# Patient Record
Sex: Female | Born: 1965 | Race: White | Hispanic: No | Marital: Married | Smoking: Current every day smoker
Health system: Southern US, Community
[De-identification: ages and names within clinical notes are randomized; demographics above are authoritative.]

## PROBLEM LIST (undated history)

## (undated) DIAGNOSIS — D649 Anemia, unspecified: Secondary | ICD-10-CM

## (undated) DIAGNOSIS — E079 Disorder of thyroid, unspecified: Secondary | ICD-10-CM

## (undated) DIAGNOSIS — F32A Depression, unspecified: Secondary | ICD-10-CM

## (undated) DIAGNOSIS — I456 Pre-excitation syndrome: Secondary | ICD-10-CM

## (undated) DIAGNOSIS — K589 Irritable bowel syndrome without diarrhea: Secondary | ICD-10-CM

## (undated) DIAGNOSIS — K529 Noninfective gastroenteritis and colitis, unspecified: Secondary | ICD-10-CM

## (undated) DIAGNOSIS — F329 Major depressive disorder, single episode, unspecified: Secondary | ICD-10-CM

## (undated) DIAGNOSIS — M549 Dorsalgia, unspecified: Secondary | ICD-10-CM

## (undated) DIAGNOSIS — E271 Primary adrenocortical insufficiency: Secondary | ICD-10-CM

## (undated) DIAGNOSIS — N200 Calculus of kidney: Secondary | ICD-10-CM

## (undated) DIAGNOSIS — G8929 Other chronic pain: Secondary | ICD-10-CM

## (undated) DIAGNOSIS — Z765 Malingerer [conscious simulation]: Secondary | ICD-10-CM

## (undated) DIAGNOSIS — K859 Acute pancreatitis without necrosis or infection, unspecified: Secondary | ICD-10-CM

## (undated) DIAGNOSIS — A0472 Enterocolitis due to Clostridium difficile, not specified as recurrent: Secondary | ICD-10-CM

## (undated) DIAGNOSIS — F419 Anxiety disorder, unspecified: Secondary | ICD-10-CM

## (undated) DIAGNOSIS — K52831 Collagenous colitis: Secondary | ICD-10-CM

## (undated) DIAGNOSIS — E876 Hypokalemia: Secondary | ICD-10-CM

## (undated) HISTORY — PX: TUBAL LIGATION: SHX77

## (undated) HISTORY — PX: SPHINCTEROTOMY: SHX5279

## (undated) HISTORY — PX: LITHOTRIPSY: SUR834

## (undated) HISTORY — PX: CHOLECYSTECTOMY: SHX55

## (undated) HISTORY — PX: CARDIAC ELECTROPHYSIOLOGY MAPPING AND ABLATION: SHX1292

---

## 2008-04-18 ENCOUNTER — Emergency Department (HOSPITAL_BASED_OUTPATIENT_CLINIC_OR_DEPARTMENT_OTHER): Admission: EM | Admit: 2008-04-18 | Discharge: 2008-04-18 | Payer: Self-pay | Admitting: Emergency Medicine

## 2008-04-18 ENCOUNTER — Ambulatory Visit: Payer: Self-pay | Admitting: Diagnostic Radiology

## 2008-04-28 ENCOUNTER — Ambulatory Visit (HOSPITAL_COMMUNITY): Admission: RE | Admit: 2008-04-28 | Discharge: 2008-04-28 | Payer: Self-pay | Admitting: Urology

## 2008-05-25 ENCOUNTER — Ambulatory Visit: Payer: Self-pay | Admitting: Diagnostic Radiology

## 2008-05-25 ENCOUNTER — Emergency Department (HOSPITAL_BASED_OUTPATIENT_CLINIC_OR_DEPARTMENT_OTHER): Admission: EM | Admit: 2008-05-25 | Discharge: 2008-05-25 | Payer: Self-pay | Admitting: Emergency Medicine

## 2008-07-17 ENCOUNTER — Ambulatory Visit (HOSPITAL_BASED_OUTPATIENT_CLINIC_OR_DEPARTMENT_OTHER): Admission: RE | Admit: 2008-07-17 | Discharge: 2008-07-17 | Payer: Self-pay | Admitting: Urology

## 2008-08-03 ENCOUNTER — Emergency Department (HOSPITAL_BASED_OUTPATIENT_CLINIC_OR_DEPARTMENT_OTHER): Admission: EM | Admit: 2008-08-03 | Discharge: 2008-08-03 | Payer: Self-pay | Admitting: Emergency Medicine

## 2008-08-03 ENCOUNTER — Ambulatory Visit: Payer: Self-pay | Admitting: Radiology

## 2008-08-20 ENCOUNTER — Encounter (INDEPENDENT_AMBULATORY_CARE_PROVIDER_SITE_OTHER): Payer: Self-pay | Admitting: General Surgery

## 2008-08-20 ENCOUNTER — Ambulatory Visit (HOSPITAL_COMMUNITY): Admission: RE | Admit: 2008-08-20 | Discharge: 2008-08-22 | Payer: Self-pay | Admitting: General Surgery

## 2008-08-24 ENCOUNTER — Ambulatory Visit: Payer: Self-pay | Admitting: Radiology

## 2008-08-24 ENCOUNTER — Observation Stay (HOSPITAL_COMMUNITY): Admission: EM | Admit: 2008-08-24 | Discharge: 2008-08-26 | Payer: Self-pay | Admitting: Emergency Medicine

## 2008-08-24 ENCOUNTER — Encounter: Payer: Self-pay | Admitting: Emergency Medicine

## 2008-10-24 ENCOUNTER — Ambulatory Visit: Payer: Self-pay | Admitting: Diagnostic Radiology

## 2008-10-24 ENCOUNTER — Emergency Department (HOSPITAL_BASED_OUTPATIENT_CLINIC_OR_DEPARTMENT_OTHER): Admission: EM | Admit: 2008-10-24 | Discharge: 2008-10-24 | Payer: Self-pay | Admitting: Emergency Medicine

## 2008-10-27 ENCOUNTER — Ambulatory Visit: Payer: Self-pay | Admitting: Diagnostic Radiology

## 2008-10-27 ENCOUNTER — Emergency Department (HOSPITAL_BASED_OUTPATIENT_CLINIC_OR_DEPARTMENT_OTHER): Admission: EM | Admit: 2008-10-27 | Discharge: 2008-10-27 | Payer: Self-pay | Admitting: Emergency Medicine

## 2008-11-03 ENCOUNTER — Emergency Department (HOSPITAL_BASED_OUTPATIENT_CLINIC_OR_DEPARTMENT_OTHER): Admission: EM | Admit: 2008-11-03 | Discharge: 2008-11-03 | Payer: Self-pay | Admitting: Emergency Medicine

## 2008-11-03 ENCOUNTER — Ambulatory Visit: Payer: Self-pay | Admitting: Diagnostic Radiology

## 2008-11-30 ENCOUNTER — Emergency Department (HOSPITAL_BASED_OUTPATIENT_CLINIC_OR_DEPARTMENT_OTHER): Admission: EM | Admit: 2008-11-30 | Discharge: 2008-11-30 | Payer: Self-pay | Admitting: Emergency Medicine

## 2008-12-21 ENCOUNTER — Inpatient Hospital Stay (HOSPITAL_COMMUNITY): Admission: RE | Admit: 2008-12-21 | Discharge: 2008-12-24 | Payer: Self-pay | Admitting: Internal Medicine

## 2008-12-21 ENCOUNTER — Ambulatory Visit: Payer: Self-pay | Admitting: Diagnostic Radiology

## 2008-12-21 ENCOUNTER — Encounter: Payer: Self-pay | Admitting: Emergency Medicine

## 2009-01-08 ENCOUNTER — Ambulatory Visit: Payer: Self-pay | Admitting: Radiology

## 2009-01-08 ENCOUNTER — Emergency Department (HOSPITAL_BASED_OUTPATIENT_CLINIC_OR_DEPARTMENT_OTHER): Admission: EM | Admit: 2009-01-08 | Discharge: 2009-01-08 | Payer: Self-pay | Admitting: Emergency Medicine

## 2009-01-14 ENCOUNTER — Ambulatory Visit: Payer: Self-pay | Admitting: Family

## 2009-01-14 DIAGNOSIS — I456 Pre-excitation syndrome: Secondary | ICD-10-CM | POA: Insufficient documentation

## 2009-01-14 DIAGNOSIS — Z862 Personal history of diseases of the blood and blood-forming organs and certain disorders involving the immune mechanism: Secondary | ICD-10-CM

## 2009-01-14 DIAGNOSIS — R1013 Epigastric pain: Secondary | ICD-10-CM

## 2009-01-14 DIAGNOSIS — F411 Generalized anxiety disorder: Secondary | ICD-10-CM | POA: Insufficient documentation

## 2009-01-14 DIAGNOSIS — R634 Abnormal weight loss: Secondary | ICD-10-CM

## 2009-01-14 DIAGNOSIS — K2900 Acute gastritis without bleeding: Secondary | ICD-10-CM | POA: Insufficient documentation

## 2009-01-16 ENCOUNTER — Telehealth: Payer: Self-pay | Admitting: Family

## 2009-01-19 ENCOUNTER — Ambulatory Visit: Payer: Self-pay | Admitting: Internal Medicine

## 2009-01-20 ENCOUNTER — Emergency Department (HOSPITAL_COMMUNITY): Admission: EM | Admit: 2009-01-20 | Discharge: 2009-01-20 | Payer: Self-pay | Admitting: Emergency Medicine

## 2009-01-23 ENCOUNTER — Telehealth: Payer: Self-pay | Admitting: Family

## 2009-02-25 ENCOUNTER — Telehealth: Payer: Self-pay | Admitting: Family

## 2009-03-02 ENCOUNTER — Telehealth: Payer: Self-pay | Admitting: Family

## 2009-03-03 ENCOUNTER — Telehealth (INDEPENDENT_AMBULATORY_CARE_PROVIDER_SITE_OTHER): Payer: Self-pay | Admitting: *Deleted

## 2009-03-03 ENCOUNTER — Encounter: Payer: Self-pay | Admitting: Family

## 2009-03-05 ENCOUNTER — Encounter: Payer: Self-pay | Admitting: Family

## 2009-03-06 ENCOUNTER — Telehealth (INDEPENDENT_AMBULATORY_CARE_PROVIDER_SITE_OTHER): Payer: Self-pay | Admitting: *Deleted

## 2009-03-12 ENCOUNTER — Telehealth (INDEPENDENT_AMBULATORY_CARE_PROVIDER_SITE_OTHER): Payer: Self-pay | Admitting: *Deleted

## 2009-04-07 ENCOUNTER — Emergency Department (HOSPITAL_BASED_OUTPATIENT_CLINIC_OR_DEPARTMENT_OTHER): Admission: EM | Admit: 2009-04-07 | Discharge: 2009-04-07 | Payer: Self-pay | Admitting: Emergency Medicine

## 2009-06-04 ENCOUNTER — Telehealth (INDEPENDENT_AMBULATORY_CARE_PROVIDER_SITE_OTHER): Payer: Self-pay | Admitting: *Deleted

## 2009-10-09 ENCOUNTER — Emergency Department (HOSPITAL_BASED_OUTPATIENT_CLINIC_OR_DEPARTMENT_OTHER): Admission: EM | Admit: 2009-10-09 | Discharge: 2009-10-09 | Payer: Self-pay | Admitting: Emergency Medicine

## 2009-10-09 ENCOUNTER — Ambulatory Visit: Payer: Self-pay | Admitting: Interventional Radiology

## 2010-03-14 ENCOUNTER — Ambulatory Visit: Payer: Self-pay | Admitting: Family Medicine

## 2010-03-14 DIAGNOSIS — J209 Acute bronchitis, unspecified: Secondary | ICD-10-CM

## 2010-03-14 DIAGNOSIS — M940 Chondrocostal junction syndrome [Tietze]: Secondary | ICD-10-CM

## 2010-03-16 ENCOUNTER — Telehealth (INDEPENDENT_AMBULATORY_CARE_PROVIDER_SITE_OTHER): Payer: Self-pay | Admitting: *Deleted

## 2010-05-04 NOTE — Progress Notes (Signed)
Summary: refill-- sertraline  Phone Note Refill Request Message from:  Fax from Pharmacy on June 04, 2009 1:05 PM  Refills Requested: Medication #1:  sertraline hcl 100 mg tablet   Dosage confirmed as above?Dosage Confirmed   Brand Name Necessary? No   Supply Requested: 3 months   Last Refilled: 05/02/2009  Method Requested: Electronic Next Appointment Scheduled: none Initial call taken by: Roselle Locus,  June 04, 2009 1:06 PM  Follow-up for Phone Call        Left message on machine  to return call. Pt. can get #30 x no refills but will need f/u with [facility] and pap if not already done. Follow-up by: Mervin Kung CMA,  June 04, 2009 2:06 PM  Additional Follow-up for Phone Call Additional follow up Details #1::        Spoke with pt. at 9:15 a.m. and she stated that she is currently seeing another doctor. Rx was sent to [place] in error. Left message on voicemail at [employer] to contact pt. for correct pharmacy. Additional Follow-up by: Mervin Kung CMA,  June 05, 2009 9:31 AM

## 2010-05-04 NOTE — Letter (Signed)
Summary: Dismissal Activation Form, Certified Mail Receipt  Dismissal Activation Form, Certified Mail Receipt   Imported By: Maryln Gottron 04/23/2009 11:21:00  _____________________________________________________________________  External Attachment:    Type:   Image     Comment:   External Document

## 2010-05-06 NOTE — Letter (Signed)
Summary: Out of Work  [facility]  [address]   [place], [place] [address]   Phone: [phone]  Fax: [phone]    March 14, 2010   Employee:  Tad Moore [facility]    To Whom It May Concern:   For Medical reasons, please excuse the above named employee from work tomorrow.  If you need additional information, please feel free to contact our office.         Sincerely,    Donna Christen MD

## 2010-05-06 NOTE — Progress Notes (Signed)
  Phone Note Outgoing Call   Call placed by: Clemens Catholic LPN,  March 16, 2010 9:42 AM Call placed to: Patient Summary of Call: call back: called to F/U with pt. left message to call back if any questions or concerns. Initial call taken by: Clemens Catholic LPN,  March 16, 2010 9:43 AM

## 2010-05-06 NOTE — Assessment & Plan Note (Signed)
Summary: COLD/TM Room 5   Vital Signs:  Patient Profile:   45 Years Old Female CC:      Sutffy nose, runny nose, sore throat, cough x 6 days worse last 3 days Height:     63 inches Weight:      120 pounds O2 Sat:      100 % O2 treatment:    Room Air Temp:     98.6 degrees F oral Pulse rate:   87 / minute Pulse rhythm:   regular Resp:     16 per minute BP sitting:   110 / 77  (left arm) Cuff size:   regular  Vitals Entered By: Emilio Math (March 14, 2010 12:49 PM)                  Current Allergies (reviewed today): No known allergies History of Present Illness Chief Complaint: Sutffy nose, runny nose, sore throat, cough x 6 days worse last 3 days History of Present Illness:  Subjective: Patient complains of URI symptoms that started 6 days ago with sinus congestion. Three days ago she developed sore throat and cough.  She has had pneumonia in the past.  She smokes 1 pack per day  Cough is worse at night. No pleuritic pain, but has pain over the sternum with deep inspiration No wheezing + post-nasal drainage No sinus pain/pressure No itchy/red eyes No earache No hemoptysis ? SOB with activity + fever/chills to 102 initially No nausea No vomiting No abdominal pain No diarrhea No skin rashes + fatigue + myalgias No headache Used OTC meds without relief   Current Meds ZOLOFT 100 MG TABS (SERTRALINE HCL) 1 TAB by mouth once daily AZITHROMYCIN 250 MG TABS (AZITHROMYCIN) Two tabs by mouth on day 1, then 1 tab daily on days 2 through 5 BENZONATATE 200 MG CAPS (BENZONATATE) One by mouth hs as needed cough  REVIEW OF SYSTEMS Constitutional Symptoms      Denies fever, chills, night sweats, weight loss, weight gain, and fatigue.  Eyes       Denies change in vision, eye pain, eye discharge, glasses, contact lenses, and eye surgery. Ear/Nose/Throat/Mouth       Complains of frequent runny nose, sinus problems, sore throat, and hoarseness.      Denies hearing  loss/aids, change in hearing, ear pain, ear discharge, dizziness, frequent nose bleeds, and tooth pain or bleeding.  Respiratory       Complains of dry cough and shortness of breath.      Denies productive cough, wheezing, asthma, bronchitis, and emphysema/COPD.  Cardiovascular       Complains of chest pain and tires easily with exhertion.      Denies murmurs.    Gastrointestinal       Denies stomach pain, nausea/vomiting, diarrhea, constipation, blood in bowel movements, and indigestion. Genitourniary       Denies painful urination, kidney stones, and loss of urinary control. Neurological       Complains of headaches.      Denies paralysis, seizures, and fainting/blackouts. Musculoskeletal       Denies muscle pain, joint pain, joint stiffness, decreased range of motion, redness, swelling, muscle weakness, and gout.  Skin       Denies bruising, unusual mles/lumps or sores, and hair/skin or nail changes.  Psych       Denies mood changes, temper/anger issues, anxiety/stress, speech problems, depression, and sleep problems.  Past History:  Past Medical History: Unremarkable  Past Surgical History: Reviewed history  from 01/14/2009 and no changes required. 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  Extraocular movement is intact.  Conjunctivae are not inflamed.  Ears:  Canals normal.  Tympanic membranes normal.   Nose:  Normal septum.  Normal turbinates, mildly congested.   No sinus tenderness present.  Pharynx:  Normal  Neck:  Supple.  Slightly tender shotty anterior/posterior nodes are palpated bilaterally.  Lungs:  Clear to auscultation.  Breath  sounds are equal.  Chest:  Distinct tenderness over sternum Heart:  Regular rate and rhythm without murmurs, rubs, or gallops.  Abdomen:  Nontender without masses or hepatosplenomegaly.  Bowel sounds are present.  No CVA or flank tenderness.  Extremities:  No edema.   Rapid strep test negative  Assessment New Problems: ACUTE BRONCHITIS (ICD-466.0) COSTOCHONDRITIS, ACUTE (ICD-733.6)  SUSPECT VIRAL URI.  ? EARLY BACTERIAL BRONCHITIS  Plan New Medications/Changes: BENZONATATE 200 MG CAPS (BENZONATATE) One by mouth hs as needed cough  #12 x 0, 03/14/2010, Donna Christen MD AZITHROMYCIN 250 MG TABS (AZITHROMYCIN) Two tabs by mouth on day 1, then 1 tab daily on days 2 through 5  #6 tabs x 0, 03/14/2010, Donna Christen MD  New Orders: Pulse Oximetry (single measurment) [94760] Rapid Strep [16109] New Patient Level III [60454] Planning Comments:   Begin Z-pack, expectorant/decongestant, cough suppressant at bedtime.  Increase fluid intake.   May take Ibuprofen 200mg , 4 tabs every 8 hours with food for anterior chest discomfort.  Given a Water quality scientist patient information and instruction sheet on topic costochondritis Return for worsening symptoms  Followup with PCP if not improving 7 to 10 days  Recommend Tdap when well (flu shot is current)   The patient and/or caregiver has been counseled thoroughly with regard to medications prescribed including dosage, schedule, interactions, rationale for use, and possible side effects and they verbalize understanding.  Diagnoses and expected course of recovery discussed and will return if not improved as expected or if the condition worsens. Patient and/or caregiver verbalized understanding.  Prescriptions: BENZONATATE 200 MG CAPS (BENZONATATE) One by mouth hs as needed cough  #12 x 0   Entered and Authorized by:   Donna Christen MD   Signed by:   Donna Christen MD on 03/14/2010   Method used:   Print then Give to Patient   RxID:    [id number] AZITHROMYCIN 250 MG TABS (AZITHROMYCIN) Two tabs by mouth on day 1, then 1 tab daily on days 2 through 5  #6 tabs x 0   Entered and Authorized by:   Donna Christen MD   Signed by:   Donna Christen MD on 03/14/2010   Method used:   Print then Give to Patient   RxID:   [id number]   Patient Instructions: 1)  May use Mucinex D (guaifenesin with decongestant) twice daily for congestion, or plain Mucinex and Sudafed. 2)  Increase fluid intake, rest. 3)  May take Ibuprofen 200mg , 4 tabs every 8 hours with food for chest pain and sore throat. 4)  May use Afrin nasal spray (or generic oxymetazoline) twice daily for about 5 days.  Also recommend using saline nasal spray several times daily and/or saline nasal irrigation. 5)  Followup with family doctor if not improving  7 to 10 days.  Orders Added: 1)  Pulse Oximetry (single measurment) [94760] 2)  Rapid Strep [09811] 3)  New Patient Level III [91478]  Appended Document: COLD/TM Room 5 Rapid Strep: Neg

## 2010-06-20 ENCOUNTER — Encounter: Payer: Self-pay | Admitting: Family Medicine

## 2010-06-20 ENCOUNTER — Ambulatory Visit
Admission: RE | Admit: 2010-06-20 | Discharge: 2010-06-20 | Disposition: A | Discharge status: Home / Self Care | Payer: Self-pay | Source: Ambulatory Visit | Attending: Family Medicine | Admitting: Family Medicine

## 2010-06-20 ENCOUNTER — Other Ambulatory Visit: Payer: Self-pay | Admitting: Family Medicine

## 2010-06-20 ENCOUNTER — Inpatient Hospital Stay (INDEPENDENT_AMBULATORY_CARE_PROVIDER_SITE_OTHER)
Admission: RE | Admit: 2010-06-20 | Discharge: 2010-06-20 | Disposition: A | Discharge status: Home / Self Care | Payer: Self-pay | Source: Ambulatory Visit | Attending: Family Medicine | Admitting: Family Medicine

## 2010-06-20 DIAGNOSIS — F329 Major depressive disorder, single episode, unspecified: Secondary | ICD-10-CM | POA: Insufficient documentation

## 2010-06-20 DIAGNOSIS — S93409A Sprain of unspecified ligament of unspecified ankle, initial encounter: Secondary | ICD-10-CM

## 2010-06-20 DIAGNOSIS — M25579 Pain in unspecified ankle and joints of unspecified foot: Secondary | ICD-10-CM

## 2010-06-20 DIAGNOSIS — F3289 Other specified depressive episodes: Secondary | ICD-10-CM | POA: Insufficient documentation

## 2010-06-20 LAB — DIFFERENTIAL
Basophils Absolute: 0.5 10*3/uL — ABNORMAL HIGH (ref 0.0–0.1)
Basophils Relative: 2 % — ABNORMAL HIGH (ref 0–1)
Eosinophils Absolute: 0.3 10*3/uL (ref 0.0–0.7)
Eosinophils Relative: 1 % (ref 0–5)
Metamyelocytes Relative: 0 %
Monocytes Absolute: 0.3 10*3/uL (ref 0.1–1.0)
Monocytes Relative: 1 % — ABNORMAL LOW (ref 3–12)
Myelocytes: 0 %
Neutro Abs: 22.6 10*3/uL — ABNORMAL HIGH (ref 1.7–7.7)
Neutrophils Relative %: 83 % — ABNORMAL HIGH (ref 43–77)

## 2010-06-20 LAB — CBC
MCHC: 34.3 g/dL (ref 30.0–36.0)
Platelets: 427 10*3/uL — ABNORMAL HIGH (ref 150–400)
RDW: 12.3 % (ref 11.5–15.5)

## 2010-06-20 LAB — COMPREHENSIVE METABOLIC PANEL
ALT: 23 U/L (ref 0–35)
AST: 28 U/L (ref 0–37)
Calcium: 9.8 mg/dL (ref 8.4–10.5)
Creatinine, Ser: 0.7 mg/dL (ref 0.4–1.2)
GFR calc Af Amer: >60 mL/min (ref >60)
Sodium: 146 mEq/L — ABNORMAL HIGH (ref 135–145)
Total Protein: 8.3 g/dL (ref 6.0–8.3)

## 2010-06-20 LAB — URINALYSIS, ROUTINE W REFLEX MICROSCOPIC
Bilirubin Urine: NEGATIVE
Glucose, UA: NEGATIVE mg/dL
Leukocytes, UA: NEGATIVE
Nitrite: NEGATIVE
Protein, ur: NEGATIVE mg/dL
Specific Gravity, Urine: 1.017 (ref 1.005–1.030)

## 2010-06-20 LAB — BASIC METABOLIC PANEL
BUN: 10 mg/dL (ref 6–23)
CO2: 23 mEq/L (ref 19–32)
Chloride: 101 mEq/L (ref 96–112)
Creatinine, Ser: 0.7 mg/dL (ref 0.4–1.2)
Glucose, Bld: 91 mg/dL (ref 70–99)

## 2010-06-20 LAB — HEMOCCULT GUIAC POC 1CARD (OFFICE): Fecal Occult Bld: NEGATIVE

## 2010-06-20 LAB — LACTIC ACID, PLASMA: Lactic Acid, Venous: 2.4 mmol/L — ABNORMAL HIGH (ref 0.5–2.2)

## 2010-06-25 ENCOUNTER — Encounter: Payer: Self-pay | Admitting: Family Medicine

## 2010-06-25 ENCOUNTER — Telehealth (INDEPENDENT_AMBULATORY_CARE_PROVIDER_SITE_OTHER): Payer: Self-pay | Admitting: *Deleted

## 2010-07-01 NOTE — Letter (Signed)
Summary: Out of Work  [facility]  [address]   [place], [place] [address]   Phone: [phone]  Fax: [phone]    June 20, 2010   Employee:  Tad Moore [facility] [employer]    To Whom It May Concern:   For Medical reasons,  Tad Moore must stay off her feet and use crutches for one week.  She should elevate her left leg and foot whenever possible.    If you need additional information, please feel free to contact our office.         Sincerely,    Donna Christen MD

## 2010-07-01 NOTE — Letter (Signed)
Summary: AUTHORIZATION [facility]  [employer] [facility] [facility]   Imported By: Dannette Barbara 06/25/2010 14:40:09  _____________________________________________________________________  External Attachment:    Type:   Image     Comment:   External Document

## 2010-07-01 NOTE — Assessment & Plan Note (Signed)
Summary: foot injury/TM rm 5   Vital Signs:  Patient Profile:   45 Years Old Female CC:      LT ankle pain Height:     63 inches O2 Sat:      98 % O2 treatment:    Room Air Temp:     98.0 degrees F oral Pulse rate:   105 / minute Resp:     16 per minute BP sitting:   130 / 90  (left arm) Cuff size:   regular  Vitals Entered By: Clemens Catholic LPN (June 20, 2010 2:40 PM)                  Updated Prior Medication List: ZOLOFT 100 MG TABS (SERTRALINE HCL) 1 TAB by mouth once daily  Current Allergies (reviewed today): No known allergies History of Present Illness Chief Complaint: LT ankle pain History of Present Illness:  Subjective:  Patient complains of tripping over puppies on stairs last night and twisting her left ankle.  She has had persistent pain/swelling and pain with weight-bearing.  REVIEW OF SYSTEMS Constitutional Symptoms      Denies fever, chills, night sweats, weight loss, weight gain, and fatigue.  Eyes       Denies change in vision, eye pain, eye discharge, glasses, contact lenses, and eye surgery. Ear/Nose/Throat/Mouth       Denies hearing loss/aids, change in hearing, ear pain, ear discharge, dizziness, frequent runny nose, frequent nose bleeds, sinus problems, sore throat, hoarseness, and tooth pain or bleeding.  Respiratory       Denies dry cough, productive cough, wheezing, shortness of breath, asthma, bronchitis, and emphysema/COPD.  Cardiovascular       Denies murmurs, chest pain, and tires easily with exhertion.    Gastrointestinal       Denies stomach pain, nausea/vomiting, diarrhea, constipation, blood in bowel movements, and indigestion. Genitourniary       Denies painful urination, kidney stones, and loss of urinary control. Neurological       Denies paralysis, seizures, and fainting/blackouts. Musculoskeletal       Complains of joint pain and swelling.      Denies muscle pain, joint stiffness, decreased range of motion, redness, muscle  weakness, and gout.  Skin       Complains of bruising.      Denies unusual mles/lumps or sores and hair/skin or nail changes.  Psych       Denies mood changes, temper/anger issues, anxiety/stress, speech problems, depression, and sleep problems. Other Comments: pt states that she fell down the steps in her garage last night, she c/o LT ankle pain, swelling and bruising. she has applied ice and taken OTC Motrin.   Past History:  Past Medical History:  Depression  Past Surgical History: Reviewed history from 01/14/2009 and no changes required. broken jaw 1985 tubal ligation 1998 radiocatheter ablation  gallbladder 2010  Family History: Reviewed history from 03/14/2010 and no changes required. Mother, Healthy Father, Loreli Slot  Social History: Reviewed history from 03/14/2010 and no changes required. 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  Tenderness and swelling over the lateral malleolus.  Joint stable.  No tenderness over the base of the fifth  metatarsal.  Distal neurovascular intact.  X-ray left ankle:   IMPRESSION: No acute osseous abnormality.  Ankle soft tissue swelling. Assessment New Problems: ANKLE SPRAIN, LEFT (ICD-845.00) DEPRESSION (ICD-311)   Plan New Medications/Changes:  LORTAB 5 5-500 MG TABS (HYDROCODONE-ACETAMINOPHEN) One or two tabs by mouth hs as needed pain  #12 (twelve) x 0, 06/20/2010, Donna Christen MD  New Orders: T-DG Ankle Complete*L* [73610] Aircast Ankle Brace [L4350] Ace  Bandage < 3in. [W2956] Crutches [E0110] Crutches fitting and training [97760] Est. Patient Level III [21308] Planning Comments:    Apply ice pack for 30 minutes every 1 to 2 hours today and tomorrow.  Elevate.  Use crutches for 3 to 5 days.  Wear Ace wrap until swelling decreases.  Wear brace for about 2 to 3 weeks.  Begin exercises in about 5 days as per instruction  sheet ([facility] information and instruction patient handout given)  If not improved 10 to 14 days follow-up with sports med clinic.   The patient and/or caregiver has been counseled thoroughly with regard to medications prescribed including dosage, schedule, interactions, rationale for use, and possible side effects and they verbalize understanding.  Diagnoses and expected course of recovery discussed and will return if not improved as expected or if the condition worsens. Patient and/or caregiver verbalized understanding.  Prescriptions: LORTAB 5 5-500 MG TABS (HYDROCODONE-ACETAMINOPHEN) One or two tabs by mouth hs as needed pain  #12 (twelve) x 0   Entered and Authorized by:   Donna Christen MD   Signed by:   Donna Christen MD on 06/20/2010   Method used:   Print then Give to Patient   RxID:   [id number]   Orders Added: 1)  T-DG Ankle Complete*L* [24401] 2)  Aircast Ankle Brace [L4350] 3)  Ace  Bandage < 3in. [U2725] 4)  Crutches [E0110] 5)  Crutches fitting and training [97760] 6)  Est. Patient Level III [36644]

## 2010-07-01 NOTE — Progress Notes (Signed)
  Phone Note Outgoing Call Call back at [facility] Phone [phone]   Call placed by: Emilio Math,  June 25, 2010 2:35 PM Call placed to: Patient Summary of Call: Left msg hope her ankle's better

## 2010-07-08 LAB — COMPREHENSIVE METABOLIC PANEL
ALT: 14 U/L (ref 0–35)
AST: 22 U/L (ref 0–37)
Albumin: 3.5 g/dL (ref 3.5–5.2)
Alkaline Phosphatase: 126 U/L — ABNORMAL HIGH (ref 39–117)
CO2: 24 mEq/L (ref 19–32)
CO2: 26 mEq/L (ref 19–32)
Calcium: 8.4 mg/dL (ref 8.4–10.5)
Calcium: 9.5 mg/dL (ref 8.4–10.5)
Chloride: 104 mEq/L (ref 96–112)
Creatinine, Ser: 0.64 mg/dL (ref 0.4–1.2)
GFR calc Af Amer: >60 mL/min (ref >60)
GFR calc non Af Amer: >60 mL/min (ref >60)
GFR calc non Af Amer: >60 mL/min (ref >60)
Glucose, Bld: 102 mg/dL — ABNORMAL HIGH (ref 70–99)
Potassium: 3.3 mEq/L — ABNORMAL LOW (ref 3.5–5.1)
Sodium: 139 mEq/L (ref 135–145)
Total Bilirubin: 0.5 mg/dL (ref 0.3–1.2)

## 2010-07-08 LAB — DIFFERENTIAL
Basophils Absolute: 0.3 10*3/uL — ABNORMAL HIGH (ref 0.0–0.1)
Basophils Relative: 3 % — ABNORMAL HIGH (ref 0–1)
Eosinophils Absolute: 0.4 10*3/uL (ref 0.0–0.7)
Eosinophils Relative: 2 % (ref 0–5)
Eosinophils Relative: 3 % (ref 0–5)
Lymphocytes Relative: 23 % (ref 12–46)
Lymphs Abs: 1.8 10*3/uL (ref 0.7–4.0)
Neutrophils Relative %: 72 % (ref 43–77)

## 2010-07-08 LAB — LIPASE, BLOOD
Lipase: 20 U/L (ref 11–59)
Lipase: 57 U/L (ref 23–300)

## 2010-07-08 LAB — URINALYSIS, ROUTINE W REFLEX MICROSCOPIC
Bilirubin Urine: NEGATIVE
Hgb urine dipstick: NEGATIVE
Ketones, ur: NEGATIVE mg/dL
Nitrite: NEGATIVE
Urobilinogen, UA: 0.2 mg/dL (ref 0.0–1.0)

## 2010-07-08 LAB — CBC
Hemoglobin: 14.3 g/dL (ref 12.0–15.0)
MCHC: 34.3 g/dL (ref 30.0–36.0)
MCHC: 33.7 g/dL (ref 30.0–36.0)
MCV: 104.3 fL — ABNORMAL HIGH (ref 78.0–100.0)
Platelets: 231 10*3/uL (ref 150–400)
RBC: 4.1 MIL/uL (ref 3.87–5.11)
WBC: 12.8 10*3/uL — ABNORMAL HIGH (ref 4.0–10.5)

## 2010-07-08 LAB — PREGNANCY, URINE: Preg Test, Ur: NEGATIVE

## 2010-07-09 LAB — COMPREHENSIVE METABOLIC PANEL
ALT: 93 U/L — ABNORMAL HIGH (ref 0–35)
AST: 159 U/L — ABNORMAL HIGH (ref 0–37)
Albumin: 3.3 g/dL — ABNORMAL LOW (ref 3.5–5.2)
Alkaline Phosphatase: 166 U/L — ABNORMAL HIGH (ref 39–117)
BUN: 9 mg/dL (ref 6–23)
CO2: 27 mEq/L (ref 19–32)
Calcium: 8.6 mg/dL (ref 8.4–10.5)
Calcium: 9.6 mg/dL (ref 8.4–10.5)
Chloride: 112 mEq/L (ref 96–112)
Creatinine, Ser: 0.55 mg/dL (ref 0.4–1.2)
Creatinine, Ser: 0.7 mg/dL (ref 0.4–1.2)
GFR calc Af Amer: >60 mL/min (ref >60)
GFR calc Af Amer: >60 mL/min (ref >60)
GFR calc non Af Amer: >60 mL/min (ref >60)
GFR calc non Af Amer: >60 mL/min (ref >60)
Glucose, Bld: 102 mg/dL — ABNORMAL HIGH (ref 70–99)
Glucose, Bld: 87 mg/dL (ref 70–99)
Potassium: 4.1 mEq/L (ref 3.5–5.1)
Sodium: 141 mEq/L (ref 135–145)
Total Bilirubin: 0.8 mg/dL (ref 0.3–1.2)
Total Protein: 5.5 g/dL — ABNORMAL LOW (ref 6.0–8.3)
Total Protein: 5.8 g/dL — ABNORMAL LOW (ref 6.0–8.3)
Total Protein: 7.9 g/dL (ref 6.0–8.3)

## 2010-07-09 LAB — POCT CARDIAC MARKERS
CKMB, poc: 3.2 ng/mL (ref 1.0–8.0)
Myoglobin, poc: 40.8 ng/mL (ref 12–200)
Troponin i, poc: <0.05 ng/mL (ref 0.00–0.09)

## 2010-07-09 LAB — CARDIAC PANEL(CRET KIN+CKTOT+MB+TROPI)
CK, MB: 3.3 ng/mL (ref 0.3–4.0)
Relative Index: INVALID (ref 0.0–2.5)
Relative Index: INVALID (ref 0.0–2.5)
Total CK: 73 U/L (ref 7–177)
Total CK: 79 U/L (ref 7–177)
Troponin I: 0.01 ng/mL (ref 0.00–0.06)

## 2010-07-09 LAB — CBC
Hemoglobin: 12 g/dL (ref 12.0–15.0)
Hemoglobin: 12 g/dL (ref 12.0–15.0)
Hemoglobin: 14.3 g/dL (ref 12.0–15.0)
MCHC: 34.2 g/dL (ref 30.0–36.0)
MCHC: 34.7 g/dL (ref 30.0–36.0)
MCV: 104.3 fL — ABNORMAL HIGH (ref 78.0–100.0)
MCV: 104.9 fL — ABNORMAL HIGH (ref 78.0–100.0)
Platelets: 156 10*3/uL (ref 150–400)
RBC: 3.32 MIL/uL — ABNORMAL LOW (ref 3.87–5.11)
RBC: 3.34 MIL/uL — ABNORMAL LOW (ref 3.87–5.11)
RDW: 12.9 % (ref 11.5–15.5)
RDW: 13 % (ref 11.5–15.5)
RDW: 13.3 % (ref 11.5–15.5)
WBC: 7.6 10*3/uL (ref 4.0–10.5)
WBC: 8.5 10*3/uL (ref 4.0–10.5)

## 2010-07-09 LAB — URINALYSIS, ROUTINE W REFLEX MICROSCOPIC
Bilirubin Urine: NEGATIVE
Ketones, ur: NEGATIVE mg/dL
Nitrite: NEGATIVE
Protein, ur: NEGATIVE mg/dL
Urobilinogen, UA: 0.2 mg/dL (ref 0.0–1.0)
pH: 6 (ref 5.0–8.0)

## 2010-07-09 LAB — AMYLASE: Amylase: 71 U/L (ref 27–131)

## 2010-07-09 LAB — DIFFERENTIAL
Eosinophils Absolute: 0.6 10*3/uL (ref 0.0–0.7)
Lymphocytes Relative: 18 % (ref 12–46)
Lymphs Abs: 2.8 10*3/uL (ref 0.7–4.0)
Monocytes Relative: 6 % (ref 3–12)
Neutrophils Relative %: 71 % (ref 43–77)

## 2010-07-09 LAB — CLOSTRIDIUM DIFFICILE EIA
C difficile Toxins A+B, EIA: NEGATIVE
C difficile Toxins A+B, EIA: NEGATIVE

## 2010-07-09 LAB — LIPASE, BLOOD
Lipase: 21 U/L (ref 11–59)
Lipase: 419 U/L — ABNORMAL HIGH (ref 23–300)

## 2010-07-09 LAB — HEPATIC FUNCTION PANEL
ALT: 98 U/L — ABNORMAL HIGH (ref 0–35)
AST: 30 U/L (ref 0–37)
Albumin: 3.2 g/dL — ABNORMAL LOW (ref 3.5–5.2)
Alkaline Phosphatase: 144 U/L — ABNORMAL HIGH (ref 39–117)
Total Protein: 5.5 g/dL — ABNORMAL LOW (ref 6.0–8.3)

## 2010-07-09 LAB — TISSUE TRANSGLUTAMINASE, IGG: Tissue Transglut Ab: 0 U/mL (ref <7)

## 2010-07-09 LAB — BASIC METABOLIC PANEL
CO2: 21 mEq/L (ref 19–32)
Calcium: 8.1 mg/dL — ABNORMAL LOW (ref 8.4–10.5)
Creatinine, Ser: 0.55 mg/dL (ref 0.4–1.2)
GFR calc Af Amer: >60 mL/min (ref >60)
GFR calc non Af Amer: >60 mL/min (ref >60)
Sodium: 137 mEq/L (ref 135–145)

## 2010-07-09 LAB — TISSUE TRANSGLUTAMINASE, IGA: Tissue Transglutaminase Ab, IgA: 0.1 U/mL (ref <7)

## 2010-07-10 LAB — URINALYSIS, ROUTINE W REFLEX MICROSCOPIC
Glucose, UA: NEGATIVE mg/dL
Specific Gravity, Urine: 1.017 (ref 1.005–1.030)
Urobilinogen, UA: 0.2 mg/dL (ref 0.0–1.0)
pH: 6 (ref 5.0–8.0)

## 2010-07-10 LAB — DIFFERENTIAL
Lymphocytes Relative: 11 % — ABNORMAL LOW (ref 12–46)
Lymphs Abs: 1.5 10*3/uL (ref 0.7–4.0)
Monocytes Absolute: 0.3 10*3/uL (ref 0.1–1.0)
Monocytes Absolute: 0.5 10*3/uL (ref 0.1–1.0)
Monocytes Relative: 2 % — ABNORMAL LOW (ref 3–12)
Monocytes Relative: 6 % (ref 3–12)
Neutro Abs: 11.6 10*3/uL — ABNORMAL HIGH (ref 1.7–7.7)
Neutro Abs: 6.1 10*3/uL (ref 1.7–7.7)
Neutrophils Relative %: 71 % (ref 43–77)

## 2010-07-10 LAB — COMPREHENSIVE METABOLIC PANEL
Albumin: 4.1 g/dL (ref 3.5–5.2)
Alkaline Phosphatase: 264 U/L — ABNORMAL HIGH (ref 39–117)
BUN: 10 mg/dL (ref 6–23)
BUN: 12 mg/dL (ref 6–23)
CO2: 26 mEq/L (ref 19–32)
Chloride: 110 mEq/L (ref 96–112)
Creatinine, Ser: 0.7 mg/dL (ref 0.4–1.2)
Creatinine, Ser: 0.7 mg/dL (ref 0.4–1.2)
GFR calc Af Amer: >60 mL/min (ref >60)
GFR calc non Af Amer: >60 mL/min (ref >60)
Potassium: 3.2 mEq/L — ABNORMAL LOW (ref 3.5–5.1)
Total Bilirubin: 0.1 mg/dL — ABNORMAL LOW (ref 0.3–1.2)
Total Protein: 7.3 g/dL (ref 6.0–8.3)

## 2010-07-10 LAB — URINE MICROSCOPIC-ADD ON

## 2010-07-10 LAB — CBC
HCT: 40.7 % (ref 36.0–46.0)
HCT: 37.4 % (ref 36.0–46.0)
MCV: 106.9 fL — ABNORMAL HIGH (ref 78.0–100.0)
Platelets: 338 10*3/uL (ref 150–400)
RBC: 3.5 MIL/uL — ABNORMAL LOW (ref 3.87–5.11)
RDW: 11.9 % (ref 11.5–15.5)
WBC: 8.6 10*3/uL (ref 4.0–10.5)

## 2010-07-11 LAB — DIFFERENTIAL
Basophils Absolute: 0 10*3/uL (ref 0.0–0.1)
Basophils Absolute: 0.2 10*3/uL — ABNORMAL HIGH (ref 0.0–0.1)
Basophils Relative: 0 % (ref 0–1)
Eosinophils Absolute: 0.4 10*3/uL (ref 0.0–0.7)
Eosinophils Relative: 4 % (ref 0–5)
Eosinophils Relative: 7 % — ABNORMAL HIGH (ref 0–5)
Lymphocytes Relative: 27 % (ref 12–46)
Monocytes Absolute: 0.3 10*3/uL (ref 0.1–1.0)
Monocytes Relative: 4 % (ref 3–12)
Neutro Abs: 6.4 10*3/uL (ref 1.7–7.7)
Neutrophils Relative %: 59 % (ref 43–77)

## 2010-07-11 LAB — COMPREHENSIVE METABOLIC PANEL
ALT: 87 U/L — ABNORMAL HIGH (ref 0–35)
AST: 24 U/L (ref 0–37)
AST: 38 U/L — ABNORMAL HIGH (ref 0–37)
Albumin: 3.7 g/dL (ref 3.5–5.2)
Alkaline Phosphatase: 132 U/L — ABNORMAL HIGH (ref 39–117)
BUN: 10 mg/dL (ref 6–23)
BUN: 13 mg/dL (ref 6–23)
CO2: 27 mEq/L (ref 19–32)
Chloride: 104 mEq/L (ref 96–112)
Chloride: 108 mEq/L (ref 96–112)
Creatinine, Ser: 0.7 mg/dL (ref 0.4–1.2)
GFR calc Af Amer: >60 mL/min (ref >60)
GFR calc non Af Amer: >60 mL/min (ref >60)
Glucose, Bld: 80 mg/dL (ref 70–99)
Potassium: 2.6 mEq/L — CL (ref 3.5–5.1)
Sodium: 140 mEq/L (ref 135–145)
Total Bilirubin: 0.1 mg/dL — ABNORMAL LOW (ref 0.3–1.2)
Total Bilirubin: 0.2 mg/dL — ABNORMAL LOW (ref 0.3–1.2)
Total Protein: 6.5 g/dL (ref 6.0–8.3)

## 2010-07-11 LAB — CBC
HCT: 35.4 % — ABNORMAL LOW (ref 36.0–46.0)
HCT: 36.8 % (ref 36.0–46.0)
Hemoglobin: 13.1 g/dL (ref 12.0–15.0)
MCHC: 35.7 g/dL (ref 30.0–36.0)
MCV: 105.3 fL — ABNORMAL HIGH (ref 78.0–100.0)
Platelets: 213 10*3/uL (ref 150–400)
RBC: 3.49 MIL/uL — ABNORMAL LOW (ref 3.87–5.11)
RDW: 12.8 % (ref 11.5–15.5)
WBC: 9.2 10*3/uL (ref 4.0–10.5)
WBC: 9.3 10*3/uL (ref 4.0–10.5)

## 2010-07-11 LAB — URINALYSIS, ROUTINE W REFLEX MICROSCOPIC
Hgb urine dipstick: NEGATIVE
Ketones, ur: NEGATIVE mg/dL
Nitrite: NEGATIVE

## 2010-07-11 LAB — D-DIMER, QUANTITATIVE: D-Dimer, Quant: 0.22 ug/mL-FEU (ref 0.00–0.48)

## 2010-07-11 LAB — URINE MICROSCOPIC-ADD ON

## 2010-07-11 LAB — LIPASE, BLOOD: Lipase: 22 U/L — ABNORMAL LOW (ref 23–300)

## 2010-07-13 LAB — HEPATIC FUNCTION PANEL
ALT: 44 U/L — ABNORMAL HIGH (ref 0–35)
AST: 64 U/L — ABNORMAL HIGH (ref 0–37)
Albumin: 3.2 g/dL — ABNORMAL LOW (ref 3.5–5.2)
Alkaline Phosphatase: 67 U/L (ref 39–117)
Bilirubin, Direct: 0.1 mg/dL (ref 0.0–0.3)
Total Protein: 4.8 g/dL — ABNORMAL LOW (ref 6.0–8.3)
Total Protein: 5.4 g/dL — ABNORMAL LOW (ref 6.0–8.3)

## 2010-07-13 LAB — COMPREHENSIVE METABOLIC PANEL
ALT: 11 U/L (ref 0–35)
ALT: 35 U/L (ref 0–35)
AST: 74 U/L — ABNORMAL HIGH (ref 0–37)
AST: 15 U/L (ref 0–37)
Albumin: 2.7 g/dL — ABNORMAL LOW (ref 3.5–5.2)
Alkaline Phosphatase: 71 U/L (ref 39–117)
BUN: 14 mg/dL (ref 6–23)
BUN: 8 mg/dL (ref 6–23)
CO2: 26 mEq/L (ref 19–32)
CO2: 26 mEq/L (ref 19–32)
Calcium: 8 mg/dL — ABNORMAL LOW (ref 8.4–10.5)
Calcium: 8 mg/dL — ABNORMAL LOW (ref 8.4–10.5)
Chloride: 103 mEq/L (ref 96–112)
Chloride: 110 mEq/L (ref 96–112)
Chloride: 111 mEq/L (ref 96–112)
Creatinine, Ser: 0.5 mg/dL (ref 0.4–1.2)
Creatinine, Ser: 0.42 mg/dL (ref 0.4–1.2)
Creatinine, Ser: 0.51 mg/dL (ref 0.4–1.2)
GFR calc Af Amer: >60 mL/min (ref >60)
GFR calc Af Amer: >60 mL/min (ref >60)
GFR calc non Af Amer: >60 mL/min (ref >60)
GFR calc non Af Amer: >60 mL/min (ref >60)
GFR calc non Af Amer: >60 mL/min (ref >60)
Glucose, Bld: 102 mg/dL — ABNORMAL HIGH (ref 70–99)
Glucose, Bld: 92 mg/dL (ref 70–99)
Potassium: 4 mEq/L (ref 3.5–5.1)
Sodium: 142 mEq/L (ref 135–145)
Total Bilirubin: 0.1 mg/dL — ABNORMAL LOW (ref 0.3–1.2)
Total Bilirubin: 0.3 mg/dL (ref 0.3–1.2)
Total Bilirubin: 0.4 mg/dL (ref 0.3–1.2)
Total Protein: 4.5 g/dL — ABNORMAL LOW (ref 6.0–8.3)
Total Protein: 6.1 g/dL (ref 6.0–8.3)

## 2010-07-13 LAB — DIFFERENTIAL
Basophils Absolute: 0.1 10*3/uL (ref 0.0–0.1)
Basophils Absolute: 0.1 10*3/uL (ref 0.0–0.1)
Basophils Absolute: 0.1 10*3/uL (ref 0.0–0.1)
Basophils Relative: 0 % (ref 0–1)
Basophils Relative: 1 % (ref 0–1)
Eosinophils Absolute: 0.4 10*3/uL (ref 0.0–0.7)
Eosinophils Absolute: 0.5 10*3/uL (ref 0.0–0.7)
Eosinophils Relative: 3 % (ref 0–5)
Lymphocytes Relative: 21 % (ref 12–46)
Lymphocytes Relative: 26 % (ref 12–46)
Lymphs Abs: 1.2 10*3/uL (ref 0.7–4.0)
Lymphs Abs: 1.8 10*3/uL (ref 0.7–4.0)
Monocytes Absolute: 0.5 10*3/uL (ref 0.1–1.0)
Monocytes Relative: 3 % (ref 3–12)
Monocytes Relative: 7 % (ref 3–12)
Monocytes Relative: 5 % (ref 3–12)
Neutro Abs: 4.1 10*3/uL (ref 1.7–7.7)
Neutrophils Relative %: 56 % (ref 43–77)
Neutrophils Relative %: 82 % — ABNORMAL HIGH (ref 43–77)

## 2010-07-13 LAB — CBC
HCT: 33.8 % — ABNORMAL LOW (ref 36.0–46.0)
HCT: 30.5 % — ABNORMAL LOW (ref 36.0–46.0)
HCT: 37.6 % (ref 36.0–46.0)
HCT: 36.2 % (ref 36.0–46.0)
Hemoglobin: 10.6 g/dL — ABNORMAL LOW (ref 12.0–15.0)
Hemoglobin: 13.3 g/dL (ref 12.0–15.0)
Hemoglobin: 12.7 g/dL (ref 12.0–15.0)
MCHC: 34.5 g/dL (ref 30.0–36.0)
MCHC: 34.3 g/dL (ref 30.0–36.0)
MCHC: 35.1 g/dL (ref 30.0–36.0)
MCHC: 35 g/dL (ref 30.0–36.0)
MCV: 104.4 fL — ABNORMAL HIGH (ref 78.0–100.0)
MCV: 104.5 fL — ABNORMAL HIGH (ref 78.0–100.0)
MCV: 104.6 fL — ABNORMAL HIGH (ref 78.0–100.0)
Platelets: 257 10*3/uL (ref 150–400)
RBC: 2.9 MIL/uL — ABNORMAL LOW (ref 3.87–5.11)
RBC: 2.92 MIL/uL — ABNORMAL LOW (ref 3.87–5.11)
RBC: 3.52 MIL/uL — ABNORMAL LOW (ref 3.87–5.11)
RBC: 3.59 MIL/uL — ABNORMAL LOW (ref 3.87–5.11)
RDW: 12.2 % (ref 11.5–15.5)
WBC: 5.9 10*3/uL (ref 4.0–10.5)
WBC: 12.4 10*3/uL — ABNORMAL HIGH (ref 4.0–10.5)
WBC: 6 10*3/uL (ref 4.0–10.5)

## 2010-07-13 LAB — URINALYSIS, ROUTINE W REFLEX MICROSCOPIC
Bilirubin Urine: NEGATIVE
Glucose, UA: NEGATIVE mg/dL
Ketones, ur: NEGATIVE mg/dL
Nitrite: NEGATIVE
Nitrite: NEGATIVE
Protein, ur: NEGATIVE mg/dL
Specific Gravity, Urine: 1.02 (ref 1.005–1.030)
Specific Gravity, Urine: 1.031 — ABNORMAL HIGH (ref 1.005–1.030)
Urobilinogen, UA: 0.2 mg/dL (ref 0.0–1.0)
pH: 6 (ref 5.0–8.0)

## 2010-07-13 LAB — BASIC METABOLIC PANEL
CO2: 26 mEq/L (ref 19–32)
Chloride: 105 mEq/L (ref 96–112)
Creatinine, Ser: 0.55 mg/dL (ref 0.4–1.2)
GFR calc Af Amer: >60 mL/min (ref >60)
Glucose, Bld: 77 mg/dL (ref 70–99)
Potassium: 2.9 mEq/L — ABNORMAL LOW (ref 3.5–5.1)
Potassium: 3.1 mEq/L — ABNORMAL LOW (ref 3.5–5.1)
Sodium: 139 mEq/L (ref 135–145)
Sodium: 138 mEq/L (ref 135–145)

## 2010-07-13 LAB — LIPASE, BLOOD
Lipase: 158 U/L — ABNORMAL HIGH (ref 11–59)
Lipase: 20 U/L (ref 11–59)

## 2010-07-19 LAB — COMPREHENSIVE METABOLIC PANEL
ALT: 13 U/L (ref 0–35)
AST: 23 U/L (ref 0–37)
Albumin: 3.8 g/dL (ref 3.5–5.2)
Alkaline Phosphatase: 113 U/L (ref 39–117)
Calcium: 9.3 mg/dL (ref 8.4–10.5)
GFR calc Af Amer: >60 mL/min (ref >60)
Glucose, Bld: 112 mg/dL — ABNORMAL HIGH (ref 70–99)
Potassium: 2.5 mEq/L — CL (ref 3.5–5.1)
Sodium: 136 mEq/L (ref 135–145)
Total Protein: 6.8 g/dL (ref 6.0–8.3)

## 2010-07-19 LAB — DIFFERENTIAL
Basophils Relative: 2 % — ABNORMAL HIGH (ref 0–1)
Eosinophils Absolute: 0.3 10*3/uL (ref 0.0–0.7)
Eosinophils Relative: 3 % (ref 0–5)
Lymphs Abs: 2.5 10*3/uL (ref 0.7–4.0)
Monocytes Absolute: 0.7 10*3/uL (ref 0.1–1.0)
Monocytes Relative: 7 % (ref 3–12)

## 2010-07-19 LAB — GC/CHLAMYDIA PROBE AMP, GENITAL
Chlamydia, DNA Probe: NEGATIVE
GC Probe Amp, Genital: NEGATIVE

## 2010-07-19 LAB — CBC
Hemoglobin: 15.2 g/dL — ABNORMAL HIGH (ref 12.0–15.0)
MCHC: 34.6 g/dL (ref 30.0–36.0)
Platelets: 273 10*3/uL (ref 150–400)
RDW: 11.8 % (ref 11.5–15.5)

## 2010-07-19 LAB — URINALYSIS, ROUTINE W REFLEX MICROSCOPIC
Glucose, UA: NEGATIVE mg/dL
Ketones, ur: 15 mg/dL — AB
Specific Gravity, Urine: 1.019 (ref 1.005–1.030)
pH: 6.5 (ref 5.0–8.0)

## 2010-07-19 LAB — URINE MICROSCOPIC-ADD ON

## 2010-07-19 LAB — WET PREP, GENITAL: Yeast Wet Prep HPF POC: NONE SEEN

## 2010-07-20 LAB — URINALYSIS, ROUTINE W REFLEX MICROSCOPIC
Glucose, UA: NEGATIVE mg/dL
Hgb urine dipstick: NEGATIVE
Ketones, ur: 15 mg/dL — AB
Protein, ur: NEGATIVE mg/dL
Urobilinogen, UA: 0.2 mg/dL (ref 0.0–1.0)

## 2010-07-20 LAB — CBC
HCT: 39.8 % (ref 36.0–46.0)
MCV: 102 fL — ABNORMAL HIGH (ref 78.0–100.0)
Platelets: 348 10*3/uL (ref 150–400)
RDW: 12.3 % (ref 11.5–15.5)
WBC: 9.6 10*3/uL (ref 4.0–10.5)

## 2010-07-20 LAB — COMPREHENSIVE METABOLIC PANEL
BUN: 14 mg/dL (ref 6–23)
CO2: 26 mEq/L (ref 19–32)
Calcium: 9.3 mg/dL (ref 8.4–10.5)
Creatinine, Ser: 0.6 mg/dL (ref 0.4–1.2)
GFR calc non Af Amer: >60 mL/min (ref >60)
Glucose, Bld: 98 mg/dL (ref 70–99)
Sodium: 141 mEq/L (ref 135–145)
Total Protein: 7.5 g/dL (ref 6.0–8.3)

## 2010-07-20 LAB — DIFFERENTIAL
Lymphocytes Relative: 21 % (ref 12–46)
Lymphs Abs: 2 10*3/uL (ref 0.7–4.0)
Monocytes Relative: 5 % (ref 3–12)
Neutro Abs: 6.8 10*3/uL (ref 1.7–7.7)
Neutrophils Relative %: 70 % (ref 43–77)

## 2010-07-20 LAB — PREGNANCY, URINE: Preg Test, Ur: NEGATIVE

## 2010-08-17 NOTE — Op Note (Signed)
NAMETRACI, GAFFORD              ACCOUNT NO.:  [id number]   MEDICAL RECORD NO.:  [id number]          PATIENT TYPE:  AMB   LOCATION:  [facility]                         FACILITY:  [facility]   PHYSICIAN:  Maretta Bees. Vonita Moss, M.D.DATE OF BIRTH:  01/09/1966   DATE OF PROCEDURE:  07/17/2008  DATE OF DISCHARGE:                               OPERATIVE REPORT   PREOPERATIVE DIAGNOSIS:  Right ureteral calculus.   POSTOPERATIVE DIAGNOSIS:  Right ureteral calculus.   PROCEDURE:  Cystoscopy, right ureteroscopy, stone basketing, right  retrograde pyelogram and right double-J catheter placement.   SURGEON:  Maretta Bees. Vonita Moss, M.D.   ANESTHESIA:  General.   INDICATIONS:  This 45 year old lady has had several weeks of right lower  quadrant pain due to a small distal right ureteral stone.  Because of  persistent symptomatology and need to take hydrocodone and despite  taking Rapaflo she has not passed the stone.   PROCEDURE IN DETAIL:  The patient was brought to the operating room and  placed in lithotomy position.  External genitalia were prepped and  draped in the usual fashion.  She was cystoscoped and the bladder was  unremarkable.  Fluoroscopy was done and I thought I saw a small opacity  in the vicinity of the distal right ureter.  I then passed a guidewire  up the right ureter and looking back in it was obvious that the right  ureteral orifice was too small to accommodate the ureteroscope so I  dilated the intramural ureter with the inner core of an ureteral access  sheath.  I then inserted a 6 French rigid ureteroscope and did not see  any stone in the ureter but was fortunately able to go all the way up to  the kidney where I found the small yellow irregular calculus in the  renal pelvis.  I used a Nitinol stone basket to retrieve this stone  which was given to the patient postop.  I did a retrograde pyelogram  which showed some pyelocaliectasis and a small amount of intrarenal  extravasation  of contrast.  I felt it best that a double-J catheter be  placed and therefore over a guidewire inserted a 6 French 26 cm double-J  catheter with a full coil in the renal pelvis and a full coil in the  bladder.  I will leave that in a few days.  She was then taken to the  recovery room in good condition having tolerated the procedure well  after emptying the bladder.      Maretta Bees. Vonita Moss, M.D.  Electronically Signed     LJP/MEDQ  D:  07/17/2008  T:  07/17/2008  Job:  [id number]

## 2010-08-17 NOTE — Op Note (Signed)
NAMEEMBERLEY, Dawn Frost              ACCOUNT NO.:  [id number]   MEDICAL RECORD NO.:  [id number]          PATIENT TYPE:  OIB   LOCATION:  5127                         FACILITY:  [facility]   PHYSICIAN:  Almond Lint, MD       DATE OF BIRTH:  May 10, 1965   DATE OF PROCEDURE:  08/20/2008  DATE OF DISCHARGE:                               OPERATIVE REPORT   PREOPERATIVE DIAGNOSIS:  Symptomatic cholelithiasis.   POSTOPERATIVE DIAGNOSIS:  Symptomatic cholelithiasis.   PROCEDURE PERFORMED:  Laparoscopic cholecystectomy with intraoperative  cholangiogram.   SURGEON:  Almond Lint, MD   ASSISTANT:  None.   ANESTHESIA:  General and local.   FINDINGS:  Many omental adhesions to the gallbladder.  Normal  cholangiogram except for some leaking contrast through an incompletely  clipped cystic duct at the time of the cholangiogram.   SPECIMEN:  Gallbladder to Pathology.   ESTIMATED BLOOD LOSS:  Minimal.   COMPLICATIONS:  None known.   PROCEDURE:  Dawn Frost was identified in the holding area and taken to  the operating room where she was placed supine on the operating room  table.  General endotracheal anesthesia was induced.  Her abdomen was  prepped and draped in a sterile fashion.  Time-out was performed  according to the surgical safety check list.  When all was correct, we  continued.  The infraumbilical skin was anesthetized with a mixture of  0.25% Marcaine plain and 1% lidocaine with epinephrine.  A curvilinear  transverse incision was made with a #11 blade in this region.  The  subcutaneous skin was divided bluntly with a Kelly clamp.  The umbilical  stalk was elevated with the Kocher, and the midline fascia was incised  with a #11 blade.  A Kelly clamp was used to confirm entrance into the  abdominal cavity.  A pursestring suture was placed around the fascial  incision.  The Hasson trocar was then introduced into the fascial  incision and secured to the abdominal wall with the tails of  the suture.  Pneumoperitoneum was achieved to a pressure of .   The patient was placed into reverse Trendelenburg position and then  rotated to the left.  The epigastric region was identified, and an 11-mm  port was placed in the standard fashion after administration of local  anesthetic.  Two ports were placed in the right upper quadrant as well  in a similar fashion under direct visualization after administration of  local.  The gallbladder was noted to be extremely dilated.  The fundus  was grasped and elevated toward the head.  Omental adhesions were taken  down bluntly with locking grasper and a [place].  There were also  several adhesions to the liver that were taken down with cautery.  The  infundibulum was then grasped and retracted laterally.  The cystic duct  and artery were skeletonized.  They were two structures that appeared to  be arterial branches, and the cystic duct appeared to be slightly  dilated.  Therefore, a decision was made to perform a cholangiogram.  The clip was placed on the gallbladder side of  the cystic duct.  A  ductotomy was made with the scissors.  The catheter was advanced through  the abdominal wall and would not pass easily into the ductotomy because  of the angle.  This was moved further laterally and was able to be  secured into the duct without difficulty.  This was clipped in place,  and the saline flowed easily.  The patient was placed back into the  supine position.  The pneumoperitoneum was allowed to evacuate.  A  cholangiogram was shot demonstrating filling of the right and left  hepatic ducts as well as the common bile duct and the duodenum.  No  filling defects were seen.  There was some leakage present, and at  reexamination of the cystic duct the clip on the actual gallbladder side  had come out somewhat and there was bile leaking from the gallbladder  side.  The pneumoperitoneum was reachieved, and the clip was removed  holding the  cholangiogram catheter into place.  The patient was placed  back into reverse Trendelenburg position.   The cystic duct was clipped 3 times on the patient's side and once again  on the specimen side.  This was then transected.  The 2 structures  appearing to be small arteries, one of them had completely thinned out  and it was clear that this was not a tubular structure.  This was taken  with the Bovie.  The cystic artery was then clipped twice with the clip  applier on the patient's side and once of the specimen side.  This was  then ligated with Endoshears.  The gallbladder was then taken off the  gallbladder fossa with the electrocautery.  The camera was then moved to  the epigastric port, and the EndoCatch bag was used to retrieve the  gallbladder through the umbilical port.  This was done easily.  Pneumoperitoneum was reachieved, and the camera placed back into the  umbilical position.  The gallbladder fossa was examined, and there had  been some bile staining from where the bile had leaked out of the  gallbladder.  This was copiously irrigated until it returned clear.  The  clips in the gallbladder fossa were reexamined, and there was no note of  any biliary of leakage or bleeding in the gallbladder fossa.  Right  upper quadrant and epigastric ports were removed under direct  visualization with no bleeding seen.  Pneumoperitoneum was then allowed  to completely evacuate, and the fascial incision was closed with a  pursestring suture.  There was no defect seen or palpated.  The skin of  all these incisions was closed with the 4-0 Monocryl in a subcuticular  fashion.  Needle and sponge counts were correct x2.  The patient was  awakened out of anesthesia and taken to the PACU in stable condition.      Almond Lint, MD  Electronically Signed     FB/MEDQ  D:  08/20/2008  T:  08/21/2008  Job:  [id number]

## 2010-08-17 NOTE — Discharge Summary (Signed)
Dawn Frost, Dawn Frost              ACCOUNT NO.:  [id number]   MEDICAL RECORD NO.:  [id number]          PATIENT TYPE:  INP   LOCATION:  5121                         FACILITY:  [facility]   PHYSICIAN:  Maisie Fus A. Cornett, M.D.DATE OF BIRTH:  11-22-1965   DATE OF ADMISSION:  08/24/2008  DATE OF DISCHARGE:  08/26/2008                               DISCHARGE SUMMARY   PRIMARY SURGEON:  Almond Lint, MD   PRIMARY CARE PHYSICIAN:  Dr. Lissa Morales at [facility].   CHIEF COMPLAINT REASON FOR ADMISSION:  [name] is a 45 year old female  patient who presented to the ER 4 days postop after laparoscopic  cholecystectomy.  She reported having some right-sided abdominal pain  that has increased in severity also epigastric pain associated with  nausea and vomiting.  No fevers at home.  She was passing bowel  movements, pain mainly on the right side, feels bloated but feels that  the bloating is getting better.  On initial exam, the patient's vital  signs were stable.  Her abdomen was soft with some moderate right-sided  abdominal pain without guarding, rigidity or peritonitis.  The patient's  white count was normal at 5900.  LFTs were normal.  The patient was  admitted with the following diagnosis.  Right-sided abdominal pain after cholecystectomy rule out biloma, rule  out bile leak.   HOSPITAL COURSE:  The patient was admitted, placed in 5100 unit n.p.o.  status IV fluids with serial labs to follow.  The patient subsequently  underwent a HIDA scan which was negative for bile leak complaining of  chest pain with inspiration.  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  Dr. Luisa Hart opted to  check an EKG on her.  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  In talking with the patient about any cardiac  symptoms she has no family history of cardiac disease.  She has no  exertional chest pain or dyspnea on exertion.  No chest pain relieved  with rest and no tachy palpitations.   Also the patient was noted to have a hemoglobin around 10.7 with an MCV  of 104.5.  The patient reports that she knew she had an anemia while she  was pregnant apparently took iron at that time but was unaware she had a  macrocytic anemia is not on medications now.  She follows with Dr. Lissa Morales  in [place] at [facility].  The patient reports that  since she began with her biliary colic symptoms she has lost a  considerable amount of weight and has recently been on vitamin D therapy  with Dr. Lissa Morales so I suggested she follow up with Dr. Lissa Morales regarding the  anemia.  We will also send copies of her EKG and  lab work with her so  Dr. Lissa Morales will have these and would like to have a copy of this  discharge summary sent to Dr. Lissa Morales as well.   DISCHARGE DIAGNOSES:  1. Right-sided and epigastric chest pain postoperatively a HIDA scan      negative for leak.  2. Macrocytic anemia, history of anemia in the past.  3. Anxiety and depression on Zoloft.  4. History of Wolff-Parkinson-White syndrome status post ablation with      EKG showing persistent finding of septal infarct.   DISCHARGE MEDICATIONS:  The patient will restart the following  medications she was taking prior to admission.  1. Zoloft 100 mg daily.  2. Ibuprofen 600 mg t.i.d.  3. Percocet 5/325 1-2 tablets every 4 hours needed for pain.  4. Phenergan 25 mg every 6 hours as needed for nausea.   DISCHARGE INSTRUCTIONS:  Essentially her discharge instructions are same  as before.  No restrictions in her diet.  She is to return to work 2  weeks postoperatively and that she has one  more week left.  No more  driving for about 3 days.  She is allowed to shower and resume previous  level of activity in 1 week.   WOUND CARE:  As before.   FOLLOWUP:  She needs to keep the previous appointment made to follow  with Dr. Donell Beers, [phone].  She also needs to follow up with Dr. Lissa Morales  at the previous appointment.  She has made to see her I think this is in  the next week.      Allison L. Rennis Harding, N.P.      Thomas A. Cornett, M.D.  Electronically Signed    ALE/MEDQ  D:  08/26/2008  T:  08/26/2008  Job:  [id number]   cc:   Almond Lint, MD  Dr. Lissa Morales

## 2010-08-17 NOTE — H&P (Signed)
NAMESHAMAINE, MULKERN              ACCOUNT NO.:  [id number]   MEDICAL RECORD NO.:  [id number]          PATIENT TYPE:  INP   LOCATION:  5121                         FACILITY:  [facility]   PHYSICIAN:  Ollen Gross. Vernell Morgans, M.D. DATE OF BIRTH:  Aug 12, 1965   DATE OF ADMISSION:  08/24/2008  DATE OF DISCHARGE:                              HISTORY & PHYSICAL   HISTORY OF PRESENT ILLNESS:  Dawn Frost is a 45 year old white female who  is now about 4 days out from a laparoscopic cholecystectomy by Dr.  Donell Beers.  She states that when she left the hospital she was having some  right-sided abdominal pain and over the last day or two the pain has  gotten worse.  She has had some nausea and vomiting x1.  She denies any  fevers at home.  She did have a normal bowel movement earlier.  She  states her pain is mostly on the right side.  She has been bloated since  her surgery but she feels as though the bloating is getting a little bit  better.   PAST MEDICAL HISTORY:  None.   PAST SURGICAL HISTORY:  Significant for the laparoscopic cholecystectomy  and lithotripsy.   MEDICATION:  Zoloft   ALLERGIES:  No known drug allergies.   SOCIAL HISTORY:  She denies use of tobacco or tobacco products.   FAMILY HISTORY:  Noncontributory.   PHYSICAL EXAMINATION:  VITAL SIGNS:  Temperature is 98.1, pulse 72,  blood pressure 140/95.  GENERAL:  She is a well-developed, well-nourished white female in no  acute distress.  SKIN:  Warm and dry, no jaundice.  EYES:  Her extraocular movements are intact.  Pupils are equal, round  and reactive to light.  Sclerae nonicteric.  LUNGS:  Clear bilaterally with no use of accessory respiratory muscles.  HEART:  Regular rate and rhythm with impulse in left chest.  ABDOMEN:  Soft.  She has some moderate right-sided abdominal pain but no  apparent guarding, rigidity or peritonitis.  EXTREMITIES:  No cyanosis, clubbing or edema.  She has good strength in  arms and legs.  PSYCHOLOGIC:  She is alert and oriented x3 with no evidence of anxiety  and depression.   On review of her lab work, it was significant for a white count of 5900  with no shift.  Liver functions were normal.   ASSESSMENT AND PLAN:  This is a 45 year old white female who is having  pain after her laparoscopic cholecystectomy.  At this point, I would be  most worried about either a bile leak or an intestinal injury although  with  the intestinal injury she seems to have normal white count, no fever, no  signs of peritonitis and no shift on her white count to suggest  intestinal injury.  We will plan to admit her to the hospital for  intravenous hydration and pain control.  We will do serial exams and get  a HIDA scan to rule out a bile leak and follow her closely.      Ollen Gross. Vernell Morgans, M.D.  Electronically Signed     PST/MEDQ  D:  08/24/2008  T:  08/25/2008  Job:  [id number]

## 2010-09-27 ENCOUNTER — Emergency Department (INDEPENDENT_AMBULATORY_CARE_PROVIDER_SITE_OTHER): Payer: 59

## 2010-09-27 ENCOUNTER — Emergency Department (HOSPITAL_BASED_OUTPATIENT_CLINIC_OR_DEPARTMENT_OTHER)
Admission: EM | Admit: 2010-09-27 | Discharge: 2010-09-27 | Disposition: A | Discharge status: Home / Self Care | Payer: 59 | Attending: Emergency Medicine | Admitting: Emergency Medicine

## 2010-09-27 DIAGNOSIS — R109 Unspecified abdominal pain: Secondary | ICD-10-CM

## 2010-09-27 DIAGNOSIS — N200 Calculus of kidney: Secondary | ICD-10-CM | POA: Insufficient documentation

## 2010-09-27 DIAGNOSIS — Q619 Cystic kidney disease, unspecified: Secondary | ICD-10-CM

## 2010-09-27 DIAGNOSIS — R1110 Vomiting, unspecified: Secondary | ICD-10-CM

## 2010-09-27 DIAGNOSIS — K861 Other chronic pancreatitis: Secondary | ICD-10-CM | POA: Insufficient documentation

## 2010-09-27 LAB — URINALYSIS, ROUTINE W REFLEX MICROSCOPIC
Glucose, UA: NEGATIVE mg/dL
Hgb urine dipstick: NEGATIVE
Leukocytes, UA: NEGATIVE
Protein, ur: NEGATIVE mg/dL
Specific Gravity, Urine: 1.029 (ref 1.005–1.030)
Urobilinogen, UA: 0.2 mg/dL (ref 0.0–1.0)

## 2010-09-27 LAB — PREGNANCY, URINE: Preg Test, Ur: NEGATIVE

## 2010-11-12 ENCOUNTER — Emergency Department (HOSPITAL_BASED_OUTPATIENT_CLINIC_OR_DEPARTMENT_OTHER)
Admission: EM | Admit: 2010-11-12 | Discharge: 2010-11-12 | Disposition: A | Discharge status: Home / Self Care | Payer: 59 | Attending: Emergency Medicine | Admitting: Emergency Medicine

## 2010-11-12 DIAGNOSIS — R109 Unspecified abdominal pain: Secondary | ICD-10-CM | POA: Insufficient documentation

## 2010-11-12 DIAGNOSIS — R197 Diarrhea, unspecified: Secondary | ICD-10-CM | POA: Insufficient documentation

## 2010-11-12 HISTORY — DX: Pre-excitation syndrome: I45.6

## 2010-11-12 LAB — BASIC METABOLIC PANEL
BUN: 19 mg/dL (ref 6–23)
Chloride: 105 mEq/L (ref 96–112)
GFR calc Af Amer: >60 mL/min (ref >60)
GFR calc non Af Amer: >60 mL/min (ref >60)
Potassium: 3.4 mEq/L — ABNORMAL LOW (ref 3.5–5.1)
Sodium: 140 mEq/L (ref 135–145)

## 2010-11-12 LAB — CBC
HCT: 34.9 % — ABNORMAL LOW (ref 36.0–46.0)
Hemoglobin: 11.9 g/dL — ABNORMAL LOW (ref 12.0–15.0)
MCHC: 34.1 g/dL (ref 30.0–36.0)
RDW: 13 % (ref 11.5–15.5)
WBC: 7.9 10*3/uL (ref 4.0–10.5)

## 2010-11-12 MED ORDER — POTASSIUM CHLORIDE CRYS ER 20 MEQ PO TBCR
40.0000 meq | EXTENDED_RELEASE_TABLET | Freq: Once | ORAL | Status: AC
  Administered 2010-11-12: 40 meq via ORAL
  Filled 2010-11-12: qty 2

## 2010-11-12 MED ORDER — FENTANYL CITRATE 0.05 MG/ML IJ SOLN
50.0000 ug | Freq: Once | INTRAMUSCULAR | Status: AC
  Administered 2010-11-12: 50 ug via INTRAVENOUS
  Filled 2010-11-12: qty 2

## 2010-11-12 MED ORDER — ONDANSETRON HCL 8 MG PO TABS: 4.0000 mg | ORAL_TABLET | Freq: Once | ORAL | Status: DC

## 2010-11-12 MED ORDER — ONDANSETRON 4 MG PO TBDP
4.0000 mg | ORAL_TABLET | Freq: Once | ORAL | Status: AC
  Administered 2010-11-12: 4 mg via ORAL
  Filled 2010-11-12: qty 1

## 2010-11-12 MED ORDER — HYDROCODONE-ACETAMINOPHEN 7.5-325 MG PO TABS: 1.0000 | ORAL_TABLET | ORAL | Status: AC | PRN

## 2010-11-12 MED ORDER — SODIUM CHLORIDE 0.9 % IV BOLUS (SEPSIS)
1000.0000 mL | Freq: Once | INTRAVENOUS | Status: AC
  Administered 2010-11-12: 1000 mL via INTRAVENOUS

## 2010-11-12 MED ORDER — VANCOMYCIN HCL 250 MG PO CAPS: 250.0000 mg | ORAL_CAPSULE | Freq: Four times a day (QID) | ORAL | Status: AC

## 2010-11-12 MED ORDER — VANCOMYCIN HCL IN DEXTROSE 1-5 GM/200ML-% IV SOLN
1000.0000 mg | Freq: Once | INTRAVENOUS | Status: AC
  Administered 2010-11-12: 1000 mg via INTRAVENOUS
  Filled 2010-11-12: qty 200

## 2010-11-12 NOTE — ED Provider Notes (Signed)
History     CSN: [id number] Arrival date & time: 11/12/2010  5:45 PM  Chief Complaint  Patient presents with  . Diarrhea  . Abdominal Pain   HPI Comments: Pt states she was treated at the [facility] about 2 weeks ago for C-diff. She was discharged on oral vancomycin. She noted recurrence of diarrhea this week and request to be evaluated. She reports 10 loose stools today.  Patient is a 45 y.o. female presenting with diarrhea and abdominal pain. The history is provided by the patient.  Diarrhea The primary symptoms include abdominal pain and diarrhea. Primary symptoms do not include dysuria or arthralgias. The illness began more than 7 days ago. The onset was gradual.  The illness is also significant for anorexia. The illness does not include chills or back pain. Significant associated medical issues include gallstones, PUD and diverticulitis. Associated medical issues do not include liver disease or irritable bowel syndrome.  Abdominal Pain The primary symptoms of the illness include abdominal pain and diarrhea. The primary symptoms of the illness do not include shortness of breath or dysuria.  Additional symptoms associated with the illness include anorexia. Symptoms associated with the illness do not include chills, hematuria, frequency or back pain. Significant associated medical issues include PUD, gallstones and diverticulitis. Significant associated medical issues do not include liver disease.    Past Medical History  Diagnosis Date  . Colitis   . WPW (Wolff-Parkinson-White syndrome)   . Kidney calculi     Past Surgical History  Procedure Date  . Cholecystectomy   . Tubal ligation   . Lithotripsy     No family history on file.  History  Substance Use Topics  . Smoking status: Current Everyday Smoker -- 1.0 packs/day  . Smokeless tobacco: Not on file  . Alcohol Use: No    OB History    Grav Para Term Preterm Abortions TAB SAB Ect Mult Living                   Review of Systems  Constitutional: Negative for chills and activity change.       All ROS Neg except as noted in HPI  HENT: Negative for nosebleeds and neck pain.   Eyes: Negative for photophobia and discharge.  Respiratory: Negative for cough, shortness of breath and wheezing.   Cardiovascular: Negative for chest pain and palpitations.  Gastrointestinal: Positive for abdominal pain, diarrhea and anorexia. Negative for blood in stool.  Genitourinary: Negative for dysuria, frequency and hematuria.  Musculoskeletal: Negative for back pain and arthralgias.  Skin: Negative.   Neurological: Negative for dizziness, seizures and speech difficulty.  Psychiatric/Behavioral: Negative for hallucinations and confusion.    Physical Exam  BP 116/91  Pulse 115  Temp(Src) 98.1 F (36.7 C) (Oral)  Resp 16  SpO2 100%  Physical Exam  Nursing note and vitals reviewed. Constitutional: She is oriented to person, place, and time. She appears well-developed and well-nourished.  Non-toxic appearance.  HENT:  Head: Normocephalic.  Right Ear: Tympanic membrane and external ear normal.  Left Ear: Tympanic membrane and external ear normal.  Eyes: EOM and lids are normal. Pupils are equal, round, and reactive to light.  Neck: Normal range of motion. Neck supple. Carotid bruit is not present.  Cardiovascular: Normal rate, regular rhythm, normal heart sounds, intact distal pulses and normal pulses.   Pulmonary/Chest: Breath sounds normal. No respiratory distress.  Abdominal: Soft. Bowel sounds are normal. There is tenderness. There is no guarding.  Rt greater than left tenderness of upper and lower abd.  Musculoskeletal: Normal range of motion.  Lymphadenopathy:       Head (right side): No submandibular adenopathy present.       Head (left side): No submandibular adenopathy present.    She has no cervical adenopathy.  Neurological: She is alert and oriented to person, place, and time. She has  normal strength. No cranial nerve deficit or sensory deficit.  Skin: Skin is warm and dry.  Psychiatric: She has a normal mood and affect. Her speech is normal.    ED Course  Procedures  MDM I have reviewed nursing notes, vital signs, and all appropriate lab and imaging results for this patient.      Kathie Dike, PA 11/12/10 1832  Kathie Dike, [place] 11/12/10 2156

## 2010-11-12 NOTE — Progress Notes (Signed)
Test results discussed. No diarrhea since being in ED. Diffuse abd pain, right greater than left improved with pain meds, but not resolved. Discussed plan to restart vancomycin and pain meds. Pt to see her MD as soon as possible. She is to return to ED if any changes or problem.

## 2010-11-12 NOTE — ED Notes (Signed)
Pt reports she has CDiff and has been admitted for the same.  She continues to have abdominal pain and diarrhea.  Pt reports 10 loose stools today.

## 2010-11-19 ENCOUNTER — Encounter (HOSPITAL_BASED_OUTPATIENT_CLINIC_OR_DEPARTMENT_OTHER): Payer: Self-pay | Admitting: *Deleted

## 2010-11-19 ENCOUNTER — Emergency Department (HOSPITAL_BASED_OUTPATIENT_CLINIC_OR_DEPARTMENT_OTHER)
Admission: EM | Admit: 2010-11-19 | Discharge: 2010-11-19 | Disposition: A | Discharge status: Home / Self Care | Payer: 59 | Attending: Emergency Medicine | Admitting: Emergency Medicine

## 2010-11-19 DIAGNOSIS — E876 Hypokalemia: Secondary | ICD-10-CM | POA: Insufficient documentation

## 2010-11-19 DIAGNOSIS — F17200 Nicotine dependence, unspecified, uncomplicated: Secondary | ICD-10-CM | POA: Insufficient documentation

## 2010-11-19 DIAGNOSIS — R197 Diarrhea, unspecified: Secondary | ICD-10-CM

## 2010-11-19 HISTORY — DX: Calculus of kidney: N20.0

## 2010-11-19 LAB — DIFFERENTIAL
Basophils Absolute: 0 10*3/uL (ref 0.0–0.1)
Eosinophils Relative: 2 % (ref 0–5)
Lymphocytes Relative: 25 % (ref 12–46)
Lymphs Abs: 2.4 10*3/uL (ref 0.7–4.0)
Neutro Abs: 6.2 10*3/uL (ref 1.7–7.7)
Neutrophils Relative %: 65 % (ref 43–77)

## 2010-11-19 LAB — COMPREHENSIVE METABOLIC PANEL
ALT: 22 U/L (ref 0–35)
AST: 21 U/L (ref 0–37)
Alkaline Phosphatase: 108 U/L (ref 39–117)
CO2: 23 mEq/L (ref 19–32)
Calcium: 9 mg/dL (ref 8.4–10.5)
Chloride: 104 mEq/L (ref 96–112)
GFR calc non Af Amer: >60 mL/min (ref >60)
Potassium: 3.2 mEq/L — ABNORMAL LOW (ref 3.5–5.1)
Sodium: 138 mEq/L (ref 135–145)

## 2010-11-19 LAB — URINALYSIS, ROUTINE W REFLEX MICROSCOPIC
Leukocytes, UA: NEGATIVE
Nitrite: NEGATIVE
Specific Gravity, Urine: 1.014 (ref 1.005–1.030)
Urobilinogen, UA: 0.2 mg/dL (ref 0.0–1.0)
pH: 7 (ref 5.0–8.0)

## 2010-11-19 LAB — CBC
Platelets: 219 10*3/uL (ref 150–400)
RBC: 3.54 MIL/uL — ABNORMAL LOW (ref 3.87–5.11)
RDW: 12.8 % (ref 11.5–15.5)
WBC: 9.4 10*3/uL (ref 4.0–10.5)

## 2010-11-19 LAB — PREGNANCY, URINE: Preg Test, Ur: NEGATIVE

## 2010-11-19 MED ORDER — SODIUM CHLORIDE 0.9 % IV BOLUS (SEPSIS)
1000.0000 mL | Freq: Once | INTRAVENOUS | Status: AC
  Administered 2010-11-19: 1000 mL via INTRAVENOUS

## 2010-11-19 MED ORDER — MORPHINE SULFATE 4 MG/ML IJ SOLN
4.0000 mg | Freq: Once | INTRAMUSCULAR | Status: AC
  Administered 2010-11-19: 4 mg via INTRAVENOUS
  Filled 2010-11-19: qty 1

## 2010-11-19 MED ORDER — ONDANSETRON HCL 4 MG/2ML IJ SOLN
4.0000 mg | Freq: Once | INTRAMUSCULAR | Status: AC
  Administered 2010-11-19: 4 mg via INTRAVENOUS
  Filled 2010-11-19: qty 2

## 2010-11-19 NOTE — ED Provider Notes (Signed)
History     CSN: [id number] Arrival date & time: 11/19/2010  5:44 PM  Chief Complaint  Patient presents with  . Diarrhea   HPI Comments: Pt state that she has had problem for the last 3 months with her gi problems stemming all the way back to 2009:pt states that her gi doctor says they don't know what else to do with her and that she may have go to [facility]:pt was seen by her gi yesterday:pt states that she was admitted to [facility] for c-diff in the last couple of weeks:pt was seen here one week ago and had vancomycin iv and fluids:pt states that over the last 5 days she has day increase volume of diarrhea:pt states that she is going multiple times a day  Patient is a 45 y.o. female presenting with diarrhea. The history is provided by the patient. No language interpreter was used.  Diarrhea The primary symptoms include abdominal pain, nausea, vomiting and diarrhea. Primary symptoms do not include fever, fatigue, hematemesis, hematochezia, dysuria or arthralgias. The problem has been gradually worsening.  The illness is also significant for back pain. The illness does not include chills, anorexia, bloating or constipation. Associated medical issues do not include inflammatory bowel disease. Associated medical issues comments: chrons, muliple inflammatory process.    Past Medical History  Diagnosis Date  . Colitis   . WPW (Wolff-Parkinson-White syndrome)   . Kidney calculi   . Kidney stone     Past Surgical History  Procedure Date  . Cholecystectomy   . Tubal ligation   . Lithotripsy     History reviewed. No pertinent family history.  History  Substance Use Topics  . Smoking status: Current Everyday Smoker -- 1.0 packs/day for 30 years    Types: Cigarettes  . Smokeless tobacco: Not on file  . Alcohol Use: No    OB History    Grav Para Term Preterm Abortions TAB SAB Ect Mult Living   3 3              Review of Systems  Constitutional: Negative for fever, chills and fatigue.    Gastrointestinal: Positive for nausea, vomiting, abdominal pain and diarrhea. Negative for constipation, hematochezia, bloating, anorexia and hematemesis.  Genitourinary: Negative for dysuria.  Musculoskeletal: Positive for back pain. Negative for arthralgias.  All other systems reviewed and are negative.    Physical Exam  BP 155/91  Pulse 75  Temp(Src) 98 F (36.7 C) (Oral)  Resp 20  Ht 5\' 2"  (1.575 m)  Wt 125 lb (56.7 kg)  BMI 22.86 kg/m2  SpO2 100%  LMP 11/03/2010  Physical Exam  Nursing note and vitals reviewed. Constitutional: She is oriented to person, place, and time. She appears well-developed and well-nourished.  HENT:  Head: Normocephalic.  Eyes: Pupils are equal, round, and reactive to light.  Cardiovascular: Normal rate and regular rhythm.   Pulmonary/Chest: Effort normal and breath sounds normal.  Abdominal: Soft. Bowel sounds are normal. There is no rebound and no guarding.  Musculoskeletal: Normal range of motion.  Neurological: She is alert and oriented to person, place, and time.  Skin: Skin is warm and dry.  Psychiatric: She has a normal mood and affect.    ED Course  Procedures Results for orders placed during the hospital encounter of 11/19/10  URINALYSIS, ROUTINE W REFLEX MICROSCOPIC      Component Value Range   Color, Urine YELLOW  YELLOW    Appearance CLOUDY (*) CLEAR    Specific Gravity, Urine 1.014  1.005 - 1.030    pH 7.0  5.0 - 8.0    Glucose, UA NEGATIVE  NEGATIVE (mg/dL)   Hgb urine dipstick NEGATIVE  NEGATIVE    Bilirubin Urine NEGATIVE  NEGATIVE    Ketones, ur NEGATIVE  NEGATIVE (mg/dL)   Protein, ur NEGATIVE  NEGATIVE (mg/dL)   Urobilinogen, UA 0.2  0.0 - 1.0 (mg/dL)   Nitrite NEGATIVE  NEGATIVE    Leukocytes, UA NEGATIVE  NEGATIVE   PREGNANCY, URINE      Component Value Range   Preg Test, Ur NEGATIVE    CBC      Component Value Range   WBC 9.4  4.0 - 10.5 (K/uL)   RBC 3.54 (*) 3.87 - 5.11 (MIL/uL)   Hemoglobin 12.5  12.0 -  15.0 (g/dL)   HCT 16.1  09.6 - 04.5 (%)   MCV 102.5 (*) 78.0 - 100.0 (fL)   MCH 35.3 (*) 26.0 - 34.0 (pg)   MCHC 34.4  30.0 - 36.0 (g/dL)   RDW 40.9  81.1 - 91.4 (%)   Platelets 219  150 - 400 (K/uL)  DIFFERENTIAL      Component Value Range   Neutrophils Relative 65  43 - 77 (%)   Neutro Abs 6.2  1.7 - 7.7 (K/uL)   Lymphocytes Relative 25  12 - 46 (%)   Lymphs Abs 2.4  0.7 - 4.0 (K/uL)   Monocytes Relative 7  3 - 12 (%)   Monocytes Absolute 0.7  0.1 - 1.0 (K/uL)   Eosinophils Relative 2  0 - 5 (%)   Eosinophils Absolute 0.2  0.0 - 0.7 (K/uL)   Basophils Relative 0  0 - 1 (%)   Basophils Absolute 0.0  0.0 - 0.1 (K/uL)  COMPREHENSIVE METABOLIC PANEL      Component Value Range   Sodium 138  135 - 145 (mEq/L)   Potassium 3.2 (*) 3.5 - 5.1 (mEq/L)   Chloride 104  96 - 112 (mEq/L)   CO2 23  19 - 32 (mEq/L)   Glucose, Bld 97  70 - 99 (mg/dL)   BUN 9  6 - 23 (mg/dL)   Creatinine, Ser 7.82  0.50 - 1.10 (mg/dL)   Calcium 9.0  8.4 - 95.6 (mg/dL)   Total Protein 7.2  6.0 - 8.3 (g/dL)   Albumin 4.2  3.5 - 5.2 (g/dL)   AST 21  0 - 37 (U/L)   ALT 22  0 - 35 (U/L)   Alkaline Phosphatase 108  39 - 117 (U/L)   Total Bilirubin 0.2 (*) 0.3 - 1.2 (mg/dL)   GFR calc non Af Amer >60  >60 (mL/min)   GFR calc Af Amer >60  >60 (mL/min)  LIPASE, BLOOD      Component Value Range   Lipase 20  11 - 59 (U/L)   No results found.  MDM Pt is feeling more comfortable at this time:pt requesting an new gi referral:will give her that:no need for imaging at this time   Medical screening examination/treatment/procedure(s) were performed by non-physician practitioner and as supervising physician I was immediately available for consultation/collaboration.    Teressa Lower, NP 11/19/10 1935  Shelda Jakes, MD 11/19/10 2002

## 2010-11-19 NOTE — ED Notes (Signed)
Patient states she has had diarrhea for the last 3 months.  States she has seen a GI doctor at [place].  Multiple GI problems.  For the last 5 days she has had "volumlus " diarrhea and feels bad.   States she is here today because she attempted to see [facility] and was told that she can not be seen by them but she needs a referral.  Today she has been having vomiting and diarrhea today.

## 2010-11-21 NOTE — ED Provider Notes (Signed)
Medical screening examination/treatment/procedure(s) were performed by non-physician practitioner and as supervising physician I was immediately available for consultation/collaboration.   Cyndra Numbers, MD 11/21/10 [phone]

## 2010-11-21 NOTE — Progress Notes (Signed)
  Medical screening examination/treatment/procedure(s) were performed by non-physician practitioner and as supervising physician I was immediately available for consultation/collaboration.     

## 2010-12-16 ENCOUNTER — Emergency Department (HOSPITAL_BASED_OUTPATIENT_CLINIC_OR_DEPARTMENT_OTHER)
Admission: EM | Admit: 2010-12-16 | Discharge: 2010-12-16 | Disposition: A | Discharge status: Home / Self Care | Payer: 59 | Attending: Emergency Medicine | Admitting: Emergency Medicine

## 2010-12-16 ENCOUNTER — Encounter (HOSPITAL_BASED_OUTPATIENT_CLINIC_OR_DEPARTMENT_OTHER): Payer: Self-pay | Admitting: *Deleted

## 2010-12-16 DIAGNOSIS — R112 Nausea with vomiting, unspecified: Secondary | ICD-10-CM | POA: Insufficient documentation

## 2010-12-16 DIAGNOSIS — R1084 Generalized abdominal pain: Secondary | ICD-10-CM

## 2010-12-16 DIAGNOSIS — R109 Unspecified abdominal pain: Secondary | ICD-10-CM | POA: Insufficient documentation

## 2010-12-16 DIAGNOSIS — F17200 Nicotine dependence, unspecified, uncomplicated: Secondary | ICD-10-CM | POA: Insufficient documentation

## 2010-12-16 HISTORY — DX: Primary adrenocortical insufficiency: E27.1

## 2010-12-16 LAB — SEDIMENTATION RATE: Sed Rate: 11 mm/hr (ref 0–22)

## 2010-12-16 LAB — URINALYSIS, ROUTINE W REFLEX MICROSCOPIC
Bilirubin Urine: NEGATIVE
Hgb urine dipstick: NEGATIVE
Nitrite: NEGATIVE
Specific Gravity, Urine: 1.018 (ref 1.005–1.030)
Urobilinogen, UA: 0.2 mg/dL (ref 0.0–1.0)
pH: 6 (ref 5.0–8.0)

## 2010-12-16 LAB — COMPREHENSIVE METABOLIC PANEL
ALT: 16 U/L (ref 0–35)
Albumin: 3.7 g/dL (ref 3.5–5.2)
Alkaline Phosphatase: 110 U/L (ref 39–117)
Calcium: 8.8 mg/dL (ref 8.4–10.5)
GFR calc Af Amer: >60 mL/min (ref >60)
Potassium: 3.4 mEq/L — ABNORMAL LOW (ref 3.5–5.1)
Sodium: 140 mEq/L (ref 135–145)
Total Protein: 6.6 g/dL (ref 6.0–8.3)

## 2010-12-16 LAB — DIFFERENTIAL
Basophils Relative: 0 % (ref 0–1)
Eosinophils Absolute: 0.2 10*3/uL (ref 0.0–0.7)
Eosinophils Relative: 2 % (ref 0–5)
Neutrophils Relative %: 73 % (ref 43–77)

## 2010-12-16 LAB — LIPASE, BLOOD: Lipase: 59 U/L (ref 11–59)

## 2010-12-16 LAB — CBC
MCH: 34.9 pg — ABNORMAL HIGH (ref 26.0–34.0)
MCHC: 34.1 g/dL (ref 30.0–36.0)
MCV: 102.4 fL — ABNORMAL HIGH (ref 78.0–100.0)
Platelets: 271 10*3/uL (ref 150–400)
RBC: 3.35 MIL/uL — ABNORMAL LOW (ref 3.87–5.11)
RDW: 13.6 % (ref 11.5–15.5)

## 2010-12-16 LAB — LACTIC ACID, PLASMA: Lactic Acid, Venous: 0.6 mmol/L (ref 0.5–2.2)

## 2010-12-16 LAB — PREGNANCY, URINE: Preg Test, Ur: NEGATIVE

## 2010-12-16 MED ORDER — MORPHINE SULFATE 4 MG/ML IJ SOLN
4.0000 mg | Freq: Once | INTRAMUSCULAR | Status: AC
  Administered 2010-12-16: 4 mg via INTRAVENOUS
  Filled 2010-12-16: qty 1

## 2010-12-16 MED ORDER — ONDANSETRON HCL 4 MG/2ML IJ SOLN
INTRAMUSCULAR | Status: AC
  Filled 2010-12-16: qty 2

## 2010-12-16 MED ORDER — PROMETHAZINE HCL 25 MG/ML IJ SOLN
25.0000 mg | Freq: Once | INTRAMUSCULAR | Status: AC
  Administered 2010-12-16: 25 mg via INTRAVENOUS
  Filled 2010-12-16: qty 1

## 2010-12-16 MED ORDER — PROMETHAZINE HCL 25 MG PO TABS: 25.0000 mg | ORAL_TABLET | Freq: Four times a day (QID) | ORAL | Status: DC | PRN

## 2010-12-16 MED ORDER — OXYCODONE-ACETAMINOPHEN 5-325 MG PO TABS: 1.0000 | ORAL_TABLET | ORAL | Status: AC | PRN

## 2010-12-16 MED ORDER — SODIUM CHLORIDE 0.9 % IV BOLUS (SEPSIS)
1000.0000 mL | Freq: Once | INTRAVENOUS | Status: AC
  Administered 2010-12-16: 1000 mL via INTRAVENOUS

## 2010-12-16 MED ORDER — ONDANSETRON HCL 4 MG/2ML IJ SOLN
4.0000 mg | Freq: Once | INTRAMUSCULAR | Status: AC
  Administered 2010-12-16: 4 mg via INTRAVENOUS
  Filled 2010-12-16: qty 2

## 2010-12-16 MED ORDER — FAMOTIDINE IN NACL 20-0.9 MG/50ML-% IV SOLN
20.0000 mg | Freq: Once | INTRAVENOUS | Status: AC
  Administered 2010-12-16: 20 mg via INTRAVENOUS
  Filled 2010-12-16: qty 50

## 2010-12-16 MED ORDER — HYDROMORPHONE HCL 1 MG/ML IJ SOLN
1.0000 mg | Freq: Once | INTRAMUSCULAR | Status: AC
  Administered 2010-12-16: 1 mg via INTRAVENOUS
  Filled 2010-12-16: qty 1

## 2010-12-16 MED ORDER — DIPHENHYDRAMINE HCL 50 MG/ML IJ SOLN
25.0000 mg | Freq: Once | INTRAMUSCULAR | Status: AC
  Administered 2010-12-16: 25 mg via INTRAVENOUS
  Filled 2010-12-16: qty 1

## 2010-12-16 NOTE — ED Notes (Signed)
Pt. Reports she has had vomiting and diarrhea x 2 days.  Pt. Is not actively vomiting at this time.  No diarrhea noted and Pt. Was given bedpans and call bell with pads placed under her.

## 2010-12-16 NOTE — ED Notes (Signed)
Pt. Reports she is having mid sternal pain with back pain.. No distress noted in Pt.  VSS with husband at bedside.

## 2010-12-16 NOTE — ED Notes (Signed)
Pt. Has had 1 bout of diarrhea in room and no vomiting. Pt. Reports she needs lots of pain medicine due to she has a high pain tolerance.  Pt. Husband at bedside.  No distress noted in Pt.

## 2010-12-16 NOTE — ED Provider Notes (Signed)
History     CSN: [id number] Arrival date & time: 12/16/2010  3:12 PM   Chief Complaint  Patient presents with  . Nausea  . Emesis  . Abdominal Pain     (Include location/radiation/quality/duration/timing/severity/associated sxs/prior treatment) HPI Comments: Patient presents with diffuse abdominal pain, nausea, vomiting, diarrhea since midnight today. She states that this is different than her usual Crohn's colitis flares. That pain is normally just localized to the right side. She denies any fevers. She denies any dysuria or hematuria and she has had kidney stones in the past. She was told by her physician to come into the hospital for similar symptoms because she was recently diagnosed with Addison's disease. She has not been able to take any medicine to help her symptoms.  Patient is a 45 y.o. female presenting with vomiting and abdominal pain.  Emesis  Associated symptoms include abdominal pain, chills and diarrhea. Pertinent negatives include no cough, no fever and no headaches.  Abdominal Pain The primary symptoms of the illness include abdominal pain, nausea, vomiting and diarrhea. The primary symptoms of the illness do not include fever, shortness of breath, dysuria or vaginal discharge.  Additional symptoms associated with the illness include chills. Symptoms associated with the illness do not include back pain.     Past Medical History  Diagnosis Date  . Colitis   . WPW (Wolff-Parkinson-White syndrome)   . Kidney calculi   . Kidney stone   . Addison disease      Past Surgical History  Procedure Date  . Cholecystectomy   . Tubal ligation   . Lithotripsy     History reviewed. No pertinent family history.  History  Substance Use Topics  . Smoking status: Current Everyday Smoker -- 1.0 packs/day for 30 years    Types: Cigarettes  . Smokeless tobacco: Not on file  . Alcohol Use: No    OB History    Grav Para Term Preterm Abortions TAB SAB Ect Mult Living   3  3              Review of Systems  Constitutional: Positive for chills. Negative for fever.  HENT: Negative.   Eyes: Negative.  Negative for discharge and redness.  Respiratory: Negative.  Negative for cough and shortness of breath.   Cardiovascular: Negative.  Negative for chest pain.  Gastrointestinal: Positive for nausea, vomiting, abdominal pain and diarrhea.  Genitourinary: Negative.  Negative for dysuria and vaginal discharge.  Musculoskeletal: Negative.  Negative for back pain.  Skin: Negative.  Negative for color change and rash.  Neurological: Negative.  Negative for syncope and headaches.  Hematological: Negative.  Negative for adenopathy.  Psychiatric/Behavioral: Negative.  Negative for confusion.  All other systems reviewed and are negative.    Allergies  Review of patient's allergies indicates no known allergies.  Home Medications   Current Outpatient Rx  Name Route Sig Dispense Refill  . CLONAZEPAM 1 MG PO TABS Oral Take 1 mg by mouth 3 (three) times daily as needed. anxiety    . HYDROCORTISONE 20 MG PO TABS Oral Take 20 mg by mouth every morning.      Marland Kitchen HYDROCORTISONE 20 MG PO TABS Oral Take 10 mg by mouth at bedtime.      Marland Kitchen MESALAMINE 400 MG PO TBEC Oral Take 800 mg by mouth 2 (two) times daily.      Marland Kitchen ONE-DAILY MULTI VITAMINS PO TABS Oral Take 1 tablet by mouth daily.      . SERTRALINE HCL 100  MG PO TABS Oral Take 100 mg by mouth daily.      . ACETAMINOPHEN 650 MG PO TBCR Oral Take 1,300 mg by mouth every 8 (eight) hours as needed. Pain      . BUDESONIDE 3 MG PO CP24 Oral Take 9 mg by mouth every morning.      Marland Kitchen DICYCLOMINE HCL 10 MG PO CAPS Oral Take 10 mg by mouth as needed. Muscle spasms      Physical Exam    BP 149/96  Pulse 91  Temp(Src) 98.3 F (36.8 C) (Oral)  Resp 20  SpO2 100%  LMP 11/03/2010  Physical Exam  Constitutional: She is oriented to person, place, and time. She appears well-developed and well-nourished.  Non-toxic appearance. She  does not have a sickly appearance.       Patient is noted to be visibly shaking when I enter the room but does not appear diaphoretic.  HENT:  Head: Normocephalic and atraumatic.  Eyes: Conjunctivae, EOM and lids are normal. Pupils are equal, round, and reactive to light. No scleral icterus.  Neck: Trachea normal and normal range of motion. Neck supple.  Cardiovascular: Normal rate, regular rhythm and normal heart sounds.   Pulmonary/Chest: Effort normal and breath sounds normal. No respiratory distress. She has no wheezes. She has no rales. She exhibits no tenderness.  Abdominal: Soft. Normal appearance. There is tenderness. There is no rebound, no guarding and no CVA tenderness.       There is mild diffuse abdominal tenderness but no rebound or guarding  Musculoskeletal: Normal range of motion.  Neurological: She is alert and oriented to person, place, and time. She has normal strength.  Skin: Skin is warm, dry and intact. No rash noted. She is not diaphoretic.  Psychiatric: She has a normal mood and affect. Her behavior is normal. Judgment and thought content normal.    ED Course  Procedures  Results for orders placed during the hospital encounter of 12/16/10  CBC      Component Value Range   WBC 10.1  4.0 - 10.5 (K/uL)   RBC 3.35 (*) 3.87 - 5.11 (MIL/uL)   Hemoglobin 11.7 (*) 12.0 - 15.0 (g/dL)   HCT 24.4 (*) 01.0 - 46.0 (%)   MCV 102.4 (*) 78.0 - 100.0 (fL)   MCH 34.9 (*) 26.0 - 34.0 (pg)   MCHC 34.1  30.0 - 36.0 (g/dL)   RDW 27.2  53.6 - 64.4 (%)   Platelets 271  150 - 400 (K/uL)  DIFFERENTIAL      Component Value Range   Neutrophils Relative 73  43 - 77 (%)   Neutro Abs 7.3  1.7 - 7.7 (K/uL)   Lymphocytes Relative 17  12 - 46 (%)   Lymphs Abs 1.7  0.7 - 4.0 (K/uL)   Monocytes Relative 8  3 - 12 (%)   Monocytes Absolute 0.8  0.1 - 1.0 (K/uL)   Eosinophils Relative 2  0 - 5 (%)   Eosinophils Absolute 0.2  0.0 - 0.7 (K/uL)   Basophils Relative 0  0 - 1 (%)   Basophils  Absolute 0.0  0.0 - 0.1 (K/uL)  COMPREHENSIVE METABOLIC PANEL      Component Value Range   Sodium 140  135 - 145 (mEq/L)   Potassium 3.4 (*) 3.5 - 5.1 (mEq/L)   Chloride 109  96 - 112 (mEq/L)   CO2 19  19 - 32 (mEq/L)   Glucose, Bld 94  70 - 99 (mg/dL)   BUN  11  6 - 23 (mg/dL)   Creatinine, Ser 0.45  0.50 - 1.10 (mg/dL)   Calcium 8.8  8.4 - 40.9 (mg/dL)   Total Protein 6.6  6.0 - 8.3 (g/dL)   Albumin 3.7  3.5 - 5.2 (g/dL)   AST 17  0 - 37 (U/L)   ALT 16  0 - 35 (U/L)   Alkaline Phosphatase 110  39 - 117 (U/L)   Total Bilirubin 0.2 (*) 0.3 - 1.2 (mg/dL)   GFR calc non Af Amer >60  >60 (mL/min)   GFR calc Af Amer >60  >60 (mL/min)  LIPASE, BLOOD      Component Value Range   Lipase 59  11 - 59 (U/L)  URINALYSIS, ROUTINE W REFLEX MICROSCOPIC      Component Value Range   Color, Urine YELLOW  YELLOW    Appearance CLEAR  CLEAR    Specific Gravity, Urine 1.018  1.005 - 1.030    pH 6.0  5.0 - 8.0    Glucose, UA NEGATIVE  NEGATIVE (mg/dL)   Hgb urine dipstick NEGATIVE  NEGATIVE    Bilirubin Urine NEGATIVE  NEGATIVE    Ketones, ur NEGATIVE  NEGATIVE (mg/dL)   Protein, ur NEGATIVE  NEGATIVE (mg/dL)   Urobilinogen, UA 0.2  0.0 - 1.0 (mg/dL)   Nitrite NEGATIVE  NEGATIVE    Leukocytes, UA NEGATIVE  NEGATIVE   PREGNANCY, URINE      Component Value Range   Preg Test, Ur NEGATIVE    LACTIC ACID, PLASMA      Component Value Range   Lactic Acid, Venous 0.6  0.5 - 2.2 (mmol/L)  SEDIMENTATION RATE      Component Value Range   Sed Rate 11  0 - 22 (mm/hr)  C-REACTIVE PROTEIN      Component Value Range   CRP 0.41 (*) <0.60 (mg/dL)   No results found.    MDM Patient here with abdominal pain, nausea, vomiting, diarrhea with soft abdomen exam. She is not appear to have an acute surgical process at this time. She has no elevated WBC count nor is she had an elevated ESR CRP to suggest that she has an acute Crohn's colitis flare. She has a normal lactate value and so is not likely to have  ischemic bowel. She has no signs of blood in her urine to indicate that she has a kidney stone. She does not have a UTI and her UA. She has normal LFTs and lipase so she does not appear to have signs of hepatitis, pancreatitis, cholecystitis. She also had no focal right upper quadrant pain. Patient gives a history for possible Addison's but has a normal sodium and normal potassium at this point in time. Patient's symptoms have completely resolved with her doses of pain meds and antiemetics. Her abdomen on reexamination is soft and benign. Given her normal vital signs, normal laboratory studies and her reassessment I feel she is safe for discharge home with precautions.       Nat Christen, MD 12/16/10 [phone]

## 2010-12-16 NOTE — ED Notes (Signed)
Nausea vomiting and diarrhea onset yesterday states recently diagnosed with addisons disease has crohns colitis states this feels different than usual crohns. Pain generalized in abdomen and into back

## 2010-12-26 ENCOUNTER — Emergency Department (INDEPENDENT_AMBULATORY_CARE_PROVIDER_SITE_OTHER): Payer: 59

## 2010-12-26 ENCOUNTER — Encounter (HOSPITAL_BASED_OUTPATIENT_CLINIC_OR_DEPARTMENT_OTHER): Payer: Self-pay | Admitting: Emergency Medicine

## 2010-12-26 ENCOUNTER — Emergency Department (HOSPITAL_BASED_OUTPATIENT_CLINIC_OR_DEPARTMENT_OTHER)
Admission: EM | Admit: 2010-12-26 | Discharge: 2010-12-26 | Disposition: A | Discharge status: Home / Self Care | Payer: 59 | Attending: Emergency Medicine | Admitting: Emergency Medicine

## 2010-12-26 ENCOUNTER — Other Ambulatory Visit: Payer: Self-pay

## 2010-12-26 DIAGNOSIS — R109 Unspecified abdominal pain: Secondary | ICD-10-CM | POA: Insufficient documentation

## 2010-12-26 DIAGNOSIS — R197 Diarrhea, unspecified: Secondary | ICD-10-CM | POA: Insufficient documentation

## 2010-12-26 DIAGNOSIS — R1110 Vomiting, unspecified: Secondary | ICD-10-CM | POA: Insufficient documentation

## 2010-12-26 DIAGNOSIS — R079 Chest pain, unspecified: Secondary | ICD-10-CM

## 2010-12-26 DIAGNOSIS — Z79899 Other long term (current) drug therapy: Secondary | ICD-10-CM | POA: Insufficient documentation

## 2010-12-26 DIAGNOSIS — R791 Abnormal coagulation profile: Secondary | ICD-10-CM

## 2010-12-26 LAB — DIFFERENTIAL
Basophils Absolute: 0 10*3/uL (ref 0.0–0.1)
Basophils Relative: 0 % (ref 0–1)
Eosinophils Absolute: 0.2 10*3/uL (ref 0.0–0.7)
Eosinophils Relative: 2 % (ref 0–5)
Neutrophils Relative %: 90 % — ABNORMAL HIGH (ref 43–77)

## 2010-12-26 LAB — URINALYSIS, ROUTINE W REFLEX MICROSCOPIC
Glucose, UA: NEGATIVE mg/dL
Nitrite: NEGATIVE
Specific Gravity, Urine: 1.03 (ref 1.005–1.030)
pH: 6 (ref 5.0–8.0)

## 2010-12-26 LAB — COMPREHENSIVE METABOLIC PANEL
ALT: 19 U/L (ref 0–35)
AST: 23 U/L (ref 0–37)
Albumin: 4.2 g/dL (ref 3.5–5.2)
Alkaline Phosphatase: 101 U/L (ref 39–117)
Calcium: 9.6 mg/dL (ref 8.4–10.5)
GFR calc Af Amer: >60 mL/min (ref >60)
Potassium: 3.7 mEq/L (ref 3.5–5.1)
Sodium: 141 mEq/L (ref 135–145)
Total Protein: 7.5 g/dL (ref 6.0–8.3)

## 2010-12-26 LAB — CBC
MCH: 35.7 pg — ABNORMAL HIGH (ref 26.0–34.0)
MCV: 103.9 fL — ABNORMAL HIGH (ref 78.0–100.0)
Platelets: 230 10*3/uL (ref 150–400)
RBC: 3.87 MIL/uL (ref 3.87–5.11)
RDW: 14.7 % (ref 11.5–15.5)

## 2010-12-26 LAB — URINE MICROSCOPIC-ADD ON

## 2010-12-26 LAB — D-DIMER, QUANTITATIVE: D-Dimer, Quant: 0.58 ug/mL-FEU — ABNORMAL HIGH (ref 0.00–0.48)

## 2010-12-26 MED ORDER — SODIUM CHLORIDE 0.9 % IV BOLUS (SEPSIS)
1000.0000 mL | Freq: Once | INTRAVENOUS | Status: AC
  Administered 2010-12-26: 1000 mL via INTRAVENOUS

## 2010-12-26 MED ORDER — OXYCODONE-ACETAMINOPHEN 5-325 MG PO TABS: 2.0000 | ORAL_TABLET | ORAL | Status: AC | PRN

## 2010-12-26 MED ORDER — IOHEXOL 350 MG/ML SOLN: 100.0000 mL | Freq: Once | INTRAVENOUS | Status: DC | PRN

## 2010-12-26 MED ORDER — ONDANSETRON HCL 4 MG/2ML IJ SOLN
4.0000 mg | Freq: Once | INTRAMUSCULAR | Status: AC
  Administered 2010-12-26: 4 mg via INTRAVENOUS
  Filled 2010-12-26: qty 2

## 2010-12-26 MED ORDER — MORPHINE SULFATE 4 MG/ML IJ SOLN
4.0000 mg | Freq: Once | INTRAMUSCULAR | Status: AC
  Administered 2010-12-26: 4 mg via INTRAVENOUS
  Filled 2010-12-26: qty 1

## 2010-12-26 MED ORDER — PROMETHAZINE HCL 25 MG/ML IJ SOLN
12.5000 mg | Freq: Once | INTRAMUSCULAR | Status: AC
  Administered 2010-12-26: 12.5 mg via INTRAVENOUS
  Filled 2010-12-26: qty 1

## 2010-12-26 NOTE — ED Notes (Signed)
Pt c/o NVD, chronic issue- referred here by GI to receive IVFs/meds

## 2010-12-26 NOTE — ED Notes (Signed)
Pt sts the original abd pain has decreased, but now she feels like "a rubber band" is around ribs, just under breasts

## 2010-12-26 NOTE — ED Provider Notes (Signed)
History     CSN: [id number] Arrival date & time: 12/26/2010  2:27 PM  Chief Complaint  Patient presents with  . Emesis  . Diarrhea    HPI  (Consider location/radiation/quality/duration/timing/severity/associated sxs/prior treatment)  HPI Comments: Pt states that she has chronic history of similar symptoms:pt states that when she had muscle cramping her doctor told her to come and be checked for electrolyte abnormalities  Patient is a 45 y.o. female presenting with vomiting. The history is provided by the patient. No language interpreter was used.  Emesis  This is a recurrent problem. The current episode started 2 days ago. The problem occurs 5 to 10 times per day. The problem has not changed since onset.The emesis has an appearance of stomach contents and bilious material. There has been no fever. Associated symptoms include abdominal pain, diarrhea and myalgias. Pertinent negatives include no cough and no fever.    Past Medical History  Diagnosis Date  . Colitis   . WPW (Wolff-Parkinson-White syndrome)   . Kidney calculi   . Kidney stone   . Addison disease     Past Surgical History  Procedure Date  . Cholecystectomy   . Tubal ligation   . Lithotripsy     No family history on file.  History  Substance Use Topics  . Smoking status: Current Everyday Smoker -- 1.0 packs/day for 30 years    Types: Cigarettes  . Smokeless tobacco: Not on file  . Alcohol Use: No    OB History    Grav Para Term Preterm Abortions TAB SAB Ect Mult Living   3 3              Review of Systems  Review of Systems  Constitutional: Negative for fever.  Respiratory: Negative for cough.   Gastrointestinal: Positive for vomiting, abdominal pain and diarrhea.  Musculoskeletal: Positive for myalgias.  All other systems reviewed and are negative.    Allergies  Review of patient's allergies indicates no known allergies.  Home Medications   Current Outpatient Rx  Name Route Sig Dispense  Refill  . OS-CAL 500 + D PO Oral Take 1 tablet by mouth daily.      . ACETAMINOPHEN 650 MG PO TBCR Oral Take 1,300 mg by mouth every 8 (eight) hours as needed. Pain      . ALIGN PO CAPS Oral Take 1 capsule by mouth daily.      . BUDESONIDE 3 MG PO CP24 Oral Take 9 mg by mouth every morning.      Marland Kitchen CLONAZEPAM 1 MG PO TABS Oral Take 1 mg by mouth 3 (three) times daily as needed. anxiety    . DICYCLOMINE HCL 10 MG PO CAPS Oral Take 10 mg by mouth as needed. Muscle spasms    . HYDROCORTISONE 20 MG PO TABS Oral Take 20 mg by mouth every morning.      Marland Kitchen HYDROCORTISONE 20 MG PO TABS Oral Take 10 mg by mouth at bedtime.      Marland Kitchen MESALAMINE 800 MG PO TBEC Oral Take 1 tablet by mouth 2 (two) times daily.      Marland Kitchen MESALAMINE 400 MG PO TBEC Oral Take 800 mg by mouth 2 (two) times daily.      Marland Kitchen ONE-DAILY MULTI VITAMINS PO TABS Oral Take 1 tablet by mouth daily.      . OXYCODONE-ACETAMINOPHEN 5-325 MG PO TABS Oral Take 1 tablet by mouth every 4 (four) hours as needed for pain. 15 tablet 0  . SERTRALINE  HCL 100 MG PO TABS Oral Take 100 mg by mouth daily.        Physical Exam    BP 128/84  Pulse 95  Temp(Src) 97.7 F (36.5 C) (Oral)  Resp 18  SpO2 99%  Physical Exam  Nursing note and vitals reviewed. Constitutional: She is oriented to person, place, and time. She appears well-developed.  HENT:  Head: Normocephalic.  Eyes: Pupils are equal, round, and reactive to light.  Neck: Normal range of motion.  Cardiovascular: Normal rate and regular rhythm.   Pulmonary/Chest: Effort normal and breath sounds normal.  Abdominal: Soft. There is tenderness.  Musculoskeletal: Normal range of motion.  Neurological: She is alert and oriented to person, place, and time.  Skin: Skin is warm and dry.  Psychiatric: She has a normal mood and affect.    ED Course  Procedures (including critical care time)  Labs Reviewed  CBC - Abnormal; Notable for the following:    WBC 11.0 (*)    MCV 103.9 (*)    MCH 35.7  (*)    All other components within normal limits  DIFFERENTIAL - Abnormal; Notable for the following:    Neutrophils Relative 90 (*)    Neutro Abs 9.9 (*)    Lymphocytes Relative 4 (*)    Lymphs Abs 0.5 (*)    All other components within normal limits  COMPREHENSIVE METABOLIC PANEL - Abnormal; Notable for the following:    Glucose, Bld 150 (*)    All other components within normal limits  URINALYSIS, ROUTINE W REFLEX MICROSCOPIC - Abnormal; Notable for the following:    Appearance HAZY (*)    Bilirubin Urine SMALL (*)    Protein, ur TRACE (*)    All other components within normal limits  URINE MICROSCOPIC-ADD ON - Abnormal; Notable for the following:    Squamous Epithelial / LPF FEW (*) MICROSCOPIC EXAM PERFORMED ON UNCONCENTRATED URINE   Bacteria, UA MANY (*)    Casts HYALINE CASTS (*) GRANULAR CAST   Crystals CA OXALATE CRYSTALS (*)    All other components within normal limits  D-DIMER, QUANTITATIVE - Abnormal; Notable for the following:    D-Dimer, Quant 0.58 (*)    All other components within normal limits  PREGNANCY, URINE  URINE CULTURE   No results found.   Date: 12/26/2010  Rate: 75  Rhythm: normal sinus rhythm  QRS Axis: normal  Intervals: normal  ST/T Wave abnormalities: normal  Conduction Disutrbances:none  Narrative Interpretation:   Old EKG Reviewed: none available  No diagnosis found.   MDM On re exam pt c/o tight band feeling around her chest:will check d-dimer:ct scan negative:pt is feeling a little better at this time;will discharge with pain medication:pt has a nausea medication at home:pt is supposed to see her gi doctor tomorrow      Teressa Lower, NP 12/26/10 1843  Teressa Lower, NP 12/26/10 1846

## 2010-12-26 NOTE — ED Notes (Signed)
Water provided to pt for po trial

## 2010-12-26 NOTE — ED Notes (Signed)
Pt reports water "did not stay down"- amb to [facility] at this time; feels like she is going to have diarrhea

## 2010-12-26 NOTE — ED Provider Notes (Signed)
Medical screening examination/treatment/procedure(s) were performed by non-physician practitioner and as supervising physician I was immediately available for consultation/collaboration.   Johnnell Liou, MD 12/26/10 1928 

## 2010-12-26 NOTE — ED Notes (Signed)
Pt tolerated Coke w/o difficulty

## 2010-12-27 LAB — URINE CULTURE
Colony Count: 10000
Culture  Setup Time: 201209232102

## 2010-12-30 ENCOUNTER — Emergency Department (INDEPENDENT_AMBULATORY_CARE_PROVIDER_SITE_OTHER): Payer: 59

## 2010-12-30 ENCOUNTER — Encounter (HOSPITAL_BASED_OUTPATIENT_CLINIC_OR_DEPARTMENT_OTHER): Payer: Self-pay | Admitting: *Deleted

## 2010-12-30 ENCOUNTER — Emergency Department (HOSPITAL_BASED_OUTPATIENT_CLINIC_OR_DEPARTMENT_OTHER)
Admission: EM | Admit: 2010-12-30 | Discharge: 2010-12-30 | Disposition: A | Discharge status: Home / Self Care | Payer: 59 | Attending: Emergency Medicine | Admitting: Emergency Medicine

## 2010-12-30 DIAGNOSIS — R220 Localized swelling, mass and lump, head: Secondary | ICD-10-CM | POA: Insufficient documentation

## 2010-12-30 DIAGNOSIS — W19XXXA Unspecified fall, initial encounter: Secondary | ICD-10-CM

## 2010-12-30 DIAGNOSIS — R221 Localized swelling, mass and lump, neck: Secondary | ICD-10-CM | POA: Insufficient documentation

## 2010-12-30 DIAGNOSIS — IMO0002 Reserved for concepts with insufficient information to code with codable children: Secondary | ICD-10-CM | POA: Insufficient documentation

## 2010-12-30 DIAGNOSIS — S46919A Strain of unspecified muscle, fascia and tendon at shoulder and upper arm level, unspecified arm, initial encounter: Secondary | ICD-10-CM

## 2010-12-30 DIAGNOSIS — S0083XA Contusion of other part of head, initial encounter: Secondary | ICD-10-CM

## 2010-12-30 DIAGNOSIS — M533 Sacrococcygeal disorders, not elsewhere classified: Secondary | ICD-10-CM | POA: Insufficient documentation

## 2010-12-30 DIAGNOSIS — F17200 Nicotine dependence, unspecified, uncomplicated: Secondary | ICD-10-CM | POA: Insufficient documentation

## 2010-12-30 DIAGNOSIS — M2550 Pain in unspecified joint: Secondary | ICD-10-CM

## 2010-12-30 DIAGNOSIS — M542 Cervicalgia: Secondary | ICD-10-CM | POA: Insufficient documentation

## 2010-12-30 MED ORDER — HYDROCODONE-ACETAMINOPHEN 5-325 MG PO TABS
ORAL_TABLET | ORAL | Status: AC
  Administered 2010-12-30: 1
  Filled 2010-12-30: qty 1

## 2010-12-30 MED ORDER — HYDROCODONE-ACETAMINOPHEN 5-500 MG PO TABS: 1.0000 | ORAL_TABLET | Freq: Four times a day (QID) | ORAL | Status: AC | PRN

## 2010-12-30 MED ORDER — ONDANSETRON 4 MG PO TBDP
ORAL_TABLET | ORAL | Status: AC
  Administered 2010-12-30: 4 mg
  Filled 2010-12-30: qty 1

## 2010-12-30 NOTE — ED Provider Notes (Signed)
Medical screening examination/treatment/procedure(s) were performed by non-physician practitioner and as supervising physician I was immediately available for consultation/collaboration.  Jendaya Gossett R. Larua Collier, MD 12/30/10 2300 

## 2010-12-30 NOTE — ED Notes (Signed)
Larey Seat off her horse last night. Injury to her right upper arm. Right eye is swollen and bruised. Nausea and headache. No loc.

## 2010-12-30 NOTE — ED Provider Notes (Signed)
History     CSN: [id number] Arrival date & time: 12/30/2010  4:42 PM  Chief Complaint  Patient presents with  . Fall    (Consider location/radiation/quality/duration/timing/severity/associated sxs/prior treatment) HPI Comments: Pt states that it happened last night:pt states that she has black eye and she we was continuing to have a headache so she decided to come in  Patient is a 45 y.o. female presenting with fall. The history is provided by the patient. No language interpreter was used.  Fall The accident occurred yesterday. Incident: fell off a horse. She fell from a height of 3 to 5 ft. She landed on grass. There was no blood loss. The pain is present in the right shoulder, head and right elbow. The pain is mild. Associated symptoms include nausea and headaches. Pertinent negatives include no vomiting and no loss of consciousness.    Past Medical History  Diagnosis Date  . Colitis   . WPW (Wolff-Parkinson-White syndrome)   . Kidney calculi   . Kidney stone   . Addison disease     Past Surgical History  Procedure Date  . Cholecystectomy   . Tubal ligation   . Lithotripsy     No family history on file.  History  Substance Use Topics  . Smoking status: Current Everyday Smoker -- 1.0 packs/day for 30 years    Types: Cigarettes  . Smokeless tobacco: Not on file  . Alcohol Use: No    OB History    Grav Para Term Preterm Abortions TAB SAB Ect Mult Living   3 3              Review of Systems  Gastrointestinal: Positive for nausea. Negative for vomiting.  Neurological: Positive for headaches. Negative for loss of consciousness.  All other systems reviewed and are negative.    Allergies  Review of patient's allergies indicates no known allergies.  Home Medications   Current Outpatient Rx  Name Route Sig Dispense Refill  . ALIGN PO CAPS Oral Take 1 capsule by mouth daily.      . OS-CAL 500 + D PO Oral Take 1 tablet by mouth daily.      Marland Kitchen CLONAZEPAM 1 MG PO  TABS Oral Take 1 mg by mouth 3 (three) times daily as needed. anxiety    . HYDROCORTISONE 20 MG PO TABS Oral Take 20 mg by mouth every morning.      Marland Kitchen HYDROCORTISONE 20 MG PO TABS Oral Take 10 mg by mouth at bedtime.      Marland Kitchen MESALAMINE 800 MG PO TBEC Oral Take 1 tablet by mouth 2 (two) times daily.      Marland Kitchen ONE-DAILY MULTI VITAMINS PO TABS Oral Take 1 tablet by mouth daily.      . SERTRALINE HCL 100 MG PO TABS Oral Take 100 mg by mouth daily.      . ACETAMINOPHEN 650 MG PO TBCR Oral Take 1,300 mg by mouth every 8 (eight) hours as needed. Pain      . BUDESONIDE 3 MG PO CP24 Oral Take 9 mg by mouth every morning.      Marland Kitchen DICYCLOMINE HCL 10 MG PO CAPS Oral Take 10 mg by mouth as needed. Muscle spasms    . MESALAMINE 400 MG PO TBEC Oral Take 800 mg by mouth 2 (two) times daily.      . OXYCODONE-ACETAMINOPHEN 5-325 MG PO TABS Oral Take 2 tablets by mouth every 4 (four) hours as needed for pain. 10 tablet 0  BP 126/80  Pulse 99  Temp(Src) 98.4 F (36.9 C) (Oral)  Resp 20  SpO2 100%  Physical Exam  Nursing note and vitals reviewed. Constitutional: She is oriented to person, place, and time. She appears well-developed and well-nourished.  HENT:       Pt has bruising around the right eye  Eyes: Conjunctivae and EOM are normal. Pupils are equal, round, and reactive to light.  Neck: Normal range of motion. Neck supple.  Cardiovascular: Normal rate and regular rhythm.   Pulmonary/Chest: Effort normal and breath sounds normal. She exhibits no tenderness.  Musculoskeletal:       Cervical back: She exhibits bony tenderness.       Thoracic back: She exhibits no bony tenderness.       Lumbar back: She exhibits no bony tenderness.       Pt tender in the right shoulder and elbow without any obvious deformity or swelling  Neurological: She is alert and oriented to person, place, and time.  Skin:       Pt has bruising to the right eye    ED Course  Procedures (including critical care time)      Results for orders placed during the hospital encounter of 12/26/10  CBC      Component Value Range   WBC 11.0 (*) 4.0 - 10.5 (K/uL)   RBC 3.87  3.87 - 5.11 (MIL/uL)   Hemoglobin 13.8  12.0 - 15.0 (g/dL)   HCT 40.9  81.1 - 91.4 (%)   MCV 103.9 (*) 78.0 - 100.0 (fL)   MCH 35.7 (*) 26.0 - 34.0 (pg)   MCHC 34.3  30.0 - 36.0 (g/dL)   RDW 78.2  95.6 - 21.3 (%)   Platelets 230  150 - 400 (K/uL)  DIFFERENTIAL      Component Value Range   Neutrophils Relative 90 (*) 43 - 77 (%)   Neutro Abs 9.9 (*) 1.7 - 7.7 (K/uL)   Lymphocytes Relative 4 (*) 12 - 46 (%)   Lymphs Abs 0.5 (*) 0.7 - 4.0 (K/uL)   Monocytes Relative 3  3 - 12 (%)   Monocytes Absolute 0.4  0.1 - 1.0 (K/uL)   Eosinophils Relative 2  0 - 5 (%)   Eosinophils Absolute 0.2  0.0 - 0.7 (K/uL)   Basophils Relative 0  0 - 1 (%)   Basophils Absolute 0.0  0.0 - 0.1 (K/uL)  COMPREHENSIVE METABOLIC PANEL      Component Value Range   Sodium 141  135 - 145 (mEq/L)   Potassium 3.7  3.5 - 5.1 (mEq/L)   Chloride 106  96 - 112 (mEq/L)   CO2 21  19 - 32 (mEq/L)   Glucose, Bld 150 (*) 70 - 99 (mg/dL)   BUN 13  6 - 23 (mg/dL)   Creatinine, Ser 0.86  0.50 - 1.10 (mg/dL)   Calcium 9.6  8.4 - 57.8 (mg/dL)   Total Protein 7.5  6.0 - 8.3 (g/dL)   Albumin 4.2  3.5 - 5.2 (g/dL)   AST 23  0 - 37 (U/L)   ALT 19  0 - 35 (U/L)   Alkaline Phosphatase 101  39 - 117 (U/L)   Total Bilirubin 0.5  0.3 - 1.2 (mg/dL)   GFR calc non Af Amer >60  >60 (mL/min)   GFR calc Af Amer >60  >60 (mL/min)  URINALYSIS, ROUTINE W REFLEX MICROSCOPIC      Component Value Range   Color, Urine YELLOW  YELLOW  Appearance HAZY (*) CLEAR    Specific Gravity, Urine 1.030  1.005 - 1.030    pH 6.0  5.0 - 8.0    Glucose, UA NEGATIVE  NEGATIVE (mg/dL)   Hgb urine dipstick NEGATIVE  NEGATIVE    Bilirubin Urine SMALL (*) NEGATIVE    Ketones, ur NEGATIVE  NEGATIVE (mg/dL)   Protein, ur TRACE (*) NEGATIVE (mg/dL)   Urobilinogen, UA 0.2  0.0 - 1.0 (mg/dL)   Nitrite NEGATIVE   NEGATIVE    Leukocytes, UA NEGATIVE  NEGATIVE   PREGNANCY, URINE      Component Value Range   Preg Test, Ur NEGATIVE    URINE MICROSCOPIC-ADD ON      Component Value Range   Squamous Epithelial / LPF FEW (*) RARE    WBC, UA 0-2  <3 (WBC/hpf)   Bacteria, UA MANY (*) RARE    Casts HYALINE CASTS (*) NEGATIVE    Crystals CA OXALATE CRYSTALS (*) NEGATIVE    Urine-Other MUCOUS PRESENT    URINE CULTURE      Component Value Range   Specimen Description URINE, CATHETERIZED     Special Requests NONE     Setup Time [id number]     Colony Count 10,000 COLONIES/ML     Culture       Value: Multiple bacterial morphotypes present, none predominant. Suggest appropriate recollection if clinically indicated.   Report Status 12/27/2010 FINAL    D-DIMER, QUANTITATIVE      Component Value Range   D-Dimer, Quant 0.58 (*) 0.00 - 0.48 (ug/mL-FEU)   Dg Cervical Spine Complete  12/30/2010  *RADIOLOGY REPORT*  Clinical Data: Pain post fall  CERVICAL SPINE - COMPLETE 4+ VIEW  Comparison: None.  Findings: Multiple missing teeth and restorations. Negative for fracture, dislocation, or other acute bone abnormality.  No prevertebral soft tissue swelling.  No significant degenerative change.   IMPRESSION:  Negative for fracture or other acute abnormality.  Original Report Authenticated By: Osa Craver, M.D.   Dg Sacrum/coccyx  12/30/2010  *RADIOLOGY REPORT*  Clinical Data: Pain post fall  SACRUM AND COCCYX - 2+ VIEW  Comparison: CT 09/27/2010  Findings: Bilateral tubal ligation clips. Negative for fracture, dislocation, or other acute abnormality.  Normal alignment and mineralization. No significant degenerative change.  Regional soft tissues unremarkable.  IMPRESSION:  Negative  Original Report Authenticated By: Osa Craver, M.D.   Dg Shoulder Right  12/30/2010  *RADIOLOGY REPORT*  Clinical Data: Pain post fall  RIGHT SHOULDER - 2+ VIEW  Comparison: None.  Findings: Azygos fissure noted.  Negative for fracture, dislocation, or other acute abnormality.  Normal alignment and mineralization. No significant degenerative change.  Regional soft tissues unremarkable.  IMPRESSION:  Negative  Original Report Authenticated By: Osa Craver, M.D.   Dg Elbow Complete Right  12/30/2010  *RADIOLOGY REPORT*  Clinical Data: Pain post fall  RIGHT ELBOW - COMPLETE 3+ VIEW  Comparison: None.  Findings: No effusion. Negative for fracture, dislocation, or other acute abnormality.  Normal alignment and mineralization. No significant degenerative change.  Regional soft tissues unremarkable.  IMPRESSION:  Negative  Original Report Authenticated By: Osa Craver, M.D.   Ct Head Wo Contrast  12/30/2010  *RADIOLOGY REPORT*  Clinical Data: Fall.  Thrown from horse with bruising and swelling to the right.  CT HEAD WITHOUT CONTRAST  Technique:  Contiguous axial images were obtained from the base of the skull through the vertex without contrast.  Comparison: None.  Findings: There is preseptal  periorbital soft tissue swelling anterior to the right orbit and there is some scalp soft tissue swelling superior and lateral to the right orbit.  Both globes and lenses are intact.  The retrobulbar fat planes of the orbits are intact and symmetric.  The ventricles are normal in size.  Negative for hemorrhage, hydrocephalus, mass effect, mass lesion, or evidence of acute infarction.  The visualized paranasal sinuses, mastoid air cells, and middle ears are clear.  The bones are intact.  IMPRESSION: Preseptal right orbital and right facial soft tissue swelling.  No acute intracranial abnormality.  Original Report Authenticated By: Britta Mccreedy, M.D.   Ct Angio Chest W/cm &/or Wo Cm  12/26/2010  *RADIOLOGY REPORT*  Clinical Data:  Chest pain and elevated D-dimer.  CT ANGIOGRAPHY CHEST WITH CONTRAST  Technique:  Multidetector CT imaging of the chest was performed using the standard protocol during bolus administration  of intravenous contrast.  Multiplanar CT image reconstructions including MIPs were obtained to evaluate the vascular anatomy.  Contrast:  100 ml Omnipaque 350  Comparison:  Chest CTA 10/27/2008  Findings:  This is a technically satisfactory evaluation of the pulmonary arterial tree.  No focal filling defects are identified within the pulmonary arteries.  The main pulmonary arteries are normal in caliber.  Heart size is normal.  The thoracic aorta is normal in contour, caliber, and enhancement.  Negative for pleural or pericardial effusion.  Negative for lymphadenopathy.  Thyroid gland unremarkable.  The trachea mainstem bronchi are patent.  The lungs are well expanded.  There is no airspace disease, pulmonary mass, or interstitial abnormality.  Negative for pneumothorax.  Thoracic spine vertebral bodies are normal in height and alignment. No acute or destructive osseous lesion.  Visualized upper abdomen is unremarkable.  Review of the MIP images confirms the above findings.  IMPRESSION:  1.  Negative for pulmonary embolism. 2.  No acute or suspicious findings in the chest.  Original Report Authenticated By: Britta Mccreedy, M.D.       No diagnosis found.    MDM  No acute findings:pt okay to follow up with her doctor as needed        Teressa Lower, NP 12/30/10 1752

## 2010-12-31 ENCOUNTER — Emergency Department (HOSPITAL_BASED_OUTPATIENT_CLINIC_OR_DEPARTMENT_OTHER)
Admission: EM | Admit: 2010-12-31 | Discharge: 2010-12-31 | Disposition: A | Discharge status: Home / Self Care | Payer: 59 | Attending: Emergency Medicine | Admitting: Emergency Medicine

## 2010-12-31 ENCOUNTER — Emergency Department (INDEPENDENT_AMBULATORY_CARE_PROVIDER_SITE_OTHER): Payer: 59

## 2010-12-31 ENCOUNTER — Encounter (HOSPITAL_BASED_OUTPATIENT_CLINIC_OR_DEPARTMENT_OTHER): Payer: Self-pay | Admitting: Family Medicine

## 2010-12-31 DIAGNOSIS — F17200 Nicotine dependence, unspecified, uncomplicated: Secondary | ICD-10-CM | POA: Insufficient documentation

## 2010-12-31 DIAGNOSIS — R1110 Vomiting, unspecified: Secondary | ICD-10-CM | POA: Insufficient documentation

## 2010-12-31 DIAGNOSIS — Z79899 Other long term (current) drug therapy: Secondary | ICD-10-CM | POA: Insufficient documentation

## 2010-12-31 DIAGNOSIS — R0781 Pleurodynia: Secondary | ICD-10-CM

## 2010-12-31 DIAGNOSIS — R42 Dizziness and giddiness: Secondary | ICD-10-CM | POA: Insufficient documentation

## 2010-12-31 DIAGNOSIS — R0789 Other chest pain: Secondary | ICD-10-CM

## 2010-12-31 MED ORDER — ONDANSETRON 4 MG PO TBDP
ORAL_TABLET | ORAL | Status: AC
  Administered 2010-12-31: 4 mg via ORAL
  Filled 2010-12-31: qty 1

## 2010-12-31 MED ORDER — ONDANSETRON 4 MG PO TBDP
4.0000 mg | ORAL_TABLET | Freq: Once | ORAL | Status: AC
  Administered 2010-12-31: 4 mg via ORAL

## 2010-12-31 NOTE — ED Provider Notes (Signed)
History     CSN: [id number] Arrival date & time: 12/31/2010  1:57 PM  Chief Complaint  Patient presents with  . Dizziness    (Consider location/radiation/quality/duration/timing/severity/associated sxs/prior treatment) HPI Comments: Pt has a history of chronic vomiting and diarrhea:pt states that he symptoms started 2 hours ago:pt state that the diarrhea looks different to her, but she is unsure how:pt states that she is also having right rib pain:pt states that she is not sure is it is related to her fall off the horse 2 days ago:pt states that she was seen yesterday for the fall but wasn't hurting in her ribs  Patient is a 45 y.o. female presenting with vomiting. The history is provided by the patient. No language interpreter was used.  Emesis  This is a chronic problem. The current episode started 1 to 2 hours ago. The problem occurs 2 to 4 times per day. The problem has not changed since onset.The emesis has an appearance of stomach contents. There has been no fever. Associated symptoms include diarrhea. Pertinent negatives include no abdominal pain, no chills, no cough and no fever.    Past Medical History  Diagnosis Date  . Colitis   . WPW (Wolff-Parkinson-White syndrome)   . Kidney calculi   . Kidney stone   . Addison disease     Past Surgical History  Procedure Date  . Cholecystectomy   . Tubal ligation   . Lithotripsy     No family history on file.  History  Substance Use Topics  . Smoking status: Current Everyday Smoker -- 1.0 packs/day for 30 years    Types: Cigarettes  . Smokeless tobacco: Not on file  . Alcohol Use: No    OB History    Grav Para Term Preterm Abortions TAB SAB Ect Mult Living   3 3              Review of Systems  Constitutional: Negative for fever and chills.  Respiratory: Negative for cough.   Gastrointestinal: Positive for vomiting and diarrhea. Negative for abdominal pain.  All other systems reviewed and are  negative.    Allergies  Review of patient's allergies indicates no known allergies.  Home Medications   Current Outpatient Rx  Name Route Sig Dispense Refill  . ACETAMINOPHEN 650 MG PO TBCR Oral Take 1,300 mg by mouth every 8 (eight) hours as needed. Pain      . ALIGN PO CAPS Oral Take 1 capsule by mouth daily.      . BUDESONIDE 3 MG PO CP24 Oral Take 9 mg by mouth every morning.      Marland Kitchen OS-CAL 500 + D PO Oral Take 1 tablet by mouth daily.      Marland Kitchen CLONAZEPAM 1 MG PO TABS Oral Take 1 mg by mouth 3 (three) times daily as needed. anxiety    . DICYCLOMINE HCL 10 MG PO CAPS Oral Take 10 mg by mouth as needed. Muscle spasms    . HYDROCODONE-ACETAMINOPHEN 5-500 MG PO TABS Oral Take 1-2 tablets by mouth every 6 (six) hours as needed for pain. 6 tablet 0  . HYDROCORTISONE 20 MG PO TABS Oral Take 20 mg by mouth every morning.      Marland Kitchen HYDROCORTISONE 20 MG PO TABS Oral Take 10 mg by mouth at bedtime.      Marland Kitchen MESALAMINE 800 MG PO TBEC Oral Take 1 tablet by mouth 2 (two) times daily.      Marland Kitchen MESALAMINE 400 MG PO TBEC Oral Take  800 mg by mouth 2 (two) times daily.      Marland Kitchen ONE-DAILY MULTI VITAMINS PO TABS Oral Take 1 tablet by mouth daily.      . OXYCODONE-ACETAMINOPHEN 5-325 MG PO TABS Oral Take 2 tablets by mouth every 4 (four) hours as needed for pain. 10 tablet 0  . SERTRALINE HCL 100 MG PO TABS Oral Take 100 mg by mouth daily.        BP 129/83  Pulse 79  Temp(Src) 98 F (36.7 C) (Oral)  Resp 18  SpO2 100%  LMP 12/24/2010  Physical Exam  Nursing note and vitals reviewed. Constitutional: She is oriented to person, place, and time. She appears well-developed and well-nourished.  HENT:  Mouth/Throat: Oropharynx is clear and moist.       Pt has bruising around the right eye  Neck: Normal range of motion.  Cardiovascular: Normal rate and regular rhythm.   Pulmonary/Chest: Effort normal and breath sounds normal.  Abdominal: Soft. There is no tenderness. There is no guarding.  Musculoskeletal:  Normal range of motion.  Neurological: She is alert and oriented to person, place, and time.  Skin: Skin is warm and dry.  Psychiatric: She has a normal mood and affect.    ED Course  Procedures (including critical care time) Results for orders placed during the hospital encounter of 12/31/10  OCCULT BLOOD X 1 CARD TO LAB, STOOL      Component Value Range   Fecal Occult Bld NEGATIVE     Dg Ribs Unilateral W/chest Right  12/31/2010  *RADIOLOGY REPORT*  Clinical Data: Fall from horse.  Right lower anterior rib pain.  RIGHT RIBS AND CHEST - 3+ VIEW  Comparison: 12/26/2010 CT scan of the chest  Findings: Cardiac and mediastinal contours appear normal.  The lungs appear clear.  No pleural effusion is identified.  No pneumothorax is observed.  No rib fracture is identified.  IMPRESSION:  1.  No significant abnormality identified. Please note that nondisplaced rib fractures can be occult on conventional radiography.  Original Report Authenticated By: Dellia Cloud, M.D.     No diagnosis found.    MDM  No noted rib fracture:pt not orthostatic:pt not vomiting in the er:pt okay to go home:pt is requesting pain medication in her arm before she leaves, discussed with her that she was not going to be given any narcotics today        Teressa Lower, NP 12/31/10 1521

## 2010-12-31 NOTE — ED Notes (Signed)
Pt c/o dizziness, n/v today. Pt reports right side pain.

## 2010-12-31 NOTE — ED Notes (Signed)
Pt reports that she went to her primary dr dr Irish Lack at [facility] states she was examined and discharged then sent back to work she states she started getting dizzy and having diarrhea and some nausea states she called primary MD and advised her to return to ED

## 2010-12-31 NOTE — ED Notes (Signed)
Right eye bruised above and below purple healing color noted also has right arm in sling noted to remove the arm and use it to get undressed.

## 2011-01-01 NOTE — ED Provider Notes (Signed)
Medical screening examination/treatment/procedure(s) were performed by non-physician practitioner and as supervising physician I was immediately available for consultation/collaboration.   Joya Gaskins, MD 01/01/11 [phone]

## 2011-01-11 ENCOUNTER — Encounter (HOSPITAL_BASED_OUTPATIENT_CLINIC_OR_DEPARTMENT_OTHER): Payer: Self-pay | Admitting: Emergency Medicine

## 2011-01-11 ENCOUNTER — Emergency Department (HOSPITAL_BASED_OUTPATIENT_CLINIC_OR_DEPARTMENT_OTHER)
Admission: EM | Admit: 2011-01-11 | Discharge: 2011-01-11 | Disposition: A | Discharge status: Home / Self Care | Payer: 59 | Attending: Emergency Medicine | Admitting: Emergency Medicine

## 2011-01-11 DIAGNOSIS — E876 Hypokalemia: Secondary | ICD-10-CM | POA: Insufficient documentation

## 2011-01-11 DIAGNOSIS — R531 Weakness: Secondary | ICD-10-CM

## 2011-01-11 DIAGNOSIS — R109 Unspecified abdominal pain: Secondary | ICD-10-CM | POA: Insufficient documentation

## 2011-01-11 DIAGNOSIS — R5381 Other malaise: Secondary | ICD-10-CM | POA: Insufficient documentation

## 2011-01-11 DIAGNOSIS — Z79899 Other long term (current) drug therapy: Secondary | ICD-10-CM | POA: Insufficient documentation

## 2011-01-11 DIAGNOSIS — F17200 Nicotine dependence, unspecified, uncomplicated: Secondary | ICD-10-CM | POA: Insufficient documentation

## 2011-01-11 DIAGNOSIS — R197 Diarrhea, unspecified: Secondary | ICD-10-CM | POA: Insufficient documentation

## 2011-01-11 LAB — CBC
MCH: 36.2 pg — ABNORMAL HIGH (ref 26.0–34.0)
MCHC: 35.5 g/dL (ref 30.0–36.0)
MCV: 101.8 fL — ABNORMAL HIGH (ref 78.0–100.0)
Platelets: 251 10*3/uL (ref 150–400)
RDW: 13.7 % (ref 11.5–15.5)

## 2011-01-11 LAB — COMPREHENSIVE METABOLIC PANEL
AST: 15 U/L (ref 0–37)
Albumin: 3.8 g/dL (ref 3.5–5.2)
Calcium: 8.7 mg/dL (ref 8.4–10.5)
Creatinine, Ser: 0.5 mg/dL (ref 0.50–1.10)
GFR calc non Af Amer: >90 mL/min (ref >90)
Total Protein: 7.2 g/dL (ref 6.0–8.3)

## 2011-01-11 LAB — LIPASE, BLOOD: Lipase: 30 U/L (ref 11–59)

## 2011-01-11 MED ORDER — HYDROCORTISONE SOD SUCCINATE 100 MG IJ SOLR
100.0000 mg | Freq: Once | INTRAMUSCULAR | Status: AC
  Administered 2011-01-11: 100 mg via INTRAVENOUS
  Filled 2011-01-11: qty 2

## 2011-01-11 MED ORDER — HYDROMORPHONE HCL 1 MG/ML IJ SOLN
1.0000 mg | Freq: Once | INTRAMUSCULAR | Status: AC
  Administered 2011-01-11: 1 mg via INTRAVENOUS
  Filled 2011-01-11: qty 1

## 2011-01-11 MED ORDER — METOCLOPRAMIDE HCL 5 MG/ML IJ SOLN
10.0000 mg | Freq: Once | INTRAMUSCULAR | Status: AC
  Administered 2011-01-11: 10 mg via INTRAVENOUS
  Filled 2011-01-11: qty 2

## 2011-01-11 MED ORDER — PROMETHAZINE HCL 25 MG/ML IJ SOLN
50.0000 mg | Freq: Once | INTRAMUSCULAR | Status: AC
  Administered 2011-01-11: 50 mg via INTRAMUSCULAR
  Filled 2011-01-11: qty 2

## 2011-01-11 MED ORDER — SODIUM CHLORIDE 0.9 % IV BOLUS (SEPSIS)
1000.0000 mL | Freq: Once | INTRAVENOUS | Status: AC
  Administered 2011-01-11: 1000 mL via INTRAVENOUS

## 2011-01-11 MED ORDER — ONDANSETRON HCL 4 MG/2ML IJ SOLN
4.0000 mg | Freq: Once | INTRAMUSCULAR | Status: AC
  Administered 2011-01-11: 4 mg via INTRAVENOUS
  Filled 2011-01-11: qty 2

## 2011-01-11 MED ORDER — SODIUM CHLORIDE 0.9 % IV SOLN
1000.0000 mL | INTRAVENOUS | Status: AC
  Administered 2011-01-11: 1000 mL via INTRAVENOUS

## 2011-01-11 NOTE — ED Provider Notes (Signed)
History     CSN: [id number] Arrival date & time: 01/11/2011  7:20 PM  Chief Complaint  Patient presents with  . Weakness  . Altered Mental Status  . Emesis  . Diarrhea    (Consider location/radiation/quality/duration/timing/severity/associated sxs/prior treatment) HPI This 45 year old white female ran out of her hydrocortisone for 4 days but started retaking it today. Today however she feels lightheaded, dry mouth, more diarrhea than usual, and thought she might have been a little bit confused earlier but was not really confused and is not confused now. She has no fever no localized neurologic symptoms no shortness of breath no chest pain, there is no change in her baseline chronic abdominal pain, she is worse than usual diarrhea today which is nonbloody. She's had no vomiting. She talked her endocrinologist who recommended she come to the emergency department for IV fluids and IV hydrocortisone. The patient does not want to be admitted if possible. Past Medical History  Diagnosis Date  . Colitis   . WPW (Wolff-Parkinson-White syndrome)   . Kidney calculi   . Kidney stone   . Addison disease     Past Surgical History  Procedure Date  . Cholecystectomy   . Tubal ligation   . Lithotripsy     No family history on file.  History  Substance Use Topics  . Smoking status: Current Everyday Smoker -- 1.0 packs/day for 30 years    Types: Cigarettes  . Smokeless tobacco: Not on file  . Alcohol Use: No    OB History    Grav Para Term Preterm Abortions TAB SAB Ect Mult Living   3 3              Review of Systems  Constitutional: Positive for fatigue. Negative for fever.       10 Systems reviewed and are negative for acute change except as noted in the HPI.  HENT: Negative for congestion.   Eyes: Negative for discharge and redness.  Respiratory: Negative for cough and shortness of breath.   Cardiovascular: Negative for chest pain.  Gastrointestinal: Positive for abdominal  pain and diarrhea. Negative for vomiting.  Musculoskeletal: Negative for back pain.  Skin: Negative for rash.  Neurological: Positive for light-headedness. Negative for syncope, numbness and headaches.  Psychiatric/Behavioral:       No behavior change.    Allergies  Review of patient's allergies indicates no known allergies.  Home Medications   Current Outpatient Rx  Name Route Sig Dispense Refill  . HYDROCORTISONE 10 MG PO TABS Oral Take 10 mg by mouth 2 (two) times daily.      Marland Kitchen MESALAMINE 800 MG PO TBEC Oral Take 1 tablet by mouth 2 (two) times daily.      Marland Kitchen ONE-DAILY MULTI VITAMINS PO TABS Oral Take 1 tablet by mouth daily.      Marland Kitchen PANTOPRAZOLE SODIUM 40 MG PO TBEC Oral Take 40 mg by mouth daily.      . SERTRALINE HCL 100 MG PO TABS Oral Take 100 mg by mouth daily.      . ACETAMINOPHEN ER 650 MG PO TBCR Oral Take 1,300 mg by mouth every 8 (eight) hours as needed. Pain      . ALIGN PO CAPS Oral Take 1 capsule by mouth daily.      . BUDESONIDE 3 MG PO CP24 Oral Take 9 mg by mouth every morning.      Marland Kitchen OS-CAL 500 + D PO Oral Take 1 tablet by mouth daily.      Marland Kitchen  CLONAZEPAM 1 MG PO TABS Oral Take 1 mg by mouth 3 (three) times daily as needed. anxiety    . DICYCLOMINE HCL 10 MG PO CAPS Oral Take 10 mg by mouth as needed. Muscle spasms    . HYDROCORTISONE 20 MG PO TABS Oral Take 20 mg by mouth every morning.      Marland Kitchen HYDROCORTISONE 20 MG PO TABS Oral Take 10 mg by mouth at bedtime.      Marland Kitchen MESALAMINE 400 MG PO TBEC Oral Take 800 mg by mouth 2 (two) times daily.        BP 107/67  Pulse 74  Temp(Src) 97.5 F (36.4 C) (Oral)  Resp 18  SpO2 100%  LMP 12/24/2010  Physical Exam  Nursing note and vitals reviewed. Constitutional: She is oriented to person, place, and time.       Awake, alert, nontoxic appearance.  HENT:  Head: Atraumatic.  Eyes: Right eye exhibits no discharge. Left eye exhibits no discharge.  Neck: Normal range of motion. Neck supple.  Cardiovascular: Regular  rhythm.   No murmur heard.      Tachycardic  Pulmonary/Chest: Effort normal and breath sounds normal. No respiratory distress. She has no wheezes. She has no rales. She exhibits no tenderness.  Abdominal: Soft. Bowel sounds are normal. There is tenderness. There is no rebound and no guarding.       Mild diffuse tenderness without rebound  Musculoskeletal: She exhibits no tenderness.       Baseline ROM, no obvious new focal weakness.  Lymphadenopathy:    She has no cervical adenopathy.  Neurological: She is alert and oriented to person, place, and time.       Mental status and motor strength appears baseline for patient and situation.  Skin: No rash noted.  Psychiatric: She has a normal mood and affect.    ED Course  Procedures (including critical care time) The patient feels improved enough for discharge after IV hydration and IV steroids in the emergency department. She is offered potassium supplementation in the emergency department but prefers to take an extra 40 mEq of potassium when she gets home tonight. Labs Reviewed  CBC - Abnormal; Notable for the following:    RBC 3.40 (*)    HCT 34.6 (*)    MCV 101.8 (*)    MCH 36.2 (*)    All other components within normal limits  COMPREHENSIVE METABOLIC PANEL - Abnormal; Notable for the following:    Potassium 3.1 (*)    Alkaline Phosphatase 123 (*)    Total Bilirubin 0.1 (*)    All other components within normal limits  LIPASE, BLOOD   No results found.   1. Weakness generalized   2. Chronic abdominal pain   3. Hypokalemia       MDM  I doubt any other EMC precluding discharge at this time including, but not necessarily limited to the following:severe adrenal crisis.        Hurman Horn, MD 01/18/11 [phone]

## 2011-01-11 NOTE — ED Notes (Signed)
Pt requesting additional pain/nausea meds. Will notify MD

## 2011-01-11 NOTE — ED Notes (Signed)
Pt called her Endocrinologist today d/t symptoms, and was told to come to ER "to get IV hydrocortisone and fluids and check my labs. My potassium is usually low".

## 2011-01-11 NOTE — ED Notes (Signed)
Pt states she has been out of hydrocordisone x x 4days. Talked to endocrinologist today and had prescription refilled but told to come to ED.

## 2011-01-11 NOTE — ED Notes (Signed)
Warm blankets given.

## 2011-01-11 NOTE — ED Notes (Signed)
Unable to urinate at this time. Will notify staff when ready.

## 2011-01-11 NOTE — ED Notes (Signed)
Dr. Bednar at bedside. 

## 2011-01-11 NOTE — ED Notes (Signed)
Pt unable to urinate at this time.  

## 2011-01-23 ENCOUNTER — Emergency Department (HOSPITAL_BASED_OUTPATIENT_CLINIC_OR_DEPARTMENT_OTHER)
Admission: EM | Admit: 2011-01-23 | Discharge: 2011-01-23 | Disposition: A | Discharge status: Home / Self Care | Payer: 59 | Attending: Emergency Medicine | Admitting: Emergency Medicine

## 2011-01-23 ENCOUNTER — Encounter (HOSPITAL_BASED_OUTPATIENT_CLINIC_OR_DEPARTMENT_OTHER): Payer: Self-pay | Admitting: *Deleted

## 2011-01-23 DIAGNOSIS — R112 Nausea with vomiting, unspecified: Secondary | ICD-10-CM | POA: Insufficient documentation

## 2011-01-23 DIAGNOSIS — G8929 Other chronic pain: Secondary | ICD-10-CM | POA: Insufficient documentation

## 2011-01-23 DIAGNOSIS — R1013 Epigastric pain: Secondary | ICD-10-CM | POA: Insufficient documentation

## 2011-01-23 HISTORY — DX: Disorder of thyroid, unspecified: E07.9

## 2011-01-23 LAB — CBC
Hemoglobin: 14.7 g/dL (ref 12.0–15.0)
MCH: 34.8 pg — ABNORMAL HIGH (ref 26.0–34.0)
MCHC: 33.7 g/dL (ref 30.0–36.0)
RDW: 13.3 % (ref 11.5–15.5)

## 2011-01-23 LAB — URINALYSIS, ROUTINE W REFLEX MICROSCOPIC
Glucose, UA: NEGATIVE mg/dL
Ketones, ur: NEGATIVE mg/dL
Leukocytes, UA: NEGATIVE
Nitrite: NEGATIVE
Protein, ur: NEGATIVE mg/dL
Urobilinogen, UA: 0.2 mg/dL (ref 0.0–1.0)

## 2011-01-23 LAB — DIFFERENTIAL
Basophils Absolute: 0 10*3/uL (ref 0.0–0.1)
Basophils Relative: 0 % (ref 0–1)
Eosinophils Absolute: 0.2 10*3/uL (ref 0.0–0.7)
Monocytes Relative: 4 % (ref 3–12)
Neutro Abs: 14.4 10*3/uL — ABNORMAL HIGH (ref 1.7–7.7)
Neutrophils Relative %: 83 % — ABNORMAL HIGH (ref 43–77)

## 2011-01-23 LAB — COMPREHENSIVE METABOLIC PANEL
AST: 18 U/L (ref 0–37)
Albumin: 4.4 g/dL (ref 3.5–5.2)
BUN: 11 mg/dL (ref 6–23)
Creatinine, Ser: 0.6 mg/dL (ref 0.50–1.10)
Potassium: 3.1 mEq/L — ABNORMAL LOW (ref 3.5–5.1)
Total Protein: 8 g/dL (ref 6.0–8.3)

## 2011-01-23 LAB — URINE MICROSCOPIC-ADD ON

## 2011-01-23 LAB — LIPASE, BLOOD: Lipase: 20 U/L (ref 11–59)

## 2011-01-23 LAB — PREGNANCY, URINE: Preg Test, Ur: NEGATIVE

## 2011-01-23 MED ORDER — OXYCODONE-ACETAMINOPHEN 5-325 MG PO TABS: 1.0000 | ORAL_TABLET | ORAL | Status: DC | PRN

## 2011-01-23 MED ORDER — HYDROMORPHONE HCL 1 MG/ML IJ SOLN
1.0000 mg | Freq: Once | INTRAMUSCULAR | Status: AC
  Administered 2011-01-23: 1 mg via INTRAVENOUS
  Filled 2011-01-23: qty 1

## 2011-01-23 MED ORDER — PANTOPRAZOLE SODIUM 40 MG IV SOLR
40.0000 mg | Freq: Once | INTRAVENOUS | Status: AC
  Administered 2011-01-23: 40 mg via INTRAVENOUS
  Filled 2011-01-23: qty 40

## 2011-01-23 MED ORDER — SODIUM CHLORIDE 0.9 % IV SOLN: 999.0000 mL | INTRAVENOUS | Status: DC

## 2011-01-23 MED ORDER — HYDROCORTISONE SOD SUCCINATE 100 MG IJ SOLR
100.0000 mg | Freq: Once | INTRAMUSCULAR | Status: AC
  Administered 2011-01-23: 100 mg via INTRAVENOUS
  Filled 2011-01-23: qty 2

## 2011-01-23 MED ORDER — POTASSIUM CHLORIDE 10 MEQ/100ML IV SOLN: 10.0000 meq | Freq: Once | INTRAVENOUS | Status: DC

## 2011-01-23 MED ORDER — HYDROMORPHONE HCL 1 MG/ML IJ SOLN
0.5000 mg | Freq: Once | INTRAMUSCULAR | Status: AC
  Administered 2011-01-23: 0.5 mg via INTRAVENOUS
  Filled 2011-01-23: qty 1

## 2011-01-23 MED ORDER — ONDANSETRON 4 MG PO TBDP
4.0000 mg | ORAL_TABLET | Freq: Three times a day (TID) | ORAL | Status: DC | PRN
  Administered 2011-01-23: 4 mg via ORAL
  Filled 2011-01-23: qty 1

## 2011-01-23 MED ORDER — LORAZEPAM 2 MG/ML IJ SOLN
0.5000 mg | Freq: Once | INTRAMUSCULAR | Status: AC
  Administered 2011-01-23: 0.5 mg via INTRAVENOUS
  Filled 2011-01-23: qty 1

## 2011-01-23 MED ORDER — ONDANSETRON HCL 4 MG/2ML IJ SOLN
4.0000 mg | Freq: Once | INTRAMUSCULAR | Status: AC
  Administered 2011-01-23: 4 mg via INTRAVENOUS
  Filled 2011-01-23: qty 2

## 2011-01-23 MED ORDER — POTASSIUM CHLORIDE CRYS ER 20 MEQ PO TBCR: 20.0000 meq | EXTENDED_RELEASE_TABLET | Freq: Two times a day (BID) | ORAL | Status: DC

## 2011-01-23 MED ORDER — SODIUM CHLORIDE 0.9 % IV SOLN
Freq: Once | INTRAVENOUS | Status: AC
  Administered 2011-01-23: 18:00:00 via INTRAVENOUS

## 2011-01-23 MED ORDER — SODIUM CHLORIDE 0.9 % IV SOLN
999.0000 mL | INTRAVENOUS | Status: DC
  Administered 2011-01-23 (×2): via INTRAVENOUS
  Administered 2011-01-23: 1000 mL via INTRAVENOUS

## 2011-01-23 MED ORDER — POTASSIUM CHLORIDE CRYS ER 20 MEQ PO TBCR
40.0000 meq | EXTENDED_RELEASE_TABLET | Freq: Once | ORAL | Status: AC
  Administered 2011-01-23: 40 meq via ORAL
  Filled 2011-01-23 (×2): qty 1

## 2011-01-23 MED ORDER — METOCLOPRAMIDE HCL 5 MG/ML IJ SOLN
10.0000 mg | Freq: Once | INTRAMUSCULAR | Status: AC
  Administered 2011-01-23: 10 mg via INTRAVENOUS
  Filled 2011-01-23: qty 2

## 2011-01-23 MED ORDER — ONDANSETRON HCL 4 MG/2ML IJ SOLN: 4.0000 mg | Freq: Once | INTRAMUSCULAR | Status: DC

## 2011-01-23 NOTE — ED Provider Notes (Signed)
History     CSN: [id number] Arrival date & time: 01/23/2011  6:01 PM   First MD Initiated Contact with Patient 01/23/11 1802      Chief Complaint  Patient presents with  . Emesis  . Abdominal Pain    (Consider location/radiation/quality/duration/timing/severity/associated sxs/prior treatment) HPI  Past Medical History  Diagnosis Date  . Colitis   . WPW (Wolff-Parkinson-White syndrome)   . Kidney calculi   . Kidney stone   . Addison disease   . Thyroid disease   . Crohn's colitis     Past Surgical History  Procedure Date  . Cholecystectomy   . Tubal ligation   . Lithotripsy     No family history on file.  History  Substance Use Topics  . Smoking status: Current Everyday Smoker -- 1.0 packs/day for 30 years    Types: Cigarettes  . Smokeless tobacco: Not on file  . Alcohol Use: No    OB History    Grav Para Term Preterm Abortions TAB SAB Ect Mult Living   3 3              Review of Systems  Allergies  Zofran  Home Medications   Current Outpatient Rx  Name Route Sig Dispense Refill  . ALIGN PO CAPS Oral Take 1 capsule by mouth daily.      . BUDESONIDE 3 MG PO CP24 Oral Take 9 mg by mouth every morning.      Marland Kitchen OS-CAL 500 + D PO Oral Take 1 tablet by mouth daily.      Marland Kitchen CLONAZEPAM 1 MG PO TABS Oral Take 1 mg by mouth 3 (three) times daily as needed. anxiety    . DICYCLOMINE HCL 10 MG PO CAPS Oral Take 10 mg by mouth as needed. Muscle spasms    . HYDROCORTISONE 10 MG PO TABS Oral Take 10 mg by mouth 2 (two) times daily.      Marland Kitchen LEVOTHYROXINE SODIUM 50 MCG PO TABS Oral Take 50 mcg by mouth daily.      Marland Kitchen MESALAMINE 800 MG PO TBEC Oral Take 1 tablet by mouth 2 (two) times daily.      Marland Kitchen ONE-DAILY MULTI VITAMINS PO TABS Oral Take 1 tablet by mouth daily.      Marland Kitchen PANTOPRAZOLE SODIUM 40 MG PO TBEC Oral Take 40 mg by mouth daily.      . SERTRALINE HCL 100 MG PO TABS Oral Take 100 mg by mouth daily.      . ACETAMINOPHEN ER 650 MG PO TBCR Oral Take 1,300 mg by  mouth every 8 (eight) hours as needed. Pain      . HYDROCORTISONE 20 MG PO TABS Oral Take 20 mg by mouth every morning.      Marland Kitchen HYDROCORTISONE 20 MG PO TABS Oral Take 10 mg by mouth at bedtime.      Marland Kitchen MESALAMINE 400 MG PO TBEC Oral Take 800 mg by mouth 2 (two) times daily.      . OXYCODONE-ACETAMINOPHEN 5-325 MG PO TABS Oral Take 1 tablet by mouth every 4 (four) hours as needed for pain. 20 tablet 0  . POTASSIUM CHLORIDE CRYS CR 20 MEQ PO TBCR Oral Take 1 tablet (20 mEq total) by mouth 2 (two) times daily. 30 tablet 0    BP 123/88  Pulse 92  Temp(Src) 98 F (36.7 C) (Oral)  Resp 20  Ht 5\' 4"  (1.626 m)  Wt 129 lb (58.514 kg)  BMI 22.14 kg/m2  SpO2 97%  LMP 12/24/2010  Physical Exam  ED Course  Procedures (including critical care time)  Labs Reviewed  URINALYSIS, ROUTINE W REFLEX MICROSCOPIC - Abnormal; Notable for the following:    Appearance CLOUDY (*)    Hgb urine dipstick TRACE (*)    All other components within normal limits  CBC - Abnormal; Notable for the following:    WBC 17.4 (*)    MCV 103.3 (*)    MCH 34.8 (*)    All other components within normal limits  DIFFERENTIAL - Abnormal; Notable for the following:    Neutrophils Relative 83 (*)    Neutro Abs 14.4 (*)    All other components within normal limits  COMPREHENSIVE METABOLIC PANEL - Abnormal; Notable for the following:    Potassium 3.1 (*)    Glucose, Bld 106 (*)    Alkaline Phosphatase 141 (*)    Total Bilirubin 0.2 (*)    All other components within normal limits  URINE MICROSCOPIC-ADD ON - Abnormal; Notable for the following:    Squamous Epithelial / LPF MANY (*)    Bacteria, UA MANY (*)    All other components within normal limits  PREGNANCY, URINE  LIPASE, BLOOD   No results found.   1. Nausea & vomiting   2. Abdominal pain, chronic, epigastric     9:28 PM She is feeling better.  Will prescribe KCL 20 meq daily, and also a prescription for Percocet and Zofran.  She has adrenal  insufficiency and recurrent bouts of abdominal pain.  Here exam tonight shows no mass or point tenderness in the abdomen.  She has followup of her abdominal pain with specialists at [facility] in 3 days.   Medical screening examination/treatment/procedure(s) were conducted as a shared visit with non-physician practitioner(s) and myself.  I personally evaluated the patient during the encounter Osvaldo Human, M.D.  Carleene Cooper III, MD 01/23/11 2121  Carleene Cooper III, MD 01/23/11 2128  Carleene Cooper III, MD 01/25/11 [phone]

## 2011-01-23 NOTE — ED Provider Notes (Signed)
History     CSN: [id number] Arrival date & time: 01/23/2011  6:01 PM   First MD Initiated Contact with Patient 01/23/11 1802      Chief Complaint  Patient presents with  . Emesis  . Abdominal Pain    (Consider location/radiation/quality/duration/timing/severity/associated sxs/prior treatment) Patient is a 45 y.o. female presenting with vomiting and abdominal pain. The history is provided by the patient.  Emesis  This is a recurrent problem. The current episode started 2 days ago. The problem occurs 5 to 10 times per day. The emesis has an appearance of stomach contents. There has been no fever. Associated symptoms include abdominal pain and diarrhea. Pertinent negatives include no chills and no fever.  Abdominal Pain The primary symptoms of the illness include abdominal pain, vomiting and diarrhea. The primary symptoms of the illness do not include fever.  Symptoms associated with the illness do not include chills.    Past Medical History  Diagnosis Date  . Colitis   . WPW (Wolff-Parkinson-White syndrome)   . Kidney calculi   . Kidney stone   . Addison disease   . Thyroid disease   . Crohn's colitis     Past Surgical History  Procedure Date  . Cholecystectomy   . Tubal ligation   . Lithotripsy     No family history on file.  History  Substance Use Topics  . Smoking status: Current Everyday Smoker -- 1.0 packs/day for 30 years    Types: Cigarettes  . Smokeless tobacco: Not on file  . Alcohol Use: No    OB History    Grav Para Term Preterm Abortions TAB SAB Ect Mult Living   3 3              Review of Systems  Constitutional: Positive for appetite change. Negative for fever and chills.  Gastrointestinal: Positive for vomiting, abdominal pain and diarrhea.  Neurological: Negative for weakness.    Allergies  Zofran  Home Medications   Current Outpatient Rx  Name Route Sig Dispense Refill  . ACETAMINOPHEN ER 650 MG PO TBCR Oral Take 1,300 mg by mouth  every 8 (eight) hours as needed. Pain      . ALIGN PO CAPS Oral Take 1 capsule by mouth daily.      . BUDESONIDE 3 MG PO CP24 Oral Take 9 mg by mouth every morning.      Marland Kitchen OS-CAL 500 + D PO Oral Take 1 tablet by mouth daily.      Marland Kitchen CLONAZEPAM 1 MG PO TABS Oral Take 1 mg by mouth 3 (three) times daily as needed. anxiety    . DICYCLOMINE HCL 10 MG PO CAPS Oral Take 10 mg by mouth as needed. Muscle spasms    . HYDROCORTISONE 10 MG PO TABS Oral Take 10 mg by mouth 2 (two) times daily.      Marland Kitchen HYDROCORTISONE 20 MG PO TABS Oral Take 20 mg by mouth every morning.      Marland Kitchen HYDROCORTISONE 20 MG PO TABS Oral Take 10 mg by mouth at bedtime.      Marland Kitchen MESALAMINE 800 MG PO TBEC Oral Take 1 tablet by mouth 2 (two) times daily.      Marland Kitchen MESALAMINE 400 MG PO TBEC Oral Take 800 mg by mouth 2 (two) times daily.      Marland Kitchen ONE-DAILY MULTI VITAMINS PO TABS Oral Take 1 tablet by mouth daily.      Marland Kitchen PANTOPRAZOLE SODIUM 40 MG PO TBEC Oral Take 40  mg by mouth daily.      . SERTRALINE HCL 100 MG PO TABS Oral Take 100 mg by mouth daily.        Temp(Src) 97.8 F (36.6 C) (Oral)  Ht 5\' 4"  (1.626 m)  Wt 129 lb (58.514 kg)  BMI 22.14 kg/m2  LMP 12/24/2010  Physical Exam  Constitutional: She is oriented to person, place, and time. She appears well-developed.  HENT:  Head: Normocephalic.  Eyes: Pupils are equal, round, and reactive to light.  Neck: Normal range of motion.  Cardiovascular: Tachycardia present.   Abdominal: Soft. There is tenderness. There is no guarding.  Musculoskeletal: Normal range of motion.  Neurological: She is alert and oriented to person, place, and time.  Skin: Skin is warm and dry.  Psychiatric: Her speech is normal. Her mood appears anxious.    ED Course  Procedures (including critical care time) Results for orders placed during the hospital encounter of 01/23/11  URINALYSIS, ROUTINE W REFLEX MICROSCOPIC      Component Value Range   Color, Urine YELLOW  YELLOW    Appearance CLOUDY (*)  CLEAR    Specific Gravity, Urine 1.018  1.005 - 1.030    pH 6.0  5.0 - 8.0    Glucose, UA NEGATIVE  NEGATIVE (mg/dL)   Hgb urine dipstick TRACE (*) NEGATIVE    Bilirubin Urine NEGATIVE  NEGATIVE    Ketones, ur NEGATIVE  NEGATIVE (mg/dL)   Protein, ur NEGATIVE  NEGATIVE (mg/dL)   Urobilinogen, UA 0.2  0.0 - 1.0 (mg/dL)   Nitrite NEGATIVE  NEGATIVE    Leukocytes, UA NEGATIVE  NEGATIVE   PREGNANCY, URINE      Component Value Range   Preg Test, Ur NEGATIVE    CBC      Component Value Range   WBC 17.4 (*) 4.0 - 10.5 (K/uL)   RBC 4.22  3.87 - 5.11 (MIL/uL)   Hemoglobin 14.7  12.0 - 15.0 (g/dL)   HCT 09.8  11.9 - 14.7 (%)   MCV 103.3 (*) 78.0 - 100.0 (fL)   MCH 34.8 (*) 26.0 - 34.0 (pg)   MCHC 33.7  30.0 - 36.0 (g/dL)   RDW 82.9  56.2 - 13.0 (%)   Platelets 345  150 - 400 (K/uL)  DIFFERENTIAL      Component Value Range   Neutrophils Relative 83 (*) 43 - 77 (%)   Neutro Abs 14.4 (*) 1.7 - 7.7 (K/uL)   Lymphocytes Relative 12  12 - 46 (%)   Lymphs Abs 2.1  0.7 - 4.0 (K/uL)   Monocytes Relative 4  3 - 12 (%)   Monocytes Absolute 0.7  0.1 - 1.0 (K/uL)   Eosinophils Relative 1  0 - 5 (%)   Eosinophils Absolute 0.2  0.0 - 0.7 (K/uL)   Basophils Relative 0  0 - 1 (%)   Basophils Absolute 0.0  0.0 - 0.1 (K/uL)  COMPREHENSIVE METABOLIC PANEL      Component Value Range   Sodium 141  135 - 145 (mEq/L)   Potassium 3.1 (*) 3.5 - 5.1 (mEq/L)   Chloride 105  96 - 112 (mEq/L)   CO2 22  19 - 32 (mEq/L)   Glucose, Bld 106 (*) 70 - 99 (mg/dL)   BUN 11  6 - 23 (mg/dL)   Creatinine, Ser 8.65  0.50 - 1.10 (mg/dL)   Calcium 9.9  8.4 - 78.4 (mg/dL)   Total Protein 8.0  6.0 - 8.3 (g/dL)   Albumin 4.4  3.5 - 5.2 (g/dL)   AST 18  0 - 37 (U/L)   ALT 15  0 - 35 (U/L)   Alkaline Phosphatase 141 (*) 39 - 117 (U/L)   Total Bilirubin 0.2 (*) 0.3 - 1.2 (mg/dL)   GFR calc non Af Amer >90  >90 (mL/min)   GFR calc Af Amer >90  >90 (mL/min)  LIPASE, BLOOD      Component Value Range   Lipase 20  11 - 59  (U/L)  URINE MICROSCOPIC-ADD ON      Component Value Range   Squamous Epithelial / LPF MANY (*) RARE    WBC, UA 0-2  <3 (WBC/hpf)   RBC / HPF 3-6  <3 (RBC/hpf)   Bacteria, UA MANY (*) RARE    Urine-Other MUCOUS PRESENT     Dg Ribs Unilateral W/chest Right  12/31/2010  *RADIOLOGY REPORT*  Clinical Data: Fall from horse.  Right lower anterior rib pain.  RIGHT RIBS AND CHEST - 3+ VIEW  Comparison: 12/26/2010 CT scan of the chest  Findings: Cardiac and mediastinal contours appear normal.  The lungs appear clear.  No pleural effusion is identified.  No pneumothorax is observed.  No rib fracture is identified.  IMPRESSION:  1.  No significant abnormality identified. Please note that nondisplaced rib fractures can be occult on conventional radiography.  Original Report Authenticated By: Dellia Cloud, M.D.   Dg Cervical Spine Complete  12/30/2010  *RADIOLOGY REPORT*  Clinical Data: Pain post fall  CERVICAL SPINE - COMPLETE 4+ VIEW  Comparison: None.  Findings: Multiple missing teeth and restorations. Negative for fracture, dislocation, or other acute bone abnormality.  No prevertebral soft tissue swelling.  No significant degenerative change.   IMPRESSION:  Negative for fracture or other acute abnormality.  Original Report Authenticated By: Osa Craver, M.D.   Dg Sacrum/coccyx  12/30/2010  *RADIOLOGY REPORT*  Clinical Data: Pain post fall  SACRUM AND COCCYX - 2+ VIEW  Comparison: CT 09/27/2010  Findings: Bilateral tubal ligation clips. Negative for fracture, dislocation, or other acute abnormality.  Normal alignment and mineralization. No significant degenerative change.  Regional soft tissues unremarkable.  IMPRESSION:  Negative  Original Report Authenticated By: Osa Craver, M.D.   Dg Shoulder Right  12/30/2010  *RADIOLOGY REPORT*  Clinical Data: Pain post fall  RIGHT SHOULDER - 2+ VIEW  Comparison: None.  Findings: Azygos fissure noted. Negative for fracture, dislocation, or  other acute abnormality.  Normal alignment and mineralization. No significant degenerative change.  Regional soft tissues unremarkable.  IMPRESSION:  Negative  Original Report Authenticated By: Osa Craver, M.D.   Dg Elbow Complete Right  12/30/2010  *RADIOLOGY REPORT*  Clinical Data: Pain post fall  RIGHT ELBOW - COMPLETE 3+ VIEW  Comparison: None.  Findings: No effusion. Negative for fracture, dislocation, or other acute abnormality.  Normal alignment and mineralization. No significant degenerative change.  Regional soft tissues unremarkable.  IMPRESSION:  Negative  Original Report Authenticated By: Osa Craver, M.D.   Ct Head Wo Contrast  12/30/2010  *RADIOLOGY REPORT*  Clinical Data: Fall.  Thrown from horse with bruising and swelling to the right.  CT HEAD WITHOUT CONTRAST  Technique:  Contiguous axial images were obtained from the base of the skull through the vertex without contrast.  Comparison: None.  Findings: There is preseptal periorbital soft tissue swelling anterior to the right orbit and there is some scalp soft tissue swelling superior and lateral to the right orbit.  Both globes  and lenses are intact.  The retrobulbar fat planes of the orbits are intact and symmetric.  The ventricles are normal in size.  Negative for hemorrhage, hydrocephalus, mass effect, mass lesion, or evidence of acute infarction.  The visualized paranasal sinuses, mastoid air cells, and middle ears are clear.  The bones are intact.  IMPRESSION: Preseptal right orbital and right facial soft tissue swelling.  No acute intracranial abnormality.  Original Report Authenticated By: Britta Mccreedy, M.D.   Ct Angio Chest W/cm &/or Wo Cm  12/26/2010  *RADIOLOGY REPORT*  Clinical Data:  Chest pain and elevated D-dimer.  CT ANGIOGRAPHY CHEST WITH CONTRAST  Technique:  Multidetector CT imaging of the chest was performed using the standard protocol during bolus administration of intravenous contrast.  Multiplanar  CT image reconstructions including MIPs were obtained to evaluate the vascular anatomy.  Contrast:  100 ml Omnipaque 350  Comparison:  Chest CTA 10/27/2008  Findings:  This is a technically satisfactory evaluation of the pulmonary arterial tree.  No focal filling defects are identified within the pulmonary arteries.  The main pulmonary arteries are normal in caliber.  Heart size is normal.  The thoracic aorta is normal in contour, caliber, and enhancement.  Negative for pleural or pericardial effusion.  Negative for lymphadenopathy.  Thyroid gland unremarkable.  The trachea mainstem bronchi are patent.  The lungs are well expanded.  There is no airspace disease, pulmonary mass, or interstitial abnormality.  Negative for pneumothorax.  Thoracic spine vertebral bodies are normal in height and alignment. No acute or destructive osseous lesion.  Visualized upper abdomen is unremarkable.  Review of the MIP images confirms the above findings.  IMPRESSION:  1.  Negative for pulmonary embolism. 2.  No acute or suspicious findings in the chest.  Original Report Authenticated By: Britta Mccreedy, M.D.    visit  Labs Reviewed - No data to display No results found. Results for orders placed during the hospital encounter of 01/23/11  URINALYSIS, ROUTINE W REFLEX MICROSCOPIC      Component Value Range   Color, Urine YELLOW  YELLOW    Appearance CLOUDY (*) CLEAR    Specific Gravity, Urine 1.018  1.005 - 1.030    pH 6.0  5.0 - 8.0    Glucose, UA NEGATIVE  NEGATIVE (mg/dL)   Hgb urine dipstick TRACE (*) NEGATIVE    Bilirubin Urine NEGATIVE  NEGATIVE    Ketones, ur NEGATIVE  NEGATIVE (mg/dL)   Protein, ur NEGATIVE  NEGATIVE (mg/dL)   Urobilinogen, UA 0.2  0.0 - 1.0 (mg/dL)   Nitrite NEGATIVE  NEGATIVE    Leukocytes, UA NEGATIVE  NEGATIVE   PREGNANCY, URINE      Component Value Range   Preg Test, Ur NEGATIVE    CBC      Component Value Range   WBC 17.4 (*) 4.0 - 10.5 (K/uL)   RBC 4.22  3.87 - 5.11 (MIL/uL)    Hemoglobin 14.7  12.0 - 15.0 (g/dL)   HCT 16.1  09.6 - 04.5 (%)   MCV 103.3 (*) 78.0 - 100.0 (fL)   MCH 34.8 (*) 26.0 - 34.0 (pg)   MCHC 33.7  30.0 - 36.0 (g/dL)   RDW 40.9  81.1 - 91.4 (%)   Platelets 345  150 - 400 (K/uL)  DIFFERENTIAL      Component Value Range   Neutrophils Relative 83 (*) 43 - 77 (%)   Neutro Abs 14.4 (*) 1.7 - 7.7 (K/uL)   Lymphocytes Relative 12  12 - 46 (%)  Lymphs Abs 2.1  0.7 - 4.0 (K/uL)   Monocytes Relative 4  3 - 12 (%)   Monocytes Absolute 0.7  0.1 - 1.0 (K/uL)   Eosinophils Relative 1  0 - 5 (%)   Eosinophils Absolute 0.2  0.0 - 0.7 (K/uL)   Basophils Relative 0  0 - 1 (%)   Basophils Absolute 0.0  0.0 - 0.1 (K/uL)  URINE MICROSCOPIC-ADD ON      Component Value Range   Squamous Epithelial / LPF MANY (*) RARE    WBC, UA 0-2  <3 (WBC/hpf)   RBC / HPF 3-6  <3 (RBC/hpf)   Bacteria, UA MANY (*) RARE    Urine-Other MUCOUS PRESENT     Dg Ribs Unilateral W/chest Right  12/31/2010  *RADIOLOGY REPORT*  Clinical Data: Fall from horse.  Right lower anterior rib pain.  RIGHT RIBS AND CHEST - 3+ VIEW  Comparison: 12/26/2010 CT scan of the chest  Findings: Cardiac and mediastinal contours appear normal.  The lungs appear clear.  No pleural effusion is identified.  No pneumothorax is observed.  No rib fracture is identified.  IMPRESSION:  1.  No significant abnormality identified. Please note that nondisplaced rib fractures can be occult on conventional radiography.  Original Report Authenticated By: Dellia Cloud, M.D.   Dg Cervical Spine Complete  12/30/2010  *RADIOLOGY REPORT*  Clinical Data: Pain post fall  CERVICAL SPINE - COMPLETE 4+ VIEW  Comparison: None.  Findings: Multiple missing teeth and restorations. Negative for fracture, dislocation, or other acute bone abnormality.  No prevertebral soft tissue swelling.  No significant degenerative change.   IMPRESSION:  Negative for fracture or other acute abnormality.  Original Report Authenticated By: Osa Craver, M.D.   Dg Sacrum/coccyx  12/30/2010  *RADIOLOGY REPORT*  Clinical Data: Pain post fall  SACRUM AND COCCYX - 2+ VIEW  Comparison: CT 09/27/2010  Findings: Bilateral tubal ligation clips. Negative for fracture, dislocation, or other acute abnormality.  Normal alignment and mineralization. No significant degenerative change.  Regional soft tissues unremarkable.  IMPRESSION:  Negative  Original Report Authenticated By: Osa Craver, M.D.   Dg Shoulder Right  12/30/2010  *RADIOLOGY REPORT*  Clinical Data: Pain post fall  RIGHT SHOULDER - 2+ VIEW  Comparison: None.  Findings: Azygos fissure noted. Negative for fracture, dislocation, or other acute abnormality.  Normal alignment and mineralization. No significant degenerative change.  Regional soft tissues unremarkable.  IMPRESSION:  Negative  Original Report Authenticated By: Osa Craver, M.D.   Dg Elbow Complete Right  12/30/2010  *RADIOLOGY REPORT*  Clinical Data: Pain post fall  RIGHT ELBOW - COMPLETE 3+ VIEW  Comparison: None.  Findings: No effusion. Negative for fracture, dislocation, or other acute abnormality.  Normal alignment and mineralization. No significant degenerative change.  Regional soft tissues unremarkable.  IMPRESSION:  Negative  Original Report Authenticated By: Osa Craver, M.D.   Ct Head Wo Contrast  12/30/2010  *RADIOLOGY REPORT*  Clinical Data: Fall.  Thrown from horse with bruising and swelling to the right.  CT HEAD WITHOUT CONTRAST  Technique:  Contiguous axial images were obtained from the base of the skull through the vertex without contrast.  Comparison: None.  Findings: There is preseptal periorbital soft tissue swelling anterior to the right orbit and there is some scalp soft tissue swelling superior and lateral to the right orbit.  Both globes and lenses are intact.  The retrobulbar fat planes of the orbits are intact and symmetric.  The ventricles are normal in size.  Negative  for hemorrhage, hydrocephalus, mass effect, mass lesion, or evidence of acute infarction.  The visualized paranasal sinuses, mastoid air cells, and middle ears are clear.  The bones are intact.  IMPRESSION: Preseptal right orbital and right facial soft tissue swelling.  No acute intracranial abnormality.  Original Report Authenticated By: Britta Mccreedy, M.D.   Ct Angio Chest W/cm &/or Wo Cm  12/26/2010  *RADIOLOGY REPORT*  Clinical Data:  Chest pain and elevated D-dimer.  CT ANGIOGRAPHY CHEST WITH CONTRAST  Technique:  Multidetector CT imaging of the chest was performed using the standard protocol during bolus administration of intravenous contrast.  Multiplanar CT image reconstructions including MIPs were obtained to evaluate the vascular anatomy.  Contrast:  100 ml Omnipaque 350  Comparison:  Chest CTA 10/27/2008  Findings:  This is a technically satisfactory evaluation of the pulmonary arterial tree.  No focal filling defects are identified within the pulmonary arteries.  The main pulmonary arteries are normal in caliber.  Heart size is normal.  The thoracic aorta is normal in contour, caliber, and enhancement.  Negative for pleural or pericardial effusion.  Negative for lymphadenopathy.  Thyroid gland unremarkable.  The trachea mainstem bronchi are patent.  The lungs are well expanded.  There is no airspace disease, pulmonary mass, or interstitial abnormality.  Negative for pneumothorax.  Thoracic spine vertebral bodies are normal in height and alignment. No acute or destructive osseous lesion.  Visualized upper abdomen is unremarkable.  Review of the MIP images confirms the above findings.  IMPRESSION:  1.  Negative for pulmonary embolism. 2.  No acute or suspicious findings in the chest.  Original Report Authenticated By: Britta Mccreedy, M.D.      No diagnosis found.    MDM  This is a patient with multiple GI complaints recently started evaluation at [facility] by a GI specialist remainder of the test are  pending No new medications have been added to her regime she does have a history of Addison's disease-I have given her a stress dose of hydrocortisone, IV fluids, antiemetics and pain control the goal being to get the patient to be able to tolerate fluids and follow up with her primary care provider tomorrow.  7:19 PM Reevaluated patient who is still slightly nauseated but no longer vomiting pain has improved  8:04 PM Patient now with increased nausea and pain will odred additional antiemetic, pain control and protonix  Results for orders placed during the hospital encounter of 01/23/11  URINALYSIS, ROUTINE W REFLEX MICROSCOPIC      Component Value Range   Color, Urine YELLOW  YELLOW    Appearance CLOUDY (*) CLEAR    Specific Gravity, Urine 1.018  1.005 - 1.030    pH 6.0  5.0 - 8.0    Glucose, UA NEGATIVE  NEGATIVE (mg/dL)   Hgb urine dipstick TRACE (*) NEGATIVE    Bilirubin Urine NEGATIVE  NEGATIVE    Ketones, ur NEGATIVE  NEGATIVE (mg/dL)   Protein, ur NEGATIVE  NEGATIVE (mg/dL)   Urobilinogen, UA 0.2  0.0 - 1.0 (mg/dL)   Nitrite NEGATIVE  NEGATIVE    Leukocytes, UA NEGATIVE  NEGATIVE   PREGNANCY, URINE      Component Value Range   Preg Test, Ur NEGATIVE    CBC      Component Value Range   WBC 17.4 (*) 4.0 - 10.5 (K/uL)   RBC 4.22  3.87 - 5.11 (MIL/uL)   Hemoglobin 14.7  12.0 - 15.0 (g/dL)  HCT 43.6  36.0 - 46.0 (%)   MCV 103.3 (*) 78.0 - 100.0 (fL)   MCH 34.8 (*) 26.0 - 34.0 (pg)   MCHC 33.7  30.0 - 36.0 (g/dL)   RDW 16.1  09.6 - 04.5 (%)   Platelets 345  150 - 400 (K/uL)  DIFFERENTIAL      Component Value Range   Neutrophils Relative 83 (*) 43 - 77 (%)   Neutro Abs 14.4 (*) 1.7 - 7.7 (K/uL)   Lymphocytes Relative 12  12 - 46 (%)   Lymphs Abs 2.1  0.7 - 4.0 (K/uL)   Monocytes Relative 4  3 - 12 (%)   Monocytes Absolute 0.7  0.1 - 1.0 (K/uL)   Eosinophils Relative 1  0 - 5 (%)   Eosinophils Absolute 0.2  0.0 - 0.7 (K/uL)   Basophils Relative 0  0 - 1 (%)   Basophils  Absolute 0.0  0.0 - 0.1 (K/uL)  COMPREHENSIVE METABOLIC PANEL      Component Value Range   Sodium 141  135 - 145 (mEq/L)   Potassium 3.1 (*) 3.5 - 5.1 (mEq/L)   Chloride 105  96 - 112 (mEq/L)   CO2 22  19 - 32 (mEq/L)   Glucose, Bld 106 (*) 70 - 99 (mg/dL)   BUN 11  6 - 23 (mg/dL)   Creatinine, Ser 4.09  0.50 - 1.10 (mg/dL)   Calcium 9.9  8.4 - 81.1 (mg/dL)   Total Protein 8.0  6.0 - 8.3 (g/dL)   Albumin 4.4  3.5 - 5.2 (g/dL)   AST 18  0 - 37 (U/L)   ALT 15  0 - 35 (U/L)   Alkaline Phosphatase 141 (*) 39 - 117 (U/L)   Total Bilirubin 0.2 (*) 0.3 - 1.2 (mg/dL)   GFR calc non Af Amer >90  >90 (mL/min)   GFR calc Af Amer >90  >90 (mL/min)  LIPASE, BLOOD      Component Value Range   Lipase 20  11 - 59 (U/L)  URINE MICROSCOPIC-ADD ON      Component Value Range   Squamous Epithelial / LPF MANY (*) RARE    WBC, UA 0-2  <3 (WBC/hpf)   RBC / HPF 3-6  <3 (RBC/hpf)   Bacteria, UA MANY (*) RARE    Urine-Other MUCOUS PRESENT     Dg Ribs Unilateral W/chest Right  12/31/2010  *RADIOLOGY REPORT*  Clinical Data: Fall from horse.  Right lower anterior rib pain.  RIGHT RIBS AND CHEST - 3+ VIEW  Comparison: 12/26/2010 CT scan of the chest  Findings: Cardiac and mediastinal contours appear normal.  The lungs appear clear.  No pleural effusion is identified.  No pneumothorax is observed.  No rib fracture is identified.  IMPRESSION:  1.  No significant abnormality identified. Please note that nondisplaced rib fractures can be occult on conventional radiography.  Original Report Authenticated By: Dellia Cloud, M.D.   Dg Cervical Spine Complete  12/30/2010  *RADIOLOGY REPORT*  Clinical Data: Pain post fall  CERVICAL SPINE - COMPLETE 4+ VIEW  Comparison: None.  Findings: Multiple missing teeth and restorations. Negative for fracture, dislocation, or other acute bone abnormality.  No prevertebral soft tissue swelling.  No significant degenerative change.   IMPRESSION:  Negative for fracture or other  acute abnormality.  Original Report Authenticated By: Osa Craver, M.D.   Dg Sacrum/coccyx  12/30/2010  *RADIOLOGY REPORT*  Clinical Data: Pain post fall  SACRUM AND COCCYX - 2+ VIEW  Comparison: CT 09/27/2010  Findings: Bilateral tubal ligation clips. Negative for fracture, dislocation, or other acute abnormality.  Normal alignment and mineralization. No significant degenerative change.  Regional soft tissues unremarkable.  IMPRESSION:  Negative  Original Report Authenticated By: Osa Craver, M.D.   Dg Shoulder Right  12/30/2010  *RADIOLOGY REPORT*  Clinical Data: Pain post fall  RIGHT SHOULDER - 2+ VIEW  Comparison: None.  Findings: Azygos fissure noted. Negative for fracture, dislocation, or other acute abnormality.  Normal alignment and mineralization. No significant degenerative change.  Regional soft tissues unremarkable.  IMPRESSION:  Negative  Original Report Authenticated By: Osa Craver, M.D.   Dg Elbow Complete Right  12/30/2010  *RADIOLOGY REPORT*  Clinical Data: Pain post fall  RIGHT ELBOW - COMPLETE 3+ VIEW  Comparison: None.  Findings: No effusion. Negative for fracture, dislocation, or other acute abnormality.  Normal alignment and mineralization. No significant degenerative change.  Regional soft tissues unremarkable.  IMPRESSION:  Negative  Original Report Authenticated By: Osa Craver, M.D.   Ct Head Wo Contrast  12/30/2010  *RADIOLOGY REPORT*  Clinical Data: Fall.  Thrown from horse with bruising and swelling to the right.  CT HEAD WITHOUT CONTRAST  Technique:  Contiguous axial images were obtained from the base of the skull through the vertex without contrast.  Comparison: None.  Findings: There is preseptal periorbital soft tissue swelling anterior to the right orbit and there is some scalp soft tissue swelling superior and lateral to the right orbit.  Both globes and lenses are intact.  The retrobulbar fat planes of the orbits are intact and  symmetric.  The ventricles are normal in size.  Negative for hemorrhage, hydrocephalus, mass effect, mass lesion, or evidence of acute infarction.  The visualized paranasal sinuses, mastoid air cells, and middle ears are clear.  The bones are intact.  IMPRESSION: Preseptal right orbital and right facial soft tissue swelling.  No acute intracranial abnormality.  Original Report Authenticated By: Britta Mccreedy, M.D.   Ct Angio Chest W/cm &/or Wo Cm  12/26/2010  *RADIOLOGY REPORT*  Clinical Data:  Chest pain and elevated D-dimer.  CT ANGIOGRAPHY CHEST WITH CONTRAST  Technique:  Multidetector CT imaging of the chest was performed using the standard protocol during bolus administration of intravenous contrast.  Multiplanar CT image reconstructions including MIPs were obtained to evaluate the vascular anatomy.  Contrast:  100 ml Omnipaque 350  Comparison:  Chest CTA 10/27/2008  Findings:  This is a technically satisfactory evaluation of the pulmonary arterial tree.  No focal filling defects are identified within the pulmonary arteries.  The main pulmonary arteries are normal in caliber.  Heart size is normal.  The thoracic aorta is normal in contour, caliber, and enhancement.  Negative for pleural or pericardial effusion.  Negative for lymphadenopathy.  Thyroid gland unremarkable.  The trachea mainstem bronchi are patent.  The lungs are well expanded.  There is no airspace disease, pulmonary mass, or interstitial abnormality.  Negative for pneumothorax.  Thoracic spine vertebral bodies are normal in height and alignment. No acute or destructive osseous lesion.  Visualized upper abdomen is unremarkable.  Review of the MIP images confirms the above findings.  IMPRESSION:  1.  Negative for pulmonary embolism. 2.  No acute or suspicious findings in the chest.  Original Report Authenticated By: Britta Mccreedy, M.D.   . 9:33 PM Patient is feeling better. Will released with a prescription for Percocet and potassium chloride.  She is to followup with her doctors  at [facility] in 3 days.   Arman Filter, NP 01/23/11 1920 8:04 PM  Arman Filter, NP 01/23/11 2005  Arman Filter, NP 01/23/11 2014  Carleene Cooper III, MD 01/23/11 2133

## 2011-01-23 NOTE — ED Notes (Signed)
Vomiting, abdominal pain and increased diarrhea more than her norm (per pt report) since Friday after noon

## 2011-01-27 ENCOUNTER — Encounter (HOSPITAL_BASED_OUTPATIENT_CLINIC_OR_DEPARTMENT_OTHER): Payer: Self-pay | Admitting: Family Medicine

## 2011-01-27 ENCOUNTER — Inpatient Hospital Stay (HOSPITAL_COMMUNITY)
Admission: AD | Admit: 2011-01-27 | Discharge: 2011-02-11 | DRG: 392 | Disposition: A | Discharge status: Home / Self Care | Payer: 59 | Source: Other Acute Inpatient Hospital | Attending: Internal Medicine | Admitting: Internal Medicine

## 2011-01-27 ENCOUNTER — Emergency Department (INDEPENDENT_AMBULATORY_CARE_PROVIDER_SITE_OTHER): Payer: 59

## 2011-01-27 ENCOUNTER — Emergency Department (HOSPITAL_BASED_OUTPATIENT_CLINIC_OR_DEPARTMENT_OTHER)
Admission: EM | Admit: 2011-01-27 | Discharge: 2011-01-27 | Disposition: A | Discharge status: Other Acute Inpatient Hospital | Payer: 59 | Source: Home / Self Care | Attending: Emergency Medicine | Admitting: Emergency Medicine

## 2011-01-27 DIAGNOSIS — K529 Noninfective gastroenteritis and colitis, unspecified: Secondary | ICD-10-CM

## 2011-01-27 DIAGNOSIS — E2740 Unspecified adrenocortical insufficiency: Secondary | ICD-10-CM | POA: Diagnosis present

## 2011-01-27 DIAGNOSIS — T380X5A Adverse effect of glucocorticoids and synthetic analogues, initial encounter: Secondary | ICD-10-CM | POA: Diagnosis present

## 2011-01-27 DIAGNOSIS — D649 Anemia, unspecified: Secondary | ICD-10-CM | POA: Insufficient documentation

## 2011-01-27 DIAGNOSIS — F3289 Other specified depressive episodes: Secondary | ICD-10-CM | POA: Diagnosis present

## 2011-01-27 DIAGNOSIS — R197 Diarrhea, unspecified: Secondary | ICD-10-CM

## 2011-01-27 DIAGNOSIS — K208 Other esophagitis without bleeding: Secondary | ICD-10-CM | POA: Diagnosis present

## 2011-01-27 DIAGNOSIS — N200 Calculus of kidney: Secondary | ICD-10-CM

## 2011-01-27 DIAGNOSIS — E860 Dehydration: Secondary | ICD-10-CM | POA: Insufficient documentation

## 2011-01-27 DIAGNOSIS — N83209 Unspecified ovarian cyst, unspecified side: Secondary | ICD-10-CM

## 2011-01-27 DIAGNOSIS — R1110 Vomiting, unspecified: Secondary | ICD-10-CM | POA: Insufficient documentation

## 2011-01-27 DIAGNOSIS — E876 Hypokalemia: Secondary | ICD-10-CM

## 2011-01-27 DIAGNOSIS — K5289 Other specified noninfective gastroenteritis and colitis: Secondary | ICD-10-CM | POA: Insufficient documentation

## 2011-01-27 DIAGNOSIS — R1011 Right upper quadrant pain: Secondary | ICD-10-CM | POA: Diagnosis present

## 2011-01-27 DIAGNOSIS — F329 Major depressive disorder, single episode, unspecified: Secondary | ICD-10-CM | POA: Diagnosis present

## 2011-01-27 DIAGNOSIS — E039 Hypothyroidism, unspecified: Secondary | ICD-10-CM | POA: Diagnosis present

## 2011-01-27 DIAGNOSIS — F411 Generalized anxiety disorder: Secondary | ICD-10-CM | POA: Diagnosis present

## 2011-01-27 DIAGNOSIS — E2749 Other adrenocortical insufficiency: Secondary | ICD-10-CM | POA: Diagnosis present

## 2011-01-27 DIAGNOSIS — R109 Unspecified abdominal pain: Secondary | ICD-10-CM

## 2011-01-27 DIAGNOSIS — Z8711 Personal history of peptic ulcer disease: Secondary | ICD-10-CM

## 2011-01-27 DIAGNOSIS — K209 Esophagitis, unspecified without bleeding: Secondary | ICD-10-CM | POA: Clinically undetermined

## 2011-01-27 DIAGNOSIS — K52832 Lymphocytic colitis: Secondary | ICD-10-CM | POA: Diagnosis present

## 2011-01-27 DIAGNOSIS — IMO0002 Reserved for concepts with insufficient information to code with codable children: Secondary | ICD-10-CM | POA: Diagnosis present

## 2011-01-27 DIAGNOSIS — K219 Gastro-esophageal reflux disease without esophagitis: Secondary | ICD-10-CM | POA: Diagnosis present

## 2011-01-27 DIAGNOSIS — M129 Arthropathy, unspecified: Secondary | ICD-10-CM | POA: Diagnosis present

## 2011-01-27 DIAGNOSIS — Z87442 Personal history of urinary calculi: Secondary | ICD-10-CM

## 2011-01-27 HISTORY — DX: Enterocolitis due to Clostridium difficile, not specified as recurrent: A04.72

## 2011-01-27 LAB — URINE MICROSCOPIC-ADD ON

## 2011-01-27 LAB — URINALYSIS, ROUTINE W REFLEX MICROSCOPIC
Bilirubin Urine: NEGATIVE
Ketones, ur: NEGATIVE mg/dL
Protein, ur: NEGATIVE mg/dL
Urobilinogen, UA: 0.2 mg/dL (ref 0.0–1.0)

## 2011-01-27 LAB — COMPREHENSIVE METABOLIC PANEL
ALT: 9 U/L (ref 0–35)
AST: 12 U/L (ref 0–37)
Albumin: 3.4 g/dL — ABNORMAL LOW (ref 3.5–5.2)
CO2: 24 mEq/L (ref 19–32)
Calcium: 8.3 mg/dL — ABNORMAL LOW (ref 8.4–10.5)
Chloride: 109 mEq/L (ref 96–112)
Creatinine, Ser: 0.5 mg/dL (ref 0.50–1.10)
Sodium: 144 mEq/L (ref 135–145)

## 2011-01-27 LAB — CBC
MCH: 35.1 pg — ABNORMAL HIGH (ref 26.0–34.0)
MCV: 103 fL — ABNORMAL HIGH (ref 78.0–100.0)
Platelets: 234 10*3/uL (ref 150–400)
RBC: 3.33 MIL/uL — ABNORMAL LOW (ref 3.87–5.11)
RDW: 12.8 % (ref 11.5–15.5)
WBC: 11.3 10*3/uL — ABNORMAL HIGH (ref 4.0–10.5)

## 2011-01-27 MED ORDER — PROMETHAZINE HCL 25 MG/ML IJ SOLN
25.0000 mg | Freq: Once | INTRAMUSCULAR | Status: AC
  Administered 2011-01-27: 25 mg via INTRAMUSCULAR
  Filled 2011-01-27: qty 1

## 2011-01-27 MED ORDER — POTASSIUM CHLORIDE 10 MEQ/100ML IV SOLN
10.0000 meq | Freq: Once | INTRAVENOUS | Status: AC
  Administered 2011-01-27: 10 meq via INTRAVENOUS
  Filled 2011-01-27: qty 100

## 2011-01-27 MED ORDER — METRONIDAZOLE IN NACL 5-0.79 MG/ML-% IV SOLN
500.0000 mg | Freq: Once | INTRAVENOUS | Status: AC
  Administered 2011-01-27: 500 mg via INTRAVENOUS
  Filled 2011-01-27: qty 100

## 2011-01-27 MED ORDER — HYDROMORPHONE HCL 1 MG/ML IJ SOLN
INTRAMUSCULAR | Status: AC
  Administered 2011-01-27: 20:00:00 via INTRAVENOUS
  Filled 2011-01-27: qty 1

## 2011-01-27 MED ORDER — PROMETHAZINE HCL 25 MG/ML IJ SOLN
25.0000 mg | Freq: Once | INTRAMUSCULAR | Status: AC
  Administered 2011-01-27: 25 mg via INTRAVENOUS
  Filled 2011-01-27: qty 1

## 2011-01-27 MED ORDER — VANCOMYCIN HCL IN DEXTROSE 1-5 GM/200ML-% IV SOLN
1000.0000 mg | Freq: Once | INTRAVENOUS | Status: AC
  Administered 2011-01-27: 1000 mg via INTRAVENOUS

## 2011-01-27 MED ORDER — IOHEXOL 300 MG/ML  SOLN
100.0000 mL | Freq: Once | INTRAMUSCULAR | Status: AC | PRN
  Administered 2011-01-27: 100 mL via INTRAVENOUS

## 2011-01-27 MED ORDER — CIPROFLOXACIN IN D5W 400 MG/200ML IV SOLN
400.0000 mg | Freq: Once | INTRAVENOUS | Status: AC
  Administered 2011-01-27: 400 mg via INTRAVENOUS
  Filled 2011-01-27: qty 200

## 2011-01-27 MED ORDER — SODIUM CHLORIDE 0.9 % IV BOLUS (SEPSIS)
1000.0000 mL | Freq: Once | INTRAVENOUS | Status: AC
  Administered 2011-01-27: 1000 mL via INTRAVENOUS

## 2011-01-27 NOTE — ED Notes (Signed)
Has remained alert and oriented.  Up to BR x 2 and tolerated well.  Up to bsc x1.  Called family about admission.  Report called to nurse at [facility]

## 2011-01-27 NOTE — ED Provider Notes (Signed)
History     CSN: [id number] Arrival date & time: 01/27/2011  3:51 PM   First MD Initiated Contact with Patient 01/27/11 1540      Chief Complaint  Patient presents with  . Emesis  . Diarrhea    (Consider location/radiation/quality/duration/timing/severity/associated sxs/prior treatment) HPI Patient presents with complaint of vomiting diarrhea as well as abdominal pain. She has a history of colitis and irritable bowel and states that her diarrhea has increased from approximately 10 episodes daily 2/20 episodes today. Diarrhea described as watery and nonbloody. She denies any fever or chills. States that she cannot able to keep down liquids today due to vomiting. She also states that her GI physician at [facility] called to inform her of a positive C. difficile test which was performed October 17. She had a prescription called in for oral vancomycin which is not filled and does not believe she'll be able to take due to her vomiting. Her abdominal pain is diffuse and worse on the left side.  Past Medical History  Diagnosis Date  . Colitis   . WPW (Wolff-Parkinson-White syndrome)   . Kidney calculi   . Kidney stone   . Addison disease   . Thyroid disease   . Crohn's colitis   . Clostridium difficile diarrhea     Past Surgical History  Procedure Date  . Cholecystectomy   . Tubal ligation   . Lithotripsy     No family history on file.  History  Substance Use Topics  . Smoking status: Current Everyday Smoker -- 1.0 packs/day for 30 years    Types: Cigarettes  . Smokeless tobacco: Not on file  . Alcohol Use: No    OB History    Grav Para Term Preterm Abortions TAB SAB Ect Mult Living   3 3              Review of Systems ROS reviewed and otherwise negative except for mentioned in HPI  Allergies  Zofran  Home Medications   Current Outpatient Rx  Name Route Sig Dispense Refill  . ALIGN PO CAPS Oral Take 1 capsule by mouth daily.      Marland Kitchen CLONAZEPAM 1 MG PO TABS Oral Take  1 mg by mouth 3 (three) times daily as needed. anxiety    . HYDROCORTISONE 20 MG PO TABS Oral Take 20 mg by mouth 2 (two) times daily.     Marland Kitchen LEVOTHYROXINE SODIUM 50 MCG PO TABS Oral Take 50 mcg by mouth daily.      Marland Kitchen MESALAMINE 800 MG PO TBEC Oral Take 1 tablet by mouth 2 (two) times daily.      Marland Kitchen MESALAMINE 400 MG PO TBEC Oral Take 800 mg by mouth 2 (two) times daily.      Marland Kitchen ONE-DAILY MULTI VITAMINS PO TABS Oral Take 1 tablet by mouth daily.      Marland Kitchen PANTOPRAZOLE SODIUM 40 MG PO TBEC Oral Take 40 mg by mouth daily.      Marland Kitchen POTASSIUM CHLORIDE CRYS CR 20 MEQ PO TBCR Oral Take 1 tablet (20 mEq total) by mouth 2 (two) times daily. 30 tablet 0  . SERTRALINE HCL 100 MG PO TABS Oral Take 100 mg by mouth daily.      . ACETAMINOPHEN ER 650 MG PO TBCR Oral Take 1,300 mg by mouth every 8 (eight) hours as needed. Pain      . BUDESONIDE 3 MG PO CP24 Oral Take 9 mg by mouth every morning.      Marland Kitchen  OS-CAL 500 + D PO Oral Take 1 tablet by mouth daily.      Marland Kitchen DICYCLOMINE HCL 10 MG PO CAPS Oral Take 10 mg by mouth as needed. Muscle spasms    . HYDROCORTISONE 10 MG PO TABS Oral Take 10 mg by mouth 2 (two) times daily.      Marland Kitchen HYDROCORTISONE 20 MG PO TABS Oral Take 10 mg by mouth at bedtime.      . OXYCODONE-ACETAMINOPHEN 5-325 MG PO TABS Oral Take 1 tablet by mouth every 4 (four) hours as needed for pain. 20 tablet 0    BP 130/85  Pulse 92  Temp(Src) 98.3 F (36.8 C) (Oral)  Resp 18  Ht 5\' 4"  (1.626 m)  Wt 129 lb (58.514 kg)  BMI 22.14 kg/m2  SpO2 100%  LMP 12/24/2010 Vital signs reviewed  Physical Exam Physical Examination: General appearance - chronically ill appearing, uncomfortable, no acute distress Mental status - alert, oriented to person, place, and time Mouth - mucous membranes dry, OP clear Chest - clear to auscultation, no wheezes, rales or rhonchi, symmetric air entry Heart - normal rate, regular rhythm, normal S1, S2, no murmurs, rubs, clicks or gallops Abdomen - soft, nondistended, diffuse  ttp- worse over left abdomen, no gaurding or rebound Musculoskeletal - no joint tenderness, deformity or swelling Extremities - peripheral pulses normal, no pedal edema, no clubbing or cyanosis Skin - warm and dry, poor skin turgor, cap refill approx 3 seconds, no rash  ED Course  Procedures (including critical care time) 6:39 PM D/w [facility] at [facility] for transfer/admission- Dr. Izola Price accepting, pt to go to Team 3.  Labs Reviewed  URINALYSIS, ROUTINE W REFLEX MICROSCOPIC - Abnormal; Notable for the following:    Appearance CLOUDY (*)    Hgb urine dipstick SMALL (*)    All other components within normal limits  CBC - Abnormal; Notable for the following:    WBC 11.3 (*)    RBC 3.33 (*)    Hemoglobin 11.7 (*)    HCT 34.3 (*)    MCV 103.0 (*)    MCH 35.1 (*)    All other components within normal limits  COMPREHENSIVE METABOLIC PANEL - Abnormal; Notable for the following:    Potassium 2.8 (*)    Glucose, Bld 108 (*)    Calcium 8.3 (*)    Albumin 3.4 (*)    Total Bilirubin <0.1 (*)    All other components within normal limits  URINE MICROSCOPIC-ADD ON - Abnormal; Notable for the following:    Squamous Epithelial / LPF FEW (*)    Bacteria, UA MANY (*)    All other components within normal limits  LIPASE, BLOOD  STOOL CULTURE  CLOSTRIDIUM DIFFICILE EIA  CLOSTRIDIUM DIFFICILE BY PCR  URINE CULTURE   Ct Abdomen Pelvis W Contrast  01/27/2011  *RADIOLOGY REPORT*  Clinical Data: Abdominal pain.  Diarrhea.  CT ABDOMEN AND PELVIS WITH CONTRAST  Technique:  Multidetector CT imaging of the abdomen and pelvis was performed following the standard protocol during bolus administration of intravenous contrast.  Contrast: OMNIPAQUE IOHEXOL 300 MG/ML IV SOLN  Comparison: CT scan 09/27/2010.  Findings: The lung bases are clear.  There is diffuse fatty infiltration of the liver but no focal hepatic lesions or intrahepatic biliary dilatation.  The gallbladder is surgically absent.  No common  bile duct dilatation. The pancreas is unremarkable.  The spleen is normal in size.  No focal lesions.  The adrenal glands and kidneys are normal except  for small bilateral renal calculi.  No obstructing ureteral calculi.  The stomach is unremarkable.  Mild contraction of the pylorus is noted the duodenum and small bowel are unremarkable.  There is moderate scattered fluid throughout the colon without obvious formed stool.  No pericolonic inflammatory changes.  No mass.  The appendix is normal.  Radiopaque densities are noted in the cecum. Fecalith versus ingested material.  The uterus and ovaries are unremarkable.  A small right ovarian cyst is noted.  There is fluid in the rectum and sigmoid colon. Tubal ligation clips are noted.  No pelvic mass, adenopathy or free pelvic fluid collection.  No inguinal mass or hernia.  IMPRESSION:  1.  Mild diffuse inflammatory changes involving the colon with scattered fluid. 2.  Bilateral renal calculi but no obstructing ureteral calculi. 3.  Diffuse fatty infiltration of the liver.  Original Report Authenticated By: P. Loralie Champagne, M.D.     1. Colitis   2. Vomiting and diarrhea   3. Dehydration   4. Anemia   5. 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  Pt received call from her GI physician at [facility] that she was cdif positive, but she is not able to keep down po vancomycin that was called in for her.  Pt admitted for the above- her PMD is with [facility], but she requested not to be transferred to [facility]- so arrangements were made to transfer her to [facility], [facility] accepting.         Ethelda Chick, MD 01/27/11 [phone]

## 2011-01-27 NOTE — ED Notes (Signed)
Pt c/o n/v/d and pain in right side "on and off since April". Pt sts she received call today from MD at [facility] and told she has C-diff.

## 2011-01-27 NOTE — ED Notes (Signed)
Pt requesting meds for headache, pt reminded that she just rec'd dilaudid which was for pain and there were no other orders at this time.

## 2011-01-28 LAB — BASIC METABOLIC PANEL
BUN: 7 mg/dL (ref 6–23)
Calcium: 8.1 mg/dL — ABNORMAL LOW (ref 8.4–10.5)
Creatinine, Ser: 0.63 mg/dL (ref 0.50–1.10)
GFR calc non Af Amer: >90 mL/min (ref >90)
Glucose, Bld: 82 mg/dL (ref 70–99)

## 2011-01-28 LAB — CBC
HCT: 33.4 % — ABNORMAL LOW (ref 36.0–46.0)
Hemoglobin: 11.3 g/dL — ABNORMAL LOW (ref 12.0–15.0)
MCH: 34.8 pg — ABNORMAL HIGH (ref 26.0–34.0)
MCHC: 33.8 g/dL (ref 30.0–36.0)
MCV: 102.8 fL — ABNORMAL HIGH (ref 78.0–100.0)
RDW: 13.5 % (ref 11.5–15.5)

## 2011-01-28 LAB — TSH: TSH: 0.922 u[IU]/mL (ref 0.350–4.500)

## 2011-01-28 NOTE — H&P (Signed)
NAMEGAIA, Dawn Frost NO.:  [id number]  MEDICAL RECORD NO.:  [id number]  LOCATION:  1438                         FACILITY:  [facility]  PHYSICIAN:  Gery Pray, MD      DATE OF BIRTH:  [birth year]  DATE OF ADMISSION:  01/27/2011 DATE OF DISCHARGE:                             HISTORY & PHYSICAL   PRIMARY CARE PHYSICIAN:  Juan Quam, MD, [facility].  GASTROENTEROLOGIST:  Dr. Opal Sidles, at [facility].  Patient also sees Dr. Maren Beach at [facility].  ENDOCRINOLOGIST:  Dr. Allena Katz  CODE STATUS:  Full code.  Patient goes to team 3.  CHIEF COMPLAINT:  Nausea, vomiting, abdominal pain.  HISTORY OF PRESENT ILLNESS:  This is a 45 year old female with history of chronic diarrhea, due to some microscopic colitis.  She also has multiple chronic GI complaints, multiple admissions for GI issues. She states that since April of this year, she has been having recurrent nausea, vomiting, diarrhea, colitis, Crohn's disease, irritable bowel flare, and recurrent C. diff.  She stated that within the last 3 days,  her nausea, vomiting, and diarrhea have gotten significantly worse.  She reports no hematemesis, no melena.  She states she does not have fevers, but she gets chills in the nights and becomes diaphoretic with that.  She reports abdominal pain worse in the right and left lower quadrant.  She describes the pain as grabbing and sharp, 10/10 at its worst.  It radiates over to the left occasionally.  She states within the past 1 to 2 weeks  she has pain in the epigastric, central chest area that she describes as occasionally ripping, it does not occur frequently.  She states she has been following up with her doctors at [facility]; however, they have not suggested any change in medication regimen because all studies results are not in.  She states her last C. diff infection was approximately 1 to 2 months ago.  She was treated with vanco for approximately a week.  Her  last colonoscopy she states was by Dr. Opal Sidles at [facility] approximately 2 months ago.  She says the colonoscopy showed the inflammation.  She states her last EGD was in 2009.  She came to the ER because of her worsening diarrhea and her abdominal pain.  History obtained from the patient.  REVIEW OF SYSTEMS:  All 10-point systems reviewed and negative except as noted in HPI.  PAST MEDICAL HISTORY: 1. Includes pancreatitis. 2. 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 3. Crohn's disease. 4. Anxiety. 5. Depression. 6. History of gastric ulcer and gastritis. 7. History of kidney stones. 8. History of Wolff-Parkinson-White syndrome status post ablation. 9. Hypothyroidism. 10.Addison's disease. 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  PAST SURGICAL HISTORY:  Includes: 1. Cholecystectomy. 2. Ablation. 3. Lithotripsy. 4. Sphincterotomy.  MEDICATIONS: 1. Hydrocortisone 10 mg b.i.d. 2. Levoxyl, dose unknown. 3. Zoloft 100 mg daily. 4. Asacol 400 mg p.o. b.i.d. 5. Protonix. 6. Multivitamin tablets. 7. Klonopin 1 mg q.8 hours p.r.n. anxiety.  ALLERGIES:  Patient states she is truly allergic to none, but AMBIEN and ZOLOFT cause her to get a headache and MORPHINE causes her to become quite angry.  SOCIAL HISTORY:  Positive for tobacco.  Negative  for alcohol and illicit drugs.  She is married.  She is on a home oxygen.  FAMILY HISTORY:  Includes GI issues and dyslipidemia.  PHYSICAL EXAMINATION:  GENERAL:  Alert and oriented female. VITAL SIGNS:  AVSS. EYES:  Pink conjunctiva,  PERRLA.  No scleral icterus. ENT:  Moist oral mucosa.  Trachea midline. NECK:  Supple. LUNGS:  Clear to auscultation bilaterally.  No wheeze.  No use of accessory muscles.   CARDIOVASCULAR:  Regular rate and rhythm without murmurs, rigors, or gallops.  No JVD. ABDOMEN:  Positive bowel sounds, nontender, nondistended, soft. Generalized tenderness to palpation, nonspecific, not acute  surgical abdomen. NEURO:  Cranial nerves II through XII are grossly intact.  Sensation intact. MUSCULOSKELETAL:  Strength 5/5 in all extremities.  No clubbing, cyanosis, or edema.   SKIN:  No rashes.  No subcutaneous crepitation.  LABS:  CT abdomen and pelvis shows some mild diffuse inflammatory changes involving the colon, scattered fluid.  White blood count 11.3, hemoglobin 17.1, platelets 243.  Patient was not in the ER at [facility] on October 21st.  At that point, her white blood count was 17.4, okay today.  Patient's sodium is 141, potassium 3.8, chloride 109, CO2 24, glucose 108, BUN 10, creatinine 0.5, total bili is less than 0.1.  LFTs are normal.  UA has many bacteria, but only 0 to 2 white blood cells.  Urine appearance is cloudy.  ASSESSMENT AND PLAN: 1. Diarrhea. 2. Nausea and vomiting. 3. Question history of Crohn's disease.  Patient has been admitted.     She went to [facility] and was transferred to [facility].  I will     go ahead and order C. diff toxins and give patient some empiric     Flagyl.  I will order Phenergan p.r.n. nausea and vomiting.  I will     give her some Dilaudid p.r.n. pain.  I will increase patient's     Asacol to 800 mg p.o. t.i.d. and give her some IV Solu-Medrol. Hold     patient's p.o. hydrocortisone.  We will place the patient on a     clear liquid diet.  Check ova and parasites, and fecal leukocytes.     Continue patient's Protonix.  Patient's pain expression does seem     out of proportion to findings. 4. Hypokalemia.  This will be repleted on IV fluids.  We will also     check a magnesium level in the morning. 5. Hypothyroidism. 6. Addison's disease. 7. Anxiety and depression.  Resume home medication. 8. Tobacco use.  Nicotine patch will be ordered and tobacco cessation     counseling will be ordered.          ______________________________ Gery Pray, MD     DC/MEDQ  D:  01/27/2011  T:  01/28/2011  Job:   [id number]  Electronically Signed by Gery Pray MD on 01/28/2011 05:57:10 AM

## 2011-01-29 LAB — BASIC METABOLIC PANEL
BUN: 5 mg/dL — ABNORMAL LOW (ref 6–23)
CO2: 19 mEq/L (ref 19–32)
Chloride: 107 mEq/L (ref 96–112)
Creatinine, Ser: 0.56 mg/dL (ref 0.50–1.10)
Glucose, Bld: 118 mg/dL — ABNORMAL HIGH (ref 70–99)

## 2011-01-29 LAB — MAGNESIUM: Magnesium: 2.1 mg/dL (ref 1.5–2.5)

## 2011-01-29 LAB — FECAL LACTOFERRIN, QUANT: Fecal Lactoferrin: POSITIVE

## 2011-01-30 ENCOUNTER — Inpatient Hospital Stay (HOSPITAL_COMMUNITY): Payer: 59

## 2011-01-30 LAB — CBC
Hemoglobin: 11.9 g/dL — ABNORMAL LOW (ref 12.0–15.0)
MCHC: 31.8 g/dL (ref 30.0–36.0)
RDW: 13.5 % (ref 11.5–15.5)
WBC: 8.8 10*3/uL (ref 4.0–10.5)

## 2011-01-30 LAB — BASIC METABOLIC PANEL
BUN: <3 mg/dL — ABNORMAL LOW (ref 6–23)
Calcium: 8.4 mg/dL (ref 8.4–10.5)
Creatinine, Ser: 0.56 mg/dL (ref 0.50–1.10)
GFR calc Af Amer: >90 mL/min (ref >90)
GFR calc non Af Amer: >90 mL/min (ref >90)

## 2011-01-30 MED ORDER — IOHEXOL 300 MG/ML  SOLN
100.0000 mL | Freq: Once | INTRAMUSCULAR | Status: AC | PRN
  Administered 2011-01-30: 100 mL via INTRAVENOUS

## 2011-01-31 ENCOUNTER — Inpatient Hospital Stay (HOSPITAL_COMMUNITY): Payer: 59

## 2011-01-31 DIAGNOSIS — R197 Diarrhea, unspecified: Secondary | ICD-10-CM

## 2011-01-31 LAB — BASIC METABOLIC PANEL
CO2: 27 mEq/L (ref 19–32)
Calcium: 8.1 mg/dL — ABNORMAL LOW (ref 8.4–10.5)
Creatinine, Ser: 0.59 mg/dL (ref 0.50–1.10)
GFR calc Af Amer: >90 mL/min (ref >90)

## 2011-01-31 LAB — STOOL CULTURE

## 2011-01-31 LAB — SEDIMENTATION RATE: Sed Rate: 5 mm/hr (ref 0–22)

## 2011-01-31 LAB — OVA AND PARASITE EXAMINATION: Ova and parasites: NONE SEEN

## 2011-01-31 LAB — MAGNESIUM: Magnesium: 1.7 mg/dL (ref 1.5–2.5)

## 2011-02-01 ENCOUNTER — Other Ambulatory Visit: Payer: Self-pay | Admitting: Gastroenterology

## 2011-02-01 LAB — C-REACTIVE PROTEIN: CRP: 0.05 mg/dL — ABNORMAL LOW (ref <0.60)

## 2011-02-02 DIAGNOSIS — R197 Diarrhea, unspecified: Secondary | ICD-10-CM

## 2011-02-02 LAB — BASIC METABOLIC PANEL
CO2: 29 mEq/L (ref 19–32)
Calcium: 9.2 mg/dL (ref 8.4–10.5)
Sodium: 139 mEq/L (ref 135–145)

## 2011-02-02 LAB — MAGNESIUM: Magnesium: 1.9 mg/dL (ref 1.5–2.5)

## 2011-02-03 LAB — BASIC METABOLIC PANEL
CO2: 25 mEq/L (ref 19–32)
Calcium: 8.8 mg/dL (ref 8.4–10.5)
GFR calc Af Amer: >90 mL/min (ref >90)
GFR calc non Af Amer: >90 mL/min (ref >90)
Sodium: 137 mEq/L (ref 135–145)

## 2011-02-04 LAB — CBC
HCT: 35 % — ABNORMAL LOW (ref 36.0–46.0)
MCHC: 33.7 g/dL (ref 30.0–36.0)
MCV: 104.2 fL — ABNORMAL HIGH (ref 78.0–100.0)
RDW: 13.3 % (ref 11.5–15.5)

## 2011-02-04 LAB — BASIC METABOLIC PANEL
BUN: 6 mg/dL (ref 6–23)
Creatinine, Ser: 0.59 mg/dL (ref 0.50–1.10)
GFR calc Af Amer: >90 mL/min (ref >90)
GFR calc non Af Amer: >90 mL/min (ref >90)
Potassium: 4 mEq/L (ref 3.5–5.1)

## 2011-02-04 NOTE — Progress Notes (Signed)
  Dawn Frost, RABEN              ACCOUNT NO.:  [id number]  MEDICAL RECORD NO.:  [id number]  LOCATION:  1438                         FACILITY:  [facility]  PHYSICIAN:  Richarda Overlie, MD       DATE OF BIRTH:  12-16-1965                                PROGRESS NOTE   CURRENT DIAGNOSES: 1. Ongoing profuse diarrhea. 2. Pain out of proportion to physical findings. 3. Profound anxiety. 4. Drug feeding behavior with recurrent request for IV Dilaudid     without any CT findings. 5. History of Crohn disease. 6. History of microscopic colitis. 7. Anxiety and depression. 8. History of gastric ulcer and gastritis. 9. History of Wolff-Parkinson-White syndrome, status post ablation. 10.Hypothyroidism. 11.History of Addison disease. 12.Chronic back pain. 13.Cholecystectomy, ablation, lithotripsy, sphincterotomy.  CONSULTATIONS: Obtained [facility] for chronic diarrhea.  SUBJECTIVE: This is a 45 year old female, patient of Dr. Opal Sidles at [facility], also seen by Dr. Maren Beach at [facility] who presented to the ER with a chief complaint of chronic diarrhea.  The patient had multiple admissions for GI issues previously and she has been diagnosed with Crohn colitis, microscopic colitis, irritable bowel syndrome, and recurrent C. diff.  She presented with 3-day history of nausea, vomiting, diarrhea, getting slowly worse.  She states she was recently told in [facility] that she has C. diff diarrhea.  She was empirically started on Flagyl and was continued on; was also empirically started on IV Solu-Medrol and continued on Asacol.  C. diff toxin repeated here was negative x2.  The patient has stool studies done that showed fecal lactoferrin that was positive.  The stool culture did not show any Salmonella, Shigella, Campylobacter, and Yersinia.  C. diff PCR was negative and C. diff toxin was also negative.  The patient's vancomycin and Flagyl have been discontinued and she has been  scheduled for a flexible sigmoidoscopy today.  Her Asacol has been increased and hydrocortisone has been restarted.  The patient's CRP and ESR are within normal limits.  The patient reports having anywhere from 10 to 15 bowel movements on an ongoing basis, and has required repletion of her potassium and her magnesium during this hospitalization.  PHYSICAL EXAMINATION: VITAL SIGNS:  Blood pressure 134/86, respirations 18, pulse 62, temperature 98.3, 94% on room air. GENERAL:  Currently comfortable, in no acute cardiopulmonary distress. HEENT:  Pupils equal and reactive.  Extraocular movements are intact. NECK:  Supple.  No JVD. LUNGS:  Clear to auscultation bilaterally.  No wheezes, crackles, or rhonchi. CARDIOVASCULAR:  Regular rate and rhythm.  No murmurs, rubs, or gallops. ABDOMEN:  Soft, nontender, nondistended. EXTREMITIES:  Without cyanosis, clubbing, or edema. NEUROLOGIC:  Cranial nerves 2-12 grossly intact.     Richarda Overlie, MD     NA/MEDQ  D:  02/01/2011  T:  02/01/2011  Job:  [id number]  Electronically Signed by Richarda Overlie MD on 02/04/2011 10:45:47 AM

## 2011-02-05 DIAGNOSIS — K5289 Other specified noninfective gastroenteritis and colitis: Secondary | ICD-10-CM

## 2011-02-05 MED ORDER — HYDROMORPHONE HCL PF 2 MG/ML IJ SOLN
2.0000 mg | INTRAMUSCULAR | Status: DC | PRN
  Administered 2011-02-05 – 2011-02-06 (×4): 2 mg via INTRAVENOUS
  Administered 2011-02-06: 14:00:00 via INTRAVENOUS
  Administered 2011-02-06 – 2011-02-11 (×28): 2 mg via INTRAVENOUS
  Filled 2011-02-05 (×6): qty 1

## 2011-02-05 MED ORDER — SODIUM CHLORIDE 0.9 % IV SOLN
INTRAVENOUS | Status: DC
  Administered 2011-02-05 – 2011-02-06 (×2): via INTRAVENOUS
  Filled 2011-02-05 (×4): qty 1000

## 2011-02-05 MED ORDER — PROMETHAZINE HCL 25 MG PO TABS
12.5000 mg | ORAL_TABLET | Freq: Four times a day (QID) | ORAL | Status: DC | PRN
  Administered 2011-02-06 – 2011-02-11 (×7): 12.5 mg via ORAL
  Filled 2011-02-05 (×6): qty 1

## 2011-02-05 MED ORDER — POTASSIUM CHLORIDE CRYS ER 20 MEQ PO TBCR
40.0000 meq | EXTENDED_RELEASE_TABLET | Freq: Two times a day (BID) | ORAL | Status: DC
  Filled 2011-02-05 (×4): qty 2

## 2011-02-05 MED ORDER — SERTRALINE HCL 100 MG PO TABS
100.0000 mg | ORAL_TABLET | Freq: Every day | ORAL | Status: DC
  Administered 2011-02-06 – 2011-02-11 (×6): 100 mg via ORAL
  Filled 2011-02-05 (×7): qty 1

## 2011-02-05 MED ORDER — IPRATROPIUM BROMIDE 0.02 % IN SOLN: 0.5000 mg | RESPIRATORY_TRACT | Status: DC | PRN

## 2011-02-05 MED ORDER — OXYCODONE HCL 10 MG PO TB12
10.0000 mg | ORAL_TABLET | Freq: Two times a day (BID) | ORAL | Status: DC
  Administered 2011-02-06: 10 mg via ORAL
  Administered 2011-02-06: 14:00:00 via ORAL
  Administered 2011-02-07 – 2011-02-11 (×9): 10 mg via ORAL
  Filled 2011-02-05 (×9): qty 1

## 2011-02-05 MED ORDER — OXYCODONE HCL 5 MG PO TABS
5.0000 mg | ORAL_TABLET | ORAL | Status: DC | PRN
  Administered 2011-02-06 – 2011-02-11 (×11): 5 mg via ORAL
  Filled 2011-02-05 (×3): qty 1

## 2011-02-05 MED ORDER — LEVOTHYROXINE SODIUM 50 MCG PO TABS
50.0000 ug | ORAL_TABLET | Freq: Every day | ORAL | Status: DC
  Administered 2011-02-06 – 2011-02-11 (×6): 50 ug via ORAL
  Filled 2011-02-05 (×6): qty 1

## 2011-02-05 MED ORDER — DIPHENOXYLATE-ATROPINE 2.5-0.025 MG PO TABS
1.0000 | ORAL_TABLET | Freq: Two times a day (BID) | ORAL | Status: DC
  Administered 2011-02-05 – 2011-02-11 (×11): 1 via ORAL
  Filled 2011-02-05 (×3): qty 1

## 2011-02-05 MED ORDER — POTASSIUM CHLORIDE 20 MEQ/15ML (10%) PO LIQD
40.0000 meq | Freq: Two times a day (BID) | ORAL | Status: DC
  Administered 2011-02-05 – 2011-02-06 (×3): 40 meq via ORAL
  Filled 2011-02-05 (×5): qty 30

## 2011-02-05 MED ORDER — MESALAMINE 400 MG PO TBEC
1600.0000 mg | DELAYED_RELEASE_TABLET | Freq: Three times a day (TID) | ORAL | Status: DC
  Administered 2011-02-05 – 2011-02-11 (×17): 1600 mg via ORAL
  Filled 2011-02-05 (×21): qty 4

## 2011-02-05 MED ORDER — ALBUTEROL SULFATE (5 MG/ML) 0.5% IN NEBU
2.5000 mg | INHALATION_SOLUTION | RESPIRATORY_TRACT | Status: DC | PRN
  Filled 2011-02-05: qty 20

## 2011-02-05 MED ORDER — ENOXAPARIN SODIUM 40 MG/0.4ML ~~LOC~~ SOLN
40.0000 mg | SUBCUTANEOUS | Status: DC
  Administered 2011-02-06 – 2011-02-11 (×6): 40 mg via SUBCUTANEOUS
  Filled 2011-02-05 (×6): qty 0.4

## 2011-02-05 MED ORDER — CLONAZEPAM 1 MG PO TABS
1.0000 mg | ORAL_TABLET | Freq: Three times a day (TID) | ORAL | Status: DC | PRN
  Administered 2011-02-08: 1 mg via ORAL

## 2011-02-05 MED ORDER — PREDNISONE 20 MG PO TABS
40.0000 mg | ORAL_TABLET | Freq: Every day | ORAL | Status: DC
  Administered 2011-02-06 – 2011-02-11 (×6): 40 mg via ORAL
  Filled 2011-02-05 (×7): qty 2

## 2011-02-05 MED ORDER — ACETAMINOPHEN 325 MG PO TABS
650.0000 mg | ORAL_TABLET | ORAL | Status: DC | PRN
  Administered 2011-02-06 – 2011-02-10 (×2): 650 mg via ORAL

## 2011-02-05 MED ORDER — FLORA-Q PO CAPS
1.0000 | ORAL_CAPSULE | Freq: Every day | ORAL | Status: DC
  Administered 2011-02-06 – 2011-02-11 (×6): 1 via ORAL
  Filled 2011-02-05 (×7): qty 1

## 2011-02-05 MED ORDER — NICOTINE 14 MG/24HR TD PT24
14.0000 mg | MEDICATED_PATCH | Freq: Every day | TRANSDERMAL | Status: DC
  Administered 2011-02-06 – 2011-02-11 (×6): 14 mg via TRANSDERMAL
  Filled 2011-02-05 (×7): qty 1

## 2011-02-05 MED ORDER — ONDANSETRON HCL 4 MG/2ML IJ SOLN
4.0000 mg | INTRAMUSCULAR | Status: DC | PRN
  Administered 2011-02-06 – 2011-02-10 (×15): 4 mg via INTRAVENOUS
  Filled 2011-02-05 (×5): qty 2

## 2011-02-05 MED ORDER — HYOSCYAMINE SULFATE 0.125 MG PO TABS
0.3750 mg | ORAL_TABLET | Freq: Every day | ORAL | Status: DC
  Administered 2011-02-06 – 2011-02-11 (×6): 0.375 mg via ORAL
  Filled 2011-02-05 (×7): qty 3

## 2011-02-06 DIAGNOSIS — K5289 Other specified noninfective gastroenteritis and colitis: Secondary | ICD-10-CM

## 2011-02-06 DIAGNOSIS — R197 Diarrhea, unspecified: Secondary | ICD-10-CM | POA: Diagnosis present

## 2011-02-06 DIAGNOSIS — E039 Hypothyroidism, unspecified: Secondary | ICD-10-CM | POA: Diagnosis present

## 2011-02-06 DIAGNOSIS — E2740 Unspecified adrenocortical insufficiency: Secondary | ICD-10-CM | POA: Diagnosis present

## 2011-02-06 DIAGNOSIS — K219 Gastro-esophageal reflux disease without esophagitis: Secondary | ICD-10-CM

## 2011-02-06 DIAGNOSIS — K52832 Lymphocytic colitis: Secondary | ICD-10-CM | POA: Diagnosis present

## 2011-02-06 LAB — BASIC METABOLIC PANEL
BUN: 9 mg/dL (ref 6–23)
Calcium: 9 mg/dL (ref 8.4–10.5)
Creatinine, Ser: 0.59 mg/dL (ref 0.50–1.10)
GFR calc non Af Amer: >90 mL/min (ref >90)
Glucose, Bld: 81 mg/dL (ref 70–99)

## 2011-02-06 MED ORDER — HYDROMORPHONE HCL PF 2 MG/ML IJ SOLN
INTRAMUSCULAR | Status: AC
  Filled 2011-02-06: qty 1

## 2011-02-06 MED ORDER — HYDROMORPHONE HCL PF 2 MG/ML IJ SOLN
INTRAMUSCULAR | Status: AC
  Administered 2011-02-06: 2 mg via INTRAVENOUS
  Filled 2011-02-06: qty 1

## 2011-02-06 MED ORDER — HYDROMORPHONE HCL PF 2 MG/ML IJ SOLN
INTRAMUSCULAR | Status: AC
  Administered 2011-02-06 (×2): 2 mg via INTRAVENOUS
  Filled 2011-02-06: qty 1

## 2011-02-06 MED ORDER — HYDROMORPHONE HCL PF 2 MG/ML IJ SOLN
INTRAMUSCULAR | Status: AC
  Administered 2011-02-07: 2 mg via INTRAVENOUS
  Filled 2011-02-06: qty 1

## 2011-02-06 MED ORDER — ACETAMINOPHEN 325 MG PO TABS
ORAL_TABLET | ORAL | Status: AC
  Administered 2011-02-06: 650 mg via ORAL
  Filled 2011-02-06: qty 2

## 2011-02-06 MED ORDER — HYDROMORPHONE HCL PF 2 MG/ML IJ SOLN
INTRAMUSCULAR | Status: AC
  Administered 2011-02-06: 09:00:00
  Filled 2011-02-06: qty 1

## 2011-02-06 MED ORDER — DIPHENOXYLATE-ATROPINE 2.5-0.025 MG PO TABS
ORAL_TABLET | ORAL | Status: AC
  Administered 2011-02-06: 09:00:00
  Filled 2011-02-06: qty 1

## 2011-02-06 MED ORDER — PROMETHAZINE HCL 25 MG/ML IJ SOLN
INTRAMUSCULAR | Status: AC
  Administered 2011-02-06: 12.5 mg via INTRAVENOUS
  Filled 2011-02-06: qty 1

## 2011-02-06 MED ORDER — PROMETHAZINE HCL 25 MG PO TABS
ORAL_TABLET | ORAL | Status: AC
  Filled 2011-02-06: qty 1

## 2011-02-06 MED ORDER — PROMETHAZINE HCL 25 MG/ML IJ SOLN
INTRAMUSCULAR | Status: AC
  Administered 2011-02-06: 25 mg
  Filled 2011-02-06: qty 1

## 2011-02-06 MED ORDER — ONDANSETRON HCL 4 MG/2ML IJ SOLN
INTRAMUSCULAR | Status: AC
  Administered 2011-02-06: 4 mg via INTRAVENOUS
  Filled 2011-02-06: qty 2

## 2011-02-06 MED ORDER — OXYCODONE HCL 5 MG PO TABS
ORAL_TABLET | ORAL | Status: AC
  Administered 2011-02-06: 5 mg via ORAL
  Filled 2011-02-06: qty 1

## 2011-02-06 MED ORDER — GI COCKTAIL ~~LOC~~
30.0000 mL | ORAL | Status: DC | PRN
  Administered 2011-02-07 – 2011-02-11 (×10): 30 mL via ORAL
  Filled 2011-02-06 (×11): qty 30

## 2011-02-06 MED ORDER — PROMETHAZINE HCL 25 MG/ML IJ SOLN
INTRAMUSCULAR | Status: AC
  Filled 2011-02-06: qty 1

## 2011-02-06 MED ORDER — PROMETHAZINE HCL 25 MG/ML IJ SOLN
INTRAMUSCULAR | Status: AC
  Administered 2011-02-06: 12.5 mg
  Filled 2011-02-06: qty 1

## 2011-02-06 MED ORDER — ONDANSETRON HCL 4 MG/2ML IJ SOLN
INTRAMUSCULAR | Status: AC
  Administered 2011-02-07: 4 mg via INTRAVENOUS
  Filled 2011-02-06: qty 2

## 2011-02-06 MED ORDER — OXYCODONE HCL 10 MG PO TB12
ORAL_TABLET | ORAL | Status: AC
  Filled 2011-02-06: qty 1

## 2011-02-06 MED ORDER — POTASSIUM CHLORIDE IN NACL 20-0.9 MEQ/L-% IV SOLN
INTRAVENOUS | Status: DC
  Administered 2011-02-06 – 2011-02-10 (×7): via INTRAVENOUS
  Filled 2011-02-06 (×11): qty 1000

## 2011-02-06 MED ORDER — PANTOPRAZOLE SODIUM 40 MG IV SOLR
40.0000 mg | INTRAVENOUS | Status: DC
  Administered 2011-02-06: 40 mg via INTRAVENOUS
  Filled 2011-02-06 (×2): qty 40

## 2011-02-06 MED ORDER — DIPHENOXYLATE-ATROPINE 2.5-0.025 MG PO TABS
ORAL_TABLET | ORAL | Status: AC
  Administered 2011-02-07: 1 via ORAL
  Filled 2011-02-06: qty 1

## 2011-02-06 NOTE — Progress Notes (Signed)
300 intake

## 2011-02-06 NOTE — Progress Notes (Signed)
Subjective: Since I last evaluated the patient she had had some improvement in her diarrhea,still having 15-20 stools/day but very small volume squirts. She had active reflux symptoms with N/V last night. Still c/o burning in chest, and epigastrium.  Objective: Vital signs in last 24 hours: Temp:  [97.6 F (36.4 C)-98 F (36.7 C)] 97.6 F (36.4 C) (11/04 0540) Pulse Rate:  [57-89] 57  (11/04 0540) Resp:  [14-20] 18  (11/04 0540) BP: (115-129)/(74-86) 129/79 mmHg (11/04 0540) SpO2:  [96 %-98 %] 96 % (11/04 0540) Weight:  [58.2 kg (128 lb 4.9 oz)] 128 lb 4.9 oz (58.2 kg) (11/04 0540)    Intake/Output from previous day: 11/03 0701 - 11/04 0700 In: 2376.3 [P.O.:220; I.V.:2156.3] Out: 651 [Urine:650; Stool:1] Intake/Output this shift:    EXAM; CV;rrr with s1s2 Pulm ;cllear, Abd; soft, no focal tenderness,   Lab Results:  Basename 02/04/11 0530  WBC 10.4  HGB 11.8*  HCT 35.0*  PLT 227   BMET  Basename 02/06/11 0515 02/04/11 0530  NA 137 136  K 4.2 4.0  CL 104 103  CO2 26 27  GLUCOSE 81 93  BUN 9 6  CREATININE 0.59 0.59  CALCIUM 9.0 9.0   LFT No results found for this basename: PROT,ALBUMIN,AST,ALT,ALKPHOS,BILITOT,BILIDIR,IBILI in the last 72 hours PT/INR No results found for this basename: LABPROT:2,INR:2 in the last 72 hours Hepatitis Panel No results found for this basename: HEPBSAG,HCVAB,HEPAIGM,HEPBIGM in the last 72 hours C-Diff No results found for this basename: CDIFFTOX:3 in the last 72 hours Fecal Lactopherrin No results found for this basename: FECLLACTOFRN in the last 72 hours  Studies/Results: No results found.    Assessment/Plan: # 1  Lymphocytic colitis ; continue prednisone  40 mg daily., and lomotil twice daily   #2 new c/o chest pain, heartburn-suspect steroid induced esophagitis -will add bid protonix, and try GI cocktail q 4 hours as needed-if no better in 24-48 hours will do EGD .   Jhonny Calixto 02/06/2011, 9:31 AM

## 2011-02-06 NOTE — Progress Notes (Signed)
I have reviewed and agree with current management plan.  Venita Lick. Russella Dar MD [facility] 02/06/2011, 10:09 AM

## 2011-02-06 NOTE — Progress Notes (Signed)
Subjective: Pt c/o of pain going from her upper abd. up to her throat, +N/V, still with small multiple diarrheal stools.  Objective: Vital signs in last 24 hours: Temp:  [97.6 F (36.4 C)-98 F (36.7 C)] 98 F (36.7 C) (11/04 2219) Pulse Rate:  [57-79] 67  (11/04 2219) Resp:  [18-20] 18  (11/04 2219) BP: (111-144)/(70-94) 111/70 mmHg (11/04 2219) SpO2:  [96 %-98 %] 96 % (11/04 2219) Weight:  [58.2 kg (128 lb 4.9 oz)] 128 lb 4.9 oz (58.2 kg) (11/04 0540) Last BM Date: 02/06/11 Intake/Output from previous day: 11/03 0701 - 11/04 0700 In: 2376.3 [P.O.:220; I.V.:2156.3] Out: 651 [Urine:650; Stool:1] Intake/Output this shift:      General Appearance:    Alert, cooperative, no distress,  Lungs:     Clear to auscultation bilaterally, respirations unlabored   Heart:    Regular rate and rhythm, S1 and S2 normal, no murmur, rub   or gallop  Abdomen:     Soft, mild upper abd tenderness, bowel sounds active all four quadrants,    no masses, no organomegaly  Extremities:   Extremities normal, atraumatic, no cyanosis or edema       Weight change:   Intake/Output Summary (Last 24 hours) at 02/06/11 2333 Last data filed at 02/06/11 1900  Gross per 24 hour  Intake 656.25 ml  Output    851 ml  Net -194.75 ml    Lab Results:   Basename 02/06/11 0515 02/04/11 0530  NA 137 136  K 4.2 4.0  CL 104 103  CO2 26 27  GLUCOSE 81 93  BUN 9 6  CREATININE 0.59 0.59  CALCIUM 9.0 9.0    Basename 02/04/11 0530  WBC 10.4  HGB 11.8*  HCT 35.0*  PLT 227  MCV 104.2*    Recent Results (from the past 240 hour(s))  OVA AND PARASITE EXAMINATION     Status: Normal   Collection Time   01/28/11  3:54 AM      Component Value Range Status Comment   Specimen Description STOOL   Final    Special Requests NONE   Final    Ova and parasites NO OVA OR PARASITES SEEN   Final    Report Status 01/31/2011 FINAL   Final   CLOSTRIDIUM DIFFICILE BY PCR     Status: Normal   Collection Time   01/30/11   9:51 PM      Component Value Range Status Comment   C difficile by pcr NEGATIVE  NEGATIVE  Final    Studies/Results:  Scheduled Meds:   . diphenoxylate-atropine  1 tablet Oral BID  . diphenoxylate-atropine      . diphenoxylate-atropine      . enoxaparin (LOVENOX) injection  40 mg Subcutaneous Q24H  . Flora-Q  1 capsule Oral Daily  . HYDROmorphone      . HYDROmorphone      . HYDROmorphone      . hyoscyamine  0.375 mg Oral Daily  . levothyroxine  50 mcg Oral QAC breakfast  . mesalamine  1,600 mg Oral TID  . nicotine  14 mg Transdermal Daily  . ondansetron      . oxyCODONE      . oxyCODONE  10 mg Oral Q12H  . pantoprazole (PROTONIX) IV  40 mg Intravenous Q24H  . potassium chloride  40 mEq Oral BID  . predniSONE  40 mg Oral Daily  . promethazine      . promethazine      . promethazine      .  promethazine      . promethazine      . sertraline  100 mg Oral Daily   Continuous Infusions:   . 0.9 % NaCl with KCl 20 mEq / L 75 mL/hr at 02/06/11 1130  . DISCONTD: sodium chloride 0.9 % 1,000 mL with potassium chloride 20 mEq infusion 75 mL/hr at 02/06/11 0445   PRN Meds:.acetaminophen, albuterol, clonazePAM, gi cocktail, HYDROmorphone (DILAUDID) injection, ipratropium, ondansetron, oxyCODONE, promethazine Assessment/Plan:  1.Lymphocytic colitis- appreciate GI assistance, pt on steroid, mesalamine and antidiarrheal, and narcotics for pain management  2.Diarrhea- secondary to #1, as above  3.Adrenal insufficiency - continue steroids 4.Hypothyroid - on synthroid  5.Gerd - worsened by steroids, continue PPI, GI cocktail added. GI planning EGD if symptoms do not improve. 6. Anxiety/Depression - continue zoloft, and clonazepam prn.  LOS: 10 days   Dawn Frost C 02/06/2011, 11:33 PM

## 2011-02-07 LAB — BASIC METABOLIC PANEL
Calcium: 8.8 mg/dL (ref 8.4–10.5)
GFR calc Af Amer: >90 mL/min (ref >90)
GFR calc non Af Amer: >90 mL/min (ref >90)
Glucose, Bld: 74 mg/dL (ref 70–99)
Sodium: 140 mEq/L (ref 135–145)

## 2011-02-07 LAB — CBC
Hemoglobin: 11.7 g/dL — ABNORMAL LOW (ref 12.0–15.0)
MCH: 34.3 pg — ABNORMAL HIGH (ref 26.0–34.0)
MCHC: 31.5 g/dL (ref 30.0–36.0)
Platelets: 210 10*3/uL (ref 150–400)
RDW: 13.4 % (ref 11.5–15.5)

## 2011-02-07 MED ORDER — OXYCODONE HCL 10 MG PO TB12
ORAL_TABLET | ORAL | Status: AC
  Administered 2011-02-07: 10 mg via ORAL
  Filled 2011-02-07: qty 1

## 2011-02-07 MED ORDER — ALBUTEROL SULFATE (5 MG/ML) 0.5% IN NEBU: 2.5000 mg | INHALATION_SOLUTION | RESPIRATORY_TRACT | Status: DC | PRN

## 2011-02-07 MED ORDER — POTASSIUM CHLORIDE CRYS ER 20 MEQ PO TBCR
40.0000 meq | EXTENDED_RELEASE_TABLET | Freq: Two times a day (BID) | ORAL | Status: DC
  Administered 2011-02-07 – 2011-02-11 (×9): 40 meq via ORAL
  Filled 2011-02-07 (×10): qty 2

## 2011-02-07 MED ORDER — PANTOPRAZOLE SODIUM 40 MG PO TBEC
40.0000 mg | DELAYED_RELEASE_TABLET | Freq: Two times a day (BID) | ORAL | Status: DC
  Administered 2011-02-07 – 2011-02-11 (×8): 40 mg via ORAL
  Filled 2011-02-07 (×8): qty 1

## 2011-02-07 NOTE — Progress Notes (Signed)
Subjective: Patient reports some decrease  in upper abdominal - to throat pain, has had a 5 small diarrheal stools  for today. Objective: Vital signs in last 24 hours: Temp:  [98 F (36.7 C)-98.2 F (36.8 C)] 98.2 F (36.8 C) (11/05 0510) Pulse Rate:  [56-70] 56  (11/05 0510) Resp:  [18] 18  (11/05 0510) BP: (111-144)/(70-94) 112/74 mmHg (11/05 0510) SpO2:  [96 %-98 %] 98 % (11/05 0510) Last BM Date: 02/06/11 Intake/Output from previous day: 11/04 0701 - 11/05 0700 In: 480 [P.O.:480] Out: 650 [Urine:650] Intake/Output this shift:      General Appearance:    Alert, cooperative, no distress, appears stated age  Lungs:     Clear to auscultation bilaterally, respirations unlabored   Heart:    Regular rate and rhythm, S1 and S2 normal, no murmur, rub   or gallop  Abdomen:     Decreased tenderness, bowel sounds active all four quadrants,    no masses, no organomegaly  Extremities:   Extremities normal, atraumatic, no cyanosis or edema  Neurologic:   CNII-XII intact, normal strength, sensation and reflexes    throughout    Weight change:   Intake/Output Summary (Last 24 hours) at 02/07/11 1207 Last data filed at 02/07/11 0513  Gross per 24 hour  Intake    480 ml  Output    650 ml  Net   -170 ml    Lab Results:   [facility] 02/07/11 0422 02/06/11 0515  NA 140 137  K 4.4 4.2  CL 105 104  CO2 30 26  GLUCOSE 74 81  BUN 17 9  CREATININE 0.79 0.59  CALCIUM 8.8 9.0    Basename 02/07/11 0422  WBC 9.3  HGB 11.7*  HCT 37.2  PLT 210  MCV 109.1*   Micro Results: Recent Results (from the past 240 hour(s))  CLOSTRIDIUM DIFFICILE BY PCR     Status: Normal   Collection Time   01/30/11  9:51 PM      Component Value Range Status Comment   C difficile by pcr NEGATIVE  NEGATIVE  Final    Studies/Results:  Scheduled Meds:   . diphenoxylate-atropine  1 tablet Oral BID  . enoxaparin (LOVENOX) injection  40 mg Subcutaneous Q24H  . Flora-Q  1 capsule Oral Daily  .  HYDROmorphone      . hyoscyamine  0.375 mg Oral Daily  . levothyroxine  50 mcg Oral QAC breakfast  . mesalamine  1,600 mg Oral TID  . nicotine  14 mg Transdermal Daily  . ondansetron      . oxyCODONE      . oxyCODONE  10 mg Oral Q12H  . pantoprazole  40 mg Oral BID AC  . predniSONE  40 mg Oral Daily  . promethazine      . promethazine      . promethazine      . promethazine      . sertraline  100 mg Oral Daily  . DISCONTD: pantoprazole (PROTONIX) IV  40 mg Intravenous Q24H  . DISCONTD: potassium chloride  40 mEq Oral BID   Continuous Infusions:   . 0.9 % NaCl with KCl 20 mEq / L 75 mL/hr at 02/06/11 1130   PRN Meds:.acetaminophen, albuterol, clonazePAM, gi cocktail, HYDROmorphone (DILAUDID) injection, ipratropium, ondansetron, oxyCODONE, promethazine Assessment/Plan:   1.Lymphocytic colitis-  Continue steroids, mesalamine and antidiarrheal, and narcotics for pain management  2.Diarrhea- secondary to #1, as above  3.Adrenal insufficiency - continue steroids 4.Hypothyroid - on synthroid  5.Genella Rife -  improving, agree with increasing PPI, continue GI cocktail .  6. Anxiety/Depression - continue zoloft, and clonazepam prn.   LOS: 11 days   Ruchel Brandenburger C 02/07/2011, 12:07 PM

## 2011-02-07 NOTE — Progress Notes (Signed)
Subjective: Since I last evaluated the patient yesterday she has had some improvement in her diarrhea with about 5 very small volume bowel movements over the past 12 hours. She admits that these are just little sports. She continues to complain of pain which has been an issue since prior to admission. Her esophageal complaints are improved as well. She continues to complain of burning in her epigastrium and her chest but is not nearly status severe as it was yesterday. She has been eating. She is afraid about this new pain. I explained that this is very very likely esophagitis due to medications and steroids and will continue to improve with. She asked about endoscopy and at this point I feel that continued medical management is appropriate.  Objective: Vital signs in last 24 hours: Temp:  [98 F (36.7 C)-98.2 F (36.8 C)] 98.2 F (36.8 C) (11/05 0510) Pulse Rate:  [56-70] 56  (11/05 0510) Resp:  [18] 18  (11/05 0510) BP: (111-144)/(70-94) 112/74 mmHg (11/05 0510) SpO2:  [96 %-98 %] 98 % (11/05 0510) Last BM Date: 02/06/11  Intake/Output from previous day: 11/04 0701 - 11/05 0700 In: 480 [P.O.:480] Out: 650 [Urine:650] Intake/Output this shift:    Well-developed young white female in no acute distress lying in bed, Cardiovascular regular rate and rhythm with S1-S2 no murmur or gallop, Pulmonary clear bilaterally, Abdomen soft minimally nonfocal he tender no guarding no rebound bowel sounds active  Lab Results:  Basename 02/07/11 0422  WBC 9.3  HGB 11.7*  HCT 37.2  PLT 210   BMET  Basename 02/07/11 0422 02/06/11 0515  NA 140 137  K 4.4 4.2  CL 105 104  CO2 30 26  GLUCOSE 74 81  BUN 17 9  CREATININE 0.79 0.59  CALCIUM 8.8 9.0   LFT  PT/INR Hepatitis Panel C-Diff  Fecal Lactopherrin  Studies/Results: No results found.  Medications: I have reviewed the patient's current medications.  Assessment/Plan: #1 Lymphocytic colitis, acute exacerbation improving. Plan  continue prednisone 40 mg by mouth daily with slow taper as an outpatient, continue mesalamine , and when necessary antidiarrheals  #2 acute esophagitis, likely steroid-induced. Improved over the past 24 hours though not resolved. Patient needs twice daily proton exit and we'll be sure she has GI cocktail available every 4 hours when necessary. I think she is nearing discharge and hopefully can be discharged home in the next 24-48 hours.  LOS: 11 days   Amy Esterwood 02/07/2011, 9:02 AM

## 2011-02-07 NOTE — Progress Notes (Signed)
Patient seen and I agree with the above documentation, including the assessment and plan.  --Overall improving.  Still with esophagitis symptoms, which we have discussed.  This should slowly improve and not be a chronic problem for her (she was under the impression this would now be a chronic problem).   --Awaiting celiac panel. Carie Caddy. Rushton Early, M.D.  02/07/2011

## 2011-02-08 DIAGNOSIS — R197 Diarrhea, unspecified: Secondary | ICD-10-CM

## 2011-02-08 DIAGNOSIS — K219 Gastro-esophageal reflux disease without esophagitis: Secondary | ICD-10-CM

## 2011-02-08 DIAGNOSIS — K5289 Other specified noninfective gastroenteritis and colitis: Secondary | ICD-10-CM

## 2011-02-08 LAB — GLIADIN ANTIBODIES, SERUM: Gliadin IgG: 2.5 U/mL (ref <20)

## 2011-02-08 MED ORDER — LORAZEPAM 0.5 MG PO TABS
0.5000 mg | ORAL_TABLET | Freq: Three times a day (TID) | ORAL | Status: DC | PRN
  Administered 2011-02-08 – 2011-02-10 (×5): 0.5 mg via ORAL
  Filled 2011-02-08 (×5): qty 1

## 2011-02-08 NOTE — Progress Notes (Signed)
Subjective: States she had a rough night- diarrhea worse, also n/v. Objective: Vital signs in last 24 hours: Temp:  [97.5 F (36.4 C)-97.8 F (36.6 C)] 97.8 F (36.6 C) (11/06 1330) Pulse Rate:  [59-92] 92  (11/06 1330) Resp:  [18] 18  (11/06 1330) BP: (105-130)/(65-90) 130/90 mmHg (11/06 1330) SpO2:  [96 %-98 %] 96 % (11/06 1330) Last BM Date: 02/08/11 Intake/Output from previous day: 11/05 0701 - 11/06 0700 In: 3360 [P.O.:600; I.V.:2760] Out: 1125 [Urine:450; Stool:675] Intake/Output this shift: Total I/O In: 360 [P.O.:360] Out: 401 [Urine:200; Stool:201]    General Appearance:    Alert, cooperative, no distress, appears stated age  Lungs:     Clear to auscultation bilaterally, respirations unlabored   Heart:    Regular rate and rhythm, S1 and S2 normal, no murmur, rub   or gallop  Abdomen:     Mild tenderness, soft, bowel sounds active all four quadrants,    no masses, no organomegaly  Extremities:   Extremities normal, atraumatic, no cyanosis or edema       Weight change:   Intake/Output Summary (Last 24 hours) at 02/08/11 1648 Last data filed at 02/08/11 1500  Gross per 24 hour  Intake   1905 ml  Output   1526 ml  Net    379 ml    Lab Results:   [facility] 02/07/11 0422 02/06/11 0515  NA 140 137  K 4.4 4.2  CL 105 104  CO2 30 26  GLUCOSE 74 81  BUN 17 9  CREATININE 0.79 0.59  CALCIUM 8.8 9.0    Basename 02/07/11 0422  WBC 9.3  HGB 11.7*  HCT 37.2  PLT 210  MCV 109.1*   Micro Results: Recent Results (from the past 240 hour(s))  CLOSTRIDIUM DIFFICILE BY PCR     Status: Normal   Collection Time   01/30/11  9:51 PM      Component Value Range Status Comment   C difficile by pcr NEGATIVE  NEGATIVE  Final    } Scheduled Meds:   . diphenoxylate-atropine  1 tablet Oral BID  . enoxaparin (LOVENOX) injection  40 mg Subcutaneous Q24H  . Flora-Q  1 capsule Oral Daily  . hyoscyamine  0.375 mg Oral Daily  . levothyroxine  50 mcg Oral QAC breakfast    . mesalamine  1,600 mg Oral TID  . nicotine  14 mg Transdermal Daily  . oxyCODONE  10 mg Oral Q12H  . pantoprazole  40 mg Oral BID AC  . potassium chloride  40 mEq Oral BID  . predniSONE  40 mg Oral Daily  . sertraline  100 mg Oral Daily   Continuous Infusions:   . 0.9 % NaCl with KCl 20 mEq / L 75 mL/hr at 02/08/11 1430   PRN Meds:.acetaminophen, albuterol, gi cocktail, HYDROmorphone (DILAUDID) injection, ipratropium, LORazepam, ondansetron, oxyCODONE, promethazine, DISCONTD: albuterol, DISCONTD: clonazePAM Assessment/Plan:  1.Lymphocytic colitis-appreciate GI assistance, Continue steroids, mesalamine and antidiarrheal, and narcotics for pain management  2.Diarrhea- worse last pm, agree with re-ordering c.diff, continue management of #1 as above   3.Adrenal insufficiency - continue steroids 4.Hypothyroid - on synthroid  5.Gerd/acute esophagitis -   PPI, continue GI cocktail, await further GI recs  6. Anxiety/Depression - continue zoloft, will change clonazepam to ativan prn as per pt's request.  Dawn Frost 02/08/2011, 4:48 PM

## 2011-02-08 NOTE — Progress Notes (Addendum)
  [facility] Progress Note  Subjective: Patient had a bad night last night with significant increase in her diarrhea and she says increased in volume of watery diarrhea as well. She is tearful the morning. She continues to complain of dysphagia and odynophagia which is relieved by the GI cocktail but symptoms overall are not improved. She has recorded bowel movements and had about 10 bowel movements since last evening.  Objective: Vital signs in last 24 hours: Temp:  [97.5 F (36.4 C)-97.7 F (36.5 C)] 97.5 F (36.4 C) (11/06 0508) Pulse Rate:  [59-87] 59  (11/06 0508) Resp:  [18] 18  (11/06 0508) BP: (105-126)/(65-85) 105/65 mmHg (11/06 0508) SpO2:  [93 %-98 %] 98 % (11/06 0508) Last BM Date: 02/08/11 General:   Alert,  Well-developed, well-nourished, pleasant and cooperative in NAD careful Head:  Normocephalic and atraumatic. Eyes:  Sclera clear, no icterus.   Conjunctiva pink. Mouth:  No deformity or lesions, dentition normal. Neck:  Supple; no masses or thyromegaly. Heart:  Regular rate and rhythm; no murmurs, clicks, rubs,  or gallops. Abdomen:  Soft, tender and nondistended. No masses, hepatosplenomegaly or hernias noted. Normal bowel sounds, without guarding, and without rebound.   Msk:  Symmetrical without gross deformities. Normal posture. Pulses:  Normal pulses noted. Extremities:  Without clubbing or edema. Neurologic:  Alert and  oriented x4;  grossly normal neurologically. Skin:  Intact without significant lesions or rashe. Psych:  Alert and cooperative. Normal mood and affect.  Intake/Output from previous day: 11/05 0701 - 11/06 0700 In: 3360 [P.O.:600; I.V.:2760] Out: 1125 [Urine:450; Stool:675] Intake/Output this shift:    Lab Results:  Basename 02/07/11 0422  WBC 9.3  HGB 11.7*  HCT 37.2  PLT 210   BMET  Basename 02/07/11 0422 02/06/11 0515  NA 140 137  K 4.4 4.2  CL 105 104  CO2 30 26  GLUCOSE 74 81  BUN 17 9  CREATININE 0.79 0.59    CALCIUM 8.8 9.0       Studies/Results: No results found.   Assessment / Plan:  #1 lymphocytic colitis, on prednisone 40 mg by mouth daily and now with increasing diarrhea again over the past 24 hours. Her C. difficile PCR had been negative however with continued hospitalization and steroid use we'll repeat stool for C. difficile PCR #2 Acute esophagitis, likely secondary to steroids. She is on twice daily proton length and every 4 hours GI cocktail. She may have Candida esophagitis on Monday and will consider adding empiric Diflucan 100 mg by mouth daily- I will discuss #3 adrenal insufficiency not again on chronic steroid therapy  , If patient does not improve and her C. difficile PCR is negative we may need to consider increasing her dose of steroids also she will she may need referral back to [facility] and will need regular followup with Dr. Opal Sidles her gastroenterologist in [place]. She may require another immunosuppressant in addition to steroids.  LOS: 12 days   Amy Esterwood  02/08/2011, 10:13 AM  Patient seen, examined and I agree with the above documentation, including the assessment and plan. Diarrhea seems a bit worse (subjectively).  Will repeat c diff testing. Lower abd pain is unclear to me at present.  It has been persistent and may not relate to GI tract. She has also been eval'ed by Maren Beach at [facility], GI.  I know Harriett Sine personally, and will contact her in an attempt to gather more information regarding workup done prior to admission here.

## 2011-02-08 NOTE — Progress Notes (Signed)
Lab refused C-diff sample stating that "only one C-diff PCR can be ordered every 30 days. Lab suggested that the MD ordering the test  call Dr. Cardell Peach at [phone] and discuss the matter with him. Mike Gip, PA  Notified.

## 2011-02-09 LAB — CBC
Hemoglobin: 10.9 g/dL — ABNORMAL LOW (ref 12.0–15.0)
MCH: 33.7 pg (ref 26.0–34.0)
MCHC: 31.3 g/dL (ref 30.0–36.0)
RDW: 13.2 % (ref 11.5–15.5)

## 2011-02-09 LAB — BASIC METABOLIC PANEL
BUN: 13 mg/dL (ref 6–23)
Calcium: 8.3 mg/dL — ABNORMAL LOW (ref 8.4–10.5)
GFR calc non Af Amer: >90 mL/min (ref >90)
Glucose, Bld: 79 mg/dL (ref 70–99)
Sodium: 137 mEq/L (ref 135–145)

## 2011-02-09 NOTE — Progress Notes (Signed)
  [facility] Progress Note  Subjective: Sokhna admits she is doing better as far as her swallowing is concerned and having less put on aphasia. She is able to eat a solid diet. She states that she is still having significant diarrhea and that her stools were larger volume and watery. Patient reports 8 episodes last night, however nursing notes have recorded 3 episodes of diarrhea. We have reorder the stool for C. difficile PCR now will be pending today.  Objective: Vital signs in last 24 hours: Temp:  [97.4 F (36.3 C)-98 F (36.7 C)] 98 F (36.7 C) (11/07 1610) Pulse Rate:  [64-92] 64  (11/07 0613) Resp:  [18] 18  (11/07 0613) BP: (117-130)/(76-90) 126/82 mmHg (11/07 0613) SpO2:  [95 %-98 %] 98 % (11/07 0613) Last BM Date: 02/08/11  No exam today discussion only  Intake/Output from previous day: 11/06 0701 - 11/07 0700 In: 3101.3 [P.O.:1320; I.V.:1781.3] Out: 2001 [Urine:1000; Stool:1001] Intake/Output this shift: Total I/O In: -  Out: 100 [Stool:100]  Lab Results:  [facility] 02/09/11 0530 02/07/11 0422  WBC 10.5 9.3  HGB 10.9* 11.7*  HCT 34.8* 37.2  PLT 206 210   BMET  Basename 02/09/11 0530 02/07/11 0422  NA 137 140  K 3.8 4.4  CL 105 105  CO2 28 30  GLUCOSE 79 74  BUN 13 17  CREATININE 0.67 0.79  CALCIUM 8.3* 8.8            Studies/Results: No results found.   Assessment / Plan: #5 45 year old female with a lymphocytic colitis, on prednisone 40 mg by mouth daily, H. call and antidiarrheals. Patient reports increase in stooling over the past 48 hours. We will repeat stool for C. difficile PCR to assure she does not have a call recurrent C. difficile colitis. Prior stool for C. difficile PCR on admission was negative however she has a stool for C. difficile toxin which was positive at [facility] on 01/27/2011. #2 acute esophagitis likely steroid induced, improved on PPI and when necessary GI cocktail will plan to continue. 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  Stool for C. difficile PCR is to be obtained today, if this is positive will treat accordingly if this is negative he'll she is stable for discharge to home, can increase her Lomotil, would discharge on prednisone 40 mg by mouth daily and have her followup with Dr. Opal Sidles her primary gastroenterologist in [place].     LOS: 13 days   Dom Haverland  02/09/2011, 9:17 AM

## 2011-02-09 NOTE — Progress Notes (Signed)
Subjective: Patient continues to have diarrhea. She says, that she's having more diarrhea today than she was having yesterday. She also has nausea, but no emesis, quite yet. She also has significant abdominal pain in the right lower quadrant. Denies any blood in the stool. She tells me that she was positive for C. difficile at [facility]. Upon reviewing the records available it appears that they did a C. Difficile toxin study and not PCR. So, I am unclear as to how reliable the positive test was. Apparently an order for vanc was sent to pharmacy. Unclear if patient received this or not.  Objective: Vital signs in last 24 hours: Temp:  [97.4 F (36.3 C)-98.1 F (36.7 C)] 98.1 F (36.7 C) (11/07 1422) Pulse Rate:  [64-80] 80  (11/07 1422) Resp:  [18] 18  (11/07 1422) BP: (117-126)/(76-83) 117/83 mmHg (11/07 1422) SpO2:  [95 %-98 %] 97 % (11/07 1422) Weight change:  Last BM Date: 02/09/11  Intake/Output from previous day: 11/06 0701 - 11/07 0700 In: 3101.3 [P.O.:1320; I.V.:1781.3] Out: 2001 [Urine:1000; Stool:1001] Intake/Output this shift: Total I/O In: 480 [P.O.:480] Out: 100 [Stool:100]  Physical Exam  Vitals reviewed. Constitutional: She is oriented to person, place, and time. She appears well-developed and well-nourished. No distress.  HENT:  Head: Normocephalic and atraumatic.  Mouth/Throat: Oropharynx is clear and moist. No oropharyngeal exudate.  Eyes: Conjunctivae and EOM are normal. No scleral icterus.  Neck: Normal range of motion. No tracheal deviation present. No thyromegaly present.  Cardiovascular: Normal rate and regular rhythm.  Exam reveals no friction rub.   No murmur heard. Pulmonary/Chest: Effort normal and breath sounds normal. No respiratory distress. She has no wheezes. She has no rales.  Abdominal: Soft. Bowel sounds are normal. There is tenderness in the right lower quadrant and epigastric area. There is no rebound, no guarding and negative Murphy's  sign.  Musculoskeletal: Normal range of motion. She exhibits no edema.  Neurological: She is alert and oriented to person, place, and time. No cranial nerve deficit.  Skin: Skin is warm and dry.  Psychiatric: She has a normal mood and affect.    Lab Results:  [facility] 02/09/11 0530 02/07/11 0422  WBC 10.5 9.3  HGB 10.9* 11.7*  HCT 34.8* 37.2  PLT 206 210   BMET  Basename 02/09/11 0530 02/07/11 0422  NA 137 140  K 3.8 4.4  CL 105 105  CO2 28 30  GLUCOSE 79 74  BUN 13 17  CREATININE 0.67 0.79  CALCIUM 8.3* 8.8    Results for orders placed during the hospital encounter of 01/27/11 (from the past 24 hour(s))  CBC     Status: Abnormal   Collection Time   02/09/11  5:30 AM      Component Value Range   WBC 10.5  4.0 - 10.5 (K/uL)   RBC 3.23 (*) 3.87 - 5.11 (MIL/uL)   Hemoglobin 10.9 (*) 12.0 - 15.0 (g/dL)   HCT 16.1 (*) 09.6 - 46.0 (%)   MCV 107.7 (*) 78.0 - 100.0 (fL)   MCH 33.7  26.0 - 34.0 (pg)   MCHC 31.3  30.0 - 36.0 (g/dL)   RDW 04.5  40.9 - 81.1 (%)   Platelets 206  150 - 400 (K/uL)  BASIC METABOLIC PANEL     Status: Abnormal   Collection Time   02/09/11  5:30 AM      Component Value Range   Sodium 137  135 - 145 (mEq/L)   Potassium 3.8  3.5 - 5.1 (  mEq/L)   Chloride 105  96 - 112 (mEq/L)   CO2 28  19 - 32 (mEq/L)   Glucose, Bld 79  70 - 99 (mg/dL)   BUN 13  6 - 23 (mg/dL)   Creatinine, Ser 1.61  0.50 - 1.10 (mg/dL)   Calcium 8.3 (*) 8.4 - 10.5 (mg/dL)   GFR calc non Af Amer >90  >90 (mL/min)   GFR calc Af Amer >90  >90 (mL/min)    Studies/Results: No results found.  Medications:  Scheduled:   . diphenoxylate-atropine  1 tablet Oral BID  . enoxaparin (LOVENOX) injection  40 mg Subcutaneous Q24H  . Flora-Q  1 capsule Oral Daily  . hyoscyamine  0.375 mg Oral Daily  . levothyroxine  50 mcg Oral QAC breakfast  . mesalamine  1,600 mg Oral TID  . nicotine  14 mg Transdermal Daily  . oxyCODONE  10 mg Oral Q12H  . pantoprazole  40 mg Oral BID AC  .  potassium chloride  40 mEq Oral BID  . predniSONE  40 mg Oral Daily  . sertraline  100 mg Oral Daily    Principal Problem:  *Lymphocytic colitis Active Problems:  Diarrhea  Adrenal insufficiency  Hypothyroid   Assessment/Plan: 1. Lymphocytic colitis: GI following, Continue steroids, mesalamine and antidiarrheal, and narcotics for pain management. 2. Diarrhea: Worse per patient. Repeat c-diff pending. The c-diff at [facility] was positive by toxin and not PCR.  3. Adrenal insufficiency: Continue steroids 4. Hypothyroid - on synthroid  5. Gerd/acute esophagitis - PPI, continue GI cocktail, await further GI recs. 6. Anxiety/Depression - continue zoloft, will change clonazepam to ativan prn as per pt's request.  Full Code.   LOS: 13 days   Dawn Frost 02/09/2011, 3:29 PM

## 2011-02-09 NOTE — Progress Notes (Signed)
Patient seen and I agree with the above documentation, including the assessment and plan. Await c diff testing.   If negative, then need symptom control will awaiting a complete response from steroids for microscopic colitis. Need to wean off IV narcs.

## 2011-02-10 DIAGNOSIS — R197 Diarrhea, unspecified: Secondary | ICD-10-CM

## 2011-02-10 DIAGNOSIS — K219 Gastro-esophageal reflux disease without esophagitis: Secondary | ICD-10-CM

## 2011-02-10 DIAGNOSIS — K5289 Other specified noninfective gastroenteritis and colitis: Secondary | ICD-10-CM

## 2011-02-10 LAB — URINALYSIS, ROUTINE W REFLEX MICROSCOPIC
Bilirubin Urine: NEGATIVE
Glucose, UA: NEGATIVE mg/dL
Ketones, ur: NEGATIVE mg/dL
Nitrite: NEGATIVE
Urobilinogen, UA: 0.2 mg/dL (ref 0.0–1.0)

## 2011-02-10 LAB — URINE MICROSCOPIC-ADD ON

## 2011-02-10 LAB — COMPREHENSIVE METABOLIC PANEL
AST: 14 U/L (ref 0–37)
Albumin: 2.6 g/dL — ABNORMAL LOW (ref 3.5–5.2)
Calcium: 8.8 mg/dL (ref 8.4–10.5)
Creatinine, Ser: 0.72 mg/dL (ref 0.50–1.10)
Total Protein: 5.3 g/dL — ABNORMAL LOW (ref 6.0–8.3)

## 2011-02-10 LAB — CBC
MCH: 34.1 pg — ABNORMAL HIGH (ref 26.0–34.0)
MCV: 110.7 fL — ABNORMAL HIGH (ref 78.0–100.0)
Platelets: 217 10*3/uL (ref 150–400)
RDW: 13.4 % (ref 11.5–15.5)

## 2011-02-10 MED ORDER — PROMETHAZINE HCL 25 MG/ML IJ SOLN
12.5000 mg | Freq: Four times a day (QID) | INTRAMUSCULAR | Status: DC | PRN
  Administered 2011-02-10: 12.5 mg via INTRAVENOUS

## 2011-02-10 NOTE — Progress Notes (Signed)
Subjective: Patient is improving. Her diarrhea is much better today compared to yesterday. The last time she had a stool was about 8:00 this morning, and it's already been more than 5 hours. Denies any nausea, vomiting. Still has a lot of pain in her abdomen, but she is able to tolerate her by mouth intake. She feels like her esophagitis is also improving.  Objective: Vital signs in last 24 hours: Temp:  [97.6 F (36.4 C)-98.1 F (36.7 C)] 97.6 F (36.4 C) (11/08 0547) Pulse Rate:  [64-80] 64  (11/08 0547) Resp:  [16-18] 16  (11/08 0547) BP: (117-147)/(83-88) 147/88 mmHg (11/08 0547) SpO2:  [97 %-98 %] 98 % (11/08 0547) Weight change:  Last BM Date: 02/10/11  Intake/Output from previous day: 11/07 0701 - 11/08 0700 In: 2662.9 [P.O.:480; I.V.:2182.9] Out: 2590 [Urine:200; Stool:2390] Intake/Output this shift:    Physical Exam  Vitals reviewed. Constitutional: She is oriented to person, place, and time. She appears well-developed and well-nourished. No distress.  HENT:  Head: Normocephalic and atraumatic.  Mouth/Throat: Oropharynx is clear and moist. No oropharyngeal exudate.  Eyes: EOM are normal. No scleral icterus.  Cardiovascular: Normal rate and regular rhythm.  Exam reveals no friction rub.   No murmur heard. Pulmonary/Chest: Effort normal and breath sounds normal. No respiratory distress. She has no wheezes. She has no rales.  Abdominal: Soft. Bowel sounds are normal. There is generalized tenderness. There is no rebound, no guarding and negative Murphy's sign.  Musculoskeletal: Normal range of motion. She exhibits no edema.  Neurological: She is alert and oriented to person, place, and time. No cranial nerve deficit.  Skin: Skin is warm and dry.  Psychiatric: She has a normal mood and affect.    Lab Results:  Basename 02/10/11 0525 02/09/11 0530  WBC 7.4 10.5  HGB 11.2* 10.9*  HCT 36.3 34.8*  PLT 217 206   BMET  Basename 02/10/11 0525 02/09/11 0530  NA 141  137  K 4.4 3.8  CL 107 105  CO2 29 28  GLUCOSE 82 79  BUN 10 13  CREATININE 0.72 0.67  CALCIUM 8.8 8.3*    Results for orders placed during the hospital encounter of 01/27/11 (from the past 24 hour(s))  CLOSTRIDIUM DIFFICILE BY PCR     Status: Normal   Collection Time   02/09/11  3:30 PM      Component Value Range   C difficile by pcr NEGATIVE  NEGATIVE   CBC     Status: Abnormal   Collection Time   02/10/11  5:25 AM      Component Value Range   WBC 7.4  4.0 - 10.5 (K/uL)   RBC 3.28 (*) 3.87 - 5.11 (MIL/uL)   Hemoglobin 11.2 (*) 12.0 - 15.0 (g/dL)   HCT 54.0  98.1 - 19.1 (%)   MCV 110.7 (*) 78.0 - 100.0 (fL)   MCH 34.1 (*) 26.0 - 34.0 (pg)   MCHC 30.9  30.0 - 36.0 (g/dL)   RDW 47.8  29.5 - 62.1 (%)   Platelets 217  150 - 400 (K/uL)  COMPREHENSIVE METABOLIC PANEL     Status: Abnormal   Collection Time   02/10/11  5:25 AM      Component Value Range   Sodium 141  135 - 145 (mEq/L)   Potassium 4.4  3.5 - 5.1 (mEq/L)   Chloride 107  96 - 112 (mEq/L)   CO2 29  19 - 32 (mEq/L)   Glucose, Bld 82  70 - 99 (mg/dL)  BUN 10  6 - 23 (mg/dL)   Creatinine, Ser 1.61  0.50 - 1.10 (mg/dL)   Calcium 8.8  8.4 - 09.6 (mg/dL)   Total Protein 5.3 (*) 6.0 - 8.3 (g/dL)   Albumin 2.6 (*) 3.5 - 5.2 (g/dL)   AST 14  0 - 37 (U/L)   ALT 13  0 - 35 (U/L)   Alkaline Phosphatase 87  39 - 117 (U/L)   Total Bilirubin <0.1 (*) 0.3 - 1.2 (mg/dL)   GFR calc non Af Amer >90  >90 (mL/min)   GFR calc Af Amer >90  >90 (mL/min)    Studies/Results: No results found.  Medications:  Scheduled:    . diphenoxylate-atropine  1 tablet Oral BID  . enoxaparin (LOVENOX) injection  40 mg Subcutaneous Q24H  . Flora-Q  1 capsule Oral Daily  . hyoscyamine  0.375 mg Oral Daily  . levothyroxine  50 mcg Oral QAC breakfast  . mesalamine  1,600 mg Oral TID  . nicotine  14 mg Transdermal Daily  . oxyCODONE  10 mg Oral Q12H  . pantoprazole  40 mg Oral BID AC  . potassium chloride  40 mEq Oral BID  . predniSONE  40  mg Oral Daily  . sertraline  100 mg Oral Daily    Principal Problem:  *Lymphocytic colitis Active Problems:  Diarrhea  Adrenal insufficiency  Hypothyroid   Assessment/Plan: 1. Lymphocytic colitis: GI following, Continue steroids, mesalamine and antidiarrheal, and narcotics for pain management. 2. Diarrhea: Better today. Repeat c-diff is negative. The c-diff at [facility] was positive by toxin and not PCR.  3. Adrenal insufficiency: Continue steroids 4. Hypothyroid - on synthroid  5. Gerd/acute esophagitis - PPI, continue GI cocktail, await further GI recs. 6. Anxiety/Depression - continue zoloft, will change clonazepam to ativan prn as per pt's request.  Anticipate going home soon, possibly even in AM. Patient agreeable with plan. Will need narcotic prescriptions at discharge.  Full Code.   LOS: 14 days   Loribeth Katich 02/10/2011, 1:17 PM

## 2011-02-10 NOTE — Progress Notes (Signed)
Patient seen and I agree with the above documentation, including the assessment and plan. Pt should followup with Dr. Opal Sidles in [facility] or at [facility] with Dr. Doy Hutching, where she has already established care.

## 2011-02-10 NOTE — Progress Notes (Signed)
   [facility] Progress Note  Subjective: She is eating, esophagitis is better though no completely resolved. She continues with Diarrhea- but less-3 BMs during night. I suggested we increae the lomotil t 4 tabs daily and now she is worried stool will build up and make her feel worse !.   Stool for CDIFF PCR is negative again.  Objective: Vital signs in last 24 hours: Temp:  [97.6 F (36.4 C)-98.1 F (36.7 C)] 97.6 F (36.4 C) (11/08 0547) Pulse Rate:  [64-80] 64  (11/08 0547) Resp:  [16-18] 16  (11/08 0547) BP: (117-147)/(83-88) 147/88 mmHg (11/08 0547) SpO2:  [97 %-98 %] 98 % (11/08 0547) Last BM Date: 02/09/11 (frequent) General:   Alert,  Well-developed, well-nourished, pleasant and cooperative in NADIntake/Output from previous day: 11/07 0701 - 11/08 0700 In: 2662.9 [P.O.:480; I.V.:2182.9] Out: 2590 [Urine:200; Stool:2390] Intake/Output this shift:   Exam;  Discussion only today Lab Results:  Basename 02/10/11 0525 02/09/11 0530  WBC 7.4 10.5  HGB 11.2* 10.9*  HCT 36.3 34.8*  PLT 217 206   BMET  Basename 02/10/11 0525 02/09/11 0530  NA 141 137  K 4.4 3.8  CL 107 105  CO2 29 28  GLUCOSE 82 79  BUN 10 13  CREATININE 0.72 0.67  CALCIUM 8.8 8.3*   LFT  Basename 02/10/11 0525  PROT 5.3*  ALBUMIN 2.6*  AST 14  ALT 13  ALKPHOS 87  BILITOT <0.1*  BILIDIR --  IBILI --   PT/INR No results found for this basename: LABPROT:2,INR:2 in the last 72 hours Hepatitis Panel No results found for this basename: HEPBSAG,HCVAB,HEPAIGM,HEPBIGM in the last 72 hours    Assessment / Plan:  #1  Lymphocytic colitis-refractory- no evidence for Cdiff.  She is stable , and OK from Gi standpoint for discharge home. She should stay on prednisone 40 daily until she has follow up with Dr. Opal Sidles  Her regular GI in [place].Continue Asacol, Increase Lomotil to  4 daily-she may use up to 8 daily as needed.   # 2 Acute esophagitis -improving  Continue current  regimen, may need to give short term rx for GI cocktail for home .  LOS: 14 days   Dawn Frost  02/10/2011, 9:35 AM

## 2011-02-11 DIAGNOSIS — K209 Esophagitis, unspecified without bleeding: Secondary | ICD-10-CM | POA: Clinically undetermined

## 2011-02-11 MED ORDER — NICOTINE 14 MG/24HR TD PT24: 1.0000 | MEDICATED_PATCH | Freq: Every day | TRANSDERMAL | Status: DC

## 2011-02-11 MED ORDER — DIPHENOXYLATE-ATROPINE 2.5-0.025 MG PO TABS: 1.0000 | ORAL_TABLET | Freq: Four times a day (QID) | ORAL | Status: AC | PRN

## 2011-02-11 MED ORDER — HYOSCYAMINE SULFATE 0.125 MG PO TABS: 0.3750 mg | ORAL_TABLET | Freq: Every day | ORAL | Status: AC

## 2011-02-11 MED ORDER — PREDNISONE 20 MG PO TABS: 40.0000 mg | ORAL_TABLET | Freq: Every day | ORAL | Status: AC

## 2011-02-11 MED ORDER — OXYCODONE HCL 5 MG PO TABS: 5.0000 mg | ORAL_TABLET | ORAL | Status: DC | PRN

## 2011-02-11 MED ORDER — BENZOCAINE 10 % MT GEL: Freq: Three times a day (TID) | OROMUCOSAL | Status: DC | PRN

## 2011-02-11 MED ORDER — OXYCODONE HCL 10 MG PO TB12: ORAL_TABLET | ORAL | Status: DC

## 2011-02-11 MED ORDER — GI COCKTAIL ~~LOC~~: 30.0000 mL | ORAL | Status: DC | PRN

## 2011-02-11 MED ORDER — BENZOCAINE 10 % MT GEL
Freq: Three times a day (TID) | OROMUCOSAL | Status: DC | PRN
  Filled 2011-02-11: qty 9.4

## 2011-02-11 NOTE — Discharge Summary (Signed)
Physician Discharge Summary  Patient ID: Dawn Frost MRN: [id number] DOB/AGE: 45-Mar-1967 45 y.o.  Admit date: 01/27/2011 Discharge date: 02/11/2011  Admission Diagnoses: #1 diarrhea secondary to lymphocytic colitis.  #2 severe esophagitis secondary to steroids  Discharge Diagnoses:  Principal Problem:  *Lymphocytic colitis Active Problems:  Diarrhea  Adrenal insufficiency  Hypothyroid  Esophagitis   Discharged Condition: fair  Hospital Course: This is a 45 year old, Caucasian female, who has been diagnosed with lymphocytic colitis. She presented to the hospital with worsening diarrhea. She also started having difficulty swallowing. She was seen by gastroenterology and was diagnosed with esophagitis secondary to steroid use. She's been getting GI cocktail as needed. She was also on PPI. Patient underwent a colonoscopy, which surprisingly showed a normal colon. However, biopsies did show suggest a microscopic colitis.  The main challenge was that this patient has been to get her diarrhea under control. She continues to have loose stools, however, it appears, that it's getting more formed at times. It also appears that the amount that she is passing is also decreasing. She has good days when she has only, 4 to 5 episodes per day. And other days she has 8 bm's daily.  She was also hypokalemic when she was admitted and that has resolved with supplementation. She was also dehydrated, which was also been corrected with IV fluids.  There was a question as to whether patient had C. difficile. She had multiple C. difficile by PCR which were all negative. Apparently, at [facility] she had a positive C. difficile by toxin, which was positive. She was put on vancomycin, which she did not take at home, but was prescribed some antibiotics here initially. Once the C-diff by PCR came back negative this was discontinued. So it appears that her diarrhea is secondary to her colitis.  Patient has  been dealing with this situation for the past many weeks. The challenge will be to control her diarrhea. She's been given Lomotil which will hopefully control her symptoms.  She's been asked to follow up with her physicians at [facility] as soon as possible.  Rest of her medical issues are all stable.  Imaging Studies  Dg Shoulder Right  01/31/2011  *RADIOLOGY REPORT*  Clinical Data: Fall.  Pain.  RIGHT SHOULDER - 2+ VIEW  Comparison: None.  Findings: No fracture or dislocation.  Visualized lungs clear.  IMPRESSION: No fracture or dislocation.  Original Report Authenticated By: Fuller Canada, M.D.   Dg Forearm Right  01/31/2011  *RADIOLOGY REPORT*  Clinical Data: Pain.  Fall.  RIGHT FOREARM - 2 VIEW  Comparison: 12/30/2010 elbow series.  Findings: No fracture or dislocation.  If there is wrist pain, cone down series may be considered.  IMPRESSION: No fracture.  Please see above.  Original Report Authenticated By: Fuller Canada, M.D.   Ct Abdomen Pelvis W Contrast  01/30/2011  *RADIOLOGY REPORT*  Clinical Data: Right flank pain, colitis, evaluate for pyelonephritis  CT ABDOMEN AND PELVIS WITH CONTRAST  Technique:  Multidetector CT imaging of the abdomen and pelvis was performed following the standard protocol during bolus administration of intravenous contrast.  Contrast: OMNIPAQUE IOHEXOL 300 MG/ML IV SOLN  Comparison: 01/27/2011  Findings: Mild atelectasis at the lung bases.  Liver, spleen, pancreas, and adrenal glands are within normal limits.  Status post cholecystectomy.  No intrahepatic or extrahepatic ductal dilatation.  Three left and two right nonobstructing renal calculi, measuring up to 3 mm in the bilateral lower poles (series 2/images 32 and 36). No  hydronephrosis.  No findings to suggest pyelonephritis.  No evidence of bowel obstruction.  Normal appendix.  Colon is fluid- filled but without associated colonic wall thickening or inflammatory changes.  No evidence of  abdominal aortic aneurysm.  Small volume pelvic ascites, likely physiologic.  Uterus and bilateral ovaries are unremarkable, noting a right corpus luteal cyst and bilateral tubal ligation clips.  Bladder is within normal limits.  Mild degenerative changes of the visualized thoracolumbar spine.  IMPRESSION: No findings to suggest pyelonephritis.  Tiny bilateral nonobstructing renal calculi measuring up to 3 mm. No hydronephrosis.  Fluid-filled colon, without colonic wall thickening or associated inflammatory changes.  No evidence of bowel obstruction.  Normal appendix.  Original Report Authenticated By: Charline Bills, M.D.   Ct Abdomen Pelvis W Contrast  01/27/2011  *RADIOLOGY REPORT*  Clinical Data: Abdominal pain.  Diarrhea.  CT ABDOMEN AND PELVIS WITH CONTRAST  Technique:  Multidetector CT imaging of the abdomen and pelvis was performed following the standard protocol during bolus administration of intravenous contrast.  Contrast: OMNIPAQUE IOHEXOL 300 MG/ML IV SOLN  Comparison: CT scan 09/27/2010.  Findings: The lung bases are clear.  There is diffuse fatty infiltration of the liver but no focal hepatic lesions or intrahepatic biliary dilatation.  The gallbladder is surgically absent.  No common bile duct dilatation. The pancreas is unremarkable.  The spleen is normal in size.  No focal lesions.  The adrenal glands and kidneys are normal except for small bilateral renal calculi.  No obstructing ureteral calculi.  The stomach is unremarkable.  Mild contraction of the pylorus is noted the duodenum and small bowel are unremarkable.  There is moderate scattered fluid throughout the colon without obvious formed stool.  No pericolonic inflammatory changes.  No mass.  The appendix is normal.  Radiopaque densities are noted in the cecum. Fecalith versus ingested material.  The uterus and ovaries are unremarkable.  A small right ovarian cyst is noted.  There is fluid in the rectum and sigmoid colon. Tubal  ligation clips are noted.  No pelvic mass, adenopathy or free pelvic fluid collection.  No inguinal mass or hernia.  IMPRESSION:  1.  Mild diffuse inflammatory changes involving the colon with scattered fluid. 2.  Bilateral renal calculi but no obstructing ureteral calculi. 3.  Diffuse fatty infiltration of the liver.  Original Report Authenticated By: P. Loralie Champagne, M.D.    Discharge Exam: Blood pressure 106/69, pulse 59, temperature 98.3 F (36.8 C), temperature source Oral, resp. rate 16, height 5\' 4"  (1.626 m), weight 58.2 kg (128 lb 4.9 oz), last menstrual period 12/24/2010, SpO2 97.00%. Physical Exam  Vitals reviewed. Constitutional: She is oriented to person, place, and time. She appears well-developed.  HENT:  Head: Normocephalic and atraumatic.  Mouth/Throat: No oropharyngeal exudate.  Eyes: EOM are normal. Pupils are equal, round, and reactive to light. No scleral icterus.  Neck: No thyromegaly present.  Cardiovascular: Normal rate and regular rhythm.  Exam reveals no friction rub.   No murmur heard. Pulmonary/Chest: Effort normal and breath sounds normal. No respiratory distress.  Abdominal: Soft. There is tenderness. There is no rebound.  Musculoskeletal: Normal range of motion.  Neurological: She is alert and oriented to person, place, and time.  Skin: Skin is warm.  Psychiatric: She has a normal mood and affect.     Disposition: Short Term Hospital  Discharge Orders    Future Orders Please Complete By Expires   Diet - low sodium heart healthy      Increase  activity slowly      Discharge instructions      Comments:   FOLLOW UP WITH YOUR [facility] AS SOON AS POSSIBLE     Current Discharge Medication List    START taking these medications   Details  Alum & Mag Hydroxide-Simeth (GI COCKTAIL) SUSP Take 30 mLs by mouth every 4 (four) hours as needed (for chest pain, heartburn). Qty: 300 mL, Refills: 1    benzocaine (ORAJEL) 10 % mucosal gel Use as directed  in the mouth or throat 3 (three) times daily as needed for pain. Qty: 5.3 g, Refills: 0    diphenoxylate-atropine (LOMOTIL) 2.5-0.025 MG per tablet Take 1 tablet by mouth 4 (four) times daily as needed for diarrhea/loose stools. Qty: 30 tablet, Refills: 1    hyoscyamine (LEVSIN, ANASPAZ) 0.125 MG tablet Take 3 tablets (0.375 mg total) by mouth daily. Qty: 30 tablet, Refills: 0    nicotine (NICODERM CQ - DOSED IN MG/24 HOURS) 14 mg/24hr patch Place 1 patch onto the skin daily. Qty: 28 patch, Refills: 0    oxyCODONE (OXY IR/ROXICODONE) 5 MG immediate release tablet Take 1 tablet (5 mg total) by mouth every 4 (four) hours as needed. Qty: 30 tablet, Refills: 0    oxyCODONE (OXYCONTIN) 10 MG 12 hr tablet 10mg  twice daily for 2 weeks and then 10mg  once daily for 2 weeks, then stop Qty: 42 tablet, Refills: 0    predniSONE (DELTASONE) 20 MG tablet Take 2 tablets (40 mg total) by mouth daily. Qty: 60 tablet, Refills: 1      CONTINUE these medications which have NOT CHANGED   Details  bifidobacterium infantis (ALIGN) capsule Take 1 capsule by mouth daily.      clonazePAM (KLONOPIN) 1 MG tablet Take 1 mg by mouth 3 (three) times daily as needed. anxiety    levothyroxine (SYNTHROID, LEVOTHROID) 50 MCG tablet Take 50 mcg by mouth daily.      mesalamine (ASACOL) 400 MG EC tablet Take 800 mg by mouth 2 (two) times daily.      Multiple Vitamin (MULTIVITAMIN) tablet Take 1 tablet by mouth daily.      pantoprazole (PROTONIX) 40 MG tablet Take 40 mg by mouth daily.      potassium chloride SA (K-DUR,KLOR-CON) 20 MEQ tablet Take 1 tablet (20 mEq total) by mouth 2 (two) times daily. Qty: 30 tablet, Refills: 0    sertraline (ZOLOFT) 100 MG tablet Take 100 mg by mouth daily.        STOP taking these medications     hydrocortisone (CORTEF) 20 MG tablet          Signed: Margues Filippini 02/11/2011, 12:06 PM

## 2011-02-14 ENCOUNTER — Telehealth: Payer: Self-pay | Admitting: Gastroenterology

## 2011-02-14 NOTE — Telephone Encounter (Signed)
lmom for pt to call back. Pt had Flex Sig on 02/01/11 that showed normal colon, internal hemorrhoids and the path showed Colitis.

## 2011-02-14 NOTE — Telephone Encounter (Signed)
Pt requested a RTW letter effective 02/16/11 be emailed to [email or web address]. Done.

## 2011-02-16 ENCOUNTER — Telehealth: Payer: Self-pay | Admitting: Gastroenterology

## 2011-02-16 NOTE — Telephone Encounter (Signed)
Resent letter with return to work date.

## 2011-02-16 NOTE — Telephone Encounter (Signed)
Pt states she was discharged from the hospital on Friday, she is still having lots of diarrhea and nausea. Pt scheduled to see Dr. Arlyce Dice tomorrow at 3:45pm. Pt aware of appt date and time.

## 2011-02-17 ENCOUNTER — Ambulatory Visit (INDEPENDENT_AMBULATORY_CARE_PROVIDER_SITE_OTHER): Payer: 59 | Admitting: Gastroenterology

## 2011-02-17 ENCOUNTER — Encounter: Payer: Self-pay | Admitting: Gastroenterology

## 2011-02-17 VITALS — BP 102/76 | HR 64 | Ht 64.0 in | Wt 133.0 lb

## 2011-02-17 DIAGNOSIS — K52832 Lymphocytic colitis: Secondary | ICD-10-CM

## 2011-02-17 DIAGNOSIS — K5289 Other specified noninfective gastroenteritis and colitis: Secondary | ICD-10-CM

## 2011-02-17 NOTE — Patient Instructions (Addendum)
You have been given a separate informational sheet regarding your tobacco use, the importance of quitting and local resources to help you quit. You will need to decrease your Prednisone to 30mg  per day You will need to increase your Lomotil to 4 per day Follow up with Dr Steffanie Dunn in [facility]

## 2011-02-17 NOTE — Progress Notes (Signed)
Subjective:    Patient ID: Dawn Frost, female    DOB: 1965-07-22, 45 y.o.   MRN: [id number]  HPI Osvaldo Human is a 45 year old white female who was seen recently while we were covering on the signs at [facility]. No cysts of lymphocytic colitis and had been admitted with worsening diarrhea. She had previously been evaluated at [facility], and also has a gastroenterologist in [place]/Dr. Opal Sidles. She had gone to the emergency room at [place]/68 and was then admitted to [facility] long period She had 80 early long hospitalization with persistent complaints of diarrhea and then developed acute esophagitis symptoms. She was tested for C. difficile by PCR and this was negative. She did have colonoscopy with Dr. Arlyce Dice which showed a normal colon however biopsies did return showing microscopic colitis. She had been on steroids and was continued on prednisone at 40 mg daily. She almost also carries a diagnosis of adrenal insufficiency for which she is supposed to remain on chronic steroids. Her diet her diarrhea was difficult to control though felt to be of small volume in nature by the time she was discharged. She was discharged home on prednisone 40 mg by mouth daily, Lomotil 3-4 daily, Protonix twice daily and a GI cocktail as needed . It was our understanding at the time of discharge that she would followup with Dr. Opal Sidles and this was discussed with her at discharge as well.  Patient called her yesterday stating she was having problems with diarrhea and abdominal pain and was put on the schedule for today. She says she would like to transfer her care here as it is more convenient for her.  She has been eating very well says that she is hungry all the time and is actually gaining weight. She continues to complain of intermittent nausea and has persistent primarily right-sided abdominal pain. She reports that she's having anywhere from 4-10 loose bowel movements per day. She has  been using 3 Lomotil per day and is afraid if she uses for per day she will feel worse and she goes longer between bowel movements and the pain builds.    Review of Systems  Constitutional: Positive for appetite change and unexpected weight change.  HENT: Negative.   Eyes: Negative.   Respiratory: Negative.   Cardiovascular: Negative.   Gastrointestinal: Positive for nausea, abdominal pain and diarrhea.  Genitourinary: Negative.   Musculoskeletal: Negative.   Skin: Negative.   Neurological: Negative.   Hematological: Negative.   Psychiatric/Behavioral: The patient is nervous/anxious.    Outpatient Prescriptions Prior to Visit  Medication Sig Dispense Refill  . Alum & Mag Hydroxide-Simeth (GI COCKTAIL) SUSP Take 30 mLs by mouth every 4 (four) hours as needed (for chest pain, heartburn).  300 mL  1  . benzocaine (ORAJEL) 10 % mucosal gel Use as directed in the mouth or throat 3 (three) times daily as needed for pain.  5.3 g  0  . bifidobacterium infantis (ALIGN) capsule Take 1 capsule by mouth daily.        . clonazePAM (KLONOPIN) 1 MG tablet Take 1 mg by mouth 3 (three) times daily as needed. anxiety      . diphenoxylate-atropine (LOMOTIL) 2.5-0.025 MG per tablet Take 1 tablet by mouth 4 (four) times daily as needed for diarrhea/loose stools.  30 tablet  1  . hyoscyamine (LEVSIN, ANASPAZ) 0.125 MG tablet Take 3 tablets (0.375 mg total) by mouth daily.  30 tablet  0  . levothyroxine (SYNTHROID, LEVOTHROID) 50 MCG tablet  Take 50 mcg by mouth daily.        . mesalamine (ASACOL) 400 MG EC tablet Take 800 mg by mouth 2 (two) times daily.        . Multiple Vitamin (MULTIVITAMIN) tablet Take 1 tablet by mouth daily.        . pantoprazole (PROTONIX) 40 MG tablet Take 40 mg by mouth daily.        . potassium chloride SA (K-DUR,KLOR-CON) 20 MEQ tablet Take 1 tablet (20 mEq total) by mouth 2 (two) times daily.  30 tablet  0  . predniSONE (DELTASONE) 20 MG tablet Take 2 tablets (40 mg total) by  mouth daily.  60 tablet  1  . sertraline (ZOLOFT) 100 MG tablet Take 100 mg by mouth daily.        Marland Kitchen oxyCODONE (OXY IR/ROXICODONE) 5 MG immediate release tablet Take 1 tablet (5 mg total) by mouth every 4 (four) hours as needed.  30 tablet  0  . nicotine (NICODERM CQ - DOSED IN MG/24 HOURS) 14 mg/24hr patch Place 1 patch onto the skin daily.  28 patch  0  . oxyCODONE (OXYCONTIN) 10 MG 12 hr tablet 10mg  twice daily for 2 weeks and then 10mg  once daily for 2 weeks, then stop  42 tablet  0   Allergies  Allergen Reactions  . Zofran Other (See Comments)    Headache       Objective:   Physical Exam Well-developed white female in no acute distress, blood pressure 102/76, pulse 64 HEENT nontraumatic normocephalic EOMI PERRLA sclera ,Cardiovascular; regular rate and rhythm with S1-S2 no murmur or gallop, Pulmonary; clear bilaterally, Abdomen; soft bowel sounds somewhat hyperactive she is tender in the right lower quadrant and right mid quadrant no guarding or rebound no palpable masses or hepatosplenomegaly, Rectal; not done, Extremities; no clubbing cyanosis or edema, Psych; mood and affect normal and appropriate.        Assessment & Plan:  #49 45 year old female with lymphocytic colitis, recently hospitalized with exacerbation, now stable on prednisone 40 mg by mouth daily Lomotil 3-4 daily. #2 Recent acute esophagitis, patient eating well above though still using GI cocktail. #3 History of chronic adrenal insufficiency #4 hypothyroidism #5 depression Plan; Discussed with Dr. Arlyce Dice, and patient is told that she should return to the care of her long-time gastroenterologist Dr. Opal Sidles, and that we would not continue to follow her on an outpatient basis. She is advised today that she can decrease her prednisone to 30 mg by mouth daily and increase Lomotil to 4 tablets daily until she seen by Dr. Opal Sidles.

## 2011-02-18 NOTE — Progress Notes (Signed)
Reviewed and agree with management. Robert D. Kaplan, M.D., FACG  

## 2011-02-19 ENCOUNTER — Emergency Department (HOSPITAL_BASED_OUTPATIENT_CLINIC_OR_DEPARTMENT_OTHER)
Admission: EM | Admit: 2011-02-19 | Discharge: 2011-02-19 | Disposition: A | Discharge status: Home / Self Care | Payer: 59 | Attending: Emergency Medicine | Admitting: Emergency Medicine

## 2011-02-19 ENCOUNTER — Encounter (HOSPITAL_BASED_OUTPATIENT_CLINIC_OR_DEPARTMENT_OTHER): Payer: Self-pay | Admitting: *Deleted

## 2011-02-19 DIAGNOSIS — R109 Unspecified abdominal pain: Secondary | ICD-10-CM | POA: Insufficient documentation

## 2011-02-19 DIAGNOSIS — Z79899 Other long term (current) drug therapy: Secondary | ICD-10-CM | POA: Insufficient documentation

## 2011-02-19 DIAGNOSIS — F17200 Nicotine dependence, unspecified, uncomplicated: Secondary | ICD-10-CM | POA: Insufficient documentation

## 2011-02-19 DIAGNOSIS — E079 Disorder of thyroid, unspecified: Secondary | ICD-10-CM | POA: Insufficient documentation

## 2011-02-19 LAB — URINALYSIS, ROUTINE W REFLEX MICROSCOPIC
Bilirubin Urine: NEGATIVE
Hgb urine dipstick: NEGATIVE
Ketones, ur: NEGATIVE mg/dL
Specific Gravity, Urine: 1.027 (ref 1.005–1.030)
Urobilinogen, UA: 0.2 mg/dL (ref 0.0–1.0)

## 2011-02-19 LAB — CBC
Hemoglobin: 13.1 g/dL (ref 12.0–15.0)
MCH: 33.9 pg (ref 26.0–34.0)
Platelets: 209 10*3/uL (ref 150–400)
RBC: 3.87 MIL/uL (ref 3.87–5.11)
WBC: 11.7 10*3/uL — ABNORMAL HIGH (ref 4.0–10.5)

## 2011-02-19 LAB — COMPREHENSIVE METABOLIC PANEL
ALT: 30 U/L (ref 0–35)
Alkaline Phosphatase: 111 U/L (ref 39–117)
BUN: 14 mg/dL (ref 6–23)
CO2: 23 mEq/L (ref 19–32)
Chloride: 107 mEq/L (ref 96–112)
GFR calc Af Amer: >90 mL/min (ref >90)
GFR calc non Af Amer: >90 mL/min (ref >90)
Glucose, Bld: 117 mg/dL — ABNORMAL HIGH (ref 70–99)
Potassium: 3.9 mEq/L (ref 3.5–5.1)
Sodium: 140 mEq/L (ref 135–145)
Total Bilirubin: 0.1 mg/dL — ABNORMAL LOW (ref 0.3–1.2)
Total Protein: 6.8 g/dL (ref 6.0–8.3)

## 2011-02-19 LAB — DIFFERENTIAL
Lymphocytes Relative: 22 % (ref 12–46)
Lymphs Abs: 2.6 10*3/uL (ref 0.7–4.0)
Monocytes Relative: 5 % (ref 3–12)
Neutrophils Relative %: 70 % (ref 43–77)

## 2011-02-19 LAB — LIPASE, BLOOD: Lipase: 12 U/L (ref 11–59)

## 2011-02-19 MED ORDER — METOCLOPRAMIDE HCL 5 MG/ML IJ SOLN
10.0000 mg | Freq: Once | INTRAMUSCULAR | Status: AC
  Administered 2011-02-19: 10 mg via INTRAVENOUS

## 2011-02-19 MED ORDER — PROMETHAZINE HCL 25 MG PO TABS: 25.0000 mg | ORAL_TABLET | Freq: Four times a day (QID) | ORAL | Status: DC | PRN

## 2011-02-19 MED ORDER — METOCLOPRAMIDE HCL 5 MG/ML IJ SOLN
INTRAMUSCULAR | Status: AC
  Filled 2011-02-19: qty 2

## 2011-02-19 MED ORDER — HYDROMORPHONE HCL PF 1 MG/ML IJ SOLN
1.0000 mg | Freq: Once | INTRAMUSCULAR | Status: AC
  Administered 2011-02-19: 1 mg via INTRAVENOUS

## 2011-02-19 MED ORDER — HYDROMORPHONE HCL PF 1 MG/ML IJ SOLN
INTRAMUSCULAR | Status: AC
  Filled 2011-02-19: qty 1

## 2011-02-19 MED ORDER — SODIUM CHLORIDE 0.9 % IV SOLN
Freq: Once | INTRAVENOUS | Status: AC
  Administered 2011-02-19: 19:00:00 via INTRAVENOUS

## 2011-02-19 MED ORDER — HYDROMORPHONE HCL PF 1 MG/ML IJ SOLN
1.0000 mg | Freq: Once | INTRAMUSCULAR | Status: AC
  Administered 2011-02-19: 1 mg via INTRAVENOUS
  Filled 2011-02-19: qty 1

## 2011-02-19 MED ORDER — ONDANSETRON HCL 4 MG/2ML IJ SOLN
4.0000 mg | Freq: Once | INTRAMUSCULAR | Status: AC
  Administered 2011-02-19: 4 mg via INTRAVENOUS
  Filled 2011-02-19: qty 2

## 2011-02-19 NOTE — ED Provider Notes (Signed)
History     CSN: [id number] Arrival date & time: 02/19/2011  5:36 PM   First MD Initiated Contact with Patient 02/19/11 1753      Chief Complaint  Patient presents with  . Abdominal Pain    (Consider location/radiation/quality/duration/timing/severity/associated sxs/prior treatment) Patient is a 45 y.o. female presenting with abdominal pain. The history is provided by the patient.  Abdominal Pain The primary symptoms of the illness include abdominal pain, nausea, vomiting and diarrhea. The current episode started yesterday. The onset of the illness was gradual.  The abdominal pain began yesterday. The pain came on gradually. The abdominal pain is located in the RUQ (She has a history of colitis, recent hospitalization, discharged one week prior for same. ). The abdominal pain is relieved by nothing.  Symptoms associated with the illness do not include chills.    Past Medical History  Diagnosis Date  . Colitis   . WPW (Wolff-Parkinson-White syndrome)   . Kidney calculi   . Kidney stone   . Addison disease   . Thyroid disease   . Clostridium difficile diarrhea     Past Surgical History  Procedure Date  . Cholecystectomy   . Tubal ligation   . Lithotripsy     Family History  Problem Relation Age of Onset  . Colon cancer Neg Hx     History  Substance Use Topics  . Smoking status: Current Everyday Smoker -- 1.0 packs/day for 30 years    Types: Cigarettes  . Smokeless tobacco: Never Used  . Alcohol Use: No    OB History    Grav Para Term Preterm Abortions TAB SAB Ect Mult Living   3 3              Review of Systems  Constitutional: Negative for chills.  Respiratory: Negative.   Cardiovascular: Negative.   Gastrointestinal: Positive for nausea, vomiting, abdominal pain and diarrhea.  Genitourinary: Negative.   Musculoskeletal: Negative.   Neurological: Negative.     Allergies  Zofran  Home Medications   Current Outpatient Rx  Name Route Sig Dispense  Refill  . GI COCKTAIL [place] Oral Take 30 mLs by mouth every 4 (four) hours as needed (for chest pain, heartburn). 300 mL 1  . ALIGN PO CAPS Oral Take 1 capsule by mouth daily.      Marland Kitchen CLONAZEPAM 1 MG PO TABS Oral Take 1 mg by mouth 3 (three) times daily as needed. anxiety    . DIPHENOXYLATE-ATROPINE 2.5-0.025 MG PO TABS Oral Take 1 tablet by mouth 4 (four) times daily as needed for diarrhea/loose stools. 30 tablet 1  . HYOSCYAMINE SULFATE 0.125 MG PO TABS Oral Take 3 tablets (0.375 mg total) by mouth daily. 30 tablet 0  . LEVOTHYROXINE SODIUM 50 MCG PO TABS Oral Take 50 mcg by mouth daily.      Marland Kitchen MESALAMINE 400 MG PO TBEC Oral Take 800 mg by mouth 2 (two) times daily.      Marland Kitchen ONE-DAILY MULTI VITAMINS PO TABS Oral Take 1 tablet by mouth daily.      . OXYCODONE HCL 5 MG PO CAPS Oral Take 5 mg by mouth every 4 (four) hours as needed. For pain    . PANTOPRAZOLE SODIUM 40 MG PO TBEC Oral Take 40 mg by mouth daily.      Marland Kitchen POTASSIUM CHLORIDE CRYS CR 20 MEQ PO TBCR Oral Take 1 tablet (20 mEq total) by mouth 2 (two) times daily. 30 tablet 0  . PREDNISONE 20 MG  PO TABS Oral Take 2 tablets (40 mg total) by mouth daily. 60 tablet 1  . SERTRALINE HCL 100 MG PO TABS Oral Take 100 mg by mouth daily.      Marland Kitchen PROMETHAZINE HCL 25 MG PO TABS Oral Take 1 tablet (25 mg total) by mouth every 6 (six) hours as needed for nausea. 30 tablet 0    BP 124/83  Pulse 106  Temp(Src) 98 F (36.7 C) (Oral)  Resp 20  Ht 5\' 4"  (1.626 m)  Wt 133 lb (60.328 kg)  BMI 22.83 kg/m2  SpO2 98%  LMP 01/29/2011  Physical Exam  ED Course  Procedures (including critical care time)  Labs Reviewed  CBC - Abnormal; Notable for the following:    WBC 11.7 (*)    MCV 101.8 (*)    All other components within normal limits  DIFFERENTIAL - Abnormal; Notable for the following:    Neutro Abs 8.1 (*)    All other components within normal limits  COMPREHENSIVE METABOLIC PANEL - Abnormal; Notable for the following:    Glucose, Bld  117 (*)    Total Bilirubin 0.1 (*)    All other components within normal limits  URINALYSIS, ROUTINE W REFLEX MICROSCOPIC - Abnormal; Notable for the following:    Appearance HAZY (*)    All other components within normal limits  LIPASE, BLOOD   No results found.   1. Abdominal pain       MDM  Re-eval:  Patient feeling better, feels like she can go home.    Medical screening examination/treatment/procedure(s) were performed by non-physician practitioner and as supervising physician I was immediately available for consultation/collaboration. Osvaldo Human, M.D.     Rodena Medin, PA 02/19/11 2126  Carleene Cooper III, MD 02/20/11 [phone]

## 2011-02-19 NOTE — ED Notes (Signed)
Patient is resting comfortably.Improved with pain medication IVF infusing without difficulty

## 2011-02-19 NOTE — ED Notes (Signed)
Pt states she was discharged about a week ago from [facility]. Dx'd with lymphocytic colitis and c-diff. Began having N/V/D yesterday.

## 2011-02-25 ENCOUNTER — Emergency Department (HOSPITAL_BASED_OUTPATIENT_CLINIC_OR_DEPARTMENT_OTHER)
Admission: EM | Admit: 2011-02-25 | Discharge: 2011-02-26 | Disposition: A | Discharge status: Other Acute Inpatient Hospital | Payer: 59 | Attending: Emergency Medicine | Admitting: Emergency Medicine

## 2011-02-25 ENCOUNTER — Encounter (HOSPITAL_BASED_OUTPATIENT_CLINIC_OR_DEPARTMENT_OTHER): Payer: Self-pay

## 2011-02-25 DIAGNOSIS — R197 Diarrhea, unspecified: Secondary | ICD-10-CM | POA: Insufficient documentation

## 2011-02-25 DIAGNOSIS — E876 Hypokalemia: Secondary | ICD-10-CM | POA: Insufficient documentation

## 2011-02-25 DIAGNOSIS — R109 Unspecified abdominal pain: Secondary | ICD-10-CM | POA: Insufficient documentation

## 2011-02-25 LAB — URINALYSIS, ROUTINE W REFLEX MICROSCOPIC
Bilirubin Urine: NEGATIVE
Glucose, UA: NEGATIVE mg/dL
Hgb urine dipstick: NEGATIVE
Ketones, ur: NEGATIVE mg/dL
Leukocytes, UA: NEGATIVE
Nitrite: NEGATIVE
Protein, ur: NEGATIVE mg/dL
Specific Gravity, Urine: 1.025 (ref 1.005–1.030)
Urobilinogen, UA: 0.2 mg/dL (ref 0.0–1.0)
pH: 5.5 (ref 5.0–8.0)

## 2011-02-25 LAB — CBC
HCT: 37.2 % (ref 36.0–46.0)
Hemoglobin: 12.7 g/dL (ref 12.0–15.0)
MCH: 34.2 pg — ABNORMAL HIGH (ref 26.0–34.0)
MCHC: 34.1 g/dL (ref 30.0–36.0)
MCV: 100.3 fL — ABNORMAL HIGH (ref 78.0–100.0)
Platelets: 256 10*3/uL (ref 150–400)
RBC: 3.71 MIL/uL — ABNORMAL LOW (ref 3.87–5.11)
RDW: 12.4 % (ref 11.5–15.5)
WBC: 12.8 10*3/uL — ABNORMAL HIGH (ref 4.0–10.5)

## 2011-02-25 LAB — DIFFERENTIAL
Basophils Absolute: 0.1 10*3/uL (ref 0.0–0.1)
Basophils Relative: 0 % (ref 0–1)
Eosinophils Absolute: 0.6 10*3/uL (ref 0.0–0.7)
Eosinophils Relative: 5 % (ref 0–5)
Lymphocytes Relative: 23 % (ref 12–46)
Lymphs Abs: 3 10*3/uL (ref 0.7–4.0)
Monocytes Absolute: 0.7 10*3/uL (ref 0.1–1.0)
Monocytes Relative: 6 % (ref 3–12)
Neutro Abs: 8.5 10*3/uL — ABNORMAL HIGH (ref 1.7–7.7)
Neutrophils Relative %: 66 % (ref 43–77)

## 2011-02-25 LAB — COMPREHENSIVE METABOLIC PANEL
ALT: 41 U/L — ABNORMAL HIGH (ref 0–35)
CO2: 18 mEq/L — ABNORMAL LOW (ref 19–32)
Calcium: 8.5 mg/dL (ref 8.4–10.5)
Chloride: 111 mEq/L (ref 96–112)
Creatinine, Ser: 0.6 mg/dL (ref 0.50–1.10)
GFR calc Af Amer: >90 mL/min (ref >90)
GFR calc non Af Amer: >90 mL/min (ref >90)
Glucose, Bld: 90 mg/dL (ref 70–99)
Total Bilirubin: 0.2 mg/dL — ABNORMAL LOW (ref 0.3–1.2)

## 2011-02-25 MED ORDER — MORPHINE SULFATE 4 MG/ML IJ SOLN
6.0000 mg | Freq: Once | INTRAMUSCULAR | Status: AC
  Administered 2011-02-25: 6 mg via INTRAVENOUS
  Filled 2011-02-25: qty 1

## 2011-02-25 MED ORDER — PROMETHAZINE HCL 25 MG/ML IJ SOLN
12.5000 mg | Freq: Once | INTRAMUSCULAR | Status: AC
  Administered 2011-02-25: 12.5 mg via INTRAVENOUS
  Filled 2011-02-25: qty 1

## 2011-02-25 MED ORDER — SODIUM CHLORIDE 0.9 % IV SOLN
INTRAVENOUS | Status: DC
  Administered 2011-02-25: 1000 mL via INTRAVENOUS

## 2011-02-25 MED ORDER — SODIUM CHLORIDE 0.9 % IV SOLN
Freq: Once | INTRAVENOUS | Status: AC
  Administered 2011-02-25: 1000 mL via INTRAVENOUS

## 2011-02-25 MED ORDER — POTASSIUM CHLORIDE 20 MEQ/15ML (10%) PO LIQD
60.0000 meq | Freq: Once | ORAL | Status: AC
  Administered 2011-02-26: 60 meq via ORAL
  Filled 2011-02-25: qty 45

## 2011-02-25 MED ORDER — MORPHINE SULFATE 2 MG/ML IJ SOLN
INTRAMUSCULAR | Status: AC
  Administered 2011-02-25: 2 mg via INTRAVENOUS
  Filled 2011-02-25: qty 1

## 2011-02-25 MED ORDER — HYDROMORPHONE HCL PF 1 MG/ML IJ SOLN
1.0000 mg | Freq: Once | INTRAMUSCULAR | Status: AC
  Administered 2011-02-25: 1 mg via INTRAVENOUS
  Filled 2011-02-25: qty 1

## 2011-02-25 NOTE — ED Provider Notes (Signed)
History     CSN: [id number] Arrival date & time: 02/25/2011  8:27 PM   First MD Initiated Contact with Patient 02/25/11 2028      Chief Complaint  Patient presents with  . Emesis  . Diarrhea    (Consider location/radiation/quality/duration/timing/severity/associated sxs/prior treatment) Patient is a 45 y.o. female presenting with vomiting and diarrhea. The history is provided by the patient.  Emesis  Associated symptoms include abdominal pain and diarrhea. Pertinent negatives include no chills, no cough, no fever, no headaches, no myalgias and no URI.  Diarrhea The primary symptoms include abdominal pain, vomiting and diarrhea. Primary symptoms do not include fever or myalgias.  The illness does not include chills.   patient has chronic abdominal pain, and states that her diarrhea began to pick up 2 days ago.  She also had increasing abdominal pain that is diffuse in nature.  States that she spoke with her doctor on Wednesday and said that she may need further testing since it is the holiday.  Patient states that she has nausea, vomiting, diarrhea, and abdominal cramping.  She denies fevers, chest pain, shortness of breath, weakness, or urinary symptoms.  Past Medical History  Diagnosis Date  . Colitis   . WPW (Wolff-Parkinson-White syndrome)   . Kidney calculi   . Kidney stone   . Addison disease   . Thyroid disease   . Clostridium difficile diarrhea     Past Surgical History  Procedure Date  . Cholecystectomy   . Tubal ligation   . Lithotripsy     Family History  Problem Relation Age of Onset  . Colon cancer Neg Hx     History  Substance Use Topics  . Smoking status: Current Everyday Smoker -- 1.0 packs/day for 30 years    Types: Cigarettes  . Smokeless tobacco: Never Used  . Alcohol Use: No    OB History    Grav Para Term Preterm Abortions TAB SAB Ect Mult Living   3 3              Review of Systems  Constitutional: Negative for fever and chills.    Respiratory: Negative for cough.   Gastrointestinal: Positive for vomiting, abdominal pain and diarrhea.  Musculoskeletal: Negative for myalgias.  Neurological: Negative for headaches.    Allergies  Zofran  Home Medications   Current Outpatient Rx  Name Route Sig Dispense Refill  . GI COCKTAIL [place] Oral Take 30 mLs by mouth every 4 (four) hours as needed (for chest pain, heartburn). 300 mL 1  . ALIGN PO CAPS Oral Take 1 capsule by mouth daily.      Marland Kitchen CLONAZEPAM 1 MG PO TABS Oral Take 1 mg by mouth 3 (three) times daily as needed. anxiety    . LEVOTHYROXINE SODIUM 50 MCG PO TABS Oral Take 50 mcg by mouth daily.      Marland Kitchen MESALAMINE 400 MG PO TBEC Oral Take 800 mg by mouth 2 (two) times daily.      Marland Kitchen ONE-DAILY MULTI VITAMINS PO TABS Oral Take 1 tablet by mouth daily.      Marland Kitchen PANTOPRAZOLE SODIUM 40 MG PO TBEC Oral Take 40 mg by mouth daily.      Marland Kitchen POTASSIUM CHLORIDE CRYS CR 20 MEQ PO TBCR Oral Take 1 tablet (20 mEq total) by mouth 2 (two) times daily. 30 tablet 0  . PROMETHAZINE HCL 25 MG PO TABS Oral Take 1 tablet (25 mg total) by mouth every 6 (six) hours as needed for  nausea. 30 tablet 0  . SERTRALINE HCL 100 MG PO TABS Oral Take 100 mg by mouth daily.      . OXYCODONE HCL 5 MG PO CAPS Oral Take 5 mg by mouth every 4 (four) hours as needed. For pain      BP 106/78  Pulse 92  Temp(Src) 98.8 F (37.1 C) (Oral)  Resp 16  Ht 5\' 4"  (1.626 m)  Wt 130 lb (58.968 kg)  BMI 22.31 kg/m2  SpO2 100%  LMP 01/29/2011  Physical Exam  Constitutional: She appears well-developed and well-nourished. She appears distressed.  HENT:  Head: Normocephalic and atraumatic.  Eyes: Pupils are equal, round, and reactive to light.  Neck: Normal range of motion. Neck supple.  Cardiovascular: Normal rate, regular rhythm and normal heart sounds.   Pulmonary/Chest: Effort normal and breath sounds normal. No respiratory distress. She has no wheezes. She has no rales.  Abdominal: Soft. Normal appearance  and bowel sounds are normal. She exhibits no distension, no ascites and no mass. There is no hepatosplenomegaly. There is generalized tenderness. There is no rigidity, no rebound and no guarding. No hernia.  Skin: Skin is warm and dry. No rash noted.    ED Course  Procedures (including critical care time)  Labs Reviewed  CBC - Abnormal; Notable for the following:    WBC 12.8 (*)    RBC 3.71 (*)    MCV 100.3 (*)    MCH 34.2 (*)    All other components within normal limits  DIFFERENTIAL - Abnormal; Notable for the following:    Neutro Abs 8.5 (*)    All other components within normal limits  COMPREHENSIVE METABOLIC PANEL - Abnormal; Notable for the following:    Potassium 2.8 (*)    CO2 18 (*)    Albumin 3.4 (*)    ALT 41 (*)    Total Bilirubin 0.2 (*)    All other components within normal limits  LIPASE, BLOOD  URINALYSIS, ROUTINE W REFLEX MICROSCOPIC      Patient given pain control and fluids.  We will reassess her pain.   10:44 PM I reassessed the patient.  She states feeling some better, but still having pain.  She states her nausea is returning. MDM          Carlyle Dolly, PA 02/25/11 2240  Carlyle Dolly, PA 02/25/11 2245

## 2011-02-25 NOTE — ED Notes (Signed)
N/v/d,abd cramps,reoprts "feeling disoriented"-pt NAD/ alert and appropriately answering all ?s in triage

## 2011-02-26 ENCOUNTER — Encounter (HOSPITAL_BASED_OUTPATIENT_CLINIC_OR_DEPARTMENT_OTHER): Payer: Self-pay | Admitting: Emergency Medicine

## 2011-02-26 NOTE — ED Notes (Signed)
Called report to Christina,RN at [facility]

## 2011-02-26 NOTE — ED Provider Notes (Signed)
Medical screening examination/treatment/procedure(s) were conducted as a shared visit with non-physician practitioner(s) and myself.  I personally evaluated the patient during the encounter  Diffuse abd ttp, no r/g. Worsening diarrhea. C diff neg x 3 in the past 4 weeks. HO lymphocytic colitis, multiple CT A/P in our system in past. D/W Her GI MD, Dr. Opal Sidles, who has accepted her for transfer to [facility].   Forbes Cellar, MD 02/26/11 [phone]

## 2011-02-26 NOTE — ED Notes (Signed)
Has been up to [facility] x 2 to void   No problems.  Called husband about transfer.

## 2011-03-08 ENCOUNTER — Ambulatory Visit: Payer: 59 | Admitting: Gastroenterology

## 2011-03-12 ENCOUNTER — Emergency Department (HOSPITAL_COMMUNITY): Payer: 59

## 2011-03-12 ENCOUNTER — Emergency Department (HOSPITAL_COMMUNITY)
Admission: EM | Admit: 2011-03-12 | Discharge: 2011-03-13 | Disposition: A | Discharge status: Home / Self Care | Payer: 59 | Attending: Emergency Medicine | Admitting: Emergency Medicine

## 2011-03-12 ENCOUNTER — Encounter (HOSPITAL_COMMUNITY): Payer: Self-pay | Admitting: *Deleted

## 2011-03-12 DIAGNOSIS — R197 Diarrhea, unspecified: Secondary | ICD-10-CM | POA: Insufficient documentation

## 2011-03-12 DIAGNOSIS — G8929 Other chronic pain: Secondary | ICD-10-CM

## 2011-03-12 DIAGNOSIS — R109 Unspecified abdominal pain: Secondary | ICD-10-CM | POA: Insufficient documentation

## 2011-03-12 DIAGNOSIS — R112 Nausea with vomiting, unspecified: Secondary | ICD-10-CM | POA: Insufficient documentation

## 2011-03-12 HISTORY — DX: Irritable bowel syndrome, unspecified: K58.9

## 2011-03-12 HISTORY — DX: Depression, unspecified: F32.A

## 2011-03-12 HISTORY — DX: Other chronic pain: G89.29

## 2011-03-12 HISTORY — DX: Anxiety disorder, unspecified: F41.9

## 2011-03-12 HISTORY — DX: Noninfective gastroenteritis and colitis, unspecified: K52.9

## 2011-03-12 HISTORY — DX: Dorsalgia, unspecified: M54.9

## 2011-03-12 HISTORY — DX: Malingerer (conscious simulation): Z76.5

## 2011-03-12 HISTORY — DX: Major depressive disorder, single episode, unspecified: F32.9

## 2011-03-12 HISTORY — DX: Anemia, unspecified: D64.9

## 2011-03-12 LAB — URINALYSIS, ROUTINE W REFLEX MICROSCOPIC
Ketones, ur: NEGATIVE mg/dL
Leukocytes, UA: NEGATIVE
Nitrite: NEGATIVE
Protein, ur: NEGATIVE mg/dL
Urobilinogen, UA: 0.2 mg/dL (ref 0.0–1.0)

## 2011-03-12 LAB — COMPREHENSIVE METABOLIC PANEL
Albumin: 3.6 g/dL (ref 3.5–5.2)
Alkaline Phosphatase: 104 U/L (ref 39–117)
BUN: 19 mg/dL (ref 6–23)
Calcium: 9.2 mg/dL (ref 8.4–10.5)
Potassium: 3.1 mEq/L — ABNORMAL LOW (ref 3.5–5.1)
Total Protein: 7.1 g/dL (ref 6.0–8.3)

## 2011-03-12 LAB — URINE MICROSCOPIC-ADD ON

## 2011-03-12 LAB — CBC
MCH: 32.8 pg (ref 26.0–34.0)
MCHC: 33.5 g/dL (ref 30.0–36.0)
Platelets: 360 10*3/uL (ref 150–400)

## 2011-03-12 LAB — DIFFERENTIAL
Basophils Absolute: 0.1 10*3/uL (ref 0.0–0.1)
Basophils Relative: 1 % (ref 0–1)
Eosinophils Absolute: 0.3 10*3/uL (ref 0.0–0.7)
Neutrophils Relative %: 54 % (ref 43–77)

## 2011-03-12 LAB — LIPASE, BLOOD: Lipase: 35 U/L (ref 11–59)

## 2011-03-12 MED ORDER — SODIUM CHLORIDE 0.9 % IV SOLN
INTRAVENOUS | Status: DC
  Administered 2011-03-12: 20:00:00 via INTRAVENOUS

## 2011-03-12 MED ORDER — POTASSIUM CHLORIDE CRYS ER 20 MEQ PO TBCR
EXTENDED_RELEASE_TABLET | ORAL | Status: AC
  Administered 2011-03-12: 40 meq
  Filled 2011-03-12: qty 2

## 2011-03-12 MED ORDER — MORPHINE SULFATE 2 MG/ML IJ SOLN
INTRAMUSCULAR | Status: AC
  Administered 2011-03-12: 4 mg via INTRAMUSCULAR
  Filled 2011-03-12: qty 2

## 2011-03-12 MED ORDER — ONDANSETRON HCL 4 MG/2ML IJ SOLN
4.0000 mg | INTRAMUSCULAR | Status: AC | PRN
  Administered 2011-03-12 – 2011-03-13 (×2): 4 mg via INTRAVENOUS
  Filled 2011-03-12 (×2): qty 2

## 2011-03-12 MED ORDER — MORPHINE SULFATE 4 MG/ML IJ SOLN: 4.0000 mg | INTRAMUSCULAR | Status: DC | PRN

## 2011-03-12 MED ORDER — POTASSIUM CHLORIDE 20 MEQ PO PACK
40.0000 meq | PACK | Freq: Once | ORAL | Status: DC
  Filled 2011-03-12: qty 2

## 2011-03-12 MED ORDER — MORPHINE SULFATE 2 MG/ML IJ SOLN
4.0000 mg | INTRAMUSCULAR | Status: DC | PRN
  Administered 2011-03-12: 4 mg via INTRAMUSCULAR
  Filled 2011-03-12: qty 1

## 2011-03-12 MED ORDER — IOHEXOL 300 MG/ML  SOLN
100.0000 mL | Freq: Once | INTRAMUSCULAR | Status: AC | PRN
  Administered 2011-03-12: 100 mL via INTRAVENOUS

## 2011-03-12 NOTE — ED Notes (Addendum)
Last visit to ED 01/27/11 Dsch 02/11/11 r/t colitis  This is similar to historical events. Pain to RLQ normal. Pain to LLQ unusual. Fatigue is not usual, has not been out of bed for 3 days.

## 2011-03-12 NOTE — ED Notes (Signed)
This is similar to typical "lymphatiac colitis". Difference is that diarrhea if "bright lemon yellow" Experiencing heart fluttering and leg cramps both of which are common for her when potasium is low. Also noting unusual amount of fatigue for 3 days. Not been out of bed for 3 days. This is very unusual. Frequents ED for similar events for IV fluids. Pain RLQ is common, pain to LLQ is not typical.

## 2011-03-12 NOTE — ED Notes (Signed)
Patient transported to CT 

## 2011-03-12 NOTE — ED Provider Notes (Signed)
History     CSN: [id number] Arrival date & time: 03/12/2011  4:57 PM   Chief Complaint  Patient presents with  . Abdominal Pain  . Nausea  . Emesis  . Diarrhea   HPI Pt was seen at 1920.  Per pt, c/o gradual onset and persistence of constant acute flair of her chronic abd pain x3 days.  Has been associated with feeling "tired" and intermittent "legs cramping."  Pt is concerned her "potassium might be low."  Pt has hx of chronic abd pain with multiple ED evals and admits for same over the past several years.  Denies back/flank pain, no fevers, no dysuria/hematuria, no vaginal bleeding/discharge, no N/V/D, no rash, no black or blood in stools, no CP/SOB.    Past Medical History  Diagnosis Date  . Colitis   . WPW (Wolff-Parkinson-White syndrome)   . Kidney calculi   . Kidney stone   . Addison disease   . Thyroid disease   . Clostridium difficile diarrhea   . Anxiety and depression   . Chronic diarrhea   . Chronic back pain   . Drug-seeking behavior   . Irritable bowel syndrome (IBS)   . Anemia     Past Surgical History  Procedure Date  . Cholecystectomy   . Tubal ligation   . Lithotripsy   . Sphincterotomy   . Cardiac electrophysiology mapping and ablation     for WPW    Family History  Problem Relation Age of Onset  . Colon cancer Neg Hx     History  Substance Use Topics  . Smoking status: Current Everyday Smoker -- 1.0 packs/day for 30 years    Types: Cigarettes  . Smokeless tobacco: Never Used  . Alcohol Use: No    OB History    Grav Para Term Preterm Abortions TAB SAB Ect Mult Living   3 3              Review of Systems ROS: Statement: All systems negative except as marked or noted in the HPI; Constitutional: Negative for fever and chills. +generalized fatigue. ; Eyes: Negative for eye pain, redness and discharge. ; ; ENMT: Negative for ear pain, hoarseness, nasal congestion, sinus pressure and sore throat. ; ; Cardiovascular: Negative for chest pain,  palpitations, diaphoresis, dyspnea and peripheral edema. ; ; Respiratory: Negative for cough, wheezing and stridor. ; ; Gastrointestinal: +abd pain. Negative for nausea, vomiting, diarrhea, blood in stool, hematemesis, jaundice and rectal bleeding. . ; ; Genitourinary: Negative for dysuria, flank pain and hematuria. ;GYN:  No vaginal bleeding, no vaginal discharge, no vulvar pain.; Musculoskeletal: +leg cramps. Negative for back pain and neck pain. Negative for swelling and trauma.; ; Skin: Negative for pruritus, rash, abrasions, blisters, bruising and skin lesion.; ; Neuro: Negative for headache, lightheadedness and neck stiffness. Negative for weakness, altered level of consciousness , altered mental status, extremity weakness, paresthesias, involuntary movement, seizure and syncope.     Allergies  Zofran  Home Medications   Current Outpatient Rx  Name Route Sig Dispense Refill  . GI COCKTAIL [place] Oral Take 30 mLs by mouth every 4 (four) hours as needed (for chest pain, heartburn). 300 mL 1  . ALIGN PO CAPS Oral Take 1 capsule by mouth daily.      Marland Kitchen CLONAZEPAM 1 MG PO TABS Oral Take 1 mg by mouth 3 (three) times daily as needed. anxiety    . HYDROCORTISONE 20 MG PO TABS Oral Take 20 mg by mouth 2 (two) times  daily.      Marland Kitchen LEVOTHYROXINE SODIUM 50 MCG PO TABS Oral Take 50 mcg by mouth daily.      Marland Kitchen MESALAMINE 400 MG PO TBEC Oral Take 800 mg by mouth 2 (two) times daily.      Marland Kitchen ONE-DAILY MULTI VITAMINS PO TABS Oral Take 1 tablet by mouth daily.      Marland Kitchen PANTOPRAZOLE SODIUM 40 MG PO TBEC Oral Take 40 mg by mouth daily.      Marland Kitchen POTASSIUM CHLORIDE CRYS CR 20 MEQ PO TBCR Oral Take 1 tablet (20 mEq total) by mouth 2 (two) times daily. 30 tablet 0  . SERTRALINE HCL 100 MG PO TABS Oral Take 100 mg by mouth daily.        BP 91/68  Pulse 55  Temp(Src) 97.5 F (36.4 C) (Oral)  Resp 18  SpO2 99%  LMP 01/29/2011  Physical Exam 1925: Physical examination:  Nursing notes reviewed; Vital signs  and O2 SAT reviewed;  Constitutional: Well developed, Well nourished, Well hydrated, Uncomfortable appearing; Head:  Normocephalic, atraumatic; Eyes: EOMI, PERRL, No scleral icterus; ENMT: Mouth and pharynx normal, Mucous membranes moist; Neck: Supple, Full range of motion, No lymphadenopathy; Cardiovascular: Regular rate and rhythm, No murmur, rub, or gallop; Respiratory: Breath sounds clear & equal bilaterally, No rales, rhonchi, wheezes, or rub, Normal respiratory effort/excursion; Chest: Nontender, Movement normal; Abdomen: Soft, +mild diffuse tenderness to palp, no rebound or guarding.  No specific area of tenderness, Nondistended, Normal bowel sounds; Genitourinary: No CVA tenderness; ; Extremities: Pulses normal, No tenderness, No edema, No calf edema or asymmetry.  Bilat LE's muscle compartments soft.; Neuro: AA&Ox3, Major CN grossly intact.  No gross focal motor or sensory deficits in extremities.; Skin: Color normal, Warm, Dry, no rash.   ED Course  Procedures   MDM  MDM Reviewed: nursing note, vitals and previous chart Reviewed previous: labs Interpretation: labs and CT scan   Results for orders placed during the hospital encounter of 03/12/11  COMPREHENSIVE METABOLIC PANEL      Component Value Range   Sodium 140  135 - 145 (mEq/L)   Potassium 3.1 (*) 3.5 - 5.1 (mEq/L)   Chloride 106  96 - 112 (mEq/L)   CO2 23  19 - 32 (mEq/L)   Glucose, Bld 93  70 - 99 (mg/dL)   BUN 19  6 - 23 (mg/dL)   Creatinine, Ser 1.61  0.50 - 1.10 (mg/dL)   Calcium 9.2  8.4 - 09.6 (mg/dL)   Total Protein 7.1  6.0 - 8.3 (g/dL)   Albumin 3.6  3.5 - 5.2 (g/dL)   AST 10  0 - 37 (U/L)   ALT 8  0 - 35 (U/L)   Alkaline Phosphatase 104  39 - 117 (U/L)   Total Bilirubin 0.1 (*) 0.3 - 1.2 (mg/dL)   GFR calc non Af Amer 84 (*) >90 (mL/min)   GFR calc Af Amer >90  >90 (mL/min)  CBC      Component Value Range   WBC 8.7  4.0 - 10.5 (K/uL)   RBC 3.63 (*) 3.87 - 5.11 (MIL/uL)   Hemoglobin 11.9 (*) 12.0 - 15.0  (g/dL)   HCT 04.5 (*) 40.9 - 46.0 (%)   MCV 97.8  78.0 - 100.0 (fL)   MCH 32.8  26.0 - 34.0 (pg)   MCHC 33.5  30.0 - 36.0 (g/dL)   RDW 81.1  91.4 - 78.2 (%)   Platelets 360  150 - 400 (K/uL)  DIFFERENTIAL  Component Value Range   Neutrophils Relative 54  43 - 77 (%)   Neutro Abs 4.7  1.7 - 7.7 (K/uL)   Lymphocytes Relative 36  12 - 46 (%)   Lymphs Abs 3.1  0.7 - 4.0 (K/uL)   Monocytes Relative 6  3 - 12 (%)   Monocytes Absolute 0.5  0.1 - 1.0 (K/uL)   Eosinophils Relative 3  0 - 5 (%)   Eosinophils Absolute 0.3  0.0 - 0.7 (K/uL)   Basophils Relative 1  0 - 1 (%)   Basophils Absolute 0.1  0.0 - 0.1 (K/uL)  LIPASE, BLOOD      Component Value Range   Lipase 35  11 - 59 (U/L)  URINALYSIS, ROUTINE W REFLEX MICROSCOPIC      Component Value Range   Color, Urine YELLOW  YELLOW    APPearance CLOUDY (*) CLEAR    Specific Gravity, Urine 1.036 (*) 1.005 - 1.030    pH 5.5  5.0 - 8.0    Glucose, UA NEGATIVE  NEGATIVE (mg/dL)   Hgb urine dipstick SMALL (*) NEGATIVE    Bilirubin Urine NEGATIVE  NEGATIVE    Ketones, ur NEGATIVE  NEGATIVE (mg/dL)   Protein, ur NEGATIVE  NEGATIVE (mg/dL)   Urobilinogen, UA 0.2  0.0 - 1.0 (mg/dL)   Nitrite NEGATIVE  NEGATIVE    Leukocytes, UA NEGATIVE  NEGATIVE   POCT PREGNANCY, URINE      Component Value Range   Preg Test, Ur NEGATIVE    URINE MICROSCOPIC-ADD ON      Component Value Range   Squamous Epithelial / LPF RARE  RARE    WBC, UA 0-2  <3 (WBC/hpf)   Bacteria, UA RARE  RARE    Crystals CA OXALATE CRYSTALS (*) NEGATIVE    Urine-Other MUCOUS PRESENT    WET PREP, GENITAL      Component Value Range   Yeast, Wet Prep NONE SEEN  NONE SEEN    Trich, Wet Prep NONE SEEN  NONE SEEN    Clue Cells, Wet Prep NONE SEEN  NONE SEEN    WBC, Wet Prep HPF POC FEW (*) NONE SEEN     Ct Abdomen Pelvis W Contrast  03/12/2011  *RADIOLOGY REPORT*  Clinical Data: Right lower quadrant abdominal pain, nausea, vomiting, and diarrhea.  History of colitis.  CT  ABDOMEN AND PELVIS WITH CONTRAST  Technique:  Multidetector CT imaging of the abdomen and pelvis was performed following the standard protocol during bolus administration of intravenous contrast.  Contrast: OMNIPAQUE IOHEXOL 300 MG/ML IV SOLN  Comparison: 01/30/2011  Findings: Mild fibrosis and dependent changes in the lung bases. Surgical absence of the gallbladder.  Gas within nondistended bile ducts, likely postoperative.  No significant bile duct dilatation. The pancreatic duct is mildly prominent but no pancreatic mass or obstructing lesion is suggested.  No parenchymal atrophy.  Normal spleen size.  No adrenal gland nodules.  Nonobstructing punctate calcifications again demonstrated within both kidneys.  No solid mass or hydronephrosis in either kidney.  Normal caliber abdominal aorta.  No retroperitoneal lymphadenopathy.  The stomach and small bowel are decompressed.  No apparent bowel wall thickening. Contrast material extends to the colon suggesting no evidence of obstruction.  The colon is filled with stool and contrast without distension.  There is diffuse thickening of the wall and folds of the sigmoid colon consistent with nonspecific colitis.  No pericolonic infiltration.  No free fluid or free air in the abdomen.  Pelvis:  The uterus  and adnexal structures are not significantly enlarged.  Surgical clips consistent with tubal ligations.  The bladder wall is not thickened.  No free or loculated pelvic fluid collections.  No significant pelvic lymphadenopathy.  The appendix is normal.  Mild degenerative changes in the lumbar spine with normal alignment. Benign-appearing bone islands in the sacrum.  IMPRESSION: Thickening of the wall and folds of the sigmoid colon suggesting nonspecific colitis.  No pericolonic infiltration.  Again demonstrated are multiple punctate nonobstructing intrarenal stones.  No evidence of bowel obstruction.  Original Report Authenticated By: Marlon Pel, M.D.      12:41 AM:  Has to PO well while in ED, no N/V/D.  Afebrile with normal WBC.  Potassium repleted PO.  Pt insistent "something is wrong" with the right lower side of her abd.  Has told multiple providers in ED two different HPI's: left sided abd pain, then right sided pain.  Concern given hx in EPIC/E-chart review of drug seeking behavior and chronic pain dating back several years.  Is now requesting "more pain and nausea meds" because her "pain and nausea is coming back."  Will dose meds, check wet prep/GC/chlam, and US pelvis.  Pt agreeable with plan, though states "oh, I don't want to be a bother."        0015:  Pt able to undress herself and push herself down the stretcher without distress or difficulty.  Pelvic exam performed with permission of pt and female ED tech assist during exam.  External genitalia w/o lesions. Vaginal vault without discharge.  Cervix w/o lesions, not friable, GC/chlam and wet prep obtained and sent to lab.  Bimanual exam w/o CMT or uterine tenderness to palp.  +right abd/pelvic > left abd/pelvic TTP.  US pelvis pending.   12:41 AM:  Pelvic US pending.  Dispo based on results.  Sign out to Dr. Brooke Dare.      Achilles Neville Allison Quarry, DO 03/13/11 [phone]

## 2011-03-12 NOTE — ED Notes (Signed)
Pt st's she's having abdominal pain in all quadrants x 3 days, st's the pain is 9/10, st's she has colitis.  Bowel sounds normoactive in all 4 quadrants.

## 2011-03-13 ENCOUNTER — Emergency Department (HOSPITAL_COMMUNITY): Payer: 59

## 2011-03-13 LAB — WET PREP, GENITAL
Trich, Wet Prep: NONE SEEN
Yeast Wet Prep HPF POC: NONE SEEN

## 2011-03-13 MED ORDER — SODIUM CHLORIDE 0.9 % IV BOLUS (SEPSIS)
500.0000 mL | Freq: Once | INTRAVENOUS | Status: AC
  Administered 2011-03-13: 500 mL via INTRAVENOUS

## 2011-03-13 MED ORDER — PROMETHAZINE HCL 25 MG PO TABS: 25.0000 mg | ORAL_TABLET | Freq: Four times a day (QID) | ORAL | Status: DC | PRN

## 2011-03-13 MED ORDER — HYDROCODONE-ACETAMINOPHEN 5-325 MG PO TABS: ORAL_TABLET | ORAL | Status: AC

## 2011-03-13 NOTE — ED Notes (Signed)
Rx given to patient.

## 2011-03-16 ENCOUNTER — Emergency Department (HOSPITAL_BASED_OUTPATIENT_CLINIC_OR_DEPARTMENT_OTHER)
Admission: EM | Admit: 2011-03-16 | Discharge: 2011-03-16 | Disposition: A | Discharge status: Home / Self Care | Payer: 59 | Attending: Emergency Medicine | Admitting: Emergency Medicine

## 2011-03-16 ENCOUNTER — Encounter (HOSPITAL_BASED_OUTPATIENT_CLINIC_OR_DEPARTMENT_OTHER): Payer: Self-pay | Admitting: Family Medicine

## 2011-03-16 DIAGNOSIS — F341 Dysthymic disorder: Secondary | ICD-10-CM | POA: Insufficient documentation

## 2011-03-16 DIAGNOSIS — R197 Diarrhea, unspecified: Secondary | ICD-10-CM | POA: Insufficient documentation

## 2011-03-16 DIAGNOSIS — E079 Disorder of thyroid, unspecified: Secondary | ICD-10-CM | POA: Insufficient documentation

## 2011-03-16 DIAGNOSIS — R109 Unspecified abdominal pain: Secondary | ICD-10-CM | POA: Insufficient documentation

## 2011-03-16 LAB — DIFFERENTIAL
Basophils Absolute: 0.1 10*3/uL (ref 0.0–0.1)
Basophils Relative: 0 % (ref 0–1)
Eosinophils Absolute: 0.3 10*3/uL (ref 0.0–0.7)
Eosinophils Relative: 2 % (ref 0–5)
Lymphocytes Relative: 23 % (ref 12–46)

## 2011-03-16 LAB — CBC
MCH: 33.2 pg (ref 26.0–34.0)
MCHC: 33.1 g/dL (ref 30.0–36.0)
MCV: 100.6 fL — ABNORMAL HIGH (ref 78.0–100.0)
Platelets: 315 10*3/uL (ref 150–400)
RDW: 12.8 % (ref 11.5–15.5)
WBC: 11.8 10*3/uL — ABNORMAL HIGH (ref 4.0–10.5)

## 2011-03-16 LAB — COMPREHENSIVE METABOLIC PANEL
ALT: 8 U/L (ref 0–35)
AST: 8 U/L (ref 0–37)
Albumin: 3.9 g/dL (ref 3.5–5.2)
Calcium: 8.7 mg/dL (ref 8.4–10.5)
Sodium: 140 mEq/L (ref 135–145)
Total Protein: 7 g/dL (ref 6.0–8.3)

## 2011-03-16 LAB — URINALYSIS, ROUTINE W REFLEX MICROSCOPIC
Bilirubin Urine: NEGATIVE
Hgb urine dipstick: NEGATIVE
Specific Gravity, Urine: 1.028 (ref 1.005–1.030)
pH: 6 (ref 5.0–8.0)

## 2011-03-16 MED ORDER — PROMETHAZINE HCL 25 MG/ML IJ SOLN
12.5000 mg | Freq: Once | INTRAMUSCULAR | Status: AC
  Administered 2011-03-16: 12.5 mg via INTRAVENOUS
  Filled 2011-03-16: qty 1

## 2011-03-16 MED ORDER — SODIUM CHLORIDE 0.9 % IV BOLUS (SEPSIS)
1000.0000 mL | Freq: Once | INTRAVENOUS | Status: AC
  Administered 2011-03-16: 1000 mL via INTRAVENOUS

## 2011-03-16 NOTE — ED Provider Notes (Signed)
History     CSN: [id number] Arrival date & time: 03/16/2011  2:10 PM   First MD Initiated Contact with Patient 03/16/11 1425      Chief Complaint  Patient presents with  . Diarrhea    (Consider location/radiation/quality/duration/timing/severity/associated sxs/prior treatment) Patient is a 45 y.o. female presenting with abdominal pain. The history is provided by the patient. No language interpreter was used.  Abdominal Pain The primary symptoms of the illness include abdominal pain, nausea and diarrhea. The current episode started more than 2 days ago. The onset of the illness was sudden. The problem has been gradually worsening.  Nausea began more than 1 week ago. The nausea is associated with eating. The nausea is exacerbated by food.  The diarrhea began more than 1 week ago. The diarrhea is watery and malodorous. The diarrhea occurs more than 10 times per day.  The illness is associated with eating. The patient states that she believes she is currently not pregnant. The patient has not had a change in bowel habit. Symptoms associated with the illness do not include chills. Significant associated medical issues include inflammatory bowel disease.  Pt complains of continued diarrhea.  Pt has had multiple evaluations and admissions for the same.  Pt is followed by Dr. Steffanie Dunn in [place] and is scheduled to see him on Friday.  Past Medical History  Diagnosis Date  . Colitis   . WPW (Wolff-Parkinson-White syndrome)   . Kidney calculi   . Kidney stone   . Addison disease   . Thyroid disease   . Clostridium difficile diarrhea   . Anxiety and depression   . Chronic diarrhea   . Chronic back pain   . Drug-seeking behavior   . Irritable bowel syndrome (IBS)   . Anemia     Past Surgical History  Procedure Date  . Cholecystectomy   . Tubal ligation   . Lithotripsy   . Sphincterotomy   . Cardiac electrophysiology mapping and ablation     for WPW    Family History  Problem  Relation Age of Onset  . Colon cancer Neg Hx     History  Substance Use Topics  . Smoking status: Current Everyday Smoker -- 1.0 packs/day for 30 years    Types: Cigarettes  . Smokeless tobacco: Never Used  . Alcohol Use: No    OB History    Grav Para Term Preterm Abortions TAB SAB Ect Mult Living   3 3              Review of Systems  Constitutional: Negative for chills.  Gastrointestinal: Positive for nausea, abdominal pain and diarrhea.  All other systems reviewed and are negative.    Allergies  Zofran  Home Medications   Current Outpatient Rx  Name Route Sig Dispense Refill  . GI COCKTAIL [place] Oral Take 30 mLs by mouth every 4 (four) hours as needed (for chest pain, heartburn). 300 mL 1  . ALIGN PO CAPS Oral Take 1 capsule by mouth daily.      Marland Kitchen CLONAZEPAM 1 MG PO TABS Oral Take 1 mg by mouth 3 (three) times daily as needed. anxiety    . HYDROCODONE-ACETAMINOPHEN 5-325 MG PO TABS  1 or 2 tabs PO q4-6 hours prn pain 20 tablet 0  . HYDROCORTISONE 20 MG PO TABS Oral Take 20 mg by mouth 2 (two) times daily.      Marland Kitchen LEVOTHYROXINE SODIUM 50 MCG PO TABS Oral Take 50 mcg by mouth daily.      Marland Kitchen  MESALAMINE 400 MG PO TBEC Oral Take 800 mg by mouth 2 (two) times daily.      Marland Kitchen ONE-DAILY MULTI VITAMINS PO TABS Oral Take 1 tablet by mouth daily.      Marland Kitchen PANTOPRAZOLE SODIUM 40 MG PO TBEC Oral Take 40 mg by mouth daily.      Marland Kitchen POTASSIUM CHLORIDE CRYS CR 20 MEQ PO TBCR Oral Take 1 tablet (20 mEq total) by mouth 2 (two) times daily. 30 tablet 0  . PROMETHAZINE HCL 25 MG PO TABS Oral Take 1 tablet (25 mg total) by mouth every 6 (six) hours as needed for nausea. 8 tablet 0  . SERTRALINE HCL 100 MG PO TABS Oral Take 100 mg by mouth daily.        BP 118/72  Pulse 116  Temp(Src) 98.1 F (36.7 C) (Oral)  Resp 18  SpO2 100%  LMP 03/06/2011  Physical Exam  Nursing note and vitals reviewed. Constitutional: She appears well-developed and well-nourished.  HENT:  Head:  Normocephalic and atraumatic.  Mouth/Throat: Oropharynx is clear and moist.  Eyes: Conjunctivae are normal. Pupils are equal, round, and reactive to light.  Neck: Normal range of motion.  Cardiovascular: Normal rate and normal heart sounds.   Pulmonary/Chest: Effort normal and breath sounds normal.  Abdominal: Soft. There is no tenderness.  Musculoskeletal: Normal range of motion.  Neurological: She is alert.  Skin: Skin is warm.  Psychiatric: She has a normal mood and affect.    ED Course  Procedures (including critical care time)  Labs Reviewed  CBC - Abnormal; Notable for the following:    WBC 11.8 (*)    RBC 3.55 (*)    Hemoglobin 11.8 (*)    HCT 35.7 (*)    MCV 100.6 (*)    All other components within normal limits  DIFFERENTIAL - Abnormal; Notable for the following:    Neutro Abs 8.4 (*)    All other components within normal limits  COMPREHENSIVE METABOLIC PANEL - Abnormal; Notable for the following:    Glucose, Bld 69 (*)    Total Bilirubin 0.1 (*)    GFR calc non Af Amer 88 (*)    All other components within normal limits  URINALYSIS, ROUTINE W REFLEX MICROSCOPIC - Abnormal; Notable for the following:    APPearance CLOUDY (*)    All other components within normal limits   No results found.   No diagnosis found.    MDM   Results for orders placed during the hospital encounter of 03/16/11  CBC      Component Value Range   WBC 11.8 (*) 4.0 - 10.5 (K/uL)   RBC 3.55 (*) 3.87 - 5.11 (MIL/uL)   Hemoglobin 11.8 (*) 12.0 - 15.0 (g/dL)   HCT 16.1 (*) 09.6 - 46.0 (%)   MCV 100.6 (*) 78.0 - 100.0 (fL)   MCH 33.2  26.0 - 34.0 (pg)   MCHC 33.1  30.0 - 36.0 (g/dL)   RDW 04.5  40.9 - 81.1 (%)   Platelets 315  150 - 400 (K/uL)  DIFFERENTIAL      Component Value Range   Neutrophils Relative 71  43 - 77 (%)   Neutro Abs 8.4 (*) 1.7 - 7.7 (K/uL)   Lymphocytes Relative 23  12 - 46 (%)   Lymphs Abs 2.7  0.7 - 4.0 (K/uL)   Monocytes Relative 4  3 - 12 (%)   Monocytes  Absolute 0.5  0.1 - 1.0 (K/uL)   Eosinophils Relative 2  0 - 5 (%)   Eosinophils Absolute 0.3  0.0 - 0.7 (K/uL)   Basophils Relative 0  0 - 1 (%)   Basophils Absolute 0.1  0.0 - 0.1 (K/uL)  COMPREHENSIVE METABOLIC PANEL      Component Value Range   Sodium 140  135 - 145 (mEq/L)   Potassium 3.5  3.5 - 5.1 (mEq/L)   Chloride 107  96 - 112 (mEq/L)   CO2 22  19 - 32 (mEq/L)   Glucose, Bld 69 (*) 70 - 99 (mg/dL)   BUN 13  6 - 23 (mg/dL)   Creatinine, Ser 1.61  0.50 - 1.10 (mg/dL)   Calcium 8.7  8.4 - 09.6 (mg/dL)   Total Protein 7.0  6.0 - 8.3 (g/dL)   Albumin 3.9  3.5 - 5.2 (g/dL)   AST 8  0 - 37 (U/L)   ALT 8  0 - 35 (U/L)   Alkaline Phosphatase 113  39 - 117 (U/L)   Total Bilirubin 0.1 (*) 0.3 - 1.2 (mg/dL)   GFR calc non Af Amer 88 (*) >90 (mL/min)   GFR calc Af Amer >90  >90 (mL/min)  URINALYSIS, ROUTINE W REFLEX MICROSCOPIC      Component Value Range   Color, Urine YELLOW  YELLOW    APPearance CLOUDY (*) CLEAR    Specific Gravity, Urine 1.028  1.005 - 1.030    pH 6.0  5.0 - 8.0    Glucose, UA NEGATIVE  NEGATIVE (mg/dL)   Hgb urine dipstick NEGATIVE  NEGATIVE    Bilirubin Urine NEGATIVE  NEGATIVE    Ketones, ur NEGATIVE  NEGATIVE (mg/dL)   Protein, ur NEGATIVE  NEGATIVE (mg/dL)   Urobilinogen, UA 0.2  0.0 - 1.0 (mg/dL)   Nitrite NEGATIVE  NEGATIVE    Leukocytes, UA NEGATIVE  NEGATIVE    US Transvaginal Non-ob  03/13/2011  *RADIOLOGY REPORT*  Clinical Data:  Right groin pain.  TRANSABDOMINAL AND TRANSVAGINAL ULTRASOUND OF PELVIS DOPPLER ULTRASOUND OF OVARIES  Technique:  Both transabdominal and transvaginal ultrasound examinations of the pelvis were performed. Transabdominal technique was performed for global imaging of the pelvis including uterus, ovaries, adnexal regions, and pelvic cul-de-sac.  It was necessary to proceed with endovaginal exam following the transabdominal exam to visualize the ovaries and endometrium.  Color and duplex Doppler ultrasound was utilized to  evaluate blood flow to the ovaries.  Comparison:  CT 03/12/2011  Findings:  Uterus:  The uterus measures 10 x 5 x 5.5 cm.  Normal homogeneous myometrial echotexture without focal mass lesion. No free pelvic fluid collections.  Endometrium:  Upper limits of normal endometrial stripe thickness at 1 cm.  Small amount of free fluid in the endometrium.  This is likely physiologic, given history of last menstrual period 1 week ago.  Right ovary: The right ovary measures 3.9 x 2.1 x 3.3 cm.  Normal follicular changes are demonstrated.  Flow is demonstrated within the ovary on color flow Doppler images with spectral Doppler demonstrating arterial and venous flow waveform patterns.  Left ovary:    The left ovary measures 1.9 x 0.9 x 2.3 cm.  Normal follicular changes are demonstrated.  Flow is demonstrated within the ovary on color flow Doppler images with spectral Doppler demonstrating arterial and venous flow waveform patterns.  Normal appearance/no adnexal mass  Pulsed Doppler evaluation demonstrates normal low-resistance arterial and venous waveforms in both ovaries.  IMPRESSION: Normal exam. Trace fluid in the endometrium is probably physiologic. No evidence of pelvic mass or other  significant abnormality.  No sonographic evidence for ovarian torsion.  Original Report Authenticated By: Marlon Pel, M.D.   US Pelvis Complete  03/13/2011  *RADIOLOGY REPORT*  Clinical Data:  Right groin pain.  TRANSABDOMINAL AND TRANSVAGINAL ULTRASOUND OF PELVIS DOPPLER ULTRASOUND OF OVARIES  Technique:  Both transabdominal and transvaginal ultrasound examinations of the pelvis were performed. Transabdominal technique was performed for global imaging of the pelvis including uterus, ovaries, adnexal regions, and pelvic cul-de-sac.  It was necessary to proceed with endovaginal exam following the transabdominal exam to visualize the ovaries and endometrium.  Color and duplex Doppler ultrasound was utilized to evaluate blood flow to  the ovaries.  Comparison:  CT 03/12/2011  Findings:  Uterus:  The uterus measures 10 x 5 x 5.5 cm.  Normal homogeneous myometrial echotexture without focal mass lesion. No free pelvic fluid collections.  Endometrium:  Upper limits of normal endometrial stripe thickness at 1 cm.  Small amount of free fluid in the endometrium.  This is likely physiologic, given history of last menstrual period 1 week ago.  Right ovary: The right ovary measures 3.9 x 2.1 x 3.3 cm.  Normal follicular changes are demonstrated.  Flow is demonstrated within the ovary on color flow Doppler images with spectral Doppler demonstrating arterial and venous flow waveform patterns.  Left ovary:    The left ovary measures 1.9 x 0.9 x 2.3 cm.  Normal follicular changes are demonstrated.  Flow is demonstrated within the ovary on color flow Doppler images with spectral Doppler demonstrating arterial and venous flow waveform patterns.  Normal appearance/no adnexal mass  Pulsed Doppler evaluation demonstrates normal low-resistance arterial and venous waveforms in both ovaries.  IMPRESSION: Normal exam. Trace fluid in the endometrium is probably physiologic. No evidence of pelvic mass or other significant abnormality.  No sonographic evidence for ovarian torsion.  Original Report Authenticated By: Marlon Pel, M.D.   Ct Abdomen Pelvis W Contrast  03/12/2011  *RADIOLOGY REPORT*  Clinical Data: Right lower quadrant abdominal pain, nausea, vomiting, and diarrhea.  History of colitis.  CT ABDOMEN AND PELVIS WITH CONTRAST  Technique:  Multidetector CT imaging of the abdomen and pelvis was performed following the standard protocol during bolus administration of intravenous contrast.  Contrast: OMNIPAQUE IOHEXOL 300 MG/ML IV SOLN  Comparison: 01/30/2011  Findings: Mild fibrosis and dependent changes in the lung bases. Surgical absence of the gallbladder.  Gas within nondistended bile ducts, likely postoperative.  No significant bile duct  dilatation. The pancreatic duct is mildly prominent but no pancreatic mass or obstructing lesion is suggested.  No parenchymal atrophy.  Normal spleen size.  No adrenal gland nodules.  Nonobstructing punctate calcifications again demonstrated within both kidneys.  No solid mass or hydronephrosis in either kidney.  Normal caliber abdominal aorta.  No retroperitoneal lymphadenopathy.  The stomach and small bowel are decompressed.  No apparent bowel wall thickening. Contrast material extends to the colon suggesting no evidence of obstruction.  The colon is filled with stool and contrast without distension.  There is diffuse thickening of the wall and folds of the sigmoid colon consistent with nonspecific colitis.  No pericolonic infiltration.  No free fluid or free air in the abdomen.  Pelvis:  The uterus and adnexal structures are not significantly enlarged.  Surgical clips consistent with tubal ligations.  The bladder wall is not thickened.  No free or loculated pelvic fluid collections.  No significant pelvic lymphadenopathy.  The appendix is normal.  Mild degenerative changes in the lumbar spine with  normal alignment. Benign-appearing bone islands in the sacrum.  IMPRESSION: Thickening of the wall and folds of the sigmoid colon suggesting nonspecific colitis.  No pericolonic infiltration.  Again demonstrated are multiple punctate nonobstructing intrarenal stones.  No evidence of bowel obstruction.  Original Report Authenticated By: Marlon Pel, M.D.   [place] Art/ven Flow Abd Pelv Doppler  03/13/2011  *RADIOLOGY REPORT*  Clinical Data:  Right groin pain.  TRANSABDOMINAL AND TRANSVAGINAL ULTRASOUND OF PELVIS DOPPLER ULTRASOUND OF OVARIES  Technique:  Both transabdominal and transvaginal ultrasound examinations of the pelvis were performed. Transabdominal technique was performed for global imaging of the pelvis including uterus, ovaries, adnexal regions, and pelvic cul-de-sac.  It was necessary to proceed with  endovaginal exam following the transabdominal exam to visualize the ovaries and endometrium.  Color and duplex Doppler ultrasound was utilized to evaluate blood flow to the ovaries.  Comparison:  CT 03/12/2011  Findings:  Uterus:  The uterus measures 10 x 5 x 5.5 cm.  Normal homogeneous myometrial echotexture without focal mass lesion. No free pelvic fluid collections.  Endometrium:  Upper limits of normal endometrial stripe thickness at 1 cm.  Small amount of free fluid in the endometrium.  This is likely physiologic, given history of last menstrual period 1 week ago.  Right ovary: The right ovary measures 3.9 x 2.1 x 3.3 cm.  Normal follicular changes are demonstrated.  Flow is demonstrated within the ovary on color flow Doppler images with spectral Doppler demonstrating arterial and venous flow waveform patterns.  Left ovary:    The left ovary measures 1.9 x 0.9 x 2.3 cm.  Normal follicular changes are demonstrated.  Flow is demonstrated within the ovary on color flow Doppler images with spectral Doppler demonstrating arterial and venous flow waveform patterns.  Normal appearance/no adnexal mass  Pulsed Doppler evaluation demonstrates normal low-resistance arterial and venous waveforms in both ovaries.  IMPRESSION: Normal exam. Trace fluid in the endometrium is probably physiologic. No evidence of pelvic mass or other significant abnormality.  No sonographic evidence for ovarian torsion.  Original Report Authenticated By: Marlon Pel, M.D.          Langston Masker, [place] 03/16/11 1721  Langston Masker, [place] 03/16/11 [phone]

## 2011-03-16 NOTE — ED Notes (Signed)
Chart reviewed and agree with [facility] nurse assessment.

## 2011-03-16 NOTE — ED Provider Notes (Signed)
Medical screening examination/treatment/procedure(s) were performed by non-physician practitioner and as supervising physician I was immediately available for consultation/collaboration.   Loki Wuthrich A. Aeriel Boulay, MD 03/16/11 2255 

## 2011-03-16 NOTE — ED Notes (Signed)
MD at bedside. 

## 2011-03-16 NOTE — ED Notes (Signed)
Pt c/o diarrhea and sts "I think I have c-diff". 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 Pt sts she has appt at [facility] on Friday for same. Pt c/o vomiting and diffuse abdominal pain.

## 2011-03-29 ENCOUNTER — Encounter (HOSPITAL_BASED_OUTPATIENT_CLINIC_OR_DEPARTMENT_OTHER): Payer: Self-pay | Admitting: *Deleted

## 2011-03-29 ENCOUNTER — Emergency Department (HOSPITAL_BASED_OUTPATIENT_CLINIC_OR_DEPARTMENT_OTHER)
Admission: EM | Admit: 2011-03-29 | Discharge: 2011-03-29 | Disposition: A | Discharge status: Home / Self Care | Payer: 59 | Attending: Emergency Medicine | Admitting: Emergency Medicine

## 2011-03-29 DIAGNOSIS — R1110 Vomiting, unspecified: Secondary | ICD-10-CM | POA: Insufficient documentation

## 2011-03-29 DIAGNOSIS — Z79899 Other long term (current) drug therapy: Secondary | ICD-10-CM | POA: Insufficient documentation

## 2011-03-29 DIAGNOSIS — E039 Hypothyroidism, unspecified: Secondary | ICD-10-CM | POA: Insufficient documentation

## 2011-03-29 DIAGNOSIS — R109 Unspecified abdominal pain: Secondary | ICD-10-CM | POA: Insufficient documentation

## 2011-03-29 DIAGNOSIS — K589 Irritable bowel syndrome without diarrhea: Secondary | ICD-10-CM | POA: Insufficient documentation

## 2011-03-29 DIAGNOSIS — R42 Dizziness and giddiness: Secondary | ICD-10-CM | POA: Insufficient documentation

## 2011-03-29 DIAGNOSIS — F341 Dysthymic disorder: Secondary | ICD-10-CM | POA: Insufficient documentation

## 2011-03-29 DIAGNOSIS — F17200 Nicotine dependence, unspecified, uncomplicated: Secondary | ICD-10-CM | POA: Insufficient documentation

## 2011-03-29 LAB — URINALYSIS, ROUTINE W REFLEX MICROSCOPIC
Bilirubin Urine: NEGATIVE
Hgb urine dipstick: NEGATIVE
Nitrite: NEGATIVE
Specific Gravity, Urine: 1.025 (ref 1.005–1.030)
pH: 6 (ref 5.0–8.0)

## 2011-03-29 LAB — BASIC METABOLIC PANEL
BUN: 17 mg/dL (ref 6–23)
Chloride: 107 mEq/L (ref 96–112)
GFR calc Af Amer: >90 mL/min (ref >90)
Glucose, Bld: 103 mg/dL — ABNORMAL HIGH (ref 70–99)
Potassium: 3.5 mEq/L (ref 3.5–5.1)
Sodium: 140 mEq/L (ref 135–145)

## 2011-03-29 LAB — URINE MICROSCOPIC-ADD ON

## 2011-03-29 MED ORDER — PROMETHAZINE HCL 25 MG PO TABS: 25.0000 mg | ORAL_TABLET | Freq: Four times a day (QID) | ORAL | Status: DC | PRN

## 2011-03-29 MED ORDER — MORPHINE SULFATE 4 MG/ML IJ SOLN
4.0000 mg | Freq: Once | INTRAMUSCULAR | Status: AC
  Administered 2011-03-29: 4 mg via INTRAVENOUS
  Filled 2011-03-29: qty 1

## 2011-03-29 MED ORDER — GI COCKTAIL ~~LOC~~
30.0000 mL | Freq: Once | ORAL | Status: AC
  Administered 2011-03-29: 30 mL via ORAL
  Filled 2011-03-29: qty 30

## 2011-03-29 MED ORDER — PROMETHAZINE HCL 25 MG/ML IJ SOLN
12.5000 mg | Freq: Once | INTRAMUSCULAR | Status: AC
  Administered 2011-03-29: 12.5 mg via INTRAVENOUS
  Filled 2011-03-29: qty 1

## 2011-03-29 MED ORDER — SODIUM CHLORIDE 0.9 % IV BOLUS (SEPSIS)
1000.0000 mL | Freq: Once | INTRAVENOUS | Status: AC
  Administered 2011-03-29: 1000 mL via INTRAVENOUS

## 2011-03-29 MED ORDER — HYDROCODONE-ACETAMINOPHEN 5-325 MG PO TABS: 2.0000 | ORAL_TABLET | Freq: Four times a day (QID) | ORAL | Status: AC | PRN

## 2011-03-29 NOTE — ED Notes (Signed)
Pt reports vomiting and dizziness since last night- abdominal pain also- has hx of colitis

## 2011-03-29 NOTE — ED Notes (Addendum)
Pt states that she has pain 8/10. Pt states that she squeezing sensation in her epigastric/lower chest area. Dr Alto [place] notified.

## 2011-03-29 NOTE — ED Notes (Signed)
MD at bedside. 

## 2011-03-29 NOTE — ED Notes (Signed)
Waiting for provider to print script for pain medication at patient request.

## 2011-03-29 NOTE — ED Notes (Signed)
Pt was given sprite to drink per md vo for fluid challenge

## 2011-03-29 NOTE — ED Notes (Signed)
Pt asks questions regarding various medical conditions in her history, pt was advised that she needed to direct those questions to the physician.  Pt was upset that this RN did not answer her questions.  I advised her that this was beyond the scope of my practice and would not speculate to provide her an answer.  Provider notified that pt has unanswered questions.

## 2011-03-30 NOTE — ED Provider Notes (Signed)
History     CSN: [id number]  Arrival date & time 03/29/11  1705   First MD Initiated Contact with Patient 03/29/11 1805      Chief Complaint  Patient presents with  . Dizziness  . Abdominal Pain  . Emesis    (Consider location/radiation/quality/duration/timing/severity/associated sxs/prior treatment) HPI Patient is a 45 yo F with history of colitis.  She presents complaining of 8/10 LUQ pain consistent with her prior episodes but located today on the left instead of on the right where her normal symptoms occur.  She denies blood in her stools.  She endorses diarrhea, nausea, and vomiting.  Patient also has histroy of kidney stones.  She has had numerous CTs this year and does not feel that this is needed today.  She thinks she just needs rehydration and pain and nausea control. Past Medical History  Diagnosis Date  . Colitis   . WPW (Wolff-Parkinson-White syndrome)   . Kidney calculi   . Kidney stone   . Addison disease   . Thyroid disease   . Clostridium difficile diarrhea   . Anxiety and depression   . Chronic diarrhea   . Chronic back pain   . Drug-seeking behavior   . Irritable bowel syndrome (IBS)   . Anemia     Past Surgical History  Procedure Date  . Cholecystectomy   . Tubal ligation   . Lithotripsy   . Sphincterotomy   . Cardiac electrophysiology mapping and ablation     for WPW    Family History  Problem Relation Age of Onset  . Colon cancer Neg Hx     History  Substance Use Topics  . Smoking status: Current Everyday Smoker -- 1.0 packs/day for 30 years    Types: Cigarettes  . Smokeless tobacco: Never Used  . Alcohol Use: No    OB History    Grav Para Term Preterm Abortions TAB SAB Ect Mult Living   3 3              Review of Systems  Constitutional: Negative.   HENT: Negative.   Eyes: Negative.   Respiratory: Negative.   Cardiovascular: Negative.   Gastrointestinal: Positive for nausea, vomiting, abdominal pain and diarrhea.    Genitourinary: Negative.   Musculoskeletal: Negative.   Skin: Negative.   Neurological: Negative.   Hematological: Negative.   Psychiatric/Behavioral: Negative.   All other systems reviewed and are negative.    Allergies  Zofran  Home Medications   Current Outpatient Rx  Name Route Sig Dispense Refill  . ACETAMINOPHEN 500 MG PO TABS Oral Take 1,000 mg by mouth every 6 (six) hours as needed. For fever     . ALIGN PO CAPS Oral Take 1 capsule by mouth daily.      Marland Kitchen CLONAZEPAM 1 MG PO TABS Oral Take 1 mg by mouth 3 (three) times daily as needed. anxiety    . HYDROCORTISONE 20 MG PO TABS Oral Take 20 mg by mouth 2 (two) times daily.      Marland Kitchen LEVOTHYROXINE SODIUM 50 MCG PO TABS Oral Take 50 mcg by mouth daily.      Marland Kitchen MESALAMINE 400 MG PO TBEC Oral Take 800 mg by mouth 2 (two) times daily.      Marland Kitchen ONE-DAILY MULTI VITAMINS PO TABS Oral Take 1 tablet by mouth daily.      Marland Kitchen PANTOPRAZOLE SODIUM 40 MG PO TBEC Oral Take 40 mg by mouth daily.      Marland Kitchen POTASSIUM CHLORIDE CRYS  CR 20 MEQ PO TBCR Oral Take 1 tablet (20 mEq total) by mouth 2 (two) times daily. 30 tablet 0  . SERTRALINE HCL 100 MG PO TABS Oral Take 100 mg by mouth daily.      Marland Kitchen HYDROCODONE-ACETAMINOPHEN 5-325 MG PO TABS Oral Take 2 tablets by mouth every 6 (six) hours as needed for pain. 30 tablet 0  . PROMETHAZINE HCL 25 MG PO TABS Oral Take 1 tablet (25 mg total) by mouth every 6 (six) hours as needed for nausea. 30 tablet 0    BP 99/71  Pulse 65  Temp(Src) 98.5 F (36.9 C) (Oral)  Resp 17  Ht 5\' 4"  (1.626 m)  Wt 130 lb (58.968 kg)  BMI 22.31 kg/m2  SpO2 99%  LMP 03/06/2011  Physical Exam  Nursing note and vitals reviewed. Constitutional: She is oriented to person, place, and time. She appears well-developed and well-nourished. No distress.  HENT:  Head: Normocephalic and atraumatic.  Eyes: Conjunctivae and EOM are normal. Pupils are equal, round, and reactive to light.  Neck: Normal range of motion.  Cardiovascular:  Normal rate, regular rhythm, normal heart sounds and intact distal pulses.  Exam reveals no gallop and no friction rub.   No murmur heard. Pulmonary/Chest: Effort normal and breath sounds normal. No respiratory distress. She has no wheezes. She has no rales.  Abdominal: Soft. Bowel sounds are normal. She exhibits no distension. There is tenderness. There is no rebound and no guarding.       Mild diffuse TTP  Musculoskeletal: Normal range of motion.  Neurological: She is alert and oriented to person, place, and time. No cranial nerve deficit. She exhibits normal muscle tone. Coordination normal.  Skin: Skin is warm and dry. No rash noted.  Psychiatric: She has a normal mood and affect.    ED Course  Procedures (including critical care time)  Labs Reviewed  BASIC METABOLIC PANEL - Abnormal; Notable for the following:    Glucose, Bld 103 (*)    All other components within normal limits  URINALYSIS, ROUTINE W REFLEX MICROSCOPIC - Abnormal; Notable for the following:    APPearance CLOUDY (*)    Ketones, ur 15 (*)    Leukocytes, UA TRACE (*)    All other components within normal limits  URINE MICROSCOPIC-ADD ON - Abnormal; Notable for the following:    Squamous Epithelial / LPF FEW (*)    Bacteria, UA FEW (*)    All other components within normal limits   No results found.   1. Abdominal pain       MDM  Patient had her typical colitis flare today.  She was without bloody stools or acute abdomen.  Renal panel and UA were unremarkable.  She felt better after 2 rounds of meds and was discharged with prescriptions for pain and nausea meds.  She was discharged in good condition and can follow-up with her regular doctors tomorrow.        Cyndra Numbers, MD 03/30/11 1215

## 2011-04-05 ENCOUNTER — Encounter (HOSPITAL_BASED_OUTPATIENT_CLINIC_OR_DEPARTMENT_OTHER): Payer: Self-pay | Admitting: Emergency Medicine

## 2011-04-05 ENCOUNTER — Emergency Department (HOSPITAL_BASED_OUTPATIENT_CLINIC_OR_DEPARTMENT_OTHER)
Admission: EM | Admit: 2011-04-05 | Discharge: 2011-04-05 | Disposition: A | Discharge status: Home / Self Care | Payer: 59 | Attending: Emergency Medicine | Admitting: Emergency Medicine

## 2011-04-05 DIAGNOSIS — R1110 Vomiting, unspecified: Secondary | ICD-10-CM | POA: Insufficient documentation

## 2011-04-05 DIAGNOSIS — K589 Irritable bowel syndrome without diarrhea: Secondary | ICD-10-CM | POA: Insufficient documentation

## 2011-04-05 DIAGNOSIS — R109 Unspecified abdominal pain: Secondary | ICD-10-CM | POA: Insufficient documentation

## 2011-04-05 DIAGNOSIS — G8929 Other chronic pain: Secondary | ICD-10-CM

## 2011-04-05 LAB — CBC
Hemoglobin: 11.7 g/dL — ABNORMAL LOW (ref 12.0–15.0)
MCH: 33.6 pg (ref 26.0–34.0)
MCHC: 32.5 g/dL (ref 30.0–36.0)
Platelets: 232 10*3/uL (ref 150–400)
RDW: 14.4 % (ref 11.5–15.5)

## 2011-04-05 LAB — DIFFERENTIAL
Basophils Absolute: 0 10*3/uL (ref 0.0–0.1)
Basophils Relative: 0 % (ref 0–1)
Eosinophils Absolute: 0.2 10*3/uL (ref 0.0–0.7)
Monocytes Relative: 5 % (ref 3–12)
Neutro Abs: 6.6 10*3/uL (ref 1.7–7.7)
Neutrophils Relative %: 69 % (ref 43–77)

## 2011-04-05 LAB — URINALYSIS, ROUTINE W REFLEX MICROSCOPIC
Bilirubin Urine: NEGATIVE
Glucose, UA: NEGATIVE mg/dL
Ketones, ur: NEGATIVE mg/dL
Nitrite: NEGATIVE
Specific Gravity, Urine: 1.021 (ref 1.005–1.030)
pH: 6.5 (ref 5.0–8.0)

## 2011-04-05 LAB — COMPREHENSIVE METABOLIC PANEL
ALT: 25 U/L (ref 0–35)
AST: 25 U/L (ref 0–37)
Albumin: 4 g/dL (ref 3.5–5.2)
Alkaline Phosphatase: 77 U/L (ref 39–117)
Chloride: 106 mEq/L (ref 96–112)
Potassium: 4.2 mEq/L (ref 3.5–5.1)
Sodium: 141 mEq/L (ref 135–145)
Total Bilirubin: 0.2 mg/dL — ABNORMAL LOW (ref 0.3–1.2)
Total Protein: 6.8 g/dL (ref 6.0–8.3)

## 2011-04-05 LAB — LIPASE, BLOOD: Lipase: 25 U/L (ref 11–59)

## 2011-04-05 MED ORDER — METOCLOPRAMIDE HCL 5 MG/ML IJ SOLN
10.0000 mg | Freq: Once | INTRAMUSCULAR | Status: AC
  Administered 2011-04-05: 10 mg via INTRAVENOUS
  Filled 2011-04-05: qty 2

## 2011-04-05 MED ORDER — KETOROLAC TROMETHAMINE 30 MG/ML IJ SOLN
30.0000 mg | Freq: Once | INTRAMUSCULAR | Status: AC
  Administered 2011-04-05: 30 mg via INTRAVENOUS
  Filled 2011-04-05: qty 1

## 2011-04-05 MED ORDER — SODIUM CHLORIDE 0.9 % IV BOLUS (SEPSIS)
1000.0000 mL | Freq: Once | INTRAVENOUS | Status: AC
  Administered 2011-04-05: 1000 mL via INTRAVENOUS

## 2011-04-05 MED ORDER — HYDROMORPHONE HCL PF 1 MG/ML IJ SOLN
1.0000 mg | Freq: Once | INTRAMUSCULAR | Status: AC
  Administered 2011-04-05: 1 mg via INTRAVENOUS
  Filled 2011-04-05: qty 1

## 2011-04-05 MED ORDER — DICYCLOMINE HCL 10 MG/ML IM SOLN
20.0000 mg | Freq: Once | INTRAMUSCULAR | Status: AC
  Administered 2011-04-05: 20 mg via INTRAMUSCULAR
  Filled 2011-04-05: qty 2

## 2011-04-05 NOTE — ED Provider Notes (Signed)
History     CSN: [id number]  Arrival date & time 04/05/11  1438   First MD Initiated Contact with Patient 04/05/11 1618      Chief Complaint  Patient presents with  . Abdominal Pain  . Nausea  . Emesis    (Consider location/radiation/quality/duration/timing/severity/associated sxs/prior treatment) HPI  H/o lymphocytic colitis, chronic diarrhea, drug seeking behavior, IBS pw abdominal pain. Patient states that she has the diarrhea chronically. She denies seeing blood in her stool. She states that she usually has severe sharp right-sided abdominal pain and occasionally has left-sided abdominal pain. She states that for 4 days she's been having both. She's been taking Tylenol at home without any relief of her symptoms. She states she also took Percocet with no relief in her symptoms. She complains of nausea and multiple episodes of nonbilious nonbloody emesis. She states she is unable to tolerate by mouth at home. Denies vaginal discharge. Denies hematuria/dysuria/freq/urgency. Denies fever, chills. Followed by Dr. Opal Sidles, [place]. Patient is seen here quite frequently with recent CT abd/pelvis c/w colitis, Pelvic U/S unremarkable. H/O c diff colitis, resolved.  Past Medical History  Diagnosis Date  . Colitis   . WPW (Wolff-Parkinson-White syndrome)   . Kidney calculi   . Kidney stone   . Addison disease   . Thyroid disease   . Clostridium difficile diarrhea   . Anxiety and depression   . Chronic diarrhea   . Chronic back pain   . Drug-seeking behavior   . Irritable bowel syndrome (IBS)   . Anemia     Past Surgical History  Procedure Date  . Cholecystectomy   . Tubal ligation   . Lithotripsy   . Sphincterotomy   . Cardiac electrophysiology mapping and ablation     for WPW    Family History  Problem Relation Age of Onset  . Colon cancer Neg Hx     History  Substance Use Topics  . Smoking status: Current Everyday Smoker -- 1.0 packs/day for 30 years    Types:  Cigarettes  . Smokeless tobacco: Never Used  . Alcohol Use: No    OB History    Grav Para Term Preterm Abortions TAB SAB Ect Mult Living   3 3              Review of Systems  All other systems reviewed and are negative.  except as noted HPI  Allergies  Zofran  Home Medications   Current Outpatient Rx  Name Route Sig Dispense Refill  . ACETAMINOPHEN 500 MG PO TABS Oral Take 1,000 mg by mouth every 6 (six) hours as needed. For pain    . ALIGN PO CAPS Oral Take 1 capsule by mouth daily.      . BUDESONIDE ER 3 MG PO CP24 Oral Take 9 mg by mouth every morning.      Marland Kitchen CLONAZEPAM 1 MG PO TABS Oral Take 1 mg by mouth 3 (three) times daily as needed. anxiety    . HYDROCODONE-ACETAMINOPHEN 5-325 MG PO TABS Oral Take 2 tablets by mouth every 6 (six) hours as needed for pain. 30 tablet 0  . HYDROCORTISONE 20 MG PO TABS Oral Take 20 mg by mouth 2 (two) times daily.      Marland Kitchen LEVOTHYROXINE SODIUM 50 MCG PO TABS Oral Take 50 mcg by mouth daily.      . ADULT MULTIVITAMIN W/MINERALS CH Oral Take 1 tablet by mouth daily.      Marland Kitchen PANTOPRAZOLE SODIUM 40 MG PO TBEC Oral Take  40 mg by mouth daily.      Marland Kitchen POTASSIUM CHLORIDE CRYS ER 20 MEQ PO TBCR Oral Take 1 tablet (20 mEq total) by mouth 2 (two) times daily. 30 tablet 0  . PROMETHAZINE HCL 25 MG PO TABS Oral Take 1 tablet (25 mg total) by mouth every 6 (six) hours as needed for nausea. 30 tablet 0  . SERTRALINE HCL 100 MG PO TABS Oral Take 100 mg by mouth daily.        BP 127/62  Pulse 72  Temp(Src) 98.8 F (37.1 C) (Oral)  Resp 18  SpO2 97%  LMP 03/06/2011  Physical Exam  Nursing note and vitals reviewed. Constitutional: She is oriented to person, place, and time. She appears well-developed.  HENT:  Head: Atraumatic.  Mouth/Throat: Oropharynx is clear and moist.  Eyes: Conjunctivae and EOM are normal. Pupils are equal, round, and reactive to light.  Neck: Normal range of motion. Neck supple.  Cardiovascular: Normal rate, regular rhythm,  normal heart sounds and intact distal pulses.   Pulmonary/Chest: Effort normal and breath sounds normal. No respiratory distress. She has no wheezes. She has no rales.  Abdominal: Soft. She exhibits no distension and no mass. There is tenderness. There is no rebound and no guarding.       Diffuse abd ttp No r/g  Musculoskeletal: Normal range of motion.  Neurological: She is alert and oriented to person, place, and time.  Skin: Skin is warm and dry. No rash noted.  Psychiatric: She has a normal mood and affect.    ED Course  Procedures (including critical care time)  Labs Reviewed  CBC - Abnormal; Notable for the following:    RBC 3.48 (*)    Hemoglobin 11.7 (*)    MCV 103.4 (*)    All other components within normal limits  COMPREHENSIVE METABOLIC PANEL - Abnormal; Notable for the following:    Total Bilirubin 0.2 (*)    All other components within normal limits  DIFFERENTIAL  LIPASE, BLOOD  URINALYSIS, ROUTINE W REFLEX MICROSCOPIC   No results found.  1. Chronic abdominal pain    MDM  She presents with her chronic abdominal pain. She seen here frequently for the same. Labs have been reviewed and unremarkable. She's not feeling any relief with Bentyl, Toradol. She received one dose of dilaudid and "my pain is now tolerable and I'm ready to go". She is to follow up as an outpatient with her gastroenterologist, Dr. Opal Sidles of [place]. Precautions for return.        Forbes Cellar, MD 04/05/11 [phone]

## 2011-04-05 NOTE — ED Notes (Signed)
Pt having N/V/D since Saturday. Also abdominal pain.  No known fever.  Pt states similar to previous problems with colitis and pancreatitis.

## 2011-04-15 ENCOUNTER — Emergency Department (HOSPITAL_COMMUNITY)
Admission: EM | Admit: 2011-04-15 | Discharge: 2011-04-15 | Disposition: A | Discharge status: Home / Self Care | Payer: 59 | Attending: Emergency Medicine | Admitting: Emergency Medicine

## 2011-04-15 ENCOUNTER — Emergency Department (HOSPITAL_COMMUNITY): Payer: 59

## 2011-04-15 ENCOUNTER — Encounter (HOSPITAL_COMMUNITY): Payer: Self-pay | Admitting: *Deleted

## 2011-04-15 ENCOUNTER — Other Ambulatory Visit: Payer: Self-pay

## 2011-04-15 DIAGNOSIS — R112 Nausea with vomiting, unspecified: Secondary | ICD-10-CM | POA: Insufficient documentation

## 2011-04-15 DIAGNOSIS — R197 Diarrhea, unspecified: Secondary | ICD-10-CM | POA: Insufficient documentation

## 2011-04-15 DIAGNOSIS — N200 Calculus of kidney: Secondary | ICD-10-CM | POA: Insufficient documentation

## 2011-04-15 DIAGNOSIS — E079 Disorder of thyroid, unspecified: Secondary | ICD-10-CM | POA: Insufficient documentation

## 2011-04-15 DIAGNOSIS — Z79899 Other long term (current) drug therapy: Secondary | ICD-10-CM | POA: Insufficient documentation

## 2011-04-15 DIAGNOSIS — R109 Unspecified abdominal pain: Secondary | ICD-10-CM | POA: Insufficient documentation

## 2011-04-15 DIAGNOSIS — R10819 Abdominal tenderness, unspecified site: Secondary | ICD-10-CM | POA: Insufficient documentation

## 2011-04-15 DIAGNOSIS — Z9089 Acquired absence of other organs: Secondary | ICD-10-CM | POA: Insufficient documentation

## 2011-04-15 LAB — COMPREHENSIVE METABOLIC PANEL
Albumin: 3.9 g/dL (ref 3.5–5.2)
Alkaline Phosphatase: 83 U/L (ref 39–117)
BUN: 10 mg/dL (ref 6–23)
Calcium: 8.5 mg/dL (ref 8.4–10.5)
Potassium: 3.4 mEq/L — ABNORMAL LOW (ref 3.5–5.1)
Sodium: 139 mEq/L (ref 135–145)
Total Protein: 6.5 g/dL (ref 6.0–8.3)

## 2011-04-15 LAB — URINALYSIS, ROUTINE W REFLEX MICROSCOPIC
Bilirubin Urine: NEGATIVE
Nitrite: NEGATIVE
Protein, ur: NEGATIVE mg/dL
Specific Gravity, Urine: 1.011 (ref 1.005–1.030)
Urobilinogen, UA: 0.2 mg/dL (ref 0.0–1.0)

## 2011-04-15 LAB — CBC
HCT: 33.1 % — ABNORMAL LOW (ref 36.0–46.0)
Hemoglobin: 10.9 g/dL — ABNORMAL LOW (ref 12.0–15.0)
MCHC: 32.9 g/dL (ref 30.0–36.0)
RBC: 3.22 MIL/uL — ABNORMAL LOW (ref 3.87–5.11)

## 2011-04-15 LAB — LIPASE, BLOOD: Lipase: 19 U/L (ref 11–59)

## 2011-04-15 LAB — DIFFERENTIAL
Lymphocytes Relative: 25 % (ref 12–46)
Lymphs Abs: 2.3 10*3/uL (ref 0.7–4.0)
Monocytes Absolute: 0.6 10*3/uL (ref 0.1–1.0)
Monocytes Relative: 7 % (ref 3–12)
Neutro Abs: 5.9 10*3/uL (ref 1.7–7.7)
Neutrophils Relative %: 66 % (ref 43–77)

## 2011-04-15 MED ORDER — HYDROMORPHONE HCL PF 2 MG/ML IJ SOLN
2.0000 mg | Freq: Once | INTRAMUSCULAR | Status: AC
  Administered 2011-04-15: 2 mg via INTRAVENOUS
  Filled 2011-04-15: qty 1

## 2011-04-15 MED ORDER — PROMETHAZINE HCL 25 MG PO TABS: 25.0000 mg | ORAL_TABLET | Freq: Four times a day (QID) | ORAL | Status: DC | PRN

## 2011-04-15 MED ORDER — HYDROCODONE-ACETAMINOPHEN 5-325 MG PO TABS: 2.0000 | ORAL_TABLET | ORAL | Status: AC | PRN

## 2011-04-15 MED ORDER — PROMETHAZINE HCL 25 MG/ML IJ SOLN
12.5000 mg | Freq: Once | INTRAMUSCULAR | Status: AC
  Administered 2011-04-15: 12.5 mg via INTRAVENOUS
  Filled 2011-04-15: qty 1

## 2011-04-15 MED ORDER — HYDROMORPHONE HCL PF 1 MG/ML IJ SOLN
1.0000 mg | Freq: Once | INTRAMUSCULAR | Status: AC
  Administered 2011-04-15: 1 mg via INTRAVENOUS
  Filled 2011-04-15: qty 1

## 2011-04-15 MED ORDER — SODIUM CHLORIDE 0.9 % IV BOLUS (SEPSIS)
2000.0000 mL | Freq: Once | INTRAVENOUS | Status: AC
  Administered 2011-04-15: 2000 mL via INTRAVENOUS

## 2011-04-15 MED ORDER — METOCLOPRAMIDE HCL 5 MG/ML IJ SOLN
10.0000 mg | Freq: Once | INTRAMUSCULAR | Status: AC
  Administered 2011-04-15: 10 mg via INTRAVENOUS
  Filled 2011-04-15: qty 2

## 2011-04-15 NOTE — ED Notes (Signed)
Pt able to tolerate ice chips with no nausea. Pt states some indigestion but states that is normal for her.

## 2011-04-15 NOTE — ED Notes (Signed)
Pt given ice chips. Will reevaluate pt after a few minutes

## 2011-04-15 NOTE — ED Notes (Signed)
[id number]> Expected date:<BR> Expected time:<BR> Means of arrival:<BR> Comments:<BR> For triage 4

## 2011-04-15 NOTE — ED Provider Notes (Signed)
History     CSN: [id number]  Arrival date & time 04/15/11  1428   First MD Initiated Contact with Patient 04/15/11 1508      Chief Complaint  Patient presents with  . Abdominal Pain    RLQ pain & RT flank x 2 days. Hx colitis  . Nausea  . Emesis  . Diarrhea    Hx colitis, states diarrhea is different than usual.  HX CDIFF as well    (Consider location/radiation/quality/duration/timing/severity/associated sxs/prior treatment) HPI Complains of right-sided abdominal pain onset 2 days ago typical of pain she's had from colitis in the past no treatment prior to coming here nothing makes symptoms better or worse pain is constant moderate to severe radiates to right flank no fever. Admits to one episode of vomiting today approximately 8 episodes of diarrhea no other associated symptoms Past Medical History  Diagnosis Date  . Colitis   . WPW (Wolff-Parkinson-White syndrome)   . Kidney calculi   . Kidney stone   . Addison disease   . Thyroid disease   . Clostridium difficile diarrhea   . Anxiety and depression   . Chronic diarrhea   . Chronic back pain   . Drug-seeking behavior   . Irritable bowel syndrome (IBS)   . Anemia     Past Surgical History  Procedure Date  . Cholecystectomy   . Tubal ligation   . Lithotripsy   . Sphincterotomy   . Cardiac electrophysiology mapping and ablation     for WPW    Family History  Problem Relation Age of Onset  . Colon cancer Neg Hx     History  Substance Use Topics  . Smoking status: Current Everyday Smoker -- 1.0 packs/day for 30 years    Types: Cigarettes  . Smokeless tobacco: Never Used  . Alcohol Use: No    OB History    Grav Para Term Preterm Abortions TAB SAB Ect Mult Living   3 3              Review of Systems  Constitutional: Negative.   HENT: Negative.   Respiratory: Negative.   Cardiovascular: Negative.   Gastrointestinal: Positive for nausea, vomiting and diarrhea.  Musculoskeletal: Negative.   Skin:  Negative.   Neurological: Negative.   Hematological: Negative.   Psychiatric/Behavioral: Negative.   All other systems reviewed and are negative.    Allergies  Zofran  Home Medications   Current Outpatient Rx  Name Route Sig Dispense Refill  . ACETAMINOPHEN 500 MG PO TABS Oral Take 1,000 mg by mouth every 6 (six) hours as needed. For pain    . BUDESONIDE ER 3 MG PO CP24 Oral Take 9 mg by mouth every morning. Pt takes 3 caps every morning    . CLONAZEPAM 1 MG PO TABS Oral Take 1 mg by mouth 3 (three) times daily as needed. anxiety    . HYDROCORTISONE 20 MG PO TABS Oral Take 20 mg by mouth 2 (two) times daily.      Marland Kitchen LEVOTHYROXINE SODIUM 50 MCG PO TABS Oral Take 50 mcg by mouth daily.      . ADULT MULTIVITAMIN W/MINERALS CH Oral Take 1 tablet by mouth daily.      Marland Kitchen PANTOPRAZOLE SODIUM 40 MG PO TBEC Oral Take 40 mg by mouth daily.      Marland Kitchen POTASSIUM CHLORIDE CRYS ER 20 MEQ PO TBCR Oral Take 1 tablet (20 mEq total) by mouth 2 (two) times daily. 30 tablet 0  . SERTRALINE HCL  100 MG PO TABS Oral Take 100 mg by mouth daily.        BP 130/87  Pulse 81  Temp(Src) 98.4 F (36.9 C) (Oral)  Resp 16  SpO2 100%  LMP 03/09/2011  Physical Exam  Nursing note and vitals reviewed. Constitutional: She appears distressed.       Appears uncomfortable rocking back and forth on the bed  Abdominal: Bowel sounds are normal. She exhibits no mass. There is tenderness. There is no rebound and no guarding.       Tender at right upper and right lower quadrants  Genitourinary:       Right flank tenderness    ED Course  Procedures (including critical care time) 9 PM patient feels much improved her to home she is able to drink water without difficulty.  Labs Reviewed - No data to display No results found.   No diagnosis found.  Results for orders placed during the hospital encounter of 04/15/11  URINALYSIS, ROUTINE W REFLEX MICROSCOPIC      Component Value Range   Color, Urine YELLOW  YELLOW     APPearance CLEAR  CLEAR    Specific Gravity, Urine 1.011  1.005 - 1.030    pH 6.0  5.0 - 8.0    Glucose, UA NEGATIVE  NEGATIVE (mg/dL)   Hgb urine dipstick NEGATIVE  NEGATIVE    Bilirubin Urine NEGATIVE  NEGATIVE    Ketones, ur NEGATIVE  NEGATIVE (mg/dL)   Protein, ur NEGATIVE  NEGATIVE (mg/dL)   Urobilinogen, UA 0.2  0.0 - 1.0 (mg/dL)   Nitrite NEGATIVE  NEGATIVE    Leukocytes, UA NEGATIVE  NEGATIVE   COMPREHENSIVE METABOLIC PANEL      Component Value Range   Sodium 139  135 - 145 (mEq/L)   Potassium 3.4 (*) 3.5 - 5.1 (mEq/L)   Chloride 107  96 - 112 (mEq/L)   CO2 21  19 - 32 (mEq/L)   Glucose, Bld 81  70 - 99 (mg/dL)   BUN 10  6 - 23 (mg/dL)   Creatinine, Ser 1.61  0.50 - 1.10 (mg/dL)   Calcium 8.5  8.4 - 09.6 (mg/dL)   Total Protein 6.5  6.0 - 8.3 (g/dL)   Albumin 3.9  3.5 - 5.2 (g/dL)   AST 18  0 - 37 (U/L)   ALT 19  0 - 35 (U/L)   Alkaline Phosphatase 83  39 - 117 (U/L)   Total Bilirubin 0.2 (*) 0.3 - 1.2 (mg/dL)   GFR calc non Af Amer >90  >90 (mL/min)   GFR calc Af Amer >90  >90 (mL/min)  LIPASE, BLOOD      Component Value Range   Lipase 19  11 - 59 (U/L)  CBC      Component Value Range   WBC 9.0  4.0 - 10.5 (K/uL)   RBC 3.22 (*) 3.87 - 5.11 (MIL/uL)   Hemoglobin 10.9 (*) 12.0 - 15.0 (g/dL)   HCT 04.5 (*) 40.9 - 46.0 (%)   MCV 102.8 (*) 78.0 - 100.0 (fL)   MCH 33.9  26.0 - 34.0 (pg)   MCHC 32.9  30.0 - 36.0 (g/dL)   RDW 81.1  91.4 - 78.2 (%)   Platelets 248  150 - 400 (K/uL)  DIFFERENTIAL      Component Value Range   Neutrophils Relative 66  43 - 77 (%)   Neutro Abs 5.9  1.7 - 7.7 (K/uL)   Lymphocytes Relative 25  12 - 46 (%)  Lymphs Abs 2.3  0.7 - 4.0 (K/uL)   Monocytes Relative 7  3 - 12 (%)   Monocytes Absolute 0.6  0.1 - 1.0 (K/uL)   Eosinophils Relative 3  0 - 5 (%)   Eosinophils Absolute 0.2  0.0 - 0.7 (K/uL)   Basophils Relative 0  0 - 1 (%)   Basophils Absolute 0.0  0.0 - 0.1 (K/uL)  POCT PREGNANCY, URINE      Component Value Range   Preg Test,  Ur NEGATIVE     Dg Abd Acute W/chest  04/15/2011  *RADIOLOGY REPORT*  Clinical Data: Abdominal pain, vomiting.  ACUTE ABDOMEN SERIES (ABDOMEN 2 VIEW & CHEST 1 VIEW)  Comparison: CT 03/12/2011.  Chest x-ray 12/31/2010  Findings: Prior cholecystectomy and bilateral tubal ligation. Small calcifications project over the kidneys bilaterally compatible with kidney stones.  No evidence of bowel obstruction. No free air organomegaly.  Heart and mediastinal contours are within normal limits.  No focal opacities or effusions.  No acute bony abnormality.  IMPRESSION: No obstruction or free air.  Prior cholecystectomy.  Nephrolithiasis.  Original Report Authenticated By: Cyndie Chime, M.D.     MDM  Plan prescription hydrocodone-A. Pap, Phenergan She is to follow up with her primary care at [facility] on 04/17/2010 Diagnosis abdominal pain        Doug Sou, MD 04/15/11 2115

## 2011-04-15 NOTE — ED Notes (Signed)
Pt states pain has decreased to a 7 and states "that is good for me".Denies nausea, states that she is feeling much better

## 2011-05-01 ENCOUNTER — Encounter (HOSPITAL_BASED_OUTPATIENT_CLINIC_OR_DEPARTMENT_OTHER): Payer: Self-pay | Admitting: *Deleted

## 2011-05-01 ENCOUNTER — Emergency Department (INDEPENDENT_AMBULATORY_CARE_PROVIDER_SITE_OTHER): Payer: 59

## 2011-05-01 ENCOUNTER — Emergency Department (HOSPITAL_BASED_OUTPATIENT_CLINIC_OR_DEPARTMENT_OTHER)
Admission: EM | Admit: 2011-05-01 | Discharge: 2011-05-01 | Disposition: A | Discharge status: Home / Self Care | Payer: 59 | Attending: Emergency Medicine | Admitting: Emergency Medicine

## 2011-05-01 ENCOUNTER — Other Ambulatory Visit: Payer: Self-pay

## 2011-05-01 DIAGNOSIS — E079 Disorder of thyroid, unspecified: Secondary | ICD-10-CM | POA: Insufficient documentation

## 2011-05-01 DIAGNOSIS — J40 Bronchitis, not specified as acute or chronic: Secondary | ICD-10-CM

## 2011-05-01 DIAGNOSIS — R0602 Shortness of breath: Secondary | ICD-10-CM | POA: Insufficient documentation

## 2011-05-01 DIAGNOSIS — F341 Dysthymic disorder: Secondary | ICD-10-CM | POA: Insufficient documentation

## 2011-05-01 DIAGNOSIS — Z79899 Other long term (current) drug therapy: Secondary | ICD-10-CM | POA: Insufficient documentation

## 2011-05-01 DIAGNOSIS — R079 Chest pain, unspecified: Secondary | ICD-10-CM | POA: Insufficient documentation

## 2011-05-01 DIAGNOSIS — F17200 Nicotine dependence, unspecified, uncomplicated: Secondary | ICD-10-CM | POA: Insufficient documentation

## 2011-05-01 DIAGNOSIS — G8929 Other chronic pain: Secondary | ICD-10-CM | POA: Insufficient documentation

## 2011-05-01 DIAGNOSIS — K589 Irritable bowel syndrome without diarrhea: Secondary | ICD-10-CM | POA: Insufficient documentation

## 2011-05-01 DIAGNOSIS — R509 Fever, unspecified: Secondary | ICD-10-CM

## 2011-05-01 DIAGNOSIS — R05 Cough: Secondary | ICD-10-CM

## 2011-05-01 MED ORDER — ALBUTEROL SULFATE HFA 108 (90 BASE) MCG/ACT IN AERS: 1.0000 | INHALATION_SPRAY | Freq: Four times a day (QID) | RESPIRATORY_TRACT | Status: DC | PRN

## 2011-05-01 MED ORDER — PROMETHAZINE HCL 25 MG PO TABS
25.0000 mg | ORAL_TABLET | Freq: Once | ORAL | Status: AC
  Administered 2011-05-01: 25 mg via ORAL
  Filled 2011-05-01: qty 1

## 2011-05-01 MED ORDER — PROMETHAZINE HCL 25 MG PO TABS: 25.0000 mg | ORAL_TABLET | Freq: Four times a day (QID) | ORAL | Status: DC | PRN

## 2011-05-01 NOTE — ED Notes (Signed)
Patient sipping ginger ale  

## 2011-05-01 NOTE — ED Notes (Signed)
Reports chest pain x 5 days, worse with cough- feels SOB with walking- vomited x 3 today

## 2011-05-01 NOTE — ED Notes (Signed)
Patient states that her nausea is relieved.

## 2011-05-01 NOTE — ED Provider Notes (Signed)
History     CSN: [id number]  Arrival date & time 05/01/11  1428   First MD Initiated Contact with Patient 05/01/11 1519      Chief Complaint  Patient presents with  . Chest Pain    (Consider location/radiation/quality/duration/timing/severity/associated sxs/prior treatment) Patient is a 46 y.o. female presenting with cough. The history is provided by the patient. No language interpreter was used.  Cough This is a new problem. The current episode started more than 2 days ago. The problem occurs constantly. The cough is productive of sputum. Associated symptoms include shortness of breath. She has tried nothing for the symptoms. The treatment provided no relief. She is a smoker. Her past medical history is significant for pneumonia.  Pt reports she has a history of colitis.  Pt reports antibiotics frequently cause her to ger c diff.  Pt concerned that she could be getting pneumonia.  Pt complains of tightness in her chest.  Past Medical History  Diagnosis Date  . Colitis   . WPW (Wolff-Parkinson-White syndrome)   . Kidney calculi   . Kidney stone   . Addison disease   . Thyroid disease   . Clostridium difficile diarrhea   . Anxiety and depression   . Chronic diarrhea   . Chronic back pain   . Drug-seeking behavior   . Irritable bowel syndrome (IBS)   . Anemia     Past Surgical History  Procedure Date  . Cholecystectomy   . Tubal ligation   . Lithotripsy   . Sphincterotomy   . Cardiac electrophysiology mapping and ablation     for WPW    Family History  Problem Relation Age of Onset  . Colon cancer Neg Hx     History  Substance Use Topics  . Smoking status: Current Everyday Smoker -- 1.0 packs/day for 30 years    Types: Cigarettes  . Smokeless tobacco: Never Used  . Alcohol Use: No    OB History    Grav Para Term Preterm Abortions TAB SAB Ect Mult Living   3 3              Review of Systems  Respiratory: Positive for cough and shortness of breath.     All other systems reviewed and are negative.    Allergies  Zofran  Home Medications   Current Outpatient Rx  Name Route Sig Dispense Refill  . ACETAMINOPHEN 500 MG PO TABS Oral Take 1,000 mg by mouth every 6 (six) hours as needed. For pain    . BUDESONIDE ER 3 MG PO CP24 Oral Take 9 mg by mouth every morning. Pt takes 3 caps every morning    . CLONAZEPAM 1 MG PO TABS Oral Take 1 mg by mouth 3 (three) times daily as needed. anxiety    . HYDROCORTISONE 20 MG PO TABS Oral Take 20 mg by mouth 2 (two) times daily.      Marland Kitchen LEVOTHYROXINE SODIUM 50 MCG PO TABS Oral Take 50 mcg by mouth daily.      . ADULT MULTIVITAMIN W/MINERALS CH Oral Take 1 tablet by mouth daily.      Marland Kitchen PANTOPRAZOLE SODIUM 40 MG PO TBEC Oral Take 40 mg by mouth daily.      Marland Kitchen POTASSIUM CHLORIDE CRYS ER 20 MEQ PO TBCR Oral Take 1 tablet (20 mEq total) by mouth 2 (two) times daily. 30 tablet 0  . SERTRALINE HCL 100 MG PO TABS Oral Take 100 mg by mouth daily.  BP 138/90  Pulse 96  Temp 98.4 F (36.9 C)  Resp 20  SpO2 97%  LMP 04/08/2010  Physical Exam  Nursing note and vitals reviewed. Constitutional: She appears well-developed and well-nourished.  HENT:  Head: Normocephalic and atraumatic.  Right Ear: External ear normal.  Left Ear: External ear normal.  Nose: Nose normal.  Mouth/Throat: Oropharynx is clear and moist.  Eyes: Conjunctivae and EOM are normal. Pupils are equal, round, and reactive to light.  Neck: Normal range of motion.  Cardiovascular: Normal rate and normal heart sounds.   Pulmonary/Chest: Effort normal.  Abdominal: Soft.  Musculoskeletal: Normal range of motion.  Neurological: She is alert.  Skin: Skin is warm.  Psychiatric: She has a normal mood and affect.    ED Course  Procedures (including critical care time)  Labs Reviewed - No data to display Dg Chest 2 View  05/01/2011  *RADIOLOGY REPORT*  Clinical Data: , cough and fever  CHEST - 2 VIEW  Comparison: 12/31/2010  Findings:  The heart size and mediastinal contours are within normal limits.  Both lungs are clear.  The visualized skeletal structures are unremarkable.  IMPRESSION: No active cardiopulmonary abnormalities.  Original Report Authenticated By: Rosealee Albee, M.D.     No diagnosis found.    MDM  Pt advised to follow up with her MD.  Pt given rx for phenergan and inhaler        Langston Masker, [place] 05/01/11 1800

## 2011-05-03 NOTE — ED Provider Notes (Signed)
Medical screening examination/treatment/procedure(s) were performed by non-physician practitioner and as supervising physician I was immediately available for consultation/collaboration.   Forbes Cellar, MD 05/03/11 2311

## 2011-05-16 ENCOUNTER — Emergency Department
Admission: EM | Admit: 2011-05-16 | Discharge: 2011-05-16 | Disposition: A | Discharge status: Home / Self Care | Payer: 59 | Source: Home / Self Care | Attending: Family Medicine | Admitting: Family Medicine

## 2011-05-16 ENCOUNTER — Emergency Department
Admit: 2011-05-16 | Discharge: 2011-05-16 | Disposition: A | Discharge status: Home / Self Care | Payer: 59 | Attending: Family Medicine | Admitting: Family Medicine

## 2011-05-16 ENCOUNTER — Encounter: Payer: Self-pay | Admitting: *Deleted

## 2011-05-16 DIAGNOSIS — M79609 Pain in unspecified limb: Secondary | ICD-10-CM

## 2011-05-16 DIAGNOSIS — M79669 Pain in unspecified lower leg: Secondary | ICD-10-CM

## 2011-05-16 MED ORDER — HYDROCODONE-ACETAMINOPHEN 5-500 MG PO TABS: 1.0000 | ORAL_TABLET | Freq: Every evening | ORAL | Status: DC | PRN

## 2011-05-16 NOTE — ED Provider Notes (Signed)
History     CSN: [id number]  Arrival date & time 05/16/11  0919   First MD Initiated Contact with Patient 05/16/11 1020      Chief Complaint  Patient presents with  . Spasms      HPI Comments: Patient was walking to her barn two days ago when she tripped over a log.  She felt and heard a painful popping sensation in her left posterior calf.  She continues to have pain with walking and weightbearing.  Patient is a 46 y.o. female presenting with leg pain. The history is provided by the patient.  Leg Pain  The incident occurred 2 days ago. The incident occurred at home. The injury mechanism was a fall. Pain location: left calf. The quality of the pain is described as aching. The pain is moderate. The pain has been constant since onset. Associated symptoms include numbness. Pertinent negatives include no inability to bear weight, no loss of motion, no muscle weakness and no loss of sensation. The symptoms are aggravated by activity, bearing weight and palpation. She has tried ice for the symptoms. The treatment provided mild relief.    Past Medical History  Diagnosis Date  . Colitis   . WPW (Wolff-Parkinson-White syndrome)   . Kidney calculi   . Kidney stone   . Addison disease   . Thyroid disease   . Clostridium difficile diarrhea   . Anxiety and depression   . Chronic diarrhea   . Chronic back pain   . Drug-seeking behavior   . Irritable bowel syndrome (IBS)   . Anemia     Past Surgical History  Procedure Date  . Cholecystectomy   . Tubal ligation   . Lithotripsy   . Sphincterotomy   . Cardiac electrophysiology mapping and ablation     for WPW    Family History  Problem Relation Age of Onset  . Colon cancer Neg Hx     History  Substance Use Topics  . Smoking status: Current Everyday Smoker -- 1.0 packs/day for 30 years    Types: Cigarettes  . Smokeless tobacco: Never Used  . Alcohol Use: No    OB History    Grav Para Term Preterm Abortions TAB SAB Ect Mult  Living   3 3              Review of Systems  Neurological: Positive for numbness.  All other systems reviewed and are negative.    Allergies  Nsaids and Zofran  Home Medications   Current Outpatient Rx  Name Route Sig Dispense Refill  . ACETAMINOPHEN 500 MG PO TABS Oral Take 1,000 mg by mouth every 6 (six) hours as needed. For pain    . ALBUTEROL SULFATE HFA 108 (90 BASE) MCG/ACT IN AERS Inhalation Inhale 1-2 puffs into the lungs every 6 (six) hours as needed for wheezing. 1 Inhaler 0  . BUDESONIDE ER 3 MG PO CP24 Oral Take 9 mg by mouth every morning. Pt takes 3 caps every morning    . CLONAZEPAM 1 MG PO TABS Oral Take 1 mg by mouth 3 (three) times daily as needed. anxiety    . HYDROCODONE-ACETAMINOPHEN 5-500 MG PO TABS Oral Take 1 tablet by mouth at bedtime as needed for pain. 12 tablet 0  . HYDROCORTISONE 20 MG PO TABS Oral Take 20 mg by mouth 2 (two) times daily.      Marland Kitchen LEVOTHYROXINE SODIUM 50 MCG PO TABS Oral Take 50 mcg by mouth daily.      Marland Kitchen  ADULT MULTIVITAMIN W/MINERALS CH Oral Take 1 tablet by mouth daily.      Marland Kitchen PANTOPRAZOLE SODIUM 40 MG PO TBEC Oral Take 40 mg by mouth daily.      Marland Kitchen POTASSIUM CHLORIDE CRYS ER 20 MEQ PO TBCR Oral Take 1 tablet (20 mEq total) by mouth 2 (two) times daily. 30 tablet 0  . SERTRALINE HCL 100 MG PO TABS Oral Take 100 mg by mouth daily.        BP 162/98  Pulse 77  Temp(Src) 98.2 F (36.8 C) (Oral)  Resp 16  Ht 5\' 4"  (1.626 m)  Wt 135 lb (61.236 kg)  BMI 23.17 kg/m2  SpO2 98%  LMP 04/08/2010  Physical Exam  Nursing note and vitals reviewed. Constitutional: She appears well-developed and well-nourished. No distress.  Musculoskeletal:       Left lower leg: She exhibits tenderness, bony tenderness and edema. She exhibits no swelling, no deformity and no laceration.       Legs:      Tenderness as noted on diagram:  Both posterior calf and over pre-tibial area.  There is no erythema or warmth.  There is mild edema anteriorly over tibia.   Distal Neurovascular function is intact.       ED Course  Procedures  none   Dg Tibia/fibula Left  05/16/2011  *RADIOLOGY REPORT*  Clinical Data: Injured lower leg 2 days ago with tenderness  LEFT TIBIA AND FIBULA - 2 VIEW  Comparison: None.  Findings: The left tibia and fibula appear intact.  No acute bony abnormality is seen.  No focal demineralization is noted.  No opaque foreign body is seen.  IMPRESSION: Negative.  Original Report Authenticated By: Juline Patch, M.D.     1. Calf pain   Suspect either calf sprain or ruptured plantaris tendon.   MDM  Lortab for pain at bedtime. Begin using crutches for about 5 to 7 days.  Elevate leg.  Apply ice pack several times daily.  Wear below knee support hose daytime (light compression) and take off at night.  Begin range of motion exercises as per instruction sheet in about 5 to 7 days [employer] information and instruction handout given)  Return (or proceed to ER) if develops increasing pain, redness, swelling, etc. Followup with [facility] if not improving about two weeks.         Donna Christen, MD 05/16/11 1124

## 2011-05-16 NOTE — ED Notes (Signed)
Pt c/o LT lower extremity pain x 2 days. She has applied ice/heat and taken tylenol.

## 2011-05-18 DIAGNOSIS — M79609 Pain in unspecified limb: Secondary | ICD-10-CM

## 2011-05-25 ENCOUNTER — Encounter (HOSPITAL_COMMUNITY): Payer: Self-pay | Admitting: *Deleted

## 2011-05-25 ENCOUNTER — Emergency Department (HOSPITAL_COMMUNITY): Payer: 59

## 2011-05-25 ENCOUNTER — Emergency Department (HOSPITAL_COMMUNITY)
Admission: EM | Admit: 2011-05-25 | Discharge: 2011-05-25 | Disposition: A | Discharge status: Home / Self Care | Payer: 59 | Attending: Emergency Medicine | Admitting: Emergency Medicine

## 2011-05-25 DIAGNOSIS — F17200 Nicotine dependence, unspecified, uncomplicated: Secondary | ICD-10-CM | POA: Insufficient documentation

## 2011-05-25 DIAGNOSIS — N949 Unspecified condition associated with female genital organs and menstrual cycle: Secondary | ICD-10-CM | POA: Insufficient documentation

## 2011-05-25 DIAGNOSIS — G8929 Other chronic pain: Secondary | ICD-10-CM | POA: Insufficient documentation

## 2011-05-25 DIAGNOSIS — R109 Unspecified abdominal pain: Secondary | ICD-10-CM | POA: Insufficient documentation

## 2011-05-25 DIAGNOSIS — R10813 Right lower quadrant abdominal tenderness: Secondary | ICD-10-CM | POA: Insufficient documentation

## 2011-05-25 DIAGNOSIS — N898 Other specified noninflammatory disorders of vagina: Secondary | ICD-10-CM | POA: Insufficient documentation

## 2011-05-25 DIAGNOSIS — Z8719 Personal history of other diseases of the digestive system: Secondary | ICD-10-CM | POA: Insufficient documentation

## 2011-05-25 LAB — URINALYSIS, ROUTINE W REFLEX MICROSCOPIC
Glucose, UA: NEGATIVE mg/dL
Hgb urine dipstick: NEGATIVE
Leukocytes, UA: NEGATIVE
Specific Gravity, Urine: 1.027 (ref 1.005–1.030)
pH: 6 (ref 5.0–8.0)

## 2011-05-25 LAB — CBC
HCT: 40.4 % (ref 36.0–46.0)
MCV: 103.6 fL — ABNORMAL HIGH (ref 78.0–100.0)
RBC: 3.9 MIL/uL (ref 3.87–5.11)
RDW: 14 % (ref 11.5–15.5)
WBC: 10.1 10*3/uL (ref 4.0–10.5)

## 2011-05-25 LAB — DIFFERENTIAL
Basophils Absolute: 0 10*3/uL (ref 0.0–0.1)
Eosinophils Relative: 2 % (ref 0–5)
Lymphocytes Relative: 15 % (ref 12–46)
Lymphs Abs: 1.5 10*3/uL (ref 0.7–4.0)
Monocytes Absolute: 0.6 10*3/uL (ref 0.1–1.0)
Neutro Abs: 7.8 10*3/uL — ABNORMAL HIGH (ref 1.7–7.7)

## 2011-05-25 LAB — WET PREP, GENITAL
Trich, Wet Prep: NONE SEEN
WBC, Wet Prep HPF POC: NONE SEEN
Yeast Wet Prep HPF POC: NONE SEEN

## 2011-05-25 LAB — BASIC METABOLIC PANEL
CO2: 23 mEq/L (ref 19–32)
Chloride: 103 mEq/L (ref 96–112)
Creatinine, Ser: 0.78 mg/dL (ref 0.50–1.10)
Glucose, Bld: 93 mg/dL (ref 70–99)
Sodium: 137 mEq/L (ref 135–145)

## 2011-05-25 MED ORDER — METOCLOPRAMIDE HCL 5 MG/ML IJ SOLN
10.0000 mg | Freq: Once | INTRAMUSCULAR | Status: AC
  Administered 2011-05-25: 10 mg via INTRAVENOUS
  Filled 2011-05-25: qty 2

## 2011-05-25 MED ORDER — SODIUM CHLORIDE 0.9 % IV BOLUS (SEPSIS)
1000.0000 mL | Freq: Once | INTRAVENOUS | Status: AC
  Administered 2011-05-25: 1000 mL via INTRAVENOUS

## 2011-05-25 MED ORDER — HYDROMORPHONE HCL PF 1 MG/ML IJ SOLN
1.0000 mg | Freq: Once | INTRAMUSCULAR | Status: AC
  Administered 2011-05-25: 1 mg via INTRAVENOUS
  Filled 2011-05-25: qty 1

## 2011-05-25 MED ORDER — HYDROMORPHONE HCL PF 2 MG/ML IJ SOLN
2.0000 mg | Freq: Once | INTRAMUSCULAR | Status: AC
  Administered 2011-05-25: 2 mg via INTRAVENOUS
  Filled 2011-05-25: qty 1

## 2011-05-25 NOTE — ED Provider Notes (Signed)
History     CSN: [id number]  Arrival date & time 05/25/11  1718   First MD Initiated Contact with Patient 05/25/11 1800      Chief Complaint  Patient presents with  . Abdominal Pain    pt c/o lower abd pain, states has hx of colitis reports diarrhea and vomiting since monday. reports pain in lower pelvic area is different from typical colitis pain.     (Consider location/radiation/quality/duration/timing/severity/associated sxs/prior treatment) HPI  Patient with hx of chronic abdominal pain, chronic vomiting and diarrhea who takes home phenergan, tylenol and imodium on a regular basis presents to ER complaining of chronic abdominal pain n/v/d but states that on Monday, 3 days ago, her normal "crampy all over pain" began to be more so constant RLQ pain that she describes as a "sharp grabbing pain." Patient denies fevers, chills, dysuria, hematuria, blood in stool, vaginal d/c. Patient states she has hx of cholecystectomy and ovarian cysts but states pain feels different than prior hx of ovarian cysts. Denies aggravating or alleviating factors. Symptoms were gradual onset and constant. Patient states her vomiting and diarrhea are at her baseline.   Past Medical History  Diagnosis Date  . Colitis   . WPW (Wolff-Parkinson-White syndrome)   . Kidney calculi   . Kidney stone   . Addison disease   . Thyroid disease   . Clostridium difficile diarrhea   . Anxiety and depression   . Chronic diarrhea   . Chronic back pain   . Drug-seeking behavior   . Irritable bowel syndrome (IBS)   . Anemia     Past Surgical History  Procedure Date  . Cholecystectomy   . Tubal ligation   . Lithotripsy   . Sphincterotomy   . Cardiac electrophysiology mapping and ablation     for WPW    Family History  Problem Relation Age of Onset  . Colon cancer Neg Hx     History  Substance Use Topics  . Smoking status: Current Everyday Smoker -- 1.0 packs/day for 30 years    Types: Cigarettes  .  Smokeless tobacco: Never Used  . Alcohol Use: No    OB History    Grav Para Term Preterm Abortions TAB SAB Ect Mult Living   3 3              Review of Systems  All other systems reviewed and are negative.    Allergies  Nsaids and Zofran  Home Medications   Current Outpatient Rx  Name Route Sig Dispense Refill  . ACETAMINOPHEN 500 MG PO TABS Oral Take 1,000 mg by mouth every 6 (six) hours as needed. For pain    . BUDESONIDE ER 3 MG PO CP24 Oral Take 9 mg by mouth every morning. Pt takes 3 caps every morning    . CLONAZEPAM 1 MG PO TABS Oral Take 1 mg by mouth 3 (three) times daily as needed. anxiety    . HYDROCORTISONE 20 MG PO TABS Oral Take 20 mg by mouth 2 (two) times daily.      Marland Kitchen LEVOTHYROXINE SODIUM 50 MCG PO TABS Oral Take 50 mcg by mouth daily.      . ADULT MULTIVITAMIN W/MINERALS CH Oral Take 1 tablet by mouth daily.      Marland Kitchen POTASSIUM CHLORIDE CRYS ER 20 MEQ PO TBCR Oral Take 1 tablet (20 mEq total) by mouth 2 (two) times daily. 30 tablet 0  . SERTRALINE HCL 100 MG PO TABS Oral Take 100 mg by  mouth daily.        BP 140/89  Pulse 90  Temp(Src) 98.4 F (36.9 C) (Oral)  Resp 20  Wt 135 lb (61.236 kg)  SpO2 98%  LMP 04/08/2010  Physical Exam  Nursing note and vitals reviewed. Constitutional: She is oriented to person, place, and time. She appears well-developed and well-nourished. No distress.  HENT:  Head: Normocephalic and atraumatic.  Eyes: Conjunctivae are normal.  Neck: Normal range of motion. Neck supple.  Cardiovascular: Normal rate, regular rhythm, normal heart sounds and intact distal pulses.  Exam reveals no gallop and no friction rub.   No murmur heard. Pulmonary/Chest: Effort normal and breath sounds normal. No respiratory distress. She has no wheezes. She has no rales. She exhibits no tenderness.  Abdominal: Soft. Bowel sounds are normal. She exhibits no distension and no mass. There is tenderness. There is no rebound and no guarding.       TTP of  RLQ but no rigidity, peritoneal signs or guarding.   Genitourinary: Vagina normal. There is no tenderness or lesion on the right labia. There is no tenderness or lesion on the left labia. Cervix exhibits discharge. Cervix exhibits no motion tenderness. Right adnexum displays tenderness. Right adnexum displays no mass and no fullness.       Thin grey d/c from cervix  Musculoskeletal: Normal range of motion. She exhibits no edema and no tenderness.  Neurological: She is alert and oriented to person, place, and time.  Skin: Skin is warm and dry. No rash noted. She is not diaphoretic. No erythema.  Psychiatric: She has a normal mood and affect.    ED Course  Procedures (including critical care time)  Labs Reviewed  URINALYSIS, ROUTINE W REFLEX MICROSCOPIC - Abnormal; Notable for the following:    APPearance CLOUDY (*)    All other components within normal limits  CBC - Abnormal; Notable for the following:    MCV 103.6 (*)    MCH 34.6 (*)    All other components within normal limits  DIFFERENTIAL - Abnormal; Notable for the following:    Neutrophils Relative 78 (*)    Neutro Abs 7.8 (*)    All other components within normal limits  BASIC METABOLIC PANEL - Abnormal; Notable for the following:    Potassium 3.4 (*)    All other components within normal limits  WET PREP, GENITAL - Abnormal; Notable for the following:    Clue Cells Wet Prep HPF POC FEW (*)    All other components within normal limits  PREGNANCY, URINE  GC/CHLAMYDIA PROBE AMP, GENITAL   No results found.   No diagnosis found.    MDM  Sign out given to [facility]. Afebrile. Non toxic appearing. Right adnexal vs RLQ pain. [place] pending. No peritoneal signs. Recheck after [place] by [employer], [place] 05/25/11 2014

## 2011-05-25 NOTE — Discharge Instructions (Signed)
You were seen and evaluated today for your complaints of abdominal pains. At this time your lab tests and ultrasounds have not shown any signs for concerning or emergent cause your symptoms. You were treated for your pain and reported having improvement of your symptoms. At this time your providers feel you're able to return home with close followup with your primary doctor and GI specialist. It is recommended he be rechecked for your symptoms in the next 24-48 hours. If you develop any worsening symptoms, severe pains, fever, chills, persistent nausea vomiting please return to the emergency room.  Abdominal Pain Abdominal pain can be caused by many things. Your caregiver decides the seriousness of your pain by an examination and possibly blood tests and X-rays. Many cases can be observed and treated at home. Most abdominal pain is not caused by a disease and will probably improve without treatment. However, in many cases, more time must pass before a clear cause of the pain can be found. Before that point, it may not be known if you need more testing, or if hospitalization or surgery is needed. HOME CARE INSTRUCTIONS   Do not take laxatives unless directed by your caregiver.   Take pain medicine only as directed by your caregiver.   Only take over-the-counter or prescription medicines for pain, discomfort, or fever as directed by your caregiver.   Try a clear liquid diet (broth, tea, or water) for as long as directed by your caregiver. Slowly move to a bland diet as tolerated.  SEEK IMMEDIATE MEDICAL CARE IF:   The pain does not go away.   You have a fever.   You keep throwing up (vomiting).   The pain is felt only in portions of the abdomen. Pain in the right side could possibly be appendicitis. In an adult, pain in the left lower portion of the abdomen could be colitis or diverticulitis.   You pass bloody or black tarry stools.  MAKE SURE YOU:   Understand these instructions.   Will  watch your condition.   Will get help right away if you are not doing well or get worse.  Document Released: 12/29/2004 Document Revised: 12/01/2010 Document Reviewed: 11/07/2007 [facility] Patient Information 2012 [place], [place].

## 2011-05-25 NOTE — ED Provider Notes (Signed)
Dawn Frost  8:00PM patient discussed with Drucie Opitz PAC in sign out. Patient with multiple visits to the emergency room for similar complaints of abdominal pains. Pain is similar to prior episodes a slightly more severe. Patient did have tenderness on pelvic exam. Pelvic or sound ordered and pending. Labs unremarkable. Plan to await results and reexamine abdomen. She has had a history of multiple CAT scans.   10:45 PM ultrasound without any concerning findings. Patient now resting comfortably. Reexam of abdomen demonstrates mild right lower quadrant tenderness. There is no significant pains over McBurney'Frost point, no Rovsing sign, no rebound tenderness no guarding or rigidity. Patient has a normal WBC. At this time patient is now is to have CT scan as she has had several in the past. Patient reports she'Frost ready to return home. Strict return precautions have been provided. Patient also reports having an upcoming new appointment with an immunologist and infectious disease specialist at [facility] next week for continued evaluation of her chronic symptoms. Plan to discharge at this time.    Angus Seller, [place] 05/25/11 2253

## 2011-05-25 NOTE — ED Notes (Addendum)
Patient transported to ultra sound with chaperone

## 2011-05-26 LAB — GC/CHLAMYDIA PROBE AMP, GENITAL
Chlamydia, DNA Probe: NEGATIVE
GC Probe Amp, Genital: NEGATIVE

## 2011-05-26 NOTE — ED Provider Notes (Signed)
Medical screening examination/treatment/procedure(s) were performed by non-physician practitioner and as supervising physician I was immediately available for consultation/collaboration. - Please see the original ED note for full documentation.  Gerhard Munch, MD 05/26/11 0002

## 2011-05-26 NOTE — ED Provider Notes (Signed)
Medical screening examination/treatment/procedure(s) were performed by non-physician practitioner and as supervising physician I was immediately available for consultation/collaboration.   Gerhard Munch, MD 05/26/11 0002

## 2011-06-06 ENCOUNTER — Emergency Department (HOSPITAL_BASED_OUTPATIENT_CLINIC_OR_DEPARTMENT_OTHER)
Admission: EM | Admit: 2011-06-06 | Discharge: 2011-06-07 | Disposition: A | Discharge status: Home / Self Care | Payer: 59 | Attending: Emergency Medicine | Admitting: Emergency Medicine

## 2011-06-06 ENCOUNTER — Encounter (HOSPITAL_BASED_OUTPATIENT_CLINIC_OR_DEPARTMENT_OTHER): Payer: Self-pay | Admitting: *Deleted

## 2011-06-06 DIAGNOSIS — F411 Generalized anxiety disorder: Secondary | ICD-10-CM | POA: Insufficient documentation

## 2011-06-06 DIAGNOSIS — Z9889 Other specified postprocedural states: Secondary | ICD-10-CM | POA: Insufficient documentation

## 2011-06-06 DIAGNOSIS — R197 Diarrhea, unspecified: Secondary | ICD-10-CM | POA: Insufficient documentation

## 2011-06-06 DIAGNOSIS — E2749 Other adrenocortical insufficiency: Secondary | ICD-10-CM | POA: Insufficient documentation

## 2011-06-06 DIAGNOSIS — Z9851 Tubal ligation status: Secondary | ICD-10-CM | POA: Insufficient documentation

## 2011-06-06 DIAGNOSIS — Z87442 Personal history of urinary calculi: Secondary | ICD-10-CM | POA: Insufficient documentation

## 2011-06-06 DIAGNOSIS — F17200 Nicotine dependence, unspecified, uncomplicated: Secondary | ICD-10-CM | POA: Insufficient documentation

## 2011-06-06 DIAGNOSIS — R109 Unspecified abdominal pain: Secondary | ICD-10-CM | POA: Insufficient documentation

## 2011-06-06 DIAGNOSIS — M549 Dorsalgia, unspecified: Secondary | ICD-10-CM | POA: Insufficient documentation

## 2011-06-06 DIAGNOSIS — R112 Nausea with vomiting, unspecified: Secondary | ICD-10-CM | POA: Insufficient documentation

## 2011-06-06 DIAGNOSIS — E079 Disorder of thyroid, unspecified: Secondary | ICD-10-CM | POA: Insufficient documentation

## 2011-06-06 DIAGNOSIS — F329 Major depressive disorder, single episode, unspecified: Secondary | ICD-10-CM | POA: Insufficient documentation

## 2011-06-06 DIAGNOSIS — F3289 Other specified depressive episodes: Secondary | ICD-10-CM | POA: Insufficient documentation

## 2011-06-06 DIAGNOSIS — K589 Irritable bowel syndrome without diarrhea: Secondary | ICD-10-CM | POA: Insufficient documentation

## 2011-06-06 DIAGNOSIS — I456 Pre-excitation syndrome: Secondary | ICD-10-CM | POA: Insufficient documentation

## 2011-06-06 LAB — DIFFERENTIAL
Basophils Absolute: 0 10*3/uL (ref 0.0–0.1)
Basophils Relative: 0 % (ref 0–1)
Eosinophils Absolute: 1.1 10*3/uL — ABNORMAL HIGH (ref 0.0–0.7)
Neutro Abs: 7.1 10*3/uL (ref 1.7–7.7)
Neutrophils Relative %: 62 % (ref 43–77)

## 2011-06-06 LAB — CBC
MCH: 35.8 pg — ABNORMAL HIGH (ref 26.0–34.0)
MCHC: 35.5 g/dL (ref 30.0–36.0)
Platelets: 219 10*3/uL (ref 150–400)

## 2011-06-06 NOTE — ED Notes (Signed)
Pt did not arrive to ER with an IV in place. Charted "removed"

## 2011-06-06 NOTE — ED Notes (Signed)
Pt reports abdominal pains with multiple episodes of vomiting and diarrhea. States feels like previous colitis episodes.

## 2011-06-07 ENCOUNTER — Emergency Department (INDEPENDENT_AMBULATORY_CARE_PROVIDER_SITE_OTHER): Payer: 59

## 2011-06-07 DIAGNOSIS — N200 Calculus of kidney: Secondary | ICD-10-CM

## 2011-06-07 DIAGNOSIS — R1110 Vomiting, unspecified: Secondary | ICD-10-CM

## 2011-06-07 DIAGNOSIS — Z87442 Personal history of urinary calculi: Secondary | ICD-10-CM

## 2011-06-07 DIAGNOSIS — R197 Diarrhea, unspecified: Secondary | ICD-10-CM

## 2011-06-07 LAB — URINALYSIS, ROUTINE W REFLEX MICROSCOPIC
Bilirubin Urine: NEGATIVE
Glucose, UA: NEGATIVE mg/dL
Nitrite: NEGATIVE
Specific Gravity, Urine: 1.012 (ref 1.005–1.030)
pH: 6 (ref 5.0–8.0)

## 2011-06-07 LAB — COMPREHENSIVE METABOLIC PANEL
ALT: 12 U/L (ref 0–35)
AST: 17 U/L (ref 0–37)
Albumin: 4.3 g/dL (ref 3.5–5.2)
Alkaline Phosphatase: 80 U/L (ref 39–117)
Potassium: 3.8 mEq/L (ref 3.5–5.1)
Sodium: 138 mEq/L (ref 135–145)
Total Protein: 7.5 g/dL (ref 6.0–8.3)

## 2011-06-07 LAB — LIPASE, BLOOD: Lipase: 26 U/L (ref 11–59)

## 2011-06-07 MED ORDER — SODIUM CHLORIDE 0.9 % IV BOLUS (SEPSIS)
1000.0000 mL | Freq: Once | INTRAVENOUS | Status: AC
  Administered 2011-06-07: 1000 mL via INTRAVENOUS

## 2011-06-07 MED ORDER — PROMETHAZINE HCL 25 MG/ML IJ SOLN
25.0000 mg | Freq: Once | INTRAMUSCULAR | Status: AC
  Administered 2011-06-07: 25 mg via INTRAVENOUS
  Filled 2011-06-07: qty 1

## 2011-06-07 MED ORDER — IOHEXOL 300 MG/ML  SOLN: 20.0000 mL | INTRAMUSCULAR | Status: AC

## 2011-06-07 MED ORDER — FENTANYL CITRATE 0.05 MG/ML IJ SOLN
50.0000 ug | Freq: Once | INTRAMUSCULAR | Status: AC
  Administered 2011-06-07: 50 ug via INTRAVENOUS
  Filled 2011-06-07: qty 2

## 2011-06-07 MED ORDER — PROMETHAZINE HCL 25 MG/ML IJ SOLN
12.5000 mg | Freq: Once | INTRAMUSCULAR | Status: AC
  Administered 2011-06-07: 25 mg via INTRAVENOUS
  Filled 2011-06-07: qty 1

## 2011-06-07 MED ORDER — HYDROCODONE-ACETAMINOPHEN 5-500 MG PO TABS: 1.0000 | ORAL_TABLET | Freq: Four times a day (QID) | ORAL | Status: DC | PRN

## 2011-06-07 MED ORDER — IOHEXOL 300 MG/ML  SOLN
100.0000 mL | Freq: Once | INTRAMUSCULAR | Status: AC | PRN
  Administered 2011-06-07: 100 mL via INTRAVENOUS

## 2011-06-07 MED ORDER — PROMETHAZINE HCL 25 MG RE SUPP: 25.0000 mg | Freq: Four times a day (QID) | RECTAL | Status: DC | PRN

## 2011-06-07 NOTE — ED Provider Notes (Signed)
History  This chart was scribed for Miia Blanks Smitty Cords, MD by Bennett Scrape. This patient was seen in room MH06/MH06 and the patient's care was started at 12:27AM  CSN: [id number]  Arrival date & time 06/06/11  2324   First MD Initiated Contact with Patient 06/07/11 0018      Chief Complaint  Patient presents with  . Abdominal Pain    Patient is a 46 y.o. female presenting with abdominal pain. The history is provided by the patient. No language interpreter was used.  Abdominal Pain The primary symptoms of the illness include abdominal pain, nausea, vomiting and diarrhea. The primary symptoms of the illness do not include fever or shortness of breath. The current episode started 13 to 24 hours ago. The onset of the illness was sudden. The problem has not changed since onset. The abdominal pain began 13 to24 hours ago. The pain came on suddenly. The abdominal pain has been unchanged since its onset. The abdominal pain is located in the RLQ. The abdominal pain does not radiate. The abdominal pain is relieved by nothing. Exacerbated by: nothing.  Symptoms associated with the illness do not include diaphoresis, constipation or frequency. Significant associated medical issues do not include diabetes or cardiac disease.  Pt  h/o colitis who presents to the Emergency Department complaining of 24 hours of gradual onset, gradually worsening, constant, non-radiating abdominal pain with associated emesis and diarrhea. She describes the abdominal pain as being a right sided squeezing sensation. Pt states that she has experienced 4 episodes of non-bloody emesis and at least 2 episodes of diarrhea today. She denies any modifying factors and has not taken any medications PTA to improve symptoms. She has a h/o lymphocytic and microscopic colitis and states that the symptoms she is experiencing now are similar to previous colitis episodes. She denies having sick contacts at home with similar symptoms. She has a  h/o drug seeking behavior. She is a current everyday smoker but denies alcohol use.   Dr. Steffanie Dunn is her GI doctor.    Past Medical History  Diagnosis Date  . Colitis   . WPW (Wolff-Parkinson-White syndrome)   . Kidney calculi   . Kidney stone   . Addison disease   . Thyroid disease   . Clostridium difficile diarrhea   . Anxiety and depression   . Chronic diarrhea   . Chronic back pain   . Drug-seeking behavior   . Irritable bowel syndrome (IBS)   . Anemia     Past Surgical History  Procedure Date  . Cholecystectomy   . Tubal ligation   . Lithotripsy   . Sphincterotomy   . Cardiac electrophysiology mapping and ablation     for WPW    Family History  Problem Relation Age of Onset  . Colon cancer Neg Hx     History  Substance Use Topics  . Smoking status: Current Everyday Smoker -- 1.0 packs/day for 30 years    Types: Cigarettes  . Smokeless tobacco: Never Used  . Alcohol Use: No    OB History    Grav Para Term Preterm Abortions TAB SAB Ect Mult Living   3 3              Review of Systems  Constitutional: Negative for fever and diaphoresis.  HENT: Negative.   Respiratory: Negative for shortness of breath.   Cardiovascular: Negative for chest pain.  Gastrointestinal: Positive for nausea, vomiting, abdominal pain and diarrhea. Negative for constipation.  Genitourinary: Negative for frequency.  Musculoskeletal: Negative.   Neurological: Negative.   Hematological: Negative.   Psychiatric/Behavioral: Negative.     Allergies  Nsaids and Zofran  Home Medications   Current Outpatient Rx  Name Route Sig Dispense Refill  . ACETAMINOPHEN 500 MG PO TABS Oral Take 1,000 mg by mouth every 6 (six) hours as needed. For pain    . BUDESONIDE ER 3 MG PO CP24 Oral Take 9 mg by mouth every morning. Pt takes 3 caps every morning    . CLONAZEPAM 1 MG PO TABS Oral Take 1 mg by mouth 3 (three) times daily as needed. anxiety    . HYDROCORTISONE 20 MG PO TABS Oral Take 20  mg by mouth 2 (two) times daily.      Marland Kitchen LEVOTHYROXINE SODIUM 50 MCG PO TABS Oral Take 50 mcg by mouth daily.      . ADULT MULTIVITAMIN W/MINERALS CH Oral Take 1 tablet by mouth daily.      Marland Kitchen POTASSIUM CHLORIDE CRYS ER 20 MEQ PO TBCR Oral Take 1 tablet (20 mEq total) by mouth 2 (two) times daily. 30 tablet 0  . SERTRALINE HCL 100 MG PO TABS Oral Take 100 mg by mouth daily.        Triage Vitals: BP 135/92  Pulse 108  Temp(Src) 97.7 F (36.5 C) (Oral)  Resp 18  Ht 5\' 4"  (1.626 m)  Wt 133 lb (60.328 kg)  BMI 22.83 kg/m2  SpO2 98%  LMP 05/23/2011  Physical Exam  Nursing note and vitals reviewed. Constitutional: She is oriented to person, place, and time. She appears well-developed and well-nourished.  HENT:  Head: Normocephalic and atraumatic.  Mouth/Throat: Oropharynx is clear and moist.       Moist mucous membranes  Eyes: Conjunctivae and EOM are normal. Pupils are equal, round, and reactive to light.  Neck: Normal range of motion. Neck supple. No tracheal deviation (Trachea is midline) present.  Cardiovascular: Normal rate and regular rhythm.   Pulmonary/Chest: Effort normal and breath sounds normal.  Abdominal: Soft. Bowel sounds are normal. She exhibits no distension and no mass. There is no rebound and no guarding.  Musculoskeletal: Normal range of motion. She exhibits no edema.  Lymphadenopathy:    She has no cervical adenopathy.  Neurological: She is alert and oriented to person, place, and time. She has normal reflexes. No cranial nerve deficit.       2+ DTRs  Skin: Skin is warm and dry.  Psychiatric: She has a normal mood and affect. Her behavior is normal.    ED Course  Procedures (including critical care time)  DIAGNOSTIC STUDIES: Oxygen Saturation is 98% on room air, normal by my interpretation.    COORDINATION OF CARE: 12:35PM-Discussed treatment plan with pt and pt agreed to plan.   Labs Reviewed  CBC - Abnormal; Notable for the following:    WBC 11.6 (*)     MCV 101.0 (*)    MCH 35.8 (*)    All other components within normal limits  DIFFERENTIAL - Abnormal; Notable for the following:    Eosinophils Relative 10 (*)    Eosinophils Absolute 1.1 (*)    All other components within normal limits  COMPREHENSIVE METABOLIC PANEL - Abnormal; Notable for the following:    Total Bilirubin 0.2 (*)    All other components within normal limits  LIPASE, BLOOD   No results found.   No diagnosis found.    MDM  Patient understands the risk of radiation but wants CT scan of abdomen  as she feels this pain is different.  She accepts risk of radiation of CT scan.        I personally performed the services described in this documentation, which was scribed in my presence. The recorded information has been reviewed and considered. \   Minta Fair Smitty Cords, MD 06/07/11 [phone]

## 2011-06-07 NOTE — ED Notes (Signed)
Pt instructed to provide urine specimen. Pt instructed to use bedside commode.

## 2011-06-07 NOTE — Discharge Instructions (Signed)
B.R.A.T. Diet Your doctor has recommended the B.R.A.T. diet for you or your child until the condition improves. This is often used to help control diarrhea and vomiting symptoms. If you or your child can tolerate clear liquids, you may have:  Bananas.   Rice.   Applesauce.   Toast (and other simple starches such as crackers, potatoes, noodles).  Be sure to avoid dairy products, meats, and fatty foods until symptoms are better. Fruit juices such as apple, grape, and prune juice can make diarrhea worse. Avoid these. Continue this diet for 2 days or as instructed by your caregiver. Document Released: 03/21/2005 Document Revised: 03/10/2011 Document Reviewed: 09/07/2006 [employer] Patient Information 2012 [employer]. 

## 2011-06-07 NOTE — ED Notes (Signed)
Pt reports worsening nausea. Will notify EDP

## 2011-06-07 NOTE — ED Notes (Signed)
Report received from Andrea RN. Assuming care of patient at this time. 

## 2011-06-07 NOTE — ED Notes (Signed)
Dr Nicanor Alcon at bedside discussing test results with pt and f/u care.

## 2011-06-07 NOTE — ED Notes (Signed)
Pt returned from CT °

## 2011-06-09 ENCOUNTER — Emergency Department (HOSPITAL_COMMUNITY): Payer: 59

## 2011-06-09 ENCOUNTER — Encounter (HOSPITAL_COMMUNITY): Payer: Self-pay | Admitting: Emergency Medicine

## 2011-06-09 ENCOUNTER — Inpatient Hospital Stay (HOSPITAL_COMMUNITY)
Admission: EM | Admit: 2011-06-09 | Discharge: 2011-06-12 | DRG: 392 | Disposition: A | Discharge status: Home / Self Care | Payer: 59 | Attending: Internal Medicine | Admitting: Internal Medicine

## 2011-06-09 DIAGNOSIS — E872 Acidosis, unspecified: Secondary | ICD-10-CM | POA: Diagnosis present

## 2011-06-09 DIAGNOSIS — Z8719 Personal history of other diseases of the digestive system: Secondary | ICD-10-CM

## 2011-06-09 DIAGNOSIS — E2749 Other adrenocortical insufficiency: Secondary | ICD-10-CM | POA: Diagnosis present

## 2011-06-09 DIAGNOSIS — Z87442 Personal history of urinary calculi: Secondary | ICD-10-CM

## 2011-06-09 DIAGNOSIS — F3289 Other specified depressive episodes: Secondary | ICD-10-CM | POA: Insufficient documentation

## 2011-06-09 DIAGNOSIS — E2740 Unspecified adrenocortical insufficiency: Secondary | ICD-10-CM | POA: Insufficient documentation

## 2011-06-09 DIAGNOSIS — R109 Unspecified abdominal pain: Secondary | ICD-10-CM

## 2011-06-09 DIAGNOSIS — D72829 Elevated white blood cell count, unspecified: Secondary | ICD-10-CM

## 2011-06-09 DIAGNOSIS — I456 Pre-excitation syndrome: Secondary | ICD-10-CM | POA: Diagnosis present

## 2011-06-09 DIAGNOSIS — F17200 Nicotine dependence, unspecified, uncomplicated: Secondary | ICD-10-CM | POA: Diagnosis present

## 2011-06-09 DIAGNOSIS — R000 Tachycardia, unspecified: Secondary | ICD-10-CM | POA: Diagnosis present

## 2011-06-09 DIAGNOSIS — F411 Generalized anxiety disorder: Secondary | ICD-10-CM | POA: Diagnosis present

## 2011-06-09 DIAGNOSIS — E860 Dehydration: Secondary | ICD-10-CM | POA: Diagnosis present

## 2011-06-09 DIAGNOSIS — E039 Hypothyroidism, unspecified: Secondary | ICD-10-CM | POA: Insufficient documentation

## 2011-06-09 DIAGNOSIS — Z720 Tobacco use: Secondary | ICD-10-CM

## 2011-06-09 DIAGNOSIS — R112 Nausea with vomiting, unspecified: Secondary | ICD-10-CM

## 2011-06-09 DIAGNOSIS — R197 Diarrhea, unspecified: Principal | ICD-10-CM

## 2011-06-09 DIAGNOSIS — G4700 Insomnia, unspecified: Secondary | ICD-10-CM

## 2011-06-09 DIAGNOSIS — F329 Major depressive disorder, single episode, unspecified: Secondary | ICD-10-CM | POA: Insufficient documentation

## 2011-06-09 DIAGNOSIS — R1110 Vomiting, unspecified: Secondary | ICD-10-CM

## 2011-06-09 LAB — COMPREHENSIVE METABOLIC PANEL
ALT: 12 U/L (ref 0–35)
AST: 19 U/L (ref 0–37)
Alkaline Phosphatase: 89 U/L (ref 39–117)
CO2: 13 mEq/L — ABNORMAL LOW (ref 19–32)
GFR calc Af Amer: >90 mL/min (ref >90)
Glucose, Bld: 102 mg/dL — ABNORMAL HIGH (ref 70–99)
Potassium: 3.7 mEq/L (ref 3.5–5.1)
Sodium: 138 mEq/L (ref 135–145)
Total Protein: 8.3 g/dL (ref 6.0–8.3)

## 2011-06-09 LAB — URINE MICROSCOPIC-ADD ON

## 2011-06-09 LAB — URINALYSIS, ROUTINE W REFLEX MICROSCOPIC
Glucose, UA: NEGATIVE mg/dL
Specific Gravity, Urine: 1.025 (ref 1.005–1.030)
Urobilinogen, UA: 0.2 mg/dL (ref 0.0–1.0)

## 2011-06-09 LAB — CBC
Hemoglobin: 15.7 g/dL — ABNORMAL HIGH (ref 12.0–15.0)
MCHC: 34.9 g/dL (ref 30.0–36.0)
RBC: 4.38 MIL/uL (ref 3.87–5.11)

## 2011-06-09 MED ORDER — LEVOTHYROXINE SODIUM 50 MCG PO TABS
50.0000 ug | ORAL_TABLET | Freq: Every day | ORAL | Status: DC
  Administered 2011-06-09 – 2011-06-12 (×4): 50 ug via ORAL
  Filled 2011-06-09 (×4): qty 1

## 2011-06-09 MED ORDER — CLONAZEPAM 1 MG PO TABS
1.0000 mg | ORAL_TABLET | Freq: Three times a day (TID) | ORAL | Status: DC | PRN
  Administered 2011-06-09 – 2011-06-12 (×7): 1 mg via ORAL
  Filled 2011-06-09 (×5): qty 1
  Filled 2011-06-09: qty 2
  Filled 2011-06-09: qty 1

## 2011-06-09 MED ORDER — PANTOPRAZOLE SODIUM 40 MG IV SOLR
40.0000 mg | INTRAVENOUS | Status: DC
  Administered 2011-06-09 – 2011-06-11 (×3): 40 mg via INTRAVENOUS
  Filled 2011-06-09 (×4): qty 40

## 2011-06-09 MED ORDER — HYDROMORPHONE HCL PF 1 MG/ML IJ SOLN
1.0000 mg | INTRAMUSCULAR | Status: DC | PRN
  Administered 2011-06-09 – 2011-06-12 (×19): 1 mg via INTRAVENOUS
  Filled 2011-06-09 (×20): qty 1

## 2011-06-09 MED ORDER — ONDANSETRON HCL 4 MG/2ML IJ SOLN
INTRAMUSCULAR | Status: AC
  Filled 2011-06-09: qty 2

## 2011-06-09 MED ORDER — SODIUM CHLORIDE 0.9 % IJ SOLN
3.0000 mL | Freq: Two times a day (BID) | INTRAMUSCULAR | Status: DC
  Administered 2011-06-10: 3 mL via INTRAVENOUS

## 2011-06-09 MED ORDER — ACETAMINOPHEN 650 MG RE SUPP: 650.0000 mg | Freq: Four times a day (QID) | RECTAL | Status: DC | PRN

## 2011-06-09 MED ORDER — ZOLPIDEM TARTRATE 10 MG PO TABS
10.0000 mg | ORAL_TABLET | Freq: Once | ORAL | Status: AC
  Administered 2011-06-09: 10 mg via ORAL
  Filled 2011-06-09: qty 1

## 2011-06-09 MED ORDER — SODIUM CHLORIDE 0.9 % IV SOLN
INTRAVENOUS | Status: DC
  Administered 2011-06-09 – 2011-06-11 (×5): via INTRAVENOUS

## 2011-06-09 MED ORDER — SERTRALINE HCL 100 MG PO TABS
100.0000 mg | ORAL_TABLET | Freq: Every day | ORAL | Status: DC
  Administered 2011-06-09 – 2011-06-12 (×4): 100 mg via ORAL
  Filled 2011-06-09: qty 1
  Filled 2011-06-09: qty 2
  Filled 2011-06-09 (×2): qty 1

## 2011-06-09 MED ORDER — BUDESONIDE 3 MG PO CP24
9.0000 mg | ORAL_CAPSULE | ORAL | Status: DC
  Administered 2011-06-10 – 2011-06-12 (×3): 9 mg via ORAL
  Filled 2011-06-09 (×4): qty 3

## 2011-06-09 MED ORDER — HYDROMORPHONE HCL PF 1 MG/ML IJ SOLN
1.0000 mg | Freq: Once | INTRAMUSCULAR | Status: AC
  Administered 2011-06-09: 1 mg via INTRAVENOUS
  Filled 2011-06-09: qty 1

## 2011-06-09 MED ORDER — SODIUM CHLORIDE 0.9 % IV BOLUS (SEPSIS)
1000.0000 mL | Freq: Once | INTRAVENOUS | Status: AC
  Administered 2011-06-09: 1000 mL via INTRAVENOUS

## 2011-06-09 MED ORDER — PROMETHAZINE HCL 25 MG/ML IJ SOLN
12.5000 mg | Freq: Once | INTRAMUSCULAR | Status: AC
  Administered 2011-06-09: 12.5 mg via INTRAVENOUS
  Filled 2011-06-09: qty 1

## 2011-06-09 MED ORDER — HYDROCORTISONE 20 MG PO TABS
20.0000 mg | ORAL_TABLET | Freq: Two times a day (BID) | ORAL | Status: DC
  Administered 2011-06-09 – 2011-06-12 (×6): 20 mg via ORAL
  Filled 2011-06-09 (×7): qty 1

## 2011-06-09 MED ORDER — ENOXAPARIN SODIUM 30 MG/0.3ML ~~LOC~~ SOLN: 30.0000 mg | SUBCUTANEOUS | Status: DC

## 2011-06-09 MED ORDER — MORPHINE SULFATE 4 MG/ML IJ SOLN
4.0000 mg | Freq: Once | INTRAMUSCULAR | Status: AC
  Administered 2011-06-09: 4 mg via INTRAVENOUS
  Filled 2011-06-09: qty 1

## 2011-06-09 MED ORDER — ACETAMINOPHEN 500 MG PO TABS: 1000.0000 mg | ORAL_TABLET | Freq: Four times a day (QID) | ORAL | Status: DC | PRN

## 2011-06-09 MED ORDER — ACETAMINOPHEN 325 MG PO TABS: 650.0000 mg | ORAL_TABLET | Freq: Four times a day (QID) | ORAL | Status: DC | PRN

## 2011-06-09 MED ORDER — ENOXAPARIN SODIUM 40 MG/0.4ML ~~LOC~~ SOLN
40.0000 mg | SUBCUTANEOUS | Status: DC
  Administered 2011-06-09 – 2011-06-11 (×3): 40 mg via SUBCUTANEOUS
  Filled 2011-06-09 (×4): qty 0.4

## 2011-06-09 MED ORDER — PROMETHAZINE HCL 25 MG/ML IJ SOLN
12.5000 mg | Freq: Four times a day (QID) | INTRAMUSCULAR | Status: DC | PRN
  Administered 2011-06-09: 25 mg via INTRAVENOUS
  Administered 2011-06-10 – 2011-06-12 (×9): 12.5 mg via INTRAVENOUS
  Filled 2011-06-09 (×12): qty 1

## 2011-06-09 NOTE — H&P (Signed)
Patient ID: Dawn Frost MRN: [id number] DOB/AGE: 46/29/1967 46 y.o.   PCP: Dr. Linzie Collin, MD with [facility], Dr Imogene Burn at [facility]  DATE OF ADMISSION: 06/09/2011  CHIEF COMPLAINT: Chief Complaint  Patient presents with  . Nausea  . Emesis  . Diarrhea    HPI: Dawn Frost is an 46yo female with hx of lymphocytic colitis per colonoscopy with [facility], who was recently referred by her PCP to Dr Imogene Burn at [facility] for ongoing "malabsorption" issues, hx of adrenal insufficiency on chronic steroids and esophagitis. Pt presents today with 7 day history of watery diarrhea, vomiting and abdominal cramping. She was seen in the ED on 3/4 and discharged home after dx of viral GE. At that time, abdominal CT was unremarkable. She states that since then, her diarrhea and abdominal cramping have become more intense and still with vomiting. She describes upper, mostly right-sided cramping that is worse than previous colitis flares. No known sick contacts. No recent antibiotic treatment. She denies any fever, chest pain or sob, no melena, hematochezia or hematemesis. She is requesting phenergan as zofran is said to cause headache.   PMH: Past Medical History  Diagnosis Date  . Colitis   . WPW (Wolff-Parkinson-White syndrome)   . Kidney calculi   . Kidney stone   . Addison disease   . Thyroid disease   . Clostridium difficile diarrhea   . Anxiety and depression   . Chronic diarrhea   . Chronic back pain   . Drug-seeking behavior   . Irritable bowel syndrome (IBS)   . Anemia     Past Surgical History  Procedure Date  . Cholecystectomy   . Tubal ligation   . Lithotripsy   . Sphincterotomy   . Cardiac electrophysiology mapping and ablation     for WPW    HOME MEDS: Medication List  As of 06/09/2011  3:44 PM   ASK your doctor about these medications         acetaminophen 500 MG tablet   Commonly known as: TYLENOL   Take 1,000 mg by mouth every 6 (six) hours as needed. For pain     budesonide 3 MG 24 hr capsule   Commonly known as: ENTOCORT EC   Take 9 mg by mouth every morning. Pt takes 3 caps every morning      clonazePAM 1 MG tablet   Commonly known as: KLONOPIN   Take 1 mg by mouth 3 (three) times daily as needed. anxiety      HYDROcodone-acetaminophen 5-500 MG per tablet   Commonly known as: VICODIN   Take 1 tablet by mouth every 6 (six) hours as needed for pain.      hydrocortisone 20 MG tablet   Commonly known as: CORTEF   Take 20 mg by mouth 2 (two) times daily.      levothyroxine 50 MCG tablet   Commonly known as: SYNTHROID, LEVOTHROID   Take 50 mcg by mouth daily.      mulitivitamin with minerals Tabs   Take 1 tablet by mouth daily.      potassium chloride SA 20 MEQ tablet   Commonly known as: K-DUR,KLOR-CON   Take 1 tablet (20 mEq total) by mouth 2 (two) times daily.      promethazine 25 MG suppository   Commonly known as: PHENERGAN   Place 1 suppository (25 mg total) rectally every 6 (six) hours as needed for nausea.      sertraline 100 MG tablet   Commonly known as: ZOLOFT  Take 100 mg by mouth daily.              ALLERGIES: Allergies  Allergen Reactions  . Nsaids Other (See Comments)    Bad fo colitis  . Zofran Other (See Comments)    Headache     SOCIAL HX: Lives with family. Works full-time in [employer]. Smokes 1PPD. Denies etoh or illegal drug use.  FAMILY HX: Mother has "GI issues", but uncertain of diagnoses or any other family hx.  ROS: As stated in hpi, otherwise negative.    PHYSICAL EXAM:  Vital Signs: Temp:  [98.5 F (36.9 C)] 98.5 F (36.9 C) (03/07 1235) Pulse Rate:  [118-131] 131  (03/07 1249) BP: (115-133)/(81-99) 120/95 mmHg (03/07 1249) SpO2:  [98 %-99 %] 99 % (03/07 1235)   General: Awake, alert sitting up in stretcher in nad Head: Normocephalic, atraumatic EENT: mucous membranes dry Neck: supple with no thyromegaly or lymphadenopathy, no JVD or carotid bruits Chest: symmetrical movement,  NT to palpation CV: S1S2 RRR, no m/r/g Resp: CTAB, no w/r/c, no increased wob GI: abdomen soft, ND. Diffusely tender on palpation of upper abdomen R>L without rebound or guarding. No appreciated masses or hsm, BS+ MSK: no obvious deformities Skin: no suspicious rashes or lesions Neuro: MAE x 4 without focal m/s deficit on exam Psych: AOx4, normal mood and affect  LABS/STUDIES: CBC    Component Value Date/Time   WBC 17.9* 06/09/2011 1321   RBC 4.38 06/09/2011 1321   HGB 15.7* 06/09/2011 1321   HCT 45.0 06/09/2011 1321   PLT 239 06/09/2011 1321   MCV 102.7* 06/09/2011 1321   MCH 35.8* 06/09/2011 1321   MCHC 34.9 06/09/2011 1321   RDW 13.5 06/09/2011 1321   LYMPHSABS 2.5 06/06/2011 2342   MONOABS 0.8 06/06/2011 2342   EOSABS 1.1* 06/06/2011 2342   BASOSABS 0.0 06/06/2011 2342    CMP     Component Value Date/Time   NA 138 06/09/2011 1321   K 3.7 06/09/2011 1321   CL 108 06/09/2011 1321   CO2 13* 06/09/2011 1321   GLUCOSE 102* 06/09/2011 1321   BUN 12 06/09/2011 1321   CREATININE 0.73 06/09/2011 1321   CALCIUM 10.1 06/09/2011 1321   PROT 8.3 06/09/2011 1321   ALBUMIN 4.7 06/09/2011 1321   AST 19 06/09/2011 1321   ALT 12 06/09/2011 1321   ALKPHOS 89 06/09/2011 1321   BILITOT 0.3 06/09/2011 1321   GFRNONAA >90 06/09/2011 1321   GFRAA >90 06/09/2011 1321      ASSESSMENT & PLAN  1. N/V/D, abdominal pain: Unclear etiology at this point. Pt with hx of lymphocytic colitis and cdiff colitis she states after cipro and flagyl used during hospitalization. Symptoms ongoing x 1 week. CT scan on 3/5 was unremarkable and do not see any need to repeat at this time. Will admit, supportive care, check stool studies. Will hold off on cipro and flagyl for now given pt's history of cdiff.  2. Metabolic acidosis: secondary to above problem. IVF hydration. Recheck bmet in am.  3. Leukocytosis: secondary to #1 +/- UTI. Will send urine for culture.   4. Dehydration: secondary to persistent v/d x one week. Gentle IVFs  5. Tachycardia: related  to dehydration as has already improved with IVFs. Monitor on telemetry x 24 hours.  6. 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 Monitor.   7. Prophylaxis: SQ lovenox  Cordelia Pen, NP-C [facility]  pgr [phone]

## 2011-06-09 NOTE — ED Notes (Addendum)
EMS reports Nausea, vomiting, diarrhea, and warm to touch for 1 week, seen at [facility] on Monday for same symptoms. Pt reports history of Addisons. Pt reports abdominal pain and diarrhea times 1 week. Dizziness while sitting and standing beginning this am.

## 2011-06-09 NOTE — ED Notes (Signed)
Pt had a bowel movement. But was unable to get sample due to tissue in the bathroom. Unable to get sample

## 2011-06-09 NOTE — ED Notes (Signed)
ZOX:[id number]<AV> Expected date:06/09/11<BR> Expected time:12:19 PM<BR> Means of arrival:Ambulance<BR> Comments:<BR> M65. 46 yo f. Abd pain, sick. IV with Zofran. ST on ekg, b/p 130/p. Ambulatory with assistance. 5 mins

## 2011-06-09 NOTE — ED Provider Notes (Signed)
History     CSN: [id number]  Arrival date & time 06/09/11  1222   First MD Initiated Contact with Patient 06/09/11 1233      Chief Complaint  Patient presents with  . Nausea  . Emesis  . Diarrhea    (Consider location/radiation/quality/duration/timing/severity/associated sxs/prior treatment) HPI  Pt presents to the ED with complaints of diffuse abdomiinal pain, N/V/D. She has a PMH positive for Colitis and C.diff. Patient was seen on Monday, a CT scan was done and she was told she has a noro virus. Since then she has continued to have diffuse abdominal cramping, multiple daily episodes of diarrhea and vomiting. She is starting to feel weak. She was brought in by EMS and given Zofran which helped with the nausea. Upon entering the exam room a foul smell is noted in the air.  Past Medical History  Diagnosis Date  . Colitis   . WPW (Wolff-Parkinson-White syndrome)   . Kidney calculi   . Kidney stone   . Addison disease   . Thyroid disease   . Clostridium difficile diarrhea   . Anxiety and depression   . Chronic diarrhea   . Chronic back pain   . Drug-seeking behavior   . Irritable bowel syndrome (IBS)   . Anemia     Past Surgical History  Procedure Date  . Cholecystectomy   . Tubal ligation   . Lithotripsy   . Sphincterotomy   . Cardiac electrophysiology mapping and ablation     for WPW    Family History  Problem Relation Age of Onset  . Colon cancer Neg Hx     History  Substance Use Topics  . Smoking status: Current Everyday Smoker -- 1.0 packs/day for 30 years    Types: Cigarettes  . Smokeless tobacco: Never Used  . Alcohol Use: No    OB History    Grav Para Term Preterm Abortions TAB SAB Ect Mult Living   3 3              Review of Systems  Unable to perform ROS All other systems reviewed and are negative.    Allergies  Nsaids and Zofran  Home Medications   Current Outpatient Rx  Name Route Sig Dispense Refill  . ACETAMINOPHEN 500 MG PO  TABS Oral Take 1,000 mg by mouth every 6 (six) hours as needed. For pain    . BUDESONIDE ER 3 MG PO CP24 Oral Take 9 mg by mouth every morning. Pt takes 3 caps every morning    . CLONAZEPAM 1 MG PO TABS Oral Take 1 mg by mouth 3 (three) times daily as needed. anxiety    . HYDROCODONE-ACETAMINOPHEN 5-500 MG PO TABS Oral Take 1 tablet by mouth every 6 (six) hours as needed for pain. 10 tablet 0  . HYDROCORTISONE 20 MG PO TABS Oral Take 20 mg by mouth 2 (two) times daily.      Marland Kitchen LEVOTHYROXINE SODIUM 50 MCG PO TABS Oral Take 50 mcg by mouth daily.      . ADULT MULTIVITAMIN W/MINERALS CH Oral Take 1 tablet by mouth daily.      Marland Kitchen POTASSIUM CHLORIDE CRYS ER 20 MEQ PO TBCR Oral Take 1 tablet (20 mEq total) by mouth 2 (two) times daily. 30 tablet 0  . PROMETHAZINE HCL 25 MG RE SUPP Rectal Place 1 suppository (25 mg total) rectally every 6 (six) hours as needed for nausea. 12 each 0  . SERTRALINE HCL 100 MG PO TABS Oral Take  100 mg by mouth daily.        BP 120/95  Pulse 131  Temp(Src) 98.5 F (36.9 C) (Oral)  SpO2 99%  LMP 05/23/2011  Physical Exam  Nursing note and vitals reviewed. Constitutional: She is oriented to person, place, and time. She appears well-developed and well-nourished. No distress (pt uncomfortable and in pain).  HENT:  Head: Normocephalic and atraumatic.  Eyes: Conjunctivae are normal. Pupils are equal, round, and reactive to light.  Neck: Trachea normal, normal range of motion and full passive range of motion without pain. Neck supple.  Cardiovascular: Regular rhythm and normal pulses.  Tachycardia present.   Pulmonary/Chest: Effort normal and breath sounds normal. Chest wall is not dull to percussion. She exhibits no tenderness, no crepitus, no edema, no deformity and no retraction.  Abdominal: Normal appearance. She exhibits no distension. There is tenderness (diffuse). There is no rebound and no guarding.  Musculoskeletal: Normal range of motion.  Neurological: She is  oriented to person, place, and time. She has normal strength.  Skin: Skin is warm, dry and intact. She is not diaphoretic.  Psychiatric: She has a normal mood and affect. Her speech is normal. Cognition and memory are normal.    ED Course  Procedures (including critical care time)  Labs Reviewed  CBC - Abnormal; Notable for the following:    WBC 17.9 (*)    Hemoglobin 15.7 (*)    MCV 102.7 (*)    MCH 35.8 (*)    All other components within normal limits  COMPREHENSIVE METABOLIC PANEL - Abnormal; Notable for the following:    CO2 13 (*)    Glucose, Bld 102 (*)    All other components within normal limits  URINALYSIS, ROUTINE W REFLEX MICROSCOPIC - Abnormal; Notable for the following:    Color, Urine AMBER (*) BIOCHEMICALS MAY BE AFFECTED BY COLOR   APPearance TURBID (*)    Hgb urine dipstick LARGE (*)    Bilirubin Urine SMALL (*)    Ketones, ur TRACE (*)    Protein, ur 30 (*)    Leukocytes, UA SMALL (*)    All other components within normal limits  URINE MICROSCOPIC-ADD ON - Abnormal; Notable for the following:    Squamous Epithelial / LPF FEW (*)    Bacteria, UA MANY (*)    Casts GRANULAR CAST (*)    All other components within normal limits  LIPASE, BLOOD  CLOSTRIDIUM DIFFICILE BY PCR  STOOL CULTURE   Dg Abd Acute W/chest  06/09/2011  *RADIOLOGY REPORT*  Clinical Data: 46 year old female with abdominal pain.  Colitis.  ACUTE ABDOMEN SERIES (ABDOMEN 2 VIEW & CHEST 1 VIEW)  Comparison: CT abdomen pelvis 06/07/2011 and earlier.  Findings: Stable lung volumes. Normal cardiac size and mediastinal contours.  Visualized tracheal air column is within normal limits. The lungs remain clear.  No pneumothorax or pneumoperitoneum.  Air-fluid levels in the transverse colon.  No dilated bowel loops. Tubal ligation clips.  Right upper quadrant surgical clips. No acute osseous abnormality identified.  IMPRESSION: 1. Nonobstructed bowel gas pattern, no free air.  Fluid in the colon as seen on the  recent CT. 2. No acute cardiopulmonary abnormality.  Original Report Authenticated By: Ulla Potash III, M.D.     1. Nausea & vomiting   2. Diarrhea       MDM  Dr. Patrica Duel has evaluated the patient as well. She is very dehydrated and acidotic. The patient had a scan done this past Monday and is ada mit  not to have another scan done. Stool cultures have been ordered as well as a C.Diff PCR.  Unassigned hospitalist is being paged for admission for patient.    Dr. Betti Cruz, [facility]  Team 3, MedSurg, inpatient     Dorthula Matas, [place] 06/09/11 1444

## 2011-06-09 NOTE — H&P (Signed)
Patient's PCP: Dr. Linzie Collin Patient's GI: Dr. Opal Sidles at [facility], Dr. Imogene Burn at [facility]  46 year old Caucasian female with complicated GI issues presented with nausea, vomiting and diarrhea.  Patient has had ongoing issue over the last year.  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 Arlyce Dice in the past.  Was started on prednisone however developed an intolerance with epigastric pain as a result was discontinued.  Patient was referred to [facility] by Dr. Opal Sidles patient's gastroenterologist at [facility].  Patient reports that she was also referred to [facility] immunology for further evaluation.  Over the last week the patient has had persistent nausea, vomiting and diarrhea as a result presented to the emergency department.  Had initially presented 2 days ago had CT of the abdomen and pelvis which.show any significant findings.  Nausea/vomiting/diarrhea and abdominal pain.  Etiology unclear at this time.  Will request GI consultation given patient's complicated course.  Hold off on Cipro and Flagyl given history of C. difficile colitis in the past.  Will send stool for C. difficile colitis and if positive then will start the patient on antibiotics.  Continue supportive care with IV fluids, antibiotics and pain management.  Dehydration continue fluid resuscitation.  Questionable urinary tract infection monitor for now.  Send urine culture.  Patient not endorsing any urinary symptoms.  Metabolic acidosis likely due to diarrhea monitor for now.  Patient seen and examined. Chart reviewed. Agree with Cordelia Pen, NP  Cristal Ford, MD 06/09/2011, 6:28 PM

## 2011-06-10 DIAGNOSIS — E860 Dehydration: Secondary | ICD-10-CM | POA: Diagnosis present

## 2011-06-10 LAB — BASIC METABOLIC PANEL
CO2: 19 mEq/L (ref 19–32)
Calcium: 7.9 mg/dL — ABNORMAL LOW (ref 8.4–10.5)
GFR calc non Af Amer: >90 mL/min (ref >90)
Potassium: 3.6 mEq/L (ref 3.5–5.1)
Sodium: 136 mEq/L (ref 135–145)

## 2011-06-10 LAB — CBC
Platelets: 178 10*3/uL (ref 150–400)
RBC: 3.48 MIL/uL — ABNORMAL LOW (ref 3.87–5.11)
WBC: 9.3 10*3/uL (ref 4.0–10.5)

## 2011-06-10 MED ORDER — NICOTINE 21 MG/24HR TD PT24
21.0000 mg | MEDICATED_PATCH | Freq: Every day | TRANSDERMAL | Status: DC
Start: 2011-06-10 — End: 2011-06-12
  Administered 2011-06-11 (×2): 21 mg via TRANSDERMAL
  Filled 2011-06-10 (×3): qty 1

## 2011-06-10 MED ORDER — OXYCODONE HCL 5 MG PO TABS
5.0000 mg | ORAL_TABLET | Freq: Four times a day (QID) | ORAL | Status: DC | PRN
  Administered 2011-06-10 – 2011-06-11 (×3): 5 mg via ORAL
  Filled 2011-06-10 (×3): qty 1

## 2011-06-10 MED ORDER — ZOLPIDEM TARTRATE 5 MG PO TABS
5.0000 mg | ORAL_TABLET | Freq: Every evening | ORAL | Status: DC | PRN
  Administered 2011-06-10 – 2011-06-11 (×2): 5 mg via ORAL
  Filled 2011-06-10 (×2): qty 1

## 2011-06-10 NOTE — ED Provider Notes (Signed)
Medical screening examination/treatment/procedure(s) were conducted as a shared visit with non-physician practitioner(s) and myself.  I personally evaluated the patient during the encounter   Dawn Frost A. Patrica Duel, MD 06/10/11 1455

## 2011-06-10 NOTE — Progress Notes (Signed)
Subjective: Patient complaining of abdominal pain, but reports improved from yesterday.  Reports having diarrhea.  Objective: Vital signs in last 24 hours: Filed Vitals:   06/09/11 1950 06/09/11 2110 06/10/11 0548 06/10/11 1452  BP: 128/86 132/82 105/63 110/71  Pulse: 82 72 52 64  Temp:  98.3 F (36.8 C) 97.5 F (36.4 C) 98.3 F (36.8 C)  TempSrc:  Oral Oral Oral  Resp: 16 16 16 18   Height:      Weight:   59.6 kg (131 lb 6.3 oz)   SpO2: 100% 99% 100% 100%   Weight change:   Intake/Output Summary (Last 24 hours) at 06/10/11 1646 Last data filed at 06/10/11 1452  Gross per 24 hour  Intake 1855.42 ml  Output      3 ml  Net 1852.42 ml    Physical Exam: General: Awake, Oriented, No acute distress. HEENT: EOMI. Neck: Supple CV: S1 and S2 Lungs: Clear to ascultation bilaterally Abdomen: Soft, Nondistended, +bowel sounds, patient clenches her eyes on palpation of the abdomen, no guarding or rebound tenderness Ext: Good pulses. Trace edema.  Lab Results:  Basename 06/10/11 0400 06/09/11 1321  NA 136 138  K 3.6 3.7  CL 110 108  CO2 19 13*  GLUCOSE 93 102*  BUN 13 12  CREATININE 0.76 0.73  CALCIUM 7.9* 10.1  MG -- --  PHOS -- --    Basename 06/09/11 1321  AST 19  ALT 12  ALKPHOS 89  BILITOT 0.3  PROT 8.3  ALBUMIN 4.7    Basename 06/09/11 1321  LIPASE 19  AMYLASE --    Basename 06/10/11 0400 06/09/11 1321  WBC 9.3 17.9*  NEUTROABS -- --  HGB 12.5 15.7*  HCT 36.3 45.0  MCV 104.3* 102.7*  PLT 178 239   No results found for this basename: CKTOTAL:3,CKMB:3,CKMBINDEX:3,TROPONINI:3 in the last 72 hours No components found with this basename: POCBNP:3 No results found for this basename: DDIMER:2 in the last 72 hours No results found for this basename: HGBA1C:2 in the last 72 hours No results found for this basename: CHOL:2,HDL:2,LDLCALC:2,TRIG:2,CHOLHDL:2,LDLDIRECT:2 in the last 72 hours No results found for this basename:  TSH,T4TOTAL,FREET3,T3FREE,THYROIDAB in the last 72 hours No results found for this basename: VITAMINB12:2,FOLATE:2,FERRITIN:2,TIBC:2,IRON:2,RETICCTPCT:2 in the last 72 hours  Micro Results: Recent Results (from the past 240 hour(s))  CLOSTRIDIUM DIFFICILE BY PCR     Status: Normal   Collection Time   06/09/11  4:57 PM      Component Value Range Status Comment   C difficile by pcr NEGATIVE  NEGATIVE  Final     Studies/Results: Dg Abd Acute W/chest  06/09/2011  *RADIOLOGY REPORT*  Clinical Data: 46 year old female with abdominal pain.  Colitis.  ACUTE ABDOMEN SERIES (ABDOMEN 2 VIEW & CHEST 1 VIEW)  Comparison: CT abdomen pelvis 06/07/2011 and earlier.  Findings: Stable lung volumes. Normal cardiac size and mediastinal contours.  Visualized tracheal air column is within normal limits. The lungs remain clear.  No pneumothorax or pneumoperitoneum.  Air-fluid levels in the transverse colon.  No dilated bowel loops. Tubal ligation clips.  Right upper quadrant surgical clips. No acute osseous abnormality identified.  IMPRESSION: 1. Nonobstructed bowel gas pattern, no free air.  Fluid in the colon as seen on the recent CT. 2. No acute cardiopulmonary abnormality.  Original Report Authenticated By: Harley Hallmark, M.D.    Medications: I have reviewed the patient's current medications. Scheduled Meds:   . budesonide  9 mg Oral [id number]  . enoxaparin (LOVENOX) injection  40  mg Subcutaneous Q24H  . hydrocortisone  20 mg Oral BID WC  . levothyroxine  50 mcg Oral Daily  . ondansetron      . pantoprazole (PROTONIX) IV  40 mg Intravenous Q24H  . sertraline  100 mg Oral Daily  . sodium chloride  3 mL Intravenous Q12H  . zolpidem  10 mg Oral Once  . DISCONTD: enoxaparin  30 mg Subcutaneous Q24H   Continuous Infusions:   . sodium chloride 125 mL/hr at 06/10/11 0613   PRN Meds:.acetaminophen, acetaminophen, acetaminophen, clonazePAM, HYDROmorphone, promethazine  Assessment/Plan: 1. Nausea, vomiting,  diarrhea and abdominal pain: Unclear etiology at this point. Pt with hx of microscopic colitis and cdiff colitis.  C. difficile PCR negative.  Stool culture pending.  Appreciate GI input.  Continue hydrocortisone.  CT scan on 3/5 was unremarkable.  GI input appreciated.  GI questioned whether the patient's bilateral nephrolithiasis could be causing the pain, discussed this possibility with the patient and also offered urology evaluation.  Patient declined at this time. Patient reports tolerating clear liquids, advance to full liquids.  Continue supportive care with IV fluids.  2. Metabolic acidosis: secondary to above problem.  Resolved.  3. Leukocytosis: Resolved.    4. Dehydration: Resolved.  Continue gentle hydration.  5. Tachycardia: Resolved.  Likely due to dehydration.    6. 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 Monitor.   7. Prophylaxis: SQ lovenox  8.  Disposition.  Pending.  As the patient's abdominal pain improves and if patient is able to tolerate oral intake could consider discharging the patient.   LOS: 1 day  Elery Cadenhead A, MD 06/10/2011, 4:46 PM

## 2011-06-10 NOTE — Consult Note (Signed)
[facility] Consult Note  Referring Provider: No ref. provider found Primary Care Physician:  Juan Quam, MD, MD Primary Gastroenterologist:  Dr.  Antony Contras Complaint: Nausea vomiting and abdominal pain HPI: Dawn Frost is an 46 y.o. white female.  Admitted yesterday after a one-week syndrome of nausea vomiting diffuse abdominal cramping and persistent diarrhea. She went to be an urgency room at [place] on March 5 and had a CT scan which was unrevealing except for a lot of fluid in the colon. She states that she has had yellowish watery diarrhea. This is superimposed on chronic multiple GI symptoms of been worked up extensively by numerous gastroenterologists both in [place] at [place] and it [facility]. She states she was once diagnosed with Crohn's disease but apparently this is a remote diagnosis and there has not been strong evidence of this recently. However she has been diagnosed with lymphocytic colitis by sigmoidoscopy in November done by Dr. Arlyce Dice. She has also been seen at [facility] recently and is also seemed other endocrinologist for adrenal insufficiency and hypothyroidism. She also has a history of kidney stones and has a history of C. difficile colitis. On her current admission she had a urinalysis which showed small amount of oxalate crystals but no blood. Her CT scan did not strongly suggest an obstructing kidney stone. She states that her generalized abdominal cramping as well as her nausea and vomiting have improved since admission but her diarrhea is built persists and she has developed more right sided abdominal pain and flank pain more consistent with previous kidney stone attacks.   Past Medical History  Diagnosis Date  . Colitis   . WPW (Wolff-Parkinson-White syndrome)   . Kidney calculi   . Kidney stone   . Addison disease   . Thyroid disease   . Clostridium difficile diarrhea   . Anxiety and depression   . Chronic diarrhea   . Chronic  back pain   . Drug-seeking behavior   . Irritable bowel syndrome (IBS)   . Anemia     Past Surgical History  Procedure Date  . Cholecystectomy   . Tubal ligation   . Lithotripsy   . Sphincterotomy   . Cardiac electrophysiology mapping and ablation     for WPW    Medications Prior to Admission  Medication Dose Route Frequency Provider Last Rate Last Dose  . 0.9 %  sodium chloride infusion   Intravenous Continuous Cordelia Pen, NP 125 mL/hr at 06/10/11 [phone]    . acetaminophen (TYLENOL) tablet 650 mg  650 mg Oral Q6H PRN Cordelia Pen, NP       Or  . acetaminophen (TYLENOL) suppository 650 mg  650 mg Rectal Q6H PRN Cordelia Pen, NP      . acetaminophen (TYLENOL) tablet 1,000 mg  1,000 mg Oral Q6H PRN Cordelia Pen, NP      . budesonide (ENTOCORT EC) 24 hr capsule 9 mg  9 mg Oral Ricky Ala, NP   9 mg at 06/10/11 1025  . clonazePAM (KLONOPIN) tablet 1 mg  1 mg Oral TID PRN Cordelia Pen, NP   1 mg at 06/10/11 1024  . enoxaparin (LOVENOX) injection 40 mg  40 mg Subcutaneous Q24H Cristal Ford, MD   40 mg at 06/09/11 2013  . hydrocortisone (CORTEF) tablet 20 mg  20 mg Oral BID WC Cordelia Pen, NP   20 mg at 06/10/11 1024  . HYDROmorphone (DILAUDID) injection 1 mg  1 mg Intravenous Once Dorthula Matas, PA  1 mg at 06/09/11 1532  . HYDROmorphone (DILAUDID) injection 1 mg  1 mg Intravenous Q3H PRN Cordelia Pen, NP   1 mg at 06/10/11 1252  . levothyroxine (SYNTHROID, LEVOTHROID) tablet 50 mcg  50 mcg Oral Daily Cordelia Pen, NP   50 mcg at 06/10/11 1024  . morphine 4 MG/ML injection 4 mg  4 mg Intravenous Once Dorthula Matas, PA   4 mg at 06/09/11 1301  . morphine 4 MG/ML injection 4 mg  4 mg Intravenous Once Dorthula Matas, PA   4 mg at 06/09/11 1414  . ondansetron (ZOFRAN) 4 MG/2ML injection           . pantoprazole (PROTONIX) injection 40 mg  40 mg Intravenous Q24H Cordelia Pen, NP   40 mg at 06/09/11 1756  . promethazine  (PHENERGAN) injection 12.5 mg  12.5 mg Intravenous Once Dorthula Matas, PA   12.5 mg at 06/09/11 1411  . promethazine (PHENERGAN) injection 12.5 mg  12.5 mg Intravenous Q6H PRN Cordelia Pen, NP   12.5 mg at 06/10/11 1610  . sertraline (ZOLOFT) tablet 100 mg  100 mg Oral Daily Cordelia Pen, NP   100 mg at 06/10/11 1026  . sodium chloride 0.9 % bolus 1,000 mL  1,000 mL Intravenous Once Dorthula Matas, PA   1,000 mL at 06/09/11 1336  . sodium chloride 0.9 % injection 3 mL  3 mL Intravenous Q12H Cordelia Pen, NP      . zolpidem (AMBIEN) tablet 10 mg  10 mg Oral Once Haydee Monica, MD   10 mg at 06/09/11 2346  . DISCONTD: enoxaparin (LOVENOX) injection 30 mg  30 mg Subcutaneous Q24H Cordelia Pen, NP       Medications Prior to Admission  Medication Sig Dispense Refill  . acetaminophen (TYLENOL) 500 MG tablet Take 1,000 mg by mouth every 6 (six) hours as needed. For pain      . budesonide (ENTOCORT EC) 3 MG 24 hr capsule Take 9 mg by mouth every morning. Pt takes 3 caps every morning      . clonazePAM (KLONOPIN) 1 MG tablet Take 1 mg by mouth 3 (three) times daily as needed. anxiety      . HYDROcodone-acetaminophen (VICODIN) 5-500 MG per tablet Take 1 tablet by mouth every 6 (six) hours as needed for pain.  10 tablet  0  . hydrocortisone (CORTEF) 20 MG tablet Take 20 mg by mouth 2 (two) times daily.        Marland Kitchen levothyroxine (SYNTHROID, LEVOTHROID) 50 MCG tablet Take 50 mcg by mouth daily.        . Multiple Vitamin (MULITIVITAMIN WITH MINERALS) TABS Take 1 tablet by mouth daily.        . potassium chloride SA (K-DUR,KLOR-CON) 20 MEQ tablet Take 1 tablet (20 mEq total) by mouth 2 (two) times daily.  30 tablet  0  . promethazine (PHENERGAN) 25 MG suppository Place 1 suppository (25 mg total) rectally every 6 (six) hours as needed for nausea.  12 each  0  . sertraline (ZOLOFT) 100 MG tablet Take 100 mg by mouth daily.          Allergies:  Allergies  Allergen Reactions  . Nsaids  Other (See Comments)    Bad fo colitis  . Zofran Other (See Comments)    Headache     Family History  Problem Relation Age of Onset  . Colon cancer Neg Hx     Social History:  reports that she  has been smoking Cigarettes.  She has a 30 pack-year smoking history. She has never used smokeless tobacco. She reports that she does not drink alcohol or use illicit drugs.     Blood pressure 105/63, pulse 52, temperature 97.5 F (36.4 C), temperature source Oral, resp. rate 16, height 5\' 4"  (1.626 m), weight 59.6 kg (131 lb 6.3 oz), last menstrual period 05/23/2011, SpO2 100.00%. Head: Normocephalic, without obvious abnormality, atraumatic Neck: no adenopathy, no carotid bruit, no JVD, supple, symmetrical, trachea midline and thyroid not enlarged, symmetric, no tenderness/mass/nodules Resp: clear to auscultation bilaterally Cardio: regular rate and rhythm, S1, S2 normal, no murmur, click, rub or gallop GI: Moderately tender in the entire abdomen slightly more on the right side no hepatosplenomegaly mass or guarding. Extremities: extremities normal, atraumatic, no cyanosis or edema  Results for orders placed during the hospital encounter of 06/09/11 (from the past 48 hour(s))  CBC     Status: Abnormal   Collection Time   06/09/11  1:21 PM      Component Value Range Comment   WBC 17.9 (*) 4.0 - 10.5 (K/uL)    RBC 4.38  3.87 - 5.11 (MIL/uL)    Hemoglobin 15.7 (*) 12.0 - 15.0 (g/dL)    HCT 16.1  09.6 - 04.5 (%)    MCV 102.7 (*) 78.0 - 100.0 (fL)    MCH 35.8 (*) 26.0 - 34.0 (pg)    MCHC 34.9  30.0 - 36.0 (g/dL)    RDW 40.9  81.1 - 91.4 (%)    Platelets 239  150 - 400 (K/uL)   COMPREHENSIVE METABOLIC PANEL     Status: Abnormal   Collection Time   06/09/11  1:21 PM      Component Value Range Comment   Sodium 138  135 - 145 (mEq/L)    Potassium 3.7  3.5 - 5.1 (mEq/L)    Chloride 108  96 - 112 (mEq/L)    CO2 13 (*) 19 - 32 (mEq/L)    Glucose, Bld 102 (*) 70 - 99 (mg/dL)    BUN 12  6 - 23  (mg/dL)    Creatinine, Ser 7.82  0.50 - 1.10 (mg/dL)    Calcium 95.6  8.4 - 10.5 (mg/dL)    Total Protein 8.3  6.0 - 8.3 (g/dL)    Albumin 4.7  3.5 - 5.2 (g/dL)    AST 19  0 - 37 (U/L)    ALT 12  0 - 35 (U/L)    Alkaline Phosphatase 89  39 - 117 (U/L)    Total Bilirubin 0.3  0.3 - 1.2 (mg/dL)    GFR calc non Af Amer >90  >90 (mL/min)    GFR calc Af Amer >90  >90 (mL/min)   LIPASE, BLOOD     Status: Normal   Collection Time   06/09/11  1:21 PM      Component Value Range Comment   Lipase 19  11 - 59 (U/L)   URINALYSIS, ROUTINE W REFLEX MICROSCOPIC     Status: Abnormal   Collection Time   06/09/11  1:30 PM      Component Value Range Comment   Color, Urine AMBER (*) YELLOW  BIOCHEMICALS MAY BE AFFECTED BY COLOR   APPearance TURBID (*) CLEAR     Specific Gravity, Urine 1.025  1.005 - 1.030     pH 6.0  5.0 - 8.0     Glucose, UA NEGATIVE  NEGATIVE (mg/dL)    Hgb urine dipstick LARGE (*) NEGATIVE  Bilirubin Urine SMALL (*) NEGATIVE     Ketones, ur TRACE (*) NEGATIVE (mg/dL)    Protein, ur 30 (*) NEGATIVE (mg/dL)    Urobilinogen, UA 0.2  0.0 - 1.0 (mg/dL)    Nitrite NEGATIVE  NEGATIVE     Leukocytes, UA SMALL (*) NEGATIVE    URINE MICROSCOPIC-ADD ON     Status: Abnormal   Collection Time   06/09/11  1:30 PM      Component Value Range Comment   Squamous Epithelial / LPF FEW (*) RARE     WBC, UA 0-2  <3 (WBC/hpf)    RBC / HPF 0-2  <3 (RBC/hpf)    Bacteria, UA MANY (*) RARE     Casts GRANULAR CAST (*) NEGATIVE     Urine-Other MUCOUS PRESENT     CLOSTRIDIUM DIFFICILE BY PCR     Status: Normal   Collection Time   06/09/11  4:57 PM      Component Value Range Comment   C difficile by pcr NEGATIVE  NEGATIVE    BASIC METABOLIC PANEL     Status: Abnormal   Collection Time   06/10/11  4:00 AM      Component Value Range Comment   Sodium 136  135 - 145 (mEq/L)    Potassium 3.6  3.5 - 5.1 (mEq/L)    Chloride 110  96 - 112 (mEq/L)    CO2 19  19 - 32 (mEq/L)    Glucose, Bld 93  70 - 99 (mg/dL)     BUN 13  6 - 23 (mg/dL)    Creatinine, Ser 1.61  0.50 - 1.10 (mg/dL)    Calcium 7.9 (*) 8.4 - 10.5 (mg/dL)    GFR calc non Af Amer >90  >90 (mL/min)    GFR calc Af Amer >90  >90 (mL/min)   CBC     Status: Abnormal   Collection Time   06/10/11  4:00 AM      Component Value Range Comment   WBC 9.3  4.0 - 10.5 (K/uL)    RBC 3.48 (*) 3.87 - 5.11 (MIL/uL)    Hemoglobin 12.5  12.0 - 15.0 (g/dL)    HCT 09.6  04.5 - 40.9 (%)    MCV 104.3 (*) 78.0 - 100.0 (fL)    MCH 35.9 (*) 26.0 - 34.0 (pg)    MCHC 34.4  30.0 - 36.0 (g/dL)    RDW 81.1  91.4 - 78.2 (%)    Platelets 178  150 - 400 (K/uL)    Dg Abd Acute W/chest  06/09/2011  *RADIOLOGY REPORT*  Clinical Data: 46 year old female with abdominal pain.  Colitis.  ACUTE ABDOMEN SERIES (ABDOMEN 2 VIEW & CHEST 1 VIEW)  Comparison: CT abdomen pelvis 06/07/2011 and earlier.  Findings: Stable lung volumes. Normal cardiac size and mediastinal contours.  Visualized tracheal air column is within normal limits. The lungs remain clear.  No pneumothorax or pneumoperitoneum.  Air-fluid levels in the transverse colon.  No dilated bowel loops. Tubal ligation clips.  Right upper quadrant surgical clips. No acute osseous abnormality identified.  IMPRESSION: 1. Nonobstructed bowel gas pattern, no free air.  Fluid in the colon as seen on the recent CT. 2. No acute cardiopulmonary abnormality.  Original Report Authenticated By: Harley Hallmark, M.D.    Assessment: Nausea vomiting and abdominal pain diarrhea acute on chronic of approximately 1 weeks duration Apparent lymphocytic colitis for which she is taking Entocort 1 day history of right-sided and right flank pain possibly representing kidney  stones  Plan:  Continue Enticort for now. Consider further imaging or urology consult for possibility of symptomatic kidney stones Await stool for enteric pathogens in addition to C. difficile and ova and parasite which are negative  Yaron Grasse C 06/10/2011, 1:18 PM

## 2011-06-11 LAB — BASIC METABOLIC PANEL WITH GFR
BUN: 6 mg/dL (ref 6–23)
CO2: 23 meq/L (ref 19–32)
Calcium: 8.1 mg/dL — ABNORMAL LOW (ref 8.4–10.5)
Chloride: 110 meq/L (ref 96–112)
Creatinine, Ser: 0.6 mg/dL (ref 0.50–1.10)
GFR calc Af Amer: >90 mL/min
GFR calc non Af Amer: >90 mL/min
Glucose, Bld: 89 mg/dL (ref 70–99)
Potassium: 3.4 meq/L — ABNORMAL LOW (ref 3.5–5.1)
Sodium: 140 meq/L (ref 135–145)

## 2011-06-11 LAB — CBC
MCHC: 33.6 g/dL (ref 30.0–36.0)
Platelets: 185 10*3/uL (ref 150–400)
RDW: 13.2 % (ref 11.5–15.5)

## 2011-06-11 MED ORDER — POTASSIUM CHLORIDE CRYS ER 20 MEQ PO TBCR
20.0000 meq | EXTENDED_RELEASE_TABLET | Freq: Once | ORAL | Status: AC
  Administered 2011-06-11: 20 meq via ORAL
  Filled 2011-06-11: qty 1

## 2011-06-11 NOTE — Progress Notes (Signed)
Subjective: Diet advanced to solid food however patient had some abdominal pain and vomiting with advancing diet.  Continues to have diarrhea.  Patient wondering when she can be discharged.  Objective: Vital signs in last 24 hours: Filed Vitals:   06/10/11 2159 06/11/11 0449 06/11/11 0614 06/11/11 1432  BP: 126/85 112/71  126/84  Pulse: 58 65  68  Temp: 98.3 F (36.8 C) 97.8 F (36.6 C)  97.8 F (36.6 C)  TempSrc: Oral Oral  Oral  Resp: 20 20  18   Height:      Weight:   59.2 kg (130 lb 8.2 oz)   SpO2: 99% 99%  99%   Weight change: -1.128 kg (-2 lb 7.8 oz)  Intake/Output Summary (Last 24 hours) at 06/11/11 1634 Last data filed at 06/10/11 2100  Gross per 24 hour  Intake    300 ml  Output      3 ml  Net    297 ml    Physical Exam: General: Awake, Oriented, No acute distress. HEENT: EOMI. Neck: Supple CV: S1 and S2 Lungs: Clear to ascultation bilaterally Abdomen: Soft, Nondistended, +bowel sounds, continues to have tenderness, no guarding. Ext: Good pulses. Trace edema.  Lab Results:  Basename 06/11/11 0420 06/10/11 0400  NA 140 136  K 3.4* 3.6  CL 110 110  CO2 23 19  GLUCOSE 89 93  BUN 6 13  CREATININE 0.60 0.76  CALCIUM 8.1* 7.9*  MG -- --  PHOS -- --    Basename 06/09/11 1321  AST 19  ALT 12  ALKPHOS 89  BILITOT 0.3  PROT 8.3  ALBUMIN 4.7    Basename 06/09/11 1321  LIPASE 19  AMYLASE --    Basename 06/11/11 0420 06/10/11 0400  WBC 7.1 9.3  NEUTROABS -- --  HGB 12.0 12.5  HCT 35.7* 36.3  MCV 104.7* 104.3*  PLT 185 178   No results found for this basename: CKTOTAL:3,CKMB:3,CKMBINDEX:3,TROPONINI:3 in the last 72 hours No components found with this basename: POCBNP:3 No results found for this basename: DDIMER:2 in the last 72 hours No results found for this basename: HGBA1C:2 in the last 72 hours No results found for this basename: CHOL:2,HDL:2,LDLCALC:2,TRIG:2,CHOLHDL:2,LDLDIRECT:2 in the last 72 hours No results found for this basename:  TSH,T4TOTAL,FREET3,T3FREE,THYROIDAB in the last 72 hours No results found for this basename: VITAMINB12:2,FOLATE:2,FERRITIN:2,TIBC:2,IRON:2,RETICCTPCT:2 in the last 72 hours  Micro Results: Recent Results (from the past 240 hour(s))  CLOSTRIDIUM DIFFICILE BY PCR     Status: Normal   Collection Time   06/09/11  4:57 PM      Component Value Range Status Comment   C difficile by pcr NEGATIVE  NEGATIVE  Final     Studies/Results: No results found.  Medications: I have reviewed the patient's current medications. Scheduled Meds:    . budesonide  9 mg Oral BH-q7a  . enoxaparin (LOVENOX) injection  40 mg Subcutaneous Q24H  . hydrocortisone  20 mg Oral BID WC  . levothyroxine  50 mcg Oral Daily  . nicotine  21 mg Transdermal Daily  . pantoprazole (PROTONIX) IV  40 mg Intravenous Q24H  . sertraline  100 mg Oral Daily  . sodium chloride  3 mL Intravenous Q12H   Continuous Infusions:    . sodium chloride 75 mL/hr at 06/11/11 0846   PRN Meds:.acetaminophen, acetaminophen, acetaminophen, clonazePAM, HYDROmorphone, oxyCODONE, promethazine, zolpidem  Assessment/Plan: 1. Nausea, vomiting, diarrhea and abdominal pain: Unclear etiology at this point. Pt with hx of microscopic colitis and cdiff colitis.  C. difficile PCR  negative.  Stool culture pending.  Ova and parasite pending.  Appreciate GI input.  Continue hydrocortisone.  CT scan on 3/5 was unremarkable.  GI input appreciated.  Discussed with the patient about urology evaluation on 06/10/2011 as suggested by GI whether patient's bilateral nephrolithiasis could be causing the pain.  Patient at this time declined urology evaluation. Continue supportive care with IV fluids.  Currently on a solid diet.  2. Metabolic acidosis: secondary to above problem.  Resolved.  3. Leukocytosis: Resolved.    4. Dehydration: Resolved.  Continue gentle hydration.  5. Tachycardia: Resolved.  Likely due to dehydration.    6. Adrenal insufficiency on chronic  steriods: no indication for stress steriods at this time. Monitor.   7. Prophylaxis: SQ lovenox  8.  Disposition.  Pending.  Patient continues to have persistent abdominal pain, diarrhea, and a limited oral intake.    LOS: 2 days  Desiray Orchard A, MD 06/11/2011, 4:34 PM

## 2011-06-12 MED ORDER — HYDROCODONE-ACETAMINOPHEN 5-500 MG PO TABS: 1.0000 | ORAL_TABLET | Freq: Four times a day (QID) | ORAL | Status: AC | PRN

## 2011-06-12 MED ORDER — NICOTINE 21 MG/24HR TD PT24: 1.0000 | MEDICATED_PATCH | Freq: Every day | TRANSDERMAL | Status: DC

## 2011-06-12 MED ORDER — ONDANSETRON 4 MG PO TBDP: 4.0000 mg | ORAL_TABLET | Freq: Three times a day (TID) | ORAL | Status: AC | PRN

## 2011-06-12 MED ORDER — UNABLE TO FIND: Status: DC

## 2011-06-12 NOTE — Progress Notes (Signed)
Subjective: Patient still having abdominal pain.  Diarrhea improved mildly per patient.    Objective: Vital signs in last 24 hours: Filed Vitals:   06/11/11 0614 06/11/11 1432 06/11/11 2015 06/12/11 0419  BP:  126/84 135/83 133/78  Pulse:  68 65 63  Temp:  97.8 F (36.6 C) 98.6 F (37 C) 97.3 F (36.3 C)  TempSrc:  Oral Oral Oral  Resp:  18 20 18   Height:      Weight: 59.2 kg (130 lb 8.2 oz)     SpO2:  99% 97% 99%   Weight change:   Intake/Output Summary (Last 24 hours) at 06/12/11 1206 Last data filed at 06/12/11 0433  Gross per 24 hour  Intake  627.3 ml  Output      0 ml  Net  627.3 ml    Physical Exam: General: Awake, Oriented, No acute distress. HEENT: EOMI. Neck: Supple CV: S1 and S2 Lungs: Clear to ascultation bilaterally Abdomen: Soft, Nondistended, +bowel sounds, continues to have tenderness, no guarding. Ext: Good pulses. Trace edema.  Lab Results:  Basename 06/11/11 0420 06/10/11 0400  NA 140 136  K 3.4* 3.6  CL 110 110  CO2 23 19  GLUCOSE 89 93  BUN 6 13  CREATININE 0.60 0.76  CALCIUM 8.1* 7.9*  MG -- --  PHOS -- --    Basename 06/09/11 1321  AST 19  ALT 12  ALKPHOS 89  BILITOT 0.3  PROT 8.3  ALBUMIN 4.7    Basename 06/09/11 1321  LIPASE 19  AMYLASE --    Basename 06/11/11 0420 06/10/11 0400  WBC 7.1 9.3  NEUTROABS -- --  HGB 12.0 12.5  HCT 35.7* 36.3  MCV 104.7* 104.3*  PLT 185 178   No results found for this basename: CKTOTAL:3,CKMB:3,CKMBINDEX:3,TROPONINI:3 in the last 72 hours No components found with this basename: POCBNP:3 No results found for this basename: DDIMER:2 in the last 72 hours No results found for this basename: HGBA1C:2 in the last 72 hours No results found for this basename: CHOL:2,HDL:2,LDLCALC:2,TRIG:2,CHOLHDL:2,LDLDIRECT:2 in the last 72 hours No results found for this basename: TSH,T4TOTAL,FREET3,T3FREE,THYROIDAB in the last 72 hours No results found for this basename:  VITAMINB12:2,FOLATE:2,FERRITIN:2,TIBC:2,IRON:2,RETICCTPCT:2 in the last 72 hours  Micro Results: Recent Results (from the past 240 hour(s))  CLOSTRIDIUM DIFFICILE BY PCR     Status: Normal   Collection Time   06/09/11  4:57 PM      Component Value Range Status Comment   C difficile by pcr NEGATIVE  NEGATIVE  Final   STOOL CULTURE     Status: Normal (Preliminary result)   Collection Time   06/09/11  4:57 PM      Component Value Range Status Comment   Specimen Description STOOL   Final    Special Requests NONE   Final    Culture Culture reincubated for better growth   Final    Report Status PENDING   Incomplete     Studies/Results: No results found.  Medications: I have reviewed the patient's current medications. Scheduled Meds:    . budesonide  9 mg Oral BH-q7a  . enoxaparin (LOVENOX) injection  40 mg Subcutaneous Q24H  . hydrocortisone  20 mg Oral BID WC  . levothyroxine  50 mcg Oral Daily  . nicotine  21 mg Transdermal Daily  . pantoprazole (PROTONIX) IV  40 mg Intravenous Q24H  . potassium chloride  20 mEq Oral Once  . sertraline  100 mg Oral Daily  . sodium chloride  3 mL Intravenous  Q12H   Continuous Infusions:    . sodium chloride 50 mL/hr at 06/12/11 0433   PRN Meds:.acetaminophen, acetaminophen, acetaminophen, clonazePAM, HYDROmorphone, oxyCODONE, promethazine, zolpidem  Assessment/Plan: 1. Nausea, vomiting, diarrhea and abdominal pain: Unclear etiology at this point. Pt with hx of microscopic colitis and cdiff colitis.  C. difficile PCR negative.  Stool culture pending.  Ova and parasite pending.  Continue hydrocortisone.  CT scan on 3/5 was unremarkable.  GI input appreciated.  Discussed with the patient about urology evaluation on 06/10/2011 as suggested by GI whether patient's bilateral nephrolithiasis could be causing the pain.  Patient at this time declined urology evaluation.  Discussed with Dr. Dulce Sellar, GI, recommended the patient could be discharged at this time  and have her followup was her gastroenterologist Dr. Opal Sidles at [facility] or her gastric neurologist at [facility] for further care and management.  Continue solid diet at this time.  2. Metabolic acidosis: secondary to above problem.  Resolved.  3. Leukocytosis: Resolved.    4. Dehydration: Resolved.  Continue gentle hydration.  5. Tachycardia: Resolved.  Likely due to dehydration.    6. 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 Monitor.   7. Prophylaxis: SQ lovenox  8.  Disposition.  Discharge the patient today.   LOS: 3 days  Nahjae Hoeg A, MD 06/12/2011, 12:06 PM

## 2011-06-12 NOTE — Discharge Summary (Signed)
Discharge Summary  Dawn Frost MR#: [id number]  DOB:1965-11-02  Date of Admission: 06/09/2011 Date of Discharge: 06/12/2011  Patient's PCP: Dawn Quam, MD, MD Patient's gastroenterologist: Dr. Opal Frost, [facility] Patient's gastroenterologist at [facility]: Dr. Imogene Frost Patient's immunologist at [facility]  Attending Physician:Dawn Frost A  Consults: Treatment Team:  Dawn Folk, MD, GI  Discharge Diagnoses: Principal Problem:  *Diarrhea Active Problems:  DEPRESSION  Adrenal insufficiency  Hypothyroid  Abdominal pain, acute  Vomiting  Leukocytosis  Dehydration  Brief Admitting History and Physical 46 year old Caucasian female with history of microscopic colitis with complicated GI history presented on 06/09/2011 with nausea, vomiting, and diarrhea.  Patient has seen the [facility] and [facility] for her care.  Patient also sees Dr. Opal Frost, gastroenterology at [facility].  Patient was admitted for management of her nausea, vomiting, diarrhea leading to dehydration.  Discharge Medications Medication List  As of 06/12/2011 12:16 PM   TAKE these medications         acetaminophen 500 MG tablet   Commonly known as: TYLENOL   Take 1,000 mg by mouth every 6 (six) hours as needed. For pain      budesonide 3 MG 24 hr capsule   Commonly known as: ENTOCORT EC   Take 9 mg by mouth every morning. Pt takes 3 caps every morning      clonazePAM 1 MG tablet   Commonly known as: KLONOPIN   Take 1 mg by mouth 3 (three) times daily as needed. anxiety      HYDROcodone-acetaminophen 5-500 MG per tablet   Commonly known as: VICODIN   Take 1 tablet by mouth every 6 (six) hours as needed for pain.      hydrocortisone 20 MG tablet   Commonly known as: CORTEF   Take 20 mg by mouth 2 (two) times daily.      levothyroxine 50 MCG tablet   Commonly known as: SYNTHROID, LEVOTHROID   Take 50 mcg by mouth daily.      mulitivitamin with minerals Tabs   Take 1 tablet by mouth  daily.      nicotine 21 mg/24hr patch   Commonly known as: NICODERM CQ - dosed in mg/24 hours   Place 1 patch onto the skin daily. Do not smoke while on the patch.      ondansetron 4 MG disintegrating tablet   Commonly known as: ZOFRAN-ODT   Take 1 tablet (4 mg total) by mouth every 8 (eight) hours as needed for nausea.      potassium chloride SA 20 MEQ tablet   Commonly known as: K-DUR,KLOR-CON   Take 1 tablet (20 mEq total) by mouth 2 (two) times daily.      promethazine 25 MG suppository   Commonly known as: PHENERGAN   Place 1 suppository (25 mg total) rectally every 6 (six) hours as needed for nausea.      sertraline 100 MG tablet   Commonly known as: ZOLOFT   Take 100 mg by mouth daily.      UNABLE TO FIND   Please excuse [name] from any work obligations from 06/09/2011 - 06/14/2011 as she was hospitalized. Patient may return to work on 06/15/2011 without any restrictions.            Hospital Course: 1. Nausea, vomiting, diarrhea and abdominal pain: Unclear etiology at this point. Pt with hx of microscopic colitis and cdiff colitis.  C. difficile PCR negative.  Stool culture pending.  Ova and parasite pending.  Continue hydrocortisone.  CT scan on 3/5 was  unremarkable.  GI Dr. Madilyn Frost and Dr. Dulce Frost has evaluated the patient.  Discussed with the patient about urology evaluation on 06/10/2011 as suggested by GI whether patient's bilateral nephrolithiasis could be causing the pain.  Patient at this time declined urology evaluation.  Given patient's GI issues are chronic and given improvement in diarrhea, GI at this time recommended that the patient could be discharged and have her followup was her gastroenterologist Dr. Opal Frost at [facility] or her gastric neurologist at [facility] for further care and management.  Patient at this time was tolerating solid food prior to discharge.  Still having abdominal pain for which hydrocodone given with 15 tablets.  2. Metabolic acidosis:  secondary to above problem.  Resolved.  3. Leukocytosis: Resolved.    4. Dehydration: Resolved.  Continue gentle hydration.  5. Tachycardia: Resolved.  Likely due to dehydration.    6. Adrenal insufficiency on chronic steriods: no indication for stress steriods during the course of hospital stay.   Day of Discharge BP 133/78  Pulse 63  Temp(Src) 97.3 F (36.3 C) (Oral)  Resp 18  Ht 5\' 4"  (1.626 m)  Wt 59.2 kg (130 lb 8.2 oz)  BMI 22.40 kg/m2  SpO2 99%  LMP 05/23/2011  Results for orders placed during the hospital encounter of 06/09/11 (from the past 48 hour(s))  CBC     Status: Abnormal   Collection Time   06/11/11  4:20 AM      Component Value Range Comment   WBC 7.1  4.0 - 10.5 (K/uL)    RBC 3.41 (*) 3.87 - 5.11 (MIL/uL)    Hemoglobin 12.0  12.0 - 15.0 (g/dL)    HCT 09.8 (*) 11.9 - 46.0 (%)    MCV 104.7 (*) 78.0 - 100.0 (fL)    MCH 35.2 (*) 26.0 - 34.0 (pg)    MCHC 33.6  30.0 - 36.0 (g/dL)    RDW 14.7  82.9 - 56.2 (%)    Platelets 185  150 - 400 (K/uL)   BASIC METABOLIC PANEL     Status: Abnormal   Collection Time   06/11/11  4:20 AM      Component Value Range Comment   Sodium 140  135 - 145 (mEq/L)    Potassium 3.4 (*) 3.5 - 5.1 (mEq/L)    Chloride 110  96 - 112 (mEq/L)    CO2 23  19 - 32 (mEq/L)    Glucose, Bld 89  70 - 99 (mg/dL)    BUN 6  6 - 23 (mg/dL)    Creatinine, Ser 1.30  0.50 - 1.10 (mg/dL)    Calcium 8.1 (*) 8.4 - 10.5 (mg/dL)    GFR calc non Af Amer >90  >90 (mL/min)    GFR calc Af Amer >90  >90 (mL/min)     Dg Tibia/fibula Left  05/16/2011  *RADIOLOGY REPORT*  Clinical Data: Injured lower leg 2 days ago with tenderness  LEFT TIBIA AND FIBULA - 2 VIEW  Comparison: None.  Findings: The left tibia and fibula appear intact.  No acute bony abnormality is seen.  No focal demineralization is noted.  No opaque foreign body is seen.  IMPRESSION: Negative.  Original Report Authenticated By: Juline Patch, M.D.   US Transvaginal Non-ob  05/25/2011  *RADIOLOGY  REPORT*  Clinical Data:  This lower pelvic and abdomen pain.  TRANSABDOMINAL AND TRANSVAGINAL ULTRASOUND OF PELVIS DOPPLER ULTRASOUND OF OVARIES  Technique:  Both transabdominal and transvaginal ultrasound examinations of the pelvis were performed. Transabdominal technique was  performed for global imaging of the pelvis including uterus, ovaries, adnexal regions, and pelvic cul-de-sac.  It was necessary to proceed with endovaginal exam following the transabdominal exam to visualize the uterus, endometrium and ovaries in better detail.  Color and duplex Doppler ultrasound was utilized to evaluate blood flow to the ovaries.  Comparison:  None.  Findings:  Uterus:  Normal in size and appearance  Endometrium:  Normal in thickness and appearance  Right ovary: Normal appearance/no adnexal mass  Left ovary:   Normal appearance/no adnexal mass  Pulsed Doppler evaluation demonstrates normal low-resistance arterial and venous waveforms in both ovaries.  IMPRESSION: Normal exam.  No evidence of pelvic mass or other significant abnormality.  No sonographic evidence for ovarian torsion.  Original Report Authenticated By: Darrol Angel, M.D.   US Pelvis Complete  05/25/2011  *RADIOLOGY REPORT*  Clinical Data:  This lower pelvic and abdomen pain.  TRANSABDOMINAL AND TRANSVAGINAL ULTRASOUND OF PELVIS DOPPLER ULTRASOUND OF OVARIES  Technique:  Both transabdominal and transvaginal ultrasound examinations of the pelvis were performed. Transabdominal technique was performed for global imaging of the pelvis including uterus, ovaries, adnexal regions, and pelvic cul-de-sac.  It was necessary to proceed with endovaginal exam following the transabdominal exam to visualize the uterus, endometrium and ovaries in better detail.  Color and duplex Doppler ultrasound was utilized to evaluate blood flow to the ovaries.  Comparison:  None.  Findings:  Uterus:  Normal in size and appearance  Endometrium:  Normal in thickness and appearance   Right ovary: Normal appearance/no adnexal mass  Left ovary:   Normal appearance/no adnexal mass  Pulsed Doppler evaluation demonstrates normal low-resistance arterial and venous waveforms in both ovaries.  IMPRESSION: Normal exam.  No evidence of pelvic mass or other significant abnormality.  No sonographic evidence for ovarian torsion.  Original Report Authenticated By: Darrol Angel, M.D.   Ct Abdomen Pelvis W Contrast  06/07/2011  *RADIOLOGY REPORT*  Clinical Data: Right-sided abdominal squeezing sensation, vomiting, diarrhea, history kidney stones, colitis, Wolff-Parkinson-White syndrome, cholecystectomy  CT ABDOMEN AND PELVIS WITH CONTRAST  Technique:  Multidetector CT imaging of the abdomen and pelvis was performed following the standard protocol during bolus administration of intravenous contrast. Sagittal and coronal MPR images reconstructed from axial data set.  Contrast: OMNIPAQUE IOHEXOL 300 MG/ML IV SOLN. Dilute oral contrast.  Comparison: 01/30/2011  Findings: Minimal dependent atelectasis right lung base. Mild probable physiologic biliary dilatation post cholecystectomy. Liver, spleen, pancreas, and adrenal glands normal appearance. Symmetric nephrograms with small bilateral nonobstructing renal calculi. No hydronephrosis or ureteral dilatation. Unremarkable bladder, uterus and adnexae. Fluid throughout colon to rectum consistent with history of diarrhea. Stomach and small bowel loops unremarkable. No mass, adenopathy, free fluid or inflammatory process. Normal appendix. No hernia or acute bony lesion.  IMPRESSION: Bilateral nonobstructing renal calculi. Fluid throughout colon to rectum consistent with history of diarrhea. Probable physiologic dilatation of the biliary system post cholecystectomy, recommend correlation with LFTs. No other intra abdominal or intrapelvic abnormalities identified.  Original Report Authenticated By: Lollie Marrow, M.D.   [place] Art/ven Flow Abd Pelv  Doppler  05/25/2011  *RADIOLOGY REPORT*  Clinical Data:  This lower pelvic and abdomen pain.  TRANSABDOMINAL AND TRANSVAGINAL ULTRASOUND OF PELVIS DOPPLER ULTRASOUND OF OVARIES  Technique:  Both transabdominal and transvaginal ultrasound examinations of the pelvis were performed. Transabdominal technique was performed for global imaging of the pelvis including uterus, ovaries, adnexal regions, and pelvic cul-de-sac.  It was necessary to proceed with endovaginal exam following the transabdominal  exam to visualize the uterus, endometrium and ovaries in better detail.  Color and duplex Doppler ultrasound was utilized to evaluate blood flow to the ovaries.  Comparison:  None.  Findings:  Uterus:  Normal in size and appearance  Endometrium:  Normal in thickness and appearance  Right ovary: Normal appearance/no adnexal mass  Left ovary:   Normal appearance/no adnexal mass  Pulsed Doppler evaluation demonstrates normal low-resistance arterial and venous waveforms in both ovaries.  IMPRESSION: Normal exam.  No evidence of pelvic mass or other significant abnormality.  No sonographic evidence for ovarian torsion.  Original Report Authenticated By: Darrol Angel, M.D.   Dg Abd Acute W/chest  06/09/2011  *RADIOLOGY REPORT*  Clinical Data: 46 year old female with abdominal pain.  Colitis.  ACUTE ABDOMEN SERIES (ABDOMEN 2 VIEW & CHEST 1 VIEW)  Comparison: CT abdomen pelvis 06/07/2011 and earlier.  Findings: Stable lung volumes. Normal cardiac size and mediastinal contours.  Visualized tracheal air column is within normal limits. The lungs remain clear.  No pneumothorax or pneumoperitoneum.  Air-fluid levels in the transverse colon.  No dilated bowel loops. Tubal ligation clips.  Right upper quadrant surgical clips. No acute osseous abnormality identified.  IMPRESSION: 1. Nonobstructed bowel gas pattern, no free air.  Fluid in the colon as seen on the recent CT. 2. No acute cardiopulmonary abnormality.  Original Report  Authenticated By: Harley Hallmark, M.D.     Disposition: Home with followup as indicated below.  Diet: Heart healthy diet.  Activity: Resume as tolerated   Follow-up Appts: Discharge Orders    Future Orders Please Complete By Expires   Diet - low sodium heart healthy      Increase activity slowly      Discharge instructions      Comments:   Followup with MARLETTE,MARNIE, MD (PCP) in 1 week. Followup with Dr. Opal Frost (GI) in 1 week, followup with [facility] if you would like to establish care with [facility]. Followup with [facility] when available.      TESTS THAT NEED FOLLOW-UP Stool culture and ova and parasite.  Time spent on discharge, talking to the patient, and coordinating care: 25 mins.   Signed: Cristal Ford, MD 06/12/2011, 12:16 PM

## 2011-06-13 LAB — STOOL CULTURE

## 2011-06-14 LAB — OVA AND PARASITE EXAMINATION: Ova and parasites: NONE SEEN

## 2011-06-16 ENCOUNTER — Encounter (HOSPITAL_BASED_OUTPATIENT_CLINIC_OR_DEPARTMENT_OTHER): Payer: Self-pay | Admitting: Family Medicine

## 2011-06-16 ENCOUNTER — Emergency Department (HOSPITAL_BASED_OUTPATIENT_CLINIC_OR_DEPARTMENT_OTHER)
Admission: EM | Admit: 2011-06-16 | Discharge: 2011-06-17 | Disposition: A | Discharge status: Home / Self Care | Payer: 59 | Attending: Emergency Medicine | Admitting: Emergency Medicine

## 2011-06-16 DIAGNOSIS — R109 Unspecified abdominal pain: Secondary | ICD-10-CM | POA: Insufficient documentation

## 2011-06-16 DIAGNOSIS — E876 Hypokalemia: Secondary | ICD-10-CM | POA: Insufficient documentation

## 2011-06-16 DIAGNOSIS — F17200 Nicotine dependence, unspecified, uncomplicated: Secondary | ICD-10-CM | POA: Insufficient documentation

## 2011-06-16 DIAGNOSIS — E2749 Other adrenocortical insufficiency: Secondary | ICD-10-CM | POA: Insufficient documentation

## 2011-06-16 DIAGNOSIS — I456 Pre-excitation syndrome: Secondary | ICD-10-CM | POA: Insufficient documentation

## 2011-06-16 DIAGNOSIS — R197 Diarrhea, unspecified: Secondary | ICD-10-CM | POA: Insufficient documentation

## 2011-06-16 DIAGNOSIS — K5289 Other specified noninfective gastroenteritis and colitis: Secondary | ICD-10-CM | POA: Insufficient documentation

## 2011-06-16 DIAGNOSIS — K529 Noninfective gastroenteritis and colitis, unspecified: Secondary | ICD-10-CM

## 2011-06-16 DIAGNOSIS — F341 Dysthymic disorder: Secondary | ICD-10-CM | POA: Insufficient documentation

## 2011-06-16 DIAGNOSIS — Z87442 Personal history of urinary calculi: Secondary | ICD-10-CM | POA: Insufficient documentation

## 2011-06-16 DIAGNOSIS — E079 Disorder of thyroid, unspecified: Secondary | ICD-10-CM | POA: Insufficient documentation

## 2011-06-16 DIAGNOSIS — R112 Nausea with vomiting, unspecified: Secondary | ICD-10-CM | POA: Insufficient documentation

## 2011-06-16 LAB — CBC
Hemoglobin: 14.2 g/dL (ref 12.0–15.0)
MCHC: 35.1 g/dL (ref 30.0–36.0)
RBC: 4.03 MIL/uL (ref 3.87–5.11)

## 2011-06-16 LAB — BASIC METABOLIC PANEL
GFR calc non Af Amer: >90 mL/min (ref >90)
Glucose, Bld: 100 mg/dL — ABNORMAL HIGH (ref 70–99)
Potassium: 2.7 mEq/L — CL (ref 3.5–5.1)
Sodium: 137 mEq/L (ref 135–145)

## 2011-06-16 MED ORDER — HYDROMORPHONE HCL PF 1 MG/ML IJ SOLN
1.0000 mg | Freq: Once | INTRAMUSCULAR | Status: AC
  Administered 2011-06-16: 1 mg via INTRAVENOUS
  Filled 2011-06-16: qty 1

## 2011-06-16 MED ORDER — POTASSIUM CHLORIDE 10 MEQ/100ML IV SOLN
10.0000 meq | Freq: Once | INTRAVENOUS | Status: AC
  Administered 2011-06-16: 10 meq via INTRAVENOUS
  Filled 2011-06-16: qty 100

## 2011-06-16 MED ORDER — SODIUM CHLORIDE 0.9 % IV SOLN: INTRAVENOUS | Status: DC

## 2011-06-16 MED ORDER — SODIUM CHLORIDE 0.9 % IV BOLUS (SEPSIS)
1000.0000 mL | Freq: Once | INTRAVENOUS | Status: AC
  Administered 2011-06-16: 1000 mL via INTRAVENOUS

## 2011-06-16 MED ORDER — PROMETHAZINE HCL 25 MG/ML IJ SOLN
12.5000 mg | Freq: Once | INTRAMUSCULAR | Status: AC
  Administered 2011-06-16: 12.5 mg via INTRAVENOUS
  Filled 2011-06-16: qty 1

## 2011-06-16 NOTE — Discharge Instructions (Signed)
Continue take her oral potassium that she had at home. Followup with your [facility] internal medicine physician tomorrow for recheck. Received here tonight total of 20 mEq of potassium supplementation. Rest of electrolytes without abnormalities no evidence of low sodium or chloride and no evidence of metabolic acidosis. Also received IV hydration here with normal saline. Blood glucose was normal. Return for new or worse symptoms.

## 2011-06-16 NOTE — ED Provider Notes (Addendum)
History     CSN: [id number]  Arrival date & time 06/16/11  4098   First MD Initiated Contact with Patient 06/16/11 1907      Chief Complaint  Patient presents with  . Emesis  . Diarrhea    (Consider location/radiation/quality/duration/timing/severity/associated sxs/prior treatment) Patient is a 46 y.o. female presenting with vomiting and diarrhea. The history is provided by the patient.  Emesis  This is a new problem. The current episode started more than 2 days ago. The problem occurs 2 to 4 times per day. The problem has not changed since onset.The emesis has an appearance of stomach contents. There has been no fever. Associated symptoms include abdominal pain and diarrhea. Pertinent negatives include no cough, no fever and no headaches.  Diarrhea The primary symptoms include abdominal pain, nausea, vomiting and diarrhea. Primary symptoms do not include fever, dysuria or rash. The illness began 3 to 5 days ago. The problem has been gradually worsening.  The illness does not include back pain.   Patient with chronic diarrheal illness etiology not known extensive workup in process. Patient recently admitted at [facility] for dehydration and electrolyte abnormality that was on March 7 she was discharged on March 10 shortly after discharge diarrhea started again.  Current symptoms are typical for was been going on she has a [facility] physician that referred her in for IV hydration and an outpatient Center but patient was not able to get there before they closed. Her symptoms are usually associated with some abdominal pain. Past Medical History  Diagnosis Date  . Colitis   . WPW (Wolff-Parkinson-White syndrome)   . Kidney calculi   . Kidney stone   . Addison disease   . Thyroid disease   . Clostridium difficile diarrhea   . Anxiety and depression   . Chronic diarrhea   . Chronic back pain   . Drug-seeking behavior   . Irritable bowel syndrome (IBS)     . Anemia     Past Surgical History  Procedure Date  . Cholecystectomy   . Tubal ligation   . Lithotripsy   . Sphincterotomy   . Cardiac electrophysiology mapping and ablation     for WPW    Family History  Problem Relation Age of Onset  . Colon cancer Neg Hx     History  Substance Use Topics  . Smoking status: Current Everyday Smoker -- 1.0 packs/day for 30 years    Types: Cigarettes  . Smokeless tobacco: Never Used  . Alcohol Use: No    OB History    Grav Para Term Preterm Abortions TAB SAB Ect Mult Living   3 3              Review of Systems  Constitutional: Negative for fever.  HENT: Negative for neck pain.   Eyes: Negative for redness.  Respiratory: Negative for cough and shortness of breath.   Cardiovascular: Negative for chest pain.  Gastrointestinal: Positive for nausea, vomiting, abdominal pain and diarrhea. Negative for blood in stool.  Genitourinary: Negative for dysuria.  Musculoskeletal: Negative for back pain.  Skin: Negative for rash.  Neurological: Negative for headaches.  Hematological: Does not bruise/bleed easily.    Allergies  Nsaids and Zofran  Home Medications   Current Outpatient Rx  Name Route Sig Dispense Refill  . ACETAMINOPHEN 500 MG PO TABS Oral Take 1,000 mg by mouth every 6 (six) hours as needed. For pain    . BUDESONIDE ER 3 MG PO CP24  Oral Take 9 mg by mouth every morning. Pt takes 3 caps every morning    . HYDROCORTISONE 20 MG PO TABS Oral Take 20 mg by mouth 2 (two) times daily.      Marland Kitchen LEVOTHYROXINE SODIUM 50 MCG PO TABS Oral Take 50 mcg by mouth daily.      . ADULT MULTIVITAMIN W/MINERALS CH Oral Take 1 tablet by mouth daily.      Marland Kitchen NICOTINE 21 MG/24HR TD PT24 Transdermal Place 1 patch onto the skin daily. Do not smoke while on the patch. 28 patch 0  . ONDANSETRON 4 MG PO TBDP Oral Take 1 tablet (4 mg total) by mouth every 8 (eight) hours as needed for nausea. 20 tablet 0  . POTASSIUM CHLORIDE CRYS ER 20 MEQ PO TBCR Oral  Take 1 tablet (20 mEq total) by mouth 2 (two) times daily. 30 tablet 0  . SERTRALINE HCL 100 MG PO TABS Oral Take 100 mg by mouth daily.      Marland Kitchen CLONAZEPAM 1 MG PO TABS Oral Take 1 mg by mouth 3 (three) times daily as needed. anxiety    . HYDROCODONE-ACETAMINOPHEN 5-500 MG PO TABS Oral Take 1 tablet by mouth every 6 (six) hours as needed for pain. 15 tablet 0  . UNABLE TO FIND  Please excuse Ms. Sofranko from any work obligations from 06/09/2011 - 06/14/2011 as she was hospitalized. Patient may return to work on 06/15/2011 without any restrictions. 1 Units 0    BP 152/100  Pulse 96  Temp(Src) 98.1 F (36.7 C) (Oral)  Resp 17  SpO2 99%  LMP 05/23/2011  Physical Exam  Nursing note and vitals reviewed. Constitutional: She is oriented to person, place, and time. She appears well-developed and well-nourished.  HENT:  Head: Normocephalic and atraumatic.  Mouth/Throat: Oropharynx is clear and moist.  Eyes: Conjunctivae and EOM are normal. Pupils are equal, round, and reactive to light.  Neck: Normal range of motion. Neck supple.  Cardiovascular: Normal rate, regular rhythm and normal heart sounds.   No murmur heard. Pulmonary/Chest: Effort normal and breath sounds normal.  Abdominal: Soft. Bowel sounds are normal. There is no tenderness.  Musculoskeletal: Normal range of motion. She exhibits no edema.  Neurological: She is alert and oriented to person, place, and time. No cranial nerve deficit. She exhibits normal muscle tone. Coordination normal.  Skin: Skin is warm. No rash noted.    ED Course  Procedures (including critical care time)  Labs Reviewed  CBC - Abnormal; Notable for the following:    WBC 10.6 (*)    MCV 100.2 (*)    MCH 35.2 (*)    All other components within normal limits  BASIC METABOLIC PANEL - Abnormal; Notable for the following:    Potassium 2.7 (*)    Glucose, Bld 100 (*)    All other components within normal limits   No results found.   1. Enteritis   2.  Hypokalemia       MDM  Patient with history of chronic diarrhea with etiology is not known currently being worked up by specialists at [facility]. Patient last seen in the emergency department on March 7 was admitted for dehydration and electrolyte abnormalities at that time prior ED visit to that was on March 4. Patient also seen for abdominal pain on February 20. Patient was referred for IV fluids and an outpatient Center by her primary care internal medicine Dr. today but not able to make it. Workup here tonight shows no signs  of significant dehydration however potassium was low at 2.7 patient's sodium and chloride were fine no evidence of a metabolic acidosis and blood glucose was 100. Patient received IV normal saline hydration and also received IV a total of 20 mEq of potassium divided and 10 mEq doses.  The patient is stable to go home will need close followup if diarrhea continues electrolyte imbalance could get worse she does have oral potassium supplementation to take at home.  Previous ED visits and admission was reviewed.      Shelda Jakes, MD 06/16/11 2325  Shelda Jakes, MD 06/16/11 [phone]

## 2011-06-16 NOTE — ED Notes (Addendum)
Pt c/o n/v/d x 2 days. Pt sts she was dc'ed from [facility] on Sunday for same. Pt sts she was ruled out for c-diff but did not hear back from norovirus results.

## 2011-06-20 ENCOUNTER — Emergency Department (HOSPITAL_COMMUNITY)
Admission: EM | Admit: 2011-06-20 | Discharge: 2011-06-20 | Disposition: A | Discharge status: Home / Self Care | Payer: 59 | Attending: Emergency Medicine | Admitting: Emergency Medicine

## 2011-06-20 ENCOUNTER — Encounter (HOSPITAL_COMMUNITY): Payer: Self-pay

## 2011-06-20 ENCOUNTER — Other Ambulatory Visit: Payer: Self-pay

## 2011-06-20 DIAGNOSIS — R1110 Vomiting, unspecified: Secondary | ICD-10-CM | POA: Insufficient documentation

## 2011-06-20 DIAGNOSIS — F329 Major depressive disorder, single episode, unspecified: Secondary | ICD-10-CM | POA: Insufficient documentation

## 2011-06-20 DIAGNOSIS — F17200 Nicotine dependence, unspecified, uncomplicated: Secondary | ICD-10-CM | POA: Insufficient documentation

## 2011-06-20 DIAGNOSIS — Z87442 Personal history of urinary calculi: Secondary | ICD-10-CM | POA: Insufficient documentation

## 2011-06-20 DIAGNOSIS — R197 Diarrhea, unspecified: Secondary | ICD-10-CM | POA: Insufficient documentation

## 2011-06-20 DIAGNOSIS — F3289 Other specified depressive episodes: Secondary | ICD-10-CM | POA: Insufficient documentation

## 2011-06-20 DIAGNOSIS — F411 Generalized anxiety disorder: Secondary | ICD-10-CM | POA: Insufficient documentation

## 2011-06-20 DIAGNOSIS — I456 Pre-excitation syndrome: Secondary | ICD-10-CM | POA: Insufficient documentation

## 2011-06-20 LAB — COMPREHENSIVE METABOLIC PANEL
ALT: 11 U/L (ref 0–35)
AST: 17 U/L (ref 0–37)
Albumin: 4.2 g/dL (ref 3.5–5.2)
Alkaline Phosphatase: 84 U/L (ref 39–117)
BUN: 12 mg/dL (ref 6–23)
CO2: 23 mEq/L (ref 19–32)
Calcium: 9.4 mg/dL (ref 8.4–10.5)
Chloride: 96 mEq/L (ref 96–112)
Creatinine, Ser: 0.61 mg/dL (ref 0.50–1.10)
GFR calc Af Amer: >90 mL/min (ref >90)
GFR calc non Af Amer: >90 mL/min (ref >90)
Glucose, Bld: 94 mg/dL (ref 70–99)
Potassium: 3.1 mEq/L — ABNORMAL LOW (ref 3.5–5.1)
Sodium: 131 mEq/L — ABNORMAL LOW (ref 135–145)
Total Bilirubin: 0.2 mg/dL — ABNORMAL LOW (ref 0.3–1.2)
Total Protein: 7.3 g/dL (ref 6.0–8.3)

## 2011-06-20 LAB — URINALYSIS, ROUTINE W REFLEX MICROSCOPIC
Bilirubin Urine: NEGATIVE
Glucose, UA: NEGATIVE mg/dL
Hgb urine dipstick: NEGATIVE
Ketones, ur: 40 mg/dL — AB
Leukocytes, UA: NEGATIVE
Nitrite: NEGATIVE
Protein, ur: NEGATIVE mg/dL
Specific Gravity, Urine: 1.021 (ref 1.005–1.030)
Urobilinogen, UA: 0.2 mg/dL (ref 0.0–1.0)
pH: 6 (ref 5.0–8.0)

## 2011-06-20 LAB — DIFFERENTIAL
Basophils Absolute: 0 10*3/uL (ref 0.0–0.1)
Basophils Relative: 0 % (ref 0–1)
Eosinophils Absolute: 0.4 10*3/uL (ref 0.0–0.7)
Eosinophils Relative: 5 % (ref 0–5)
Lymphocytes Relative: 15 % (ref 12–46)
Lymphs Abs: 1.2 10*3/uL (ref 0.7–4.0)
Monocytes Absolute: 0.4 10*3/uL (ref 0.1–1.0)
Monocytes Relative: 5 % (ref 3–12)
Neutro Abs: 5.6 10*3/uL (ref 1.7–7.7)
Neutrophils Relative %: 75 % (ref 43–77)

## 2011-06-20 LAB — CBC
HCT: 41.7 % (ref 36.0–46.0)
Hemoglobin: 14.5 g/dL (ref 12.0–15.0)
MCH: 35.4 pg — ABNORMAL HIGH (ref 26.0–34.0)
MCHC: 34.8 g/dL (ref 30.0–36.0)
MCV: 101.7 fL — ABNORMAL HIGH (ref 78.0–100.0)
Platelets: 267 10*3/uL (ref 150–400)
RBC: 4.1 MIL/uL (ref 3.87–5.11)
RDW: 13.3 % (ref 11.5–15.5)
WBC: 7.5 10*3/uL (ref 4.0–10.5)

## 2011-06-20 MED ORDER — SODIUM CHLORIDE 0.9 % IV BOLUS (SEPSIS)
1000.0000 mL | Freq: Once | INTRAVENOUS | Status: AC
  Administered 2011-06-20: 1000 mL via INTRAVENOUS

## 2011-06-20 MED ORDER — POTASSIUM CHLORIDE CRYS ER 20 MEQ PO TBCR
40.0000 meq | EXTENDED_RELEASE_TABLET | Freq: Once | ORAL | Status: AC
  Administered 2011-06-20: 40 meq via ORAL
  Filled 2011-06-20: qty 2

## 2011-06-20 MED ORDER — MORPHINE SULFATE 4 MG/ML IJ SOLN
6.0000 mg | Freq: Once | INTRAMUSCULAR | Status: AC
  Administered 2011-06-20: 6 mg via INTRAVENOUS
  Filled 2011-06-20: qty 2

## 2011-06-20 MED ORDER — POTASSIUM CHLORIDE 20 MEQ/15ML (10%) PO LIQD
40.0000 meq | Freq: Once | ORAL | Status: DC
  Filled 2011-06-20: qty 30

## 2011-06-20 MED ORDER — PROMETHAZINE HCL 25 MG PO TABS: 25.0000 mg | ORAL_TABLET | Freq: Four times a day (QID) | ORAL | Status: DC | PRN

## 2011-06-20 MED ORDER — PROMETHAZINE HCL 25 MG/ML IJ SOLN
12.5000 mg | Freq: Once | INTRAMUSCULAR | Status: AC
  Administered 2011-06-20: 12.5 mg via INTRAVENOUS
  Filled 2011-06-20 (×2): qty 1

## 2011-06-20 MED ORDER — PROMETHAZINE HCL 25 MG/ML IJ SOLN
12.5000 mg | Freq: Once | INTRAMUSCULAR | Status: AC
  Administered 2011-06-20: 12.5 mg via INTRAVENOUS
  Filled 2011-06-20: qty 1

## 2011-06-20 NOTE — ED Provider Notes (Signed)
Medical screening examination/treatment/procedure(s) were performed by non-physician practitioner and as supervising physician I was immediately available for consultation/collaboration.  Doug Sou, MD 06/20/11 [phone]

## 2011-06-20 NOTE — ED Provider Notes (Signed)
History     CSN: [id number]  Arrival date & time 06/20/11  1129   First MD Initiated Contact with Patient 06/20/11 1251      Chief Complaint  Patient presents with  . Emesis    (Consider location/radiation/quality/duration/timing/severity/associated sxs/prior treatment) HPI Dawn Frost is a 46 yo with a history of "colitis" per patient which she is getting an extensive evaluation at [facility] for, currently, who presents today for evaluation of her N/V/D which she has had since her last admission.  Pt states this has never resolved and she is worried about getting electrolyte abnormalities again, for which she was admitted for last time.  She has taken 3 of the K+ supplements PO which she received from her PCP after last admission follow-up.  She states that the diarrhea is now a very watery consistency with some yellow tinge to it.  She does not have any fever, chills, blood in her stool or emesis, no chest pain or shortness of breath, although she is complaining of cramping in her leg.    Past Medical History  Diagnosis Date  . Colitis   . WPW (Wolff-Parkinson-White syndrome)   . Kidney calculi   . Kidney stone   . Addison disease   . Thyroid disease   . Clostridium difficile diarrhea   . Anxiety and depression   . Chronic diarrhea   . Chronic back pain   . Drug-seeking behavior   . Irritable bowel syndrome (IBS)   . Anemia     Past Surgical History  Procedure Date  . Cholecystectomy   . Tubal ligation   . Lithotripsy   . Sphincterotomy   . Cardiac electrophysiology mapping and ablation     for WPW    Family History  Problem Relation Age of Onset  . Colon cancer Neg Hx     History  Substance Use Topics  . Smoking status: Current Everyday Smoker -- 1.0 packs/day for 30 years    Types: Cigarettes  . Smokeless tobacco: Never Used  . Alcohol Use: No    OB History    Grav Para Term Preterm Abortions TAB SAB Ect Mult Living   3 3              Review of  Systems All pertinent positives and negatives reviewed in the history of present illness  Allergies  Nsaids and Zofran  Home Medications   Current Outpatient Rx  Name Route Sig Dispense Refill  . ACETAMINOPHEN 500 MG PO TABS Oral Take 1,000 mg by mouth every 6 (six) hours as needed. For pain    . BUDESONIDE ER 3 MG PO CP24 Oral Take 9 mg by mouth every morning. Pt takes 3 caps every morning    . CLONAZEPAM 1 MG PO TABS Oral Take 1 mg by mouth 3 (three) times daily as needed. anxiety    . HYDROCODONE-ACETAMINOPHEN 5-500 MG PO TABS Oral Take 1 tablet by mouth every 6 (six) hours as needed for pain. 15 tablet 0  . HYDROCORTISONE 20 MG PO TABS Oral Take 20 mg by mouth 2 (two) times daily.      Marland Kitchen LEVOTHYROXINE SODIUM 50 MCG PO TABS Oral Take 50 mcg by mouth daily.      . ADULT MULTIVITAMIN W/MINERALS CH Oral Take 1 tablet by mouth daily.      Marland Kitchen NICOTINE 21 MG/24HR TD PT24 Transdermal Place 1 patch onto the skin daily. Do not smoke while on the patch. 28 patch 0  . ONDANSETRON  4 MG PO TBDP Oral Take 1 tablet (4 mg total) by mouth every 8 (eight) hours as needed for nausea. 20 tablet 0  . POTASSIUM CHLORIDE CRYS ER 20 MEQ PO TBCR Oral Take 1 tablet (20 mEq total) by mouth 2 (two) times daily. 30 tablet 0  . SERTRALINE HCL 100 MG PO TABS Oral Take 100 mg by mouth daily.      Marland Kitchen UNABLE TO FIND  Please excuse Dawn Frost from any work obligations from 06/09/2011 - 06/14/2011 as she was hospitalized. Patient may return to work on 06/15/2011 without any restrictions. 1 Units 0    BP 123/89  Pulse 110  Temp(Src) 98.4 F (36.9 C) (Oral)  Resp 18  Ht 5\' 4"  (1.626 m)  Wt 133 lb (60.328 kg)  BMI 22.83 kg/m2  SpO2 99%  LMP 05/23/2011  Physical Exam  Constitutional: She is oriented to person, place, and time. She appears well-developed and well-nourished. No distress.  Cardiovascular: Normal rate, regular rhythm, normal heart sounds and intact distal pulses.   No murmur heard. Pulmonary/Chest: Effort  normal and breath sounds normal. No respiratory distress. She has no wheezes.  Abdominal: Soft. Bowel sounds are normal. She exhibits no distension. There is tenderness (diffuse). There is no rebound.  Neurological: She is alert and oriented to person, place, and time.  Skin: Skin is warm. No rash noted. She is not diaphoretic. No erythema.    ED Course  Procedures (including critical care time)      Pt seen and assessed.  Will start IV fluids and collect lab work.  The patient has been reassessed x4 and she has remained stable here. She has tolerated fluids.  MDM  MDM Reviewed: vitals, nursing note and previous chart Interpretation: labs     The patient is feeling better at this time. She will be sent home with antiemetics and advised to return here as needed. She is being evaluated for her chronic abd issues at [facility]. She is advised to return here as needed.        Carlyle Dolly, PA-C 06/20/11 1710

## 2011-06-20 NOTE — ED Notes (Signed)
Pt requesting additional pain meds. PA student made aware. Awaiting additional orders.

## 2011-06-20 NOTE — ED Notes (Signed)
Patient aware of need for urine testing. Patient unable to void at this time. Patient encouraged to call for toileting assistance  

## 2011-06-20 NOTE — ED Notes (Signed)
Patient reports that she was admitted 1 week ago for vomiting and diarrhea and the symptoms are persisting. Seen at [facility] this past Thursday and treated for same with potassium and sent home. Here for further evaluation

## 2011-06-20 NOTE — Discharge Instructions (Signed)
Return here as needed. Follow up with your doctors at [facility]. Slowly increase your fluids. Your potassium has improved.

## 2011-07-01 ENCOUNTER — Encounter (HOSPITAL_BASED_OUTPATIENT_CLINIC_OR_DEPARTMENT_OTHER): Payer: Self-pay | Admitting: Emergency Medicine

## 2011-07-01 ENCOUNTER — Emergency Department (HOSPITAL_BASED_OUTPATIENT_CLINIC_OR_DEPARTMENT_OTHER)
Admission: EM | Admit: 2011-07-01 | Discharge: 2011-07-01 | Disposition: A | Discharge status: Home / Self Care | Payer: 59 | Attending: Emergency Medicine | Admitting: Emergency Medicine

## 2011-07-01 DIAGNOSIS — F411 Generalized anxiety disorder: Secondary | ICD-10-CM | POA: Insufficient documentation

## 2011-07-01 DIAGNOSIS — F3289 Other specified depressive episodes: Secondary | ICD-10-CM | POA: Insufficient documentation

## 2011-07-01 DIAGNOSIS — F329 Major depressive disorder, single episode, unspecified: Secondary | ICD-10-CM | POA: Insufficient documentation

## 2011-07-01 DIAGNOSIS — I456 Pre-excitation syndrome: Secondary | ICD-10-CM | POA: Insufficient documentation

## 2011-07-01 DIAGNOSIS — Z87442 Personal history of urinary calculi: Secondary | ICD-10-CM | POA: Insufficient documentation

## 2011-07-01 DIAGNOSIS — R197 Diarrhea, unspecified: Secondary | ICD-10-CM

## 2011-07-01 DIAGNOSIS — K589 Irritable bowel syndrome without diarrhea: Secondary | ICD-10-CM | POA: Insufficient documentation

## 2011-07-01 DIAGNOSIS — R109 Unspecified abdominal pain: Secondary | ICD-10-CM | POA: Insufficient documentation

## 2011-07-01 DIAGNOSIS — R112 Nausea with vomiting, unspecified: Secondary | ICD-10-CM | POA: Insufficient documentation

## 2011-07-01 DIAGNOSIS — F17200 Nicotine dependence, unspecified, uncomplicated: Secondary | ICD-10-CM | POA: Insufficient documentation

## 2011-07-01 DIAGNOSIS — M549 Dorsalgia, unspecified: Secondary | ICD-10-CM | POA: Insufficient documentation

## 2011-07-01 DIAGNOSIS — G8929 Other chronic pain: Secondary | ICD-10-CM | POA: Insufficient documentation

## 2011-07-01 LAB — COMPREHENSIVE METABOLIC PANEL
ALT: 10 U/L (ref 0–35)
AST: 12 U/L (ref 0–37)
Alkaline Phosphatase: 63 U/L (ref 39–117)
CO2: 23 mEq/L (ref 19–32)
Calcium: 8.4 mg/dL (ref 8.4–10.5)
Chloride: 109 mEq/L (ref 96–112)
GFR calc Af Amer: >90 mL/min (ref >90)
GFR calc non Af Amer: >90 mL/min (ref >90)
Glucose, Bld: 95 mg/dL (ref 70–99)
Potassium: 3.6 mEq/L (ref 3.5–5.1)
Sodium: 141 mEq/L (ref 135–145)

## 2011-07-01 LAB — URINE MICROSCOPIC-ADD ON

## 2011-07-01 LAB — URINALYSIS, ROUTINE W REFLEX MICROSCOPIC
Bilirubin Urine: NEGATIVE
Glucose, UA: NEGATIVE mg/dL
Hgb urine dipstick: NEGATIVE
Ketones, ur: NEGATIVE mg/dL
Protein, ur: NEGATIVE mg/dL
pH: 6 (ref 5.0–8.0)

## 2011-07-01 LAB — CBC
Hemoglobin: 13.8 g/dL (ref 12.0–15.0)
Platelets: 256 10*3/uL (ref 150–400)
RBC: 3.89 MIL/uL (ref 3.87–5.11)
WBC: 9.2 10*3/uL (ref 4.0–10.5)

## 2011-07-01 MED ORDER — MORPHINE SULFATE 2 MG/ML IJ SOLN
INTRAMUSCULAR | Status: AC
  Administered 2011-07-01: 2 mg via INTRAVENOUS
  Filled 2011-07-01: qty 1

## 2011-07-01 MED ORDER — MORPHINE SULFATE 4 MG/ML IJ SOLN
6.0000 mg | Freq: Once | INTRAMUSCULAR | Status: AC
  Administered 2011-07-01: 6 mg via INTRAVENOUS
  Filled 2011-07-01 (×2): qty 1

## 2011-07-01 MED ORDER — PROMETHAZINE HCL 25 MG PO TABS: 25.0000 mg | ORAL_TABLET | Freq: Four times a day (QID) | ORAL | Status: DC | PRN

## 2011-07-01 MED ORDER — ONDANSETRON HCL 4 MG/2ML IJ SOLN
4.0000 mg | Freq: Once | INTRAMUSCULAR | Status: AC
  Administered 2011-07-01: 4 mg via INTRAVENOUS
  Filled 2011-07-01: qty 2

## 2011-07-01 MED ORDER — MORPHINE SULFATE 4 MG/ML IJ SOLN
6.0000 mg | Freq: Once | INTRAMUSCULAR | Status: AC
  Administered 2011-07-01: 4 mg via INTRAVENOUS
  Filled 2011-07-01: qty 1

## 2011-07-01 MED ORDER — PROMETHAZINE HCL 25 MG/ML IJ SOLN
25.0000 mg | Freq: Once | INTRAMUSCULAR | Status: AC
  Administered 2011-07-01: 25 mg via INTRAVENOUS
  Filled 2011-07-01: qty 1

## 2011-07-01 MED ORDER — TRAMADOL HCL 50 MG PO TABS: 50.0000 mg | ORAL_TABLET | Freq: Four times a day (QID) | ORAL | Status: AC | PRN

## 2011-07-01 MED ORDER — SODIUM CHLORIDE 0.9 % IV BOLUS (SEPSIS)
1000.0000 mL | Freq: Once | INTRAVENOUS | Status: AC
  Administered 2011-07-01: 1000 mL via INTRAVENOUS

## 2011-07-01 MED ORDER — PROMETHAZINE HCL 25 MG PO TABS
ORAL_TABLET | ORAL | Status: AC
  Filled 2011-07-01: qty 1

## 2011-07-01 NOTE — ED Notes (Signed)
Pt given a couple sips of Sprite with ice to see if she tolerated it. No vomit, but she's still feeling nauseous.

## 2011-07-01 NOTE — ED Notes (Signed)
Nausea, vomting, and diarrhea that started Wednesday. Vomited x6 in the past 24 hours. Bad cramping in the LLQ of abdomen that radiates to the back. History of colitis. Vomit is yellow and watery.

## 2011-07-01 NOTE — Discharge Instructions (Signed)
Return to the ED with any concerns including vomiting and not able to keep down any liquids, worsening abdominal pain, fever over 102, decrease level of alertness/lethargy, or any other alarming symptoms

## 2011-07-01 NOTE — ED Provider Notes (Signed)
History     CSN: [id number]  Arrival date & time 07/01/11  1027   First MD Initiated Contact with Patient 07/01/11 1050      Chief Complaint  Patient presents with  . Emesis  . Diarrhea    (Consider location/radiation/quality/duration/timing/severity/associated sxs/prior treatment) HPI Pt with hx of colitis and chronic abdominal pain and v/d undergoing workup at [facility] and now has been referred to [facility] presents with abdominal pain, vomiting and diarrhea.  Pt was seen in the ED 3/8 and prior to that had an admission for hypokalemia associated with her symptoms.  She states that she has been taking potassium supplements as directed, had been feeling improved after her last ED visit until 2 days ago.  Then began to have vomiting and multiple watery stools.  Emesis nonbloody, nonbilious.  Abdominal pain is located in left mid abdomen primarily.  No fever/chills.  States she is not able to keep down liquids today.  There are no other associated systemic symptoms, there are no alleviating or modifying factos.  She states she has run out of phenergan.    Past Medical History  Diagnosis Date  . Colitis   . WPW (Wolff-Parkinson-White syndrome)   . Kidney calculi   . Kidney stone   . Addison disease   . Thyroid disease   . Clostridium difficile diarrhea   . Anxiety and depression   . Chronic diarrhea   . Chronic back pain   . Drug-seeking behavior   . Irritable bowel syndrome (IBS)   . Anemia   . Colitis   . Addison's disease     Past Surgical History  Procedure Date  . Cholecystectomy   . Tubal ligation   . Lithotripsy   . Sphincterotomy   . Cardiac electrophysiology mapping and ablation     for WPW    Family History  Problem Relation Age of Onset  . Colon cancer Neg Hx     History  Substance Use Topics  . Smoking status: Current Everyday Smoker -- 1.0 packs/day for 30 years    Types: Cigarettes  . Smokeless tobacco: Never Used  . Alcohol Use: No    OB History    Grav Para Term Preterm Abortions TAB SAB Ect Mult Living   3 3              Review of Systems ROS reviewed and all otherwise negative except for mentioned in HPI  Allergies  Nsaids and Zofran  Home Medications   Current Outpatient Rx  Name Route Sig Dispense Refill  . ACETAMINOPHEN 500 MG PO TABS Oral Take 1,000 mg by mouth every 6 (six) hours as needed. For pain    . BUDESONIDE ER 3 MG PO CP24 Oral Take 9 mg by mouth every morning. Pt takes 3 caps every morning    . CLONAZEPAM 1 MG PO TABS Oral Take 1 mg by mouth 4 (four) times daily as needed. anxiety    . HYDROCORTISONE 20 MG PO TABS Oral Take 20 mg by mouth 2 (two) times daily.      Marland Kitchen LEVOTHYROXINE SODIUM 50 MCG PO TABS Oral Take 50 mcg by mouth daily.      . ADULT MULTIVITAMIN W/MINERALS CH Oral Take 1 tablet by mouth daily.      Marland Kitchen POTASSIUM CHLORIDE CRYS ER 20 MEQ PO TBCR Oral Take 1 tablet (20 mEq total) by mouth 2 (two) times daily. 30 tablet 0  . PROMETHAZINE HCL 25 MG RE SUPP Rectal Place 1  suppository (25 mg total) rectally every 6 (six) hours as needed for nausea. 12 each 0  . PROMETHAZINE HCL 25 MG PO TABS Oral Take 1 tablet (25 mg total) by mouth every 6 (six) hours as needed for nausea. 15 tablet 0  . PROMETHAZINE HCL 25 MG PO TABS Oral Take 1 tablet (25 mg total) by mouth every 6 (six) hours as needed for nausea. 30 tablet 0  . PROMETHAZINE HCL 25 MG PO TABS Oral Take 1 tablet (25 mg total) by mouth every 6 (six) hours as needed for nausea. 8 tablet 0  . PROMETHAZINE HCL 25 MG PO TABS Oral Take 1 tablet (25 mg total) by mouth every 6 (six) hours as needed for nausea. 30 tablet 0  . PROMETHAZINE HCL 25 MG PO TABS Oral Take 1 tablet (25 mg total) by mouth every 6 (six) hours as needed for nausea. 12 tablet 0  . PROMETHAZINE HCL 25 MG PO TABS Oral Take 1 tablet (25 mg total) by mouth every 6 (six) hours as needed for nausea. 15 tablet 0  . PROMETHAZINE HCL 25 MG PO TABS Oral Take 1 tablet (25 mg total) by mouth every 6 (six)  hours as needed for nausea. 10 tablet 0  . PROMETHAZINE HCL 25 MG PO TABS Oral Take 1 tablet (25 mg total) by mouth every 6 (six) hours as needed for nausea. 20 tablet 0  . SERTRALINE HCL 100 MG PO TABS Oral Take 100 mg by mouth daily.      . TRAMADOL HCL 50 MG PO TABS Oral Take 1 tablet (50 mg total) by mouth every 6 (six) hours as needed for pain. 15 tablet 0  . UNABLE TO FIND  Please excuse Ms. Temples from any work obligations from 06/09/2011 - 06/14/2011 as she was hospitalized. Patient may return to work on 06/15/2011 without any restrictions. 1 Units 0    BP 139/81  Pulse 68  Temp(Src) 98.2 F (36.8 C) (Oral)  Resp 20  SpO2 100%  LMP 05/23/2011 Vitals reviewed  Physical Exam Physical Examination: General appearance - alert, well appearing, and in no distress Mental status - alert, oriented to person, place, and time Eyes - pupils equal and reactive, no scleral icterus, no conjunctival injection Mouth - mucous membranes moist, pharynx normal without lesions Chest - clear to auscultation, no wheezes, rales or rhonchi, symmetric air entry Heart - normal rate, regular rhythm, normal S1, S2, no murmurs, rubs, clicks or gallops Abdomen - soft, diffusely tender to palpation, NABS,  nondistended, no masses or organomegaly Extremities - peripheral pulses normal, no pedal edema, no clubbing or cyanosis Skin - normal coloration and turgor, no rashes, brisk cap refill  ED Course  Procedures (including critical care time)  2:28 PM Pt feeling improved after fluids, pain meds, antiemetics.  She is drinking liquids now.  She has had no vomiting in the ED.    Labs Reviewed  URINALYSIS, ROUTINE W REFLEX MICROSCOPIC - Abnormal; Notable for the following:    APPearance CLOUDY (*)    Leukocytes, UA SMALL (*)    All other components within normal limits  URINE MICROSCOPIC-ADD ON - Abnormal; Notable for the following:    Squamous Epithelial / LPF FEW (*)    Bacteria, UA MANY (*)    All other  components within normal limits  CBC - Abnormal; Notable for the following:    MCV 102.6 (*)    MCH 35.5 (*)    All other components within normal limits  PREGNANCY, URINE  COMPREHENSIVE METABOLIC PANEL  LIPASE, BLOOD   No results found.   1. Nausea vomiting and diarrhea   2. Abdominal pain       MDM  Pt presenting with nausea/vomiting/diarrhea/abdominal pain similar to her multiple prior episodes of similar symptoms.  Pt has been undergoing workup at [facility] for colitis and states she has now been referred to [facility]- has an appointment next month.  Her labs are reassuring, she has received IV hydration, pain control, antiemetics- she felt improved prior to discharge.  She was discharged with phenergan rx.  Encouraged to f/u with her doctors on an outpatient basis.  Strict return precautions were discussed, she is agreeable with this plan.  Prior records reviewed and considered during this visit        Ethelda Chick, MD 07/04/11 [phone]

## 2011-07-24 ENCOUNTER — Emergency Department (INDEPENDENT_AMBULATORY_CARE_PROVIDER_SITE_OTHER): Payer: 59

## 2011-07-24 ENCOUNTER — Emergency Department (HOSPITAL_BASED_OUTPATIENT_CLINIC_OR_DEPARTMENT_OTHER)
Admission: EM | Admit: 2011-07-24 | Discharge: 2011-07-24 | Disposition: A | Discharge status: Home / Self Care | Payer: 59 | Attending: Emergency Medicine | Admitting: Emergency Medicine

## 2011-07-24 ENCOUNTER — Encounter (HOSPITAL_BASED_OUTPATIENT_CLINIC_OR_DEPARTMENT_OTHER): Payer: Self-pay | Admitting: *Deleted

## 2011-07-24 DIAGNOSIS — R1310 Dysphagia, unspecified: Secondary | ICD-10-CM | POA: Insufficient documentation

## 2011-07-24 DIAGNOSIS — K589 Irritable bowel syndrome without diarrhea: Secondary | ICD-10-CM | POA: Insufficient documentation

## 2011-07-24 DIAGNOSIS — R0602 Shortness of breath: Secondary | ICD-10-CM

## 2011-07-24 DIAGNOSIS — R1013 Epigastric pain: Secondary | ICD-10-CM | POA: Insufficient documentation

## 2011-07-24 DIAGNOSIS — M549 Dorsalgia, unspecified: Secondary | ICD-10-CM | POA: Insufficient documentation

## 2011-07-24 DIAGNOSIS — R0609 Other forms of dyspnea: Secondary | ICD-10-CM | POA: Insufficient documentation

## 2011-07-24 DIAGNOSIS — G8929 Other chronic pain: Secondary | ICD-10-CM | POA: Insufficient documentation

## 2011-07-24 DIAGNOSIS — F341 Dysthymic disorder: Secondary | ICD-10-CM | POA: Insufficient documentation

## 2011-07-24 DIAGNOSIS — R079 Chest pain, unspecified: Secondary | ICD-10-CM | POA: Insufficient documentation

## 2011-07-24 DIAGNOSIS — R0989 Other specified symptoms and signs involving the circulatory and respiratory systems: Secondary | ICD-10-CM | POA: Insufficient documentation

## 2011-07-24 DIAGNOSIS — E079 Disorder of thyroid, unspecified: Secondary | ICD-10-CM | POA: Insufficient documentation

## 2011-07-24 DIAGNOSIS — E2749 Other adrenocortical insufficiency: Secondary | ICD-10-CM | POA: Insufficient documentation

## 2011-07-24 DIAGNOSIS — R110 Nausea: Secondary | ICD-10-CM | POA: Insufficient documentation

## 2011-07-24 DIAGNOSIS — I456 Pre-excitation syndrome: Secondary | ICD-10-CM | POA: Insufficient documentation

## 2011-07-24 DIAGNOSIS — Z79899 Other long term (current) drug therapy: Secondary | ICD-10-CM | POA: Insufficient documentation

## 2011-07-24 DIAGNOSIS — K219 Gastro-esophageal reflux disease without esophagitis: Secondary | ICD-10-CM | POA: Insufficient documentation

## 2011-07-24 DIAGNOSIS — R61 Generalized hyperhidrosis: Secondary | ICD-10-CM | POA: Insufficient documentation

## 2011-07-24 LAB — CBC
MCH: 36.9 pg — ABNORMAL HIGH (ref 26.0–34.0)
Platelets: 250 10*3/uL (ref 150–400)
RBC: 3.71 MIL/uL — ABNORMAL LOW (ref 3.87–5.11)
RDW: 13.5 % (ref 11.5–15.5)
WBC: 8.7 10*3/uL (ref 4.0–10.5)

## 2011-07-24 LAB — COMPREHENSIVE METABOLIC PANEL
ALT: 10 U/L (ref 0–35)
AST: 13 U/L (ref 0–37)
Albumin: 4 g/dL (ref 3.5–5.2)
CO2: 22 mEq/L (ref 19–32)
Calcium: 9.4 mg/dL (ref 8.4–10.5)
Chloride: 107 mEq/L (ref 96–112)
GFR calc non Af Amer: 88 mL/min — ABNORMAL LOW (ref >90)
Sodium: 141 mEq/L (ref 135–145)

## 2011-07-24 LAB — TROPONIN I: Troponin I: <0.3 ng/mL (ref <0.30)

## 2011-07-24 MED ORDER — MORPHINE SULFATE 4 MG/ML IJ SOLN
4.0000 mg | Freq: Once | INTRAMUSCULAR | Status: AC
  Administered 2011-07-24: 4 mg via INTRAVENOUS
  Filled 2011-07-24: qty 1

## 2011-07-24 MED ORDER — ONDANSETRON HCL 4 MG/2ML IJ SOLN
4.0000 mg | Freq: Once | INTRAMUSCULAR | Status: AC
  Administered 2011-07-24: 4 mg via INTRAVENOUS
  Filled 2011-07-24: qty 2

## 2011-07-24 MED ORDER — GI COCKTAIL ~~LOC~~
30.0000 mL | Freq: Once | ORAL | Status: AC
  Administered 2011-07-24: 30 mL via ORAL
  Filled 2011-07-24: qty 30

## 2011-07-24 MED ORDER — OXYCODONE-ACETAMINOPHEN 5-325 MG PO TABS: 1.0000 | ORAL_TABLET | Freq: Four times a day (QID) | ORAL | Status: DC | PRN

## 2011-07-24 MED ORDER — PANTOPRAZOLE SODIUM 40 MG IV SOLR
40.0000 mg | Freq: Once | INTRAVENOUS | Status: AC
  Administered 2011-07-24: 40 mg via INTRAVENOUS
  Filled 2011-07-24: qty 40

## 2011-07-24 MED ORDER — RANITIDINE HCL 150 MG PO CAPS: 150.0000 mg | ORAL_CAPSULE | Freq: Every day | ORAL | Status: DC

## 2011-07-24 MED ORDER — SODIUM CHLORIDE 0.9 % IV BOLUS (SEPSIS)
1000.0000 mL | Freq: Once | INTRAVENOUS | Status: AC
  Administered 2011-07-24: 1000 mL via INTRAVENOUS

## 2011-07-24 NOTE — ED Notes (Signed)
I helped patient to restroom and back, placed her back onto monitor, patient gave urine sample which is at bedside in case needed.

## 2011-07-24 NOTE — ED Notes (Signed)
MD at bedside. 

## 2011-07-24 NOTE — ED Provider Notes (Signed)
History   This chart was scribed for Dawn Chick, MD scribed by Magnus Sinning. The patient was seen in room MH04/MH04 seen at 15:41.   CSN: [id number]  Arrival date & time 07/24/11  1504   First MD Initiated Contact with Patient 07/24/11 1530      Chief Complaint  Patient presents with  . Chest Pain    (Consider location/radiation/quality/duration/timing/severity/associated sxs/prior treatment) HPI Dawn Frost is a 46 y.o. female who presents to the Emergency Department complaining of intermittent moderate CP, onset 2 days with associated difficulty breathing during pain episode,  pain with swallowing, tenderness to upper abdomen, nausea, and diaphoresis. Describes CP as feeling like painful tight fullness in her chest and throat. Pt doesn't suspect it is her colitis, saying the sxs are not similar.  Adds she's been told that she has Barrett's esophagus, but has never had sxs. Pt says she does not take any GERD medications, and denies any recent surgeries, or recent trips.  Reports that nothing makes pain worse and that pain can last between 10 minutes to 45 minutes.Currently taking Anucort and has hx of cholecystectomy. GI: Berton Lan  Past Medical History  Diagnosis Date  . Colitis   . WPW (Wolff-Parkinson-White syndrome)   . Kidney calculi   . Kidney stone   . Addison disease   . Thyroid disease   . Clostridium difficile diarrhea   . Anxiety and depression   . Chronic diarrhea   . Chronic back pain   . Drug-seeking behavior   . Irritable bowel syndrome (IBS)   . Anemia   . Colitis   . Addison's disease     Past Surgical History  Procedure Date  . Cholecystectomy   . Tubal ligation   . Lithotripsy   . Sphincterotomy   . Cardiac electrophysiology mapping and ablation     for WPW    Family History  Problem Relation Age of Onset  . Colon cancer Neg Hx     History  Substance Use Topics  . Smoking status: Current Everyday Smoker -- 1.0 packs/day for 30 years      Types: Cigarettes  . Smokeless tobacco: Never Used  . Alcohol Use: No    OB History    Grav Para Term Preterm Abortions TAB SAB Ect Mult Living   3 3              Review of Systems 10 Systems reviewed and are negative for acute change except as noted in the HPI. Allergies  Nsaids and Zofran  Home Medications   Current Outpatient Rx  Name Route Sig Dispense Refill  . BUDESONIDE ER 3 MG PO CP24 Oral Take 9 mg by mouth every morning.     Marland Kitchen CLONAZEPAM 1 MG PO TABS Oral Take 1 mg by mouth 4 (four) times daily as needed. anxiety    . HYDROCORTISONE 20 MG PO TABS Oral Take 20 mg by mouth 2 (two) times daily.      Marland Kitchen LEVOTHYROXINE SODIUM 50 MCG PO TABS Oral Take 50 mcg by mouth daily.      . ADULT MULTIVITAMIN W/MINERALS CH Oral Take 1 tablet by mouth daily.      Marland Kitchen NORTRIPTYLINE HCL 25 MG PO CAPS Oral Take 25 mg by mouth at bedtime.    Marland Kitchen POTASSIUM CHLORIDE CRYS ER 20 MEQ PO TBCR Oral Take 1 tablet (20 mEq total) by mouth 2 (two) times daily. 30 tablet 0  . OXYCODONE-ACETAMINOPHEN 5-325 MG PO TABS Oral Take 1-2  tablets by mouth every 6 (six) hours as needed for pain. 15 tablet 0  . RANITIDINE HCL 150 MG PO CAPS Oral Take 1 capsule (150 mg total) by mouth daily. 30 capsule 0    BP 125/96  Pulse 54  Temp(Src) 97.4 F (36.3 C) (Oral)  Resp 20  Ht 5\' 4"  (1.626 m)  Wt 126 lb (57.153 kg)  BMI 21.63 kg/m2  SpO2 100%  LMP 07/13/2011  Physical Exam  Nursing note and vitals reviewed. Constitutional: She is oriented to person, place, and time. She appears well-developed and well-nourished. No distress.  HENT:  Head: Normocephalic and atraumatic.  Eyes: EOM are normal. Pupils are equal, round, and reactive to light.  Neck: Neck supple. No tracheal deviation present.  Cardiovascular: Normal rate.   Pulmonary/Chest: Effort normal. No respiratory distress.  Abdominal: Soft. She exhibits no distension. There is tenderness.       Mild epigastric tenderness  Musculoskeletal: Normal range  of motion. She exhibits no edema.  Neurological: She is alert and oriented to person, place, and time. No sensory deficit.  Skin: Skin is warm and dry.  Psychiatric: She has a normal mood and affect. Her behavior is normal.    ED Course  Procedures (including critical care time)   Date: 07/24/2011  Rate: 125  Rhythm: sinus tachycardia  QRS Axis: normal  Intervals: normal  ST/T Wave abnormalities: normal  Conduction Disutrbances:none  Narrative Interpretation:   Old EKG Reviewed: rate increased compared to prior ekg of 06/20/11   DIAGNOSTIC STUDIES: Oxygen Saturation is 100% on room air, normal by my interpretation.    COORDINATION OF CARE:  Medication Orders 1600: Sodium chloride 0.9 % bolus 1,000 mL Once  GI Cocktail Once  Labs Reviewed  CBC - Abnormal; Notable for the following:    RBC 3.71 (*)    MCV 102.4 (*)    MCH 36.9 (*)    MCHC 36.1 (*)    All other components within normal limits  COMPREHENSIVE METABOLIC PANEL - Abnormal; Notable for the following:    Glucose, Bld 107 (*)    GFR calc non Af Amer 88 (*)    All other components within normal limits  LIPASE, BLOOD  D-DIMER, QUANTITATIVE  TROPONIN I  TROPONIN I   Dg Chest 2 View  07/24/2011  *RADIOLOGY REPORT*  Clinical Data: Chest pain and shortness of breath  CHEST - 2 VIEW  Comparison: 05/01/2011  Findings: Cardiomediastinal silhouette is within normal limits. The lungs are clear. No pleural effusion.  No pneumothorax.  No acute osseous abnormality. Cholecystectomy clips noted.  IMPRESSION: Normal chest.  Original Report Authenticated By: Harrel Lemon, M.D.     1. Chest pain   2. Reflux       MDM  Patient presents with complaint of midsternal chest pain which has been intermittent over the past several days. She states it feels like a burning and a squeezing feeling it is worse when she swallows she also has some epigastric discomfort. Patient has a reassuring EKG as well as 2 sets of cardiac  enzymes that are normal. The pain was relieved temporarily with a GI cocktail and I suspect that this is more related to reflux type of pain possibly esophageal spasm. She has no cardiac risk factors and in combination with a reassuring EKG and 2 sets of markers I have a very low suspicion for acute coronary syndrome. Her d-dimer was also obtained to screen for pulmonary embolism and this was within normal limits. Patient  was discharged with strict return precautions and she is agreeable with this plan.  I personally performed the services described in this documentation, which was scribed in my presence. The recorded information has been reviewed and considered.         Dawn Chick, MD 07/28/11 [phone]

## 2011-07-24 NOTE — ED Notes (Signed)
Pt states she has had midsternal CP off and on since Friday. Today was getting ready for work and had sudden onset of "feeling like someone was sitting on her chest, like a roll of paper towels was stuck down there" +SHOB. Denies other s/s. Hx ablation 18 yrs ago and Barrett's esophagus as well as colitis. Taken to ED4 and EKG done.

## 2011-07-24 NOTE — Discharge Instructions (Signed)
Return to the ED with any concerns including difficulty breathing, vomiting and not able to keep down liquids, fever over 101, decreased level of alertness or lethargy, or any other alarming symptoms.

## 2011-08-03 ENCOUNTER — Encounter (HOSPITAL_COMMUNITY): Payer: Self-pay | Admitting: *Deleted

## 2011-08-03 ENCOUNTER — Emergency Department (HOSPITAL_COMMUNITY)
Admission: EM | Admit: 2011-08-03 | Discharge: 2011-08-04 | Disposition: A | Discharge status: Home / Self Care | Payer: 59 | Attending: Emergency Medicine | Admitting: Emergency Medicine

## 2011-08-03 DIAGNOSIS — F341 Dysthymic disorder: Secondary | ICD-10-CM | POA: Insufficient documentation

## 2011-08-03 DIAGNOSIS — R112 Nausea with vomiting, unspecified: Secondary | ICD-10-CM | POA: Insufficient documentation

## 2011-08-03 DIAGNOSIS — M549 Dorsalgia, unspecified: Secondary | ICD-10-CM | POA: Insufficient documentation

## 2011-08-03 DIAGNOSIS — R197 Diarrhea, unspecified: Secondary | ICD-10-CM

## 2011-08-03 DIAGNOSIS — E079 Disorder of thyroid, unspecified: Secondary | ICD-10-CM | POA: Insufficient documentation

## 2011-08-03 DIAGNOSIS — E2749 Other adrenocortical insufficiency: Secondary | ICD-10-CM | POA: Insufficient documentation

## 2011-08-03 DIAGNOSIS — K589 Irritable bowel syndrome without diarrhea: Secondary | ICD-10-CM | POA: Insufficient documentation

## 2011-08-03 DIAGNOSIS — R319 Hematuria, unspecified: Secondary | ICD-10-CM | POA: Insufficient documentation

## 2011-08-03 DIAGNOSIS — R1084 Generalized abdominal pain: Secondary | ICD-10-CM | POA: Insufficient documentation

## 2011-08-03 DIAGNOSIS — E876 Hypokalemia: Secondary | ICD-10-CM | POA: Insufficient documentation

## 2011-08-03 DIAGNOSIS — G8929 Other chronic pain: Secondary | ICD-10-CM | POA: Insufficient documentation

## 2011-08-03 DIAGNOSIS — R300 Dysuria: Secondary | ICD-10-CM | POA: Insufficient documentation

## 2011-08-03 DIAGNOSIS — Z79899 Other long term (current) drug therapy: Secondary | ICD-10-CM | POA: Insufficient documentation

## 2011-08-03 LAB — COMPREHENSIVE METABOLIC PANEL
ALT: 10 U/L (ref 0–35)
Alkaline Phosphatase: 88 U/L (ref 39–117)
BUN: 10 mg/dL (ref 6–23)
CO2: 24 mEq/L (ref 19–32)
Chloride: 104 mEq/L (ref 96–112)
GFR calc Af Amer: >90 mL/min (ref >90)
GFR calc non Af Amer: >90 mL/min (ref >90)
Glucose, Bld: 95 mg/dL (ref 70–99)
Potassium: 2.9 mEq/L — ABNORMAL LOW (ref 3.5–5.1)
Sodium: 139 mEq/L (ref 135–145)
Total Bilirubin: 0.2 mg/dL — ABNORMAL LOW (ref 0.3–1.2)

## 2011-08-03 LAB — CBC
Hemoglobin: 11.8 g/dL — ABNORMAL LOW (ref 12.0–15.0)
MCH: 35 pg — ABNORMAL HIGH (ref 26.0–34.0)
Platelets: 254 10*3/uL (ref 150–400)
RBC: 3.37 MIL/uL — ABNORMAL LOW (ref 3.87–5.11)
WBC: 9.4 10*3/uL (ref 4.0–10.5)

## 2011-08-03 LAB — DIFFERENTIAL
Eosinophils Absolute: 0.1 10*3/uL (ref 0.0–0.7)
Lymphocytes Relative: 16 % (ref 12–46)
Lymphs Abs: 1.5 10*3/uL (ref 0.7–4.0)
Monocytes Relative: 4 % (ref 3–12)
Neutro Abs: 7.4 10*3/uL (ref 1.7–7.7)
Neutrophils Relative %: 79 % — ABNORMAL HIGH (ref 43–77)

## 2011-08-03 LAB — URINALYSIS, ROUTINE W REFLEX MICROSCOPIC
Bilirubin Urine: NEGATIVE
Hgb urine dipstick: NEGATIVE
Ketones, ur: NEGATIVE mg/dL
Nitrite: NEGATIVE
Specific Gravity, Urine: 1.023 (ref 1.005–1.030)
Urobilinogen, UA: 0.2 mg/dL (ref 0.0–1.0)

## 2011-08-03 LAB — POCT I-STAT, CHEM 8
BUN: 4 mg/dL — ABNORMAL LOW (ref 6–23)
Creatinine, Ser: 0.6 mg/dL (ref 0.50–1.10)
Glucose, Bld: 87 mg/dL (ref 70–99)
Sodium: 141 mEq/L (ref 135–145)
TCO2: 21 mmol/L (ref 0–100)

## 2011-08-03 LAB — LIPASE, BLOOD: Lipase: 13 U/L (ref 11–59)

## 2011-08-03 MED ORDER — POTASSIUM CHLORIDE CRYS ER 20 MEQ PO TBCR
40.0000 meq | EXTENDED_RELEASE_TABLET | Freq: Once | ORAL | Status: AC
  Administered 2011-08-03: 40 meq via ORAL
  Filled 2011-08-03: qty 2

## 2011-08-03 MED ORDER — METOCLOPRAMIDE HCL 5 MG/ML IJ SOLN
10.0000 mg | Freq: Once | INTRAMUSCULAR | Status: AC
  Administered 2011-08-03: 10 mg via INTRAVENOUS
  Filled 2011-08-03: qty 2

## 2011-08-03 MED ORDER — HYDROMORPHONE HCL PF 1 MG/ML IJ SOLN
1.0000 mg | Freq: Once | INTRAMUSCULAR | Status: AC
  Administered 2011-08-03: 1 mg via INTRAVENOUS
  Filled 2011-08-03: qty 1

## 2011-08-03 MED ORDER — OXYCODONE-ACETAMINOPHEN 5-325 MG PO TABS
2.0000 | ORAL_TABLET | Freq: Once | ORAL | Status: AC
  Administered 2011-08-03: 2 via ORAL
  Filled 2011-08-03: qty 2

## 2011-08-03 MED ORDER — PROMETHAZINE HCL 25 MG/ML IJ SOLN
25.0000 mg | INTRAMUSCULAR | Status: AC
  Administered 2011-08-03: 25 mg via INTRAMUSCULAR
  Filled 2011-08-03 (×2): qty 1

## 2011-08-03 MED ORDER — SODIUM CHLORIDE 0.9 % IV BOLUS (SEPSIS)
1000.0000 mL | Freq: Once | INTRAVENOUS | Status: AC
  Administered 2011-08-03: 1000 mL via INTRAVENOUS

## 2011-08-03 MED ORDER — POTASSIUM CHLORIDE 10 MEQ/100ML IV SOLN
10.0000 meq | INTRAVENOUS | Status: AC
  Administered 2011-08-03 (×3): 10 meq via INTRAVENOUS
  Filled 2011-08-03 (×3): qty 100

## 2011-08-03 MED ORDER — DIPHENOXYLATE-ATROPINE 2.5-0.025 MG PO TABS
1.0000 | ORAL_TABLET | Freq: Once | ORAL | Status: AC
  Administered 2011-08-03: 1 via ORAL
  Filled 2011-08-03: qty 1

## 2011-08-03 NOTE — ED Notes (Signed)
Vanice Sarah, RN request I-stat be drawn once bolus is complete.

## 2011-08-03 NOTE — ED Provider Notes (Signed)
History     CSN: [id number]  Arrival date & time 08/03/11  1736   First MD Initiated Contact with Patient 08/03/11 1825      Chief Complaint  Patient presents with  . Abnormal Lab    K+ 3-n/v/d    (Consider location/radiation/quality/duration/timing/severity/associated sxs/prior treatment) HPI Comments: Was diagnosed recently with a kidney stone. So her primary care doctor yesterday where labs were drawn. Was called back today and instructed to come to the emergency department for low potassium. She's been unable to tolerate her oral potassium.  Patient is a 46 y.o. female presenting with diarrhea. The history is provided by the patient. No language interpreter was used.  Diarrhea The primary symptoms include abdominal pain, nausea, vomiting, diarrhea and dysuria. Primary symptoms do not include fever, weight loss, fatigue, hematemesis, hematochezia, myalgias or arthralgias. The illness began 3 to 5 days ago. The onset was gradual. The problem has been gradually worsening.  The abdominal pain began more than 2 days ago. The abdominal pain has been unchanged since its onset. The abdominal pain is generalized. The abdominal pain does not radiate. The abdominal pain is relieved by nothing.  Nausea began 3 to 5 days ago. The nausea is associated with eating. The nausea is exacerbated by food.  The vomiting began more than 2 days ago. Vomiting occurs 2 to 5 times per day. The emesis contains stomach contents.  The diarrhea began 3 to 5 days ago. The diarrhea is watery. The diarrhea occurs 2 to 4 times per day.  The dysuria is associated with hematuria. The dysuria is not associated with frequency or urgency.  The illness does not include constipation or back pain.    Past Medical History  Diagnosis Date  . Colitis   . WPW (Wolff-Parkinson-White syndrome)   . Kidney calculi   . Kidney stone   . Addison disease   . Thyroid disease   . Clostridium difficile diarrhea   . Anxiety and  depression   . Chronic diarrhea   . Chronic back pain   . Drug-seeking behavior   . Irritable bowel syndrome (IBS)   . Anemia   . Colitis   . Addison's disease     Past Surgical History  Procedure Date  . Cholecystectomy   . Tubal ligation   . Lithotripsy   . Sphincterotomy   . Cardiac electrophysiology mapping and ablation     for WPW    Family History  Problem Relation Age of Onset  . Colon cancer Neg Hx     History  Substance Use Topics  . Smoking status: Current Everyday Smoker -- 1.0 packs/day for 30 years    Types: Cigarettes  . Smokeless tobacco: Never Used  . Alcohol Use: No    OB History    Grav Para Term Preterm Abortions TAB SAB Ect Mult Living   3 3              Review of Systems  Constitutional: Negative for fever, weight loss, activity change, appetite change and fatigue.  HENT: Negative for congestion, sore throat, rhinorrhea, neck pain and neck stiffness.   Respiratory: Negative for cough and shortness of breath.   Cardiovascular: Negative for chest pain and palpitations.  Gastrointestinal: Positive for nausea, vomiting, abdominal pain and diarrhea. Negative for constipation, blood in stool, hematochezia and hematemesis.  Genitourinary: Positive for dysuria, hematuria and flank pain. Negative for urgency and frequency.  Musculoskeletal: Negative for myalgias, back pain and arthralgias.  Neurological: Negative for dizziness,  weakness, light-headedness, numbness and headaches.  All other systems reviewed and are negative.    Allergies  Nsaids and Zofran  Home Medications   Current Outpatient Rx  Name Route Sig Dispense Refill  . BUDESONIDE ER 3 MG PO CP24 Oral Take 9 mg by mouth every morning.     Marland Kitchen CLONAZEPAM 1 MG PO TABS Oral Take 1 mg by mouth 4 (four) times daily as needed. anxiety    . HYDROCORTISONE 20 MG PO TABS Oral Take 20 mg by mouth 2 (two) times daily.      Marland Kitchen LEVOTHYROXINE SODIUM 50 MCG PO TABS Oral Take 50 mcg by mouth daily.       . ADULT MULTIVITAMIN W/MINERALS CH Oral Take 1 tablet by mouth daily.      Marland Kitchen NORTRIPTYLINE HCL 25 MG PO CAPS Oral Take 25 mg by mouth at bedtime.    Marland Kitchen POTASSIUM CHLORIDE CRYS ER 20 MEQ PO TBCR Oral Take 1 tablet (20 mEq total) by mouth 2 (two) times daily. 30 tablet 0  . RANITIDINE HCL 150 MG PO CAPS Oral Take 1 capsule (150 mg total) by mouth daily. 30 capsule 0  . DIPHENOXYLATE-ATROPINE 2.5-0.025 MG PO TABS Oral Take 1 tablet by mouth 4 (four) times daily as needed for diarrhea or loose stools. 10 tablet 0  . OXYCODONE-ACETAMINOPHEN 5-325 MG PO TABS Oral Take 1-2 tablets by mouth every 6 (six) hours as needed for pain. 20 tablet 0  . PROMETHAZINE HCL 25 MG RE SUPP Rectal Place 1 suppository (25 mg total) rectally every 6 (six) hours as needed for nausea. 12 each 0    BP 130/84  Pulse 90  Temp(Src) 97.8 F (36.6 C) (Oral)  Resp 20  SpO2 100%  LMP 07/13/2011  Physical Exam  Nursing note and vitals reviewed. Constitutional: She is oriented to person, place, and time. She appears well-developed and well-nourished.       tearful  HENT:  Head: Normocephalic and atraumatic.  Mouth/Throat: Oropharynx is clear and moist.  Eyes: Conjunctivae and EOM are normal. Pupils are equal, round, and reactive to light.  Neck: Normal range of motion. Neck supple.  Cardiovascular: Normal rate, regular rhythm, normal heart sounds and intact distal pulses.  Exam reveals no gallop and no friction rub.   No murmur heard. Pulmonary/Chest: Effort normal and breath sounds normal. No respiratory distress. She exhibits no tenderness.  Abdominal: Soft. Bowel sounds are normal. There is tenderness. There is no rebound and no guarding.       No cva tenderness on palpation  Musculoskeletal: Normal range of motion. She exhibits no tenderness.  Neurological: She is alert and oriented to person, place, and time. No cranial nerve deficit.  Skin: Skin is warm and dry. No rash noted.    ED Course  Procedures  (including critical care time)   Date: 08/03/2011  Rate: 62  Rhythm: normal sinus rhythm  QRS Axis: normal  Intervals: normal  ST/T Wave abnormalities: normal  Conduction Disutrbances:none  Narrative Interpretation:   Old EKG Reviewed: no changes  Labs Reviewed  CBC - Abnormal; Notable for the following:    RBC 3.37 (*)    Hemoglobin 11.8 (*)    MCV 106.8 (*)    MCH 35.0 (*)    All other components within normal limits  DIFFERENTIAL - Abnormal; Notable for the following:    Neutrophils Relative 79 (*)    All other components within normal limits  COMPREHENSIVE METABOLIC PANEL - Abnormal; Notable for the following:  Potassium 2.9 (*)    Total Bilirubin 0.2 (*)    All other components within normal limits  POCT I-STAT, CHEM 8 - Abnormal; Notable for the following:    Potassium 3.3 (*)    BUN 4 (*)    Hemoglobin 10.5 (*)    HCT 31.0 (*)    All other components within normal limits  LIPASE, BLOOD  URINALYSIS, ROUTINE W REFLEX MICROSCOPIC   No results found.   1. Abdominal pain   2. Hypokalemia   3. Diarrhea       MDM  Hypokalemia secondary to diarrhea. She received a dose of Lomotil emergency department as well as multiple doses of pain medication for a kidney stone. Her potassium was replaced with 3K riders and a dose of oral potassium. She history of adrenal insufficiency. Pain is adequately controlled. She'll be discharged home with Percocet, Lomotil, Phenergan suppositories. She's instructed to take her potassium as directed. Instructed to followup with her primary care physician early next week for her potassium recheck. Provided strict signs and symptoms for which to return.        Dayton Bailiff, MD 08/04/11 [phone]

## 2011-08-03 NOTE — ED Notes (Signed)
Pt reports yesterday she was dx with colitis-was seen by her PMD yesterday, had blood drawn and states that she received a call today re low K+-3.  Pt reports she has not been able to take her KCL d./t n/v/d.  Pt still reports RLQ pain. Had CT done-denies appendicitis.

## 2011-08-04 MED ORDER — DIPHENOXYLATE-ATROPINE 2.5-0.025 MG PO TABS: 1.0000 | ORAL_TABLET | Freq: Four times a day (QID) | ORAL | Status: DC | PRN

## 2011-08-04 MED ORDER — PROMETHAZINE HCL 25 MG RE SUPP: 25.0000 mg | Freq: Four times a day (QID) | RECTAL | Status: DC | PRN

## 2011-08-04 MED ORDER — OXYCODONE-ACETAMINOPHEN 5-325 MG PO TABS: 1.0000 | ORAL_TABLET | Freq: Four times a day (QID) | ORAL | Status: DC | PRN

## 2011-08-04 NOTE — Discharge Instructions (Signed)

## 2011-08-07 ENCOUNTER — Inpatient Hospital Stay (HOSPITAL_COMMUNITY): Payer: 59

## 2011-08-07 ENCOUNTER — Encounter (HOSPITAL_BASED_OUTPATIENT_CLINIC_OR_DEPARTMENT_OTHER): Payer: Self-pay | Admitting: Emergency Medicine

## 2011-08-07 ENCOUNTER — Inpatient Hospital Stay (HOSPITAL_BASED_OUTPATIENT_CLINIC_OR_DEPARTMENT_OTHER)
Admission: EM | Admit: 2011-08-07 | Discharge: 2011-08-09 | DRG: 392 | Disposition: A | Discharge status: Home / Self Care | Payer: 59 | Source: Ambulatory Visit | Attending: Internal Medicine | Admitting: Internal Medicine

## 2011-08-07 DIAGNOSIS — G8929 Other chronic pain: Secondary | ICD-10-CM | POA: Diagnosis present

## 2011-08-07 DIAGNOSIS — M549 Dorsalgia, unspecified: Secondary | ICD-10-CM | POA: Diagnosis present

## 2011-08-07 DIAGNOSIS — E2749 Other adrenocortical insufficiency: Secondary | ICD-10-CM

## 2011-08-07 DIAGNOSIS — K589 Irritable bowel syndrome without diarrhea: Secondary | ICD-10-CM | POA: Diagnosis present

## 2011-08-07 DIAGNOSIS — R197 Diarrhea, unspecified: Secondary | ICD-10-CM

## 2011-08-07 DIAGNOSIS — F17200 Nicotine dependence, unspecified, uncomplicated: Secondary | ICD-10-CM | POA: Diagnosis present

## 2011-08-07 DIAGNOSIS — R1011 Right upper quadrant pain: Secondary | ICD-10-CM

## 2011-08-07 DIAGNOSIS — IMO0002 Reserved for concepts with insufficient information to code with codable children: Secondary | ICD-10-CM

## 2011-08-07 DIAGNOSIS — Z79899 Other long term (current) drug therapy: Secondary | ICD-10-CM

## 2011-08-07 DIAGNOSIS — F341 Dysthymic disorder: Secondary | ICD-10-CM | POA: Diagnosis present

## 2011-08-07 DIAGNOSIS — R109 Unspecified abdominal pain: Secondary | ICD-10-CM | POA: Diagnosis present

## 2011-08-07 DIAGNOSIS — E876 Hypokalemia: Secondary | ICD-10-CM | POA: Diagnosis present

## 2011-08-07 DIAGNOSIS — K5289 Other specified noninfective gastroenteritis and colitis: Principal | ICD-10-CM | POA: Diagnosis present

## 2011-08-07 DIAGNOSIS — E039 Hypothyroidism, unspecified: Secondary | ICD-10-CM | POA: Diagnosis present

## 2011-08-07 DIAGNOSIS — E2740 Unspecified adrenocortical insufficiency: Secondary | ICD-10-CM | POA: Diagnosis present

## 2011-08-07 LAB — CBC
Hemoglobin: 14.6 g/dL (ref 12.0–15.0)
MCH: 36 pg — ABNORMAL HIGH (ref 26.0–34.0)
MCHC: 34.4 g/dL (ref 30.0–36.0)
MCV: 104.9 fL — ABNORMAL HIGH (ref 78.0–100.0)
Platelets: 265 10*3/uL (ref 150–400)
RBC: 4.05 MIL/uL (ref 3.87–5.11)

## 2011-08-07 LAB — BASIC METABOLIC PANEL
BUN: 13 mg/dL (ref 6–23)
Creatinine, Ser: 0.8 mg/dL (ref 0.50–1.10)
GFR calc non Af Amer: 88 mL/min — ABNORMAL LOW (ref >90)
Glucose, Bld: 79 mg/dL (ref 70–99)
Potassium: 2.7 mEq/L — CL (ref 3.5–5.1)

## 2011-08-07 LAB — DIFFERENTIAL
Eosinophils Absolute: 0.5 10*3/uL (ref 0.0–0.7)
Eosinophils Relative: 4 % (ref 0–5)
Lymphs Abs: 3 10*3/uL (ref 0.7–4.0)
Monocytes Relative: 6 % (ref 3–12)

## 2011-08-07 LAB — URINALYSIS, ROUTINE W REFLEX MICROSCOPIC
Glucose, UA: NEGATIVE mg/dL
Ketones, ur: NEGATIVE mg/dL
pH: 6 (ref 5.0–8.0)

## 2011-08-07 LAB — URINE MICROSCOPIC-ADD ON

## 2011-08-07 MED ORDER — PROMETHAZINE HCL 25 MG/ML IJ SOLN: 25.0000 mg | Freq: Once | INTRAMUSCULAR | Status: DC

## 2011-08-07 MED ORDER — BIOTENE DRY MOUTH MT LIQD
15.0000 mL | Freq: Two times a day (BID) | OROMUCOSAL | Status: DC
  Administered 2011-08-07 – 2011-08-09 (×5): 15 mL via OROMUCOSAL

## 2011-08-07 MED ORDER — SODIUM CHLORIDE 0.9 % IV SOLN: INTRAVENOUS | Status: DC

## 2011-08-07 MED ORDER — PROMETHAZINE HCL 25 MG PO TABS
12.5000 mg | ORAL_TABLET | Freq: Four times a day (QID) | ORAL | Status: DC | PRN
  Administered 2011-08-07 – 2011-08-09 (×6): 12.5 mg via ORAL
  Filled 2011-08-07 (×6): qty 1

## 2011-08-07 MED ORDER — ADULT MULTIVITAMIN W/MINERALS CH
1.0000 | ORAL_TABLET | Freq: Every day | ORAL | Status: DC
  Administered 2011-08-08 – 2011-08-09 (×2): 1 via ORAL
  Filled 2011-08-07 (×2): qty 1

## 2011-08-07 MED ORDER — NORTRIPTYLINE HCL 25 MG PO CAPS
25.0000 mg | ORAL_CAPSULE | Freq: Every day | ORAL | Status: DC
  Administered 2011-08-07 – 2011-08-08 (×2): 25 mg via ORAL
  Filled 2011-08-07 (×3): qty 1

## 2011-08-07 MED ORDER — POTASSIUM CHLORIDE IN NACL 20-0.9 MEQ/L-% IV SOLN
INTRAVENOUS | Status: DC
  Administered 2011-08-07 – 2011-08-09 (×3): via INTRAVENOUS
  Filled 2011-08-07 (×7): qty 1000

## 2011-08-07 MED ORDER — POTASSIUM CHLORIDE CRYS ER 20 MEQ PO TBCR
60.0000 meq | EXTENDED_RELEASE_TABLET | Freq: Four times a day (QID) | ORAL | Status: DC
  Administered 2011-08-07: 60 meq via ORAL
  Filled 2011-08-07: qty 3

## 2011-08-07 MED ORDER — POTASSIUM CHLORIDE 10 MEQ/100ML IV SOLN: 10.0000 meq | Freq: Once | INTRAVENOUS | Status: DC

## 2011-08-07 MED ORDER — POTASSIUM CHLORIDE CRYS ER 20 MEQ PO TBCR
20.0000 meq | EXTENDED_RELEASE_TABLET | Freq: Two times a day (BID) | ORAL | Status: DC
  Administered 2011-08-08 – 2011-08-09 (×3): 20 meq via ORAL
  Filled 2011-08-07 (×4): qty 1

## 2011-08-07 MED ORDER — ACETAMINOPHEN 325 MG PO TABS: 650.0000 mg | ORAL_TABLET | Freq: Four times a day (QID) | ORAL | Status: DC | PRN

## 2011-08-07 MED ORDER — POTASSIUM CHLORIDE 10 MEQ/100ML IV SOLN
10.0000 meq | INTRAVENOUS | Status: AC
  Administered 2011-08-07 (×2): 10 meq via INTRAVENOUS
  Filled 2011-08-07 (×6): qty 100

## 2011-08-07 MED ORDER — CLONAZEPAM 1 MG PO TABS
1.0000 mg | ORAL_TABLET | Freq: Three times a day (TID) | ORAL | Status: DC | PRN
  Administered 2011-08-07 – 2011-08-09 (×4): 1 mg via ORAL
  Filled 2011-08-07 (×5): qty 1

## 2011-08-07 MED ORDER — SODIUM CHLORIDE 0.9 % IV SOLN
1000.0000 mL | Freq: Once | INTRAVENOUS | Status: AC
  Administered 2011-08-07: 1000 mL via INTRAVENOUS

## 2011-08-07 MED ORDER — POTASSIUM CHLORIDE CRYS ER 20 MEQ PO TBCR: 20.0000 meq | EXTENDED_RELEASE_TABLET | Freq: Two times a day (BID) | ORAL | Status: DC

## 2011-08-07 MED ORDER — PROMETHAZINE HCL 25 MG/ML IJ SOLN
25.0000 mg | Freq: Once | INTRAMUSCULAR | Status: AC
  Administered 2011-08-07: 25 mg via INTRAVENOUS
  Filled 2011-08-07: qty 1

## 2011-08-07 MED ORDER — HYDROMORPHONE HCL PF 1 MG/ML IJ SOLN: 1.0000 mg | Freq: Once | INTRAMUSCULAR | Status: DC

## 2011-08-07 MED ORDER — FLORA-Q PO CAPS
1.0000 | ORAL_CAPSULE | Freq: Two times a day (BID) | ORAL | Status: DC
  Administered 2011-08-07 – 2011-08-09 (×4): 1 via ORAL
  Filled 2011-08-07 (×5): qty 1

## 2011-08-07 MED ORDER — SODIUM CHLORIDE 0.9 % IV BOLUS (SEPSIS)
1000.0000 mL | Freq: Once | INTRAVENOUS | Status: AC
  Administered 2011-08-07: 1000 mL via INTRAVENOUS

## 2011-08-07 MED ORDER — PROMETHAZINE HCL 25 MG RE SUPP
25.0000 mg | Freq: Four times a day (QID) | RECTAL | Status: DC | PRN
Start: 2011-08-07 — End: 2011-08-09

## 2011-08-07 MED ORDER — HYDROCODONE-ACETAMINOPHEN 5-325 MG PO TABS
1.0000 | ORAL_TABLET | ORAL | Status: DC | PRN
  Administered 2011-08-08 – 2011-08-09 (×4): 2 via ORAL
  Filled 2011-08-07 (×4): qty 2

## 2011-08-07 MED ORDER — HYDROMORPHONE HCL PF 1 MG/ML IJ SOLN
INTRAMUSCULAR | Status: AC
  Administered 2011-08-07: 1 mg via INTRAVENOUS
  Filled 2011-08-07: qty 1

## 2011-08-07 MED ORDER — HYDROMORPHONE HCL PF 1 MG/ML IJ SOLN
1.0000 mg | Freq: Once | INTRAMUSCULAR | Status: AC
  Administered 2011-08-07: 1 mg via INTRAVENOUS

## 2011-08-07 MED ORDER — MORPHINE SULFATE 2 MG/ML IJ SOLN
1.0000 mg | INTRAMUSCULAR | Status: DC | PRN
  Administered 2011-08-07: 1 mg via INTRAVENOUS
  Administered 2011-08-07 – 2011-08-08 (×4): 2 mg via INTRAVENOUS
  Filled 2011-08-07 (×6): qty 1

## 2011-08-07 MED ORDER — BUDESONIDE 3 MG PO CP24
9.0000 mg | ORAL_CAPSULE | Freq: Every day | ORAL | Status: DC
  Administered 2011-08-08 – 2011-08-09 (×2): 9 mg via ORAL
  Filled 2011-08-07 (×3): qty 3

## 2011-08-07 MED ORDER — HYDROMORPHONE HCL PF 1 MG/ML IJ SOLN
INTRAMUSCULAR | Status: AC
  Filled 2011-08-07: qty 1

## 2011-08-07 MED ORDER — TRAZODONE HCL 50 MG PO TABS
50.0000 mg | ORAL_TABLET | Freq: Every evening | ORAL | Status: DC | PRN
  Administered 2011-08-07 – 2011-08-08 (×2): 50 mg via ORAL
  Filled 2011-08-07 (×2): qty 1

## 2011-08-07 MED ORDER — HYDROMORPHONE HCL PF 1 MG/ML IJ SOLN
1.0000 mg | Freq: Once | INTRAMUSCULAR | Status: AC
  Administered 2011-08-07: 1 mg via INTRAVENOUS
  Filled 2011-08-07: qty 1

## 2011-08-07 MED ORDER — HYDROCORTISONE 20 MG PO TABS
20.0000 mg | ORAL_TABLET | Freq: Two times a day (BID) | ORAL | Status: DC
  Administered 2011-08-07 – 2011-08-08 (×2): 20 mg via ORAL
  Filled 2011-08-07 (×4): qty 1

## 2011-08-07 MED ORDER — CHLORHEXIDINE GLUCONATE 0.12 % MT SOLN
15.0000 mL | Freq: Two times a day (BID) | OROMUCOSAL | Status: DC
  Administered 2011-08-07 – 2011-08-09 (×3): 15 mL via OROMUCOSAL
  Filled 2011-08-07 (×7): qty 15

## 2011-08-07 MED ORDER — POTASSIUM CHLORIDE 10 MEQ/100ML IV SOLN
10.0000 meq | INTRAVENOUS | Status: AC
  Administered 2011-08-07 (×2): 10 meq via INTRAVENOUS
  Filled 2011-08-07 (×2): qty 100

## 2011-08-07 MED ORDER — ACETAMINOPHEN 650 MG RE SUPP: 650.0000 mg | Freq: Four times a day (QID) | RECTAL | Status: DC | PRN

## 2011-08-07 MED ORDER — LEVOTHYROXINE SODIUM 50 MCG PO TABS
50.0000 ug | ORAL_TABLET | Freq: Every day | ORAL | Status: DC
  Administered 2011-08-07 – 2011-08-09 (×3): 50 ug via ORAL
  Filled 2011-08-07 (×4): qty 1

## 2011-08-07 MED ORDER — DIPHENOXYLATE-ATROPINE 2.5-0.025 MG PO TABS: 1.0000 | ORAL_TABLET | Freq: Four times a day (QID) | ORAL | Status: DC | PRN

## 2011-08-07 NOTE — ED Notes (Signed)
Called [phone] to call report.  Nurse Burna Mortimer is unavailable, name and number given.  Awaiting return call.

## 2011-08-07 NOTE — Progress Notes (Signed)
Pt placed on contact precautions per IP protocol for r/o C.diff. Pt education provided and discussed. Julien Nordmann [facility]

## 2011-08-07 NOTE — ED Provider Notes (Signed)
History     CSN: [id number]  Arrival date & time 08/07/11  1001   First MD Initiated Contact with Patient 08/07/11 1043      Chief Complaint  Patient presents with  . Diarrhea  . Abdominal Cramping  . Leg Pain  . Palpitations    (Consider location/radiation/quality/duration/timing/severity/associated sxs/prior treatment) Patient is a 46 y.o. female presenting with diarrhea, cramps, leg pain, and palpitations. The history is provided by the patient.  Diarrhea The primary symptoms include abdominal pain, nausea, vomiting and diarrhea. Primary symptoms do not include fever or dysuria. Primary symptoms comment: hematuria and leg cramps The illness began 3 to 5 days ago. The onset was gradual. The problem has been gradually worsening.  The abdominal pain is generalized. The abdominal pain does not radiate. The severity of the abdominal pain is 6/10. The abdominal pain is relieved by nothing.  The vomiting began 2 days ago. Vomiting occurs 2 to 5 times per day. The emesis contains stomach contents.  The diarrhea began 3 to 5 days ago. The diarrhea is watery. The diarrhea occurs more than 10 times per day (About 30 times in a 24-hour period).  The illness is also significant for anorexia, bloating and back pain. Associated symptoms comments: Right flank pain that's been intermittent. Associated medical issues comments: Prior history of colitis.  Abdominal Cramping The primary symptoms of the illness include abdominal pain, nausea, vomiting and diarrhea. The primary symptoms of the illness do not include fever or dysuria. Primary symptoms comment: hematuria and leg cramps  Additional symptoms associated with the illness include anorexia and back pain. Associated medical issues comments: Prior history of colitis.  Leg Pain   Palpitations  Associated symptoms include abdominal pain, nausea, vomiting, back pain and leg pain. Pertinent negatives include no fever.    Past Medical History    Diagnosis Date  . Colitis   . WPW (Wolff-Parkinson-White syndrome)   . Kidney calculi   . Kidney stone   . Addison disease   . Thyroid disease   . Clostridium difficile diarrhea   . Anxiety and depression   . Chronic diarrhea   . Chronic back pain   . Drug-seeking behavior   . Irritable bowel syndrome (IBS)   . Anemia   . Colitis   . Addison's disease     Past Surgical History  Procedure Date  . Cholecystectomy   . Tubal ligation   . Lithotripsy   . Sphincterotomy   . Cardiac electrophysiology mapping and ablation     for WPW    Family History  Problem Relation Age of Onset  . Colon cancer Neg Hx     History  Substance Use Topics  . Smoking status: Current Everyday Smoker -- 1.0 packs/day for 30 years    Types: Cigarettes  . Smokeless tobacco: Never Used  . Alcohol Use: No    OB History    Grav Para Term Preterm Abortions TAB SAB Ect Mult Living   3 3              Review of Systems  Constitutional: Negative for fever.  Cardiovascular: Positive for palpitations.  Gastrointestinal: Positive for nausea, vomiting, abdominal pain, diarrhea, bloating and anorexia.  Genitourinary: Negative for dysuria.  Musculoskeletal: Positive for back pain.  All other systems reviewed and are negative.    Allergies  Ambien; Nsaids; and Zofran  Home Medications   Current Outpatient Rx  Name Route Sig Dispense Refill  . BUDESONIDE ER 3 MG PO CP24  Oral Take 9 mg by mouth every morning.     Marland Kitchen CLONAZEPAM 1 MG PO TABS Oral Take 1 mg by mouth 4 (four) times daily as needed. anxiety    . DIPHENOXYLATE-ATROPINE 2.5-0.025 MG PO TABS Oral Take 1 tablet by mouth 4 (four) times daily as needed for diarrhea or loose stools. 10 tablet 0  . HYDROCORTISONE 20 MG PO TABS Oral Take 20 mg by mouth 2 (two) times daily.      Marland Kitchen LEVOTHYROXINE SODIUM 50 MCG PO TABS Oral Take 50 mcg by mouth daily.      . ADULT MULTIVITAMIN W/MINERALS CH Oral Take 1 tablet by mouth daily.      Marland Kitchen  NORTRIPTYLINE HCL 25 MG PO CAPS Oral Take 25 mg by mouth at bedtime.    . OXYCODONE-ACETAMINOPHEN 5-325 MG PO TABS Oral Take 1-2 tablets by mouth every 6 (six) hours as needed for pain. 20 tablet 0  . POTASSIUM CHLORIDE CRYS ER 20 MEQ PO TBCR Oral Take 1 tablet (20 mEq total) by mouth 2 (two) times daily. 30 tablet 0  . PROMETHAZINE HCL 25 MG RE SUPP Rectal Place 1 suppository (25 mg total) rectally every 6 (six) hours as needed for nausea. 12 each 0  . RANITIDINE HCL 150 MG PO CAPS Oral Take 1 capsule (150 mg total) by mouth daily. 30 capsule 0    BP 145/94  Pulse 84  Temp(Src) 98.3 F (36.8 C) (Oral)  Resp 20  Ht 5\' 4"  (1.626 m)  Wt 135 lb (61.236 kg)  BMI 23.17 kg/m2  SpO2 100%  LMP 07/13/2011  Physical Exam  Nursing note and vitals reviewed. Constitutional: She is oriented to person, place, and time. She appears well-developed and well-nourished. No distress.  HENT:  Head: Normocephalic and atraumatic.  Mouth/Throat: Oropharynx is clear and moist. Mucous membranes are dry.  Eyes: Conjunctivae and EOM are normal. Pupils are equal, round, and reactive to light.  Neck: Normal range of motion. Neck supple.  Cardiovascular: Normal rate, regular rhythm and intact distal pulses.   No murmur heard. Pulmonary/Chest: Effort normal and breath sounds normal. No respiratory distress. She has no wheezes. She has no rales.  Abdominal: Soft. Normal appearance. She exhibits no distension. There is no tenderness. There is CVA tenderness. There is no rebound and no guarding.  Musculoskeletal: Normal range of motion. She exhibits no edema and no tenderness.  Neurological: She is alert and oriented to person, place, and time.  Skin: Skin is warm and dry. No rash noted. No erythema.  Psychiatric: She has a normal mood and affect. Her behavior is normal.    ED Course  Procedures (including critical care time)  Labs Reviewed  BASIC METABOLIC PANEL - Abnormal; Notable for the following:     Potassium 2.7 (*)    GFR calc non Af Amer 88 (*)    All other components within normal limits  CBC - Abnormal; Notable for the following:    WBC 13.5 (*)    MCV 104.9 (*)    MCH 36.0 (*)    All other components within normal limits  DIFFERENTIAL - Abnormal; Notable for the following:    Neutro Abs 9.1 (*)    All other components within normal limits  URINALYSIS, ROUTINE W REFLEX MICROSCOPIC - Abnormal; Notable for the following:    Hgb urine dipstick MODERATE (*)    Bilirubin Urine SMALL (*)    All other components within normal limits  URINE MICROSCOPIC-ADD ON - Abnormal; Notable for  the following:    Squamous Epithelial / LPF MANY (*)    Bacteria, UA MANY (*)    All other components within normal limits  LIPASE, BLOOD  PREGNANCY, URINE   No results found.   Date: 08/07/2011  Rate: 90  Rhythm: normal sinus rhythm  QRS Axis: normal  Intervals: normal  ST/T Wave abnormalities: normal  Conduction Disutrbances:none  Narrative Interpretation:   Old EKG Reviewed: unchanged    1. Hypokalemia   2. Diarrhea       MDM   Patient with a history of colitis and multiple ER visits. She was seen by her doctor in the ER 4 days ago with a potassium of 2.9 which was thought to be due to the persistent diarrhea she's been having. She was replaced with IV and oral potassium. Patient's taking 40 mEq of potassium at home but states the diarrhea and vomiting has only gotten worse. Now she is having up to 40 episodes of diarrhea a day and vomiting 3-4 times a day. She is still taking her 40 mEq of potassium but states that she feels like it goes straight through her cushy single pill fragments in her stool. Patient has mild CVA tenderness on the right on exam however she has a history of kidney stones. Pain is not radiating down to the groin suggestive of an obstructive stone in her UA had some blood in it but no sign of infection. She's never required retrieval of the stone and did not feel she  needs another CAT scan today.  CBC with a leukocytosis of 13,000 which is new and a potassium of 2.7. This most likely is due to all the diarrhea the patient's been having. She's been trying Lomotil for her diarrhea but states it is not helping. Considering this is her second visit in 4 days and now has a potassium that he can lower despite oral therapy will replace her with IV potassium unable to tolerate by mouth straight now. Will admit for further evaluation        Gwyneth Sprout, MD 08/07/11 1236

## 2011-08-07 NOTE — Plan of Care (Signed)
Problem: Phase I Progression Outcomes Goal: Initial discharge plan identified Outcome: Completed/Met Date Met:  08/07/11 Return home with spouse when medically cleared

## 2011-08-07 NOTE — Progress Notes (Signed)
Contact discontinued due to stool sample C. Diff negative. Dawn Frost

## 2011-08-07 NOTE — Progress Notes (Signed)
Dawn Frost [id number] Admission Data: 08/07/2011 1450 Attending Provider: Clydia Llano, MD  WUJ:[email or web address],GNFAOZ, MD, MD Consults/ Treatment Team:    Dawn Frost is a 46 y.o. female patient admitted from ED awake, alert  & orientated  X 3,  Full Code, VSS - Blood pressure 156/90, pulse 73, temperature 98 F (36.7 C), temperature source Oral, resp. rate 19, height 5\' 4"  (1.626 m), weight 60.691 kg (133 lb 12.8 oz), last menstrual period 07/13/2011, SpO2 98.00%., no c/o shortness of breath, no c/o chest pain, no distress noted., nontele   IV site WDL:  antecubital right, condition patent and no redness with a transparent dsg that's clean dry and intact.  Allergies:   Allergies  Allergen Reactions  . Ambien (Zolpidem Tartrate) Other (See Comments)    headache  . Nsaids Other (See Comments)    Bad for colitis  . Zofran Other (See Comments)    Headache      Past Medical History  Diagnosis Date  . Colitis   . WPW (Wolff-Parkinson-White syndrome)   . Kidney calculi   . Kidney stone   . Addison disease   . Thyroid disease   . Clostridium difficile diarrhea   . Anxiety and depression   . Chronic diarrhea   . Chronic back pain   . Drug-seeking behavior   . Irritable bowel syndrome (IBS)   . Anemia   . Colitis   . Addison's disease     History:  obtained from the patient. Tobacco: smokes 1 pack of cigarrettes per day  Alcohol: denies  Pt orientation to unit, room and routine. Information packet given to patient/family and safety video watched.  Admission INP armband ID verified with patient/family, and in place. SR up x 2, fall risk assessment complete with Patient and family verbalizing understanding of risks associated with falls. Pt verbalizes an understanding of how to use the call bell and to call for help before getting out of bed.  Skin, clean-dry- intact without evidence of bruising, or skin tears.   No evidence of skin break down noted on exam.    Will cont to  monitor and assist as needed.  Julien Nordmann [facility], RN 08/07/2011 4:57 PM

## 2011-08-07 NOTE — ED Notes (Addendum)
Pt sent to ED by PCP for IV potassium on 08-03-11.  Pt states she continues to have diarrhea, right flank/abdominal cramping.  She states she noticed some palpitations and leg cramping and believes her K+ is low again.  Pt states she has seen her pills come out of her whole.

## 2011-08-07 NOTE — H&P (Signed)
Hospital Admission Note Date: 08/07/2011  Patient name: Dawn Frost           Medical record number: [id number] Date of birth: 20-Jul-1965           Age: 46 y.o.   Gender: female    PCP:   Juan Quam, MD, MD   Chief Complaint:  Abdominal pain and diarrhea  HPI: Dawn Frost is a 46 y.o. 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 Patient also does have Addison's disease and she is on chronic steroids. History of C. difficile colitis. Patient given to the hospital because of abdominal pain. She was in her usual state of health till about 6 days ago when she started to have some leg cramps, patient was seen by primary care physician and she was found to have low potassium. Patient instructed to increase her potassium intake. The same time patient says she started to have a lot of abdominal pain, the pain is 6/10, right upper quadrant, no radiation, no decreasing or increasing factors. This pain is associated with diarrhea. She said she has about 18-20 episodes of diarrhea a day. Patient had these symptoms comes and goes and she seemed like she had flare up to her colitis.  Past Medical History: Past Medical History  Diagnosis Date  . Colitis   . WPW (Wolff-Parkinson-White syndrome)   . Kidney calculi   . Kidney stone   . Addison disease   . Thyroid disease   . Clostridium difficile diarrhea   . Anxiety and depression   . Chronic diarrhea   . Chronic back pain   . Drug-seeking behavior   . Irritable bowel syndrome (IBS)   . Anemia   . Colitis   . Addison's disease    Past Surgical History  Procedure Date  . Cholecystectomy   . Tubal ligation   . Lithotripsy   . Sphincterotomy   . Cardiac electrophysiology mapping and ablation     for WPW    Medications: Prior to Admission medications   Medication Sig Start Date End Date Taking? Authorizing Provider  budesonide (ENTOCORT EC) 3 MG 24 hr capsule Take 9 mg by mouth every morning.     Yes Historical Provider, MD  clonazePAM (KLONOPIN) 1 MG tablet Take 1 mg by mouth 4 (four) times daily as needed. anxiety   Yes Historical Provider, MD  diphenoxylate-atropine (LOMOTIL) 2.5-0.025 MG per tablet Take 1 tablet by mouth 4 (four) times daily as needed. For diarrhea or loose stools. 08/04/11 08/14/11 Yes Dayton Bailiff, MD  hydrocortisone (CORTEF) 20 MG tablet Take 20 mg by mouth 2 (two) times daily.     Yes Historical Provider, MD  levothyroxine (SYNTHROID, LEVOTHROID) 50 MCG tablet Take 50 mcg by mouth daily.     Yes Historical Provider, MD  Multiple Vitamin (MULITIVITAMIN WITH MINERALS) TABS Take 1 tablet by mouth daily.     Yes Historical Provider, MD  nortriptyline (PAMELOR) 25 MG capsule Take 25 mg by mouth at bedtime.   Yes Historical Provider, MD  potassium chloride SA (K-DUR,KLOR-CON) 20 MEQ tablet Take 1 tablet (20 mEq total) by mouth 2 (two) times daily. 01/23/11 01/23/12 Yes Carleene Cooper III, MD  promethazine (PHENERGAN) 25 MG suppository Place 25 mg rectally every 6 (six) hours as needed. For constipation. 08/04/11 08/11/11 Yes Dayton Bailiff, MD  ranitidine (ZANTAC) 150 MG capsule Take 1 capsule (150 mg total) by mouth daily. 07/24/11 07/23/12 Yes Ethelda Chick, MD    Allergies:   Allergies  Allergen Reactions  . Ambien (Zolpidem Tartrate) Other (See Comments)    headache  . Nsaids Other (See Comments)    Bad for colitis  . Zofran Other (See Comments)    Headache     Social History:  reports that she has been smoking Cigarettes.  She has a 30 pack-year smoking history. She has never used smokeless tobacco. She reports that she does not drink alcohol or use illicit drugs.  Family History: Family History  Problem Relation Age of Onset  . Colon cancer Neg Hx     Review of Systems:  Constitutional: negative for anorexia, fevers and sweats Eyes: negative for irritation, redness and visual disturbance Ears, nose, mouth, throat, and face: negative for earaches, epistaxis,  nasal congestion and sore throat Respiratory: negative for cough, dyspnea on exertion, sputum and wheezing Cardiovascular: negative for chest pain, dyspnea, lower extremity edema, orthopnea, palpitations and syncope Gastrointestinal: negative for abdominal pain, constipation, diarrhea, melena, nausea and vomiting Genitourinary:negative for dysuria, frequency and hematuria Hematologic/lymphatic: negative for bleeding, easy bruising and lymphadenopathy Musculoskeletal:negative for arthralgias, muscle weakness and stiff joints Neurological: negative for coordination problems, gait problems, headaches and weakness Endocrine: negative for diabetic symptoms including polydipsia, polyuria and weight loss Allergic/Immunologic: negative for anaphylaxis, hay fever and urticaria  Physical Exam: BP 156/90  Pulse 73  Temp(Src) 98 F (36.7 C) (Oral)  Resp 19  Ht 5\' 4"  (1.626 m)  Wt 60.691 kg (133 lb 12.8 oz)  BMI 22.97 kg/m2  SpO2 98%  LMP 07/13/2011 General appearance: alert, cooperative and no distress  Head: Normocephalic, without obvious abnormality, atraumatic  Eyes: conjunctivae/corneas clear. PERRL, EOM's intact. Fundi benign.  Nose: Nares normal. Septum midline. Mucosa normal. No drainage or sinus tenderness.  Throat: lips, mucosa, and tongue normal; teeth and gums normal  Neck: Supple, no masses, no cervical lymphadenopathy, no JVD appreciated, no meningeal signs Resp: clear to auscultation bilaterally  Chest wall: no tenderness  Cardio: regular rate and rhythm, S1, S2 normal, no murmur, click, rub or gallop  GI: soft, non-tender; bowel sounds normal; no masses, no organomegaly  Extremities: extremities normal, atraumatic, no cyanosis or edema  Skin: Skin color, texture, turgor normal. No rashes or lesions  Neurologic: Alert and oriented X 3, normal strength and tone. Normal symmetric reflexes. Normal coordination and gait  Labs on Admission:   Basename 08/07/11 1020  NA 142  K  2.7*  CL 100  CO2 29  GLUCOSE 79  BUN 13  CREATININE 0.80  CALCIUM 9.8  MG --  PHOS --   No results found for this basename: AST:2,ALT:2,ALKPHOS:2,BILITOT:2,PROT:2,ALBUMIN:2 in the last 72 hours  Basename 08/07/11 1020  LIPASE 12  AMYLASE --    Basename 08/07/11 1020  WBC 13.5*  NEUTROABS 9.1*  HGB 14.6  HCT 42.5  MCV 104.9*  PLT 265   No results found for this basename: CKTOTAL:3,CKMB:3,CKMBINDEX:3,TROPONINI:3 in the last 72 hours No results found for this basename: TSH,T4TOTAL,FREET3,T3FREE,THYROIDAB in the last 72 hours No results found for this basename: VITAMINB12:2,FOLATE:2,FERRITIN:2,TIBC:2,IRON:2,RETICCTPCT:2 in the last 72 hours  Radiological Exams on Admission: Dg Chest 2 View  07/24/2011  *RADIOLOGY REPORT*  Clinical Data: Chest pain and shortness of breath  CHEST - 2 VIEW  Comparison: 05/01/2011  Findings: Cardiomediastinal silhouette is within normal limits. The lungs are clear. No pleural effusion.  No pneumothorax.  No acute osseous abnormality. Cholecystectomy clips noted.  IMPRESSION: Normal chest.  Original Report Authenticated By: Harrel Lemon, M.D.     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 I will obtain stool studies, patient already on Lomotil, I will start her on the Flora-Q.  Hypokalemia Severe hypokalemia with potassium of 2.7, this is likely secondary to her severe diarrhea. Patient had 20 mEq of IV potassium in [facility], patient started already on potassium-containing IV fluids. I will replace her potassium orally with worth of 120 mEq of oral potassium.  Acute abdominal pain This is likely secondary to her colitis, this is not her primary concern right now, she is concerned about the  diarrhea and about the hypokalemia. This is a will be controlled by IV narcotics. I will obtain a KUB. Patient had multiple workup from before and she follows with gastroenterologist at [facility].  Addison disease Patient is on Cortef 20 mg twice a day, excess of glucocorticoid can cause hypokalemia, and hypernatremia. We will try to taper down the dose if she does not have active Addison disease symptoms.  Kayelee Herbig A 08/07/2011, 4:53 PM

## 2011-08-08 DIAGNOSIS — R1011 Right upper quadrant pain: Secondary | ICD-10-CM

## 2011-08-08 DIAGNOSIS — R197 Diarrhea, unspecified: Secondary | ICD-10-CM

## 2011-08-08 DIAGNOSIS — E876 Hypokalemia: Secondary | ICD-10-CM

## 2011-08-08 DIAGNOSIS — E2749 Other adrenocortical insufficiency: Secondary | ICD-10-CM

## 2011-08-08 LAB — BASIC METABOLIC PANEL
BUN: 7 mg/dL (ref 6–23)
CO2: 22 mEq/L (ref 19–32)
GFR calc non Af Amer: >90 mL/min (ref >90)
Glucose, Bld: 77 mg/dL (ref 70–99)
Potassium: 3.5 mEq/L (ref 3.5–5.1)

## 2011-08-08 LAB — CORTISOL: Cortisol, Plasma: 1.7 ug/dL

## 2011-08-08 MED ORDER — PANTOPRAZOLE SODIUM 40 MG PO TBEC
40.0000 mg | DELAYED_RELEASE_TABLET | Freq: Two times a day (BID) | ORAL | Status: DC
  Administered 2011-08-08 – 2011-08-09 (×3): 40 mg via ORAL
  Filled 2011-08-08 (×3): qty 1

## 2011-08-08 MED ORDER — MORPHINE SULFATE 2 MG/ML IJ SOLN
1.0000 mg | INTRAMUSCULAR | Status: DC | PRN
  Administered 2011-08-08 – 2011-08-09 (×9): 2 mg via INTRAVENOUS
  Filled 2011-08-08 (×9): qty 1

## 2011-08-08 MED ORDER — SUCRALFATE 1 GM/10ML PO SUSP
1.0000 g | Freq: Three times a day (TID) | ORAL | Status: DC
  Administered 2011-08-08 – 2011-08-09 (×5): 1 g via ORAL
  Filled 2011-08-08 (×9): qty 10

## 2011-08-08 MED ORDER — POTASSIUM CHLORIDE 10 MEQ/100ML IV SOLN
10.0000 meq | INTRAVENOUS | Status: AC
  Administered 2011-08-08 (×3): 10 meq via INTRAVENOUS

## 2011-08-08 MED ORDER — HYDROCORTISONE 10 MG PO TABS
10.0000 mg | ORAL_TABLET | Freq: Two times a day (BID) | ORAL | Status: DC
  Administered 2011-08-08 – 2011-08-09 (×3): 10 mg via ORAL
  Filled 2011-08-08 (×4): qty 1

## 2011-08-08 MED ORDER — PSYLLIUM 95 % PO PACK
1.0000 | PACK | Freq: Three times a day (TID) | ORAL | Status: DC
  Administered 2011-08-08 – 2011-08-09 (×4): 1 via ORAL
  Filled 2011-08-08 (×6): qty 1

## 2011-08-08 NOTE — Progress Notes (Signed)
08/08/2011 Dawn Frost SPARKS Case Management Note [phone]  Utilization review completed.  

## 2011-08-08 NOTE — Progress Notes (Signed)
DAILY PROGRESS NOTE                              [facility]  SUBJECTIVE: Reported 9-10 bowel movements a day. Improving still has abdominal pain.  OBJECTIVE: BP 163/90  Pulse 60  Temp(Src) 98.2 F (36.8 C) (Oral)  Resp 18  Ht 5\' 4"  (1.626 m)  Wt 60.691 kg (133 lb 12.8 oz)  BMI 22.97 kg/m2  SpO2 96%  LMP 07/13/2011  Intake/Output Summary (Last 24 hours) at 08/08/11 1118 Last data filed at 08/08/11 0634  Gross per 24 hour  Intake 603.75 ml  Output      4 ml  Net 599.75 ml                      Weight change:  Physical Exam: General: Alert and awake oriented x3 not in any acute distress. HEENT: anicteric sclera, pupils equal reactive to light and accommodation CVS: S1-S2 heard, no murmur rubs or gallops Chest: clear to auscultation bilaterally, no wheezing rales or rhonchi Abdomen:  normal bowel sounds, soft, nontender, nondistended, no organomegaly Neuro: Cranial nerves II-XII intact, no focal neurological deficits Extremities: no cyanosis, no clubbing or edema noted bilaterally   Lab Results:  Basename 08/08/11 0552 08/07/11 1020  NA 138 142  K 3.5 2.7*  CL 107 100  CO2 22 29  GLUCOSE 77 79  BUN 7 13  CREATININE 0.60 0.80  CALCIUM 8.2* 9.8  MG -- --  PHOS -- --   No results found for this basename: AST:2,ALT:2,ALKPHOS:2,BILITOT:2,PROT:2,ALBUMIN:2 in the last 72 hours  Basename 08/07/11 1020  LIPASE 12  AMYLASE --    Basename 08/07/11 1020  WBC 13.5*  NEUTROABS 9.1*  HGB 14.6  HCT 42.5  MCV 104.9*  PLT 265   Basename 08/07/11 1713  TSH 0.257*  T4TOTAL --  T3FREE --  THYROIDAB --   No results found for this basename: VITAMINB12:2,FOLATE:2,FERRITIN:2,TIBC:2,IRON:2,RETICCTPCT:2 in the last 72 hours  Micro Results: Recent Results (from the past 240 hour(s))  CLOSTRIDIUM DIFFICILE BY PCR     Status: Normal   Collection Time   08/07/11  5:22 PM      Component Value Range Status Comment   C difficile by pcr NEGATIVE   NEGATIVE  Final     Studies/Results: Dg Chest 2 View  07/24/2011  *RADIOLOGY REPORT*  Clinical Data: Chest pain and shortness of breath  CHEST - 2 VIEW  Comparison: 05/01/2011  Findings: Cardiomediastinal silhouette is within normal limits. The lungs are clear. No pleural effusion.  No pneumothorax.  No acute osseous abnormality. Cholecystectomy clips noted.  IMPRESSION: Normal chest.  Original Report Authenticated By: Harrel Lemon, M.D.   Dg Abd 2 Views  08/07/2011  *RADIOLOGY REPORT*  Clinical Data: Right lower quadrant abdominal pain.  ABDOMEN - 2 VIEW  Comparison: 06/09/2011  Findings: Upright film shows no evidence for intraperitoneal free air. There is no evidence for gaseous bowel dilation to suggest obstruction.  Gas is seen in the relatively featureless left colon. Surgical clips in the right upper quadrant suggest prior cholecystectomy.  Surgical ligation clips are seen in the anatomic pelvis.  No unexpected abdominopelvic calcification.  IMPRESSION:   No intraperitoneal free air.  Gas is seen within a relatively featureless descending colon.  This can be seen in cases of colonic wall edema/inflammation.  The CT imaging may prove helpful to further evaluate as clinically warranted.  Original Report Authenticated By: ERIC A. MANSELL, M.D.   Medications: Scheduled Meds:   . sodium chloride  1,000 mL Intravenous Once  . antiseptic oral rinse  15 mL Mouth Rinse q12n4p  . budesonide  9 mg Oral Q breakfast  . chlorhexidine  15 mL Mouth Rinse BID  . Flora-Q  1 capsule Oral BID  . hydrocortisone  20 mg Oral BID WC  . HYDROmorphone  1 mg Intravenous Once  . HYDROmorphone  1 mg Intravenous Once  . HYDROmorphone  1 mg Intravenous Once  . levothyroxine  50 mcg Oral Q breakfast  . mulitivitamin with minerals  1 tablet Oral Daily  . nortriptyline  25 mg Oral QHS  . potassium chloride  10 mEq Intravenous Q1 Hr x 4  . potassium chloride  10 mEq Intravenous Q1 Hr x 6  . potassium chloride  10  mEq Intravenous Q1 Hr x 3  . potassium chloride SA  20 mEq Oral BID  . promethazine  25 mg Intravenous Once  . sodium chloride  1,000 mL Intravenous Once  . sodium chloride  1,000 mL Intravenous Once  . DISCONTD: sodium chloride   Intravenous STAT  . DISCONTD:  HYDROmorphone (DILAUDID) injection  1 mg Intravenous Once  . DISCONTD: potassium chloride  10 mEq Intravenous Once  . DISCONTD: potassium chloride SA  20 mEq Oral BID  . DISCONTD: potassium chloride  60 mEq Oral Q6H  . DISCONTD: promethazine  25 mg Intravenous Once   Continuous Infusions:   . 0.9 % NaCl with KCl 20 mEq / L 75 mL/hr at 08/07/11 1737  . 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 -Stool studies showed no evidence of infection was negative C. difficile PCR in wbc's. -Patient started on Flora-Q, called her on high fiber diet too.  Hypokalemia -Severe hypokalemia with potassium of 2.7, this is corrected overnight with  IV potassium supplementation. -This is likely secondary to severe diarrhea, diarrhea slowing down now. -I cannot rule out this is also secondary to excess of mineralocorticoids.  -Patient is on 20 mg of hydrocortisone twice a day  Addison disease -Patient is on Cortef 20 mg twice a day, her cortisol is suppressed at 1.7. -Patient did not mention any symptoms or complaints consistent with Addison disease even before she was started on steroids. -I will start to wean her off the steroids over long period of time.  Acute on chronic abdominal pain -Patient reported this is secondary to her microscopic  colitis. -She follows with gastroenterologist in [facility]. -This is being worked up before multiple times, no further workup this time.   LOS: 1 day   Trixy Loyola A 08/08/2011, 11:18 AM

## 2011-08-08 NOTE — Progress Notes (Signed)
Pt complaining of scant amount of blood in her urine.  Dawn Frost

## 2011-08-08 NOTE — Progress Notes (Signed)
Pt said she could not tolerate K+ PO.  Contacted [facility], NP order given for 6 runs of K+ IV.  Patient was able to tolerate 4.25 bags of K+ IV and then her arm started to burn.  Stopped IV fluids.  Will contact MD on call for future orders.  Dahlia Byes Boschen

## 2011-08-08 NOTE — Progress Notes (Signed)
Contacted [facility] NP regarding K+ IV runs not completed.  Advised to wait for am BMET to check K+ level.  Will continue to monitor. Dahlia Byes Boschen

## 2011-08-08 NOTE — Care Management Note (Signed)
    Page 1 of 1   08/10/2011     10:44:50 AM   CARE MANAGEMENT NOTE 08/10/2011  Patient:  Dawn Frost, Dawn Frost   Account Number:  [id number]  Date Initiated:  08/08/2011  Documentation initiated by:  Letha Cape  Subjective/Objective Assessment:   dx hypo,kalemia, hypernatremia, n/d/abd pain, adrenal insuff. pta independent    admit- lives with spouse.     Action/Plan:   Anticipated DC Date:  08/09/2011   Anticipated DC Plan:  HOME/SELF CARE      DC Planning Services  CM consult      Choice offered to / List presented to:             Status of service:  Completed, signed off Medicare Important Message given?   (If response is "NO", the following Medicare IM given date fields will be blank) Date Medicare IM given:   Date Additional Medicare IM given:    Discharge Disposition:  HOME/SELF CARE  Per UR Regulation:    If discussed at Long Length of Stay Meetings, dates discussed:    Comments:  08/10/11 10:43 Letha Cape RN, BSN [phone] patient dc to home.  08/08/11 16:31 Letha Cape RN, BSN [phone] patient  lives with spouse, pta independent, patient has medication coverage and transportation. No needs identified at this time.  NCM will continue to follow for dc needs.

## 2011-08-09 DIAGNOSIS — R197 Diarrhea, unspecified: Secondary | ICD-10-CM

## 2011-08-09 DIAGNOSIS — E2749 Other adrenocortical insufficiency: Secondary | ICD-10-CM

## 2011-08-09 DIAGNOSIS — R1011 Right upper quadrant pain: Secondary | ICD-10-CM

## 2011-08-09 DIAGNOSIS — E876 Hypokalemia: Secondary | ICD-10-CM

## 2011-08-09 LAB — BASIC METABOLIC PANEL
BUN: 6 mg/dL (ref 6–23)
Chloride: 107 mEq/L (ref 96–112)
Creatinine, Ser: 0.57 mg/dL (ref 0.50–1.10)
GFR calc Af Amer: >90 mL/min (ref >90)
Glucose, Bld: 98 mg/dL (ref 70–99)
Potassium: 3.4 mEq/L — ABNORMAL LOW (ref 3.5–5.1)

## 2011-08-09 LAB — FECAL LACTOFERRIN, QUANT: Fecal Lactoferrin: NEGATIVE

## 2011-08-09 MED ORDER — SUCRALFATE 1 GM/10ML PO SUSP: 1.0000 g | Freq: Four times a day (QID) | ORAL | Status: DC

## 2011-08-09 MED ORDER — POTASSIUM CHLORIDE CRYS ER 20 MEQ PO TBCR: 40.0000 meq | EXTENDED_RELEASE_TABLET | Freq: Two times a day (BID) | ORAL | Status: DC

## 2011-08-09 MED ORDER — POTASSIUM CHLORIDE CRYS ER 20 MEQ PO TBCR
60.0000 meq | EXTENDED_RELEASE_TABLET | Freq: Once | ORAL | Status: AC
  Administered 2011-08-09: 60 meq via ORAL
  Filled 2011-08-09: qty 3

## 2011-08-09 MED ORDER — POTASSIUM CHLORIDE 10 MEQ/100ML IV SOLN
10.0000 meq | INTRAVENOUS | Status: AC
  Administered 2011-08-09 (×2): 10 meq via INTRAVENOUS
  Filled 2011-08-09 (×2): qty 100

## 2011-08-09 MED ORDER — HYDROCORTISONE 10 MG PO TABS: 10.0000 mg | ORAL_TABLET | Freq: Two times a day (BID) | ORAL | Status: DC

## 2011-08-09 NOTE — Discharge Summary (Addendum)
HOSPITAL DISCHARGE SUMMARY  Dawn Frost  MRN: [id number]  DOB:[birth year]  Date of Admission: 08/07/2011 Date of Discharge: 08/09/2011         LOS: 2 days   Attending Physician:  Clydia Llano A  Patient's PCP:  Juan Quam, MD, MD  Consults: None  Discharge Diagnoses: Present on Admission:  .Abdominal pain, acute .Adrenal insufficiency .Diarrhea .Hypothyroid .Hypokalemia   Medication List  As of 08/09/2011  4:10 PM   TAKE these medications         budesonide 3 MG 24 hr capsule   Commonly known as: ENTOCORT EC   Take 9 mg by mouth every morning.      clonazePAM 1 MG tablet   Commonly known as: KLONOPIN   Take 1 mg by mouth 4 (four) times daily as needed. anxiety      diphenoxylate-atropine 2.5-0.025 MG per tablet   Commonly known as: LOMOTIL   Take 1 tablet by mouth 4 (four) times daily as needed. For diarrhea or loose stools.      hydrocortisone 10 MG tablet   Commonly known as: CORTEF   Take 1 tablet (10 mg total) by mouth 2 (two) times daily.      levothyroxine 50 MCG tablet   Commonly known as: SYNTHROID, LEVOTHROID   Take 50 mcg by mouth daily.      mulitivitamin with minerals Tabs   Take 1 tablet by mouth daily.      nortriptyline 25 MG capsule   Commonly known as: PAMELOR   Take 25 mg by mouth at bedtime.      potassium chloride SA 20 MEQ tablet   Commonly known as: K-DUR,KLOR-CON   Take 2 tablets (40 mEq total) by mouth 2 (two) times daily.      promethazine 25 MG suppository   Commonly known as: PHENERGAN   Place 25 mg rectally every 6 (six) hours as needed. For constipation.      ranitidine 150 MG capsule   Commonly known as: ZANTAC   Take 1 capsule (150 mg total) by mouth daily.      sucralfate 1 GM/10ML suspension   Commonly known as: CARAFATE   Take 10 mLs (1 g total) by mouth 4 (four) times daily.             Brief Admission History: Dawn Frost is a 46 y.o. female with medical history of chronic diarrhea secondary to  microscopic colitis. Patient also does have Addison's disease and she is on chronic steroids. History of C. difficile colitis. Patient given to the hospital because of abdominal pain. She was in her usual state of health till about 6 days ago when she started to have some leg cramps, patient was seen by primary care physician and she was found to have low potassium. Patient instructed to increase her potassium intake. The same time patient says she started to have a lot of abdominal pain, the pain is 6/10, right upper quadrant, no radiation, no decreasing or increasing factors. This pain is associated with diarrhea. She said she has about 18-20 episodes of diarrhea a day. Patient had these symptoms comes and goes and she seemed like she had flare up to her colitis.  Hospital Course: Present on Admission:  .Abdominal pain, acute .Adrenal insufficiency .Diarrhea .Hypothyroid .Hypokalemia   1. 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 Patient reports that about 15-20 times bowel movements  a day. There was no blood in it. Stool studies were obtained on patient presented to the hospital and was negative. Patient has history of microscopic colitis as mentioned above she was placed on Entocort and Questran. Patient said Questran did not help her diarrhea, as these they recommended treatment for diarrhea she is taking Lomotil at home with no success. Patient wanted to try probiotics and high fiber diet too. Patient advised to try fiber supplementation which is over-the-counter like Metamucil, and see if that helps with the diarrhea has a standard treatment for the microscopic colitis not working. Her diarrhea slowed down while she is in the hospital she is having about 5-7 bowel movements a day and she said this is her regular bowel movements.   2. Hypokalemia: Patient is on 20  mEq supplementation of potassium at home because of her chronic diarrhea. I think because of her diarrhea flareup potassium went down. She was presented with potassium of 2.7. This is corrected with IV potassium supplementations. Patient also on Cortef 20 mg, it's known that excess of mineralocorticoid can cause hypokalemia and hypernatremia which she had when she came in. Patient advised to take 40 mEq twice a day for 5 more days and then switch back to 20 mEq twice a day. She voiced understanding.  3. Acute on chronic abdominal pain: Patient reported her abdominal pain secondary to her microscopic colitis, she follows with her gastroenterologist as well as her primary care physician for that. She required IV narcotics while patient in the hospital.  4. Addison disease: Patient reported that she was diagnosed with Addison disease in [facility]. Patient is on a high dose of hydrocortisone which is 20 mg twice a day and she is on Synthroid for hypothyroidism too. Her cortisol level is 1.7, which is likely to be suppressed because of the hydrocortisone. Patient mentioned that before the diagnosis of Addison disease she does not have any low blood pressure, no high potassium or low sodium, no generalized weakness. The only thing she did have is abdominal pain and chronic diarrhea. I did decrease Cortef dose to 10 mg twice a day instead of 20 mg twice a day. I recommend to refer her to endocrinologist for further management or try to taper her off of the steroids and see if she develops symptoms.  5. Hypothyroidism: Patient is on 50 mcg of Synthroid, her TSH is suppressed at 0.257, I did not decrease the dose to 25 mg, but she needs to followup with her primary care physician, excess of thyroid hormones can also cause chronic diarrhea.  Day of Discharge BP 147/95  Pulse 80  Temp(Src) 98.6 F (37 C) (Oral)  Resp 18  Ht 5\' 4"  (1.626 m)  Wt 60.691 kg (133 lb 12.8 oz)  BMI 22.97 kg/m2  SpO2 98%  LMP  07/13/2011 Physical Exam: GEN: No acute distress, cooperative with exam PSYCH: alert and oriented x4; does not appear anxious does not appear depressed; affect is normal  HEENT: Mucous membranes pink and anicteric;  Mouth: without oral thrush or lesions Eyes: PERRLA; EOM intact;  Neck: no cervical lymphadenopathy nor thyromegaly or carotid bruit; no JVD;  CHEST WALL: No tenderness, symmetrical to breathing bilaterally CHEST: Normal respiration, clear to auscultation bilaterally  HEART: Regular rate and rhythm; no murmurs, rubs or gallops, S1 and S2 heard  BACK: No kyphosis or scoliosis; no CVA tenderness  ABDOMEN:  soft non-tender; no masses, no organomegaly, normal abdominal bowel sounds; no pannus; no intertriginous candida.  EXTREMITIES:  No bone or joint deformity; no edema; no ulcerations.  PULSES: 2+ and symmetric, neurovascularity is intact SKIN: Normal hydration no rash or ulceration, no flushing or suspicious lesions  CNS: Cranial nerves 2-12 grossly intact no focal neurologic deficit, coordination is intact gait not tested    Results for orders placed during the hospital encounter of 08/07/11 (from the past 24 hour(s))  BASIC METABOLIC PANEL     Status: Abnormal   Collection Time   08/09/11 10:38 AM      Component Value Range   Sodium 138  135 - 145 (mEq/L)   Potassium 3.4 (*) 3.5 - 5.1 (mEq/L)   Chloride 107  96 - 112 (mEq/L)   CO2 24  19 - 32 (mEq/L)   Glucose, Bld 98  70 - 99 (mg/dL)   BUN 6  6 - 23 (mg/dL)   Creatinine, Ser 1.09  0.50 - 1.10 (mg/dL)   Calcium 8.9  8.4 - 60.4 (mg/dL)   GFR calc non Af Amer >90  >90 (mL/min)   GFR calc Af Amer >90  >90 (mL/min)    Disposition: Home   Follow-up Appts: Discharge Orders    Future Orders Please Complete By Expires   Increase activity slowly         Follow-up Information    Follow up with [facility], MD in 1 week.   Contact information:   [facility] [address]  [address]  [place],  [place] [address] Main: [phone] Fax: [phone]         I spent 40 minutes completing paperwork and coordinating discharge efforts.  SignedClydia Llano A 08/09/2011, 4:10 PM

## 2011-08-09 NOTE — Progress Notes (Signed)
Pt. discharged to home. Pt after visit summary reviewed and pt capable of re verbalizing medications and follow up appointments. Pt remains stable. No signs and symptoms of distress. Jiovanni Heeter E  

## 2011-08-11 LAB — STOOL CULTURE

## 2011-08-13 ENCOUNTER — Encounter (HOSPITAL_BASED_OUTPATIENT_CLINIC_OR_DEPARTMENT_OTHER): Payer: Self-pay | Admitting: Emergency Medicine

## 2011-08-13 ENCOUNTER — Emergency Department (HOSPITAL_BASED_OUTPATIENT_CLINIC_OR_DEPARTMENT_OTHER)
Admission: EM | Admit: 2011-08-13 | Discharge: 2011-08-13 | Disposition: A | Discharge status: Home / Self Care | Payer: 59 | Attending: Emergency Medicine | Admitting: Emergency Medicine

## 2011-08-13 DIAGNOSIS — K529 Noninfective gastroenteritis and colitis, unspecified: Secondary | ICD-10-CM

## 2011-08-13 DIAGNOSIS — R197 Diarrhea, unspecified: Secondary | ICD-10-CM | POA: Insufficient documentation

## 2011-08-13 DIAGNOSIS — E079 Disorder of thyroid, unspecified: Secondary | ICD-10-CM | POA: Insufficient documentation

## 2011-08-13 DIAGNOSIS — I456 Pre-excitation syndrome: Secondary | ICD-10-CM | POA: Insufficient documentation

## 2011-08-13 DIAGNOSIS — Z79899 Other long term (current) drug therapy: Secondary | ICD-10-CM | POA: Insufficient documentation

## 2011-08-13 DIAGNOSIS — R1110 Vomiting, unspecified: Secondary | ICD-10-CM | POA: Insufficient documentation

## 2011-08-13 DIAGNOSIS — K589 Irritable bowel syndrome without diarrhea: Secondary | ICD-10-CM | POA: Insufficient documentation

## 2011-08-13 DIAGNOSIS — F17200 Nicotine dependence, unspecified, uncomplicated: Secondary | ICD-10-CM | POA: Insufficient documentation

## 2011-08-13 LAB — COMPREHENSIVE METABOLIC PANEL
ALT: 11 U/L (ref 0–35)
AST: 14 U/L (ref 0–37)
Albumin: 3.7 g/dL (ref 3.5–5.2)
Calcium: 8.9 mg/dL (ref 8.4–10.5)
Creatinine, Ser: 0.8 mg/dL (ref 0.50–1.10)
Sodium: 140 mEq/L (ref 135–145)
Total Protein: 6.7 g/dL (ref 6.0–8.3)

## 2011-08-13 LAB — URINALYSIS, ROUTINE W REFLEX MICROSCOPIC
Bilirubin Urine: NEGATIVE
Specific Gravity, Urine: 1.011 (ref 1.005–1.030)
pH: 7 (ref 5.0–8.0)

## 2011-08-13 LAB — URINE MICROSCOPIC-ADD ON

## 2011-08-13 LAB — CBC
MCHC: 33.7 g/dL (ref 30.0–36.0)
MCV: 105.7 fL — ABNORMAL HIGH (ref 78.0–100.0)
Platelets: 218 10*3/uL (ref 150–400)
RDW: 12.9 % (ref 11.5–15.5)
WBC: 10.8 10*3/uL — ABNORMAL HIGH (ref 4.0–10.5)

## 2011-08-13 LAB — DIFFERENTIAL
Basophils Absolute: 0 10*3/uL (ref 0.0–0.1)
Basophils Relative: 0 % (ref 0–1)
Eosinophils Absolute: 0.3 10*3/uL (ref 0.0–0.7)
Eosinophils Relative: 3 % (ref 0–5)
Lymphocytes Relative: 18 % (ref 12–46)

## 2011-08-13 MED ORDER — PROMETHAZINE HCL 25 MG/ML IJ SOLN
25.0000 mg | Freq: Once | INTRAMUSCULAR | Status: AC
  Administered 2011-08-13: 25 mg via INTRAVENOUS
  Filled 2011-08-13: qty 1

## 2011-08-13 MED ORDER — PROMETHAZINE HCL 25 MG PO TABS: 25.0000 mg | ORAL_TABLET | Freq: Four times a day (QID) | ORAL | Status: AC | PRN

## 2011-08-13 MED ORDER — MORPHINE SULFATE 4 MG/ML IJ SOLN
4.0000 mg | Freq: Once | INTRAMUSCULAR | Status: AC
  Administered 2011-08-13: 4 mg via INTRAVENOUS
  Filled 2011-08-13: qty 1

## 2011-08-13 MED ORDER — SODIUM CHLORIDE 0.9 % IV BOLUS (SEPSIS)
1000.0000 mL | Freq: Once | INTRAVENOUS | Status: AC
  Administered 2011-08-13: 1000 mL via INTRAVENOUS

## 2011-08-13 NOTE — ED Provider Notes (Signed)
History     CSN: [id number]  Arrival date & time 08/13/11  0920   First MD Initiated Contact with Patient 08/13/11 [phone]      Chief Complaint  Patient presents with  . Diarrhea  . Emesis    (Consider location/radiation/quality/duration/timing/severity/associated sxs/prior treatment) HPI Patient is a 46 year old female with history of chronic colitis who presents today complaining of nausea, vomiting, diarrhea, and myalgias for the past 4 days. Patient was just hospitalized for similar and was discharged in improved condition on Tuesday. Patient is taking oral potassium for her symptoms. Patient is concerned that her potassium is getting low again. At the time of discharge patient was having only 3 or 4 bowel movements a day. She feels that her frequency of bowel movements has increased again up to 15-20 bowel movements a day again.  Patient has been taking her Lomotil as well as Metamucil as prescribed. Patient has had multiple negative tests for C. difficile. She reports that in the past she's been treated with Cipro and Flagyl for colitis but her gastroenterologist no longer wants her treated for this because she has had C. difficile times. Patient reports that her pain is 6/10. It is a cramping lower abdominal pain. There are no other associated or modifying factors. Past Medical History  Diagnosis Date  . Colitis   . WPW (Wolff-Parkinson-White syndrome)   . Kidney calculi   . Kidney stone   . Addison disease   . Thyroid disease   . Clostridium difficile diarrhea   . Anxiety and depression   . Chronic diarrhea   . Chronic back pain   . Drug-seeking behavior   . Irritable bowel syndrome (IBS)   . Anemia   . Colitis   . Addison's disease     Past Surgical History  Procedure Date  . Cholecystectomy   . Tubal ligation   . Lithotripsy   . Sphincterotomy   . Cardiac electrophysiology mapping and ablation     for WPW    Family History  Problem Relation Age of Onset  .  Colon cancer Neg Hx     History  Substance Use Topics  . Smoking status: Current Everyday Smoker -- 1.0 packs/day for 30 years    Types: Cigarettes  . Smokeless tobacco: Never Used  . Alcohol Use: No    OB History    Grav Para Term Preterm Abortions TAB SAB Ect Mult Living   3 3              Review of Systems  Constitutional: Positive for appetite change and fatigue.  HENT: Negative.   Eyes: Negative.   Respiratory: Negative.   Cardiovascular: Negative.   Gastrointestinal: Positive for nausea, vomiting, abdominal pain and diarrhea.  Genitourinary: Negative.   Musculoskeletal: Negative.   Skin: Negative.   Neurological: Negative.   Hematological: Negative.   Psychiatric/Behavioral: Negative.   All other systems reviewed and are negative.    Allergies  Ambien; Nsaids; and Zofran  Home Medications   Current Outpatient Rx  Name Route Sig Dispense Refill  . BUDESONIDE ER 3 MG PO CP24 Oral Take 9 mg by mouth every morning.     Marland Kitchen CLONAZEPAM 1 MG PO TABS Oral Take 1 mg by mouth 4 (four) times daily as needed. anxiety    . DIPHENOXYLATE-ATROPINE 2.5-0.025 MG PO TABS Oral Take 1 tablet by mouth 4 (four) times daily as needed. For diarrhea or loose stools.    Marland Kitchen HYDROCORTISONE 10 MG PO TABS Oral Take  1 tablet (10 mg total) by mouth 2 (two) times daily. 60 tablet 0  . LEVOTHYROXINE SODIUM 50 MCG PO TABS Oral Take 50 mcg by mouth daily.      . ADULT MULTIVITAMIN W/MINERALS CH Oral Take 1 tablet by mouth daily.      Marland Kitchen NORTRIPTYLINE HCL 25 MG PO CAPS Oral Take 25 mg by mouth at bedtime.    Marland Kitchen POTASSIUM CHLORIDE CRYS ER 20 MEQ PO TBCR Oral Take 2 tablets (40 mEq total) by mouth 2 (two) times daily.  0  . PROMETHAZINE HCL 25 MG PO TABS Oral Take 1 tablet (25 mg total) by mouth every 6 (six) hours as needed for nausea. 30 tablet 0  . RANITIDINE HCL 150 MG PO CAPS Oral Take 1 capsule (150 mg total) by mouth daily. 30 capsule 0  . SUCRALFATE 1 GM/10ML PO SUSP Oral Take 10 mLs (1 g total)  by mouth 4 (four) times daily. 420 mL 1    BP 160/108  Pulse 105  Temp(Src) 98.2 F (36.8 C) (Oral)  Resp 16  Ht 5\' 4"  (1.626 m)  Wt 133 lb (60.328 kg)  BMI 22.83 kg/m2  SpO2 100%  LMP 07/13/2011  Physical Exam  Nursing note and vitals reviewed. GEN: Well-developed, well-nourished female in no distress HEENT: Atraumatic, normocephalic. Oropharynx clear without erythema EYES: PERRLA BL, no scleral icterus. NECK: Trachea midline, no meningismus CV: regular rate and rhythm. No murmurs, rubs, or gallops PULM: No respiratory distress.  No crackles, wheezes, or rales. GI: soft, mild diffuse tenderness to palpation No guarding, rebound. + bowel sounds  GU: deferred Neuro: cranial nerves 2-12 intact, no abnormalities of strength or sensation, A and O x 3 MSK: Patient moves all 4 extremities symmetrically, no deformity, edema, or injury noted Skin: No rashes petechiae, purpura, or jaundice Psych: no abnormality of mood   ED Course  Procedures (including critical care time)  Labs Reviewed  CBC - Abnormal; Notable for the following:    WBC 10.8 (*)    RBC 3.51 (*)    MCV 105.7 (*)    MCH 35.6 (*)    All other components within normal limits  COMPREHENSIVE METABOLIC PANEL - Abnormal; Notable for the following:    Total Bilirubin 0.2 (*)    GFR calc non Af Amer 88 (*)    All other components within normal limits  URINALYSIS, ROUTINE W REFLEX MICROSCOPIC - Abnormal; Notable for the following:    Hgb urine dipstick MODERATE (*)    All other components within normal limits  URINE MICROSCOPIC-ADD ON - Abnormal; Notable for the following:    Squamous Epithelial / LPF FEW (*)    Bacteria, UA MANY (*)    All other components within normal limits  DIFFERENTIAL   No results found.   1. Chronic diarrhea       MDM  Patient was evaluated by myself. She was treated with IV fluids as well as a dose of pain medication and nausea medication. Patient had only a minimal leukocytosis of  10.8. She had no anemia and no renal compromise. Urinalysis was unremarkable. Patient did not have any hypokalemia today. She was notified of this. Patient was feeling better. She was able to be discharged in good condition. She was given a prescription for Phenergan and discharged in good condition.        Cyndra Numbers, MD 08/14/11 [phone]

## 2011-08-13 NOTE — Discharge Instructions (Signed)
Chronic Diarrhea Diarrhea is loose, watery stools. Having diarrhea means passing loose stools 3 or more times a day. Diarrhea that lasts longer than 4 weeks is considered long-lasting (chronic). Symptoms of chronic diarrhea may be continual or may come and go. People of all ages can get diarrhea. Body fluid loss (dehydration) may occur as a result of diarrhea. This means the body does not have as many fluids and salts (electrolytes) as it needs. CAUSES  There are many causes of chronic diarrhea. Causes may be different for children and adults. The various causes can be grouped into 2 categories: diarrhea caused by an infection and diarrhea not caused by an infection. Sometimes, the cause is unknown. Diarrhea caused by an infection may result from:  Parasites.   Bacteria.   Viral infections.  Diarrhea not caused by an infection may result from:  Irritable bowel syndrome.   Reaction to medicines, such as antibiotics, cancer drugs, blood pressure medicines, and antacids.   Intestinal disease (Crohn's disease, ulcerative colitis, celiac disease).   Food allergies or sensitivity to additives (fructose, lactose, sugar substitutes).   Tumors.   Diabetes, thyroid disease, and other endocrine diseases.   Reduced blood flow to the intestine.   Previous surgery or radiation of the abdomen or gastrointestinal tract.  Risk factors for chronic diarrhea include:  Having a severely weakened immune system, such as from HIV/AIDS.   Taking certain types of cancer-fighting drugs (chemotherapy) or other medicines.   A recent organ transplant.   Having a portion of the stomach removed.   Traveling to countries where food and water supplies are often contaminated.  SYMPTOMS  In addition to frequent, loose stools, diarrhea may cause:  Cramping.   Abdominal pain.   Nausea.   Urgent need to use the bathroom, or loss of bowel control.  If dehydration occurs, problems include:  Thirst.    Less frequent urination.   Dark urine.   Dry skin.   Fatigue.   Dizziness.  Infections that cause diarrhea may also cause a fever, chills, or bloody stools. DIAGNOSIS  Diagnosis may be difficult. Your caregiver must take a careful history and perform a physical exam. Tests given are based on your symptoms and history. Tests may include:  Blood or stool tests, in which 3 or more stool samples may be examined. Stool cultures may be used to test for bacteria or parasites.   X-rays.   A procedure in which a thin tube is inserted into the mouth or rectum (endoscopy). This allows the caregiver to look inside the intestine.  TREATMENT   Diarrhea caused by an infection can often be treated with antibiotics.   Diarrhea not caused by an infection is more difficult to diagnose and treat. Long-term medicine use or surgery may be required. Specific treatment should be discussed with your caregiver.   If the cause cannot determined, treatment to relieve symptoms includes:   Preventing dehydration. Serious health problems can occur if you do not maintain proper fluid levels. Many oral rehydration solutions (ORS) are available at drug stores. Ask your caregiver what product is best for you.   Not drinking beverages that contain caffeine (tea, coffee, soft drinks).   Not drinking alcohol. It causes dehydration.   Not relying on sports drinks and broths alone to maintain proper fluid levels. They should not be used to prevent severe dehydration.   Maintaining well-balanced nutrition. This may help you recover faster.  PREVENTION   Drink clean or purified water.   Use proper   food handling techniques.   Maintain proper hand-washing habits.  HOME CARE INSTRUCTIONS   Avoid:   Caffeine.   Greasy foods.   High fiber.   If you have problems digesting lactose during or after an episode of diarrhea, you might want to try yogurt. Yogurt is often better tolerated, because it has less  lactose than milk. Yogurt with active, live bacterial cultures may even help you recover faster.  SEEK MEDICAL CARE IF:  The person with diarrhea is an otherwise healthy adult and has:  Signs of dehydration.   Diarrhea for more than 2 days.   Severe pain in the abdomen or rectum.   An oral temperature above 102 F (38.9 C).   Stools containing blood or pus.   Stools that are black and tarry.  SEEK IMMEDIATE MEDICAL CARE IF:  The person with diarrhea is a child, elderly person, or has a weakened immune system and has:  Signs of dehydration.   Diarrhea for more than 1 day.   Severe pain in the abdomen or rectum.   An oral temperature above 102 F (38.9 C), not controlled by medicine.   Stools containing blood or pus.   Stools that are black and tarry.  Document Released: 06/11/2003 Document Revised: 03/10/2011 Document Reviewed: 08/07/2009 [employer] Patient Information 2012 [employer]. 

## 2011-08-13 NOTE — ED Notes (Signed)
Pt c/o NVD, muscle cramps since Thurs- was d/c'd from [facility] Tues for same (and hypokalemia).

## 2011-08-22 ENCOUNTER — Encounter (HOSPITAL_BASED_OUTPATIENT_CLINIC_OR_DEPARTMENT_OTHER): Payer: Self-pay | Admitting: Family Medicine

## 2011-08-22 ENCOUNTER — Emergency Department (HOSPITAL_BASED_OUTPATIENT_CLINIC_OR_DEPARTMENT_OTHER)
Admission: EM | Admit: 2011-08-22 | Discharge: 2011-08-22 | Disposition: A | Discharge status: Home / Self Care | Payer: 59 | Attending: Emergency Medicine | Admitting: Emergency Medicine

## 2011-08-22 DIAGNOSIS — R10819 Abdominal tenderness, unspecified site: Secondary | ICD-10-CM | POA: Insufficient documentation

## 2011-08-22 DIAGNOSIS — E079 Disorder of thyroid, unspecified: Secondary | ICD-10-CM | POA: Insufficient documentation

## 2011-08-22 DIAGNOSIS — R42 Dizziness and giddiness: Secondary | ICD-10-CM | POA: Insufficient documentation

## 2011-08-22 DIAGNOSIS — R197 Diarrhea, unspecified: Secondary | ICD-10-CM | POA: Insufficient documentation

## 2011-08-22 DIAGNOSIS — R112 Nausea with vomiting, unspecified: Secondary | ICD-10-CM | POA: Insufficient documentation

## 2011-08-22 DIAGNOSIS — E876 Hypokalemia: Secondary | ICD-10-CM | POA: Insufficient documentation

## 2011-08-22 DIAGNOSIS — K589 Irritable bowel syndrome without diarrhea: Secondary | ICD-10-CM | POA: Insufficient documentation

## 2011-08-22 DIAGNOSIS — R109 Unspecified abdominal pain: Secondary | ICD-10-CM | POA: Insufficient documentation

## 2011-08-22 DIAGNOSIS — Z87442 Personal history of urinary calculi: Secondary | ICD-10-CM | POA: Insufficient documentation

## 2011-08-22 DIAGNOSIS — R55 Syncope and collapse: Secondary | ICD-10-CM | POA: Insufficient documentation

## 2011-08-22 DIAGNOSIS — F17200 Nicotine dependence, unspecified, uncomplicated: Secondary | ICD-10-CM | POA: Insufficient documentation

## 2011-08-22 LAB — COMPREHENSIVE METABOLIC PANEL
ALT: 9 U/L (ref 0–35)
AST: 18 U/L (ref 0–37)
Alkaline Phosphatase: 109 U/L (ref 39–117)
Calcium: 9.1 mg/dL (ref 8.4–10.5)
Glucose, Bld: 87 mg/dL (ref 70–99)
Potassium: 3.1 mEq/L — ABNORMAL LOW (ref 3.5–5.1)
Sodium: 137 mEq/L (ref 135–145)
Total Protein: 7.1 g/dL (ref 6.0–8.3)

## 2011-08-22 LAB — CBC
MCH: 35.9 pg — ABNORMAL HIGH (ref 26.0–34.0)
MCV: 102.6 fL — ABNORMAL HIGH (ref 78.0–100.0)
Platelets: 344 10*3/uL (ref 150–400)
RBC: 3.43 MIL/uL — ABNORMAL LOW (ref 3.87–5.11)
RDW: 12.3 % (ref 11.5–15.5)
WBC: 9.7 10*3/uL (ref 4.0–10.5)

## 2011-08-22 LAB — URINALYSIS, ROUTINE W REFLEX MICROSCOPIC
Bilirubin Urine: NEGATIVE
Glucose, UA: NEGATIVE mg/dL
Hgb urine dipstick: NEGATIVE
Ketones, ur: NEGATIVE mg/dL
Specific Gravity, Urine: 1.012 (ref 1.005–1.030)
pH: 6.5 (ref 5.0–8.0)

## 2011-08-22 LAB — DIFFERENTIAL
Basophils Absolute: 0 10*3/uL (ref 0.0–0.1)
Eosinophils Absolute: 0.5 10*3/uL (ref 0.0–0.7)
Eosinophils Relative: 5 % (ref 0–5)
Lymphocytes Relative: 11 % — ABNORMAL LOW (ref 12–46)
Lymphs Abs: 1.1 10*3/uL (ref 0.7–4.0)
Neutrophils Relative %: 76 % (ref 43–77)

## 2011-08-22 MED ORDER — TRAMADOL HCL 50 MG PO TABS: 50.0000 mg | ORAL_TABLET | Freq: Four times a day (QID) | ORAL | Status: AC | PRN

## 2011-08-22 MED ORDER — SODIUM CHLORIDE 0.9 % IV BOLUS (SEPSIS)
1000.0000 mL | Freq: Once | INTRAVENOUS | Status: AC
  Administered 2011-08-22: 1000 mL via INTRAVENOUS

## 2011-08-22 MED ORDER — METOCLOPRAMIDE HCL 10 MG PO TABS: 10.0000 mg | ORAL_TABLET | Freq: Four times a day (QID) | ORAL | Status: DC

## 2011-08-22 MED ORDER — FENTANYL CITRATE 0.05 MG/ML IJ SOLN
50.0000 ug | Freq: Once | INTRAMUSCULAR | Status: AC
  Administered 2011-08-22: 50 ug via INTRAVENOUS
  Filled 2011-08-22: qty 2

## 2011-08-22 MED ORDER — LOPERAMIDE HCL 2 MG PO CAPS
2.0000 mg | ORAL_CAPSULE | ORAL | Status: DC
  Administered 2011-08-22: 2 mg via ORAL
  Filled 2011-08-22: qty 1

## 2011-08-22 MED ORDER — METOCLOPRAMIDE HCL 5 MG/ML IJ SOLN
10.0000 mg | Freq: Once | INTRAMUSCULAR | Status: AC
  Administered 2011-08-22: 10 mg via INTRAVENOUS
  Filled 2011-08-22: qty 2

## 2011-08-22 MED ORDER — ONDANSETRON HCL 4 MG/2ML IJ SOLN
4.0000 mg | Freq: Once | INTRAMUSCULAR | Status: AC
  Administered 2011-08-22: 4 mg via INTRAVENOUS
  Filled 2011-08-22: qty 2

## 2011-08-22 MED ORDER — HYDROMORPHONE HCL PF 1 MG/ML IJ SOLN
1.0000 mg | Freq: Once | INTRAMUSCULAR | Status: AC
  Administered 2011-08-22: 1 mg via INTRAVENOUS
  Filled 2011-08-22: qty 1

## 2011-08-22 NOTE — ED Provider Notes (Signed)
History     CSN: [id number]  Arrival date & time 08/22/11  1150   First MD Initiated Contact with Patient 08/22/11 1404     2:56 PM HPI Patient reports diarrhea, nausea, vomiting or 4 days. Reports today she also began to develop a squeezing diffuse abdominal pain. States that she was at work today she became extremely dizzy and lightheaded and slid out of her chair. Denies full loss of consciousness. Reports patient has a significant history of colitis and this feels as if it may a flareup. Denies blood in stool. Reports stool is mucousy. Denies dysuria, hematuria, fever, headache, numbness, tingling, weakness. Patient is a 46 y.o. female presenting with abdominal pain. The history is provided by the patient.  Abdominal Pain The primary symptoms of the illness include abdominal pain, nausea, vomiting and diarrhea. The primary symptoms of the illness do not include fever, fatigue, shortness of breath, hematemesis, hematochezia, dysuria, vaginal discharge or vaginal bleeding. The current episode started more than 2 days ago. The onset of the illness was gradual. The problem has been gradually worsening.  The abdominal pain is generalized. The abdominal pain does not radiate. The severity of the abdominal pain is 7/10. The abdominal pain is relieved by nothing.  The diarrhea began more than 1 week ago. The diarrhea is watery and mucous.  The patient states that she believes she is currently not pregnant. The patient has not had a change in bowel habit. Symptoms associated with the illness do not include chills, anorexia, diaphoresis, heartburn, constipation, urgency, hematuria or frequency. Associated medical issues comments: Significant history of colitis.    Past Medical History  Diagnosis Date  . Colitis   . WPW (Wolff-Parkinson-White syndrome)   . Kidney calculi   . Kidney stone   . Addison disease   . Thyroid disease   . Clostridium difficile diarrhea   . Anxiety and depression   .  Chronic diarrhea   . Chronic back pain   . Drug-seeking behavior   . Irritable bowel syndrome (IBS)   . Anemia   . Colitis   . Addison's disease     Past Surgical History  Procedure Date  . Cholecystectomy   . Tubal ligation   . Lithotripsy   . Sphincterotomy   . Cardiac electrophysiology mapping and ablation     for WPW    Family History  Problem Relation Age of Onset  . Colon cancer Neg Hx     History  Substance Use Topics  . Smoking status: Current Everyday Smoker -- 1.0 packs/day for 30 years    Types: Cigarettes  . Smokeless tobacco: Never Used  . Alcohol Use: No    OB History    Grav Para Term Preterm Abortions TAB SAB Ect Mult Living   3 3              Review of Systems  Constitutional: Negative for fever, chills, diaphoresis and fatigue.  Respiratory: Negative for shortness of breath.   Gastrointestinal: Positive for nausea, vomiting, abdominal pain and diarrhea. Negative for heartburn, constipation, hematochezia, anorexia and hematemesis.  Genitourinary: Negative for dysuria, urgency, frequency, hematuria, vaginal bleeding and vaginal discharge.  Neurological: Positive for dizziness and light-headedness. Negative for weakness, numbness and headaches. Syncope: near syncope.  All other systems reviewed and are negative.    Allergies  Ambien; Nsaids; and Zofran  Home Medications   Current Outpatient Rx  Name Route Sig Dispense Refill  . BUDESONIDE ER 3 MG PO CP24 Oral Take  9 mg by mouth every morning.     Marland Kitchen CLONAZEPAM 1 MG PO TABS Oral Take 1 mg by mouth 4 (four) times daily as needed. anxiety    . HYDROCORTISONE 10 MG PO TABS Oral Take 1 tablet (10 mg total) by mouth 2 (two) times daily. 60 tablet 0  . LEVOTHYROXINE SODIUM 50 MCG PO TABS Oral Take 50 mcg by mouth daily.      . ADULT MULTIVITAMIN W/MINERALS CH Oral Take 1 tablet by mouth daily.      Marland Kitchen NORTRIPTYLINE HCL 25 MG PO CAPS Oral Take 25 mg by mouth at bedtime.    Marland Kitchen POTASSIUM CHLORIDE CRYS ER  20 MEQ PO TBCR Oral Take 2 tablets (40 mEq total) by mouth 2 (two) times daily.  0  . RANITIDINE HCL 150 MG PO CAPS Oral Take 1 capsule (150 mg total) by mouth daily. 30 capsule 0  . SUCRALFATE 1 GM/10ML PO SUSP Oral Take 10 mLs (1 g total) by mouth 4 (four) times daily. 420 mL 1    BP 109/78  Pulse 98  Temp(Src) 98.3 F (36.8 C) (Oral)  Resp 20  SpO2 100%  LMP 07/13/2011  Physical Exam  Vitals reviewed. Constitutional: She is oriented to person, place, and time. Vital signs are normal. She appears well-developed and well-nourished.  HENT:  Head: Normocephalic and atraumatic.  Eyes: Conjunctivae are normal. Pupils are equal, round, and reactive to light.  Neck: Normal range of motion. Neck supple.  Cardiovascular: Normal rate, regular rhythm and normal heart sounds.  Exam reveals no friction rub.   No murmur heard. Pulmonary/Chest: Effort normal and breath sounds normal. She has no wheezes. She has no rhonchi. She has no rales. She exhibits no tenderness.  Abdominal: She exhibits no distension and no mass. There is no hepatosplenomegaly. There is tenderness (diffuse). There is no rigidity, no rebound, no guarding, no CVA tenderness, no tenderness at McBurney's point and negative Murphy's sign.  Musculoskeletal: Normal range of motion.  Neurological: She is alert and oriented to person, place, and time. No cranial nerve deficit (tested CN III-XII). Coordination normal.  Skin: Skin is warm and dry. No rash noted. No erythema. No pallor.    ED Course  Procedures Results for orders placed during the hospital encounter of 08/22/11  CBC      Component Value Range   WBC 9.7  4.0 - 10.5 (K/uL)   RBC 3.43 (*) 3.87 - 5.11 (MIL/uL)   Hemoglobin 12.3  12.0 - 15.0 (g/dL)   HCT 16.1 (*) 09.6 - 46.0 (%)   MCV 102.6 (*) 78.0 - 100.0 (fL)   MCH 35.9 (*) 26.0 - 34.0 (pg)   MCHC 34.9  30.0 - 36.0 (g/dL)   RDW 04.5  40.9 - 81.1 (%)   Platelets 344  150 - 400 (K/uL)  DIFFERENTIAL       Component Value Range   Neutrophils Relative 76  43 - 77 (%)   Neutro Abs 7.3  1.7 - 7.7 (K/uL)   Lymphocytes Relative 11 (*) 12 - 46 (%)   Lymphs Abs 1.1  0.7 - 4.0 (K/uL)   Monocytes Relative 8  3 - 12 (%)   Monocytes Absolute 0.8  0.1 - 1.0 (K/uL)   Eosinophils Relative 5  0 - 5 (%)   Eosinophils Absolute 0.5  0.0 - 0.7 (K/uL)   Basophils Relative 0  0 - 1 (%)   Basophils Absolute 0.0  0.0 - 0.1 (K/uL)  COMPREHENSIVE METABOLIC PANEL  Component Value Range   Sodium 137  135 - 145 (mEq/L)   Potassium 3.1 (*) 3.5 - 5.1 (mEq/L)   Chloride 100  96 - 112 (mEq/L)   CO2 25  19 - 32 (mEq/L)   Glucose, Bld 87  70 - 99 (mg/dL)   BUN 8  6 - 23 (mg/dL)   Creatinine, Ser 1.61  0.50 - 1.10 (mg/dL)   Calcium 9.1  8.4 - 09.6 (mg/dL)   Total Protein 7.1  6.0 - 8.3 (g/dL)   Albumin 3.4 (*) 3.5 - 5.2 (g/dL)   AST 18  0 - 37 (U/L)   ALT 9  0 - 35 (U/L)   Alkaline Phosphatase 109  39 - 117 (U/L)   Total Bilirubin 0.2 (*) 0.3 - 1.2 (mg/dL)   GFR calc non Af Amer >90  >90 (mL/min)   GFR calc Af Amer >90  >90 (mL/min)  URINALYSIS, ROUTINE W REFLEX MICROSCOPIC      Component Value Range   Color, Urine YELLOW  YELLOW    APPearance HAZY (*) CLEAR    Specific Gravity, Urine 1.012  1.005 - 1.030    pH 6.5  5.0 - 8.0    Glucose, UA NEGATIVE  NEGATIVE (mg/dL)   Hgb urine dipstick NEGATIVE  NEGATIVE    Bilirubin Urine NEGATIVE  NEGATIVE    Ketones, ur NEGATIVE  NEGATIVE (mg/dL)   Protein, ur NEGATIVE  NEGATIVE (mg/dL)   Urobilinogen, UA 0.2  0.0 - 1.0 (mg/dL)   Nitrite NEGATIVE  NEGATIVE    Leukocytes, UA NEGATIVE  NEGATIVE   PREGNANCY, URINE      Component Value Range   Preg Test, Ur NEGATIVE  NEGATIVE     MDM    3:22 PM Ptient is not orthostatic, or dehydrated on labs. Hypokalemic. Patient reports a history of this and takes 40 potassium BID. Pending symptoms and only in stool collection . 4:06 PM Pt has had no relief. Will continue to attempt symptoms relief  4:52 PM Reports  improvement of symptoms. No acute findings on labs. Will d/c with rx reglan. PO tolerated. Patient is able to have close f/u with PCP. Reports ready for d/c  Thomasene Lot, PA-C 08/22/11 1710

## 2011-08-22 NOTE — ED Notes (Signed)
Pt was up to [facility]-small amt diarrhea-EMT was able to collect stool sample x 1-pt back up to [facility]-no diarrhea

## 2011-08-22 NOTE — ED Notes (Signed)
Pt up to [facility]-diarrheal stool-specimens collected/sent

## 2011-08-22 NOTE — Discharge Instructions (Signed)

## 2011-08-22 NOTE — ED Notes (Signed)
Pt states her ride is here and she is ready to go-[facility]PA notified

## 2011-08-22 NOTE — ED Notes (Signed)
Pt c/o diarrhea and vomiting since Friday. Pt sts she went to work today and felt "dizzy and slid out of chair". Pt unsure if she had loc or not. Pt c/o diffuse abdominal pain "squeezing".

## 2011-08-23 LAB — OVA AND PARASITE EXAMINATION

## 2011-08-23 LAB — CLOSTRIDIUM DIFFICILE BY PCR: Toxigenic C. Difficile by PCR: NEGATIVE

## 2011-08-26 LAB — STOOL CULTURE

## 2011-08-27 ENCOUNTER — Encounter (HOSPITAL_BASED_OUTPATIENT_CLINIC_OR_DEPARTMENT_OTHER): Payer: Self-pay | Admitting: Emergency Medicine

## 2011-08-27 ENCOUNTER — Emergency Department (HOSPITAL_BASED_OUTPATIENT_CLINIC_OR_DEPARTMENT_OTHER): Payer: 59

## 2011-08-27 ENCOUNTER — Inpatient Hospital Stay (HOSPITAL_BASED_OUTPATIENT_CLINIC_OR_DEPARTMENT_OTHER)
Admission: EM | Admit: 2011-08-27 | Discharge: 2011-09-02 | DRG: 194 | Disposition: A | Discharge status: Home / Self Care | Payer: 59 | Attending: Internal Medicine | Admitting: Internal Medicine

## 2011-08-27 DIAGNOSIS — F411 Generalized anxiety disorder: Secondary | ICD-10-CM

## 2011-08-27 DIAGNOSIS — E2740 Unspecified adrenocortical insufficiency: Secondary | ICD-10-CM | POA: Diagnosis present

## 2011-08-27 DIAGNOSIS — R634 Abnormal weight loss: Secondary | ICD-10-CM

## 2011-08-27 DIAGNOSIS — K5289 Other specified noninfective gastroenteritis and colitis: Secondary | ICD-10-CM | POA: Diagnosis present

## 2011-08-27 DIAGNOSIS — J209 Acute bronchitis, unspecified: Secondary | ICD-10-CM

## 2011-08-27 DIAGNOSIS — F3289 Other specified depressive episodes: Secondary | ICD-10-CM

## 2011-08-27 DIAGNOSIS — K2900 Acute gastritis without bleeding: Secondary | ICD-10-CM

## 2011-08-27 DIAGNOSIS — J679 Hypersensitivity pneumonitis due to unspecified organic dust: Secondary | ICD-10-CM | POA: Diagnosis present

## 2011-08-27 DIAGNOSIS — E2749 Other adrenocortical insufficiency: Secondary | ICD-10-CM | POA: Diagnosis present

## 2011-08-27 DIAGNOSIS — R1110 Vomiting, unspecified: Secondary | ICD-10-CM

## 2011-08-27 DIAGNOSIS — K52832 Lymphocytic colitis: Secondary | ICD-10-CM

## 2011-08-27 DIAGNOSIS — K52831 Collagenous colitis: Secondary | ICD-10-CM | POA: Diagnosis present

## 2011-08-27 DIAGNOSIS — J189 Pneumonia, unspecified organism: Principal | ICD-10-CM | POA: Diagnosis present

## 2011-08-27 DIAGNOSIS — D72829 Elevated white blood cell count, unspecified: Secondary | ICD-10-CM

## 2011-08-27 DIAGNOSIS — G894 Chronic pain syndrome: Secondary | ICD-10-CM | POA: Diagnosis present

## 2011-08-27 DIAGNOSIS — E876 Hypokalemia: Secondary | ICD-10-CM | POA: Diagnosis present

## 2011-08-27 DIAGNOSIS — E860 Dehydration: Secondary | ICD-10-CM

## 2011-08-27 DIAGNOSIS — R109 Unspecified abdominal pain: Secondary | ICD-10-CM

## 2011-08-27 DIAGNOSIS — M940 Chondrocostal junction syndrome [Tietze]: Secondary | ICD-10-CM

## 2011-08-27 DIAGNOSIS — Z862 Personal history of diseases of the blood and blood-forming organs and certain disorders involving the immune mechanism: Secondary | ICD-10-CM

## 2011-08-27 DIAGNOSIS — J8410 Pulmonary fibrosis, unspecified: Secondary | ICD-10-CM

## 2011-08-27 DIAGNOSIS — K529 Noninfective gastroenteritis and colitis, unspecified: Secondary | ICD-10-CM | POA: Diagnosis present

## 2011-08-27 DIAGNOSIS — I456 Pre-excitation syndrome: Secondary | ICD-10-CM

## 2011-08-27 DIAGNOSIS — F329 Major depressive disorder, single episode, unspecified: Secondary | ICD-10-CM | POA: Diagnosis present

## 2011-08-27 DIAGNOSIS — R197 Diarrhea, unspecified: Secondary | ICD-10-CM

## 2011-08-27 DIAGNOSIS — E039 Hypothyroidism, unspecified: Secondary | ICD-10-CM

## 2011-08-27 DIAGNOSIS — R079 Chest pain, unspecified: Secondary | ICD-10-CM | POA: Diagnosis present

## 2011-08-27 HISTORY — DX: Collagenous colitis: K52.831

## 2011-08-27 LAB — URINE MICROSCOPIC-ADD ON

## 2011-08-27 LAB — URINALYSIS, ROUTINE W REFLEX MICROSCOPIC
Ketones, ur: 15 mg/dL — AB
Leukocytes, UA: NEGATIVE
Nitrite: NEGATIVE
Protein, ur: 30 mg/dL — AB

## 2011-08-27 LAB — CBC
HCT: 35 % — ABNORMAL LOW (ref 36.0–46.0)
Hemoglobin: 12.2 g/dL (ref 12.0–15.0)
MCHC: 34.9 g/dL (ref 30.0–36.0)
MCV: 100 fL (ref 78.0–100.0)
RDW: 12.5 % (ref 11.5–15.5)

## 2011-08-27 LAB — DIFFERENTIAL
Basophils Relative: 0 % (ref 0–1)
Eosinophils Relative: 6 % — ABNORMAL HIGH (ref 0–5)
Monocytes Absolute: 0.8 10*3/uL (ref 0.1–1.0)
Monocytes Relative: 8 % (ref 3–12)
Neutro Abs: 7.2 10*3/uL (ref 1.7–7.7)

## 2011-08-27 LAB — COMPREHENSIVE METABOLIC PANEL
Albumin: 3 g/dL — ABNORMAL LOW (ref 3.5–5.2)
BUN: 12 mg/dL (ref 6–23)
CO2: 27 mEq/L (ref 19–32)
Calcium: 9.1 mg/dL (ref 8.4–10.5)
Chloride: 101 mEq/L (ref 96–112)
Creatinine, Ser: 0.6 mg/dL (ref 0.50–1.10)
GFR calc non Af Amer: >90 mL/min (ref >90)
Total Bilirubin: 0.1 mg/dL — ABNORMAL LOW (ref 0.3–1.2)

## 2011-08-27 LAB — LIPASE, BLOOD: Lipase: 62 U/L — ABNORMAL HIGH (ref 11–59)

## 2011-08-27 MED ORDER — VANCOMYCIN HCL IN DEXTROSE 1-5 GM/200ML-% IV SOLN
1000.0000 mg | Freq: Once | INTRAVENOUS | Status: AC
  Administered 2011-08-27: 1000 mg via INTRAVENOUS
  Filled 2011-08-27: qty 200

## 2011-08-27 MED ORDER — SODIUM CHLORIDE 0.9 % IV SOLN
INTRAVENOUS | Status: AC
  Administered 2011-08-27: 23:00:00 via INTRAVENOUS

## 2011-08-27 MED ORDER — HYDROMORPHONE HCL PF 1 MG/ML IJ SOLN
1.0000 mg | Freq: Once | INTRAMUSCULAR | Status: AC
  Administered 2011-08-27: 1 mg via INTRAVENOUS
  Filled 2011-08-27: qty 1

## 2011-08-27 MED ORDER — PIPERACILLIN-TAZOBACTAM 3.375 G IVPB: 3.3750 g | Freq: Once | INTRAVENOUS | Status: DC

## 2011-08-27 MED ORDER — HYDROMORPHONE HCL PF 1 MG/ML IJ SOLN
1.0000 mg | Freq: Once | INTRAMUSCULAR | Status: AC
  Administered 2011-08-27: 1 mg via INTRAVENOUS

## 2011-08-27 MED ORDER — POTASSIUM CHLORIDE 10 MEQ/100ML IV SOLN
10.0000 meq | Freq: Once | INTRAVENOUS | Status: AC
  Administered 2011-08-27: 10 meq via INTRAVENOUS
  Filled 2011-08-27: qty 100

## 2011-08-27 MED ORDER — PROMETHAZINE HCL 25 MG/ML IJ SOLN
12.5000 mg | Freq: Once | INTRAMUSCULAR | Status: AC
  Administered 2011-08-27: 21:00:00 via INTRAVENOUS
  Filled 2011-08-27: qty 1

## 2011-08-27 MED ORDER — PROMETHAZINE HCL 25 MG/ML IJ SOLN: 12.5000 mg | Freq: Once | INTRAMUSCULAR | Status: DC

## 2011-08-27 MED ORDER — PROMETHAZINE HCL 25 MG PO TABS: 12.5000 mg | ORAL_TABLET | Freq: Four times a day (QID) | ORAL | Status: DC | PRN

## 2011-08-27 MED ORDER — LORAZEPAM 1 MG PO TABS
1.0000 mg | ORAL_TABLET | Freq: Once | ORAL | Status: AC
  Administered 2011-08-27: 1 mg via ORAL
  Filled 2011-08-27: qty 1

## 2011-08-27 MED ORDER — LORAZEPAM 0.5 MG PO TABS
0.5000 mg | ORAL_TABLET | Freq: Every evening | ORAL | Status: DC | PRN
  Administered 2011-08-28: 0.5 mg via ORAL
  Filled 2011-08-27: qty 1

## 2011-08-27 MED ORDER — HYDROMORPHONE HCL PF 1 MG/ML IJ SOLN: 1.0000 mg | INTRAMUSCULAR | Status: AC | PRN

## 2011-08-27 MED ORDER — IOHEXOL 350 MG/ML SOLN
100.0000 mL | Freq: Once | INTRAVENOUS | Status: AC | PRN
  Administered 2011-08-27: 100 mL via INTRAVENOUS

## 2011-08-27 MED ORDER — METOCLOPRAMIDE HCL 5 MG/ML IJ SOLN
10.0000 mg | Freq: Once | INTRAMUSCULAR | Status: AC
  Administered 2011-08-27: 10 mg via INTRAVENOUS
  Filled 2011-08-27: qty 2

## 2011-08-27 MED ORDER — IOHEXOL 300 MG/ML  SOLN: 20.0000 mL | INTRAMUSCULAR | Status: AC

## 2011-08-27 MED ORDER — IOHEXOL 300 MG/ML  SOLN
100.0000 mL | Freq: Once | INTRAMUSCULAR | Status: AC | PRN
  Administered 2011-08-27: 100 mL via INTRAVENOUS

## 2011-08-27 MED ORDER — PROMETHAZINE HCL 25 MG/ML IJ SOLN
12.5000 mg | Freq: Once | INTRAMUSCULAR | Status: AC
  Administered 2011-08-27: 12.5 mg via INTRAVENOUS
  Filled 2011-08-27: qty 1

## 2011-08-27 MED ORDER — HYDROMORPHONE HCL PF 1 MG/ML IJ SOLN
INTRAMUSCULAR | Status: AC
  Filled 2011-08-27: qty 1

## 2011-08-27 NOTE — ED Provider Notes (Signed)
History     CSN: [id number]  Arrival date & time 08/27/11  1142   First MD Initiated Contact with Patient 08/27/11 1206      12:25 PM HPI Patient has returned after being seen here by me on 08/22/11. Reports symptoms of diarrhea, nausea, vomiting, abdominal pain has not resolved. Reports symptoms have worsened. Patient has a significant history of colitis. Points to pain worsening in the left lower quadrant. Reports she's been having leg cramps.  Also states she continues to dizziness and weakness. Denies fever, urinary symptoms, vaginal symptoms, back pain.  Patient is a 46 y.o. female presenting with abdominal pain. The history is provided by the patient.  Abdominal Pain The primary symptoms of the illness include abdominal pain, nausea, vomiting and diarrhea. The primary symptoms of the illness do not include fever, shortness of breath, hematemesis, hematochezia, dysuria, vaginal discharge or vaginal bleeding. The current episode started more than 2 days ago. The onset of the illness was gradual. The problem has been gradually worsening.  The patient states that she believes she is currently not pregnant. Risk factors for an acute abdominal problem include a history of abdominal surgery. Symptoms associated with the illness do not include chills, anorexia, diaphoresis, heartburn, constipation, urgency, hematuria, frequency or back pain. Associated medical issues comments: History of colitis, C. difficile, chronic diarrhea, chronic back pain, drug-seeking behavior, Addison's.    Past Medical History  Diagnosis Date  . Colitis   . WPW (Wolff-Parkinson-White syndrome)   . Kidney calculi   . Kidney stone   . Addison disease   . Thyroid disease   . Clostridium difficile diarrhea   . Anxiety and depression   . Chronic diarrhea   . Chronic back pain   . Drug-seeking behavior   . Irritable bowel syndrome (IBS)   . Anemia   . Colitis   . Addison's disease     Past Surgical History    Procedure Date  . Cholecystectomy   . Tubal ligation   . Lithotripsy   . Sphincterotomy   . Cardiac electrophysiology mapping and ablation     for WPW    Family History  Problem Relation Age of Onset  . Colon cancer Neg Hx     History  Substance Use Topics  . Smoking status: Current Everyday Smoker -- 1.0 packs/day for 30 years    Types: Cigarettes  . Smokeless tobacco: Never Used  . Alcohol Use: No    OB History    Grav Para Term Preterm Abortions TAB SAB Ect Mult Living   3 3              Review of Systems  Constitutional: Negative for fever, chills and diaphoresis.  Respiratory: Negative for shortness of breath.   Gastrointestinal: Positive for nausea, vomiting, abdominal pain and diarrhea. Negative for heartburn, constipation, blood in stool, hematochezia, anorexia and hematemesis.  Genitourinary: Negative for dysuria, urgency, frequency, hematuria, vaginal bleeding and vaginal discharge.  Musculoskeletal: Negative for back pain.  All other systems reviewed and are negative.    Allergies  Ambien; Nsaids; and Zofran  Home Medications   Current Outpatient Rx  Name Route Sig Dispense Refill  . BUDESONIDE ER 3 MG PO CP24 Oral Take 9 mg by mouth every morning.     Marland Kitchen CLONAZEPAM 1 MG PO TABS Oral Take 1 mg by mouth 4 (four) times daily as needed. anxiety    . HYDROCORTISONE 10 MG PO TABS Oral Take 1 tablet (10 mg total) by  mouth 2 (two) times daily. 60 tablet 0  . LEVOTHYROXINE SODIUM 50 MCG PO TABS Oral Take 50 mcg by mouth daily.      Marland Kitchen METOCLOPRAMIDE HCL 10 MG PO TABS Oral Take 1 tablet (10 mg total) by mouth every 6 (six) hours. 30 tablet 0  . ADULT MULTIVITAMIN W/MINERALS CH Oral Take 1 tablet by mouth daily.      Marland Kitchen NORTRIPTYLINE HCL 25 MG PO CAPS Oral Take 25 mg by mouth at bedtime.    Marland Kitchen POTASSIUM CHLORIDE CRYS ER 20 MEQ PO TBCR Oral Take 2 tablets (40 mEq total) by mouth 2 (two) times daily.  0  . RANITIDINE HCL 150 MG PO CAPS Oral Take 1 capsule (150 mg  total) by mouth daily. 30 capsule 0  . SUCRALFATE 1 GM/10ML PO SUSP Oral Take 10 mLs (1 g total) by mouth 4 (four) times daily. 420 mL 1  . TRAMADOL HCL 50 MG PO TABS Oral Take 1 tablet (50 mg total) by mouth every 6 (six) hours as needed for pain. 30 tablet 0    BP 109/80  Pulse 110  Temp(Src) 98.3 F (36.8 C) (Oral)  Resp 18  Ht 5\' 4"  (1.626 m)  Wt 133 lb (60.328 kg)  BMI 22.83 kg/m2  SpO2 96%  LMP 08/12/2011  Physical Exam  Vitals reviewed. Constitutional: She is oriented to person, place, and time. Vital signs are normal. She appears well-developed and well-nourished.  HENT:  Head: Normocephalic and atraumatic.  Eyes: Conjunctivae are normal. Pupils are equal, round, and reactive to light.  Neck: Normal range of motion. Neck supple.  Cardiovascular: Normal rate, regular rhythm and normal heart sounds.  Exam reveals no friction rub.   No murmur heard. Pulmonary/Chest: Effort normal and breath sounds normal. She has no wheezes. She has no rhonchi. She has no rales. She exhibits no tenderness.  Abdominal: Soft. Bowel sounds are normal. She exhibits no distension and no mass. There is no hepatosplenomegaly. There is generalized tenderness. There is no rigidity, no rebound, no guarding, no CVA tenderness, no tenderness at McBurney's point and negative Murphy's sign.  Musculoskeletal: Normal range of motion.  Neurological: She is alert and oriented to person, place, and time. Coordination normal.  Skin: Skin is warm and dry. No rash noted. No erythema. No pallor.    ED Course  Procedures Results for orders placed during the hospital encounter of 08/27/11  CBC      Component Value Range   WBC 9.8  4.0 - 10.5 (K/uL)   RBC 3.50 (*) 3.87 - 5.11 (MIL/uL)   Hemoglobin 12.2  12.0 - 15.0 (g/dL)   HCT 16.1 (*) 09.6 - 46.0 (%)   MCV 100.0  78.0 - 100.0 (fL)   MCH 34.9 (*) 26.0 - 34.0 (pg)   MCHC 34.9  30.0 - 36.0 (g/dL)   RDW 04.5  40.9 - 81.1 (%)   Platelets 401 (*) 150 - 400 (K/uL)   DIFFERENTIAL      Component Value Range   Neutrophils Relative 73  43 - 77 (%)   Neutro Abs 7.2  1.7 - 7.7 (K/uL)   Lymphocytes Relative 12  12 - 46 (%)   Lymphs Abs 1.2  0.7 - 4.0 (K/uL)   Monocytes Relative 8  3 - 12 (%)   Monocytes Absolute 0.8  0.1 - 1.0 (K/uL)   Eosinophils Relative 6 (*) 0 - 5 (%)   Eosinophils Absolute 0.6  0.0 - 0.7 (K/uL)   Basophils Relative 0  0 - 1 (%)   Basophils Absolute 0.0  0.0 - 0.1 (K/uL)  COMPREHENSIVE METABOLIC PANEL      Component Value Range   Sodium 139  135 - 145 (mEq/L)   Potassium 2.8 (*) 3.5 - 5.1 (mEq/L)   Chloride 101  96 - 112 (mEq/L)   CO2 27  19 - 32 (mEq/L)   Glucose, Bld 89  70 - 99 (mg/dL)   BUN 12  6 - 23 (mg/dL)   Creatinine, Ser 9.56  0.50 - 1.10 (mg/dL)   Calcium 9.1  8.4 - 21.3 (mg/dL)   Total Protein 6.9  6.0 - 8.3 (g/dL)   Albumin 3.0 (*) 3.5 - 5.2 (g/dL)   AST 9  0 - 37 (U/L)   ALT 5  0 - 35 (U/L)   Alkaline Phosphatase 104  39 - 117 (U/L)   Total Bilirubin 0.1 (*) 0.3 - 1.2 (mg/dL)   GFR calc non Af Amer >90  >90 (mL/min)   GFR calc Af Amer >90  >90 (mL/min)  LIPASE, BLOOD      Component Value Range   Lipase 62 (*) 11 - 59 (U/L)  URINALYSIS, ROUTINE W REFLEX MICROSCOPIC      Component Value Range   Color, Urine YELLOW  YELLOW    APPearance TURBID (*) CLEAR    Specific Gravity, Urine 1.039 (*) 1.005 - 1.030    pH 6.0  5.0 - 8.0    Glucose, UA NEGATIVE  NEGATIVE (mg/dL)   Hgb urine dipstick NEGATIVE  NEGATIVE    Bilirubin Urine SMALL (*) NEGATIVE    Ketones, ur 15 (*) NEGATIVE (mg/dL)   Protein, ur 30 (*) NEGATIVE (mg/dL)   Urobilinogen, UA 0.2  0.0 - 1.0 (mg/dL)   Nitrite NEGATIVE  NEGATIVE    Leukocytes, UA NEGATIVE  NEGATIVE   URINE MICROSCOPIC-ADD ON      Component Value Range   Squamous Epithelial / LPF MANY (*) RARE    WBC, UA 3-6  <3 (WBC/hpf)   RBC / HPF 0-2  <3 (RBC/hpf)   Bacteria, UA MANY (*) RARE    Urine-Other MUCOUS PRESENT     Ct Chest W Contrast  08/27/2011  *RADIOLOGY REPORT*   Clinical Data: Abnormality of the lung bases on recent CT abdomen.  CT CHEST WITH CONTRAST  Technique:  Multidetector CT imaging of the chest was performed following the standard protocol during bolus administration of intravenous contrast.  Contrast: OMNIPAQUE IOHEXOL 350 MG/ML SOLN  Comparison: CT chest dated 12/26/2010  Findings: Multifocal central patchy/ground-glass opacities throughout all lobes.  Additional paramediastinal opacities in the upper lobes with possible air bronchograms (series 3/image 16) and/or fibrosis.  Additional peripheral patchy/nodular opacities scattered throughout all lobes, measuring up to 11 mm at the right lung base (series 3/image 46).  When compared to the prior CT chest, these findings are new.  No underlying chronic interstitial lung disease.  Differential considerations include atypical infection (including Pneumocystis pneumonia in the setting of immunosuppression/HIV), inflammatory disease (such as hypersensitivity pneumonitis or drug toxicity), or possibly interstitial lung disease related to underlying collagen vascular disease or vasculitis.  The appearance does not suggest bacterial/lobar pneumonia.  Given the associated patchy opacities, metastatic disease and septic emboli are considered unlikely.  Visualized thyroid is unremarkable.  The heart is normal in size.  No pericardial effusion.  Small right paratracheal/mediastinal/right hilar lymph nodes measuring up to 10 mm short axis, possibly reactive. No suspicious axillary lymphadenopathy.  Visualized upper abdomen is unremarkable.  Mild  degenerative changes of the visualized thoracolumbar spine.  IMPRESSION: Multifocal patchy/nodular opacities throughout all lobes, as described above, new from 2012 CT chest.  Primary differential considerations include atypical infection or inflammatory disease, as described above.  Metastatic disease or septic emboli are considered unlikely.  Original Report Authenticated By:  Charline Bills, M.D.   Ct Abdomen Pelvis W Contrast  08/27/2011  *RADIOLOGY REPORT*  Clinical Data: Abdominal pain.  History of colitis.  White count 9.80.  Nausea, vomiting, diarrhea.  Chronic back pain.  CT ABDOMEN AND PELVIS WITH CONTRAST  Technique:  Multidetector CT imaging of the abdomen and pelvis was performed following the standard protocol during bolus administration of intravenous contrast.  Contrast: OMNIPAQUE IOHEXOL 300 MG/ML  SOLN  Comparison: 06/07/2011  Findings: The lung bases are markedly abnormal.  Multiple pulmonary nodules have developed since prior study.  These vary in size from approximately 1 mm to 1.6 cm.  Nodules are circumscribed and irregular.  No pleural effusions are identified.  No focal abnormality identified in the liver, spleen, pancreas, adrenal glands, or kidneys. The appendix is well seen and has a normal appearance.  Gallbladder is absent.  The stomach and small bowel loops have a normal appearance. Colonic loops are normal in appearance. The uterus is present.  No evidence for adnexal mass.  Patient has had bilateral tubal ligation.  No free pelvic fluid or pelvic adenopathy.  IMPRESSION:  1.  Markedly abnormal lung bases with multiple large and small pulmonary masses.  Differential diagnosis includes inflammatory/infectious process, metastases, other new process, septic emboli.  Recommend further evaluation with CT of the chest with contrast to evaluate for adenopathy in further characterization of these nodules. 2.  No evidence for acute appendicitis or colitis. 3.  Status post bilateral tubal ligation.  Original Report Authenticated By: Patterson Hammersmith, M.D.   Dg Abd 2 Views  08/07/2011  *RADIOLOGY REPORT*  Clinical Data: Right lower quadrant abdominal pain.  ABDOMEN - 2 VIEW  Comparison: 06/09/2011  Findings: Upright film shows no evidence for intraperitoneal free air. There is no evidence for gaseous bowel dilation to suggest obstruction.  Gas is seen in  the relatively featureless left colon. Surgical clips in the right upper quadrant suggest prior cholecystectomy.  Surgical ligation clips are seen in the anatomic pelvis.  No unexpected abdominopelvic calcification.  IMPRESSION:   No intraperitoneal free air.  Gas is seen within a relatively featureless descending colon.  This can be seen in cases of colonic wall edema/inflammation.  The CT imaging may prove helpful to further evaluate as clinically warranted.  Original Report Authenticated By: ERIC A. MANSELL, M.D.     MDM   And since patient has returned with worsening abdominal pain and persistent diarrhea. Will re\re check basic labs and order CT scan. Patient treated with Dilaudid and Reglan.  6:07 PM Patient's CT scan is abnormal and shows changes in bilateral lungs. Radiologist recommended further examination with CT chest. Differential does not exclude metastatic cancer, versus infectious process. Discussed this with patient. Patient's becoming anxious and requests something for anxiety. Advised patient we will likely obtain CT scan have patient admitted.  8:39 PM Spoke with Dr. Lovell Sheehan, triad hospitalist. She sips patient for admission to [facility] long since patient absolutely refused to [facility]. Requests patient be put in a telemetry bed, team 2.  Dx: Atypical pulmonary infection, wound, diarrhea, abdominal pain. Discussed patient agrees with plan  Thomasene Lot, PA-C 08/27/11 2040

## 2011-08-27 NOTE — Progress Notes (Signed)
Pt request sleep med. Asked what pt takes at home, pt states she does not have trouble sleeping @ home, just here in hospital.  MD text page request. Orders received. Pt notified of new orders. Pt request all meds to be given simultaneously when all can be correlated to do so.  Pt states this will be only way she can sleep. Currently denies nausea, requesting diet order. MD to be text paged again for request. Pt lying on daybed with window open. States she feels she cant breathe. Refuses O2 supplement. Encouraged to keep head elevated and considering a change of position from lying flat on back w/o pillow to an upright sitting position or back to bed would alleviate difficulty breathing.

## 2011-08-27 NOTE — ED Notes (Signed)
Attempted to give report to RN at [facility] unable to take awaiting return call

## 2011-08-27 NOTE — ED Notes (Signed)
Attempted x 2 to start IV- unable to obtain- report to B. Senaida Ores, RN

## 2011-08-27 NOTE — ED Notes (Signed)
Pt unable to tolerate K+ rate at 126ml/hr; decreased to 65ml/hr

## 2011-08-27 NOTE — ED Notes (Signed)
Pt states she was seen here on Monday, continues to have diarrhea and leg cramps.  Pt states she feels nauseated.

## 2011-08-28 ENCOUNTER — Encounter (HOSPITAL_COMMUNITY): Payer: Self-pay | Admitting: Internal Medicine

## 2011-08-28 DIAGNOSIS — K5289 Other specified noninfective gastroenteritis and colitis: Secondary | ICD-10-CM

## 2011-08-28 DIAGNOSIS — R079 Chest pain, unspecified: Secondary | ICD-10-CM

## 2011-08-28 DIAGNOSIS — J189 Pneumonia, unspecified organism: Secondary | ICD-10-CM | POA: Diagnosis present

## 2011-08-28 DIAGNOSIS — K52831 Collagenous colitis: Secondary | ICD-10-CM | POA: Diagnosis present

## 2011-08-28 DIAGNOSIS — E2749 Other adrenocortical insufficiency: Secondary | ICD-10-CM

## 2011-08-28 DIAGNOSIS — E876 Hypokalemia: Secondary | ICD-10-CM

## 2011-08-28 DIAGNOSIS — K529 Noninfective gastroenteritis and colitis, unspecified: Secondary | ICD-10-CM | POA: Diagnosis present

## 2011-08-28 DIAGNOSIS — R197 Diarrhea, unspecified: Secondary | ICD-10-CM

## 2011-08-28 LAB — BASIC METABOLIC PANEL
BUN: 10 mg/dL (ref 6–23)
Calcium: 8 mg/dL — ABNORMAL LOW (ref 8.4–10.5)
Creatinine, Ser: 0.57 mg/dL (ref 0.50–1.10)
GFR calc Af Amer: >90 mL/min (ref >90)
GFR calc Af Amer: >90 mL/min (ref >90)
GFR calc non Af Amer: >90 mL/min (ref >90)
GFR calc non Af Amer: >90 mL/min (ref >90)
Glucose, Bld: 87 mg/dL (ref 70–99)
Glucose, Bld: 118 mg/dL — ABNORMAL HIGH (ref 70–99)
Potassium: 2.6 mEq/L — CL (ref 3.5–5.1)
Potassium: 3.4 mEq/L — ABNORMAL LOW (ref 3.5–5.1)
Sodium: 133 mEq/L — ABNORMAL LOW (ref 135–145)

## 2011-08-28 LAB — CARDIAC PANEL(CRET KIN+CKTOT+MB+TROPI)
CK, MB: 1.7 ng/mL (ref 0.3–4.0)
Relative Index: INVALID (ref 0.0–2.5)
Total CK: 23 U/L (ref 7–177)
Troponin I: <0.3 ng/mL (ref <0.30)

## 2011-08-28 LAB — MAGNESIUM: Magnesium: 1.7 mg/dL (ref 1.5–2.5)

## 2011-08-28 LAB — CBC
Hemoglobin: 10.9 g/dL — ABNORMAL LOW (ref 12.0–15.0)
MCH: 34.3 pg — ABNORMAL HIGH (ref 26.0–34.0)
Platelets: 369 10*3/uL (ref 150–400)
RBC: 3.18 MIL/uL — ABNORMAL LOW (ref 3.87–5.11)
WBC: 9.7 10*3/uL (ref 4.0–10.5)

## 2011-08-28 LAB — PROCALCITONIN: Procalcitonin: <0.1 ng/mL

## 2011-08-28 MED ORDER — SODIUM CHLORIDE 0.9 % IV BOLUS (SEPSIS)
500.0000 mL | Freq: Once | INTRAVENOUS | Status: AC
  Administered 2011-08-28: 500 mL via INTRAVENOUS

## 2011-08-28 MED ORDER — NICOTINE 14 MG/24HR TD PT24
14.0000 mg | MEDICATED_PATCH | Freq: Every day | TRANSDERMAL | Status: DC
  Administered 2011-08-28 – 2011-09-02 (×6): 14 mg via TRANSDERMAL
  Filled 2011-08-28 (×6): qty 1

## 2011-08-28 MED ORDER — ACETAMINOPHEN 325 MG PO TABS
650.0000 mg | ORAL_TABLET | Freq: Four times a day (QID) | ORAL | Status: DC | PRN
  Administered 2011-08-28 – 2011-08-29 (×2): 650 mg via ORAL
  Filled 2011-08-28 (×2): qty 2

## 2011-08-28 MED ORDER — OXYCODONE HCL 5 MG PO TABS
5.0000 mg | ORAL_TABLET | ORAL | Status: DC | PRN
  Administered 2011-08-29 – 2011-09-01 (×9): 5 mg via ORAL
  Filled 2011-08-28 (×11): qty 1

## 2011-08-28 MED ORDER — VANCOMYCIN HCL 1000 MG IV SOLR
750.0000 mg | Freq: Two times a day (BID) | INTRAVENOUS | Status: DC
  Administered 2011-08-28 – 2011-08-30 (×6): 750 mg via INTRAVENOUS
  Filled 2011-08-28 (×7): qty 750

## 2011-08-28 MED ORDER — SACCHAROMYCES BOULARDII 250 MG PO CAPS
250.0000 mg | ORAL_CAPSULE | Freq: Two times a day (BID) | ORAL | Status: DC
  Administered 2011-08-28 – 2011-09-02 (×10): 250 mg via ORAL
  Filled 2011-08-28 (×13): qty 1

## 2011-08-28 MED ORDER — ACETAMINOPHEN 650 MG RE SUPP: 650.0000 mg | Freq: Four times a day (QID) | RECTAL | Status: DC | PRN

## 2011-08-28 MED ORDER — TRAZODONE HCL 100 MG PO TABS
100.0000 mg | ORAL_TABLET | Freq: Every evening | ORAL | Status: DC | PRN
  Administered 2011-08-28 – 2011-09-01 (×6): 100 mg via ORAL
  Filled 2011-08-28 (×6): qty 1

## 2011-08-28 MED ORDER — POTASSIUM CHLORIDE 10 MEQ/100ML IV SOLN
10.0000 meq | INTRAVENOUS | Status: AC
  Administered 2011-08-28 (×4): 10 meq via INTRAVENOUS
  Filled 2011-08-28 (×4): qty 100

## 2011-08-28 MED ORDER — PROMETHAZINE HCL 25 MG PO TABS
12.5000 mg | ORAL_TABLET | Freq: Four times a day (QID) | ORAL | Status: DC | PRN
  Administered 2011-08-28 – 2011-08-29 (×4): 12.5 mg via ORAL
  Filled 2011-08-28 (×4): qty 1

## 2011-08-28 MED ORDER — ENOXAPARIN SODIUM 40 MG/0.4ML ~~LOC~~ SOLN
40.0000 mg | SUBCUTANEOUS | Status: DC
  Administered 2011-08-28 – 2011-09-02 (×6): 40 mg via SUBCUTANEOUS
  Filled 2011-08-28 (×6): qty 0.4

## 2011-08-28 MED ORDER — POTASSIUM CHLORIDE IN NACL 20-0.9 MEQ/L-% IV SOLN
INTRAVENOUS | Status: DC
  Administered 2011-08-28 – 2011-08-30 (×6): via INTRAVENOUS
  Filled 2011-08-28 (×8): qty 1000

## 2011-08-28 MED ORDER — HYDROCORTISONE SOD SUCCINATE 100 MG IJ SOLR
100.0000 mg | Freq: Three times a day (TID) | INTRAMUSCULAR | Status: DC
  Administered 2011-08-28 (×2): 100 mg via INTRAVENOUS
  Filled 2011-08-28 (×5): qty 2

## 2011-08-28 MED ORDER — ALUM & MAG HYDROXIDE-SIMETH 200-200-20 MG/5ML PO SUSP: 30.0000 mL | Freq: Four times a day (QID) | ORAL | Status: DC | PRN

## 2011-08-28 MED ORDER — HYDROMORPHONE HCL PF 1 MG/ML IJ SOLN
0.5000 mg | INTRAMUSCULAR | Status: DC | PRN
  Administered 2011-08-28 – 2011-08-29 (×3): 0.5 mg via INTRAVENOUS
  Filled 2011-08-28 (×3): qty 1

## 2011-08-28 MED ORDER — LORAZEPAM 2 MG/ML IJ SOLN
0.5000 mg | Freq: Four times a day (QID) | INTRAMUSCULAR | Status: DC | PRN
  Administered 2011-08-28 – 2011-08-29 (×3): 0.5 mg via INTRAVENOUS
  Filled 2011-08-28 (×3): qty 1

## 2011-08-28 MED ORDER — CEFEPIME HCL 1 G IJ SOLR
1.0000 g | Freq: Three times a day (TID) | INTRAMUSCULAR | Status: DC
  Administered 2011-08-28 – 2011-09-02 (×17): 1 g via INTRAVENOUS
  Filled 2011-08-28 (×19): qty 1

## 2011-08-28 MED ORDER — MORPHINE SULFATE 2 MG/ML IJ SOLN
2.0000 mg | INTRAMUSCULAR | Status: DC | PRN
  Administered 2011-08-28 (×4): 2 mg via INTRAVENOUS
  Filled 2011-08-28 (×4): qty 1

## 2011-08-28 MED ORDER — ALBUTEROL SULFATE (5 MG/ML) 0.5% IN NEBU: 2.5000 mg | INHALATION_SOLUTION | Freq: Four times a day (QID) | RESPIRATORY_TRACT | Status: DC | PRN

## 2011-08-28 MED ORDER — METHYLPREDNISOLONE SODIUM SUCC 40 MG IJ SOLR
40.0000 mg | Freq: Four times a day (QID) | INTRAMUSCULAR | Status: DC
  Administered 2011-08-28 – 2011-09-01 (×15): 40 mg via INTRAVENOUS
  Filled 2011-08-28 (×23): qty 1

## 2011-08-28 NOTE — H&P (Signed)
DATE OF ADMISSION:  08/28/2011  PCP:    Juan Quam, MD, MD   Chief Complaint:  Weakness, ABD Pain     HPI: Dawn Frost is an 46 y.o. female with multiple medical problems who went to the [facility] ED due to complaints of worsening fatigue and weakness, fevers and chills over the past 10 days.  She reports having chronic diarrhea  Due to her collagenous colitis and felt that her potassium level was probably low.  She had been seen in the Ed a few days earlier and her potassium level at that time was 3.1 and she increased her supplementation  But she continued to feel bad.  When she returned to the Ed her lab studies revealed a potassium level of 2.8.  Her workup also included a CT scan of her ABD due to her complaints of lower ABD Pain and chronic diarrhea and the scan revealed an incidental finding of infiltrates in the lung bases which prompted a  CT scan of the chest to be performed which revealed findings consistent with a Multifocal pneumonia.  She was hen place don IV antibiotic therapy of Vancomycin and Zosyn and referred for transfer and admission.     On evaluation and interview of the patient she had complaints of chest heaviness, and anxiety.    Past Medical History  Diagnosis Date  . Collagenous colitis   . WPW (Wolff-Parkinson-White syndrome)   . Kidney stone   . Addison disease   . Thyroid disease   . Clostridium difficile diarrhea   . Anxiety and depression   . Chronic diarrhea   . Chronic back pain   . Drug-seeking behavior   . Irritable bowel syndrome (IBS)   . Anemia     Past Surgical History  Procedure Date  . Cholecystectomy   . Tubal ligation   . Lithotripsy   . Sphincterotomy   . Cardiac electrophysiology mapping and ablation     for WPW    Medications:  HOME MEDS: Prior to Admission medications   Medication Sig Start Date End Date Taking? Authorizing Provider  budesonide (ENTOCORT EC) 3 MG 24 hr capsule Take 9 mg by mouth every  morning.    Yes Historical Provider, MD  clonazePAM (KLONOPIN) 1 MG tablet Take 1 mg by mouth 4 (four) times daily as needed. anxiety   Yes Historical Provider, MD  hydrocortisone (CORTEF) 10 MG tablet Take 1 tablet (10 mg total) by mouth 2 (two) times daily. 08/09/11  Yes Clydia Llano, MD  levothyroxine (SYNTHROID, LEVOTHROID) 50 MCG tablet Take 50 mcg by mouth daily.     Yes Historical Provider, MD  metoCLOPramide (REGLAN) 10 MG tablet Take 1 tablet (10 mg total) by mouth every 6 (six) hours. 08/22/11 09/01/11 Yes Brigitte Cyndie Chime, PA-C  Multiple Vitamin (MULITIVITAMIN WITH MINERALS) TABS Take 1 tablet by mouth daily.     Yes Historical Provider, MD  nortriptyline (PAMELOR) 25 MG capsule Take 25 mg by mouth at bedtime.   Yes Historical Provider, MD  potassium chloride SA (K-DUR,KLOR-CON) 20 MEQ tablet Take 2 tablets (40 mEq total) by mouth 2 (two) times daily. 08/09/11 08/08/12 Yes Clydia Llano, MD  ranitidine (ZANTAC) 150 MG capsule Take 1 capsule (150 mg total) by mouth daily. 07/24/11 07/23/12 Yes Ethelda Chick, MD  sucralfate (CARAFATE) 1 GM/10ML suspension Take 10 mLs (1 g total) by mouth 4 (four) times daily. 08/09/11 09/08/11 Yes Clydia Llano, MD  traMADol (ULTRAM) 50 MG tablet Take 1 tablet (50 mg  total) by mouth every 6 (six) hours as needed for pain. 08/22/11 09/01/11 Yes Thomasene Lot, PA-C    Allergies:  Allergies  Allergen Reactions  . Ambien (Zolpidem Tartrate) Other (See Comments)    headache  . Nsaids Other (See Comments)    Bad for colitis  . Zofran Other (See Comments)    Headache     Social History:   reports that she has been smoking Cigarettes.  She has a 30 pack-year smoking history. She has never used smokeless tobacco. She reports that she does not drink alcohol or use illicit drugs.  Family History: Family History  Problem Relation Age of Onset  . Prostate cancer Father   . Coronary artery disease Maternal Grandfather     Review of Systems:  fever,  chest pain, muscle  weakness, abdominal pain, depression The patient denies anorexia,weight loss, vision loss, decreased hearing, hoarseness, syncope, dyspnea on exertion, peripheral edema, balance deficits, hemoptysis, melena, hematochezia, severe indigestion/heartburn, hematuria, incontinence, genital sores, suspicious skin lesions, transient blindness, difficulty walking, unusual weight change, abnormal bleeding, enlarged lymph nodes, angioedema, and breast masses.   Physical Exam:  GE:  Anxious Thin 46 year old Caucasian female examined  and in no acute distress; cooperative with exam Filed Vitals:   08/27/11 1151 08/27/11 1714 08/27/11 2109 08/27/11 2211  BP: 109/80 105/86 100/71 107/77  Pulse: 110 89 101 97  Temp: 98.3 F (36.8 C)   97.9 F (36.6 C)  TempSrc: Oral   Oral  Resp: 18 16  16   Height: 5\' 4"  (1.626 m)   5\' 4"  (1.626 m)  Weight: 60.328 kg (133 lb)   59.24 kg (130 lb 9.6 oz)  SpO2: 96% 96% 93% 93%   Blood pressure 107/77, pulse 97, temperature 97.9 F (36.6 C), temperature source Oral, resp. rate 16, height 5\' 4"  (1.626 m), weight 59.24 kg (130 lb 9.6 oz), last menstrual period 08/12/2011, SpO2 93.00%. PSYCH: She is alert and oriented x4; Appears Anxious and Depressed; HEENT: Normocephalic and Atraumatic, Mucous membranes pink; PERRLA; EOM intact; Fundi:  Benign;  No scleral icterus, Nares: Patent, Oropharynx: Clear, Fair Dentition, Neck:  FROM, no cervical lymphadenopathy nor thyromegaly or carotid bruit; no JVD; Breasts:: Not examined CHEST WALL: No tenderness CHEST: Normal respiration, clear to auscultation bilaterally HEART: Regular rate and rhythm; no murmurs rubs or gallops BACK: No kyphosis or scoliosis; no CVA tenderness ABDOMEN: Positive Bowel Sounds,  soft non-tender; no masses, no organomegaly.   Rectal Exam: Not done EXTREMITIES: No bone or joint deformity; age-appropriate arthropathy of the hands and knees; no cyanosis, clubbing or edema; no ulcerations. Genitalia: not  examined PULSES: 2+ and symmetric SKIN: Normal hydration no rash or ulceration CNS: Cranial nerves 2-12 grossly intact no focal neurologic deficit   Labs & Imaging Results for orders placed during the hospital encounter of 08/27/11 (from the past 48 hour(s))  URINALYSIS, ROUTINE W REFLEX MICROSCOPIC     Status: Abnormal   Collection Time   08/27/11 12:24 PM      Component Value Range Comment   Color, Urine YELLOW  YELLOW     APPearance TURBID (*) CLEAR     Specific Gravity, Urine 1.039 (*) 1.005 - 1.030     pH 6.0  5.0 - 8.0     Glucose, UA NEGATIVE  NEGATIVE (mg/dL)    Hgb urine dipstick NEGATIVE  NEGATIVE     Bilirubin Urine SMALL (*) NEGATIVE     Ketones, ur 15 (*) NEGATIVE (mg/dL)    Protein, ur  30 (*) NEGATIVE (mg/dL)    Urobilinogen, UA 0.2  0.0 - 1.0 (mg/dL)    Nitrite NEGATIVE  NEGATIVE     Leukocytes, UA NEGATIVE  NEGATIVE    URINE MICROSCOPIC-ADD ON     Status: Abnormal   Collection Time   08/27/11 12:24 PM      Component Value Range Comment   Squamous Epithelial / LPF MANY (*) RARE     WBC, UA 3-6  <3 (WBC/hpf)    RBC / HPF 0-2  <3 (RBC/hpf)    Bacteria, UA MANY (*) RARE     Urine-Other MUCOUS PRESENT     CBC     Status: Abnormal   Collection Time   08/27/11 12:35 PM      Component Value Range Comment   WBC 9.8  4.0 - 10.5 (K/uL)    RBC 3.50 (*) 3.87 - 5.11 (MIL/uL)    Hemoglobin 12.2  12.0 - 15.0 (g/dL)    HCT 40.9 (*) 81.1 - 46.0 (%)    MCV 100.0  78.0 - 100.0 (fL)    MCH 34.9 (*) 26.0 - 34.0 (pg)    MCHC 34.9  30.0 - 36.0 (g/dL)    RDW 91.4  78.2 - 95.6 (%)    Platelets 401 (*) 150 - 400 (K/uL)   DIFFERENTIAL     Status: Abnormal   Collection Time   08/27/11 12:35 PM      Component Value Range Comment   Neutrophils Relative 73  43 - 77 (%)    Neutro Abs 7.2  1.7 - 7.7 (K/uL)    Lymphocytes Relative 12  12 - 46 (%)    Lymphs Abs 1.2  0.7 - 4.0 (K/uL)    Monocytes Relative 8  3 - 12 (%)    Monocytes Absolute 0.8  0.1 - 1.0 (K/uL)    Eosinophils Relative  6 (*) 0 - 5 (%)    Eosinophils Absolute 0.6  0.0 - 0.7 (K/uL)    Basophils Relative 0  0 - 1 (%)    Basophils Absolute 0.0  0.0 - 0.1 (K/uL)   COMPREHENSIVE METABOLIC PANEL     Status: Abnormal   Collection Time   08/27/11 12:35 PM      Component Value Range Comment   Sodium 139  135 - 145 (mEq/L)    Potassium 2.8 (*) 3.5 - 5.1 (mEq/L)    Chloride 101  96 - 112 (mEq/L)    CO2 27  19 - 32 (mEq/L)    Glucose, Bld 89  70 - 99 (mg/dL)    BUN 12  6 - 23 (mg/dL)    Creatinine, Ser 2.13  0.50 - 1.10 (mg/dL)    Calcium 9.1  8.4 - 10.5 (mg/dL)    Total Protein 6.9  6.0 - 8.3 (g/dL)    Albumin 3.0 (*) 3.5 - 5.2 (g/dL)    AST 9  0 - 37 (U/L)    ALT 5  0 - 35 (U/L)    Alkaline Phosphatase 104  39 - 117 (U/L)    Total Bilirubin 0.1 (*) 0.3 - 1.2 (mg/dL)    GFR calc non Af Amer >90  >90 (mL/min)    GFR calc Af Amer >90  >90 (mL/min)   LIPASE, BLOOD     Status: Abnormal   Collection Time   08/27/11 12:35 PM      Component Value Range Comment   Lipase 62 (*) 11 - 59 (U/L)    Ct Chest W Contrast  08/27/2011  *RADIOLOGY REPORT*  Clinical Data: Abnormality of the lung bases on recent CT abdomen.  CT CHEST WITH CONTRAST  Technique:  Multidetector CT imaging of the chest was performed following the standard protocol during bolus administration of intravenous contrast.  Contrast: OMNIPAQUE IOHEXOL 350 MG/ML SOLN  Comparison: CT chest dated 12/26/2010  Findings: Multifocal central patchy/ground-glass opacities throughout all lobes.  Additional paramediastinal opacities in the upper lobes with possible air bronchograms (series 3/image 16) and/or fibrosis.  Additional peripheral patchy/nodular opacities scattered throughout all lobes, measuring up to 11 mm at the right lung base (series 3/image 46).  When compared to the prior CT chest, these findings are new.  No underlying chronic interstitial lung disease.  Differential considerations include atypical infection (including Pneumocystis pneumonia in the  setting of immunosuppression/HIV), inflammatory disease (such as hypersensitivity pneumonitis or drug toxicity), or possibly interstitial lung disease related to underlying collagen vascular disease or vasculitis.  The appearance does not suggest bacterial/lobar pneumonia.  Given the associated patchy opacities, metastatic disease and septic emboli are considered unlikely.  Visualized thyroid is unremarkable.  The heart is normal in size.  No pericardial effusion.  Small right paratracheal/mediastinal/right hilar lymph nodes measuring up to 10 mm short axis, possibly reactive. No suspicious axillary lymphadenopathy.  Visualized upper abdomen is unremarkable.  Mild degenerative changes of the visualized thoracolumbar spine.  IMPRESSION: Multifocal patchy/nodular opacities throughout all lobes, as described above, new from 2012 CT chest.  Primary differential considerations include atypical infection or inflammatory disease, as described above.  Metastatic disease or septic emboli are considered unlikely.  Original Report Authenticated By: Charline Bills, M.D.   Ct Abdomen Pelvis W Contrast  08/27/2011  *RADIOLOGY REPORT*  Clinical Data: Abdominal pain.  History of colitis.  White count 9.80.  Nausea, vomiting, diarrhea.  Chronic back pain.  CT ABDOMEN AND PELVIS WITH CONTRAST  Technique:  Multidetector CT imaging of the abdomen and pelvis was performed following the standard protocol during bolus administration of intravenous contrast.  Contrast: OMNIPAQUE IOHEXOL 300 MG/ML  SOLN  Comparison: 06/07/2011  Findings: The lung bases are markedly abnormal.  Multiple pulmonary nodules have developed since prior study.  These vary in size from approximately 1 mm to 1.6 cm.  Nodules are circumscribed and irregular.  No pleural effusions are identified.  No focal abnormality identified in the liver, spleen, pancreas, adrenal glands, or kidneys. The appendix is well seen and has a normal appearance.  Gallbladder is  absent.  The stomach and small bowel loops have a normal appearance. Colonic loops are normal in appearance. The uterus is present.  No evidence for adnexal mass.  Patient has had bilateral tubal ligation.  No free pelvic fluid or pelvic adenopathy.  IMPRESSION:  1.  Markedly abnormal lung bases with multiple large and small pulmonary masses.  Differential diagnosis includes inflammatory/infectious process, metastases, other new process, septic emboli.  Recommend further evaluation with CT of the chest with contrast to evaluate for adenopathy in further characterization of these nodules. 2.  No evidence for acute appendicitis or colitis. 3.  Status post bilateral tubal ligation.  Original Report Authenticated By: Patterson Hammersmith, M.D.      Assessment: Present on Admission:  .Atypical pneumonia .Hypokalemia .Chronic diarrhea .Collagenous colitis .Chest pain .Adrenal insufficiency ABD Pain  Plan:    Admit to Telemetry Bed.   IV Vancomycin, Cefepime, Albuterol Nebs PRN, O2 PRN. Replete K+, Check Magnesium.  Cardiac Enzymes X 1 Stress Dose Steroid therapy Pain Control Reconcile Home Medications  DVT Prophylaxis Other plans as per orders.    CODE STATUS:      FULL CODE         Eutimio Gharibian C 08/28/2011, 12:23 AM

## 2011-08-28 NOTE — Progress Notes (Signed)
ANTIBIOTIC CONSULT NOTE - INITIAL  Pharmacy Consult for Vancomycin & Cefepime Indication: pneumonia  Allergies  Allergen Reactions  . Ambien (Zolpidem Tartrate) Other (See Comments)    headache  . Nsaids Other (See Comments)    Bad for colitis  . Zofran Other (See Comments)    Headache     Patient Measurements: Height: 5\' 4"  (162.6 cm) Weight: 130 lb 9.6 oz (59.24 kg) IBW/kg (Calculated) : 54.7    Vital Signs: Temp: 97.9 F (36.6 C) (05/25 2211) Temp src: Oral (05/25 2211) BP: 107/77 mmHg (05/25 2211) Pulse Rate: 97  (05/25 2211) Intake/Output from previous day:   Intake/Output from this shift:    Labs:  Basename 08/27/11 1235  WBC 9.8  HGB 12.2  PLT 401*  LABCREA --  CREATININE 0.60   Estimated Creatinine Clearance: 76.7 ml/min (by C-G formula based on Cr of 0.6). No results found for this basename: VANCOTROUGH:2,VANCOPEAK:2,VANCORANDOM:2,GENTTROUGH:2,GENTPEAK:2,GENTRANDOM:2,TOBRATROUGH:2,TOBRAPEAK:2,TOBRARND:2,AMIKACINPEAK:2,AMIKACINTROU:2,AMIKACIN:2, in the last 72 hours   Microbiology: Recent Results (from the past 720 hour(s))  CLOSTRIDIUM DIFFICILE BY PCR     Status: Normal   Collection Time   08/07/11  5:22 PM      Component Value Range Status Comment   C difficile by pcr NEGATIVE  NEGATIVE  Final   STOOL CULTURE     Status: Normal   Collection Time   08/07/11  5:23 PM      Component Value Range Status Comment   Specimen Description STOOL   Final    Special Requests NONE   Final    Culture     Final    Value: NO SALMONELLA, SHIGELLA, CAMPYLOBACTER, YERSINIA, OR E.COLI 0157:H7 ISOLATED   Report Status 08/11/2011 FINAL   Final   CLOSTRIDIUM DIFFICILE BY PCR     Status: Normal   Collection Time   08/22/11  3:03 PM      Component Value Range Status Comment   C difficile by pcr NEGATIVE  NEGATIVE  Final   STOOL CULTURE     Status: Normal   Collection Time   08/22/11  3:45 PM      Component Value Range Status Comment   Specimen Description STOOL    Final    Special Requests NONE   Final    Culture     Final    Value: NO SALMONELLA, SHIGELLA, CAMPYLOBACTER, YERSINIA, OR E.COLI 0157:H7 ISOLATED   Report Status 08/26/2011 FINAL   Final   OVA AND PARASITE EXAMINATION     Status: Normal   Collection Time   08/22/11  3:45 PM      Component Value Range Status Comment   Specimen Description STOOL   Final    Special Requests NONE   Final    Ova and parasites NO OVA OR PARASITES SEEN   Final    Report Status 08/23/2011 FINAL   Final     Medical History: Past Medical History  Diagnosis Date  . Collagenous colitis   . WPW (Wolff-Parkinson-White syndrome)   . Kidney stone   . Addison disease   . Thyroid disease   . Clostridium difficile diarrhea   . Anxiety and depression   . Chronic diarrhea   . Chronic back pain   . Drug-seeking behavior   . Irritable bowel syndrome (IBS)   . Anemia     Medications:  Scheduled:    . sodium chloride   Intravenous STAT  . enoxaparin  40 mg Subcutaneous Q24H  .  HYDROmorphone (DILAUDID) injection  1 mg Intravenous Once  .  HYDROmorphone (DILAUDID) injection  1 mg Intravenous Once  .  HYDROmorphone (DILAUDID) injection  1 mg Intravenous Once  .  HYDROmorphone (DILAUDID) injection  1 mg Intravenous Once  . iohexol  20 mL Oral Q1 Hr x 2  . LORazepam  1 mg Oral Once  . metoCLOPramide (REGLAN) injection  10 mg Intravenous Once  . nicotine  14 mg Transdermal Daily  . piperacillin-tazobactam (ZOSYN)  IV  3.375 g Intravenous Once  . potassium chloride  10 mEq Intravenous Once  . potassium chloride  10 mEq Intravenous Q1 Hr x 4  . promethazine  12.5 mg Intravenous Once  . promethazine  12.5 mg Intravenous Once  . vancomycin  1,000 mg Intravenous Once  . DISCONTD: promethazine  12.5 mg Intravenous Once   Infusions:    . 0.9 % NaCl with KCl 20 mEq / L 100 mL/hr at 08/28/11 0030   Assessment:  46 year female presents with abdominal pain and diarrhea  CT suggests atypical pulmonary  infection  Patient with recent hospitalization (08/07/11) for colitis  Received Vancomycin 1gm IV x 1 in ED @ 20:25  IV Vancomycin & Cefepime to continue for suspected pneumonia  Goal of Therapy:  Vancomycin trough level 15-20 mcg/ml  Plan:   Vancomycin 750 mg IV q12h  Cefepime 1gm IV q8h  Follow renal function, cultures & sensitivities  Check vancomycin trough level as needed  Dawn Frost 08/28/2011,12:31 AM

## 2011-08-28 NOTE — Progress Notes (Signed)
Reason for Consult: Pulmonary consult - nodular infiltrates Referring Physician: Triad Dr Dawn Frost is an 46 y.o. female.  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 Long hx of collagenous colitis.She presented through the [facility] with 10 day hx of sweats, chills- mainly at night, sore throat, exertional dyspnea. No recent antibiotics. Her colitis had been more active recently. She had travelled late April to [place], [place] for a 3 day horse show attended by international crowd, but she denies recent contact with anyone sick and had no personal contact on that trip. Notes a little dry cough with tussive substernal soreness. She slumped in chair at work 1 week ago, transiently and may have been syncopal for few seconds, but walked away from it. CXR 07/24/11- clear and normal. Initial CT was directed at abdomen. Because of past experience, she suspected her colitis had caused hypokalemia to cause malaise. This showed infiltrates, leading to Chest CT with dramatic diffuse soft nodular infiltrates. Has now started Vanc, Cefipime, Zosyn. She is not worse since admission.  Past Medical History  Diagnosis Date  . Collagenous colitis   . WPW (Wolff-Parkinson-White syndrome)   . Kidney stone   . Addison disease   . Thyroid disease   . Clostridium difficile diarrhea   . Anxiety and depression   . Chronic diarrhea   . Chronic back pain   . Drug-seeking behavior   . Irritable bowel syndrome (IBS)   . Anemia     Past Surgical History  Procedure Date  . Cholecystectomy   . Tubal ligation   . Lithotripsy   . Sphincterotomy   . Cardiac electrophysiology mapping and ablation     for WPW    Family History  Problem Relation Age of Onset  . Prostate cancer Father   . Coronary artery disease Maternal Grandfather     Social History:  reports that she has been smoking  Cigarettes.  She has a 30 pack-year smoking history. She has never used smokeless tobacco. She reports that she does not drink alcohol or use illicit drugs.  Allergies:  Allergies  Allergen Reactions  . Ambien (Zolpidem Tartrate) Other (See Comments)    headache  . Nsaids Other (See Comments)    Bad for colitis  . Zofran Other (See Comments)    Headache     Medications:Reviewed  Results for orders placed during the hospital encounter of 08/27/11 (from the past 48 hour(s))  URINALYSIS, ROUTINE W REFLEX MICROSCOPIC     Status: Abnormal   Collection Time   08/27/11 12:24 PM      Component Value Range Comment   Color, Urine YELLOW  YELLOW     APPearance TURBID (*) CLEAR     Specific Gravity, Urine 1.039 (*) 1.005 - 1.030     pH 6.0  5.0 - 8.0     Glucose, UA NEGATIVE  NEGATIVE (mg/dL)    Hgb urine dipstick NEGATIVE  NEGATIVE     Bilirubin Urine SMALL (*) NEGATIVE     Ketones, ur 15 (*) NEGATIVE (mg/dL)    Protein, ur 30 (*) NEGATIVE (mg/dL)    Urobilinogen, UA 0.2  0.0 - 1.0 (mg/dL)    Nitrite NEGATIVE  NEGATIVE     Leukocytes, UA NEGATIVE  NEGATIVE    URINE MICROSCOPIC-ADD ON     Status: Abnormal   Collection Time   08/27/11 12:24 PM  Component Value Range Comment   Squamous Epithelial / LPF MANY (*) RARE     WBC, UA 3-6  <3 (WBC/hpf)    RBC / HPF 0-2  <3 (RBC/hpf)    Bacteria, UA MANY (*) RARE     Urine-Other MUCOUS PRESENT     CBC     Status: Abnormal   Collection Time   08/27/11 12:35 PM      Component Value Range Comment   WBC 9.8  4.0 - 10.5 (K/uL)    RBC 3.50 (*) 3.87 - 5.11 (MIL/uL)    Hemoglobin 12.2  12.0 - 15.0 (g/dL)    HCT 09.8 (*) 11.9 - 46.0 (%)    MCV 100.0  78.0 - 100.0 (fL)    MCH 34.9 (*) 26.0 - 34.0 (pg)    MCHC 34.9  30.0 - 36.0 (g/dL)    RDW 14.7  82.9 - 56.2 (%)    Platelets 401 (*) 150 - 400 (K/uL)   DIFFERENTIAL     Status: Abnormal   Collection Time   08/27/11 12:35 PM      Component Value Range Comment   Neutrophils Relative 73  43 -  77 (%)    Neutro Abs 7.2  1.7 - 7.7 (K/uL)    Lymphocytes Relative 12  12 - 46 (%)    Lymphs Abs 1.2  0.7 - 4.0 (K/uL)    Monocytes Relative 8  3 - 12 (%)    Monocytes Absolute 0.8  0.1 - 1.0 (K/uL)    Eosinophils Relative 6 (*) 0 - 5 (%)    Eosinophils Absolute 0.6  0.0 - 0.7 (K/uL)    Basophils Relative 0  0 - 1 (%)    Basophils Absolute 0.0  0.0 - 0.1 (K/uL)   COMPREHENSIVE METABOLIC PANEL     Status: Abnormal   Collection Time   08/27/11 12:35 PM      Component Value Range Comment   Sodium 139  135 - 145 (mEq/L)    Potassium 2.8 (*) 3.5 - 5.1 (mEq/L)    Chloride 101  96 - 112 (mEq/L)    CO2 27  19 - 32 (mEq/L)    Glucose, Bld 89  70 - 99 (mg/dL)    BUN 12  6 - 23 (mg/dL)    Creatinine, Ser 1.30  0.50 - 1.10 (mg/dL)    Calcium 9.1  8.4 - 10.5 (mg/dL)    Total Protein 6.9  6.0 - 8.3 (g/dL)    Albumin 3.0 (*) 3.5 - 5.2 (g/dL)    AST 9  0 - 37 (U/L)    ALT 5  0 - 35 (U/L)    Alkaline Phosphatase 104  39 - 117 (U/L)    Total Bilirubin 0.1 (*) 0.3 - 1.2 (mg/dL)    GFR calc non Af Amer >90  >90 (mL/min)    GFR calc Af Amer >90  >90 (mL/min)   LIPASE, BLOOD     Status: Abnormal   Collection Time   08/27/11 12:35 PM      Component Value Range Comment   Lipase 62 (*) 11 - 59 (U/L)   BASIC METABOLIC PANEL     Status: Abnormal   Collection Time   08/28/11 12:32 AM      Component Value Range Comment   Sodium 133 (*) 135 - 145 (mEq/L)    Potassium 2.6 (*) 3.5 - 5.1 (mEq/L)    Chloride 99  96 - 112 (mEq/L)    CO2 24  19 - 32 (mEq/L)    Glucose, Bld 87  70 - 99 (mg/dL)    BUN 10  6 - 23 (mg/dL)    Creatinine, Ser 1.61  0.50 - 1.10 (mg/dL)    Calcium 8.0 (*) 8.4 - 10.5 (mg/dL)    GFR calc non Af Amer >90  >90 (mL/min)    GFR calc Af Amer >90  >90 (mL/min)   CBC     Status: Abnormal   Collection Time   08/28/11 12:32 AM      Component Value Range Comment   WBC 9.7  4.0 - 10.5 (K/uL)    RBC 3.18 (*) 3.87 - 5.11 (MIL/uL)    Hemoglobin 10.9 (*) 12.0 - 15.0 (g/dL)    HCT 09.6 (*)  04.5 - 46.0 (%)    MCV 100.3 (*) 78.0 - 100.0 (fL)    MCH 34.3 (*) 26.0 - 34.0 (pg)    MCHC 34.2  30.0 - 36.0 (g/dL)    RDW 40.9  81.1 - 91.4 (%)    Platelets 369  150 - 400 (K/uL)   MAGNESIUM     Status: Normal   Collection Time   08/28/11 12:32 AM      Component Value Range Comment   Magnesium 1.7  1.5 - 2.5 (mg/dL)   CARDIAC PANEL(CRET KIN+CKTOT+MB+TROPI)     Status: Normal   Collection Time   08/28/11 12:32 AM      Component Value Range Comment   Total CK 23  7 - 177 (U/L)    CK, MB 1.7  0.3 - 4.0 (ng/mL)    Troponin I <0.30  <0.30 (ng/mL)    Relative Index RELATIVE INDEX IS INVALID  0.0 - 2.5    TSH     Status: Normal   Collection Time   08/28/11  4:18 AM      Component Value Range Comment   TSH 0.396  0.350 - 4.500 (uIU/mL)   BASIC METABOLIC PANEL     Status: Abnormal   Collection Time   08/28/11  4:18 AM      Component Value Range Comment   Sodium 136  135 - 145 (mEq/L)    Potassium 3.4 (*) 3.5 - 5.1 (mEq/L)    Chloride 103  96 - 112 (mEq/L)    CO2 25  19 - 32 (mEq/L)    Glucose, Bld 118 (*) 70 - 99 (mg/dL)    BUN 10  6 - 23 (mg/dL)    Creatinine, Ser 7.82  0.50 - 1.10 (mg/dL)    Calcium 8.3 (*) 8.4 - 10.5 (mg/dL)    GFR calc non Af Amer >90  >90 (mL/min)    GFR calc Af Amer >90  >90 (mL/min)     Ct Chest W Contrast  08/27/2011  *RADIOLOGY REPORT*  Clinical Data: Abnormality of the lung bases on recent CT abdomen.  CT CHEST WITH CONTRAST  Technique:  Multidetector CT imaging of the chest was performed following the standard protocol during bolus administration of intravenous contrast.  Contrast: OMNIPAQUE IOHEXOL 350 MG/ML SOLN  Comparison: CT chest dated 12/26/2010  Findings: Multifocal central patchy/ground-glass opacities throughout all lobes.  Additional paramediastinal opacities in the upper lobes with possible air bronchograms (series 3/image 16) and/or fibrosis.  Additional peripheral patchy/nodular opacities scattered throughout all lobes, measuring up to 11  mm at the right lung base (series 3/image 46).  When compared to the prior CT chest, these findings are new.  No underlying chronic interstitial lung disease.  Differential considerations include atypical infection (including Pneumocystis pneumonia in the setting of immunosuppression/HIV), inflammatory disease (such as hypersensitivity pneumonitis or drug toxicity), or possibly interstitial lung disease related to underlying collagen vascular disease or vasculitis.  The appearance does not suggest bacterial/lobar pneumonia.  Given the associated patchy opacities, metastatic disease and septic emboli are considered unlikely.  Visualized thyroid is unremarkable.  The heart is normal in size.  No pericardial effusion.  Small right paratracheal/mediastinal/right hilar lymph nodes measuring up to 10 mm short axis, possibly reactive. No suspicious axillary lymphadenopathy.  Visualized upper abdomen is unremarkable.  Mild degenerative changes of the visualized thoracolumbar spine.  IMPRESSION: Multifocal patchy/nodular opacities throughout all lobes, as described above, new from 2012 CT chest.  Primary differential considerations include atypical infection or inflammatory disease, as described above.  Metastatic disease or septic emboli are considered unlikely.  Original Report Authenticated By: Charline Bills, M.D.   Ct Abdomen Pelvis W Contrast  08/27/2011  *RADIOLOGY REPORT*  Clinical Data: Abdominal pain.  History of colitis.  White count 9.80.  Nausea, vomiting, diarrhea.  Chronic back pain.  CT ABDOMEN AND PELVIS WITH CONTRAST  Technique:  Multidetector CT imaging of the abdomen and pelvis was performed following the standard protocol during bolus administration of intravenous contrast.  Contrast: OMNIPAQUE IOHEXOL 300 MG/ML  SOLN  Comparison: 06/07/2011  Findings: The lung bases are markedly abnormal.  Multiple pulmonary nodules have developed since prior study.  These vary in size from approximately 1 mm  to 1.6 cm.  Nodules are circumscribed and irregular.  No pleural effusions are identified.  No focal abnormality identified in the liver, spleen, pancreas, adrenal glands, or kidneys. The appendix is well seen and has a normal appearance.  Gallbladder is absent.  The stomach and small bowel loops have a normal appearance. Colonic loops are normal in appearance. The uterus is present.  No evidence for adnexal mass.  Patient has had bilateral tubal ligation.  No free pelvic fluid or pelvic adenopathy.  IMPRESSION:  1.  Markedly abnormal lung bases with multiple large and small pulmonary masses.  Differential diagnosis includes inflammatory/infectious process, metastases, other new process, septic emboli.  Recommend further evaluation with CT of the chest with contrast to evaluate for adenopathy in further characterization of these nodules. 2.  No evidence for acute appendicitis or colitis. 3.  Status post bilateral tubal ligation.  Original Report Authenticated By: Patterson Hammersmith, M.D.   ROS-see HPI   HEENT:   No-  headaches, difficulty swallowing, tooth/dental problems, +sore throat,       No-  sneezing, itching, ear ache, nasal congestion, post nasal drip,  CV:  No-  Anginal or pleuritic chest pain, orthopnea, PND, swelling in lower extremities, anasarca,                                  dizziness, palpitations Resp: _+  shortness of breath with exertion or at rest.              No-   productive cough, +Mild non-productive cough,  No- coughing up of blood.              No-   change in color of mucus.  No- wheezing.   Skin: No-   rash or lesions. GI:  No-   heartburn, indigestion, vomiting GU: No-   dysuria, change in color of urine, no urgency or frequency.  No- flank pain. MS:  No-   joint pain or swelling.  No- decreased range of motion.  No- back pain. Neuro-     See HPI Psych:  No- change in mood or affect. + depression or anxiety.  No memory loss.   PHYS EXAM: Blood pressure 101/68, pulse  100, temperature 97.4 F (36.3 C), temperature source Oral, resp. rate 16, height 5\' 4"  (1.626 m), weight 59.24 kg (130 lb 9.6 oz), last menstrual period 08/12/2011, SpO2 97.00%. OBJ- Physical Exam General- Alert, Oriented, Affect-appropriate, Distress- none acute. Reading Skin- rash-none, lesions- none, excoriation- none Lymphadenopathy- none found Head- atraumatic            Eyes- Gross vision intact, PERRLA, conjunctivae and secretions clear            Ears- Hearing, canals-normal            Nose- Clear, no-Septal dev, mucus, polyps, erosion, perforation             Throat- mucosa clear , drainage- none, tonsils- atrophic Neck- flexible , trachea midline, no stridor , thyroid nl, carotid no bruit Chest - symmetrical excursion , unlabored           Heart/CV- RRR , no murmur , no gallop  , no rub, nl s1 s2                           - JVD- none , edema- none, stasis changes- none, varices- none           Lung- clear to P&A, wheeze- none, +cough- once lightly , dullness-none, rub- none. No crackles or rhonchi           Chest wall-  Abd- tender-not to light pressure, distended-no, bowel sounds-present, HSM- no Br/ Gen/ Rectal- Not done, not indicated Extrem- cyanosis- none, clubbing, none, atrophy- none, strength- nl Neuro- grossly intact to observation     Assessment/Plan:  1) Nodular pulmonary infiltrates atypical pneumonia- dramatic now on CT but new compared with CXR of 07/24/11. She is not significantly immunocompromised by 10 mg of hydrocortisone bid. Abrupt onset with fever makes infection very likely. Her trip to [place] is interesting- even acute Histo pneumonia might do this.  I favor an early septicemia related to her colitis, with hematogenous rather than respiratory spread.  Bronchoscopy will be the dx method of choice if needed. Not available on weekend, and she looks stable enough that her current broad spectrum abx coverage may turn this around. Plan- Follow daily CXR, CBC,  oximetry. I have ordered pneumocystis DFA,  procalcitonin check although this may be late in course.           DVT prophy with SCDs and ambulation till decision made about invasive bio[psy.  Note hypokalemia  Norris Brumbach D 08/28/2011, 1:15 PM

## 2011-08-28 NOTE — Progress Notes (Addendum)
Patient seen and examined by Dr. Lovell Sheehan in the morning and By me today We'll check HIV antibody Mycoplasma PCR Legionella urine antigen ANA and ANCA   Pulmonary consultation Dr Maple Hudson called ,  because of bilateral pulmonary nodules, may need a bronchoscopy Blood pressure is borderline low therefore we will bolus 500 cc Continue broad-spectrum antibiotics Change stress dose Solu-Cortef to IV Solu-Medrol, Patient will be monitored on telemetry

## 2011-08-28 NOTE — Progress Notes (Signed)
Pt denies any symptom improvement. States headache has increased in severity. Pt request OJ x2 and chocolate ice cream for c/o sore throat. Requests provided.  Pt sats 94% RA at rest; HR=93. Pt states "I just need to get some sleep." Encouraged pt to do so. Will administer PRN Trazodone at pt's request.

## 2011-08-28 NOTE — Progress Notes (Signed)
Pt c/o headache, request Ativan IV form; Phenergan; Dilaudid; and Tylenol. Continuous pulse ox applied, instructed pt that she is to call for assist when getting out of bed.  O2 sats need to be monitored for pt's c/o SOB. O2 sats 93% on RA. Pt sitting sideways in chair. Pt educated that RN would not be able to give multiple sedative medications at the same time due to the risk of hypoxia. Advised that bed alarm would be activated for her safety. Pt states Dr. Susie Cassette "should be more professional." Pt request that RN call MD for morphine request. Again explained to pt the risk of oversedation and hypoxia. Ativan; Phenergan & Tylenol administered to pt per her request. Explained to pt, will evaluate >1hr for the need of Dilaudid. Request pt to allow medication time to work. Encouragement and education provided.

## 2011-08-29 ENCOUNTER — Inpatient Hospital Stay (HOSPITAL_COMMUNITY): Payer: 59

## 2011-08-29 DIAGNOSIS — R197 Diarrhea, unspecified: Secondary | ICD-10-CM

## 2011-08-29 DIAGNOSIS — J189 Pneumonia, unspecified organism: Secondary | ICD-10-CM

## 2011-08-29 DIAGNOSIS — R079 Chest pain, unspecified: Secondary | ICD-10-CM

## 2011-08-29 DIAGNOSIS — E876 Hypokalemia: Secondary | ICD-10-CM

## 2011-08-29 LAB — LEGIONELLA ANTIGEN, URINE

## 2011-08-29 LAB — DIFFERENTIAL
Basophils Absolute: 0 10*3/uL (ref 0.0–0.1)
Eosinophils Absolute: 0 10*3/uL (ref 0.0–0.7)
Lymphocytes Relative: 3 % — ABNORMAL LOW (ref 12–46)
Neutro Abs: 15.5 10*3/uL — ABNORMAL HIGH (ref 1.7–7.7)
Neutrophils Relative %: 95 % — ABNORMAL HIGH (ref 43–77)

## 2011-08-29 LAB — CBC
MCHC: 34.3 g/dL (ref 30.0–36.0)
Platelets: 466 10*3/uL — ABNORMAL HIGH (ref 150–400)
RDW: 12.6 % (ref 11.5–15.5)

## 2011-08-29 MED ORDER — NORTRIPTYLINE HCL 25 MG PO CAPS
25.0000 mg | ORAL_CAPSULE | Freq: Every day | ORAL | Status: DC
  Administered 2011-08-29 – 2011-09-01 (×4): 25 mg via ORAL
  Filled 2011-08-29 (×6): qty 1

## 2011-08-29 MED ORDER — LEVOTHYROXINE SODIUM 50 MCG PO TABS
50.0000 ug | ORAL_TABLET | Freq: Every day | ORAL | Status: DC
  Administered 2011-08-29 – 2011-09-02 (×5): 50 ug via ORAL
  Filled 2011-08-29 (×5): qty 1

## 2011-08-29 MED ORDER — SUCRALFATE 1 GM/10ML PO SUSP
1.0000 g | Freq: Four times a day (QID) | ORAL | Status: DC
  Administered 2011-08-29 – 2011-09-02 (×12): 1 g via ORAL
  Filled 2011-08-29 (×22): qty 10

## 2011-08-29 MED ORDER — MORPHINE SULFATE 2 MG/ML IJ SOLN
1.0000 mg | INTRAMUSCULAR | Status: DC | PRN
  Administered 2011-08-29 (×2): 1 mg via INTRAVENOUS
  Filled 2011-08-29 (×2): qty 1

## 2011-08-29 MED ORDER — CLONAZEPAM 1 MG PO TABS
1.0000 mg | ORAL_TABLET | Freq: Three times a day (TID) | ORAL | Status: DC | PRN
  Administered 2011-08-29 – 2011-09-01 (×7): 1 mg via ORAL
  Filled 2011-08-29 (×7): qty 1

## 2011-08-29 MED ORDER — TRAMADOL HCL 50 MG PO TABS
50.0000 mg | ORAL_TABLET | Freq: Four times a day (QID) | ORAL | Status: DC | PRN
  Administered 2011-08-30 (×2): 50 mg via ORAL
  Filled 2011-08-29 (×2): qty 1

## 2011-08-29 MED ORDER — MORPHINE SULFATE 2 MG/ML IJ SOLN
1.0000 mg | INTRAMUSCULAR | Status: DC | PRN
  Administered 2011-08-29 – 2011-09-02 (×20): 1 mg via INTRAVENOUS
  Filled 2011-08-29 (×21): qty 1

## 2011-08-29 MED ORDER — METOCLOPRAMIDE HCL 10 MG PO TABS
10.0000 mg | ORAL_TABLET | Freq: Four times a day (QID) | ORAL | Status: DC
  Administered 2011-08-29 – 2011-09-02 (×15): 10 mg via ORAL
  Filled 2011-08-29 (×19): qty 1

## 2011-08-29 MED ORDER — ADULT MULTIVITAMIN W/MINERALS CH
1.0000 | ORAL_TABLET | Freq: Every day | ORAL | Status: DC
  Administered 2011-08-29 – 2011-09-02 (×5): 1 via ORAL
  Filled 2011-08-29 (×5): qty 1

## 2011-08-29 MED ORDER — BUDESONIDE 3 MG PO CP24
9.0000 mg | ORAL_CAPSULE | ORAL | Status: DC
  Administered 2011-08-29 – 2011-09-01 (×4): 9 mg via ORAL
  Filled 2011-08-29 (×6): qty 3

## 2011-08-29 NOTE — Progress Notes (Signed)
Subjective: Requested to change morphine to dilaudid  Now wanting dilaudid to be changed back to morphine Denies CP,SOB  Objective: Vital signs in last 24 hours: Filed Vitals:   08/28/11 2120 08/29/11 0205 08/29/11 0515 08/29/11 0554  BP: 131/83 119/74 122/76   Pulse: 103 97 84 85  Temp: 97.9 F (36.6 C) 97.9 F (36.6 C) 97.5 F (36.4 C)   TempSrc: Oral Oral Oral   Resp: 16 16 16    Height:      Weight:      SpO2: 93% 91% 92% 93%    Intake/Output Summary (Last 24 hours) at 08/29/11 1034 Last data filed at 08/29/11 1610  Gross per 24 hour  Intake   3340 ml  Output   1050 ml  Net   2290 ml    Weight change:   General- Alert, Oriented, Affect-appropriate, Distress- none acute. Reading  Skin- rash-none, lesions- none, excoriation- none  Lymphadenopathy- none found  Head- atraumatic  Eyes- Gross vision intact, PERRLA, conjunctivae and secretions clear  Ears- Hearing, canals-normal  Nose- Clear, no-Septal dev, mucus, polyps, erosion, perforation  Throat- mucosa clear , drainage- none, tonsils- atrophic  Neck- flexible , trachea midline, no stridor , thyroid nl, carotid no bruit  Chest - symmetrical excursion , unlabored  Heart/CV- RRR , no murmur , no gallop , no rub, nl s1 s2  - JVD- none , edema- none, stasis changes- none, varices- none  Lung- clear to P&A, wheeze- none, +cough- once lightly , dullness-none, rub- none. No crackles or rhonchi  Chest wall-  Abd- tender-not to light pressure, distended-no, bowel sounds-present, HSM- no  Br/ Gen/ Rectal- Not done, not indicated  Extrem- cyanosis- none, clubbing, none, atrophy- none, strength- nl  Neuro- grossly intact to observation   Lab Results: Results for orders placed during the hospital encounter of 08/27/11 (from the past 24 hour(s))  HIV ANTIBODY (ROUTINE TESTING)     Status: Normal   Collection Time   08/28/11 11:48 AM      Component Value Range   HIV NON REACTIVE  NON REACTIVE   CBC     Status: Abnormal   Collection Time   08/29/11  4:41 AM      Component Value Range   WBC 16.3 (*) 4.0 - 10.5 (K/uL)   RBC 3.00 (*) 3.87 - 5.11 (MIL/uL)   Hemoglobin 10.4 (*) 12.0 - 15.0 (g/dL)   HCT 96.0 (*) 45.4 - 46.0 (%)   MCV 101.0 (*) 78.0 - 100.0 (fL)   MCH 34.7 (*) 26.0 - 34.0 (pg)   MCHC 34.3  30.0 - 36.0 (g/dL)   RDW 09.8  11.9 - 14.7 (%)   Platelets 466 (*) 150 - 400 (K/uL)  DIFFERENTIAL     Status: Abnormal   Collection Time   08/29/11  4:41 AM      Component Value Range   Neutrophils Relative 95 (*) 43 - 77 (%)   Lymphocytes Relative 3 (*) 12 - 46 (%)   Monocytes Relative 2 (*) 3 - 12 (%)   Eosinophils Relative 0  0 - 5 (%)   Basophils Relative 0  0 - 1 (%)   Neutro Abs 15.5 (*) 1.7 - 7.7 (K/uL)   Lymphs Abs 0.5 (*) 0.7 - 4.0 (K/uL)   Monocytes Absolute 0.3  0.1 - 1.0 (K/uL)   Eosinophils Absolute 0.0  0.0 - 0.7 (K/uL)   Basophils Absolute 0.0  0.0 - 0.1 (K/uL)   Smear Review MORPHOLOGY UNREMARKABLE  Micro: Recent Results (from the past 240 hour(s))  CLOSTRIDIUM DIFFICILE BY PCR     Status: Normal   Collection Time   08/22/11  3:03 PM      Component Value Range Status Comment   C difficile by pcr NEGATIVE  NEGATIVE  Final   STOOL CULTURE     Status: Normal   Collection Time   08/22/11  3:45 PM      Component Value Range Status Comment   Specimen Description STOOL   Final    Special Requests NONE   Final    Culture     Final    Value: NO SALMONELLA, SHIGELLA, CAMPYLOBACTER, YERSINIA, OR E.COLI 0157:H7 ISOLATED   Report Status 08/26/2011 FINAL   Final   OVA AND PARASITE EXAMINATION     Status: Normal   Collection Time   08/22/11  3:45 PM      Component Value Range Status Comment   Specimen Description STOOL   Final    Special Requests NONE   Final    Ova and parasites NO OVA OR PARASITES SEEN   Final    Report Status 08/23/2011 FINAL   Final     Studies/Results: Ct Chest W Contrast  08/27/2011  *RADIOLOGY REPORT*  Clinical Data: Abnormality of the lung bases on recent  CT abdomen.  CT CHEST WITH CONTRAST  Technique:  Multidetector CT imaging of the chest was performed following the standard protocol during bolus administration of intravenous contrast.  Contrast: OMNIPAQUE IOHEXOL 350 MG/ML SOLN  Comparison: CT chest dated 12/26/2010  Findings: Multifocal central patchy/ground-glass opacities throughout all lobes.  Additional paramediastinal opacities in the upper lobes with possible air bronchograms (series 3/image 16) and/or fibrosis.  Additional peripheral patchy/nodular opacities scattered throughout all lobes, measuring up to 11 mm at the right lung base (series 3/image 46).  When compared to the prior CT chest, these findings are new.  No underlying chronic interstitial lung disease.  Differential considerations include atypical infection (including Pneumocystis pneumonia in the setting of immunosuppression/HIV), inflammatory disease (such as hypersensitivity pneumonitis or drug toxicity), or possibly interstitial lung disease related to underlying collagen vascular disease or vasculitis.  The appearance does not suggest bacterial/lobar pneumonia.  Given the associated patchy opacities, metastatic disease and septic emboli are considered unlikely.  Visualized thyroid is unremarkable.  The heart is normal in size.  No pericardial effusion.  Small right paratracheal/mediastinal/right hilar lymph nodes measuring up to 10 mm short axis, possibly reactive. No suspicious axillary lymphadenopathy.  Visualized upper abdomen is unremarkable.  Mild degenerative changes of the visualized thoracolumbar spine.  IMPRESSION: Multifocal patchy/nodular opacities throughout all lobes, as described above, new from 2012 CT chest.  Primary differential considerations include atypical infection or inflammatory disease, as described above.  Metastatic disease or septic emboli are considered unlikely.  Original Report Authenticated By: Charline Bills, M.D.   Ct Abdomen Pelvis W  Contrast  08/27/2011  *RADIOLOGY REPORT*  Clinical Data: Abdominal pain.  History of colitis.  White count 9.80.  Nausea, vomiting, diarrhea.  Chronic back pain.  CT ABDOMEN AND PELVIS WITH CONTRAST  Technique:  Multidetector CT imaging of the abdomen and pelvis was performed following the standard protocol during bolus administration of intravenous contrast.  Contrast: OMNIPAQUE IOHEXOL 300 MG/ML  SOLN  Comparison: 06/07/2011  Findings: The lung bases are markedly abnormal.  Multiple pulmonary nodules have developed since prior study.  These vary in size from approximately 1 mm to 1.6 cm.  Nodules are circumscribed and  irregular.  No pleural effusions are identified.  No focal abnormality identified in the liver, spleen, pancreas, adrenal glands, or kidneys. The appendix is well seen and has a normal appearance.  Gallbladder is absent.  The stomach and small bowel loops have a normal appearance. Colonic loops are normal in appearance. The uterus is present.  No evidence for adnexal mass.  Patient has had bilateral tubal ligation.  No free pelvic fluid or pelvic adenopathy.  IMPRESSION:  1.  Markedly abnormal lung bases with multiple large and small pulmonary masses.  Differential diagnosis includes inflammatory/infectious process, metastases, other new process, septic emboli.  Recommend further evaluation with CT of the chest with contrast to evaluate for adenopathy in further characterization of these nodules. 2.  No evidence for acute appendicitis or colitis. 3.  Status post bilateral tubal ligation.  Original Report Authenticated By: Patterson Hammersmith, M.D.    Medications:  Scheduled Meds:   . sodium chloride   Intravenous STAT  . ceFEPime (MAXIPIME) IV  1 g Intravenous Q8H  . enoxaparin  40 mg Subcutaneous Q24H  . methylPREDNISolone (SOLU-MEDROL) injection  40 mg Intravenous Q6H  . nicotine  14 mg Transdermal Daily  . piperacillin-tazobactam (ZOSYN)  IV  3.375 g Intravenous Once  .  saccharomyces boulardii  250 mg Oral BID  . sodium chloride  500 mL Intravenous Once  . vancomycin  750 mg Intravenous Q12H  . DISCONTD: hydrocortisone sod succinate (SOLU-CORTEF) injection  100 mg Intravenous Q8H   Continuous Infusions:   . 0.9 % NaCl with KCl 20 mEq / L 125 mL/hr at 08/29/11 0032   PRN Meds:.acetaminophen, acetaminophen, albuterol, alum & mag hydroxide-simeth, morphine injection, oxyCODONE, promethazine, traZODone, DISCONTD:  HYDROmorphone (DILAUDID) injection, DISCONTD: LORazepam, DISCONTD: LORazepam, DISCONTD:  morphine injection  Assessment: Active Problems:  Adrenal insufficiency  Hypokalemia  Atypical pneumonia  Chronic diarrhea  Collagenous colitis  Chest pain   Plan: 1) Nodular pulmonary infiltrates atypical pneumonia Pulmonary consult obtained  contuinue to follow labs Broad spectrum abx,  Bronchoscopy to be decided by PULMONARY  2. Chronic pain syndrome Clearly narcotic dependent Resume home meds  minimise iv narcotica, Explained this to the patient in the presence of RN We all need to be on the same page with her care She tends to be manipulative and use on provider against another to obtain more and more narcotics Anxiety is a huge component and she may need psyche consult This will be offered to her   3.addisons' on iv steroids  4.Anxiety and depression  Restarted home meds      LOS: 2 days   [facility] 08/29/2011, 10:34 AM

## 2011-08-29 NOTE — Progress Notes (Signed)
Received report from Cory Munch, RN. No change from initial am assessment. Will continue to follow the plan of care.

## 2011-08-29 NOTE — Progress Notes (Signed)
Upon return to room to administer PRN requested by patient, pt found to be sound asleep. O2 sats= 94%, HR=97. Pt awoke, c/o 9/10 abd pain. PRN Dilaudid given IV.

## 2011-08-29 NOTE — Progress Notes (Signed)
Pt c/o severe pain. Oxy IR offered, pt refused. Pt states she would rather take Oxy IR when she is "more awake to assess the effectiveness of "new" med."  Dilaudid IV given per pt request. Educated, RN will not call MD at this hour unless pt is critically ill or an emergency. Support provided.

## 2011-08-30 ENCOUNTER — Inpatient Hospital Stay (HOSPITAL_COMMUNITY): Payer: 59

## 2011-08-30 DIAGNOSIS — J189 Pneumonia, unspecified organism: Principal | ICD-10-CM

## 2011-08-30 LAB — COMPREHENSIVE METABOLIC PANEL
Albumin: 2.3 g/dL — ABNORMAL LOW (ref 3.5–5.2)
Alkaline Phosphatase: 89 U/L (ref 39–117)
BUN: 8 mg/dL (ref 6–23)
Potassium: 3.9 mEq/L (ref 3.5–5.1)
Total Protein: 5.4 g/dL — ABNORMAL LOW (ref 6.0–8.3)

## 2011-08-30 LAB — CBC
HCT: 29 % — ABNORMAL LOW (ref 36.0–46.0)
MCV: 102.5 fL — ABNORMAL HIGH (ref 78.0–100.0)
RBC: 2.83 MIL/uL — ABNORMAL LOW (ref 3.87–5.11)
RDW: 12.9 % (ref 11.5–15.5)
WBC: 20 10*3/uL — ABNORMAL HIGH (ref 4.0–10.5)

## 2011-08-30 LAB — ANA: Anti Nuclear Antibody(ANA): NEGATIVE

## 2011-08-30 LAB — ANCA SCREEN W REFLEX TITER
Atypical p-ANCA Screen: NEGATIVE
c-ANCA Screen: NEGATIVE

## 2011-08-30 LAB — CARDIAC PANEL(CRET KIN+CKTOT+MB+TROPI)
Relative Index: INVALID (ref 0.0–2.5)
Troponin I: <0.3 ng/mL (ref <0.30)

## 2011-08-30 MED ORDER — KETOROLAC TROMETHAMINE 30 MG/ML IJ SOLN
30.0000 mg | Freq: Three times a day (TID) | INTRAMUSCULAR | Status: DC | PRN
  Administered 2011-08-30: 30 mg via INTRAVENOUS
  Filled 2011-08-30: qty 1

## 2011-08-30 MED ORDER — SODIUM CHLORIDE 0.9 % IV SOLN
INTRAVENOUS | Status: DC
  Administered 2011-08-30: 75 mL/h via INTRAVENOUS
  Administered 2011-08-31: 02:00:00 via INTRAVENOUS

## 2011-08-30 MED ORDER — MORPHINE SULFATE 4 MG/ML IJ SOLN
4.0000 mg | Freq: Once | INTRAMUSCULAR | Status: AC
  Administered 2011-08-30: 4 mg via INTRAVENOUS
  Filled 2011-08-30: qty 1

## 2011-08-30 MED ORDER — METHOCARBAMOL 500 MG PO TABS
500.0000 mg | ORAL_TABLET | Freq: Four times a day (QID) | ORAL | Status: DC | PRN
  Administered 2011-08-30 – 2011-09-01 (×5): 500 mg via ORAL
  Filled 2011-08-30 (×5): qty 1

## 2011-08-30 NOTE — Progress Notes (Signed)
   CARE MANAGEMENT NOTE 08/30/2011  Patient:  Dawn Frost, Dawn Frost   Account Number:  [id number]  Date Initiated:  08/30/2011  Documentation initiated by:  Jiles Crocker  Subjective/Objective Assessment:   ADMITTED WITH PNEUMONIA     Action/Plan:   PCP: Juan Quam, MD; INDEPENDENT, LIVES WITH SPOUSE   Anticipated DC Date:  09/06/2011   Anticipated DC Plan:  HOME/SELF CARE          Status of service:  In process, will continue to follow Medicare Important Message given?  NA - LOS <3 / Initial given by admissions (If response is "NO", the following Medicare IM given date fields will be blank)  Per UR Regulation:  Reviewed for med. necessity/level of care/duration of stay  Comments:  08/30/2011- B Caria Transue RN, BSN, MHA

## 2011-08-30 NOTE — Progress Notes (Addendum)
Subjective: Reporting shoulder pain overnight Requesting more and more narcotics overnight.  Objective: Vital signs in last 24 hours: Filed Vitals:   08/29/11 1424 08/29/11 2130 08/30/11 0211 08/30/11 0545  BP: 135/90 158/94 146/82 155/84  Pulse: 90 77 76 53  Temp: 97.6 F (36.4 C) 97.8 F (36.6 C) 97.6 F (36.4 C) 97.3 F (36.3 C)  TempSrc: Oral Oral Oral Oral  Resp: 18 16 16 16   Height:      Weight:      SpO2: 95% 96% 95% 96%   No intake or output data in the 24 hours ending 08/30/11 0928  Weight change:  General- Alert, Oriented, Affect-appropriate, Distress- none acute. Reading  Skin- rash-none, lesions- none, excoriation- none  Lymphadenopathy- none found  Head- atraumatic  Eyes- Gross vision intact, PERRLA, conjunctivae and secretions clear  Ears- Hearing, canals-normal  Nose- Clear, no-Septal dev, mucus, polyps, erosion, perforation  Throat- mucosa clear , drainage- none, tonsils- atrophic  Neck- flexible , trachea midline, no stridor , thyroid nl, carotid no bruit  Chest - symmetrical excursion , unlabored  Heart/CV- RRR , no murmur , no gallop , no rub, nl s1 s2  - JVD- none , edema- none, stasis changes- none, varices- none  Lung- clear to P&A, wheeze- none, +cough- once lightly , dullness-none, rub- none. No crackles or rhonchi  Chest wall-  Abd- tender-not to light pressure, distended-no, bowel sounds-present, HSM- no  Br/ Gen/ Rectal- Not done, not indicated  Extrem- cyanosis- none, clubbing, none, atrophy- none, strength- nl  Neuro- grossly intact to observation    Lab Results: Results for orders placed during the hospital encounter of 08/27/11 (from the past 24 hour(s))  COMPREHENSIVE METABOLIC PANEL     Status: Abnormal   Collection Time   08/30/11  4:15 AM      Component Value Range   Sodium 140  135 - 145 (mEq/L)   Potassium 3.9  3.5 - 5.1 (mEq/L)   Chloride 107  96 - 112 (mEq/L)   CO2 22  19 - 32 (mEq/L)   Glucose, Bld 130 (*) 70 - 99 (mg/dL)     BUN 8  6 - 23 (mg/dL)   Creatinine, Ser 4.09 (*) 0.50 - 1.10 (mg/dL)   Calcium 8.5  8.4 - 81.1 (mg/dL)   Total Protein 5.4 (*) 6.0 - 8.3 (g/dL)   Albumin 2.3 (*) 3.5 - 5.2 (g/dL)   AST 12  0 - 37 (U/L)   ALT 11  0 - 35 (U/L)   Alkaline Phosphatase 89  39 - 117 (U/L)   Total Bilirubin <0.1 (*) 0.3 - 1.2 (mg/dL)   GFR calc non Af Amer >90  >90 (mL/min)   GFR calc Af Amer >90  >90 (mL/min)  CBC     Status: Abnormal   Collection Time   08/30/11  4:15 AM      Component Value Range   WBC 20.0 (*) 4.0 - 10.5 (K/uL)   RBC 2.83 (*) 3.87 - 5.11 (MIL/uL)   Hemoglobin 9.7 (*) 12.0 - 15.0 (g/dL)   HCT 91.4 (*) 78.2 - 46.0 (%)   MCV 102.5 (*) 78.0 - 100.0 (fL)   MCH 34.3 (*) 26.0 - 34.0 (pg)   MCHC 33.4  30.0 - 36.0 (g/dL)   RDW 95.6  21.3 - 08.6 (%)   Platelets 363  150 - 400 (K/uL)     Micro: Recent Results (from the past 240 hour(s))  CLOSTRIDIUM DIFFICILE BY PCR     Status: Normal  Collection Time   08/22/11  3:03 PM      Component Value Range Status Comment   C difficile by pcr NEGATIVE  NEGATIVE  Final   STOOL CULTURE     Status: Normal   Collection Time   08/22/11  3:45 PM      Component Value Range Status Comment   Specimen Description STOOL   Final    Special Requests NONE   Final    Culture     Final    Value: NO SALMONELLA, SHIGELLA, CAMPYLOBACTER, YERSINIA, OR E.COLI 0157:H7 ISOLATED   Report Status 08/26/2011 FINAL   Final   OVA AND PARASITE EXAMINATION     Status: Normal   Collection Time   08/22/11  3:45 PM      Component Value Range Status Comment   Specimen Description STOOL   Final    Special Requests NONE   Final    Ova and parasites NO OVA OR PARASITES SEEN   Final    Report Status 08/23/2011 FINAL   Final     Studies/Results: Dg Chest 2 View  08/29/2011  *RADIOLOGY REPORT*  Clinical Data: Shortness of breath. Multi focal pneumonia  CHEST - 2 VIEW  Comparison: Chest CT dated 08/27/2011 and chest x-ray dated 07/24/2011  Findings: The patient has extensive  patchy pulmonary infiltrates primarily perihilar in distribution but involving all areas of both lungs.  No effusions.  Heart size and vascularity are normal.  No osseous abnormality.  IMPRESSION: Extensive patchy bilateral pulmonary infiltrates, essentially unchanged since prior study. I suspect this patient has an atypical infection.  Original Report Authenticated By: Gwynn Burly, M.D.    Medications:  Scheduled Meds:   . budesonide  9 mg Oral BH-q7a  . ceFEPime (MAXIPIME) IV  1 g Intravenous Q8H  . enoxaparin  40 mg Subcutaneous Q24H  . levothyroxine  50 mcg Oral Daily  . methylPREDNISolone (SOLU-MEDROL) injection  40 mg Intravenous Q6H  . metoCLOPramide  10 mg Oral Q6H  .  morphine injection  4 mg Intravenous Once  . mulitivitamin with minerals  1 tablet Oral Daily  . nicotine  14 mg Transdermal Daily  . nortriptyline  25 mg Oral QHS  . saccharomyces boulardii  250 mg Oral BID  . sucralfate  1 g Oral QID  . vancomycin  750 mg Intravenous Q12H  . DISCONTD: piperacillin-tazobactam (ZOSYN)  IV  3.375 g Intravenous Once   Continuous Infusions:   . 0.9 % NaCl with KCl 20 mEq / L 125 mL/hr at 08/30/11 0030   PRN Meds:.acetaminophen, acetaminophen, albuterol, alum & mag hydroxide-simeth, clonazePAM, morphine injection, oxyCODONE, promethazine, traMADol, traZODone, DISCONTD:  HYDROmorphone (DILAUDID) injection, DISCONTD: LORazepam, DISCONTD: LORazepam, DISCONTD:  morphine injection   Assessment: Active Problems:  Adrenal insufficiency  Hypokalemia  Atypical pneumonia  Chronic diarrhea  Collagenous colitis  Chest pain   Plan: #1 nodular pulmonary infiltrates pulmonary following continue broad-spectrum antibiotics, chest x-ray done yesterday shows extensive patchy bilateral pulmonary infiltrates are essentially unchanged  #2 chronic pain syndrome describing new areas of pain, continue current pain regimen at this time, try to taper the patient from IV narcotics in preparation  for discharge over the next couple of days, whenever its ok with Pulmonary Cautious adjustments in pain regimen as the patient is clearly narcotic dependent   #3 leukocytosis likely secondary to IV steroids/pneumonia  #4 Addison's disease on hydrocortisone at home currently on IV Solu-Medrol #5 anxiety and depression continue home medications #6 just a negative workup  thus far   LOS: 3 days   [facility] 08/30/2011, 9:28 AM

## 2011-08-30 NOTE — Progress Notes (Signed)
Pt has new onset of shoulder pain that she describes "This is the worst pain I've ever felt, it is unbearable. Feels like someone is squeezing my arm and they will not let go." Tried giving pt PRN pain medication that is scheduled, tried heat packs and ice packs. Pain still not under control. Paged NP on call, new orders given and carried out. Pt pain is still 9/10. Will continue to monitor and keep pt as comfortable as possible.

## 2011-08-30 NOTE — Progress Notes (Signed)
Patient transferred to Rm 1314, report given to [facility].

## 2011-08-30 NOTE — Progress Notes (Addendum)
Reason for Consult: Pulmonary consult - nodular infiltrates Referring Physician: Triad Dawn Frost is an 46 y.o. female.  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 Long hx of collagenous colitis. 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 Normal CXR 4/13  Interval:   Still with cough and chest pain. No blood cults obtained.  Not able to produce sputum for PCP stain.  PCT level normal.   PHYS EXAM: Blood pressure 155/84, pulse 53, temperature 97.3 F (36.3 C), temperature source Oral, resp. rate 16, height 5\' 4"  (1.626 m), weight 59.24 kg (130 lb 9.6 oz), last menstrual period 08/12/2011, SpO2 96.00%. OBJ- Physical Exam General- Alert, Oriented, Affect-appropriate, Distress- none acute. Reading Skin- rash-none, lesions- none, excoriation- none Lymphadenopathy- none found Head- atraumatic,  Throat- mucosa clear , drainage- none, tonsils- atrophic Neck- flexible , trachea midline, no stridor , thyroid nl, carotid no bruit Chest - symmetrical excursion , unlabored, crackles R>L Heart/CV- RRR , no murmur , no gallop  , no rub, nl s1 s2 Abd- tender-not to light pressure, distended-no, bowel sounds-present, HSM- no Br/ Gen/ Rectal- Not done, not indicated Extrem- cyanosis- none, clubbing, none, atrophy- none, strength- nl Neuro- grossly intact to observation   PCXR 5/28:  Right> left airspace disease. unchanged  Lab 08/30/11 0415 08/28/11 0418 08/28/11 0032  NA 140 136 133*  K 3.9 3.4* 2.6*  CL 107 103 99  CO2 22 25 24   BUN 8 10 10   CREATININE 0.47* 0.57 0.60  GLUCOSE 130* 118* 87    Lab 08/30/11 0415 08/29/11 0441 08/28/11 0032  HGB 9.7* 10.4* 10.9*  HCT 29.0* 30.3* 31.9*  WBC 20.0* 16.3* 9.7  PLT 363 466* 369  ANA 5/26: negative Urine Legionella 5/26: negative HIV AB: NR PCT: <0.10  Assessment/Plan:  1) Nodular pulmonary infiltrates. ddx atypical pneumonia, ALI in  setting of bacterial translocation from colitis, pneumonitis, or  acute Histo pneumonia (recent trip to [place]).   Plan: -Cont current broad spectrum abx  -IV steroids currently 40mg  q 6. Changed per Dawn Frost on 5/27 -Bronchoscopy scheduled for 2pm on 08/31/11 .   2) addison's disease Plan: Currently on solumedrol, might consider solucortef if simply treating adrenals here.   3) Colitis, pred dependent Plan: -per IM  4)Pain: Plan: Per IM.    BABCOCK,PETE 08/30/2011, 1:50 PM    I have seen and examined this patient with the nurse practionner and agree with the above assessment and plan.    Shan Levans Beeper  [phone]  Cell  [phone]  If no response or cell goes to voicemail, call beeper [phone]

## 2011-08-31 ENCOUNTER — Encounter (HOSPITAL_COMMUNITY): Payer: Self-pay | Admitting: Radiology

## 2011-08-31 ENCOUNTER — Inpatient Hospital Stay (HOSPITAL_COMMUNITY): Payer: 59

## 2011-08-31 ENCOUNTER — Encounter (HOSPITAL_COMMUNITY)
Admission: EM | Disposition: A | Discharge status: Home / Self Care | Payer: Self-pay | Source: Home / Self Care | Attending: Internal Medicine

## 2011-08-31 DIAGNOSIS — J189 Pneumonia, unspecified organism: Secondary | ICD-10-CM

## 2011-08-31 DIAGNOSIS — E876 Hypokalemia: Secondary | ICD-10-CM

## 2011-08-31 DIAGNOSIS — R197 Diarrhea, unspecified: Secondary | ICD-10-CM

## 2011-08-31 DIAGNOSIS — J679 Hypersensitivity pneumonitis due to unspecified organic dust: Secondary | ICD-10-CM | POA: Diagnosis present

## 2011-08-31 DIAGNOSIS — R079 Chest pain, unspecified: Secondary | ICD-10-CM

## 2011-08-31 HISTORY — PX: VIDEO BRONCHOSCOPY: SHX5072

## 2011-08-31 LAB — CBC
Hemoglobin: 10.5 g/dL — ABNORMAL LOW (ref 12.0–15.0)
RBC: 3.07 MIL/uL — ABNORMAL LOW (ref 3.87–5.11)
WBC: 18.4 10*3/uL — ABNORMAL HIGH (ref 4.0–10.5)

## 2011-08-31 LAB — BODY FLUID CELL COUNT WITH DIFFERENTIAL
Lymphs, Fluid: 56 %
Monocyte-Macrophage-Serous Fluid: 0 % — ABNORMAL LOW (ref 50–90)

## 2011-08-31 LAB — BASIC METABOLIC PANEL
BUN: 9 mg/dL (ref 6–23)
Chloride: 102 mEq/L (ref 96–112)
Creatinine, Ser: 0.41 mg/dL — ABNORMAL LOW (ref 0.50–1.10)
GFR calc Af Amer: >90 mL/min (ref >90)
GFR calc non Af Amer: >90 mL/min (ref >90)
Glucose, Bld: 130 mg/dL — ABNORMAL HIGH (ref 70–99)

## 2011-08-31 LAB — MYCOPLASMA PNEUMONIAE ANTIBODY, IGM: Mycoplasma pneumo IgM: 3.1 Units (ref <9.0)

## 2011-08-31 SURGERY — BRONCHOSCOPY, WITH FLUOROSCOPY
Anesthesia: Moderate Sedation

## 2011-08-31 MED ORDER — LIDOCAINE HCL 2 % EX GEL
CUTANEOUS | Status: DC | PRN
  Administered 2011-08-31: 1

## 2011-08-31 MED ORDER — BUTAMBEN-TETRACAINE-BENZOCAINE 2-2-14 % EX AERO
1.0000 | INHALATION_SPRAY | Freq: Once | CUTANEOUS | Status: DC
  Filled 2011-08-31: qty 56

## 2011-08-31 MED ORDER — FENTANYL CITRATE 0.05 MG/ML IJ SOLN
INTRAMUSCULAR | Status: DC | PRN
  Administered 2011-08-31: 20 ug via INTRAVENOUS
  Administered 2011-08-31: 30 ug via INTRAVENOUS
  Administered 2011-08-31: 20 ug via INTRAVENOUS

## 2011-08-31 MED ORDER — SODIUM CHLORIDE 0.9 % IV SOLN
INTRAVENOUS | Status: AC
  Administered 2011-08-31 (×2): via INTRAVENOUS

## 2011-08-31 MED ORDER — OXYCODONE HCL 10 MG PO TB12
10.0000 mg | ORAL_TABLET | Freq: Two times a day (BID) | ORAL | Status: DC
  Administered 2011-08-31 – 2011-09-02 (×5): 10 mg via ORAL
  Filled 2011-08-31 (×5): qty 1

## 2011-08-31 MED ORDER — PHENYLEPHRINE HCL 0.25 % NA SOLN
1.0000 | Freq: Four times a day (QID) | NASAL | Status: DC | PRN
  Filled 2011-08-31: qty 15

## 2011-08-31 MED ORDER — HYDRALAZINE HCL 20 MG/ML IJ SOLN
10.0000 mg | Freq: Four times a day (QID) | INTRAMUSCULAR | Status: DC | PRN
  Administered 2011-08-31 – 2011-09-01 (×2): 10 mg via INTRAVENOUS
  Filled 2011-08-31 (×2): qty 1

## 2011-08-31 MED ORDER — LIDOCAINE HCL 2 % EX GEL
Freq: Once | CUTANEOUS | Status: DC
  Filled 2011-08-31: qty 5

## 2011-08-31 MED ORDER — LIDOCAINE HCL 1 % IJ SOLN
INTRAMUSCULAR | Status: DC | PRN
  Administered 2011-08-31: 6 mL

## 2011-08-31 MED ORDER — PHENYLEPHRINE HCL 0.25 % NA SOLN
NASAL | Status: DC | PRN
  Administered 2011-08-31: 2 via NASAL

## 2011-08-31 MED ORDER — MIDAZOLAM HCL 10 MG/2ML IJ SOLN
INTRAMUSCULAR | Status: DC | PRN
  Administered 2011-08-31 (×3): 3 mg via INTRAVENOUS

## 2011-08-31 MED ORDER — VANCOMYCIN HCL 1000 MG IV SOLR
750.0000 mg | Freq: Three times a day (TID) | INTRAVENOUS | Status: DC
  Administered 2011-08-31 – 2011-09-01 (×5): 750 mg via INTRAVENOUS
  Filled 2011-08-31 (×6): qty 750

## 2011-08-31 NOTE — ED Provider Notes (Signed)
History/physical exam/procedure(s) were performed by non-physician practitioner and as supervising physician I was immediately available for consultation/collaboration. I have reviewed all notes and am in agreement with care and plan.   Hilario Quarry, MD 08/31/11 [phone]

## 2011-08-31 NOTE — Progress Notes (Signed)
Subjective: Patient complaining of right shoulder pain, she is also complaining of shortness of breath especially with activity.  Objective: Vital signs in last 24 hours: Filed Vitals:   08/30/11 1421 08/30/11 1729 08/30/11 2115 08/31/11 0530  BP: 172/99 172/90 162/78 162/81  Pulse: 50 49 62 41  Temp: 97.9 F (36.6 C) 97.5 F (36.4 C) 97.6 F (36.4 C) 97.4 F (36.3 C)  TempSrc: Oral Oral Oral Oral  Resp: 18 16 16 16   Height:      Weight:      SpO2: 96% 99% 98% 96%   Weight change:   Intake/Output Summary (Last 24 hours) at 08/31/11 1010 Last data filed at 08/31/11 0800  Gross per 24 hour  Intake    120 ml  Output      0 ml  Net    120 ml    Physical Exam: General: Awake, Oriented, No acute distress. HEENT: EOMI. Neck: Supple CV: S1 and S2 Lungs: Clear to ascultation bilaterally Abdomen: Soft, Nontender, Nondistended, +bowel sounds. Ext: Good pulses. Trace edema.  Lab Results:  Basename 08/31/11 0355 08/30/11 0415  NA 139 140  K 3.6 3.9  CL 102 107  CO2 27 22  GLUCOSE 130* 130*  BUN 9 8  CREATININE 0.41* 0.47*  CALCIUM 8.4 8.5  MG -- --  PHOS -- --    Basename 08/30/11 0415  AST 12  ALT 11  ALKPHOS 89  BILITOT <0.1*  PROT 5.4*  ALBUMIN 2.3*   No results found for this basename: LIPASE:2,AMYLASE:2 in the last 72 hours  Basename 08/31/11 0355 08/30/11 0415 08/29/11 0441  WBC 18.4* 20.0* --  NEUTROABS -- -- 15.5*  HGB 10.5* 9.7* --  HCT 31.2* 29.0* --  MCV 101.6* 102.5* --  PLT 429* 363 --    Basename 08/30/11 1138  CKTOTAL 21  CKMB 2.1  CKMBINDEX --  TROPONINI <0.30   No components found with this basename: POCBNP:3 No results found for this basename: DDIMER:2 in the last 72 hours No results found for this basename: HGBA1C:2 in the last 72 hours No results found for this basename: CHOL:2,HDL:2,LDLCALC:2,TRIG:2,CHOLHDL:2,LDLDIRECT:2 in the last 72 hours No results found for this basename: TSH,T4TOTAL,FREET3,T3FREE,THYROIDAB in the last 72  hours No results found for this basename: VITAMINB12:2,FOLATE:2,FERRITIN:2,TIBC:2,IRON:2,RETICCTPCT:2 in the last 72 hours  Micro Results: Recent Results (from the past 240 hour(s))  CLOSTRIDIUM DIFFICILE BY PCR     Status: Normal   Collection Time   08/22/11  3:03 PM      Component Value Range Status Comment   C difficile by pcr NEGATIVE  NEGATIVE  Final   STOOL CULTURE     Status: Normal   Collection Time   08/22/11  3:45 PM      Component Value Range Status Comment   Specimen Description STOOL   Final    Special Requests NONE   Final    Culture     Final    Value: NO SALMONELLA, SHIGELLA, CAMPYLOBACTER, YERSINIA, OR E.COLI 0157:H7 ISOLATED   Report Status 08/26/2011 FINAL   Final   OVA AND PARASITE EXAMINATION     Status: Normal   Collection Time   08/22/11  3:45 PM      Component Value Range Status Comment   Specimen Description STOOL   Final    Special Requests NONE   Final    Ova and parasites NO OVA OR PARASITES SEEN   Final    Report Status 08/23/2011 FINAL   Final  Studies/Results: Dg Chest 2 View  08/29/2011  *RADIOLOGY REPORT*  Clinical Data: Shortness of breath. Multi focal pneumonia  CHEST - 2 VIEW  Comparison: Chest CT dated 08/27/2011 and chest x-ray dated 07/24/2011  Findings: The patient has extensive patchy pulmonary infiltrates primarily perihilar in distribution but involving all areas of both lungs.  No effusions.  Heart size and vascularity are normal.  No osseous abnormality.  IMPRESSION: Extensive patchy bilateral pulmonary infiltrates, essentially unchanged since prior study. I suspect this patient has an atypical infection.  Original Report Authenticated By: Gwynn Burly, M.D.   Dg Chest Port 1 View  08/30/2011  *RADIOLOGY REPORT*  Clinical Data: Shortness of breath, chest pain.  PORTABLE CHEST - 1 VIEW  Comparison: 08/29/2011  Findings: Patchy upper lobe airspace opacities persist, unchanged. Improvement in the infrahilar opacity since prior study.   Heart is normal size.  No effusions or acute bony abnormality.  IMPRESSION: Persistent right upper lobe airspace opacities with improvement in the right infrahilar opacity since prior study.  Original Report Authenticated By: Cyndie Chime, M.D.    Medications: I have reviewed the patient's current medications. Scheduled Meds:   . budesonide  9 mg Oral BH-q7a  . butamben-tetracaine-benzocaine  1 spray Topical Once  . ceFEPime (MAXIPIME) IV  1 g Intravenous Q8H  . enoxaparin  40 mg Subcutaneous Q24H  . levothyroxine  50 mcg Oral Daily  . lidocaine   Topical Once  . methylPREDNISolone (SOLU-MEDROL) injection  40 mg Intravenous Q6H  . metoCLOPramide  10 mg Oral Q6H  . mulitivitamin with minerals  1 tablet Oral Daily  . nicotine  14 mg Transdermal Daily  . nortriptyline  25 mg Oral QHS  . oxyCODONE  10 mg Oral Q12H  . saccharomyces boulardii  250 mg Oral BID  . sucralfate  1 g Oral QID  . vancomycin  750 mg Intravenous Q8H  . DISCONTD: vancomycin  750 mg Intravenous Q12H   Continuous Infusions:   . sodium chloride    . DISCONTD: sodium chloride 75 mL/hr at 08/31/11 0156   PRN Meds:.acetaminophen, acetaminophen, albuterol, alum & mag hydroxide-simeth, clonazePAM, ketorolac, methocarbamol, morphine injection, oxyCODONE, phenylephrine, promethazine, traMADol, traZODone  Assessment/Plan: Nodular pulmonary infiltrates/atypical pneumonia Pulmonary following continue broad-spectrum antibiotics, chest x-ray performed on 08/29/2011 shows persistent right upper lobe airspace opacities with improvement in right infrahilar opacity.  Continue vancomycin and cefepime.  Antibiotics since 08/28/2011.  Plan for bronchoscopy today.    Right shoulder pain Likely due to pulmonary infiltrates.  Chronic pain syndrome Continue current pain regimen at this time, try to taper the patient from IV narcotics in preparation for discharge over the next couple of days.  Make cautious adjustments in pain regimen  as the patient may have opiate dependence.  Leukocytosis Likely secondary to IV steroids/pneumonia.  Anemia Likely due to chronic disease.  Addison's disease Hydrocortisone at home currently on IV Solu-Medrol, transitioned to hydrocortisone likely tomorrow.  Anxiety and depression continue home medications.  Chest pain Likely due to pulmonary infiltrates.  Troponin negative x2.  History of collagenous colitis Stable.  Prophylaxis Lovenox.  Disposition Pending.   LOS: 4 days  Karuna Balducci A, MD 08/31/2011, 10:10 AM

## 2011-08-31 NOTE — Interval H&P Note (Signed)
I have seen and examined this pt and there have been no changes in the physical exam or history. The pt is ready for FOB Shan Levans [facility]  [phone]  Cell  [phone]  If no response or cell goes to voicemail, call beeper [phone]

## 2011-08-31 NOTE — Op Note (Signed)
Bronchoscopy Procedure Note  Date of Operation: 08/31/2011  Pre-op Diagnosis: Pulmonary infiltrates  Post-op Diagnosis: same  Surgeon: Shan Levans  Anesthesia: Monitored Local Anesthesia with Sedation Versed 9mg  IV  Fentanyl   Operation: Flexible fiberoptic bronchoscopy, diagnostic   Findings: Normal airway  Specimen: TBBX RLL, BAL RML  Estimated Blood Loss: Minimal  Complications: None  Indications and History: The patient is a 46 y.o. female with bilateral pulmonary infiltrates.  The risks, benefits, complications, treatment options and expected outcomes were discussed with the patient.  The possibilities of reaction to medication, pulmonary aspiration, perforation of a viscus, bleeding, failure to diagnose a condition and creating a complication requiring transfusion or operation were discussed with the patient who freely signed the consent.    Description of Procedure: The patient was re-examined in the bronchoscopy suite and the site of surgery properly noted/marked.  The patient was identified as Dawn Frost and the procedure verified as Flexible Fiberoptic Bronchoscopy.  A Time Out was held and the above information confirmed.   After the induction of topical nasopharyngeal anesthesia, the patient was positioned  and the bronchoscope was passed through the R nares. The vocal cords were visualized and  1% buffered lidocaine 5 ml was topically placed onto the cords. The cords were normal. The scope was then passed into the trachea.  1% buffered lidocaine 5 ml was used topically on the carina.  Careful inspection of the tracheal lumen was accomplished. The scope was sequentially passed into the left main and then left upper and lower bronchi and segmental bronchi.      The scope was then withdrawn and advanced into the right main bronchus and then into the RUL, RML, and RLL bronchi and segmental bronchi.   BAL RML 70cc return, 110cc infused, RLL TBBx   was done and  there was two  specimen.   Endobronchial findings: normal Trachea: Normal mucosa Carina: Normal mucosa Right main bronchus: Normal mucosa Right upper lobe bronchus: Normal mucosa Right upper lobe bronchus: Normal mucosa Right upper lobe bronchus: Normal mucosa Left main bronchus: Normal mucosa Left upper lobe bronchus: Normal mucosa Left lower lobe bronchus: Normal mucosa  The Patient was taken to the Endoscopy Recovery area in satisfactory condition.  Attestation: I performed the procedure.  Shan Levans Beeper  [phone]  Cell  [phone]  If no response or cell goes to voicemail, call beeper [phone]

## 2011-08-31 NOTE — Progress Notes (Signed)
ANTIBIOTIC CONSULT NOTE - FOLLOW UP  Pharmacy Consult for Vancomycin and Cefepime Indication: pneumonia  Allergies  Allergen Reactions  . Ambien (Zolpidem Tartrate) Other (See Comments)    headache  . Nsaids Other (See Comments)    Bad for colitis  . Zofran Other (See Comments)    Headache    Patient Measurements: Height: 5\' 4"  (162.6 cm) Weight: 130 lb 9.6 oz (59.24 kg) IBW/kg (Calculated) : 54.7   Vital Signs: Temp: 97.4 F (36.3 C) (05/29 0530) Temp src: Oral (05/29 0530) BP: 162/81 mmHg (05/29 0530) Pulse Rate: 41  (05/29 0530) Intake/Output from previous day: 05/28 0701 - 05/29 0700 In: 240 [P.O.:240] Out: -  Intake/Output from this shift:    Labs:  Basename 08/31/11 0355 08/30/11 0415 08/29/11 0441  WBC 18.4* 20.0* 16.3*  HGB 10.5* 9.7* 10.4*  PLT 429* 363 466*  LABCREA -- -- --  CREATININE 0.41* 0.47* --   Estimated Creatinine Clearance: 76.7 ml/min (by C-G formula based on Cr of 0.41).  Basename 08/31/11 0635  VANCOTROUGH 5.3*  VANCOPEAK --  Drue Dun --  GENTTROUGH --  GENTPEAK --  GENTRANDOM --  TOBRATROUGH --  TOBRAPEAK --  TOBRARND --  AMIKACINPEAK --  AMIKACINTROU --  AMIKACIN --     Microbiology: Recent Results (from the past 720 hour(s))  CLOSTRIDIUM DIFFICILE BY PCR     Status: Normal   Collection Time   08/07/11  5:22 PM      Component Value Range Status Comment   C difficile by pcr NEGATIVE  NEGATIVE  Final   STOOL CULTURE     Status: Normal   Collection Time   08/07/11  5:23 PM      Component Value Range Status Comment   Specimen Description STOOL   Final    Special Requests NONE   Final    Culture     Final    Value: NO SALMONELLA, SHIGELLA, CAMPYLOBACTER, YERSINIA, OR E.COLI 0157:H7 ISOLATED   Report Status 08/11/2011 FINAL   Final   CLOSTRIDIUM DIFFICILE BY PCR     Status: Normal   Collection Time   08/22/11  3:03 PM      Component Value Range Status Comment   C difficile by pcr NEGATIVE  NEGATIVE  Final   STOOL  CULTURE     Status: Normal   Collection Time   08/22/11  3:45 PM      Component Value Range Status Comment   Specimen Description STOOL   Final    Special Requests NONE   Final    Culture     Final    Value: NO SALMONELLA, SHIGELLA, CAMPYLOBACTER, YERSINIA, OR E.COLI 0157:H7 ISOLATED   Report Status 08/26/2011 FINAL   Final   OVA AND PARASITE EXAMINATION     Status: Normal   Collection Time   08/22/11  3:45 PM      Component Value Range Status Comment   Specimen Description STOOL   Final    Special Requests NONE   Final    Ova and parasites NO OVA OR PARASITES SEEN   Final    Report Status 08/23/2011 FINAL   Final     Anti-infectives     Start     Dose/Rate Route Frequency Ordered Stop   08/28/11 0800   vancomycin (VANCOCIN) 750 mg in sodium chloride 0.9 % 150 mL IVPB        750 mg 150 mL/hr over 60 Minutes Intravenous Every 12 hours 08/28/11 0040  08/28/11 0100   ceFEPIme (MAXIPIME) 1 g in dextrose 5 % 50 mL IVPB        1 g 100 mL/hr over 30 Minutes Intravenous Every 8 hours 08/28/11 0040     08/27/11 1915   vancomycin (VANCOCIN) IVPB 1000 mg/200 mL premix        1,000 mg 200 mL/hr over 60 Minutes Intravenous  Once 08/27/11 1902 08/27/11 2125   08/27/11 1915   piperacillin-tazobactam (ZOSYN) IVPB 3.375 g  Status:  Discontinued        3.375 g 12.5 mL/hr over 240 Minutes Intravenous  Once 08/27/11 1902 08/29/11 1259          Assessment:  46 yo F presented with abdominal pain and diarrhea.  CT suggests atypical pulmonary infection. Immunocompromised on chronic steroids.  Patient with recent hospitalization (08/07/11) for colitis.  Day #5 empiric Vanc and Cefepime.  Afebrile.  SCr wnl. CrCl >100N.  Pneumocystis smear ordered.  Vanc trough below goal.  Goal of Therapy:  Vancomycin trough level 15-20 mcg/ml  Plan:   Increase Vanc 750mg  from q12h to q8h.   Cont Cefepime 1g q8h.  F/u daily.   Charolotte Eke, PharmD, pager [phone]. 08/31/2011,7:37  AM.

## 2011-08-31 NOTE — Progress Notes (Signed)
Bronch w/ video intervention performed w/ bronchial biopsy intervention, and bronchial washing/BAL intervention.  I agree with above RT note Austin Miles  [phone]  Cell  [phone]  If no response or cell goes to voicemail, call beeper [phone]

## 2011-08-31 NOTE — H&P (View-Only) (Signed)
Reason for Consult: Pulmonary consult - nodular infiltrates Referring Physician: Triad Dr Abrol  Dawn Frost is an 46 y.o. female.  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 Long hx of collagenous colitis. 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 Normal CXR 4/13  Interval:   Still with cough and chest pain. No blood cults obtained.  Not able to produce sputum for PCP stain.  PCT level normal.   PHYS EXAM: Blood pressure 155/84, pulse 53, temperature 97.3 F (36.3 C), temperature source Oral, resp. rate 16, height 5' 4" (1.626 m), weight 59.24 kg (130 lb 9.6 oz), last menstrual period 08/12/2011, SpO2 96.00%. OBJ- Physical Exam General- Alert, Oriented, Affect-appropriate, Distress- none acute. Reading Skin- rash-none, lesions- none, excoriation- none Lymphadenopathy- none found Head- atraumatic,  Throat- mucosa clear , drainage- none, tonsils- atrophic Neck- flexible , trachea midline, no stridor , thyroid nl, carotid no bruit Chest - symmetrical excursion , unlabored, crackles R>L Heart/CV- RRR , no murmur , no gallop  , no rub, nl s1 s2 Abd- tender-not to light pressure, distended-no, bowel sounds-present, HSM- no Br/ Gen/ Rectal- Not done, not indicated Extrem- cyanosis- none, clubbing, none, atrophy- none, strength- nl Neuro- grossly intact to observation   PCXR 5/28:  Right> left airspace disease. unchanged  Lab 08/30/11 0415 08/28/11 0418 08/28/11 0032  NA 140 136 133*  K 3.9 3.4* 2.6*  CL 107 103 99  CO2 22 25 24  BUN 8 10 10  CREATININE 0.47* 0.57 0.60  GLUCOSE 130* 118* 87    Lab 08/30/11 0415 08/29/11 0441 08/28/11 0032  HGB 9.7* 10.4* 10.9*  HCT 29.0* 30.3* 31.9*  WBC 20.0* 16.3* 9.7  PLT 363 466* 369  ANA 5/26: negative Urine Legionella 5/26: negative HIV AB: NR PCT: <0.10  Assessment/Plan:  1) Nodular pulmonary infiltrates. ddx atypical pneumonia, ALI in  setting of bacterial translocation from colitis, pneumonitis, or  acute Histo pneumonia (recent trip to [place]).   Plan: -Cont current broad spectrum abx  -IV steroids currently 40mg q 6. Changed per Dr Young on 5/27 -Bronchoscopy scheduled for 2pm on 08/31/11 .   2) addison's disease Plan: Currently on solumedrol, might consider solucortef if simply treating adrenals here.   3) Colitis, pred dependent Plan: -per IM  4)Pain: Plan: Per IM.    BABCOCK,PETE 08/30/2011, 1:50 PM    I have seen and examined this patient with the nurse practionner and agree with the above assessment and plan.    Dawn Frost Beeper  [phone]  Cell  [phone]  If no response or cell goes to voicemail, call beeper [phone]     

## 2011-09-01 ENCOUNTER — Encounter (HOSPITAL_COMMUNITY): Payer: Self-pay | Admitting: Critical Care Medicine

## 2011-09-01 DIAGNOSIS — R197 Diarrhea, unspecified: Secondary | ICD-10-CM

## 2011-09-01 DIAGNOSIS — E876 Hypokalemia: Secondary | ICD-10-CM

## 2011-09-01 DIAGNOSIS — J209 Acute bronchitis, unspecified: Secondary | ICD-10-CM

## 2011-09-01 DIAGNOSIS — J189 Pneumonia, unspecified organism: Secondary | ICD-10-CM

## 2011-09-01 DIAGNOSIS — R079 Chest pain, unspecified: Secondary | ICD-10-CM

## 2011-09-01 LAB — PNEUMOCYSTIS JIROVECI SMEAR BY DFA: Pneumocystis jiroveci Ag: NEGATIVE

## 2011-09-01 MED ORDER — HYDROCORTISONE 10 MG PO TABS
10.0000 mg | ORAL_TABLET | Freq: Two times a day (BID) | ORAL | Status: DC
  Administered 2011-09-01: 10 mg via ORAL
  Filled 2011-09-01 (×3): qty 1

## 2011-09-01 MED ORDER — VANCOMYCIN HCL IN DEXTROSE 1-5 GM/200ML-% IV SOLN
1000.0000 mg | Freq: Three times a day (TID) | INTRAVENOUS | Status: DC
  Administered 2011-09-02 (×2): 1000 mg via INTRAVENOUS
  Filled 2011-09-01 (×4): qty 200

## 2011-09-01 MED ORDER — PREDNISONE 20 MG PO TABS
30.0000 mg | ORAL_TABLET | Freq: Two times a day (BID) | ORAL | Status: DC
  Administered 2011-09-01 – 2011-09-02 (×2): 30 mg via ORAL
  Filled 2011-09-01 (×5): qty 1

## 2011-09-01 NOTE — ED Provider Notes (Signed)
Medical screening examination/treatment/procedure(s) were performed by non-physician practitioner and as supervising physician I was immediately available for consultation/collaboration.   Paulette Lynch M Arlis Everly, MD 09/01/11 0110 

## 2011-09-01 NOTE — Progress Notes (Addendum)
Reason for Consult: Pulmonary consult - nodular infiltrates Referring Physician: Triad Dr Darcella Gasman is an 46 y.o. female.  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 Long hx of collagenous colitis. 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 Normal CXR 4/13  Interval:  Cough is better.  On hydrocortisone for adrenal insufficiency.  PHYS EXAM: Blood pressure 102/65, pulse 89, temperature 98.2 F (36.8 C), temperature source Oral, resp. rate 16, height 5\' 4"  (1.626 m), weight 58.968 kg (130 lb), last menstrual period 08/12/2011, SpO2 99.00%. OBJ- Physical Exam General- Alert, Oriented, Affect-appropriate, Distress- no acute  Skin- rash-none, lesions- none, excoriation- none Lymphadenopathy- none found Head- atraumatic,  Throat- mucosa clear , drainage- none, tonsils- atrophic Neck- flexible , trachea midline, no stridor , thyroid nl, carotid no bruit Chest - symmetrical excursion , unlabored, decreased  crackles R>L Heart/CV- RRR , no murmur , no gallop  , no rub, nl s1 s2 Abd- tender-not to light pressure, distended-no, bowel sounds-present, HSM- no Br/ Gen/ Rectal- Not done, not indicated Extrem- cyanosis- none, clubbing, none, atrophy- none, strength- nl Neuro- grossly intact to observation   CXR none  Lab 08/31/11 0355 08/30/11 0415 08/28/11 0418  NA 139 140 136  K 3.6 3.9 3.4*  CL 102 107 103  CO2 27 22 25   BUN 9 8 10   CREATININE 0.41* 0.47* 0.57  GLUCOSE 130* 130* 118*    Lab 08/31/11 0355 08/30/11 0415 08/29/11 0441  HGB 10.5* 9.7* 10.4*  HCT 31.2* 29.0* 30.3*  WBC 18.4* 20.0* 16.3*  PLT 429* 363 466*  ANA 5/26: negative Urine Legionella 5/26: negative HIV AB: NR PCT: <0.10 Pneumocystis stain neg    Assessment/Plan:  1) Nodular pulmonary infiltrates. ddx atypical pneumonia, ALI in setting of bacterial translocation from colitis, pneumonitis,  S/p FOB 5/29 with  BAL 19% Eos on differnential  No system eosinophilia.  ?hypersensitivity pneumonitis?  No real exposure hx elicited.  Plan: -consider d/c of Broad spectrum ABX, all c/s are neg -change IV MEDROL to po prednisone 30mg  bid and d/c po hydrocortisone -f/u fob TBBX results .   2) addison's disease Plan: D/c hydrocortisone Start prednisone   3) Colitis, pred dependent Plan: -per IM  4)Pain: Plan: Per IM.   Note this pt has been in ED 16 times in 6 months!! Along with three admits!!  Shan Levans 09/01/2011, 3:31 PM    Beeper  [phone]  Cell  [phone]  If no response or cell goes to voicemail, call beeper [phone]

## 2011-09-01 NOTE — Progress Notes (Signed)
ANTIBIOTIC CONSULT NOTE - FOLLOW UP  Pharmacy Consult for Vancomycin Indication: pneumonia  Allergies  Allergen Reactions  . Ambien (Zolpidem Tartrate) Other (See Comments)    headache  . Nsaids Other (See Comments)    Bad for colitis  . Zofran Other (See Comments)    Headache    Patient Measurements: Height: 5\' 4"  (162.6 cm) Weight: 130 lb (58.968 kg) IBW/kg (Calculated) : 54.7   Vital Signs: Temp: 98.2 F (36.8 C) (05/30 1230) Temp src: Oral (05/30 1230) BP: 102/65 mmHg (05/30 1230) Pulse Rate: 89  (05/30 1230) Intake/Output from previous day: 05/29 0701 - 05/30 0700 In: 640 [P.O.:640] Out: -  Intake/Output from this shift: Total I/O In: 240 [P.O.:240] Out: -   Labs:  Basename 08/31/11 0355 08/30/11 0415  WBC 18.4* 20.0*  HGB 10.5* 9.7*  PLT 429* 363  LABCREA -- --  CREATININE 0.41* 0.47*   Estimated Creatinine Clearance: 76.7 ml/min (by C-G formula based on Cr of 0.41).  Basename 09/01/11 1517 08/31/11 0635  VANCOTROUGH 12.5 5.3*  VANCOPEAK -- --  Drue Dun -- --  GENTTROUGH -- --  GENTPEAK -- --  GENTRANDOM -- --  TOBRATROUGH -- --  TOBRAPEAK -- --  TOBRARND -- --  AMIKACINPEAK -- --  AMIKACINTROU -- --  AMIKACIN -- --     Microbiology: Recent Results (from the past 720 hour(s))  CLOSTRIDIUM DIFFICILE BY PCR     Status: Normal   Collection Time   08/07/11  5:22 PM      Component Value Range Status Comment   C difficile by pcr NEGATIVE  NEGATIVE  Final   STOOL CULTURE     Status: Normal   Collection Time   08/07/11  5:23 PM      Component Value Range Status Comment   Specimen Description STOOL   Final    Special Requests NONE   Final    Culture     Final    Value: NO SALMONELLA, SHIGELLA, CAMPYLOBACTER, YERSINIA, OR E.COLI 0157:H7 ISOLATED   Report Status 08/11/2011 FINAL   Final   CLOSTRIDIUM DIFFICILE BY PCR     Status: Normal   Collection Time   08/22/11  3:03 PM      Component Value Range Status Comment   C difficile by pcr NEGATIVE   NEGATIVE  Final   STOOL CULTURE     Status: Normal   Collection Time   08/22/11  3:45 PM      Component Value Range Status Comment   Specimen Description STOOL   Final    Special Requests NONE   Final    Culture     Final    Value: NO SALMONELLA, SHIGELLA, CAMPYLOBACTER, YERSINIA, OR E.COLI 0157:H7 ISOLATED   Report Status 08/26/2011 FINAL   Final   OVA AND PARASITE EXAMINATION     Status: Normal   Collection Time   08/22/11  3:45 PM      Component Value Range Status Comment   Specimen Description STOOL   Final    Special Requests NONE   Final    Ova and parasites NO OVA OR PARASITES SEEN   Final    Report Status 08/23/2011 FINAL   Final   FUNGUS CULTURE W SMEAR     Status: Normal (Preliminary result)   Collection Time   08/31/11  2:59 PM      Component Value Range Status Comment   Specimen Description BRONCHIAL ALVEOLAR LAVAGE   Final    Special Requests Normal  Final    Fungal Smear NO YEAST OR FUNGAL ELEMENTS SEEN   Final    Culture CULTURE IN PROGRESS FOR FOUR WEEKS   Final    Report Status PENDING   Incomplete   PNEUMOCYSTIS JIROVECI SMEAR BY DFA     Status: Normal   Collection Time   08/31/11  2:59 PM      Component Value Range Status Comment   Specimen Source-[facility] BRONCHIAL ALVEOLAR LAVAGE   Final    Pneumocystis jiroveci Ag NEGATIVE   Final Performed at [facility]    Assessment:  46 yo F presented with abdominal pain and diarrhea.  CT suggests atypical pulmonary infection. Immunocompromised on chronic steroids.  Patient with recent hospitalization (08/07/11) for colitis.  Day #6 empiric Vanc and Cefepime.  Afebrile.  SCr wnl. CrCl >100N.  Pneumocystis smear negative. AFB, legionella, fungal, and CMV pending and no growth to date.  Note, the patient is not being covered for atypical organisms with current antibiotics.  Vanc trough below goal.  Goal of Therapy:  Vancomycin trough level 15-20 mcg/ml  Plan:   Increase Vancomycin to 1000 mg  to q8h.   Cont Cefepime 1g q8h.  F/u LOT.  Clance Boll, PharmD, BCPS Pager: [phone] 09/01/2011 4:26 PM

## 2011-09-01 NOTE — Progress Notes (Signed)
Subjective: Right shoulder pain improved today. Feeling slightly better today than yesterday.  Objective: Vital signs in last 24 hours: Filed Vitals:   08/31/11 2130 09/01/11 0145 09/01/11 0555 09/01/11 0838  BP: 102/69 121/73 164/83 117/78  Pulse: 79 52 79 70  Temp: 97.4 F (36.3 C) 98 F (36.7 C) 97.9 F (36.6 C)   TempSrc: Oral Oral Oral   Resp: 16 15 15    Height:      Weight:      SpO2: 96% 96% 96%    Weight change:   Intake/Output Summary (Last 24 hours) at 09/01/11 0940 Last data filed at 08/31/11 2100  Gross per 24 hour  Intake    640 ml  Output      0 ml  Net    640 ml    Physical Exam: General: Awake, Oriented, No acute distress. HEENT: EOMI. Neck: Supple CV: S1 and S2 Lungs: Clear to ascultation bilaterally Abdomen: Soft, Nontender, Nondistended, +bowel sounds. Ext: Good pulses. Trace edema.  Lab Results:  Basename 08/31/11 0355 08/30/11 0415  NA 139 140  K 3.6 3.9  CL 102 107  CO2 27 22  GLUCOSE 130* 130*  BUN 9 8  CREATININE 0.41* 0.47*  CALCIUM 8.4 8.5  MG -- --  PHOS -- --    Basename 08/30/11 0415  AST 12  ALT 11  ALKPHOS 89  BILITOT <0.1*  PROT 5.4*  ALBUMIN 2.3*   No results found for this basename: LIPASE:2,AMYLASE:2 in the last 72 hours  Basename 08/31/11 0355 08/30/11 0415  WBC 18.4* 20.0*  NEUTROABS -- --  HGB 10.5* 9.7*  HCT 31.2* 29.0*  MCV 101.6* 102.5*  PLT 429* 363    Basename 08/30/11 1138  CKTOTAL 21  CKMB 2.1  CKMBINDEX --  TROPONINI <0.30   No components found with this basename: POCBNP:3 No results found for this basename: DDIMER:2 in the last 72 hours No results found for this basename: HGBA1C:2 in the last 72 hours No results found for this basename: CHOL:2,HDL:2,LDLCALC:2,TRIG:2,CHOLHDL:2,LDLDIRECT:2 in the last 72 hours No results found for this basename: TSH,T4TOTAL,FREET3,T3FREE,THYROIDAB in the last 72 hours No results found for this basename:  VITAMINB12:2,FOLATE:2,FERRITIN:2,TIBC:2,IRON:2,RETICCTPCT:2 in the last 72 hours  Micro Results: Recent Results (from the past 240 hour(s))  CLOSTRIDIUM DIFFICILE BY PCR     Status: Normal   Collection Time   08/22/11  3:03 PM      Component Value Range Status Comment   C difficile by pcr NEGATIVE  NEGATIVE  Final   STOOL CULTURE     Status: Normal   Collection Time   08/22/11  3:45 PM      Component Value Range Status Comment   Specimen Description STOOL   Final    Special Requests NONE   Final    Culture     Final    Value: NO SALMONELLA, SHIGELLA, CAMPYLOBACTER, YERSINIA, OR E.COLI 0157:H7 ISOLATED   Report Status 08/26/2011 FINAL   Final   OVA AND PARASITE EXAMINATION     Status: Normal   Collection Time   08/22/11  3:45 PM      Component Value Range Status Comment   Specimen Description STOOL   Final    Special Requests NONE   Final    Ova and parasites NO OVA OR PARASITES SEEN   Final    Report Status 08/23/2011 FINAL   Final   PNEUMOCYSTIS JIROVECI SMEAR BY DFA     Status: Normal   Collection Time   08/31/11  2:59 PM      Component Value Range Status Comment   Specimen Source-PJSRC BRONCHIAL ALVEOLAR LAVAGE   Final    Pneumocystis jiroveci Ag NEGATIVE   Final Performed at [facility]    Studies/Results: Dg Chest Port 1 View  08/30/2011  *RADIOLOGY REPORT*  Clinical Data: Shortness of breath, chest pain.  PORTABLE CHEST - 1 VIEW  Comparison: 08/29/2011  Findings: Patchy upper lobe airspace opacities persist, unchanged. Improvement in the infrahilar opacity since prior study.  Heart is normal size.  No effusions or acute bony abnormality.  IMPRESSION: Persistent right upper lobe airspace opacities with improvement in the right infrahilar opacity since prior study.  Original Report Authenticated By: Cyndie Chime, M.D.   Dg C-arm Bronchoscopy  08/31/2011  CLINICAL DATA: bronch   C-ARM BRONCHOSCOPY  Fluoroscopy was utilized by the requesting physician.  No  radiographic  interpretation.      Medications: I have reviewed the patient's current medications. Scheduled Meds:    . budesonide  9 mg Oral [id number]  . ceFEPime (MAXIPIME) IV  1 g Intravenous Q8H  . enoxaparin  40 mg Subcutaneous Q24H  . hydrocortisone  10 mg Oral BID  . levothyroxine  50 mcg Oral Daily  . metoCLOPramide  10 mg Oral Q6H  . mulitivitamin with minerals  1 tablet Oral Daily  . nicotine  14 mg Transdermal Daily  . nortriptyline  25 mg Oral QHS  . oxyCODONE  10 mg Oral Q12H  . saccharomyces boulardii  250 mg Oral BID  . sucralfate  1 g Oral QID  . vancomycin  750 mg Intravenous Q8H  . DISCONTD: butamben-tetracaine-benzocaine  1 spray Topical Once  . DISCONTD: lidocaine   Topical Once  . DISCONTD: methylPREDNISolone (SOLU-MEDROL) injection  40 mg Intravenous Q6H   Continuous Infusions:    . sodium chloride 15 mL/hr at 08/31/11 1651  . DISCONTD: sodium chloride 75 mL/hr at 08/31/11 0156   PRN Meds:.acetaminophen, acetaminophen, albuterol, alum & mag hydroxide-simeth, clonazePAM, hydrALAZINE, ketorolac, methocarbamol, morphine injection, oxyCODONE, promethazine, traMADol, traZODone, DISCONTD: fentaNYL, DISCONTD: lidocaine, DISCONTD: lidocaine, DISCONTD: midazolam, DISCONTD: phenylephrine, DISCONTD: phenylephrine  Assessment/Plan: Nodular pulmonary infiltrates/atypical pneumonia Continue broad-spectrum antibiotics, chest x-ray performed on 08/29/2011 shows persistent right upper lobe airspace opacities with improvement in right infrahilar opacity.  Continue vancomycin and cefepime.  Antibiotics since 08/28/2011.  Pulmonary following and had bronchoscopy on 08/31/2011, pneumocystis smear by DFA was negative, AFB, legionella, fungal, and CMV pending and no growth to date.  Right shoulder pain Likely due to pulmonary infiltrates. Improved.  Chronic pain syndrome Continue current pain regimen at this time.  Make cautious adjustments in pain regimen as the patient may have  opiate dependence.  Leukocytosis Likely secondary to IV steroids/pneumonia.  Anemia Likely due to chronic disease.  Addison's disease Transition back to home hydrocortisone dose, discontinue IV Solu-Medrol.  Anxiety and depression continue home medications.  Chest pain Likely due to pulmonary infiltrates.  Troponin negative x2.  History of collagenous colitis Stable.  Prophylaxis Lovenox.  Disposition Pending. Consider discharge in next 24 hours.   LOS: 5 days  Harlow Carrizales A, MD 09/01/2011, 9:40 AM

## 2011-09-02 ENCOUNTER — Inpatient Hospital Stay (HOSPITAL_COMMUNITY): Payer: 59

## 2011-09-02 LAB — CBC
HCT: 34.3 % — ABNORMAL LOW (ref 36.0–46.0)
MCH: 33.8 pg (ref 26.0–34.0)
MCHC: 33.8 g/dL (ref 30.0–36.0)
MCV: 100 fL (ref 78.0–100.0)
RDW: 12.7 % (ref 11.5–15.5)

## 2011-09-02 LAB — BASIC METABOLIC PANEL
BUN: 17 mg/dL (ref 6–23)
Creatinine, Ser: 0.55 mg/dL (ref 0.50–1.10)
GFR calc Af Amer: >90 mL/min (ref >90)
GFR calc non Af Amer: >90 mL/min (ref >90)

## 2011-09-02 MED ORDER — OXYCODONE HCL 5 MG PO TABS: 5.0000 mg | ORAL_TABLET | ORAL | Status: AC | PRN

## 2011-09-02 MED ORDER — UNABLE TO FIND: Status: DC

## 2011-09-02 MED ORDER — POTASSIUM CHLORIDE CRYS ER 20 MEQ PO TBCR
40.0000 meq | EXTENDED_RELEASE_TABLET | Freq: Once | ORAL | Status: AC
  Administered 2011-09-02: 40 meq via ORAL
  Filled 2011-09-02: qty 2

## 2011-09-02 MED ORDER — PREDNISONE 20 MG PO TABS: 20.0000 mg | ORAL_TABLET | Freq: Two times a day (BID) | ORAL | Status: DC

## 2011-09-02 MED ORDER — PROMETHAZINE HCL 12.5 MG PO TABS: 12.5000 mg | ORAL_TABLET | Freq: Four times a day (QID) | ORAL | Status: AC | PRN

## 2011-09-02 NOTE — Progress Notes (Signed)
Pt discharged home in stable condition. Discharge instructions and scripts given. Pt verbalized understanding 

## 2011-09-02 NOTE — Discharge Summary (Signed)
Discharge Summary  Dawn Frost MR#: [id number]  DOB:Sep 22, 1965  Date of Admission: 08/27/2011 Date of Discharge: 09/02/2011  Patient's PCP: Dawn Quam, MD, MD  Attending Physician:Verbon Giangregorio A  Consults: Dr. Delford Field, Pulmonary  Discharge Diagnoses: Active Problems:  Adrenal insufficiency  Hypokalemia  Atypical pneumonia  Chronic diarrhea  Collagenous colitis  Chest pain  Postinflammatory pulmonary fibrosis   Brief Admitting History and Physical Dawn Frost is an 46 y.o. female with multiple medical problems who went to [facility] ED on 08/28/2011 ue to complaints of worsening fatigue and weakness, fevers and chills over the past 10 days.   Discharge Medications Medication List  As of 09/02/2011 10:35 AM   STOP taking these medications         budesonide 3 MG 24 hr capsule      hydrocortisone 10 MG tablet      traMADol 50 MG tablet         TAKE these medications         clonazePAM 1 MG tablet   Commonly known as: KLONOPIN   Take 1 mg by mouth 4 (four) times daily as needed. anxiety      levothyroxine 50 MCG tablet   Commonly known as: SYNTHROID, LEVOTHROID   Take 50 mcg by mouth daily.      metoCLOPramide 10 MG tablet   Commonly known as: REGLAN   Take 1 tablet (10 mg total) by mouth every 6 (six) hours.      mulitivitamin with minerals Tabs   Take 1 tablet by mouth daily.      nortriptyline 25 MG capsule   Commonly known as: PAMELOR   Take 25 mg by mouth at bedtime.      oxyCODONE 5 MG immediate release tablet   Commonly known as: Oxy IR/ROXICODONE   Take 1 tablet (5 mg total) by mouth every 4 (four) hours as needed for pain.      potassium chloride SA 20 MEQ tablet   Commonly known as: K-DUR,KLOR-CON   Take 2 tablets (40 mEq total) by mouth 2 (two) times daily.      predniSONE 20 MG tablet   Commonly known as: DELTASONE   Take 1 tablet (20 mg total) by mouth 2 (two) times daily with a meal.      promethazine 12.5 MG  tablet   Commonly known as: PHENERGAN   Take 1 tablet (12.5 mg total) by mouth every 6 (six) hours as needed for nausea.      ranitidine 150 MG capsule   Commonly known as: ZANTAC   Take 1 capsule (150 mg total) by mouth daily.      sucralfate 1 GM/10ML suspension   Commonly known as: CARAFATE   Take 10 mLs (1 g total) by mouth 4 (four) times daily.      UNABLE TO FIND   Please excuse [name] from any work obligations from 08/28/2011 till 09/04/2011 as the patient was hospitalized. Patient to return to work without any restrictions on 09/05/2011.            Hospital Course: Nodular pulmonary infiltrates/atypical pneumonia Initally was started on broad-spectrum antibiotics, chest x-ray performed on 08/29/2011 shows persistent right upper lobe airspace opacities with improvement in right infrahilar opacity.  Chest x-ray on Sep 02 2011 showed patchy streaky infiltrate within the right lung extending into the right upper lobe improved from previous study.  Patient had bronchoscopy on 08/31/2011. Pulmonary Dr. Delford Field followed the patient during the hospital stay. Patient completed  6 day empiric vancomycin and cefepime patient was not given any further antibiotics at discharge for home use given infectious workup negative, suspect is likely hypersensitivity reaction/pneumonitis.  Pneumocystis smear by DFA was negative, AFB negative, legionella negative, fungal negative, cultures still pending. Rheumatoid factor and IgE normal.  Sedimentation rate mildly elevated at 27. HIV nonreactive.  ANA and ANCA negative. TSH Normal. CMV pending.  Hypersensitivity pneumonitis profile pending.    Pathology from bronchoscopy pending.  Patient to followup with Dr. Delford Field as outpatient.  Continue prednisone 20 mg twice daily at discharge until patient sees Dr. Delford Field, at which time Dr. Delford Field can taper her steroids.  Right shoulder pain Likely due to pulmonary infiltrates. Improved.  Chronic pain syndrome Continue  current pain regimen at this time.  Discharge the patient only on oxycodone 5 mg every 4 hours as needed, 20 tablets prescribed.   Leukocytosis Likely secondary to steroids.  Anemia Likely due to chronic disease.  Addison's disease Continue prednisone for now.  Discontinue home hydrocortisone.  Anxiety and depression continue home medications.  Chest pain Likely due to pulmonary infiltrates.  Troponin negative x2.  History of collagenous colitis with chronic diarrhea with chronic abdominal pain Secondary to microscopic colitis.  Stable.  Discontinued budesonide (entocort), as patient is on oral prednisone.  Follow up with her gastroenterologist and primary care physician.  Hypothyroidism Continue levothyroxine. TSH normal at 0.396 on 08/28/2011, further management as outpatient.  Day of Discharge BP 151/57  Pulse 56  Temp(Src) 98 F (36.7 C) (Oral)  Resp 15  Ht 5\' 4"  (1.626 m)  Wt 58.968 kg (130 lb)  BMI 22.31 kg/m2  SpO2 96%  LMP 08/12/2011  Results for orders placed during the hospital encounter of 08/27/11 (from the past 48 hour(s))  FUNGUS CULTURE W SMEAR     Status: Normal (Preliminary result)   Collection Time   08/31/11  2:59 PM      Component Value Range Comment   Specimen Description BRONCHIAL ALVEOLAR LAVAGE      Special Requests Normal      Fungal Smear NO YEAST OR FUNGAL ELEMENTS SEEN      Culture CULTURE IN PROGRESS FOR FOUR WEEKS      Report Status PENDING     LEGIONELLA CULTURE     Status: Normal (Preliminary result)   Collection Time   08/31/11  2:59 PM      Component Value Range Comment   Specimen Description BRONCHIAL ALVEOLAR LAVAGE      Special Requests Normal      Culture        Value: NO LEGIONELLA ISOLATED, CULTURE IN PROGRESS FOR 5 DAYS   Report Status PENDING     PNEUMOCYSTIS JIROVECI SMEAR BY DFA     Status: Normal   Collection Time   08/31/11  2:59 PM      Component Value Range Comment   Specimen Source-PJSRC BRONCHIAL ALVEOLAR LAVAGE       Pneumocystis jiroveci Ag NEGATIVE   Performed at [facility]  BODY FLUID CELL COUNT WITH DIFFERENTIAL     Status: Abnormal   Collection Time   08/31/11  2:59 PM      Component Value Range Comment   Fluid Type-FCT BRONCHIAL ALVEOLAR LAVAGE   CORRECTED ON 05/29 AT 1520: PREVIOUSLY REPORTED AS Body Fluid   Color, Fluid COLORLESS (*) YELLOW     Appearance, Fluid HAZY (*) CLEAR     WBC, Fluid 242  0 - 1000 (cu mm)  Neutrophil Count, Fluid 25  0 - 25 (%)    Lymphs, Fluid 56      Monocyte-Macrophage-Serous Fluid 0 (*) 50 - 90 (%)    Eos, Fluid 19     AFB CULTURE WITH SMEAR     Status: Normal (Preliminary result)   Collection Time   08/31/11  3:15 PM      Component Value Range Comment   Specimen Description BRONCHIAL ALVEOLAR LAVAGE      Special Requests Normal      ACID FAST SMEAR NO ACID FAST BACILLI SEEN      Culture        Value: CULTURE WILL BE EXAMINED FOR 6 WEEKS BEFORE ISSUING A FINAL REPORT   Report Status PENDING     VANCOMYCIN, TROUGH     Status: Normal   Collection Time   09/01/11  3:17 PM      Component Value Range Comment   Vancomycin Tr 12.5  10.0 - 20.0 (ug/mL)   RHEUMATOID FACTOR     Status: Normal   Collection Time   09/01/11  4:27 PM      Component Value Range Comment   Rheumatoid Factor <10  <=14 (IU/mL)   IGE     Status: Normal   Collection Time   09/01/11  4:27 PM      Component Value Range Comment   IgE (Immunoglobulin E), Serum 70.9  0.0 - 180.0 (IU/mL)   SEDIMENTATION RATE     Status: Abnormal   Collection Time   09/01/11  4:27 PM      Component Value Range Comment   Sed Rate 27 (*) 0 - 22 (mm/hr)   CBC     Status: Abnormal   Collection Time   09/02/11  3:56 AM      Component Value Range Comment   WBC 17.1 (*) 4.0 - 10.5 (K/uL)    RBC 3.43 (*) 3.87 - 5.11 (MIL/uL)    Hemoglobin 11.6 (*) 12.0 - 15.0 (g/dL)    HCT 84.6 (*) 96.2 - 46.0 (%)    MCV 100.0  78.0 - 100.0 (fL)    MCH 33.8  26.0 - 34.0 (pg)    MCHC 33.8  30.0 - 36.0 (g/dL)      RDW 95.2  84.1 - 15.5 (%)    Platelets 424 (*) 150 - 400 (K/uL)   BASIC METABOLIC PANEL     Status: Abnormal   Collection Time   09/02/11  3:56 AM      Component Value Range Comment   Sodium 136  135 - 145 (mEq/L)    Potassium 3.0 (*) 3.5 - 5.1 (mEq/L)    Chloride 99  96 - 112 (mEq/L)    CO2 28  19 - 32 (mEq/L)    Glucose, Bld 102 (*) 70 - 99 (mg/dL)    BUN 17  6 - 23 (mg/dL)    Creatinine, Ser 3.24  0.50 - 1.10 (mg/dL)    Calcium 8.2 (*) 8.4 - 10.5 (mg/dL)    GFR calc non Af Amer >90  >90 (mL/min)    GFR calc Af Amer >90  >90 (mL/min)     Dg Chest 2 View  09/02/2011  *RADIOLOGY REPORT*  Clinical Data: History of pulmonary infiltrates.  Follow-up. History of shortness of breath and chest pain.  History of smoking.  CHEST - 2 VIEW  Comparison: 08/29/2011.  08/30/2011.CT 08/27/2011.  Findings: Cardiac silhouette is normal size and shape.  Patchy streaky infiltrative densities are seen within  the right lung extending into the right upper lobe.  This appears improved since previous study.  Streaky infiltrative densities are also seen in the left upper lobe with improvement since prior study.  There is minimal atelectasis in the right medial base.  No pleural effusion is seen.  Nodularity on the PA image in the right base is unchanged.  Right paratracheal opacity is unchanged and is felt to be vascular.  There is minimal degenerative spondylosis.  IMPRESSION:  Patchy streaky infiltrative densities are seen within the right lung extending into the right upper lobe.  This appears improved since previous study.  Streaky infiltrative densities are also seen in the left upper lobe with improvement since prior study.  There is minimal atelectasis in the right medial base. Small nodular area in right base appears unchanged from previous studies.  Original Report Authenticated By: Crawford Givens, M.D.   Dg Chest 2 View  08/29/2011  *RADIOLOGY REPORT*  Clinical Data: Shortness of breath. Multi focal pneumonia   CHEST - 2 VIEW  Comparison: Chest CT dated 08/27/2011 and chest x-ray dated 07/24/2011  Findings: The patient has extensive patchy pulmonary infiltrates primarily perihilar in distribution but involving all areas of both lungs.  No effusions.  Heart size and vascularity are normal.  No osseous abnormality.  IMPRESSION: Extensive patchy bilateral pulmonary infiltrates, essentially unchanged since prior study. I suspect this patient has an atypical infection.  Original Report Authenticated By: Gwynn Burly, M.D.   Ct Chest W Contrast  08/27/2011  *RADIOLOGY REPORT*  Clinical Data: Abnormality of the lung bases on recent CT abdomen.  CT CHEST WITH CONTRAST  Technique:  Multidetector CT imaging of the chest was performed following the standard protocol during bolus administration of intravenous contrast.  Contrast: OMNIPAQUE IOHEXOL 350 MG/ML SOLN  Comparison: CT chest dated 12/26/2010  Findings: Multifocal central patchy/ground-glass opacities throughout all lobes.  Additional paramediastinal opacities in the upper lobes with possible air bronchograms (series 3/image 16) and/or fibrosis.  Additional peripheral patchy/nodular opacities scattered throughout all lobes, measuring up to 11 mm at the right lung base (series 3/image 46).  When compared to the prior CT chest, these findings are new.  No underlying chronic interstitial lung disease.  Differential considerations include atypical infection (including Pneumocystis pneumonia in the setting of immunosuppression/HIV), inflammatory disease (such as hypersensitivity pneumonitis or drug toxicity), or possibly interstitial lung disease related to underlying collagen vascular disease or vasculitis.  The appearance does not suggest bacterial/lobar pneumonia.  Given the associated patchy opacities, metastatic disease and septic emboli are considered unlikely.  Visualized thyroid is unremarkable.  The heart is normal in size.  No pericardial effusion.  Small  right paratracheal/mediastinal/right hilar lymph nodes measuring up to 10 mm short axis, possibly reactive. No suspicious axillary lymphadenopathy.  Visualized upper abdomen is unremarkable.  Mild degenerative changes of the visualized thoracolumbar spine.  IMPRESSION: Multifocal patchy/nodular opacities throughout all lobes, as described above, new from 2012 CT chest.  Primary differential considerations include atypical infection or inflammatory disease, as described above.  Metastatic disease or septic emboli are considered unlikely.  Original Report Authenticated By: Charline Bills, M.D.   Ct Abdomen Pelvis W Contrast  08/27/2011  *RADIOLOGY REPORT*  Clinical Data: Abdominal pain.  History of colitis.  White count 9.80.  Nausea, vomiting, diarrhea.  Chronic back pain.  CT ABDOMEN AND PELVIS WITH CONTRAST  Technique:  Multidetector CT imaging of the abdomen and pelvis was performed following the standard protocol during bolus administration of intravenous contrast.  Contrast: OMNIPAQUE IOHEXOL 300 MG/ML  SOLN  Comparison: 06/07/2011  Findings: The lung bases are markedly abnormal.  Multiple pulmonary nodules have developed since prior study.  These vary in size from approximately 1 mm to 1.6 cm.  Nodules are circumscribed and irregular.  No pleural effusions are identified.  No focal abnormality identified in the liver, spleen, pancreas, adrenal glands, or kidneys. The appendix is well seen and has a normal appearance.  Gallbladder is absent.  The stomach and small bowel loops have a normal appearance. Colonic loops are normal in appearance. The uterus is present.  No evidence for adnexal mass.  Patient has had bilateral tubal ligation.  No free pelvic fluid or pelvic adenopathy.  IMPRESSION:  1.  Markedly abnormal lung bases with multiple large and small pulmonary masses.  Differential diagnosis includes inflammatory/infectious process, metastases, other new process, septic emboli.  Recommend further  evaluation with CT of the chest with contrast to evaluate for adenopathy in further characterization of these nodules. 2.  No evidence for acute appendicitis or colitis. 3.  Status post bilateral tubal ligation.  Original Report Authenticated By: Patterson Hammersmith, M.D.   Dg Chest Port 1 View  08/30/2011  *RADIOLOGY REPORT*  Clinical Data: Shortness of breath, chest pain.  PORTABLE CHEST - 1 VIEW  Comparison: 08/29/2011  Findings: Patchy upper lobe airspace opacities persist, unchanged. Improvement in the infrahilar opacity since prior study.  Heart is normal size.  No effusions or acute bony abnormality.  IMPRESSION: Persistent right upper lobe airspace opacities with improvement in the right infrahilar opacity since prior study.  Original Report Authenticated By: Cyndie Chime, M.D.   Dg Abd 2 Views  08/07/2011  *RADIOLOGY REPORT*  Clinical Data: Right lower quadrant abdominal pain.  ABDOMEN - 2 VIEW  Comparison: 06/09/2011  Findings: Upright film shows no evidence for intraperitoneal free air. There is no evidence for gaseous bowel dilation to suggest obstruction.  Gas is seen in the relatively featureless left colon. Surgical clips in the right upper quadrant suggest prior cholecystectomy.  Surgical ligation clips are seen in the anatomic pelvis.  No unexpected abdominopelvic calcification.  IMPRESSION:   No intraperitoneal free air.  Gas is seen within a relatively featureless descending colon.  This can be seen in cases of colonic wall edema/inflammation.  The CT imaging may prove helpful to further evaluate as clinically warranted.  Original Report Authenticated By: ERIC A. MANSELL, M.D.   Dg C-arm Bronchoscopy  08/31/2011  CLINICAL DATA: bronch   C-ARM BRONCHOSCOPY  Fluoroscopy was utilized by the requesting physician.  No radiographic  interpretation.     Disposition: Home  Diet: Heart healthy diet  Activity: Resume as tolerated.   Follow-up Appts: Discharge Orders    Future  Appointments: Provider: Department: Dept Phone: Center:   09/07/2011 10:00 AM Storm Frisk, MD [facility] [phone] None     Future Orders Please Complete By Expires   Diet - low sodium heart healthy      Increase activity slowly      Discharge instructions      Comments:   Followup with MARLETTE,MARNIE, MD (PCP) in 1 week. Dr. Lynelle Doctor office (pulmonary) will call you with appointment date and time.      TESTS THAT NEED FOLLOW-UP CMV pending.  Hypersensitivity pneumonitis profile pending.    Pathology from bronchoscopy pending.  Dr. Delford Field to followup on these pending labs as outpatient.  Time spent on discharge, talking to the patient, and coordinating care: 35 mins.  Signed: Cristal Ford, MD 09/02/2011, 10:35 AM

## 2011-09-02 NOTE — Progress Notes (Signed)
Subjective: Breathing better.  Right shoulder pain improved today.  Objective: Vital signs in last 24 hours: Filed Vitals:   09/01/11 1230 09/01/11 2130 09/02/11 0216 09/02/11 0530  BP: 102/65 154/95 148/84 151/57  Pulse: 89 58 58 56  Temp: 98.2 F (36.8 C) 98.1 F (36.7 C) 97.5 F (36.4 C) 98 F (36.7 C)  TempSrc: Oral Oral Oral Oral  Resp: 16 15 14 15   Height:      Weight:      SpO2: 99% 98% 96% 96%   Weight change:   Intake/Output Summary (Last 24 hours) at 09/02/11 1020 Last data filed at 09/02/11 0900  Gross per 24 hour  Intake   1020 ml  Output      0 ml  Net   1020 ml    Physical Exam: General: Awake, Oriented, No acute distress. HEENT: EOMI. Neck: Supple CV: S1 and S2 Lungs: Clear to ascultation bilaterally Abdomen: Soft, Nontender, Nondistended, +bowel sounds. Ext: Good pulses. Trace edema.  Lab Results:  Basename 09/02/11 0356 08/31/11 0355  NA 136 139  K 3.0* 3.6  CL 99 102  CO2 28 27  GLUCOSE 102* 130*  BUN 17 9  CREATININE 0.55 0.41*  CALCIUM 8.2* 8.4  MG -- --  PHOS -- --   No results found for this basename: AST:2,ALT:2,ALKPHOS:2,BILITOT:2,PROT:2,ALBUMIN:2 in the last 72 hours No results found for this basename: LIPASE:2,AMYLASE:2 in the last 72 hours  Basename 09/02/11 0356 08/31/11 0355  WBC 17.1* 18.4*  NEUTROABS -- --  HGB 11.6* 10.5*  HCT 34.3* 31.2*  MCV 100.0 101.6*  PLT 424* 429*    Basename 08/30/11 1138  CKTOTAL 21  CKMB 2.1  CKMBINDEX --  TROPONINI <0.30   No components found with this basename: POCBNP:3 No results found for this basename: DDIMER:2 in the last 72 hours No results found for this basename: HGBA1C:2 in the last 72 hours No results found for this basename: CHOL:2,HDL:2,LDLCALC:2,TRIG:2,CHOLHDL:2,LDLDIRECT:2 in the last 72 hours No results found for this basename: TSH,T4TOTAL,FREET3,T3FREE,THYROIDAB in the last 72 hours No results found for this basename:  VITAMINB12:2,FOLATE:2,FERRITIN:2,TIBC:2,IRON:2,RETICCTPCT:2 in the last 72 hours  Micro Results: Recent Results (from the past 240 hour(s))  FUNGUS CULTURE W SMEAR     Status: Normal (Preliminary result)   Collection Time   08/31/11  2:59 PM      Component Value Range Status Comment   Specimen Description BRONCHIAL ALVEOLAR LAVAGE   Final    Special Requests Normal   Final    Fungal Smear NO YEAST OR FUNGAL ELEMENTS SEEN   Final    Culture CULTURE IN PROGRESS FOR FOUR WEEKS   Final    Report Status PENDING   Incomplete   LEGIONELLA CULTURE     Status: Normal (Preliminary result)   Collection Time   08/31/11  2:59 PM      Component Value Range Status Comment   Specimen Description BRONCHIAL ALVEOLAR LAVAGE   Final    Special Requests Normal   Final    Culture     Final    Value: NO LEGIONELLA ISOLATED, CULTURE IN PROGRESS FOR 5 DAYS   Report Status PENDING   Incomplete   PNEUMOCYSTIS JIROVECI SMEAR BY DFA     Status: Normal   Collection Time   08/31/11  2:59 PM      Component Value Range Status Comment   Specimen Source-[facility] BRONCHIAL ALVEOLAR LAVAGE   Final    Pneumocystis jiroveci Ag NEGATIVE   Final Performed at [facility]  AFB CULTURE WITH SMEAR     Status: Normal (Preliminary result)   Collection Time   08/31/11  3:15 PM      Component Value Range Status Comment   Specimen Description BRONCHIAL ALVEOLAR LAVAGE   Final    Special Requests Normal   Final    ACID FAST SMEAR NO ACID FAST BACILLI SEEN   Final    Culture     Final    Value: CULTURE WILL BE EXAMINED FOR 6 WEEKS BEFORE ISSUING A FINAL REPORT   Report Status PENDING   Incomplete     Studies/Results: Dg Chest 2 View  09/02/2011  *RADIOLOGY REPORT*  Clinical Data: History of pulmonary infiltrates.  Follow-up. History of shortness of breath and chest pain.  History of smoking.  CHEST - 2 VIEW  Comparison: 08/29/2011.  08/30/2011.CT 08/27/2011.  Findings: Cardiac silhouette is normal size and shape.   Patchy streaky infiltrative densities are seen within the right lung extending into the right upper lobe.  This appears improved since previous study.  Streaky infiltrative densities are also seen in the left upper lobe with improvement since prior study.  There is minimal atelectasis in the right medial base.  No pleural effusion is seen.  Nodularity on the PA image in the right base is unchanged.  Right paratracheal opacity is unchanged and is felt to be vascular.  There is minimal degenerative spondylosis.  IMPRESSION:  Patchy streaky infiltrative densities are seen within the right lung extending into the right upper lobe.  This appears improved since previous study.  Streaky infiltrative densities are also seen in the left upper lobe with improvement since prior study.  There is minimal atelectasis in the right medial base. Small nodular area in right base appears unchanged from previous studies.  Original Report Authenticated By: Crawford Givens, M.D.   Dg C-arm Bronchoscopy  08/31/2011  CLINICAL DATA: bronch   C-ARM BRONCHOSCOPY  Fluoroscopy was utilized by the requesting physician.  No radiographic  interpretation.      Medications: I have reviewed the patient's current medications. Scheduled Meds:    . ceFEPime (MAXIPIME) IV  1 g Intravenous Q8H  . enoxaparin  40 mg Subcutaneous Q24H  . levothyroxine  50 mcg Oral Daily  . metoCLOPramide  10 mg Oral Q6H  . mulitivitamin with minerals  1 tablet Oral Daily  . nicotine  14 mg Transdermal Daily  . nortriptyline  25 mg Oral QHS  . oxyCODONE  10 mg Oral Q12H  . predniSONE  30 mg Oral BID WC  . saccharomyces boulardii  250 mg Oral BID  . sucralfate  1 g Oral QID  . vancomycin  1,000 mg Intravenous Q8H  . DISCONTD: budesonide  9 mg Oral BH-q7a  . DISCONTD: hydrocortisone  10 mg Oral BID  . DISCONTD: vancomycin  750 mg Intravenous Q8H   Continuous Infusions:   PRN Meds:.acetaminophen, acetaminophen, albuterol, alum & mag hydroxide-simeth,  clonazePAM, hydrALAZINE, ketorolac, methocarbamol, morphine injection, oxyCODONE, promethazine, traMADol, traZODone  Assessment/Plan: Nodular pulmonary infiltrates/atypical pneumonia Continue broad-spectrum antibiotics, chest x-ray performed on 08/29/2011 shows persistent right upper lobe airspace opacities with improvement in right infrahilar opacity.  Chest x-ray on May 31 of thousand 13 showed patchy streaky infiltrate within the right lung extending into the right upper lobe improved from previous study.  Discussed with Dr. Delford Field, discontinue vancomycin and cefepime as the patient has completed 6 day empiric course, infectious workup negative.  Antibiotics since 08/28/2011.  Pulmonary following and had  bronchoscopy on 08/31/2011, pneumocystis smear by DFA was negative, AFB negative, legionella negative, fungal negative, cultures still pending. Rheumatoid factor and IgE normal.  Sedimentation rate mildly elevated at 27 .HIV nonreactive.  ANA and ANCA negative. TSH Normal. CMV pending.  Hypersensitivity pneumonitis profile pending.    Pathology from bronchoscopy pending.  Patient to followup with Dr. Delford Field as outpatient.  Continue prednisone 20 mg twice daily at discharge until patient sees Dr. Delford Field.  Right shoulder pain Likely due to pulmonary infiltrates. Improved.  Chronic pain syndrome Continue current pain regimen at this time.  Make cautious adjustments in pain regimen as the patient may have opiate dependence.  Leukocytosis Likely secondary to IV steroids/pneumonia.  Anemia Likely due to chronic disease.  Addison's disease Continue prednisone for now.  Discontinue home hydrocortisone.  Anxiety and depression continue home medications.  Chest pain Likely due to pulmonary infiltrates.  Troponin negative x2.  History of collagenous colitis Stable.  Prophylaxis Lovenox.  Disposition Discharge the patient today.   LOS: 6 days  Rebel Willcutt A, MD 09/02/2011, 10:20 AM

## 2011-09-06 LAB — HYPERSENSITIVITY PNUEMONITIS PROFILE

## 2011-09-06 LAB — LEGIONELLA CULTURE

## 2011-09-07 ENCOUNTER — Encounter: Payer: Self-pay | Admitting: Critical Care Medicine

## 2011-09-07 ENCOUNTER — Other Ambulatory Visit: Payer: 59

## 2011-09-07 ENCOUNTER — Ambulatory Visit (INDEPENDENT_AMBULATORY_CARE_PROVIDER_SITE_OTHER): Payer: 59 | Admitting: Critical Care Medicine

## 2011-09-07 ENCOUNTER — Ambulatory Visit (INDEPENDENT_AMBULATORY_CARE_PROVIDER_SITE_OTHER)
Admission: RE | Admit: 2011-09-07 | Discharge: 2011-09-07 | Disposition: A | Discharge status: Home / Self Care | Payer: 59 | Source: Ambulatory Visit | Attending: Critical Care Medicine | Admitting: Critical Care Medicine

## 2011-09-07 VITALS — BP 102/68 | HR 111 | Temp 98.1°F | Ht 64.0 in | Wt 136.6 lb

## 2011-09-07 DIAGNOSIS — J8410 Pulmonary fibrosis, unspecified: Secondary | ICD-10-CM

## 2011-09-07 DIAGNOSIS — J679 Hypersensitivity pneumonitis due to unspecified organic dust: Secondary | ICD-10-CM

## 2011-09-07 MED ORDER — PREDNISONE 20 MG PO TABS: ORAL_TABLET | ORAL | Status: DC

## 2011-09-07 MED ORDER — HYDROCORTISONE 20 MG PO TABS: ORAL_TABLET | ORAL | Status: DC

## 2011-09-07 NOTE — Assessment & Plan Note (Signed)
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 Elevated IgE at 77 Eosinophilia on bronchoalveolar lavage at 19% of cell count Bronchoscopy May 2013 not diagnostic Clearance of infiltrates with course of corticosteroids June 2013 Negative hypersensitivity profile Normal ANA, rheumatoid factor Improvement of infiltrates noted on today's radiograph with improved symptom complex. 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

## 2011-09-07 NOTE — Progress Notes (Signed)
Subjective:    Patient ID: Dawn Frost, female    DOB: [birth year], 46 y.o.   MRN: [id number]  HPI This is a 46 year old female with multiple chronic medical problems [facility] because of fatigue weakness fever and chills. No positive microbiologic data was obtained. Bronchoscopy was obtained and showed airway inflammation but no specific diagnosis. IgE level was elevated at 77. The patient was found to have eosinophilia on bronchoalveolar lavage. There was no evidence of peripheral eosinophilia. The patient's ANA and rheumatoid factor levels were normal.  The patient was treated with corticosteroids and did improve. Antibiotics were discontinued. The patient is sent home on tapering dose of prednisone. Chest x-ray and CT scans the chest did show extensive patchy bilateral pulmonary infiltrates. There was a groundglass pattern seen throughout all lobes on the CT scan. The parents did not suggest bacterial pneumonia.  Overall the patient is now improved. Patient has no cough. There is no real wheeze. The patient gets short of breath with exertion. The patient now down to 20 mg twice daily on prednisone.   No real chest pain.  Not yet back at work. No emesis.  Colitis issues are stable.  GI doc is Economist.  F[place]the MD Microscopic and lymphocytic colitis IBD.   Past Medical History  Diagnosis Date  . Collagenous colitis   . WPW (Wolff-Parkinson-White syndrome)   . Kidney stone   . Addison disease   . Thyroid disease   . Clostridium difficile diarrhea   . Anxiety and depression   . Chronic diarrhea   . Chronic back pain   . Drug-seeking behavior   . Irritable bowel syndrome (IBS)   . Anemia      Family History  Problem Relation Age of Onset  . Prostate cancer Father   . Coronary artery disease Maternal Grandfather      History   Social History  . Marital Status: Married    Spouse Name: N/A    Number of Children: 3  . Years of Education:  N/A   Occupational History  . Inventory in a warehouse    Social History Main Topics  . Smoking status: Current Everyday Smoker -- 0.3 packs/day for 30 years    Types: Cigarettes  . Smokeless tobacco: Never Used  . Alcohol Use: No  . Drug Use: No  . Sexually Active: Yes    Birth Control/ Protection: Surgical   Other Topics Concern  . Not on file   Social History Narrative  . No narrative on file     Allergies  Allergen Reactions  . Ambien (Zolpidem Tartrate) Other (See Comments)    headache  . Nsaids Other (See Comments)    Bad for colitis  . Zofran Other (See Comments)    Headache      Outpatient Prescriptions Prior to Visit  Medication Sig Dispense Refill  . levothyroxine (SYNTHROID, LEVOTHROID) 50 MCG tablet Take 50 mcg by mouth daily.        . Multiple Vitamin (MULITIVITAMIN WITH MINERALS) TABS Take 1 tablet by mouth daily.        . nortriptyline (PAMELOR) 25 MG capsule Take 25 mg by mouth at bedtime.      Marland Kitchen oxyCODONE (OXY IR/ROXICODONE) 5 MG immediate release tablet Take 1 tablet (5 mg total) by mouth every 4 (four) hours as needed for pain.  15 tablet  0  . potassium chloride SA (K-DUR,KLOR-CON) 20 MEQ tablet Take 2 tablets (40 mEq total) by  mouth 2 (two) times daily.    0  . promethazine (PHENERGAN) 12.5 MG tablet Take 1 tablet (12.5 mg total) by mouth every 6 (six) hours as needed for nausea.  15 tablet  0  . ranitidine (ZANTAC) 150 MG capsule Take 1 capsule (150 mg total) by mouth daily.  30 capsule  0  . sucralfate (CARAFATE) 1 GM/10ML suspension Take 10 mLs (1 g total) by mouth 4 (four) times daily.  420 mL  1  . metoCLOPramide (REGLAN) 10 MG tablet Take 1 tablet (10 mg total) by mouth every 6 (six) hours.  30 tablet  0  . predniSONE (DELTASONE) 20 MG tablet Take 1 tablet (20 mg total) by mouth 2 (two) times daily with a meal.  42 tablet  0  . clonazePAM (KLONOPIN) 1 MG tablet Take 1 mg by mouth 4 (four) times daily as needed. anxiety      . UNABLE TO FIND  Please excuse Ms. Bellanger from any work obligations from 08/28/2011 till 09/04/2011 as the patient was hospitalized. Patient to return to work without any restrictions on 09/05/2011.  1 Units  0      Review of Systems Constitutional:   No  weight loss, night sweats,  Fevers, chills, fatigue, lassitude. HEENT:   No headaches,  Difficulty swallowing,  Tooth/dental problems,  Sore throat,                No sneezing, itching, ear ache, nasal congestion, post nasal drip,   CV:  No chest pain,  Orthopnea, PND, swelling in lower extremities, anasarca, dizziness, palpitations  GI  No heartburn, indigestion, abdominal pain, nausea, vomiting, diarrhea, change in bowel habits, loss of appetite  Resp: No shortness of breath with exertion or at rest.  No excess mucus, no productive cough,  No non-productive cough,  No coughing up of blood.  No change in color of mucus.  No wheezing.  No chest wall deformity  Skin: no rash or lesions.  GU: no dysuria, change in color of urine, no urgency or frequency.  No flank pain.  MS:  No joint pain or swelling.  No decreased range of motion.  No back pain.  Psych:  No change in mood or affect. No depression or anxiety.  No memory loss.     Objective:   Physical Exam Filed Vitals:   09/07/11 0957  BP: 102/68  Pulse: 111  Temp: 98.1 F (36.7 C)  TempSrc: Oral  Height: 5\' 4"  (1.626 m)  Weight: 136 lb 9.6 oz (61.961 kg)  SpO2: 97%    Gen: Pleasant, well-nourished, in no distress,  normal affect  ENT: No lesions,  mouth clear,  oropharynx clear, no postnasal drip  Neck: No JVD, no TMG, no carotid bruits  Lungs: No use of accessory muscles, no dullness to percussion, clear without rales or rhonchi  Cardiovascular: RRR, heart sounds normal, no murmur or gallops, no peripheral edema  Abdomen: soft and NT, no HSM,  BS normal  Musculoskeletal: No deformities, no cyanosis or clubbing  Neuro: alert, non focal  Skin: Warm, no lesions or rashes  Dg Chest 2  View  09/07/2011  *RADIOLOGY REPORT*  Clinical Data: Pulmonary fibrosis, shortness of breath.  CHEST - 2 VIEW  Comparison: 09/02/2011  Findings: Nodular density at the right lung base, unchanged. Chronic densities in the upper lobes bilaterally, likely scarring. Heart is normal size.  No effusions.  No acute bony abnormality.  IMPRESSION: Stable chronic changes in the upper lobes and nodular  density at the right lung base.  Original Report Authenticated By: Cyndie Chime, M.D.          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 Elevated IgE at 77 Eosinophilia on bronchoalveolar lavage at 19% of cell count Bronchoscopy May 2013 not diagnostic Clearance of infiltrates with course of corticosteroids June 2013 Negative hypersensitivity profile Normal ANA, rheumatoid factor Improvement of infiltrates noted on today's radiograph with improved symptom complex. 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 Active smoking use likely a trigger Plan Slow prednisone taper to off Given history of Addison's disease we will switch over to hydrocortisone once current course of prednisone complete Dose of hydrocortisone will be 20 mg twice daily The patient's encouraged to pursue smoking cessation and will use nicotine replacement therapy Consideration for allergy referral will be made however we'll obtain RAST  assay first 10 minutes of smoking cessation counseling administered to patient Return 2 months     Updated Medication List Outpatient Encounter Prescriptions as of 09/07/2011  Medication Sig Dispense Refill  . levothyroxine (SYNTHROID, LEVOTHROID) 50 MCG tablet Take 50 mcg by mouth daily.        . metoCLOPramide (REGLAN) 10 MG tablet Take 10 mg by mouth as needed.      . Multiple Vitamin (MULITIVITAMIN WITH MINERALS) TABS Take 1 tablet by  mouth daily.        . nortriptyline (PAMELOR) 25 MG capsule Take 25 mg by mouth at bedtime.      Marland Kitchen oxyCODONE (OXY IR/ROXICODONE) 5 MG immediate release tablet Take 1 tablet (5 mg total) by mouth every 4 (four) hours as needed for pain.  15 tablet  0  . potassium chloride SA (K-DUR,KLOR-CON) 20 MEQ tablet Take 2 tablets (40 mEq total) by mouth 2 (two) times daily.    0  . predniSONE (DELTASONE) 20 MG tablet Take 20mg  twice daily for 5 days more then reduce to 20mg  AM 10mg  PM for 5 days then 10mg  twice daily until current supply gone then stop  42 tablet  0  . promethazine (PHENERGAN) 12.5 MG tablet Take 1 tablet (12.5 mg total) by mouth every 6 (six) hours as needed for nausea.  15 tablet  0  . ranitidine (ZANTAC) 150 MG capsule Take 1 capsule (150 mg total) by mouth daily.  30 capsule  0  . sucralfate (CARAFATE) 1 GM/10ML suspension Take 10 mLs (1 g total) by mouth 4 (four) times daily.  420 mL  1  . DISCONTD: metoCLOPramide (REGLAN) 10 MG tablet Take 1 tablet (10 mg total) by mouth every 6 (six) hours.  30 tablet  0  . DISCONTD: predniSONE (DELTASONE) 20 MG tablet Take 1 tablet (20 mg total) by mouth 2 (two) times daily with a meal.  42 tablet  0  . clonazePAM (KLONOPIN) 1 MG tablet Take 1 mg by mouth 4 (four) times daily as needed. anxiety      . hydrocortisone (CORTEF) 20 MG tablet Resume when prednisone runs out      . DISCONTD: UNABLE TO FIND Please excuse Ms. Borromeo from any work obligations from 08/28/2011 till 09/04/2011 as the patient was hospitalized. Patient to return to work without any restrictions on 09/05/2011.  1 Units  0

## 2011-09-07 NOTE — Patient Instructions (Signed)
Focus on smoking cessation, use nicotine patches Take prednisone 20mg  twice daily for 5 days then 20mg  AM 10mg  PM for 5 days then 10mg  twice daily until bottle empty then resume Hydrocortisone 20mg  twice daily Labs today: allergy labs, will call with results Return one month [place] office

## 2011-09-08 LAB — ALLERGY FULL PROFILE
Bahia Grass: 0.15 kU/L (ref <0.35)
Box Elder IgE: <0.1 kU/L (ref <0.35)
Candida Albicans: <0.1 kU/L (ref <0.35)
Curvularia lunata: <0.1 kU/L (ref <0.35)
Fescue: 0.12 kU/L (ref <0.35)
G005 Rye, Perennial: 0.13 kU/L (ref <0.35)
G009 Red Top: 0.14 kU/L (ref <0.35)
Goldenrod: 0.11 kU/L (ref <0.35)
Helminthosporium halodes: <0.1 kU/L (ref <0.35)
Sycamore Tree: 0.12 kU/L (ref <0.35)

## 2011-09-11 ENCOUNTER — Emergency Department (HOSPITAL_BASED_OUTPATIENT_CLINIC_OR_DEPARTMENT_OTHER): Payer: 59

## 2011-09-11 ENCOUNTER — Emergency Department (HOSPITAL_BASED_OUTPATIENT_CLINIC_OR_DEPARTMENT_OTHER)
Admission: EM | Admit: 2011-09-11 | Discharge: 2011-09-11 | Disposition: A | Discharge status: Home / Self Care | Payer: 59 | Attending: Emergency Medicine | Admitting: Emergency Medicine

## 2011-09-11 DIAGNOSIS — G8929 Other chronic pain: Secondary | ICD-10-CM | POA: Insufficient documentation

## 2011-09-11 DIAGNOSIS — Z79899 Other long term (current) drug therapy: Secondary | ICD-10-CM | POA: Insufficient documentation

## 2011-09-11 DIAGNOSIS — R1110 Vomiting, unspecified: Secondary | ICD-10-CM | POA: Insufficient documentation

## 2011-09-11 DIAGNOSIS — E079 Disorder of thyroid, unspecified: Secondary | ICD-10-CM | POA: Insufficient documentation

## 2011-09-11 DIAGNOSIS — K589 Irritable bowel syndrome without diarrhea: Secondary | ICD-10-CM | POA: Insufficient documentation

## 2011-09-11 DIAGNOSIS — R0789 Other chest pain: Secondary | ICD-10-CM

## 2011-09-11 DIAGNOSIS — E2749 Other adrenocortical insufficiency: Secondary | ICD-10-CM | POA: Insufficient documentation

## 2011-09-11 DIAGNOSIS — R071 Chest pain on breathing: Secondary | ICD-10-CM | POA: Insufficient documentation

## 2011-09-11 MED ORDER — PREDNISONE 10 MG PO TABS: ORAL_TABLET | ORAL | Status: DC

## 2011-09-11 MED ORDER — SODIUM CHLORIDE 0.9 % IV SOLN
Freq: Once | INTRAVENOUS | Status: AC
  Administered 2011-09-11: 18:00:00 via INTRAVENOUS

## 2011-09-11 MED ORDER — METOCLOPRAMIDE HCL 5 MG/ML IJ SOLN
10.0000 mg | Freq: Once | INTRAMUSCULAR | Status: AC
  Administered 2011-09-11: 10 mg via INTRAVENOUS
  Filled 2011-09-11: qty 2

## 2011-09-11 MED ORDER — HYDROMORPHONE HCL PF 1 MG/ML IJ SOLN
1.0000 mg | Freq: Once | INTRAMUSCULAR | Status: AC
  Administered 2011-09-11: 1 mg via INTRAVENOUS
  Filled 2011-09-11: qty 1

## 2011-09-11 MED ORDER — OXYCODONE-ACETAMINOPHEN 5-325 MG PO TABS: 2.0000 | ORAL_TABLET | ORAL | Status: DC | PRN

## 2011-09-11 NOTE — ED Notes (Signed)
Pt reports she was d/c 5/31 from [place]- reports sx have returned: N/v/d and right side chest pain

## 2011-09-11 NOTE — Discharge Instructions (Signed)
Chest Wall Pain Chest wall pain is pain in or around the bones and muscles of your chest. It may take up to 6 weeks to get better. It may take longer if you must stay physically active in your work and activities.  CAUSES  Chest wall pain may happen on its own. However, it may be caused by:  A viral illness like the flu.   Injury.   Coughing.   Exercise.   Arthritis.   Fibromyalgia.   Shingles.  HOME CARE INSTRUCTIONS   Avoid overtiring physical activity. Try not to strain or perform activities that cause pain. This includes any activities using your chest or your abdominal and side muscles, especially if heavy weights are used.   Put ice on the sore area.   Put ice in a plastic bag.   Place a towel between your skin and the bag.   Leave the ice on for 15 to 20 minutes per hour while awake for the first 2 days.   Only take over-the-counter or prescription medicines for pain, discomfort, or fever as directed by your caregiver.  SEEK IMMEDIATE MEDICAL CARE IF:   Your pain increases, or you are very uncomfortable.   You have a fever.   Your chest pain becomes worse.   You have new, unexplained symptoms.   You have nausea or vomiting.   You feel sweaty or lightheaded.   You have a cough with phlegm (sputum), or you cough up blood.  MAKE SURE YOU:   Understand these instructions.   Will watch your condition.   Will get help right away if you are not doing well or get worse.  Document Released: 03/21/2005 Document Revised: 03/10/2011 Document Reviewed: 11/15/2010 [employer] Patient Information 2012 [employer]. 

## 2011-09-11 NOTE — ED Provider Notes (Signed)
History     CSN: [id number]  Arrival date & time 09/11/11  1627   First MD Initiated Contact with Patient 09/11/11 1731      Chief Complaint  Patient presents with  . Emesis  . Chest Pain    (Consider location/radiation/quality/duration/timing/severity/associated sxs/prior treatment) Patient is a 46 y.o. female presenting with chest pain. The history is provided by the patient. No language interpreter was used.  Chest Pain The chest pain began yesterday. Chest pain occurs constantly. The chest pain is unchanged. The pain is associated with breathing. At its most intense, the pain is at 7/10. The pain is currently at 7/10. The severity of the pain is severe. The quality of the pain is described as aching. The pain does not radiate. Chest pain is worsened by certain positions. Primary symptoms include cough. She tried nothing for the symptoms. Risk factors include no known risk factors.  Her family medical history is significant for heart disease in family.    she reports she was admitted to [facility] May 31 patient reports she was treated by Dr. Delford Field and diagnosed with a form of pulmonary fibrosis she reports she still has test pending patient reports she had a similar episode to what she is experiencing now before she went into the hospital. Patient complains of pain in the right side of her chest she feels short of breath. Patient complains of coughing. Patient reports Dr. Delford Field treated her with prednisone and she was feeling better while she was on the prednisone since she has stopped symptoms are returning. Patient reports she is supposed to see Dr. Delford Field for followup. Past Medical History  Diagnosis Date  . Collagenous colitis   . WPW (Wolff-Parkinson-White syndrome)   . Kidney stone   . Addison disease   . Thyroid disease   . Clostridium difficile diarrhea   . Anxiety and depression   . Chronic diarrhea   . Chronic back pain   . Drug-seeking behavior   . Irritable  bowel syndrome (IBS)   . Anemia     Past Surgical History  Procedure Date  . Cholecystectomy   . Tubal ligation   . Lithotripsy   . Sphincterotomy   . Cardiac electrophysiology mapping and ablation     for WPW  . Video bronchoscopy 08/31/2011    Procedure: VIDEO BRONCHOSCOPY WITH FLUORO;  Surgeon: Storm Frisk, MD;  Location: [facility];  Service: Cardiopulmonary;  Laterality: N/A;    Family History  Problem Relation Age of Onset  . Prostate cancer Father   . Coronary artery disease Maternal Grandfather     History  Substance Use Topics  . Smoking status: Current Everyday Smoker -- 0.3 packs/day for 30 years    Types: Cigarettes  . Smokeless tobacco: Never Used  . Alcohol Use: No    OB History    Grav Para Term Preterm Abortions TAB SAB Ect Mult Living   3 3              Review of Systems  Respiratory: Positive for cough.   Cardiovascular: Positive for chest pain.  All other systems reviewed and are negative.    Allergies  Ambien; Nsaids; and Zofran  Home Medications   Current Outpatient Rx  Name Route Sig Dispense Refill  . CLONAZEPAM 1 MG PO TABS Oral Take 1 mg by mouth 4 (four) times daily as needed. anxiety    . HYDROCORTISONE 20 MG PO TABS  Resume when prednisone runs out    .  LEVOTHYROXINE SODIUM 50 MCG PO TABS Oral Take 50 mcg by mouth daily.      . ADULT MULTIVITAMIN W/MINERALS CH Oral Take 1 tablet by mouth daily.      Marland Kitchen NORTRIPTYLINE HCL 25 MG PO CAPS Oral Take 25 mg by mouth at bedtime.    Marland Kitchen POTASSIUM CHLORIDE CRYS ER 20 MEQ PO TBCR Oral Take 2 tablets (40 mEq total) by mouth 2 (two) times daily.  0  . PREDNISONE 20 MG PO TABS  Take 20mg  twice daily for 5 days more then reduce to 20mg  AM 10mg  PM for 5 days then 10mg  twice daily until current supply gone then stop 42 tablet 0  . RANITIDINE HCL 150 MG PO CAPS Oral Take 1 capsule (150 mg total) by mouth daily. 30 capsule 0  . METOCLOPRAMIDE HCL 10 MG PO TABS Oral Take 10 mg by mouth as needed.      . OXYCODONE HCL 5 MG PO TABS Oral Take 1 tablet (5 mg total) by mouth every 4 (four) hours as needed for pain. 15 tablet 0  . PROMETHAZINE HCL 12.5 MG PO TABS Oral Take 1 tablet by mouth Once daily as needed.    . SUCRALFATE 1 GM/10ML PO SUSP Oral Take 10 mLs (1 g total) by mouth 4 (four) times daily. 420 mL 1    BP 99/70  Pulse 113  Temp 98.1 F (36.7 C)  Resp 20  Ht 5\' 4"  (1.626 m)  Wt 133 lb (60.328 kg)  BMI 22.83 kg/m2  SpO2 98%  LMP 08/31/2011  Physical Exam  Vitals reviewed. Constitutional: She appears well-developed and well-nourished.  HENT:  Head: Normocephalic and atraumatic.  Right Ear: External ear normal.  Left Ear: External ear normal.  Nose: Nose normal.  Mouth/Throat: Oropharynx is clear and moist.  Eyes: Conjunctivae and EOM are normal. Pupils are equal, round, and reactive to light.  Neck: Normal range of motion. Neck supple.  Cardiovascular: Normal rate, regular rhythm and normal heart sounds.   Pulmonary/Chest: Effort normal and breath sounds normal.  Abdominal: Soft. Bowel sounds are normal.  Musculoskeletal: Normal range of motion.  Neurological: She is alert.  Skin: Skin is warm.  Psychiatric: She has a normal mood and affect.    ED Course  Procedures (including critical care time)  Labs Reviewed - No data to display No results found. .edthis  1. Chest wall pain       MDM  EKG was normal chest x-ray is read by the radiologist as unchanged. Chest x-ray does show what the radiologist calls stable streaky infiltrates bilateral upper lobes. I reviewed the notes from patient's hospitalization. There was no evidence of bacterial infection. I advised the patient that I will treat her with another course of prednisone patient is advised to continue using her albuterol inhaler she is to call her physician's office for followup appointment in 2-3 days patient is advised to return to the emergency department if symptoms worsen or change      Elson Areas, [place] 09/13/11 1844

## 2011-09-11 NOTE — ED Notes (Signed)
Report received and care assumed.

## 2011-09-12 ENCOUNTER — Telehealth: Payer: Self-pay | Admitting: Critical Care Medicine

## 2011-09-12 NOTE — Progress Notes (Signed)
Quick Note:  [email or web address] ______ 

## 2011-09-12 NOTE — Telephone Encounter (Signed)
Notes Recorded by Storm Frisk, MD on 09/09/2011 at 12:51 PM Call pt and tell her she has multiple positive allergies on her RAST assay. We will review at next OV. No mold allergies seen   I spoke with patient about results and she verbalized understanding and had no questions.

## 2011-09-13 ENCOUNTER — Ambulatory Visit (INDEPENDENT_AMBULATORY_CARE_PROVIDER_SITE_OTHER): Payer: 59 | Admitting: Adult Health

## 2011-09-13 ENCOUNTER — Encounter: Payer: Self-pay | Admitting: Adult Health

## 2011-09-13 VITALS — BP 100/80 | HR 90 | Temp 98.2°F | Ht 64.0 in | Wt 137.8 lb

## 2011-09-13 DIAGNOSIS — J679 Hypersensitivity pneumonitis due to unspecified organic dust: Secondary | ICD-10-CM

## 2011-09-13 NOTE — Assessment & Plan Note (Addendum)
Slow to resolve flare with multiple somatic complaints ? Medication side effects  XRay without change  Encouraged on smoking cesstation  Will cont to taper steroids as per Dr. Delford Field  Plan  Plan:  Decrease prednisone 20mg  twice daily for 5 days then 20mg  AM 10mg  PM for 5 days then 10mg  twice daily until bottle empty then resume Hydrocortisone 20mg  twice daily Follow up at [facility] office in 3-4 weeks as planned  Please contact office for sooner follow up if symptoms do not improve or worsen or seek emergency care

## 2011-09-13 NOTE — Patient Instructions (Addendum)
Work on stopping smoking  Decrease prednisone 20mg  twice daily for 5 days then 20mg  AM 10mg  PM for 5 days then 10mg  twice daily until bottle empty then resume Hydrocortisone 20mg  twice daily Follow up at [facility] office in 3-4 weeks as planned  Please contact office for sooner follow up if symptoms do not improve or worsen or seek emergency care

## 2011-09-13 NOTE — Progress Notes (Signed)
Subjective:    Patient ID: Dawn Frost, female    DOB: 06/09/1965, 46 y.o.   MRN: [id number]  HPI  This is a 46 year old female with multiple chronic medical problems including collagenous colitis, adrenal insufficiency on [facility] because of fatigue weakness fever and chills. No positive microbiologic data was obtained. Bronchoscopy was obtained and showed airway inflammation but no specific diagnosis. IgE level was elevated at 77. The patient was found to have eosinophilia on bronchoalveolar lavage. There was no evidence of peripheral eosinophilia. The patient's ANA and rheumatoid factor levels were normal.  The patient was treated with corticosteroids and did improve. Antibiotics were discontinued. The patient is sent home on tapering dose of prednisone. Chest x-ray and CT scans the chest did show extensive patchy bilateral pulmonary infiltrates. There was a groundglass pattern seen throughout all lobes on the CT scan. > did not suggest bacterial pneumonia.  09/07/11  Overall the patient is now improved. Patient has no cough. There is no real wheeze. The patient gets short of breath with exertion. The patient now down to 20 mg twice daily on prednisone.   No real chest pain.  Not yet back at work. No emesis.  Colitis issues are stable.  GI doc is Economist.  Forsythe MD Microscopic and lymphocytic colitis IBD.  >CXR w/ decreased aspdz  RAST w/ multiple positive allergens , IGE 31 , slow steroid taper recs   09/13/2011 ER follow up  Pt returns for ER follow up  Seen in ER on 6/9  due to recurrent chest pain, fatigue, no energy.  CXR showed no change in streaky densities in upper lobes and r. Basilar nodule  EKG without acute process.  She says she was continued on prednisone dose with no changes.  ER note is incomplete at present time for recommendations.  Main complaint is she does not have any energy, wears out easily.  No increased cough  , dyspnea or discolored mucus.  No fever or edema.    Past Medical History  Diagnosis Date  . Collagenous colitis   . WPW (Wolff-Parkinson-White syndrome)   . Kidney stone   . Addison disease   . Thyroid disease   . Clostridium difficile diarrhea   . Anxiety and depression   . Chronic diarrhea   . Chronic back pain   . Drug-seeking behavior   . Irritable bowel syndrome (IBS)   . Anemia      Family History  Problem Relation Age of Onset  . Prostate cancer Father   . Coronary artery disease Maternal Grandfather      History   Social History  . Marital Status: Married    Spouse Name: N/A    Number of Children: 3  . Years of Education: N/A   Occupational History  . Inventory in a warehouse    Social History Main Topics  . Smoking status: Current Some Day Smoker -- 0.3 packs/day for 30 years    Types: Cigarettes  . Smokeless tobacco: Never Used  . Alcohol Use: No  . Drug Use: No  . Sexually Active: Yes    Birth Control/ Protection: Surgical   Other Topics Concern  . Not on file   Social History Narrative  . No narrative on file     Allergies  Allergen Reactions  . Ambien (Zolpidem Tartrate) Other (See Comments)    headache  . Nsaids Other (See Comments)    Bad for colitis  . Zofran  Other (See Comments)    Headache      Outpatient Prescriptions Prior to Visit  Medication Sig Dispense Refill  . hydrocortisone (CORTEF) 20 MG tablet Resume when prednisone runs out      . levothyroxine (SYNTHROID, LEVOTHROID) 50 MCG tablet Take 50 mcg by mouth daily.        . metoCLOPramide (REGLAN) 10 MG tablet Take 10 mg by mouth as needed.      . Multiple Vitamin (MULITIVITAMIN WITH MINERALS) TABS Take 1 tablet by mouth daily.        . nortriptyline (PAMELOR) 25 MG capsule Take 25 mg by mouth at bedtime.      Marland Kitchen oxyCODONE-acetaminophen (PERCOCET) 5-325 MG per tablet Take 2 tablets by mouth every 4 (four) hours as needed for pain.  14 tablet  0  . potassium chloride SA  (K-DUR,KLOR-CON) 20 MEQ tablet Take 2 tablets (40 mEq total) by mouth 2 (two) times daily.    0  . predniSONE (DELTASONE) 10 MG tablet 6,5,4,3,2,1 taper  21 tablet  0  . promethazine (PHENERGAN) 12.5 MG tablet Take 1 tablet by mouth Once daily as needed.      . ranitidine (ZANTAC) 150 MG capsule Take 1 capsule (150 mg total) by mouth daily.  30 capsule  0  . clonazePAM (KLONOPIN) 1 MG tablet Take 1 mg by mouth 4 (four) times daily as needed. anxiety      . oxyCODONE (OXY IR/ROXICODONE) 5 MG immediate release tablet Take 1 tablet (5 mg total) by mouth every 4 (four) hours as needed for pain.  15 tablet  0  . predniSONE (DELTASONE) 20 MG tablet Take 20mg  twice daily for 5 days more then reduce to 20mg  AM 10mg  PM for 5 days then 10mg  twice daily until current supply gone then stop  42 tablet  0  . sucralfate (CARAFATE) 1 GM/10ML suspension Take 10 mLs (1 g total) by mouth 4 (four) times daily.  420 mL  1      Review of Systems  Constitutional:   No  weight loss, night sweats,  Fevers, chills, + fatigue, lassitude. HEENT:   No headaches,  Difficulty swallowing,  Tooth/dental problems,  Sore throat,                No sneezing, itching, ear ache, nasal congestion, post nasal drip,   CV:  No  Orthopnea, PND, swelling in lower extremities, anasarca, dizziness, palpitations  GI  No heartburn, indigestion, abdominal pain, nausea, vomiting, diarrhea, change in bowel habits, loss of appetite  Resp:   No excess mucus, no productive cough,  No non-productive cough,  No coughing up of blood.  No change in color of mucus.  No wheezing.  No chest wall deformity  Skin: no rash or lesions.  GU: no dysuria, change in color of urine, no urgency or frequency.  No flank pain.  MS:  No joint pain or swelling.  No decreased range of motion.  No back pain.  Psych:  No change in mood or affect. No depression or anxiety.  No memory loss.     Objective:   Physical Exam  Filed Vitals:   09/13/11 1018  BP:  100/80  Pulse: 90  Temp: 98.2 F (36.8 C)  TempSrc: Oral  Height: 5\' 4"  (1.626 m)  Weight: 137 lb 12.8 oz (62.506 kg)  SpO2: 99%    Gen: Pleasant, well-nourished, in no distress,  normal affect  ENT: No lesions,  mouth clear,  oropharynx clear, no  postnasal drip  Neck: No JVD, no TMG, no carotid bruits  Lungs: No use of accessory muscles, no dullness to percussion, clear without rales or rhonchi  Cardiovascular: RRR, heart sounds normal, no murmur or gallops, no peripheral edema  Abdomen: soft and NT, no HSM,  BS normal  Musculoskeletal: No deformities, no cyanosis or clubbing  Neuro: alert, non focal  Skin: Warm, no lesions or rashes  Dg Chest 2 View  09/11/2011  *RADIOLOGY REPORT*  Clinical Data: Chest pain  CHEST - 2 VIEW  Comparison: 09/07/2011  Findings: Cardiomediastinal silhouette is stable. Stable streaky infiltrative densities bilateral upper lobe medially.  Stable nodule in the right base.  No convincing pulmonary edema.  Bony thorax is stable.   IMPRESSION: Stable streaky infiltrative densities bilateral upper lobe medially.  Stable nodule in the right base.  No convincing pulmonary edema.  Original Report Authenticated By: Natasha Mead, M.D.          Assessment & Plan:      Updated Medication List Outpatient Encounter Prescriptions as of 09/13/2011  Medication Sig Dispense Refill  . hydrocortisone (CORTEF) 20 MG tablet Resume when prednisone runs out      . levothyroxine (SYNTHROID, LEVOTHROID) 50 MCG tablet Take 50 mcg by mouth daily.        . metoCLOPramide (REGLAN) 10 MG tablet Take 10 mg by mouth as needed.      . Multiple Vitamin (MULITIVITAMIN WITH MINERALS) TABS Take 1 tablet by mouth daily.        . nortriptyline (PAMELOR) 25 MG capsule Take 25 mg by mouth at bedtime.      Marland Kitchen oxyCODONE-acetaminophen (PERCOCET) 5-325 MG per tablet Take 2 tablets by mouth every 4 (four) hours as needed for pain.  14 tablet  0  . potassium chloride SA (K-DUR,KLOR-CON) 20  MEQ tablet Take 2 tablets (40 mEq total) by mouth 2 (two) times daily.    0  . predniSONE (DELTASONE) 10 MG tablet 6,5,4,3,2,1 taper  21 tablet  0  . promethazine (PHENERGAN) 12.5 MG tablet Take 1 tablet by mouth Once daily as needed.      . ranitidine (ZANTAC) 150 MG capsule Take 1 capsule (150 mg total) by mouth daily.  30 capsule  0  . clonazePAM (KLONOPIN) 1 MG tablet Take 1 mg by mouth 4 (four) times daily as needed. anxiety      . MICRONIZED COLESTIPOL HCL 1 G tablet       . oxyCODONE (OXY IR/ROXICODONE) 5 MG immediate release tablet Take 1 tablet (5 mg total) by mouth every 4 (four) hours as needed for pain.  15 tablet  0  . predniSONE (DELTASONE) 20 MG tablet Take 20mg  twice daily for 5 days more then reduce to 20mg  AM 10mg  PM for 5 days then 10mg  twice daily until current supply gone then stop  42 tablet  0  . sucralfate (CARAFATE) 1 GM/10ML suspension Take 10 mLs (1 g total) by mouth 4 (four) times daily.  420 mL  1

## 2011-09-14 NOTE — ED Provider Notes (Signed)
Medical screening examination/treatment/procedure(s) were performed by non-physician practitioner and as supervising physician I was immediately available for consultation/collaboration.  Karleigh Bunte, MD 09/14/11 1758 

## 2011-09-15 ENCOUNTER — Encounter (HOSPITAL_COMMUNITY): Payer: Self-pay | Admitting: *Deleted

## 2011-09-15 ENCOUNTER — Emergency Department (HOSPITAL_COMMUNITY): Payer: 59

## 2011-09-15 ENCOUNTER — Emergency Department (HOSPITAL_COMMUNITY)
Admission: EM | Admit: 2011-09-15 | Discharge: 2011-09-15 | Disposition: A | Discharge status: Home / Self Care | Payer: 59 | Attending: Emergency Medicine | Admitting: Emergency Medicine

## 2011-09-15 DIAGNOSIS — R002 Palpitations: Secondary | ICD-10-CM | POA: Insufficient documentation

## 2011-09-15 DIAGNOSIS — Z79899 Other long term (current) drug therapy: Secondary | ICD-10-CM | POA: Insufficient documentation

## 2011-09-15 DIAGNOSIS — K5289 Other specified noninfective gastroenteritis and colitis: Secondary | ICD-10-CM | POA: Insufficient documentation

## 2011-09-15 DIAGNOSIS — R000 Tachycardia, unspecified: Secondary | ICD-10-CM | POA: Insufficient documentation

## 2011-09-15 DIAGNOSIS — IMO0002 Reserved for concepts with insufficient information to code with codable children: Secondary | ICD-10-CM | POA: Insufficient documentation

## 2011-09-15 DIAGNOSIS — K589 Irritable bowel syndrome without diarrhea: Secondary | ICD-10-CM | POA: Insufficient documentation

## 2011-09-15 DIAGNOSIS — F3289 Other specified depressive episodes: Secondary | ICD-10-CM | POA: Insufficient documentation

## 2011-09-15 DIAGNOSIS — F329 Major depressive disorder, single episode, unspecified: Secondary | ICD-10-CM | POA: Insufficient documentation

## 2011-09-15 DIAGNOSIS — J8410 Pulmonary fibrosis, unspecified: Secondary | ICD-10-CM | POA: Insufficient documentation

## 2011-09-15 DIAGNOSIS — F411 Generalized anxiety disorder: Secondary | ICD-10-CM | POA: Insufficient documentation

## 2011-09-15 DIAGNOSIS — F17200 Nicotine dependence, unspecified, uncomplicated: Secondary | ICD-10-CM | POA: Insufficient documentation

## 2011-09-15 LAB — COMPREHENSIVE METABOLIC PANEL
ALT: 21 U/L (ref 0–35)
Albumin: 3.4 g/dL — ABNORMAL LOW (ref 3.5–5.2)
BUN: 12 mg/dL (ref 6–23)
Calcium: 9.2 mg/dL (ref 8.4–10.5)
GFR calc Af Amer: >90 mL/min (ref >90)
Glucose, Bld: 89 mg/dL (ref 70–99)
Sodium: 140 mEq/L (ref 135–145)
Total Protein: 6.7 g/dL (ref 6.0–8.3)

## 2011-09-15 LAB — DIFFERENTIAL
Basophils Relative: 1 % (ref 0–1)
Eosinophils Absolute: 0.5 10*3/uL (ref 0.0–0.7)
Eosinophils Relative: 5 % (ref 0–5)
Lymphs Abs: 2.1 10*3/uL (ref 0.7–4.0)

## 2011-09-15 LAB — CBC
MCH: 34.2 pg — ABNORMAL HIGH (ref 26.0–34.0)
MCHC: 33.3 g/dL (ref 30.0–36.0)
MCV: 102.7 fL — ABNORMAL HIGH (ref 78.0–100.0)
Platelets: 242 10*3/uL (ref 150–400)
RBC: 3.39 MIL/uL — ABNORMAL LOW (ref 3.87–5.11)

## 2011-09-15 MED ORDER — SODIUM CHLORIDE 0.9 % IV BOLUS (SEPSIS)
1000.0000 mL | Freq: Once | INTRAVENOUS | Status: AC
  Administered 2011-09-15: 1000 mL via INTRAVENOUS

## 2011-09-15 MED ORDER — LORAZEPAM 2 MG/ML IJ SOLN
1.0000 mg | Freq: Once | INTRAMUSCULAR | Status: AC
  Administered 2011-09-15: 1 mg via INTRAVENOUS
  Filled 2011-09-15: qty 1

## 2011-09-15 NOTE — Discharge Instructions (Signed)
With your multiple medical problems, it is.  Important that you continue to have your condition managed by your primary care physician and her pulmonologist.  Your evaluation has resulted in a diagnosis of palpitations.  Palpitations may occur 2 to your pulmonary condition were to chronic steroid use.  Please make sure to discuss these considerations with your physicians tomorrow and to schedule appropriate followup care.

## 2011-09-15 NOTE — ED Provider Notes (Signed)
History     CSN: [id number]  Arrival date & time 09/15/11  1726   First MD Initiated Contact with Patient 09/15/11 1839      Chief Complaint  Patient presents with  . Palpitations    (Consider location/radiation/quality/duration/timing/severity/associated sxs/prior treatment) HPI The patient presents with concerns of palpitations.  She notes that she was admitted several weeks ago, found to have pulmonary fibrosis.  She states that she is currently taking steroids, and has been having persistent intermittent chest tightness, but until today no similar palpitations.  Over the course of the day she gradually become more uncomfortable due to palpitations.  She denies current chest pain, lightheadedness, nausea, vomiting, diarrhea.  She states that she continues to have mild dyspnea.  She has been seen by pulmonology, and is following with them.  They're no clear alleviating, exacerbating or sedating factors for these palpitations. Past Medical History  Diagnosis Date  . Collagenous colitis   . WPW (Wolff-Parkinson-White syndrome)   . Kidney stone   . Addison disease   . Thyroid disease   . Clostridium difficile diarrhea   . Anxiety and depression   . Chronic diarrhea   . Chronic back pain   . Drug-seeking behavior   . Irritable bowel syndrome (IBS)   . Anemia     Past Surgical History  Procedure Date  . Cholecystectomy   . Tubal ligation   . Lithotripsy   . Sphincterotomy   . Cardiac electrophysiology mapping and ablation     for WPW  . Video bronchoscopy 08/31/2011    Procedure: VIDEO BRONCHOSCOPY WITH FLUORO;  Surgeon: Storm Frisk, MD;  Location: [facility];  Service: Cardiopulmonary;  Laterality: N/A;    Family History  Problem Relation Age of Onset  . Prostate cancer Father   . Coronary artery disease Maternal Grandfather     History  Substance Use Topics  . Smoking status: Current Some Day Smoker -- 0.3 packs/day for 30 years    Types: Cigarettes  .  Smokeless tobacco: Never Used  . Alcohol Use: No    OB History    Grav Para Term Preterm Abortions TAB SAB Ect Mult Living   3 3              Review of Systems  Constitutional:       HPI  HENT:       HPI otherwise negative  Eyes: Negative.   Respiratory:       HPI, otherwise negative  Cardiovascular:       HPI, otherwise nmegative  Gastrointestinal: Negative for vomiting.  Genitourinary:       HPI, otherwise negative  Musculoskeletal:       HPI, otherwise negative  Skin: Negative.   Neurological: Negative for syncope.    Allergies  Ambien; Nsaids; and Zofran  Home Medications   Current Outpatient Rx  Name Route Sig Dispense Refill  . CLONAZEPAM 1 MG PO TABS Oral Take 1 mg by mouth 4 (four) times daily as needed. anxiety    . METOCLOPRAMIDE HCL 10 MG PO TABS Oral Take 10 mg by mouth 4 (four) times daily as needed. For nausea.    . ADULT MULTIVITAMIN W/MINERALS CH Oral Take 1 tablet by mouth daily.      Marland Kitchen NORTRIPTYLINE HCL 25 MG PO CAPS Oral Take 25 mg by mouth at bedtime.    . OXYCODONE-ACETAMINOPHEN 5-325 MG PO TABS Oral Take 2 tablets by mouth every 4 (four) hours as needed for pain. 14  tablet 0  . POTASSIUM CHLORIDE CRYS ER 20 MEQ PO TBCR Oral Take 2 tablets (40 mEq total) by mouth 2 (two) times daily.  0  . PREDNISONE 20 MG PO TABS  Take 20mg  twice daily for 5 days more then reduce to 20mg  AM 10mg  PM for 5 days then 10mg  twice daily until current supply gone then stop 42 tablet 0  . PROMETHAZINE HCL 12.5 MG PO TABS Oral Take 1 tablet by mouth every 6 (six) hours as needed. For nausea.    Marland Kitchen RANITIDINE HCL 150 MG PO CAPS Oral Take 1 capsule (150 mg total) by mouth daily. 30 capsule 0  . LEVOTHYROXINE SODIUM 50 MCG PO TABS Oral Take 50 mcg by mouth daily.        BP 134/100  Pulse 111  Temp 97.9 F (36.6 C) (Oral)  Resp 15  Wt 140 lb (63.504 kg)  SpO2 99%  LMP 09/15/2011  Physical Exam  Nursing note and vitals reviewed. Constitutional: She is oriented to  person, place, and time. She appears well-developed and well-nourished. No distress.  HENT:  Head: Normocephalic and atraumatic.  Eyes: Conjunctivae and EOM are normal.  Cardiovascular: Regular rhythm.  Tachycardia present.   Pulmonary/Chest: Effort normal. No stridor. No respiratory distress. She has decreased breath sounds.  Abdominal: She exhibits no distension.  Musculoskeletal: She exhibits no edema.  Neurological: She is alert and oriented to person, place, and time. No cranial nerve deficit.  Skin: Skin is warm and dry.  Psychiatric: She has a normal mood and affect.    ED Course  Procedures (including critical care time)   Labs Reviewed  CBC  DIFFERENTIAL  COMPREHENSIVE METABOLIC PANEL  T4, FREE  TSH   No results found.   No diagnosis found.  Cardiac:  121 st abnormal Pulse ox 100% ra, normal    Date: 09/15/2011  Rate: 108  Rhythm: sinus tachycardia  QRS Axis: normal  Intervals: normal  ST/T Wave abnormalities: nonspecific T wave changes  Conduction Disutrbances:none  Narrative Interpretation:   Old EKG Reviewed: unchanged ABNORMAL     Charts reviewed, with notation of multiple evaluations for somewhat similar complaints  11:22 PM HR 96 MDM  This 46 year old female presents with concerns of ongoing palpitations.  Notably, the patient has been admitted for palpitations, chest pain, abdominal pain, several times in the near recent past.  A review of the patient's chart demonstrates recent diagnosis of pulmonary fibrosis, with consideration of pneumonitis.  The patient is currently on chronic steroid therapy.  Given these considerations, tachycardia is not present.  The absence of dyspnea, hypoxia, distress is reassuring.  The patient's labs today were largely reassuring and her x-ray does not show notable new findings.  Given these results the patient was discharged with instructions to follow up with her pulmonologist in her primary care physician tomorrow to  insure appropriate ongoing care.  Gerhard Munch, MD 09/15/11 [phone]

## 2011-09-15 NOTE — ED Notes (Signed)
Patient transported to X-ray 

## 2011-09-15 NOTE — ED Notes (Signed)
MD at bedside. 

## 2011-09-15 NOTE — ED Notes (Signed)
Pt states "I was just released from here with fluid on my lungs but no explanation, am to see pulmonologist, but my heart is just pounding, didn't know if it was the prednisone or what"

## 2011-09-16 LAB — T4, FREE: Free T4: 0.96 ng/dL (ref 0.80–1.80)

## 2011-09-20 ENCOUNTER — Telehealth: Payer: Self-pay | Admitting: Critical Care Medicine

## 2011-09-20 ENCOUNTER — Ambulatory Visit (INDEPENDENT_AMBULATORY_CARE_PROVIDER_SITE_OTHER): Payer: 59 | Admitting: Pulmonary Disease

## 2011-09-20 ENCOUNTER — Encounter (HOSPITAL_BASED_OUTPATIENT_CLINIC_OR_DEPARTMENT_OTHER): Payer: Self-pay | Admitting: *Deleted

## 2011-09-20 ENCOUNTER — Emergency Department (HOSPITAL_BASED_OUTPATIENT_CLINIC_OR_DEPARTMENT_OTHER)
Admission: EM | Admit: 2011-09-20 | Discharge: 2011-09-20 | Disposition: A | Discharge status: Home / Self Care | Payer: 59 | Attending: Emergency Medicine | Admitting: Emergency Medicine

## 2011-09-20 ENCOUNTER — Encounter: Payer: Self-pay | Admitting: Pulmonary Disease

## 2011-09-20 ENCOUNTER — Ambulatory Visit: Payer: 59 | Admitting: Pulmonary Disease

## 2011-09-20 VITALS — BP 116/84 | HR 113 | Temp 98.0°F | Ht 64.0 in | Wt 133.0 lb

## 2011-09-20 DIAGNOSIS — Z9089 Acquired absence of other organs: Secondary | ICD-10-CM | POA: Insufficient documentation

## 2011-09-20 DIAGNOSIS — R0609 Other forms of dyspnea: Secondary | ICD-10-CM | POA: Insufficient documentation

## 2011-09-20 DIAGNOSIS — R079 Chest pain, unspecified: Secondary | ICD-10-CM | POA: Insufficient documentation

## 2011-09-20 DIAGNOSIS — Z87442 Personal history of urinary calculi: Secondary | ICD-10-CM | POA: Insufficient documentation

## 2011-09-20 DIAGNOSIS — E2749 Other adrenocortical insufficiency: Secondary | ICD-10-CM | POA: Insufficient documentation

## 2011-09-20 DIAGNOSIS — F3289 Other specified depressive episodes: Secondary | ICD-10-CM | POA: Insufficient documentation

## 2011-09-20 DIAGNOSIS — F411 Generalized anxiety disorder: Secondary | ICD-10-CM | POA: Insufficient documentation

## 2011-09-20 DIAGNOSIS — Z87891 Personal history of nicotine dependence: Secondary | ICD-10-CM | POA: Insufficient documentation

## 2011-09-20 DIAGNOSIS — Z8249 Family history of ischemic heart disease and other diseases of the circulatory system: Secondary | ICD-10-CM | POA: Insufficient documentation

## 2011-09-20 DIAGNOSIS — Z862 Personal history of diseases of the blood and blood-forming organs and certain disorders involving the immune mechanism: Secondary | ICD-10-CM | POA: Insufficient documentation

## 2011-09-20 DIAGNOSIS — E079 Disorder of thyroid, unspecified: Secondary | ICD-10-CM | POA: Insufficient documentation

## 2011-09-20 DIAGNOSIS — J679 Hypersensitivity pneumonitis due to unspecified organic dust: Secondary | ICD-10-CM

## 2011-09-20 DIAGNOSIS — Z8042 Family history of malignant neoplasm of prostate: Secondary | ICD-10-CM | POA: Insufficient documentation

## 2011-09-20 DIAGNOSIS — F329 Major depressive disorder, single episode, unspecified: Secondary | ICD-10-CM | POA: Insufficient documentation

## 2011-09-20 DIAGNOSIS — R0989 Other specified symptoms and signs involving the circulatory and respiratory systems: Secondary | ICD-10-CM | POA: Insufficient documentation

## 2011-09-20 LAB — TROPONIN I: Troponin I: <0.3 ng/mL (ref <0.30)

## 2011-09-20 NOTE — Discharge Instructions (Signed)
Follow up with your doctor as scheduled.

## 2011-09-20 NOTE — ED Provider Notes (Signed)
History     CSN: [id number]  Arrival date & time 09/20/11  1254   First MD Initiated Contact with Patient 09/20/11 1324      Chief Complaint  Patient presents with  . Shortness of Breath  . Chest Pain     HPI Pt c/o SOB and chest pain x 2 days seen [facility] for same yesterday CTA was done pt has results with her  Past Medical History  Diagnosis Date  . Collagenous colitis   . WPW (Wolff-Parkinson-White syndrome)   . Kidney stone   . Addison disease   . Thyroid disease   . Clostridium difficile diarrhea   . Anxiety and depression   . Chronic diarrhea   . Chronic back pain   . Drug-seeking behavior   . Irritable bowel syndrome (IBS)   . Anemia     Past Surgical History  Procedure Date  . Cholecystectomy   . Tubal ligation   . Lithotripsy   . Sphincterotomy   . Cardiac electrophysiology mapping and ablation     for WPW  . Video bronchoscopy 08/31/2011    Procedure: VIDEO BRONCHOSCOPY WITH FLUORO;  Surgeon: Storm Frisk, MD;  Location: [facility];  Service: Cardiopulmonary;  Laterality: N/A;    Family History  Problem Relation Age of Onset  . Prostate cancer Father   . Coronary artery disease Maternal Grandfather     History  Substance Use Topics  . Smoking status: Former Smoker -- 1.0 packs/day for 30 years    Types: Cigarettes    Quit date: 09/03/2011  . Smokeless tobacco: Never Used  . Alcohol Use: No    OB History    Grav Para Term Preterm Abortions TAB SAB Ect Mult Living   3 3              Review of Systems  All other systems reviewed and are negative.    Allergies  Ambien; Nsaids; and Zofran  Home Medications   Current Outpatient Rx  Name Route Sig Dispense Refill  . CLONAZEPAM 1 MG PO TABS Oral Take 1 mg by mouth 4 (four) times daily as needed. anxiety    . LEVOTHYROXINE SODIUM 50 MCG PO TABS Oral Take 50 mcg by mouth daily.      . ADULT MULTIVITAMIN W/MINERALS CH Oral Take 1 tablet by mouth daily.      Marland Kitchen NORTRIPTYLINE  HCL 25 MG PO CAPS Oral Take 25 mg by mouth at bedtime.    Marland Kitchen POTASSIUM CHLORIDE CRYS ER 20 MEQ PO TBCR Oral Take 2 tablets (40 mEq total) by mouth 2 (two) times daily.  0    BP 122/79  Pulse 79  Temp 98 F (36.7 C) (Oral)  Resp 18  Ht 5\' 4"  (1.626 m)  Wt 140 lb (63.504 kg)  BMI 24.03 kg/m2  SpO2 96%  LMP 09/15/2011  Physical Exam  Nursing note and vitals reviewed. Constitutional: She is oriented to person, place, and time. She appears well-developed and well-nourished. No distress.  HENT:  Head: Normocephalic and atraumatic.  Eyes: Pupils are equal, round, and reactive to light.  Neck: Normal range of motion.  Cardiovascular: Normal rate and intact distal pulses.         INTERPRETATION SINUS TACHYCARDIA ~ V-rate=109 PROBABLE LEFT ATRIAL ABNORMALITY ~ P   Interpretation: Borderline ECG  Pulmonary/Chest: No respiratory distress. She has no wheezes.  Abdominal: Normal appearance. She exhibits no distension.  Musculoskeletal: Normal range of motion.  Neurological: She is alert  and oriented to person, place, and time. No cranial nerve deficit.  Skin: Skin is warm and dry. No rash noted.  Psychiatric: She has a normal mood and affect. Her behavior is normal.    ED Course  Procedures (including critical care time)   Labs Reviewed  TROPONIN I   No results found.   1. Chest pain       MDM          Nelia Shi, MD 09/20/11 [phone]

## 2011-09-20 NOTE — Telephone Encounter (Signed)
Spoke with pt who c/o increased SOB and chest tightness. OV with KC at 4:15 today.

## 2011-09-20 NOTE — ED Notes (Signed)
Pt c/o SOB and chest pain x 2 days seen at [facility] for same yesterday CTA was done pt has results with her

## 2011-09-20 NOTE — Patient Instructions (Addendum)
You need to get on your hydrocortisone now, and take daily Your vital signs and oxygen level are stable today.  I will send a note to Dr. Delford Field today, and will call him in the morning so we can make a decision on how to proceed from here.

## 2011-09-20 NOTE — ED Notes (Signed)
Pt states she has appt with pulmonology tomorrow.

## 2011-09-20 NOTE — Assessment & Plan Note (Signed)
The patient has had some increased shortness of breath and also atypical chest pain over the weekend, and did have a CT [facility] that I am unfortunately unable to open on disc.  I have reviewed the report, and it sounds.  Similar to her CT chest done here in [place].  There was no acute cause for her chest pain by this CT report.  The patient currently is stable hemodynamically, and has excellent oxygenation.  She has no increased work of breathing.  Because of the very complicated nature of her illness, I have explained to her that I would like to discuss with her primary pulmonologist first thing in the morning.  I suspect she is going to need a thoracoscopic biopsy for definitive diagnosis.  I have tried to get her back on prednisone, but the patient refused this, and states that she cannot tolerate this medication any further.  I have told her that she must get back on hydrocortisone, and that adrenal insufficiency is a life-threatening illness if not treated properly.  I have also told her that if her condition worsens overnight that she is to go to the emergency room for evaluation.

## 2011-09-20 NOTE — Progress Notes (Signed)
  Subjective:    Patient ID: Dawn Frost, female    DOB: [birth year], 46 y.o.   MRN: [id number]  HPI The patient comes in today for an acute sick visit.  She is known to our practice with cavitary nodules and groundglass opacities that are presumed secondary to hypersensitivity pneumonitis.  She is being treated with prednisone, and at the last visit was being tapered.  The patient was unable to tolerate the prednisone because of severe shaking and anxiety, and therefore discontinued this on her own about 5 days ago.  Unfortunately, she did not restart on hydrocortisone for her known adrenal insufficiency.  She was in [place] this past weekend shopping with her daughter, when she began to develop increasing shortness of breath and atypical pain in her chest that went through to her back.  She went to the [facility] emergency room, where a CT scan of her chest was done, but unfortunately I am having difficulty opening her disc today.  I have reviewed the x-ray report, and have also compared it to her scan here, and they sound similar.  They asked her to followup with her usual pulmonologist.  The patient has continued to have her chest discomfort that is much improved with rest, but increases with exertion.  It does not sound anginal in nature.  She has had no cough or sputum production.  She continues to have dyspnea on exertion, but it is no worse compared to last weekend.  She has had no fevers, chills, or sweats.  Her vital signs are stable as is her oxygenation on room air.   Review of Systems  Constitutional: Negative.  Negative for fever and unexpected weight change.  HENT: Negative.  Negative for ear pain, nosebleeds, congestion, sore throat, rhinorrhea, sneezing, trouble swallowing, dental problem, postnasal drip and sinus pressure.   Eyes: Negative.  Negative for redness and itching.  Respiratory: Positive for cough, chest tightness and shortness of breath. Negative for wheezing.     Cardiovascular: Positive for chest pain. Negative for palpitations and leg swelling.  Gastrointestinal: Negative.  Negative for nausea and vomiting.  Genitourinary: Negative.  Negative for dysuria.  Musculoskeletal: Negative.  Negative for joint swelling.  Skin: Negative.  Negative for rash.  Neurological: Negative.  Negative for headaches.  Hematological: Negative.  Does not bruise/bleed easily.  Psychiatric/Behavioral: Negative for dysphoric mood. The patient is nervous/anxious.        Objective:   Physical Exam Well-developed female in no acute distress.  The patient has no increased work of breathing. Nose without purulence or discharge noted Oropharynx is clear Neck without masses or lymphadenopathy Chest with scattered crackles throughout but no wheezes Cardiac exam with mild regular tachycardia, no murmurs Lower extremities without edema, no cyanosis Alert and oriented, moves all  4 extremities       Assessment & Plan:

## 2011-09-21 ENCOUNTER — Ambulatory Visit (HOSPITAL_COMMUNITY): Payer: 59

## 2011-09-21 ENCOUNTER — Encounter (HOSPITAL_COMMUNITY): Payer: Self-pay | Admitting: Anesthesiology

## 2011-09-21 ENCOUNTER — Telehealth: Payer: Self-pay | Admitting: Pulmonary Disease

## 2011-09-21 ENCOUNTER — Inpatient Hospital Stay (HOSPITAL_COMMUNITY): Payer: 59

## 2011-09-21 ENCOUNTER — Ambulatory Visit (HOSPITAL_COMMUNITY)
Admission: AD | Admit: 2011-09-21 | Discharge: 2011-09-23 | Disposition: A | Discharge status: Home / Self Care | Payer: 59 | Source: Ambulatory Visit | Attending: Pulmonary Disease | Admitting: Pulmonary Disease

## 2011-09-21 ENCOUNTER — Encounter (HOSPITAL_COMMUNITY): Payer: Self-pay | Admitting: General Practice

## 2011-09-21 ENCOUNTER — Ambulatory Visit: Payer: 59 | Admitting: Critical Care Medicine

## 2011-09-21 DIAGNOSIS — Z87891 Personal history of nicotine dependence: Secondary | ICD-10-CM | POA: Insufficient documentation

## 2011-09-21 DIAGNOSIS — E2749 Other adrenocortical insufficiency: Secondary | ICD-10-CM | POA: Insufficient documentation

## 2011-09-21 DIAGNOSIS — J189 Pneumonia, unspecified organism: Secondary | ICD-10-CM

## 2011-09-21 DIAGNOSIS — R0789 Other chest pain: Secondary | ICD-10-CM | POA: Insufficient documentation

## 2011-09-21 DIAGNOSIS — E876 Hypokalemia: Secondary | ICD-10-CM

## 2011-09-21 DIAGNOSIS — K52832 Lymphocytic colitis: Secondary | ICD-10-CM

## 2011-09-21 DIAGNOSIS — Z862 Personal history of diseases of the blood and blood-forming organs and certain disorders involving the immune mechanism: Secondary | ICD-10-CM

## 2011-09-21 DIAGNOSIS — J679 Hypersensitivity pneumonitis due to unspecified organic dust: Secondary | ICD-10-CM

## 2011-09-21 DIAGNOSIS — K2900 Acute gastritis without bleeding: Secondary | ICD-10-CM

## 2011-09-21 DIAGNOSIS — F329 Major depressive disorder, single episode, unspecified: Secondary | ICD-10-CM | POA: Insufficient documentation

## 2011-09-21 DIAGNOSIS — R079 Chest pain, unspecified: Secondary | ICD-10-CM | POA: Diagnosis present

## 2011-09-21 DIAGNOSIS — R1110 Vomiting, unspecified: Secondary | ICD-10-CM

## 2011-09-21 DIAGNOSIS — K529 Noninfective gastroenteritis and colitis, unspecified: Secondary | ICD-10-CM

## 2011-09-21 DIAGNOSIS — M940 Chondrocostal junction syndrome [Tietze]: Secondary | ICD-10-CM

## 2011-09-21 DIAGNOSIS — E2740 Unspecified adrenocortical insufficiency: Secondary | ICD-10-CM | POA: Diagnosis present

## 2011-09-21 DIAGNOSIS — R0602 Shortness of breath: Secondary | ICD-10-CM | POA: Insufficient documentation

## 2011-09-21 DIAGNOSIS — R634 Abnormal weight loss: Secondary | ICD-10-CM

## 2011-09-21 DIAGNOSIS — I456 Pre-excitation syndrome: Secondary | ICD-10-CM

## 2011-09-21 DIAGNOSIS — F3289 Other specified depressive episodes: Secondary | ICD-10-CM | POA: Diagnosis present

## 2011-09-21 DIAGNOSIS — E039 Hypothyroidism, unspecified: Secondary | ICD-10-CM | POA: Diagnosis present

## 2011-09-21 DIAGNOSIS — F411 Generalized anxiety disorder: Secondary | ICD-10-CM | POA: Diagnosis present

## 2011-09-21 DIAGNOSIS — K52831 Collagenous colitis: Secondary | ICD-10-CM

## 2011-09-21 DIAGNOSIS — R197 Diarrhea, unspecified: Secondary | ICD-10-CM | POA: Insufficient documentation

## 2011-09-21 LAB — TSH: TSH: 0.607 u[IU]/mL (ref 0.350–4.500)

## 2011-09-21 LAB — COMPREHENSIVE METABOLIC PANEL
ALT: 24 U/L (ref 0–35)
Albumin: 3.3 g/dL — ABNORMAL LOW (ref 3.5–5.2)
BUN: 15 mg/dL (ref 6–23)
CO2: 23 mEq/L (ref 19–32)
Chloride: 105 mEq/L (ref 96–112)
Creatinine, Ser: 0.8 mg/dL (ref 0.50–1.10)
GFR calc Af Amer: >90 mL/min (ref >90)
Potassium: 3.5 mEq/L (ref 3.5–5.1)

## 2011-09-21 LAB — URINALYSIS, ROUTINE W REFLEX MICROSCOPIC
Bilirubin Urine: NEGATIVE
Hgb urine dipstick: NEGATIVE
Specific Gravity, Urine: 1.018 (ref 1.005–1.030)
Urobilinogen, UA: 0.2 mg/dL (ref 0.0–1.0)
pH: 6 (ref 5.0–8.0)

## 2011-09-21 LAB — RAPID URINE DRUG SCREEN, HOSP PERFORMED
Amphetamines: NOT DETECTED
Benzodiazepines: POSITIVE — AB
Cocaine: NOT DETECTED
Opiates: NOT DETECTED
Tetrahydrocannabinol: NOT DETECTED

## 2011-09-21 LAB — CARDIAC PANEL(CRET KIN+CKTOT+MB+TROPI): Troponin I: <0.3 ng/mL (ref <0.30)

## 2011-09-21 LAB — PROTIME-INR: INR: 0.93 (ref 0.00–1.49)

## 2011-09-21 LAB — CBC
HCT: 36.2 % (ref 36.0–46.0)
MCV: 103.4 fL — ABNORMAL HIGH (ref 78.0–100.0)
RBC: 3.5 MIL/uL — ABNORMAL LOW (ref 3.87–5.11)
RDW: 14.3 % (ref 11.5–15.5)
WBC: 7.5 10*3/uL (ref 4.0–10.5)

## 2011-09-21 MED ORDER — TRAMADOL HCL 50 MG PO TABS
50.0000 mg | ORAL_TABLET | Freq: Three times a day (TID) | ORAL | Status: DC | PRN
  Filled 2011-09-21: qty 1

## 2011-09-21 MED ORDER — NICOTINE 14 MG/24HR TD PT24
14.0000 mg | MEDICATED_PATCH | Freq: Every day | TRANSDERMAL | Status: DC
  Administered 2011-09-21 – 2011-09-23 (×3): 14 mg via TRANSDERMAL
  Filled 2011-09-21 (×3): qty 1

## 2011-09-21 MED ORDER — ACETAMINOPHEN 650 MG RE SUPP: 650.0000 mg | Freq: Four times a day (QID) | RECTAL | Status: DC | PRN

## 2011-09-21 MED ORDER — ALUM & MAG HYDROXIDE-SIMETH 200-200-20 MG/5ML PO SUSP
30.0000 mL | Freq: Four times a day (QID) | ORAL | Status: DC | PRN
  Filled 2011-09-21: qty 30

## 2011-09-21 MED ORDER — ALBUTEROL SULFATE (5 MG/ML) 0.5% IN NEBU: 2.5000 mg | INHALATION_SOLUTION | Freq: Four times a day (QID) | RESPIRATORY_TRACT | Status: DC | PRN

## 2011-09-21 MED ORDER — IOHEXOL 300 MG/ML  SOLN
80.0000 mL | Freq: Once | INTRAMUSCULAR | Status: AC | PRN
  Administered 2011-09-21: 80 mL via INTRAVENOUS

## 2011-09-21 MED ORDER — HEPARIN SODIUM (PORCINE) 5000 UNIT/ML IJ SOLN
5000.0000 [IU] | Freq: Three times a day (TID) | INTRAMUSCULAR | Status: DC
  Administered 2011-09-21 – 2011-09-23 (×6): 5000 [IU] via SUBCUTANEOUS
  Filled 2011-09-21 (×9): qty 1

## 2011-09-21 MED ORDER — CLONAZEPAM 1 MG PO TABS
1.0000 mg | ORAL_TABLET | Freq: Four times a day (QID) | ORAL | Status: DC | PRN
  Administered 2011-09-21 – 2011-09-23 (×4): 1 mg via ORAL
  Filled 2011-09-21 (×4): qty 1

## 2011-09-21 MED ORDER — NORTRIPTYLINE HCL 25 MG PO CAPS
25.0000 mg | ORAL_CAPSULE | Freq: Every day | ORAL | Status: DC
  Administered 2011-09-21 – 2011-09-22 (×2): 25 mg via ORAL
  Filled 2011-09-21 (×3): qty 1

## 2011-09-21 MED ORDER — SODIUM CHLORIDE 0.9 % IJ SOLN: 3.0000 mL | INTRAMUSCULAR | Status: DC | PRN

## 2011-09-21 MED ORDER — ACETAMINOPHEN 325 MG PO TABS: 650.0000 mg | ORAL_TABLET | Freq: Four times a day (QID) | ORAL | Status: DC | PRN

## 2011-09-21 MED ORDER — SODIUM CHLORIDE 0.9 % IV SOLN: 250.0000 mL | INTRAVENOUS | Status: DC | PRN

## 2011-09-21 MED ORDER — SODIUM CHLORIDE 0.9 % IJ SOLN
3.0000 mL | Freq: Two times a day (BID) | INTRAMUSCULAR | Status: DC
  Administered 2011-09-21 – 2011-09-23 (×5): 3 mL via INTRAVENOUS

## 2011-09-21 MED ORDER — LEVOTHYROXINE SODIUM 50 MCG PO TABS
50.0000 ug | ORAL_TABLET | Freq: Every day | ORAL | Status: DC
  Administered 2011-09-22 – 2011-09-23 (×2): 50 ug via ORAL
  Filled 2011-09-21 (×3): qty 1

## 2011-09-21 MED ORDER — HYDROCODONE-ACETAMINOPHEN 5-325 MG PO TABS
1.0000 | ORAL_TABLET | ORAL | Status: DC | PRN
  Administered 2011-09-21 – 2011-09-22 (×5): 2 via ORAL
  Filled 2011-09-21 (×5): qty 2

## 2011-09-21 NOTE — Progress Notes (Signed)
Patient c/o of mid-chest pain 7/10 and nausea; no vomiting.  MD notified, and orders received. Will continue to monitor

## 2011-09-21 NOTE — Telephone Encounter (Signed)
Talked with pt this am, after discussion with her primary pulmonologist.  Will admit her to hospital for VATS lung bx.  Pt aware.

## 2011-09-21 NOTE — Progress Notes (Signed)
09/21/2011  Patient is a direct admit from home to 6700. She is alert, oriented and ambulatory. Patient complain on having pain in right  shoulder and in the mid chest whenever she moves. Patient saturation is 96% RA. Patient skin is fine. On top of right foot she have a small scab area. Tattoos on lower back and right sacrum. Biochemist, clinical.

## 2011-09-21 NOTE — H&P (Signed)
Name: Dawn Frost MRN: [id number] DOB: May 27, 1965    LOS: 0  Referring Provider: Danise Mina Reason for Referral: Refractory SOB  PULMONARY / CRITICAL CARE MEDICINE  HPI:  46 yo WF 1 ppd smoker from age 38 till 09/03/11 who has a hx of bilateral infiltrates on CT scans, treated recently with prednisone for presumed hyprsensitivity pneumonitis without improvement from and unable to tolerate prednisone. Recently seen at [facility] with [facility] scan not changed per report. [facility] to perform lung bx. 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  Past Medical History  Diagnosis Date  . Collagenous colitis   . WPW (Wolff-Parkinson-White syndrome)   . Kidney stone   . Addison disease   . Thyroid disease   . Clostridium difficile diarrhea   . Anxiety and depression   . Chronic diarrhea   . Chronic back pain   . Drug-seeking behavior   . Irritable bowel syndrome (IBS)   . Anemia    Past Surgical History  Procedure Date  . Cholecystectomy   . Tubal ligation   . Lithotripsy   . Sphincterotomy   . Cardiac electrophysiology mapping and ablation     for WPW  . Video bronchoscopy 08/31/2011    Procedure: VIDEO BRONCHOSCOPY WITH FLUORO;  Surgeon: Storm Frisk, MD;  Location: [facility];  Service: Cardiopulmonary;  Laterality: N/A;   Prior to Admission medications   Medication Sig Start Date End Date Taking? Authorizing Provider  clonazePAM (KLONOPIN) 1 MG tablet Take 1 mg by mouth 4 (four) times daily as needed. anxiety    Historical Provider, MD  levothyroxine (SYNTHROID, LEVOTHROID) 50 MCG tablet Take 50 mcg by mouth daily.      Historical Provider, MD  Multiple Vitamin (MULITIVITAMIN WITH MINERALS) TABS Take 1 tablet by mouth daily.      Historical Provider, MD  nortriptyline (PAMELOR) 25 MG capsule Take 25 mg by mouth at bedtime.    Historical Provider, MD  potassium chloride SA (K-DUR,KLOR-CON) 20 MEQ tablet Take 2 tablets (40 mEq total) by mouth 2 (two)  times daily. 08/09/11 08/08/12  Clydia Llano, MD   Allergies Allergies  Allergen Reactions  . Ambien (Zolpidem Tartrate) Other (See Comments)    headache  . Nsaids Other (See Comments)    Bad for colitis  . Zofran Other (See Comments)    Headache     Family History Family History  Problem Relation Age of Onset  . Prostate cancer Father   . Coronary artery disease Maternal Grandfather    Social History  reports that she quit smoking about 2 weeks ago. Her smoking use included Cigarettes. She has a 30 pack-year smoking history. She has never used smokeless tobacco. She reports that she does not drink alcohol or use illicit drugs.  Review Of Systems:  Taken extensively see hpi.  Brief patient description:  WNWDWFNAD  Events Since Admission:   Current Status:  Vital Signs: Temp:  [97.9 F (36.6 C)-98 F (36.7 C)] 98 F (36.7 C) (06/18 1645) Pulse Rate:  [79-113] 113  (06/18 1645) Resp:  [18] 18  (06/18 1408) BP: (116-122)/(79-84) 116/84 mmHg (06/18 1645) SpO2:  [96 %-100 %] 98 % (06/18 1645) Weight:  [133 lb (60.328 kg)-140 lb (63.504 kg)] 133 lb (60.328 kg) (06/18 1645)  Physical Examination: General:  WNWDNAD Neuro:  Intact HEENT:  No adenopathy Neck:  No jvd Cardiovascular:  hsr rrr Lungs:  cta Abdomen:  Soft +bs Musculoskeletal:  intact Skin:  Warm,  no edema  Active Problems:  Generalized anxiety disorder  DEPRESSION  Adrenal insufficiency  Hypothyroid  Chest pain  Hypersensitivity pneumonitis   ASSESSMENT AND PLAN  PULMONARY    CXR:   No results found.    A:  SOB/ atypical chest pain P:   -admit -cvts consult for bx  CARDIOVASCULAR  Lab 09/20/11 1335  TROPONINI <0.30  LATICACIDVEN --  PROBNP --   ECG:   Lines:   A: Chest pain atypical P:  -check cardiac enzymes and 12 lead for completeness.   RENAL  Lab 09/15/11 2024  NA 140  K 3.7  CL 105  CO2 25  BUN 12  CREATININE 0.66  CALCIUM 9.2  MG --  PHOS --   Intake/Output     None    Foley:  none  A:  No acute issue P:     GASTROINTESTINAL  Lab 09/15/11 2024  AST 17  ALT 21  ALKPHOS 101  BILITOT 0.1*  PROT 6.7  ALBUMIN 3.4*    A:  Chronic diarrhea  P:   -maintain home meds  HEMATOLOGIC  Lab 09/15/11 2024  HGB 11.6*  HCT 34.8*  PLT 242  INR --  APTT --   A:  No acute issue P:    INFECTIOUS  Lab 09/15/11 2024  WBC 9.7  PROCALCITON --   Cultures:  Antibiotics:   A:  No indication for infectiosus proess P:     ENDOCRINE No results found for this basename: GLUCAP:5 in the last 168 hours A:  Hx of adrenal insuff. Off HC while on prednisone. Hypothyroid   P:   -check cortisol level -resume HC after cortisol level drawn -stress steroids if goes to surgery -home dose of synthroid NEUROLOGIC  A:  anxiety P:   Home meds  BEST PRACTICE / DISPOSITION Level of Care: floor Primary Service:  pulmonary Consultants:  cvts 6/19 Code Status:  full Diet:  reg DVT Px: mobile GI Px:  Not indicated Skin Integrity:  intact Social / Family:  none  Steve Minor ACNP [facility] Pollack PCCM Pager [phone] till 3 pm If no answer page [phone] 09/21/2011, 11:33 AM  Patient examined.  Records reviewed.  Assessment and plan edited and discussed with ACNP.  Orlean Bradford, M.D., F.C.C.P. Pulmonary and Critical Care Medicine [facility] Cell: [phone] Pager: [phone]

## 2011-09-21 NOTE — Progress Notes (Addendum)
eLink Physician-Brief Progress Note Patient Name: Dawn Frost DOB: Feb 19, 1966 MRN: [id number]  Date of Service  09/21/2011   HPI/Events of Note   Rn c/o pain in chest for patiient. Score 7 despite norco.   eICU Interventions  Ultram prmn  Addendum 6:30 PM Patient refused ultram due to nausea Has allergy to NSAID No more pain meds offered; advised RN to keep on schedule with norco Monitor   Intervention Category Intermediate Interventions: Pain - evaluation and management  Miguelangel Korn 09/21/2011, 4:52 PM

## 2011-09-21 NOTE — Telephone Encounter (Signed)
Spoke with patient-currently in hospital; feels like no one is giving her answers as to what is going on. I informed patient she needs to get the head nurse for the floor and speak with them about her concerns; if they feel she needs a pulmonary MD to come in then they can call our physicians there in the hospital to address her concerns/issues. Pt understands this and will speak with the head nurse. Will forward to [facility] as FYI that patient is hospital.

## 2011-09-21 NOTE — Consult Note (Signed)
CARDIOTHORACIC SURGERY CONSULTATION REPORT  PCP is Juan Quam, MD Referring Provider is Barbaraann Share., MD   Reason for consultation:  Lung biopsy  HPI:  Patient is a 46 year old female with history of tobacco use and multiple other medical problems who was hospitalized in May of this year with fevers chills and exertional shortness of breath.  Chest CT scan performed during that admission revealed diffuse pulmonary infiltrates. Bronchoscopy was performed and was nondiagnostic. The patient was ultimately discharged from the hospital on steroids for presumed hypersensitivity pneumonitis. She initially did well but apparently stopped taking the steroids because she was intolerant. She apparently was seen at [facility] in [place] this past weekend for exacerbation of similar symptoms and chest pain. Reportedly a CT scan was performed at that time demonstrating persistence of her pulmonary infiltrates (this scan is not currently available for review).  She was subsequently admitted to the hospital for possible lung biopsy and a thoracic surgical consultation has been requested.  Past Medical History  Diagnosis Date  . Collagenous colitis   . WPW (Wolff-Parkinson-White syndrome)   . Kidney stone   . Addison disease   . Thyroid disease   . Clostridium difficile diarrhea   . Anxiety and depression   . Chronic diarrhea   . Chronic back pain   . Drug-seeking behavior   . Irritable bowel syndrome (IBS)   . Anemia     Past Surgical History  Procedure Date  . Cholecystectomy   . Tubal ligation   . Lithotripsy   . Sphincterotomy   . Cardiac electrophysiology mapping and ablation     for WPW  . Video bronchoscopy 08/31/2011    Procedure: VIDEO BRONCHOSCOPY WITH FLUORO;  Surgeon: Storm Frisk, MD;  Location: [facility];  Service: Cardiopulmonary;  Laterality: N/A;    Family History  Problem Relation Age of Onset  . Prostate cancer Father   . Coronary  artery disease Maternal Grandfather     Social History History  Substance Use Topics  . Smoking status: Former Smoker -- 1.0 packs/day for 30 years    Types: Cigarettes    Quit date: 09/03/2011  . Smokeless tobacco: Never Used  . Alcohol Use: No    Prior to Admission medications   Medication Sig Start Date End Date Taking? Authorizing Provider  clonazePAM (KLONOPIN) 1 MG tablet Take 1 mg by mouth 4 (four) times daily as needed. For anxiety   Yes Historical Provider, MD  hydrocortisone (CORTEF) 10 MG tablet Take 10 mg by mouth daily.   Yes Historical Provider, MD  levothyroxine (SYNTHROID, LEVOTHROID) 50 MCG tablet Take 50 mcg by mouth daily.   Yes Historical Provider, MD  Multiple Vitamin (MULITIVITAMIN WITH MINERALS) TABS Take 1 tablet by mouth daily.     Yes Historical Provider, MD  nortriptyline (PAMELOR) 25 MG capsule Take 25 mg by mouth at bedtime.   Yes Historical Provider, MD  potassium chloride SA (K-DUR,KLOR-CON) 20 MEQ tablet Take 2 tablets (40 mEq total) by mouth 2 (two) times daily. 08/09/11 08/08/12 Yes Clydia Llano, MD  promethazine (PHENERGAN) 25 MG tablet Take 25 mg by mouth as needed. For nausea   Yes Historical Provider, MD    Current Facility-Administered Medications  Medication Dose Route Frequency Provider Last Rate Last Dose  . 0.9 %  sodium chloride infusion  250 mL Intravenous PRN Vilinda Blanks Minor, NP      . acetaminophen (TYLENOL) tablet 650 mg  650 mg Oral Q6H  PRN Vilinda Blanks Minor, NP       Or  . acetaminophen (TYLENOL) suppository 650 mg  650 mg Rectal Q6H PRN Vilinda Blanks Minor, NP      . albuterol (PROVENTIL) (5 MG/ML) 0.5% nebulizer solution 2.5 mg  2.5 mg Nebulization Q6H PRN Vilinda Blanks Minor, NP      . alum & mag hydroxide-simeth (MAALOX/MYLANTA) 200-200-20 MG/5ML suspension 30 mL  30 mL Oral Q6H PRN Vilinda Blanks Minor, NP      . clonazePAM (KLONOPIN) tablet 1 mg  1 mg Oral Q6H PRN Vilinda Blanks Minor, NP      . heparin injection 5,000 Units  5,000 Units Subcutaneous  Q8H William S Minor, NP   5,000 Units at 09/21/11 1341  . HYDROcodone-acetaminophen (NORCO) 5-325 MG per tablet 1-2 tablet  1-2 tablet Oral Q4H PRN Vilinda Blanks Minor, NP   2 tablet at 09/21/11 1841  . iohexol (OMNIPAQUE) 300 MG/ML solution 80 mL  80 mL Intravenous Once PRN Medication Radiologist, MD   80 mL at 09/21/11 1731  . levothyroxine (SYNTHROID, LEVOTHROID) tablet 50 mcg  50 mcg Oral QAC breakfast Vilinda Blanks Minor, NP      . nicotine (NICODERM CQ - dosed in mg/24 hours) patch 14 mg  14 mg Transdermal Daily Vilinda Blanks Minor, NP   14 mg at 09/21/11 1539  . nortriptyline (PAMELOR) capsule 25 mg  25 mg Oral QHS William S Minor, NP      . sodium chloride 0.9 % injection 3 mL  3 mL Intravenous Q12H William S Minor, NP      . sodium chloride 0.9 % injection 3 mL  3 mL Intravenous PRN Vilinda Blanks Minor, NP      . DISCONTD: traMADol (ULTRAM) tablet 50 mg  50 mg Oral Q8H PRN Kalman Shan, MD        Allergies  Allergen Reactions  . Ambien (Zolpidem Tartrate) Other (See Comments)    headache  . Nsaids Other (See Comments)    Bad for colitis  . Zofran Other (See Comments)    Headache     Review of Systems:  General:  normal appetite, normal energy   Respiratory:  no significant cough at all, no wheezing, no hemoptysis, + pain with inspiration or cough, + exertional shortness of breath   Cardiac:  + chest pain or tightness, + exertional SOB, no resting SOB, no PND, no orthopnea, no LE edema, no palpitations, no syncope  GI:   no difficulty swallowing, no hematochezia, no hematemesis, no melena, no constipation, + chronic diarrhea   GU:   no dysuria, no urgency, no frequency   Musculoskeletal: no arthritis, no arthralgia   Vascular:  no pain suggestive of claudication   Neuro:   no symptoms suggestive of TIA's, no seizures, no headaches, no peripheral neuropathy   Endocrine:  Negative   HEENT:  no loose teeth or painful teeth,  no recent vision changes  Psych:   no anxiety, no depression     Physical Exam:   BP 116/77  Pulse 90  Temp 98 F (36.7 C) (Oral)  Resp 17  Ht 5\' 4"  (1.626 m)  Wt 59.6 kg (131 lb 6.3 oz)  BMI 22.55 kg/m2  SpO2 99%  LMP 09/12/2011  General:    well-appearing  HEENT:  Unremarkable   Neck:   no JVD, no bruits, no adenopathy   Chest:   clear to auscultation, symmetrical breath sounds, no wheezes, no rhonchi   CV:   RRR,  no murmur   Abdomen:  soft, non-tender, no masses   Extremities:  warm, well-perfused, pulses palpable  Rectal/GU  Deferred  Neuro:   Grossly non-focal and symmetrical throughout  Skin:   Clean and dry, no rashes, no breakdown  Diagnostic Tests:   *RADIOLOGY REPORT*  Clinical Data: Follow-up bilateral pulmonary nodules and cavitary  lung lesion. Hypersensitivity pneumonitis. Smoker.  CT CHEST WITH CONTRAST  Technique: Multidetector CT imaging of the chest was performed  following the standard protocol during bolus administration of  intravenous contrast.  Contrast: 80mL OMNIPAQUE IOHEXOL 300 MG/ML SOLN  Comparison: 08/27/2011  Findings: There has been significant improvement in diffuse  heterogeneous patchy nodular pulmonary air space disease throughout  both lungs since previous study. A focal 1 cm lucency persists in  an area of airspace disease in the anterior right upper lobe on  image 14 which is slight increase in size since previous study and  could represent a small pneumatocele or focus of cavitation. No  new or worsening areas of pulmonary opacity are identified.  There is no evidence of pleural or pericardial effusion. Shotty  less than 1 cm mediastinal lymph nodes are again seen in the right  paratracheal and AP window regions, which show no significant  change. No pathologically enlarged lymph nodes or other soft  tissue masses are identified.  IMPRESSION:  1. Significant interval improvement in the bilateral diffuse  patchy and nodular air space disease. A 1 cm lucency in the  anterior right upper  lobe may represent a small pneumatocele verses  cavitary focus.  2. Stable shotty mediastinal lymph nodes. No pathologically  enlarged lymph nodes or other soft tissue masses identified.  Original Report Authenticated By: Danae Orleans, M.D.   Impression:  The chest CT scan which was performed here at [facility] this evening demonstrates nearly complete resolution of the diffuse patchy infiltrative process noted on the CT scan performed 4 weeks ago. Based upon the current CT scan I do not feel that lung biopsy is indicated at this time.   Plan:  We will defer subsequent plans for treatment to the primary care team. We would be happy to see the patient again in the future should the need arise.    Salvatore Decent. Cornelius Moras, MD 09/21/2011 7:43 PM

## 2011-09-21 NOTE — Progress Notes (Addendum)
eLink Physician-Brief Progress Note Patient Name: Dawn Frost DOB: 08/28/1965 MRN: [id number]  Date of Service  09/21/2011   HPI/Events of Note   Still c/o chronic chest pain   X-ray Chest Pa And Lateral   09/21/2011  *RADIOLOGY REPORT*  Clinical Data: 46 year old female with chest pain and shortness of breath.  CHEST - 2 VIEW  Comparison: 09/15/2011 and earlier.  Findings: Stable lung volumes.  Azygos fissure.  Cardiac size and mediastinal contours are within normal limits.  Visualized tracheal air column is within normal limits.  No pneumothorax, pulmonary edema, pleural effusion or consolidation.  Further decreased perihilar patchy opacity.  Residual now mostly in the right lung.  No areas of worsening ventilation or new airspace disease. No acute osseous abnormality identified.  IMPRESSION: Decreased perihilar pneumonia, residual patchy opacity on the right.  No new cardiopulmonary abnormality.  Original Report Authenticated By: Harley Hallmark, M.D.   Ct Chest W Contrast  09/21/2011  *RADIOLOGY REPORT*  Clinical Data: Follow-up bilateral pulmonary nodules and cavitary lung lesion.  Hypersensitivity pneumonitis.  Smoker.  CT CHEST WITH CONTRAST  Technique:  Multidetector CT imaging of the chest was performed following the standard protocol during bolus administration of intravenous contrast.  Contrast: 80mL OMNIPAQUE IOHEXOL 300 MG/ML  SOLN  Comparison: 08/27/2011  Findings: There has been significant improvement in diffuse heterogeneous patchy nodular pulmonary air space disease throughout both lungs since previous study.  A focal 1 cm lucency persists in an area of airspace disease in the anterior right upper lobe on image 14 which is slight increase in size since previous study and could represent a small pneumatocele or focus of cavitation.  No new or worsening areas of pulmonary opacity are identified.  There is no evidence of pleural or pericardial effusion.  Shotty less than 1 cm  mediastinal lymph nodes are again seen in the right paratracheal and AP window regions, which show no significant change.  No pathologically enlarged lymph nodes or other soft tissue masses are identified.  IMPRESSION:  1.  Significant interval improvement in the bilateral diffuse patchy and nodular air space disease.  A 1 cm lucency in the anterior right upper lobe may represent a small pneumatocele verses cavitary focus. 2.  Stable shotty mediastinal lymph nodes.  No pathologically enlarged lymph nodes or other soft tissue masses identified.  Original Report Authenticated By: Danae Orleans, M.D.     Lab 09/21/11 2001 09/20/11 1335  TROPONINI <0.30 <0.30     eICU Interventions  Repeat ekg and cxr  Addendum 10:46 PM  - no acute findings in cxr or ekg  - continue to monitor   Intervention Category Intermediate Interventions: Pain - evaluation and management  Xian Apostol 09/21/2011, 9:27 PM

## 2011-09-22 ENCOUNTER — Encounter (HOSPITAL_COMMUNITY)
Admission: AD | Disposition: A | Discharge status: Home / Self Care | Payer: Self-pay | Source: Ambulatory Visit | Attending: Pulmonary Disease

## 2011-09-22 ENCOUNTER — Ambulatory Visit (HOSPITAL_COMMUNITY): Payer: 59

## 2011-09-22 DIAGNOSIS — E039 Hypothyroidism, unspecified: Secondary | ICD-10-CM

## 2011-09-22 LAB — DIFFERENTIAL
Basophils Relative: 0 % (ref 0–1)
Lymphocytes Relative: 21 % (ref 12–46)
Monocytes Relative: 5 % (ref 3–12)
Neutro Abs: 4.7 10*3/uL (ref 1.7–7.7)
Neutrophils Relative %: 64 % (ref 43–77)

## 2011-09-22 LAB — CBC
Hemoglobin: 11.1 g/dL — ABNORMAL LOW (ref 12.0–15.0)
MCH: 33.1 pg (ref 26.0–34.0)
RBC: 3.35 MIL/uL — ABNORMAL LOW (ref 3.87–5.11)

## 2011-09-22 LAB — CARDIAC PANEL(CRET KIN+CKTOT+MB+TROPI)
Relative Index: INVALID (ref 0.0–2.5)
Troponin I: <0.3 ng/mL (ref <0.30)

## 2011-09-22 SURGERY — VIDEO ASSISTED THORACOSCOPY
Anesthesia: General

## 2011-09-22 MED ORDER — ALBUTEROL SULFATE (5 MG/ML) 0.5% IN NEBU
2.5000 mg | INHALATION_SOLUTION | Freq: Once | RESPIRATORY_TRACT | Status: AC
  Administered 2011-09-22: 2.5 mg via RESPIRATORY_TRACT

## 2011-09-22 MED ORDER — MORPHINE SULFATE 2 MG/ML IJ SOLN
2.0000 mg | INTRAMUSCULAR | Status: DC | PRN
  Administered 2011-09-22 (×4): 2 mg via INTRAVENOUS
  Administered 2011-09-23: 4 mg via INTRAVENOUS
  Administered 2011-09-23: 2 mg via INTRAVENOUS
  Filled 2011-09-22 (×3): qty 1
  Filled 2011-09-22 (×2): qty 2
  Filled 2011-09-22 (×2): qty 1

## 2011-09-22 MED ORDER — PANTOPRAZOLE SODIUM 40 MG PO TBEC
40.0000 mg | DELAYED_RELEASE_TABLET | Freq: Every day | ORAL | Status: DC
  Administered 2011-09-22 – 2011-09-23 (×2): 40 mg via ORAL
  Filled 2011-09-22 (×2): qty 1

## 2011-09-22 MED ORDER — HYDROCORTISONE 10 MG PO TABS
10.0000 mg | ORAL_TABLET | Freq: Every day | ORAL | Status: DC
  Administered 2011-09-22 – 2011-09-23 (×2): 10 mg via ORAL
  Filled 2011-09-22 (×2): qty 1

## 2011-09-22 SURGICAL SUPPLY — 49 items
CANISTER SUCTION 2500CC (MISCELLANEOUS) ×4 IMPLANT
CATH KIT ON Q 5IN SLV (PAIN MANAGEMENT) IMPLANT
CATH THORACIC 28FR (CATHETERS) IMPLANT
CATH THORACIC 36FR (CATHETERS) IMPLANT
CATH THORACIC 36FR RT ANG (CATHETERS) IMPLANT
CLOTH BEACON ORANGE TIMEOUT ST (SAFETY) ×2 IMPLANT
CONT SPEC 4OZ CLIKSEAL STRL BL (MISCELLANEOUS) ×4 IMPLANT
COVER SURGICAL LIGHT HANDLE (MISCELLANEOUS) ×4 IMPLANT
DRAPE CAMERA VIDEO/LASER (DRAPES) IMPLANT
DRAPE LAPAROSCOPIC ABDOMINAL (DRAPES) ×2 IMPLANT
ELECT REM PT RETURN 9FT ADLT (ELECTROSURGICAL) ×2
ELECTRODE REM PT RTRN 9FT ADLT (ELECTROSURGICAL) ×1 IMPLANT
GLOVE EUDERMIC 7 POWDERFREE (GLOVE) ×4 IMPLANT
GOWN PREVENTION PLUS XLARGE (GOWN DISPOSABLE) ×2 IMPLANT
GOWN STRL NON-REIN LRG LVL3 (GOWN DISPOSABLE) ×4 IMPLANT
KIT BASIN OR (CUSTOM PROCEDURE TRAY) ×2 IMPLANT
KIT ROOM TURNOVER OR (KITS) ×2 IMPLANT
NS IRRIG 1000ML POUR BTL (IV SOLUTION) ×4 IMPLANT
PACK CHEST (CUSTOM PROCEDURE TRAY) ×2 IMPLANT
PAD ARMBOARD 7.5X6 YLW CONV (MISCELLANEOUS) ×4 IMPLANT
SEALANT PROGEL (MISCELLANEOUS) IMPLANT
SEALANT SURG COSEAL 4ML (VASCULAR PRODUCTS) IMPLANT
SEALANT SURG COSEAL 8ML (VASCULAR PRODUCTS) IMPLANT
SOLUTION ANTI FOG 6CC (MISCELLANEOUS) IMPLANT
SPECIMEN JAR MEDIUM (MISCELLANEOUS) ×2 IMPLANT
SPONGE GAUZE 4X4 12PLY (GAUZE/BANDAGES/DRESSINGS) ×2 IMPLANT
SUT PROLENE 3 0 SH DA (SUTURE) IMPLANT
SUT PROLENE 4 0 RB 1 (SUTURE)
SUT PROLENE 4-0 RB1 .5 CRCL 36 (SUTURE) IMPLANT
SUT SILK  1 MH (SUTURE)
SUT SILK 1 MH (SUTURE) IMPLANT
SUT SILK 2 0SH CR/8 30 (SUTURE) IMPLANT
SUT VIC AB 1 CTX 36 (SUTURE)
SUT VIC AB 1 CTX36XBRD ANBCTR (SUTURE) IMPLANT
SUT VIC AB 2 TP1 27 (SUTURE) IMPLANT
SUT VIC AB 2-0 CT1 27 (SUTURE)
SUT VIC AB 2-0 CT1 TAPERPNT 27 (SUTURE) IMPLANT
SUT VIC AB 2-0 CTX 36 (SUTURE) IMPLANT
SUT VIC AB 3-0 SH 27 (SUTURE)
SUT VIC AB 3-0 SH 27X BRD (SUTURE) IMPLANT
SUT VIC AB 3-0 X1 27 (SUTURE) IMPLANT
SYSTEM SAHARA CHEST DRAIN ATS (WOUND CARE) ×2 IMPLANT
TIP APPLICATOR SPRAY EXTEND 16 (VASCULAR PRODUCTS) IMPLANT
TOWEL OR 17X24 6PK STRL BLUE (TOWEL DISPOSABLE) ×2 IMPLANT
TOWEL OR 17X26 10 PK STRL BLUE (TOWEL DISPOSABLE) ×2 IMPLANT
TRAP SPECIMEN MUCOUS 40CC (MISCELLANEOUS) IMPLANT
TRAY FOLEY CATH 14FRSI W/METER (CATHETERS) ×2 IMPLANT
TUNNELER SHEATH ON-Q 11GX8 (MISCELLANEOUS) IMPLANT
WATER STERILE IRR 1000ML POUR (IV SOLUTION) ×4 IMPLANT

## 2011-09-22 NOTE — Progress Notes (Signed)
Name: Dawn Frost MRN: [id number] DOB: 1965-04-19    LOS: 1  Referring Provider: Danise Mina Reason for Referral: Refractory SOB  PULMONARY / CRITICAL CARE MEDICINE  HPI:  46 yo WF 1 ppd smoker from age 62 till 09/03/11 who has a hx of bilateral infiltrates on CT scans, treated recently with prednisone for presumed hyprsensitivity pneumonitis without improvement from and unable to tolerate prednisone. Recently seen at [facility] with CT scan not changed per report. She is being admitted to [facility] for cvts to perform lung bx. 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   Brief patient description:  WNWDWFNAD  Events Since Admission:   Current Status: Has had continued (chronic) chest discomfort, rx with ibuprofen  Vital Signs: Temp:  [97.3 F (36.3 C)-98 F (36.7 C)] 97.3 F (36.3 C) (06/20 0501) Pulse Rate:  [89-97] 95  (06/20 0919) Resp:  [16-18] 16  (06/20 0919) BP: (100-121)/(70-86) 100/70 mmHg (06/20 0919) SpO2:  [96 %-100 %] 100 % (06/20 0919) Weight:  [61.4 kg (135 lb 5.8 oz)] 61.4 kg (135 lb 5.8 oz) (06/19 2021)  Physical Examination: General:  WNWDNAD Neuro:  Intact HEENT:  No adenopathy Neck:  No jvd Cardiovascular:  hsr rrr Lungs:  cta Abdomen:  Soft +bs Musculoskeletal:  intact Skin:  Warm, no edema  Active Problems:  Generalized anxiety disorder  DEPRESSION  Adrenal insufficiency  Hypothyroid  Chest pain  Hypersensitivity pneumonitis   ASSESSMENT AND PLAN  PULMONARY    CXR:   X-ray Chest Pa And Lateral   09/21/2011  *RADIOLOGY REPORT*  Clinical Data: 46 year old female with chest pain and shortness of breath.  CHEST - 2 VIEW  Comparison: 09/15/2011 and earlier.  Findings: Stable lung volumes.  Azygos fissure.  Cardiac size and mediastinal contours are within normal limits.  Visualized tracheal air column is within normal limits.  No pneumothorax, pulmonary edema, pleural effusion or consolidation.  Further decreased perihilar patchy opacity.   Residual now mostly in the right lung.  No areas of worsening ventilation or new airspace disease. No acute osseous abnormality identified.  IMPRESSION: Decreased perihilar pneumonia, residual patchy opacity on the right.  No new cardiopulmonary abnormality.  Original Report Authenticated By: Harley Hallmark, M.D.   Ct Chest W Contrast  09/21/2011  *RADIOLOGY REPORT*  Clinical Data: Follow-up bilateral pulmonary nodules and cavitary lung lesion.  Hypersensitivity pneumonitis.  Smoker.  CT CHEST WITH CONTRAST  Technique:  Multidetector CT imaging of the chest was performed following the standard protocol during bolus administration of intravenous contrast.  Contrast: 80mL OMNIPAQUE IOHEXOL 300 MG/ML  SOLN  Comparison: 08/27/2011  Findings: There has been significant improvement in diffuse heterogeneous patchy nodular pulmonary air space disease throughout both lungs since previous study.  A focal 1 cm lucency persists in an area of airspace disease in the anterior right upper lobe on image 14 which is slight increase in size since previous study and could represent a small pneumatocele or focus of cavitation.  No new or worsening areas of pulmonary opacity are identified.  There is no evidence of pleural or pericardial effusion.  Shotty less than 1 cm mediastinal lymph nodes are again seen in the right paratracheal and AP window regions, which show no significant change.  No pathologically enlarged lymph nodes or other soft tissue masses are identified.  IMPRESSION:  1.  Significant interval improvement in the bilateral diffuse patchy and nodular air space disease.  A 1 cm lucency in the anterior right upper lobe may represent a small  pneumatocele verses cavitary focus. 2.  Stable shotty mediastinal lymph nodes.  No pathologically enlarged lymph nodes or other soft tissue masses identified.  Original Report Authenticated By: Danae Orleans, M.D.   Dg Chest Port 1 View  09/21/2011  *RADIOLOGY REPORT*  Clinical  Data: Chest pain.  Recent episodes of shortness of breath. Ex-smoker.  PORTABLE CHEST - 1 VIEW  Comparison: Earlier today.  Findings: Stable cavitary lesion in the right upper lobe.  Minimal increase in prominence of the interstitial markings.  No pleural fluid.  Normal sized heart.  Unremarkable bones.  IMPRESSION:  1.  Interval possible minimal interstitial edema. 2.  Stable right upper lobe cavitary lesion.  Original Report Authenticated By: Darrol Angel, M.D.      A:  SOB/ atypical chest pain P:   - treat symptomatically -morphine ordered -add PPI for possible GERD component  A: B ground glass infiltrates with predominant RUL cavitary focus, has improved on serial CT scans with steroids (completed) although I can still see GGI and her RUL thick walled cavity. ? Whether this is HSP or possibly eosinophilic PNA P:  - Appreciate Dr Orvan July eval; will discuss case with him today, there still may be some utility in doing lung bx since there is residual GGI although diagnostic yield will be lower - I will obtain her films from [place] for direct comparison - will recheck serum IgE, HSP panel - full PFT (her outpt spiro showed AFL) - suspect she would benefit from FOB to obtain cx's, eval for HSP, sarcoid, eosinophilic PNA  CARDIOVASCULAR  Lab 09/22/11 0450 09/21/11 2001 09/20/11 1335  TROPONINI <0.30 <0.30 <0.30  LATICACIDVEN -- -- --  PROBNP -- -- --   ECG:   Lines:   A: Chest pain atypical, cardiac enzymes reassuring P:  - prn pain control - add PPI  RENAL  Lab 09/21/11 1318 09/15/11 2024  NA 139 140  K 3.5 3.7  CL 105 105  CO2 23 25  BUN 15 12  CREATININE 0.80 0.66  CALCIUM 9.0 9.2  MG -- --  PHOS -- --   Intake/Output      06/19 0701 - 06/20 0700 06/20 0701 - 06/21 0700   P.O. 240 240   Total Intake(mL/kg) 240 (3.9) 240 (3.9)   Net +240 +240        Urine Occurrence  1 x   Stool Occurrence  1 x    Foley:  none  A:  No acute issue P:      GASTROINTESTINAL  Lab 09/21/11 1318 09/15/11 2024  AST 17 17  ALT 24 21  ALKPHOS 93 101  BILITOT 0.2* 0.1*  PROT 6.4 6.7  ALBUMIN 3.3* 3.4*    A:  Chronic diarrhea  P:   -maintain home meds  HEMATOLOGIC  Lab 09/21/11 1318 09/15/11 2024  HGB 11.9* 11.6*  HCT 36.2 34.8*  PLT 284 242  INR 0.93 --  APTT 32 --   A:  No acute issue P:    INFECTIOUS  Lab 09/21/11 1318 09/15/11 2024  WBC 7.5 9.7  PROCALCITON -- --   Cultures:  Antibiotics:   A:  No indication for infectious process P:     ENDOCRINE No results found for this basename: GLUCAP:5 in the last 168 hours A:  Hx of Addison's and adrenal insuff. Was HC while on prednisone.      Hypothyroid   P:   -restart hydrocort 6/20 -stress steroids if goes to surgery -home dose of  synthroid  NEUROLOGIC  A:  anxiety P:   Home meds  BEST PRACTICE / DISPOSITION Level of Care: floor Primary Service:  pulmonary Consultants:  cvts 6/19 Code Status:  full Diet:  reg DVT Px: mobile GI Px:  Not indicated Skin Integrity:  intact Social / Family:  none  Levy Pupa, MD, PhD 09/22/2011, 11:43 AM [facility] [phone] or if no answer [phone]

## 2011-09-23 ENCOUNTER — Telehealth: Payer: Self-pay | Admitting: Pulmonary Disease

## 2011-09-23 DIAGNOSIS — F329 Major depressive disorder, single episode, unspecified: Secondary | ICD-10-CM

## 2011-09-23 DIAGNOSIS — R197 Diarrhea, unspecified: Secondary | ICD-10-CM

## 2011-09-23 LAB — IGE: IgE (Immunoglobulin E), Serum: 50 IU/mL (ref 0.0–180.0)

## 2011-09-23 MED ORDER — PANTOPRAZOLE SODIUM 40 MG PO TBEC: 40.0000 mg | DELAYED_RELEASE_TABLET | Freq: Every day | ORAL | Status: DC

## 2011-09-23 MED ORDER — ALBUTEROL SULFATE HFA 108 (90 BASE) MCG/ACT IN AERS: 2.0000 | INHALATION_SPRAY | Freq: Four times a day (QID) | RESPIRATORY_TRACT | Status: DC | PRN

## 2011-09-23 MED ORDER — NICOTINE 14 MG/24HR TD PT24: 1.0000 | MEDICATED_PATCH | Freq: Every day | TRANSDERMAL | Status: AC

## 2011-09-23 MED ORDER — HYDROCODONE-ACETAMINOPHEN 5-325 MG PO TABS: 1.0000 | ORAL_TABLET | ORAL | Status: AC | PRN

## 2011-09-23 MED ORDER — MORPHINE SULFATE 2 MG/ML IJ SOLN
2.0000 mg | INTRAMUSCULAR | Status: DC | PRN
  Administered 2011-09-23: 2 mg via INTRAVENOUS

## 2011-09-23 NOTE — Progress Notes (Signed)
Patient discharge home.  Discharge instructions given and explained.  All medication reviewed with patient.  Patient voiced understanding of all instructions.  Copies of all forms given.

## 2011-09-23 NOTE — Discharge Summary (Signed)
Physician Discharge Summary  Patient ID: Dawn Frost MRN: [id number] DOB/AGE: 46-10-1965 46 y.o.  Admit date: 09/21/2011 Discharge date: 09/23/2011  Problem List Active Problems:  Generalized anxiety disorder  DEPRESSION  Adrenal insufficiency  Hypothyroid  Chest pain  Hypersensitivity pneumonitis   Hospital Course:  46 yo WF 1 ppd smoker from age 47 till 09/03/11 who has a hx of new bilateral infiltrates on CT scan chest 5/25, treated by Dr Delford Field with prednisone for possible hyprsensitivity pneumonitis. Difficulty tolerating prednisone with tachycardia, anxiety, also evolving chest tightness and pain to back. Was seen for CP at [facility] and CT scan 6/16 showed improved but continued B infiltrates. Given this information she was admitted 6/19 by Dr Shelle Iron for consideration of open lung biopsy. Repeat CT 6/19 (with similar technique and protocol) showed that her B infiltrates had largely resolved with some subtle residual GGI and an improving but persistent RUL thick walled cavitary process. TCTS consultation was obtained from Dr Cornelius Moras and it was agreed that lung biopsy at this point after treatment would be low yield. Repeat IgE and HSP panel were sent to be compared with priors from her outpatient evaluation.   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 Given the AFL and chest symptoms, she will be tried on albuterol HFA prn to see if she responds. It is presumed that her AFL is due to smoking history, although certainly HSP, sarcoidosis, eosinophilic PNA could all cause this picture.   She continued to have chest pain through to her back while admitted, etiology unclear but suspicious for GERD since it worsened on prednisone even as her underlying lung process improved. She will be started on  protonix and discharged with low dose narcotic script for breakthrough pain. Follow up will be with Dr Delford Field.   Studies:  CT scan chest 6/16 [facility]) >> persistent B GGI With B base-predominant haziness that is likely atx. Residual nodules exist as well as RUL cavity with surrounding inflammation. Suspect much of the the differences between [facility] and [facility] scans are due to technique and poor breath size (Tiffinie Caillier read)   CT scan chest 6/19 >> Significant interval improvement in B diffuse and patchy nodular disease compared with 08/27/11 and 09/18/11, residual (but improved) 1cm RUL cavity, stable shotty mediastinal LAD (none of pathologic size).   PFT 09/22/11:  Cleda Daub: FEV1 2.21 (78% pred), FVC 3.76L (107% pred), ratio 59% >> both FEV1 and FVC decrease after BD. Flow volume loops suggest at least a component of VCD, especially on the post-BD trials.  Volumes: Hyperinflated based on the elevated RV  Diffusion: very mild decrease that corrects to the normal range when adjusted for her inhaled alveolar volume.   Vital Signs:  Temp: [97.7 F (36.5 C)-98.3 F (36.8 C)] 98.3 F (36.8 C) (06/21 0821)  Pulse Rate: [69-94] 94 (06/21 0821)  Resp: [17-18] 18 (06/21 0821)  BP: (94-103)/(61-77) 99/72 mmHg (06/21 0821)  SpO2: [98 %-100 %] 100 % (06/21 0821)  Weight: [60.4 kg (133 lb 2.5 oz)] 60.4 kg (133 lb 2.5 oz) (06/20 2020)  Physical Examination:  General: Anxious but no distress  Neuro: Intact  HEENT: No adenopathy  Neck: No jvd  Cardiovascular: hsr rrr  Lungs: Cta, no wheeze on forced exp  Abdomen: Soft +bs  Musculoskeletal: intact  Skin: Warm, no edema   ASSESSMENT  AND PLAN  PULMONARY  A: SOB/ atypical chest pain  P:  - treat symptomatically  - added PPI for possible GERD component  A: B ground glass infiltrates with predominant RUL cavitary focus, has improved on serial CT scans with steroids (completed) although I can still see GGI and her RUL thick walled cavity. ? 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 She may eventually need long-acting meds depending on her response to the albuterol.  CARDIOVASCULAR   Lab  09/22/11 1606  09/22/11 0450  09/21/11 2001  09/20/11 1335   TROPONINI  <0.30  <0.30  <0.30  <0.30   LATICACIDVEN  --  --  --  --   PROBNP  --  --  --  --    A: Chest pain atypical, cardiac enzymes reassuring  P:  - prn pain control  - add PPI and plan for this at discharge  GASTROINTESTINAL   Lab  09/21/11 1318   AST  17   ALT  24   ALKPHOS  93   BILITOT  0.2*   PROT  6.4   ALBUMIN  3.3*    A: Chronic diarrhea  P:  -maintain home meds  ENDOCRINE  No results found for this basename: GLUCAP:5 in the last 168 hours  A: Hx of Addison's and adrenal insuff. Was HC while on prednisone.  Hypothyroid  P:  -restarted home hydrocort 6/20  -home dose of synthroid  NEUROLOGIC  A: anxiety  P:  Home meds  Labs at discharge Lab Results  Component Value Date   CREATININE 0.80 09/21/2011   BUN 15 09/21/2011   NA 139 09/21/2011   K 3.5 09/21/2011   CL 105 09/21/2011   CO2 23 09/21/2011   Lab Results  Component Value Date   WBC 7.3 09/22/2011   HGB 11.1* 09/22/2011   HCT 33.9* 09/22/2011   MCV 101.2* 09/22/2011   PLT 301 09/22/2011   Lab Results  Component Value Date   ALT 24 09/21/2011   AST 17 09/21/2011   ALKPHOS 93 09/21/2011   BILITOT 0.2* 09/21/2011   Lab  Results  Component Value Date   INR 0.93 09/21/2011    Current radiology studies X-ray Chest Pa And Lateral   09/21/2011  *RADIOLOGY REPORT*  Clinical Data: 46 year old female with chest pain and shortness of breath.  CHEST - 2 VIEW  Comparison: 09/15/2011 and earlier.  Findings: Stable lung volumes.  Azygos fissure.  Cardiac size and mediastinal contours are within normal limits.  Visualized tracheal air column is within normal limits.  No pneumothorax, pulmonary edema, pleural effusion or consolidation.  Further decreased perihilar patchy opacity.  Residual now mostly in the right lung.  No areas of worsening ventilation or new airspace disease. No acute osseous abnormality identified.  IMPRESSION: Decreased perihilar pneumonia, residual patchy opacity on the right.  No  new cardiopulmonary abnormality.  Original Report Authenticated By: Harley Hallmark, M.D.   Ct Chest W Contrast  09/21/2011  *RADIOLOGY REPORT*  Clinical Data: Follow-up bilateral pulmonary nodules and cavitary lung lesion.  Hypersensitivity pneumonitis.  Smoker.  CT CHEST WITH CONTRAST  Technique:  Multidetector CT imaging of the chest was performed following the standard protocol during bolus administration of intravenous contrast.  Contrast: 80mL OMNIPAQUE IOHEXOL 300 MG/ML  SOLN  Comparison: 08/27/2011  Findings: There has been significant improvement in diffuse heterogeneous patchy nodular pulmonary air space disease throughout both lungs since previous study.  A focal 1 cm lucency persists in an area of airspace disease in the anterior right upper lobe on image 14 which is slight increase in size since previous study and could represent a small pneumatocele or focus of cavitation.  No new or worsening areas of pulmonary opacity are identified.  There is no evidence of pleural or pericardial effusion.  Shotty less than 1 cm mediastinal lymph nodes are again seen in the right paratracheal and AP window regions, which show no significant  change.  No pathologically enlarged lymph nodes or other soft tissue masses are identified.  IMPRESSION:  1.  Significant interval improvement in the bilateral diffuse patchy and nodular air space disease.  A 1 cm lucency in the anterior right upper lobe may represent a small pneumatocele verses cavitary focus. 2.  Stable shotty mediastinal lymph nodes.  No pathologically enlarged lymph nodes or other soft tissue masses identified.  Original Report Authenticated By: Danae Orleans, M.D.   Dg Chest Port 1 View  09/21/2011  *RADIOLOGY REPORT*  Clinical Data: Chest pain.  Recent episodes of shortness of breath. Ex-smoker.  PORTABLE CHEST - 1 VIEW  Comparison: Earlier today.  Findings: Stable cavitary lesion in the right upper lobe.  Minimal increase in prominence of the interstitial markings.  No pleural fluid.  Normal sized heart.  Unremarkable bones.  IMPRESSION:  1.  Interval possible minimal interstitial edema. 2.  Stable right upper lobe cavitary lesion.  Original Report Authenticated By: Darrol Angel, M.D.    Disposition:  01-Home or Self Care  Discharge Orders    Future Appointments: Provider: Department: Dept Phone: Center:   10/03/2011 9:30 AM Storm Frisk, MD [facility] [phone] None   10/24/2011 9:00 AM Storm Frisk, MD [facility] [phone] None     Future Orders Please Complete By Expires   Discharge patient        Medication List  As of 09/23/2011 11:39 AM   STOP taking these medications         potassium chloride SA 20 MEQ tablet         TAKE these medications         clonazePAM 1 MG tablet   Commonly known as: KLONOPIN   Take 1 mg by mouth 4 (four) times daily as needed. For anxiety      HYDROcodone-acetaminophen 5-325 MG per tablet   Commonly known as: NORCO   Take 1-2 tablets by mouth every 4 (four) hours as needed.      hydrocortisone 10 MG tablet   Commonly known as: CORTEF   Take 10 mg by mouth daily.      levothyroxine 50 MCG tablet    Commonly known as: SYNTHROID, LEVOTHROID   Take 50 mcg by mouth daily.      multivitamin with minerals Tabs   Take 1 tablet by mouth daily.      nicotine 14 mg/24hr  patch   Commonly known as: NICODERM CQ - dosed in mg/24 hours   Place 1 patch onto the skin daily.      nortriptyline 25 MG capsule   Commonly known as: PAMELOR   Take 25 mg by mouth at bedtime.      pantoprazole 40 MG tablet   Commonly known as: PROTONIX   Take 1 tablet (40 mg total) by mouth daily.      promethazine 25 MG tablet   Commonly known as: PHENERGAN   Take 25 mg by mouth as needed. For nausea           Follow-up Information    Follow up with Shan Levans, MD on 10/03/2011. (9:30 am)    Contact information:   [address] [facility] [address] [place] [address] [place] [address] [phone]          Discharged Condition: good Vital signs at Discharge. Temp:  [97.7 F (36.5 C)-98.3 F (36.8 C)] 98.3 F (36.8 C) (06/21 1031) Pulse Rate:  [69-94] 89  (06/21 1031) Resp:  [17-18] 18  (06/21 1031) BP: (94-103)/(61-78) 102/78 mmHg (06/21 1031) SpO2:  [96 %-100 %] 96 % (06/21 1031) Weight:  [133 lb 2.5 oz (60.4 kg)] 133 lb 2.5 oz (60.4 kg) (06/20 2020) Office follow up Special Information or instructions. 1. She will follow up with Dr. Delford Field. 2. She will not return to work until Dr. Delford Field releases her as she works with [employer] and will need a written release to return to work. 3. No driving while taking narcotics.  4.  No surgicla intervention due to disease improve. 5. She has serial CT's of chest from multiple hospitals. FYI. Signed: Brett Canales Minor ACNP [facility] Pager [phone] till 3 pm If no answer page [phone] 09/23/2011, 11:39 AM   Levy Pupa, MD, PhD 09/23/2011, 5:56 PM [facility] [phone] or if no answer [phone]

## 2011-09-23 NOTE — Telephone Encounter (Signed)
[facility] called triage and advised to close this phone message.

## 2011-09-23 NOTE — Progress Notes (Signed)
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 Difficulty tolerating prednisone with tachycardia, anxiety, also evolving chest tightness and pain to back. Was seen for CP at [facility] and CT scan 6/16 showed improved but continued B infiltrates. Given this information she was admitted 6/19 by Dr Shelle Iron for consideration of open lung biopsy. Repeat CT 6/19 (with similar technique and protocol) showed that her B infiltrates had largely resolved with some subtle residual GGI and an improving but persistent RUL thick walled cavitary process. TCTS consultation was obtained from Dr Cornelius Moras and it was agreed that lung biopsy at this point after treatment would be low yield. Repeat IgE and HSP panel were sent to be compared with priors from her outpatient evaluation.   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 Given the AFL and chest symptoms, she will be tried on albuterol HFA prn to see if she responds. It is presumed that her AFL is due to smoking history, although certainly HSP, sarcoidosis, eosinophilic PNA could all cause this picture.   She continued to have chest pain through to her back while admitted, etiology unclear but suspicious for GERD since it worsened on prednisone even as her underlying lung process improved. She will be started on protonix and discharged with low dose narcotic script for breakthrough pain. Follow up will be with Dr Delford Field.   Studies:  CT scan chest 6/16 [facility]) >> persistent B GGI  With B base-predominant  haziness that is likely atx. Residual nodules exist as well as RUL cavity with surrounding inflammation. Suspect much of the the differences between [facility] and [facility] scans are due to technique and poor breath size  (Dawn Frost read) CT scan chest 6/19 >> Significant interval improvement in B diffuse and patchy nodular disease compared with 08/27/11 and 09/18/11, residual (but improved) 1cm RUL cavity, stable shotty mediastinal LAD (none of pathologic size).  PFT 09/22/11:   Dawn Frost: FEV1 2.21 (78% pred), FVC 3.76L (107% pred), ratio 59% >> both FEV1 and FVC decrease after BD. Flow volume loops suggest at least a component of VCD, especially on the post-BD trials.   Volumes: Hyperinflated based on the elevated RV  Diffusion: very mild decrease that corrects to the normal range when adjusted for her inhaled alveolar volume.    Vital Signs: Temp:  [97.7 F (36.5 C)-98.3 F (36.8 C)] 98.3 F (36.8 C) (06/21 0821) Pulse Rate:  [69-94] 94  (06/21 0821) Resp:  [17-18] 18  (06/21 0821) BP: (94-103)/(61-77) 99/72 mmHg (06/21 0821) SpO2:  [98 %-100 %] 100 % (06/21 0821) Weight:  [60.4 kg (133 lb 2.5 oz)] 60.4 kg (133 lb 2.5 oz) (06/20 2020)  Physical Examination: General: Anxious but no distress Neuro:  Intact HEENT:  No adenopathy Neck:  No jvd Cardiovascular:  hsr rrr Lungs:  Cta, no wheeze on forced exp Abdomen:  Soft +bs Musculoskeletal:  intact Skin:  Warm, no edema  ASSESSMENT AND PLAN  PULMONARY  A:  SOB/ atypical chest pain P:   - treat symptomatically - added PPI for possible  GERD component  A: B ground glass infiltrates with predominant RUL cavitary focus, has improved on serial CT scans with steroids (completed) although I can still see GGI and her RUL thick walled cavity. ? 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 She may eventually need long-acting meds depending on her response to the albuterol.  CARDIOVASCULAR  Lab 09/22/11 1606 09/22/11 0450 09/21/11 2001 09/20/11 1335  TROPONINI <0.30 <0.30 <0.30 <0.30  LATICACIDVEN -- -- -- --  PROBNP -- -- -- --    A: Chest pain atypical, cardiac enzymes reassuring P:  - prn pain control - add PPI and plan for this at discharge  GASTROINTESTINAL  Lab 09/21/11 1318  AST 17  ALT 24  ALKPHOS 93  BILITOT 0.2*  PROT 6.4  ALBUMIN 3.3*    A:  Chronic diarrhea  P:   -maintain home meds   ENDOCRINE No results found for this basename: GLUCAP:5 in the last 168 hours A:  Hx of Addison's and adrenal insuff. Was HC while on prednisone.      Hypothyroid P:   -restarted home hydrocort 6/20 -home dose of synthroid  NEUROLOGIC  A:  anxiety P:   Home meds  Levy Pupa, MD, PhD 09/23/2011, 10:27 AM [facility] [phone] or if no answer [phone]

## 2011-09-23 NOTE — Progress Notes (Signed)
Patient c/o pain 7/10, refuses Norco PO "It does not work" .  I informed patient that her  Blood pressure of 96/68 is to low for me to give her 4mg  of Morphine.  Call placed to Dr. Delton Coombes and informed of the patient situation, new orders received to hold morphine for Sys. blood pressure less than 100.  Patient very upset crying, again offered patient Norco and patient continues to refuse.

## 2011-09-23 NOTE — Discharge Instructions (Signed)
Addison's Disease Addison's disease is a glandular (endocrine) or hormonal disorder. It is also called adrenal insufficiency or hypocortisolism. It affects about 1 in 100,000 people. It can affect men and women in all age groups. This disease occurs when the adrenal glands do not make enough of the hormone cortisol. In some cases, the adrenal glands also do not make the hormone aldosterone. Without the right levels of these hormones, your body cannot maintain critical life functions. The adrenal glands are located just above the kidneys. Cortisol is a steroid hormone. Its most important job is to help the body respond to stress. Cortisol also helps the body to:  Maintain blood pressure and heart(cardiovascular) function.   Slow the immune system's response to inflammation.   Control the use of proteins, carbohydrates (sugars), and fats.   Maintain a sense of well-being.  CAUSES  A lack of cortisol can happen for different reasons.   Primary adrenal insufficiency is Addison's disease. The adrenal glands do not produce enough, or any, cortisol. Usually, aldosterone is also not produced.   Secondary adrenal insufficiency. The pituitary gland may not make enough of a hormone called ACTH (adrenocorticotropin). This hormone causes the adrenal glands to produce cortisol. Usually, there is enough aldosterone.  Primary Adrenal Insufficiencycan be caused by:  Autoimmune disease. Your body can produce antibodies that attack its own organs (in this case, your adrenal glands). The reasons why this happens are not well understood at this time. Sometimes, other organs are also affected (the polyglandular autoimmune syndromes).   An infection of the adrenal glands. Possible causes include tuberculosis, viruses (including HIV), and fungal infections.  Secondary Adrenal Insufficiency This form is much more common than the primary form. It can be traced to a lack of ACTH. Without ACTH, the adrenal glands cannot  make cortisol. Causes include:  Diseases of the pituitary gland. This is a small gland in the brain that controls many important body functions. The pituitary gland produces ACTH, among other hormones. Pituitary gland tumors, injury, or surgery can cause inadequate ACTH production, which causes inadequate cortisol production.   Medications:   Megestrol (used for cancer treatment and to stimulate appetite) and some pain medications can impair production of cortisol.   Use of cortisol medication (steroids) causes your adrenal glands to not produce cortisol. When you stop this medication, it can take time for the adrenal glands to start producing cortisol again. This can be a dangerous situation, requiring slowly reducing your cortisol medicine.  SYMPTOMS  Symptoms of adrenal insufficiency normally begin slowly. Problems seen with the disease are:  Severe tiredness (fatigue).   Muscle weakness.   Loss of appetite.   Weight loss.  About 50% of the time, the following symptoms occur:   Nausea, vomiting and diarrhea.   Drops in blood pressure.   Dizziness or fainting.   Darkening of the skin (with primary disease only).   Being easily angered (irritable).   Depression.   Salt craving.   Low blood sugar (hypoglycemia).   Irregular or no menstrual periods.  The symptoms slowly get worse. They are often ignored until a stressful event like an illness or an accident occurs. This is called an Addisonian crisis, or acute adrenal insufficiency. Without treatment, an Addisonian crisis can cause death. Symptoms of an Addisonian crisis include:  Sudden, severe pain in the lower back, abdomen, or legs.   Severe vomiting and diarrhea.   Dehydration.   Low blood pressure.   Loss of consciousness.  DIAGNOSIS  In its  early stages, adrenal insufficiency can be hard to diagnose. Your caregiver will need to:  Review your medical history.   Review your symptoms, such as dark tanning of  the skin.   Perform lab tests. Results will show if levels of cortisol are too low, and will help identify the cause. CT scan or MRI scan of the adrenal and pituitary glands may also be necessary.  TREATMENT  With proper treatment, you can live a normal life with Addison's disease or adrenal insufficiency.  Missing hormones need to be replaced in order to treat Addison's disease. Cortisol is replaced with hydrocortisone tablets taken by mouth. Other forms of cortisol may also be used. Since cortisone levels normally are higher in the morning and lower in the evening, you may need different doses at different times of the day. Be sure to follow your instructions carefully.   If your body cannot maintain the right levels of salt (sodium) and fluids because of too little aldosterone, you will also be given a medication. This drug replaces aldosterone.  Important points about treatment:  Any sudden (acute) illness can increase your body's need for cortisol. Surgery, or other stress on the body, can do the same. If you have Addison's disease and you are ill or having surgery, you will need an increase in hormone medication to prevent an Addisonian crisis. Untreated, an Addisonian crisis can cause death.   If you are too ill to take your medication or you cannot keep it down, you must take medicine through a shot (injection). You or someone who lives with you will need to learn how to give you this injection. The shot will take the place of hydrocortisone. If you find it necessary to give yourself injectable medication, call your caregiver right away, or go to the nearest hospital emergency room.  HOME CARE INSTRUCTIONS   Always carry an identification card stating your condition in case of an emergency. The card should:   Alert emergency personnel about the need to inject 100 mg of cortisol if you are severely hurt or cannot respond.   Include your caregiver's name and phone number.   Include the  name and number of your closest relative to contact.   When traveling, carry a needle, syringe, and an injectable form of cortisol for emergencies.   Know how to increase medication during periods of stress or mild colds.   Wear a warning bracelet or neck chain to alert emergency personnel. Many companies sell medical ID products.   Take your medications as prescribed. Do not stop your medications without medical supervision.   Learn about your condition. Ask your caregiver for further resources. This is a potentially dangerous, but easily managed illness.  SEEK MEDICAL CARE IF:   You have Addison's disease and you are in need of surgery.   You have Addison's disease and you have an acute illness.   You have weakness, weight loss, or other unexplained symptoms.  SEEK IMMEDIATE MEDICAL CARE IF:   You have symptoms of crisis:   Sudden, severe pain in the lower back, abdomen, or legs.   Severe vomiting and diarrhea.   Dehydration.   Low blood pressure.   Loss of consciousness.   You are experiencing severe:   Infections or other illness.   Vomiting.   Diarrhea.  Document Released: 03/21/2005 Document Revised: 03/10/2011 Document Reviewed: 08/12/2008 [facility] Patient Information 2012 [place], [place].

## 2011-09-23 NOTE — Telephone Encounter (Signed)
I spoke with pt and she states she is confused about everything. She was told she was being admitted to have surgery and now she is being told this isn't going to happen. She states she has spoken with the nurses over in the hospital and keeps being told they do not know what is going on. Pt wants to know what is going on bc no one is telling her anything. Pt asked if I would send this over to [facility] to see if he would talk with her since PW is off. Please advise KC thanks

## 2011-09-26 LAB — FUNGUS CULTURE W SMEAR: Special Requests: NORMAL

## 2011-09-28 LAB — HYPERSENSITIVITY PNUEMONITIS PROFILE

## 2011-10-03 ENCOUNTER — Ambulatory Visit (INDEPENDENT_AMBULATORY_CARE_PROVIDER_SITE_OTHER): Payer: 59 | Admitting: Critical Care Medicine

## 2011-10-03 ENCOUNTER — Ambulatory Visit (INDEPENDENT_AMBULATORY_CARE_PROVIDER_SITE_OTHER)
Admission: RE | Admit: 2011-10-03 | Discharge: 2011-10-03 | Disposition: A | Discharge status: Home / Self Care | Payer: 59 | Source: Ambulatory Visit | Attending: Critical Care Medicine | Admitting: Critical Care Medicine

## 2011-10-03 ENCOUNTER — Encounter: Payer: Self-pay | Admitting: Critical Care Medicine

## 2011-10-03 VITALS — BP 102/76 | HR 105 | Temp 98.3°F | Ht 64.0 in | Wt 134.4 lb

## 2011-10-03 DIAGNOSIS — J189 Pneumonia, unspecified organism: Secondary | ICD-10-CM

## 2011-10-03 DIAGNOSIS — J45909 Unspecified asthma, uncomplicated: Secondary | ICD-10-CM

## 2011-10-03 DIAGNOSIS — J679 Hypersensitivity pneumonitis due to unspecified organic dust: Secondary | ICD-10-CM

## 2011-10-03 MED ORDER — BUDESONIDE-FORMOTEROL FUMARATE 160-4.5 MCG/ACT IN AERO: 2.0000 | INHALATION_SPRAY | Freq: Two times a day (BID) | RESPIRATORY_TRACT | Status: DC

## 2011-10-03 NOTE — Progress Notes (Signed)
Subjective:    Patient ID: Dawn Frost, female    DOB: March 26, 1966, 46 y.o.   MRN: [id number] Hx on Dc as below HPI 46 y.o.   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 Difficulty tolerating prednisone with tachycardia, anxiety, also evolving chest tightness and pain to back. Was seen for CP at [facility] and CT scan 6/16 showed improved but continued B infiltrates. Given this information she was admitted 6/19 by Dr Shelle Iron for consideration of open lung biopsy. Repeat CT 6/19 (with similar technique and protocol) showed that her B infiltrates had largely resolved with some subtle residual GGI and an improving but persistent RUL thick walled cavitary process. TCTS consultation was obtained from Dr Cornelius Moras and it was agreed that lung biopsy at this point after treatment would be low yield. Repeat IgE and HSP panel were sent to be compared with priors from her outpatient evaluation.   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 Given the AFL and chest symptoms, she will be tried on albuterol HFA prn to see if she responds. It is presumed that her AFL is due to smoking history, although certainly HSP, sarcoidosis, eosinophilic PNA could all cause this picture.  She continued to have chest pain through to her back while admitted, etiology unclear but suspicious for GERD since it worsened on prednisone even as her underlying lung process improved. She will be started on protonix and discharged with low dose narcotic script for breakthrough pain. Follow up will be with Dr Delford Field.   Studies:  CT scan chest 6/16 [facility]) >> persistent B GGI With B base-predominant haziness that  is likely atx. Residual nodules exist as well as RUL cavity with surrounding inflammation. Suspect much of the the differences between [facility] and [facility] scans are due to technique and poor breath size ([place] read)  CT scan chest 6/19 >> Significant interval improvement in B diffuse and patchy nodular disease compared with 08/27/11 and 09/18/11, residual (but improved) 1cm RUL cavity, stable shotty mediastinal LAD (none of pathologic size).  PFT 09/22/11:  Cleda Daub: FEV1 2.21 (78% pred), FVC 3.76L (107% pred), ratio 59% >> both FEV1 and FVC decrease after BD. Flow volume loops suggest at least a component of VCD, especially on the post-BD trials.  Volumes: Hyperinflated based on the elevated RV  Diffusion: very mild decrease that corrects to the normal range when adjusted for her inhaled alveolar volume.   10/03/2011 Pt in hospital 6/18- 6/19.  In ED 6/16. Pt still with chest pain. Pain is is uncomfortable. Pain is like a squeeze and pressure.  Pain is worse to get a deep breath. If yawn is hard. Cough is minimal. No wheeze  .  Albuterol helped to some degree.  No other inhalers used.   No smoking now.  Using the nicotine.   Cough is non productive.  See above for studies to date.   Past Medical History  Diagnosis Date  . Collagenous colitis   . WPW (Wolff-Parkinson-White syndrome)   . Kidney stone   . Addison disease   . Thyroid disease   . Clostridium difficile diarrhea   . Anxiety and depression   . Chronic diarrhea   .  Chronic back pain   . Drug-seeking behavior   . Irritable bowel syndrome (IBS)   . Anemia      Family History  Problem Relation Age of Onset  . Prostate cancer Father   . Coronary artery disease Maternal Grandfather      History   Social History  . Marital Status: Married    Spouse Name: N/A    Number of Children: 3  . Years of Education: N/A   Occupational History  . Inventory in a warehouse    Social History Main Topics  . Smoking status: Former Smoker --  1.0 packs/day for 30 years    Types: Cigarettes    Quit date: 09/03/2011  . Smokeless tobacco: Never Used  . Alcohol Use: No  . Drug Use: No  . Sexually Active: Yes    Birth Control/ Protection: Surgical   Other Topics Concern  . Not on file   Social History Narrative  . No narrative on file     Allergies  Allergen Reactions  . Ambien (Zolpidem Tartrate) Other (See Comments)    headache  . Nsaids Other (See Comments)    Bad for colitis  . Zofran Other (See Comments)    Headache      Outpatient Prescriptions Prior to Visit  Medication Sig Dispense Refill  . albuterol (PROVENTIL HFA;VENTOLIN HFA) 108 (90 BASE) MCG/ACT inhaler Inhale 2 puffs into the lungs every 6 (six) hours as needed for wheezing.  1 Inhaler  2  . clonazePAM (KLONOPIN) 1 MG tablet Take 1 mg by mouth 4 (four) times daily as needed. For anxiety      . HYDROcodone-acetaminophen (NORCO) 5-325 MG per tablet Take 1-2 tablets by mouth every 4 (four) hours as needed.  30 tablet  none  . hydrocortisone (CORTEF) 10 MG tablet Take 10 mg by mouth daily.      Marland Kitchen levothyroxine (SYNTHROID, LEVOTHROID) 50 MCG tablet Take 50 mcg by mouth daily.      . Multiple Vitamin (MULITIVITAMIN WITH MINERALS) TABS Take 1 tablet by mouth daily.        . nicotine (NICODERM CQ - DOSED IN MG/24 HOURS) 14 mg/24hr patch Place 1 patch onto the skin daily.  28 patch  0  . nortriptyline (PAMELOR) 25 MG capsule Take 25 mg by mouth at bedtime.      . pantoprazole (PROTONIX) 40 MG tablet Take 1 tablet (40 mg total) by mouth daily.  30 tablet  1  . promethazine (PHENERGAN) 25 MG tablet Take 25 mg by mouth as needed. For nausea          Review of Systems  Constitutional: Negative.  Negative for fever and unexpected weight change.  HENT: Negative.  Negative for ear pain, nosebleeds, congestion, sore throat, rhinorrhea, sneezing, trouble swallowing, dental problem, postnasal drip and sinus pressure.   Eyes: Negative.  Negative for redness and  itching.  Respiratory: Positive for cough, chest tightness and shortness of breath. Negative for wheezing.   Cardiovascular: Positive for chest pain. Negative for palpitations and leg swelling.  Gastrointestinal: Negative.  Negative for nausea and vomiting.  Genitourinary: Negative.  Negative for dysuria.  Musculoskeletal: Negative.  Negative for joint swelling.  Skin: Negative.  Negative for rash.  Neurological: Negative.  Negative for headaches.  Hematological: Negative.  Does not bruise/bleed easily.  Psychiatric/Behavioral: Negative for dysphoric mood. The patient is nervous/anxious.        Objective:   Physical Exam  Filed Vitals:   10/03/11 0935  BP:  102/76  Pulse: 105  Temp: 98.3 F (36.8 C)  TempSrc: Oral  Height: 5\' 4"  (1.626 m)  Weight: 134 lb 6.4 oz (60.963 kg)  SpO2: 98%    Gen: Pleasant, well-nourished, in no distress,  normal affect  ENT: No lesions,  mouth clear,  oropharynx clear, no postnasal drip  Neck: No JVD, no TMG, no carotid bruits  Lungs: No use of accessory muscles, no dullness to percussion, distant BS  Cardiovascular: RRR, heart sounds normal, no murmur or gallops, no peripheral edema  Abdomen: soft and NT, no HSM,  BS normal  Musculoskeletal: No deformities, no cyanosis or clubbing  Neuro: alert, non focal  Skin: Warm, no lesions or rashes  Dg Chest 2 View  10/03/2011  *RADIOLOGY REPORT*  Clinical Data: Cough.  Short of breath.  Chest pain.  Cigarette smoker.  CHEST - 2 VIEW  Comparison: 09/21/2011.  Findings: Right upper lobe pulmonary parenchymal distortion is present along the azygos fissure.  There is no appreciable change compared to prior examination.  There is no airspace disease.  No effusion.  No plain film evidence of adenopathy.  Cardiopericardial silhouette and mediastinal contours appear similar to prior.  IMPRESSION: No interval change or acute abnormality.  Stable medial right upper lobe density compared to prior CT and  radiograph.  No radiographic evidence of cavitation.  Original Report Authenticated By: Andreas Newport, M.D.           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 albuterol (PROVENTIL HFA;VENTOLIN HFA) 108 (90 BASE) MCG/ACT inhaler Inhale 2 puffs into the lungs every 6 (six) hours as needed for wheezing.  1 Inhaler  2  . clonazePAM (KLONOPIN) 1 MG tablet Take 1 mg by mouth 4 (four) times daily as needed. For anxiety      . HYDROcodone-acetaminophen (NORCO) 5-325 MG per tablet Take 1-2 tablets by mouth every 4 (four) hours as needed.  30 tablet  none  . hydrocortisone (CORTEF) 10 MG tablet Take 10 mg by mouth daily.      Marland Kitchen levothyroxine (SYNTHROID, LEVOTHROID) 50 MCG tablet Take 50 mcg by mouth daily.      . Multiple Vitamin (MULITIVITAMIN WITH MINERALS) TABS Take 1 tablet by mouth daily.        . nicotine (NICODERM CQ - DOSED IN MG/24 HOURS) 14 mg/24hr patch Place 1 patch onto the skin daily.  28 patch  0  . nortriptyline (PAMELOR) 25 MG capsule Take 25 mg by mouth at bedtime.      . pantoprazole (PROTONIX) 40 MG tablet Take 1 tablet (40 mg total) by mouth daily.  30 tablet  1  . promethazine (PHENERGAN) 25 MG tablet Take 25 mg by mouth as needed. For nausea      . budesonide-formoterol (SYMBICORT) 160-4.5 MCG/ACT inhaler Inhale 2 puffs  into the lungs 2 (two) times daily.  1 Inhaler  12  . DISCONTD: ketorolac (TORADOL) 60 MG/2ML SOLN injection Use as directed  for upset stomach      . DISCONTD: oxyCODONE-acetaminophen (PERCOCET) 5-325 MG per tablet Take 1 tablet by mouth 4 (four) times daily as needed.

## 2011-10-03 NOTE — Patient Instructions (Addendum)
Referral to allergy Dr Fannie Knee Use albuterol as needed Start Symbicort two puff twice daily Finish protonix and do not refill Use albuterol as needed Return 2 months

## 2011-10-03 NOTE — Assessment & Plan Note (Signed)
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

## 2011-10-10 ENCOUNTER — Encounter (HOSPITAL_BASED_OUTPATIENT_CLINIC_OR_DEPARTMENT_OTHER): Payer: Self-pay | Admitting: *Deleted

## 2011-10-10 ENCOUNTER — Emergency Department (HOSPITAL_BASED_OUTPATIENT_CLINIC_OR_DEPARTMENT_OTHER)
Admission: EM | Admit: 2011-10-10 | Discharge: 2011-10-10 | Disposition: A | Discharge status: Home / Self Care | Payer: 59 | Attending: Emergency Medicine | Admitting: Emergency Medicine

## 2011-10-10 DIAGNOSIS — F341 Dysthymic disorder: Secondary | ICD-10-CM | POA: Insufficient documentation

## 2011-10-10 DIAGNOSIS — I456 Pre-excitation syndrome: Secondary | ICD-10-CM | POA: Insufficient documentation

## 2011-10-10 DIAGNOSIS — G8929 Other chronic pain: Secondary | ICD-10-CM | POA: Insufficient documentation

## 2011-10-10 DIAGNOSIS — K589 Irritable bowel syndrome without diarrhea: Secondary | ICD-10-CM | POA: Insufficient documentation

## 2011-10-10 DIAGNOSIS — Z79899 Other long term (current) drug therapy: Secondary | ICD-10-CM | POA: Insufficient documentation

## 2011-10-10 DIAGNOSIS — E079 Disorder of thyroid, unspecified: Secondary | ICD-10-CM | POA: Insufficient documentation

## 2011-10-10 DIAGNOSIS — Z87891 Personal history of nicotine dependence: Secondary | ICD-10-CM | POA: Insufficient documentation

## 2011-10-10 DIAGNOSIS — R197 Diarrhea, unspecified: Secondary | ICD-10-CM | POA: Insufficient documentation

## 2011-10-10 DIAGNOSIS — R109 Unspecified abdominal pain: Secondary | ICD-10-CM | POA: Insufficient documentation

## 2011-10-10 DIAGNOSIS — R112 Nausea with vomiting, unspecified: Secondary | ICD-10-CM | POA: Insufficient documentation

## 2011-10-10 LAB — COMPREHENSIVE METABOLIC PANEL
ALT: 9 U/L (ref 0–35)
AST: 14 U/L (ref 0–37)
Alkaline Phosphatase: 81 U/L (ref 39–117)
CO2: 22 mEq/L (ref 19–32)
Calcium: 9 mg/dL (ref 8.4–10.5)
Chloride: 105 mEq/L (ref 96–112)
GFR calc Af Amer: >90 mL/min (ref >90)
GFR calc non Af Amer: >90 mL/min (ref >90)
Glucose, Bld: 88 mg/dL (ref 70–99)
Potassium: 3.1 mEq/L — ABNORMAL LOW (ref 3.5–5.1)
Sodium: 139 mEq/L (ref 135–145)

## 2011-10-10 LAB — CLOSTRIDIUM DIFFICILE BY PCR: Toxigenic C. Difficile by PCR: NEGATIVE

## 2011-10-10 LAB — CBC
Hemoglobin: 12.6 g/dL (ref 12.0–15.0)
RBC: 3.65 MIL/uL — ABNORMAL LOW (ref 3.87–5.11)

## 2011-10-10 MED ORDER — POTASSIUM CHLORIDE CRYS ER 20 MEQ PO TBCR
40.0000 meq | EXTENDED_RELEASE_TABLET | Freq: Once | ORAL | Status: AC
  Administered 2011-10-10: 40 meq via ORAL
  Filled 2011-10-10 (×2): qty 1

## 2011-10-10 MED ORDER — DICYCLOMINE HCL 10 MG/ML IM SOLN
20.0000 mg | Freq: Once | INTRAMUSCULAR | Status: AC
  Administered 2011-10-10: 20 mg via INTRAMUSCULAR
  Filled 2011-10-10: qty 2

## 2011-10-10 MED ORDER — SODIUM CHLORIDE 0.9 % IV BOLUS (SEPSIS)
1000.0000 mL | Freq: Once | INTRAVENOUS | Status: AC
  Administered 2011-10-10: 1000 mL via INTRAVENOUS

## 2011-10-10 MED ORDER — MORPHINE SULFATE 2 MG/ML IJ SOLN
2.0000 mg | Freq: Once | INTRAMUSCULAR | Status: AC
  Administered 2011-10-10: 2 mg via INTRAVENOUS
  Filled 2011-10-10: qty 1

## 2011-10-10 MED ORDER — PROMETHAZINE HCL 12.5 MG PO TABS: 12.5000 mg | ORAL_TABLET | Freq: Four times a day (QID) | ORAL | Status: AC | PRN

## 2011-10-10 MED ORDER — PROMETHAZINE HCL 25 MG/ML IJ SOLN
25.0000 mg | Freq: Once | INTRAMUSCULAR | Status: AC
  Administered 2011-10-10: 25 mg via INTRAVENOUS
  Filled 2011-10-10: qty 1

## 2011-10-10 NOTE — ED Notes (Signed)
Pt amb to room 11 with quick steady gait in nad. Pt reports "colitis" attack since Friday. Pt states she waited until today and called her pcp who told her to come here for eval.

## 2011-10-10 NOTE — ED Provider Notes (Signed)
History     CSN: [id number]  Arrival date & time 10/10/11  1048   First MD Initiated Contact with Patient 10/10/11 1148      Chief Complaint  Patient presents with  . Abdominal Pain    (Consider location/radiation/quality/duration/timing/severity/associated sxs/prior treatment) HPI Comments: Diarrhea, vomiting and cramping abdominal pain started Friday and has continued constantly. She reports having over 10 loose stools per day and several episodes of emesis per day. She is unable to keep down any solid food and has been drinking water and sprite. She has tried Lomotil without change in stool frequency. Pain rated at 8/10 currently, centered on RLQ radiating to the back. She has had her gall bladder removed. Nothing makes the pain better or worse except momentary relief after defecation. She is having some leg cramping, which she thinks is because her potassium is low. No dysuria or frequency.  She was recently diagnosed with a lung disease and started Symbicort inhaler.  Patient is a 46 y.o. female presenting with abdominal pain.  Abdominal Pain The primary symptoms of the illness include abdominal pain, shortness of breath, nausea, vomiting and diarrhea. The problem has not changed since onset. Additional symptoms associated with the illness include chills. Symptoms associated with the illness do not include constipation or frequency. Significant associated medical issues do not include inflammatory bowel disease.    Past Medical History  Diagnosis Date  . Collagenous colitis   . WPW (Wolff-Parkinson-White syndrome)   . Kidney stone   . Addison disease   . Thyroid disease   . Clostridium difficile diarrhea   . Anxiety and depression   . Chronic diarrhea   . Chronic back pain   . Drug-seeking behavior   . Irritable bowel syndrome (IBS)   . Anemia     Past Surgical History  Procedure Date  . Cholecystectomy   . Tubal ligation   . Lithotripsy   . Sphincterotomy   .  Cardiac electrophysiology mapping and ablation     for WPW  . Video bronchoscopy 08/31/2011    Procedure: VIDEO BRONCHOSCOPY WITH FLUORO;  Surgeon: Storm Frisk, MD;  Location: [facility];  Service: Cardiopulmonary;  Laterality: N/A;    Family History  Problem Relation Age of Onset  . Prostate cancer Father   . Coronary artery disease Maternal Grandfather     History  Substance Use Topics  . Smoking status: Former Smoker -- 1.0 packs/day for 30 years    Types: Cigarettes    Quit date: 09/03/2011  . Smokeless tobacco: Never Used  . Alcohol Use: No    OB History    Grav Para Term Preterm Abortions TAB SAB Ect Mult Living   3 3              Review of Systems  Constitutional: Positive for chills.  HENT: Negative for neck stiffness.   Eyes: Negative for visual disturbance.  Respiratory: Positive for shortness of breath. Negative for chest tightness.   Cardiovascular: Negative for leg swelling.  Gastrointestinal: Positive for nausea, vomiting, abdominal pain and diarrhea. Negative for constipation.  Genitourinary: Negative for frequency.  Neurological: Positive for light-headedness. Negative for headaches.  All other systems reviewed and are negative.    Allergies  Ambien; Nsaids; and Zofran  Home Medications   Current Outpatient Rx  Name Route Sig Dispense Refill  . ALBUTEROL SULFATE HFA 108 (90 BASE) MCG/ACT IN AERS Inhalation Inhale 2 puffs into the lungs every 6 (six) hours as needed for wheezing. 1 Inhaler  2  . BUDESONIDE-FORMOTEROL FUMARATE 160-4.5 MCG/ACT IN AERO Inhalation Inhale 2 puffs into the lungs 2 (two) times daily. 1 Inhaler 12  . CLONAZEPAM 1 MG PO TABS Oral Take 1 mg by mouth 4 (four) times daily as needed. For anxiety    . HYDROCORTISONE 10 MG PO TABS Oral Take 10 mg by mouth daily.    Marland Kitchen LEVOTHYROXINE SODIUM 50 MCG PO TABS Oral Take 50 mcg by mouth daily.    . ADULT MULTIVITAMIN W/MINERALS CH Oral Take 1 tablet by mouth daily.      Marland Kitchen NICOTINE 14  MG/24HR TD PT24 Transdermal Place 1 patch onto the skin daily. 28 patch 0  . NORTRIPTYLINE HCL 25 MG PO CAPS Oral Take 25 mg by mouth at bedtime.    Marland Kitchen PANTOPRAZOLE SODIUM 40 MG PO TBEC Oral Take 1 tablet (40 mg total) by mouth daily. 30 tablet 1  . PROMETHAZINE HCL 25 MG PO TABS Oral Take 25 mg by mouth as needed. For nausea      BP 125/84  Pulse 96  Temp 98.4 F (36.9 C) (Oral)  Resp 18  SpO2 100%  LMP 09/12/2011  Physical Exam  Constitutional: She appears well-developed.  Eyes: Pupils are equal, round, and reactive to light.  Neck: Neck supple.  Cardiovascular: Normal rate and normal heart sounds.   No murmur heard. Abdominal: She exhibits no distension. There is generalized tenderness. There is no rigidity, no rebound and no CVA tenderness.       Lap cholecystectomy scars noted Generalized tenderness, worse in RLG   Lymphadenopathy:    She has no cervical adenopathy.  Skin: Skin is warm and dry.    ED Course  Procedures (including critical care time)  Labs Reviewed  CBC - Abnormal; Notable for the following:    WBC 11.0 (*)     RBC 3.65 (*)     MCV 100.8 (*)     MCH 34.5 (*)     All other components within normal limits  COMPREHENSIVE METABOLIC PANEL - Abnormal; Notable for the following:    Potassium 3.1 (*)     All other components within normal limits  CLOSTRIDIUM DIFFICILE BY PCR   No results found.   No diagnosis found.    MDM   45 yo F with a history of recurrent abdominal pain and chronic diarrhea. Presents with 3 days of diarrhea, vomiting, and abdominal pain. Afebrile, vitals normal, no blood in stool, exam not concerning for acute abdomen. Doubt UTI given absence of dysuria, frequency or fever. Will refrain from using narcotics for now until repeat exam. Will give Phenergan for nausea and Bentyl as well as 1L NS bolus.   CMP to check for electrolyte abnormalities. WBC 11, Hgb normal. Doubt Cdiff but will send off for analysis if pt can provide a  sample.  Potassium low at 3.1, will replete with oral tablets.  Vital signs stable. Patient's symptoms improving. Tolerating liquids in ED. Stable for discharge with PCP follow up.        Larey Seat, MD 10/10/11 1524  Larey Seat, MD 10/10/11 1530

## 2011-10-12 ENCOUNTER — Encounter (HOSPITAL_COMMUNITY): Payer: Self-pay | Admitting: Psychiatry

## 2011-10-12 ENCOUNTER — Ambulatory Visit (INDEPENDENT_AMBULATORY_CARE_PROVIDER_SITE_OTHER): Payer: 59 | Admitting: Psychiatry

## 2011-10-12 VITALS — BP 122/94 | HR 109 | Ht 64.0 in | Wt 134.0 lb

## 2011-10-12 DIAGNOSIS — F410 Panic disorder [episodic paroxysmal anxiety] without agoraphobia: Secondary | ICD-10-CM

## 2011-10-12 DIAGNOSIS — F411 Generalized anxiety disorder: Secondary | ICD-10-CM

## 2011-10-12 MED ORDER — CLONAZEPAM 1 MG PO TABS: 1.0000 mg | ORAL_TABLET | Freq: Two times a day (BID) | ORAL | Status: DC | PRN

## 2011-10-12 MED ORDER — RAMELTEON 8 MG PO TABS: ORAL_TABLET | ORAL | Status: DC

## 2011-10-12 MED ORDER — SERTRALINE HCL 50 MG PO TABS: 50.0000 mg | ORAL_TABLET | Freq: Every day | ORAL | Status: DC

## 2011-10-12 MED ORDER — NORTRIPTYLINE HCL 10 MG PO CAPS: ORAL_CAPSULE | ORAL | Status: DC

## 2011-10-12 NOTE — ED Provider Notes (Signed)
I saw and evaluated the patient, reviewed the resident's note and I agree with the findings and plan.  Chronic abdominal pain/microscopic colitis. Seen freq for same. Feels typical. Mild diffuse ttp/no r/g. Will not CT again at this time. Well appearing, do not suspect relation to her hydrocortisone use. F/u Dr. Opal Sidles. No EMC precluding discharge at this time. Given Precautions for return. PMD f/u.   Forbes Cellar, MD 10/12/11 [phone]

## 2011-10-12 NOTE — Progress Notes (Signed)
Psychiatric Assessment Adult  Patient Identification:  Dawn Frost Date of Evaluation:  10/12/2011 Chief Complaint:  Chief Complaint  Patient presents with  . Anxiety  . Panic Attack   History of Chief Complaint:   HPI Comments: Dawn Frost  is a 46  y/o female with a past psychiatric history significant for Anxiety Disorder, NOS and Panic Disorder. The patient is referred for psychiatric services for psychiatric evaluation and medication management.   The patient reports that her main stressors are: "my health"-she recently had pneumonia and she has had colitis for the about 5 years; "my kids"-her oldest son came back a year ago from [place]- and has some issues with posttraumatic stress disorder, and her daughter is going to college-and she is having difficulty separating from her.  In the area of affective symptoms, patient appears mildly depressed and anxious. Patient denies current suicidal ideation, intent, or plan. Patient denies current homicidal ideation, intent, or plan. Patient denies auditory hallucinations. Patient denies visual hallucinations. Patient denies symptoms of paranoia. Patient states sleep is poor, with approximately 2 hours of sleep per night for the past 2 days despite taking clonazepam and nortriptyline. Appetite is poor. Energy level is low for the past 8 months. Patient denies symptoms of anhedonia, but reports that due to lack of energy she is unable to do things she enjoyed.  Patient denies hopelessness or guilt, but reports helplessness for the past 8 months regarding her irritable bowel.  Denies any recent episodes consistent with mania, particularly decreased need for sleep with increased energy, grandiosity, impulsivity, hyperverbal and pressured speech, or increased productivity. Denies any recent symptoms consistent with psychosis, particularly auditory or visual hallucinations, thought broadcasting/insertion/withdrawal, or ideas of reference.  She excessive  worry to the point of physical symptoms as well as any panic attacks (she reports that he heart will start to pound, she will have shortness of breath, and her whole body start to shake.  She states that her symptoms started about a year (she states she had panic attacks previously 15 years ago, for which she started Zoloft and she didn't have any panic attacks for a year.) She report that she would have panic attacks and stopped having panic attacks) but she had anxiety.  Denies any history of trauma or symptoms consistent with PTSD such as flashbacks, nightmares, hypervigilance, feelings of numbness or inability to connect with others. The patient reports her symptoms have been worsening over the past 8 months.   Review of Systems  Constitutional: Positive for activity change and fatigue. Negative for fever, chills, diaphoresis, appetite change and unexpected weight change.  Respiratory: Negative.   Cardiovascular: Negative.   Gastrointestinal: Positive for abdominal pain (chronic) and diarrhea (chronic). Negative for nausea, vomiting, constipation, blood in stool, abdominal distention, anal bleeding and rectal pain.  Hematological: Negative.   All other systems reviewed and are negative.   Filed Vitals:   10/12/11 0912  BP: 122/94  Pulse: 109  Height: 5\' 4"  (1.626 m)  Weight: 134 lb (60.782 kg)   Physical Exam  Vitals reviewed. Constitutional: She appears well-developed and well-nourished. She appears distressed.  Skin: She is not diaphoretic.    Traumatic Brain Injury: No   Past Psychiatric History:  Diagnosis: Anxiety Disorder, Panic Disorder  Hospitalizations: Patient denies.  Outpatient Care: Patient received outpatient care through [facility].  Substance Abuse Care: Patient denies  Self-Mutilation:Patient denies  Suicidal Attempts:Patient denies  Violent Behaviors: Patient denies   PCP. [facility].  Past Medical History:  Past  Medical History  Diagnosis Date  . Collagenous colitis   . WPW (Wolff-Parkinson-White syndrome)   . Kidney stone   . Addison disease   . Thyroid disease   . Clostridium difficile diarrhea   . Anxiety and depression   . Chronic diarrhea   . Chronic back pain   . Drug-seeking behavior   . Irritable bowel syndrome (IBS)   . Anemia    History of Loss of Consciousness:  No Seizure History:  Yes Cardiac History:  Yes  Allergies:   Allergies  Allergen Reactions  . Ambien (Zolpidem Tartrate) Other (See Comments)    headache  . Nsaids Other (See Comments)    Bad for colitis  . Zofran Other (See Comments)    Headache    Current Medications:  Current Outpatient Prescriptions  Medication Sig Dispense Refill  . albuterol (PROVENTIL HFA;VENTOLIN HFA) 108 (90 BASE) MCG/ACT inhaler Inhale 2 puffs into the lungs every 6 (six) hours as needed for wheezing.  1 Inhaler  2  . budesonide-formoterol (SYMBICORT) 160-4.5 MCG/ACT inhaler Inhale 2 puffs into the lungs 2 (two) times daily.  1 Inhaler  12  . clonazePAM (KLONOPIN) 1 MG tablet Take 1 mg by mouth 4 (four) times daily as needed. For anxiety      . hydrocortisone (CORTEF) 10 MG tablet Take 10 mg by mouth daily.      Marland Kitchen levothyroxine (SYNTHROID, LEVOTHROID) 50 MCG tablet Take 50 mcg by mouth daily.      . Multiple Vitamin (MULITIVITAMIN WITH MINERALS) TABS Take 1 tablet by mouth daily.        . nicotine (NICODERM CQ - DOSED IN MG/24 HOURS) 14 mg/24hr patch Place 1 patch onto the skin daily.  28 patch  0  . nortriptyline (PAMELOR) 25 MG capsule Take 25 mg by mouth at bedtime.      . pantoprazole (PROTONIX) 40 MG tablet Take 1 tablet (40 mg total) by mouth daily.  30 tablet  1  . promethazine (PHENERGAN) 12.5 MG tablet Take 1 tablet (12.5 mg total) by mouth every 6 (six) hours as needed for nausea.  20 tablet  0  . promethazine (PHENERGAN) 25 MG tablet Take 25 mg by mouth as needed. For nausea        Previous Psychotropic  Medications:  Medication  Zoloft-  Clonazepam-  Celexa-made her dizzy   Substance Abuse History: History  Substance Use Topics  . Smoking status: Current Everyday Smoker -- 0.1 packs/day for 30 years    Types: Cigarettes    Last Attempt to Quit: 09/03/2011  . Smokeless tobacco: Never Used  . Alcohol Use: No  Patient denies illicit substance use.  Blackouts:  No DT's:  No Withdrawal Symptoms:  No None  Social History: Current Place of Residence: [place], [place] Place of Birth: [place], [place] Family Members: Husband, daughter, and youngest son. 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 Legal History: Patient denies. Hobbies/Interests: Horse-back riding. Builds jumps for her riding ring.  Family History:   Family History  Problem Relation Age of Onset  . Prostate cancer Father   . Coronary artery disease Maternal Grandfather     Mental Status Examination/Evaluation: Objective:  Appearance: Casual and Disheveled  Eye Contact::  Fair  Speech:  Clear and Coherent and Normal Rate  Volume:  Normal  Mood:  "really tired"  Affect:  Appropriate, Congruent and Full Range  Thought Process:  Coherent, Linear and Logical  Orientation:  Full  Thought Content:  WDL  Suicidal Thoughts:  No  Homicidal Thoughts:  No  Judgement:  Fair  Insight:  Fair  Psychomotor Activity:  Normal  Akathisia:  No  Handed:  Right  Memory: 3/3 Immediate; 2/3 recent.  Concentration: fair  AIMS (if indicated):  None  Assets:  Communication Skills Desire for Improvement Financial Resources/Insurance Housing Intimacy Leisure Time    Laboratory/X-Ray  Psychological Evaluation(s)   None  None   Assessment:   AXIS I Anxiety Disorder NOS and Panic Disorder  AXIS II Cluster C Traits  AXIS III Past Medical History  Diagnosis Date  . Collagenous colitis   . WPW (Wolff-Parkinson-White syndrome)   . Kidney stone   . Addison disease   . Thyroid disease   . Clostridium difficile diarrhea   . Anxiety and depression   . Chronic diarrhea   . Chronic back pain   . Drug-seeking behavior   . Irritable bowel syndrome (IBS)   . Anemia      AXIS IV other psychosocial or environmental problems  AXIS V 51-60 moderate symptoms   Treatment Plan/Recommendations:  1. Affirm with the patient that the medications are taken as ordered. Patient expressed understanding of how their medications were to be used.  2. Continue the following psychiatric medications as written prior to this appointment with the following changes:  a) Clonazepam 1 mg-Take 1 tablet (1 mg total) by mouth 2 (two) times daily as needed for anxiety. Will not use on a long term basis. b) Will taper Nortriptyline 10 mg-Take one capsule for 7 days, then take every other day then stop, given history of WPW (Wolff-Parkinson-White syndrome). c) Sertraline 50 mg-Take one-half tablet (25 mg) for 7 days, then 50 mg for 7 days, then one and a half tablets (75 mg) for 7 days, then 2 tablets (100 mg) daily. d) Will initiate a trial of Rozerem 8 mg-one to 2 tablets daily for sleep. Would not use any other prescription medication given her current medications. 3. Therapy: brief supportive therapy provided. Discussed psychosocial stressors. 4. Risks and benefits, side effects and alternatives discussed with patient, he/she was given an opportunity to  ask questions about her medication, illness, and treatment. All current psychiatric  medications have been reviewed and discussed with the patient and adjusted as clinically appropriate. The patient has been provided an accurate and updated list of the  medications being now prescribed.  5. Patient told to call clinic if any problems occur. Patient advised to go to ER  if she should develop SI/HI, side effects, or if symptoms worsen.  6. Will order urine drug screen. 7. The patient was encouraged to keep all PCP and specialty clinic appointments.  8. Patient was instructed to return to clinic in 1 month.  9. The patient was advised to call and cancel their mental health appointment within 24 hours of the appointment, if they are unable to keep the appointment.  10. The patient expressed understanding of the plan and agrees with the above.   Jacqulyn Cane, MD 7/10/20139:07 AM

## 2011-10-13 LAB — DRUGS OF ABUSE SCREEN W/O ALC, ROUTINE URINE
Amphetamine Screen, Ur: NEGATIVE
Barbiturate Quant, Ur: NEGATIVE
Cocaine Metabolites: NEGATIVE
Creatinine,U: 147.2 mg/dL
Methadone: NEGATIVE
Opiate Screen, Urine: NEGATIVE

## 2011-10-14 LAB — AFB CULTURE WITH SMEAR (NOT AT ARMC): Acid Fast Smear: NONE SEEN

## 2011-10-20 ENCOUNTER — Emergency Department (HOSPITAL_COMMUNITY)
Admission: EM | Admit: 2011-10-20 | Discharge: 2011-10-20 | Disposition: A | Discharge status: Home / Self Care | Payer: 59 | Attending: Emergency Medicine | Admitting: Emergency Medicine

## 2011-10-20 ENCOUNTER — Encounter (HOSPITAL_COMMUNITY): Payer: Self-pay | Admitting: Emergency Medicine

## 2011-10-20 DIAGNOSIS — R197 Diarrhea, unspecified: Secondary | ICD-10-CM | POA: Insufficient documentation

## 2011-10-20 DIAGNOSIS — I456 Pre-excitation syndrome: Secondary | ICD-10-CM | POA: Insufficient documentation

## 2011-10-20 DIAGNOSIS — R109 Unspecified abdominal pain: Secondary | ICD-10-CM

## 2011-10-20 DIAGNOSIS — F17200 Nicotine dependence, unspecified, uncomplicated: Secondary | ICD-10-CM | POA: Insufficient documentation

## 2011-10-20 DIAGNOSIS — G8929 Other chronic pain: Secondary | ICD-10-CM | POA: Insufficient documentation

## 2011-10-20 DIAGNOSIS — D649 Anemia, unspecified: Secondary | ICD-10-CM | POA: Insufficient documentation

## 2011-10-20 DIAGNOSIS — E079 Disorder of thyroid, unspecified: Secondary | ICD-10-CM | POA: Insufficient documentation

## 2011-10-20 DIAGNOSIS — R112 Nausea with vomiting, unspecified: Secondary | ICD-10-CM

## 2011-10-20 LAB — POCT PREGNANCY, URINE: Preg Test, Ur: NEGATIVE

## 2011-10-20 LAB — CBC WITH DIFFERENTIAL/PLATELET
Basophils Absolute: 0.1 10*3/uL (ref 0.0–0.1)
Basophils Relative: 1 % (ref 0–1)
Eosinophils Relative: 6 % — ABNORMAL HIGH (ref 0–5)
HCT: 41.7 % (ref 36.0–46.0)
MCHC: 33.1 g/dL (ref 30.0–36.0)
MCV: 103.2 fL — ABNORMAL HIGH (ref 78.0–100.0)
Monocytes Absolute: 0.6 10*3/uL (ref 0.1–1.0)
Platelets: 325 10*3/uL (ref 150–400)
RDW: 14.5 % (ref 11.5–15.5)

## 2011-10-20 LAB — COMPREHENSIVE METABOLIC PANEL
AST: 17 U/L (ref 0–37)
Albumin: 3.9 g/dL (ref 3.5–5.2)
Calcium: 9.4 mg/dL (ref 8.4–10.5)
Creatinine, Ser: 0.8 mg/dL (ref 0.50–1.10)
Sodium: 136 mEq/L (ref 135–145)
Total Protein: 7.3 g/dL (ref 6.0–8.3)

## 2011-10-20 LAB — URINE MICROSCOPIC-ADD ON

## 2011-10-20 LAB — URINALYSIS, ROUTINE W REFLEX MICROSCOPIC
Glucose, UA: NEGATIVE mg/dL
Specific Gravity, Urine: 1.029 (ref 1.005–1.030)
Urobilinogen, UA: 0.2 mg/dL (ref 0.0–1.0)
pH: 6 (ref 5.0–8.0)

## 2011-10-20 MED ORDER — PROMETHAZINE HCL 25 MG/ML IJ SOLN
12.5000 mg | Freq: Once | INTRAMUSCULAR | Status: AC
  Administered 2011-10-20: 12.5 mg via INTRAVENOUS
  Filled 2011-10-20: qty 1

## 2011-10-20 MED ORDER — HYDROMORPHONE HCL PF 1 MG/ML IJ SOLN
1.0000 mg | Freq: Once | INTRAMUSCULAR | Status: AC
  Administered 2011-10-20: 1 mg via INTRAVENOUS
  Filled 2011-10-20: qty 1

## 2011-10-20 MED ORDER — DICYCLOMINE HCL 10 MG PO CAPS
20.0000 mg | ORAL_CAPSULE | Freq: Once | ORAL | Status: AC
  Administered 2011-10-20: 20 mg via ORAL
  Filled 2011-10-20 (×2): qty 2

## 2011-10-20 MED ORDER — PROMETHAZINE HCL 25 MG PO TABS: 25.0000 mg | ORAL_TABLET | Freq: Four times a day (QID) | ORAL | Status: AC | PRN

## 2011-10-20 MED ORDER — DICYCLOMINE HCL 20 MG PO TABS: 20.0000 mg | ORAL_TABLET | Freq: Two times a day (BID) | ORAL | Status: DC

## 2011-10-20 MED ORDER — SODIUM CHLORIDE 0.9 % IV SOLN
1000.0000 mL | INTRAVENOUS | Status: DC
  Administered 2011-10-20: 1000 mL via INTRAVENOUS

## 2011-10-20 MED ORDER — OXYCODONE-ACETAMINOPHEN 5-325 MG PO TABS: 1.0000 | ORAL_TABLET | Freq: Four times a day (QID) | ORAL | Status: AC | PRN

## 2011-10-20 MED ORDER — CIPROFLOXACIN HCL 500 MG PO TABS: 500.0000 mg | ORAL_TABLET | Freq: Two times a day (BID) | ORAL | Status: AC

## 2011-10-20 MED ORDER — METRONIDAZOLE 500 MG PO TABS: 500.0000 mg | ORAL_TABLET | Freq: Two times a day (BID) | ORAL | Status: AC

## 2011-10-20 NOTE — ED Provider Notes (Signed)
Medical screening examination/treatment/procedure(s) were performed by non-physician practitioner and as supervising physician I was immediately available for consultation/collaboration.   Lyanne Co, MD 10/20/11 [phone]

## 2011-10-20 NOTE — ED Notes (Signed)
Pt presenting to ed with c/o abdominal pain with n/v/d and leg cramps. Pt states history of colitis and this feels like a flare up but worse than her normal episodes. Pt states onset x 2 days.

## 2011-10-20 NOTE — ED Provider Notes (Signed)
History     CSN: [id number]  Arrival date & time 10/20/11  0706   First MD Initiated Contact with Patient 10/20/11 0719      Chief Complaint  Patient presents with  . Abdominal Pain  . Nausea  . Diarrhea    (Consider location/radiation/quality/duration/timing/severity/associated sxs/prior treatment) HPI Comments: Patient with history of microscopic colitis, recent hospitalization for pneumonitis-presents with complaint of abdominal pain, nausea, vomiting, diarrhea that has been present for the past 2 days and gradually worsening. Patient states that the abdominal pain is cramping in nature and worse on the left side of her abdomen. Her abdominal pain is worse than usual and is a little unusual because the pain is usually on the right side. Patient has had 4-5 episodes of nonbilious, nonbloody diarrhea overnight. She's taken Tylenol and Phenergan without relief. Patient also has watery stools without blood. No history of recent antibiotics, no history of recent travel. Patient states that she has had C. difficile in the past however this is not the same as her previous symptoms. She denies fever, URI symptoms, chest pain, shortness of breath, vaginal bleeding, vaginal discharge, blood in her stool. Nothing makes symptoms better or worse. Onset was gradual. Course is gradually worsening.  Patient is a 46 y.o. female presenting with abdominal pain and diarrhea. The history is provided by the patient.  Abdominal Pain The primary symptoms of the illness include abdominal pain, nausea, vomiting and diarrhea. The primary symptoms of the illness do not include fever, shortness of breath, dysuria, vaginal discharge or vaginal bleeding. The current episode started 2 days ago. The problem has been gradually worsening.  Symptoms associated with the illness do not include hematuria, frequency or back pain. Significant associated medical issues do not include diverticulitis.  Diarrhea The primary symptoms  include abdominal pain, nausea, vomiting and diarrhea. Primary symptoms do not include fever, dysuria, myalgias or rash.  The illness does not include back pain. Associated medical issues do not include diverticulitis.    Past Medical History  Diagnosis Date  . Collagenous colitis   . WPW (Wolff-Parkinson-White syndrome)   . Kidney stone   . Addison disease   . Thyroid disease   . Clostridium difficile diarrhea   . Anxiety and depression   . Chronic diarrhea   . Chronic back pain   . Drug-seeking behavior   . Irritable bowel syndrome (IBS)   . Anemia     Past Surgical History  Procedure Date  . Cholecystectomy   . Tubal ligation   . Lithotripsy   . Sphincterotomy   . Cardiac electrophysiology mapping and ablation     for WPW  . Video bronchoscopy 08/31/2011    Procedure: VIDEO BRONCHOSCOPY WITH FLUORO;  Surgeon: Storm Frisk, MD;  Location: [facility];  Service: Cardiopulmonary;  Laterality: N/A;    Family History  Problem Relation Age of Onset  . Prostate cancer Father   . Coronary artery disease Maternal Grandfather     History  Substance Use Topics  . Smoking status: Current Everyday Smoker -- 0.1 packs/day for 30 years    Types: Cigarettes    Last Attempt to Quit: 09/03/2011  . Smokeless tobacco: Never Used  . Alcohol Use: No    OB History    Grav Para Term Preterm Abortions TAB SAB Ect Mult Living   3 3              Review of Systems  Constitutional: Negative for fever.  HENT: Negative for sore throat  and rhinorrhea.   Eyes: Negative for redness.  Respiratory: Negative for cough and shortness of breath.   Cardiovascular: Negative for chest pain.  Gastrointestinal: Positive for nausea, vomiting, abdominal pain and diarrhea. Negative for blood in stool.  Genitourinary: Negative for dysuria, frequency, hematuria, vaginal bleeding and vaginal discharge.  Musculoskeletal: Negative for myalgias and back pain.  Skin: Negative for rash.  Neurological:  Negative for headaches.    Allergies  Ambien; Nsaids; and Zofran  Home Medications   Current Outpatient Rx  Name Route Sig Dispense Refill  . ALBUTEROL SULFATE HFA 108 (90 BASE) MCG/ACT IN AERS Inhalation Inhale 2 puffs into the lungs every 6 (six) hours as needed for wheezing. 1 Inhaler 2  . BUDESONIDE-FORMOTEROL FUMARATE 160-4.5 MCG/ACT IN AERO Inhalation Inhale 2 puffs into the lungs 2 (two) times daily. 1 Inhaler 12  . CLONAZEPAM 1 MG PO TABS Oral Take 1 tablet (1 mg total) by mouth 2 (two) times daily as needed for anxiety. 60 tablet 0  . HYDROCORTISONE 10 MG PO TABS Oral Take 10 mg by mouth daily.    Marland Kitchen LEVOTHYROXINE SODIUM 50 MCG PO TABS Oral Take 50 mcg by mouth daily.    . ADULT MULTIVITAMIN W/MINERALS CH Oral Take 1 tablet by mouth daily.      Marland Kitchen NICOTINE 14 MG/24HR TD PT24 Transdermal Place 1 patch onto the skin daily. 28 patch 0  . NORTRIPTYLINE HCL 10 MG PO CAPS  Take one capsule for 7 days, then take every other day then stop. 11 capsule 0  . PANTOPRAZOLE SODIUM 40 MG PO TBEC Oral Take 1 tablet (40 mg total) by mouth daily. 30 tablet 1  . POTASSIUM CHLORIDE CRYS ER 20 MEQ PO TBCR Oral Take 20 mEq by mouth daily.    . SERTRALINE HCL 50 MG PO TABS Oral Take 1 tablet (50 mg total) by mouth daily. Take one-half tablet (25 mg) for 7 days, then 50 mg for 7 days, then one and a half tablets (75 mg) for 7 days, then 2 tablets  (100 mg) daily. 70 tablet 1  . RAMELTEON 8 MG PO TABS  Take one to two tablets daily, as needed for sleep. 60 tablet 0    BP 120/80  Pulse 110  Temp 97.6 F (36.4 C)  Resp 20  Ht 5\' 4"  (1.626 m)  Wt 133 lb (60.328 kg)  BMI 22.83 kg/m2  SpO2 100%  LMP 10/11/2011  Physical Exam  Nursing note and vitals reviewed. Constitutional: She appears well-developed and well-nourished. She appears distressed.  HENT:  Head: Normocephalic and atraumatic.  Eyes: Conjunctivae are normal. Right eye exhibits no discharge. Left eye exhibits no discharge.  Neck: Normal  range of motion. Neck supple.  Cardiovascular: Normal rate, regular rhythm and normal heart sounds.   Pulmonary/Chest: Effort normal and breath sounds normal.  Abdominal: Soft. There is tenderness (mild to moderate) in the right lower quadrant, periumbilical area, suprapubic area and left lower quadrant. There is no rigidity, no rebound, no guarding, no tenderness at McBurney's point and negative Murphy's sign.  Neurological: She is alert.  Skin: Skin is warm and dry.  Psychiatric: She has a normal mood and affect.    ED Course  Procedures (including critical care time)  Labs Reviewed  CBC WITH DIFFERENTIAL - Abnormal; Notable for the following:    WBC 10.8 (*)     MCV 103.2 (*)     MCH 34.2 (*)     Eosinophils Relative 6 (*)  All other components within normal limits  COMPREHENSIVE METABOLIC PANEL - Abnormal; Notable for the following:    Alkaline Phosphatase 120 (*)     GFR calc non Af Amer 87 (*)     All other components within normal limits  URINALYSIS, ROUTINE W REFLEX MICROSCOPIC - Abnormal; Notable for the following:    APPearance CLOUDY (*)     Hgb urine dipstick SMALL (*)     All other components within normal limits  URINE MICROSCOPIC-ADD ON - Abnormal; Notable for the following:    Squamous Epithelial / LPF MANY (*)     Bacteria, UA MANY (*)     All other components within normal limits  POCT PREGNANCY, URINE  SPECIMEN HOLD   No results found.   1. Abdominal pain   2. Nausea vomiting and diarrhea     7:48 AM Patient seen and examined. Previous records reviewed. Work-up initiated. Medications ordered.   Vital signs reviewed and are as follows: Filed Vitals:   10/20/11 0710  BP: 120/80  Pulse: 110  Temp: 97.6 F (36.4 C)  Resp: 20   9:06 AM Re-exam: patient feeling better. Abd soft, mild tenderness in LLQ only. She does not want additional pain medicines and we will wait and see how she does. Labs reassuring.    Patient discussed with Dr. Patria Mane.    Patient notes improvement in her pain. She had one additional episode of diarrhea in emergency department. Patient states that she is ready for discharge and feels comfortable treating her symptoms at home. She has been tolerating fluids in the room without vomiting. On reexam, patient's abdomen is soft. She has mild diffuse tenderness. Patient appears well.  Patient prescribed Cipro and Flagyl. She is instructed to take these in 48 hours if she is not having improvement. She's encouraged to follow up with her GI doctor as soon as possible for further evaluation.  The patient was urged to return to the Emergency Department immediately with worsening of current symptoms, worsening abdominal pain, persistent vomiting, blood noted in stools, fever, or any other concerns. The patient verbalized understanding.   Patient counseled on use of narcotic pain medications. Counseled not to combine these medications with others containing tylenol. Urged not to drink alcohol, drive, or perform any other activities that requires focus while taking these medications. The patient verbalizes understanding and agrees with the plan.  BP 111/75  Pulse 82  Temp 98.3 F (36.8 C) (Oral)  Resp 18  Ht 5\' 4"  (1.626 m)  Wt 133 lb (60.328 kg)  BMI 22.83 kg/m2  SpO2 100%  LMP 10/11/2011   MDM  Patient with history of microscopic colitis. She had having her typical abdominal pain with nausea/vomiting/diarrhea. Suspect this is her etiology. No risk factors for C. difficile and patient states this seems different than previous C. difficile infections. Diverticulitis considered however patient does not have a fever, her stool is nonbloody, and she does not have any history of similar. CT not ordered given the suspicion, multiple previous CAT scans performed in the past. Antibiotics given for patient to begin in 2 days if symptoms are not improved. Patient appears well and nontoxic at time of discharge. Appropriate return  instructions given.  No suspicion for appendicitis, cholecystitis.  Pulmonary symptoms from previous hospitalization resolved per patient and no further eval done at this visit.        Renne Crigler, [place] 10/20/11 1313

## 2011-10-20 NOTE — ED Notes (Signed)
Pt given Sprite for oral trial per PA, will monitor.

## 2011-10-21 ENCOUNTER — Telehealth (HOSPITAL_COMMUNITY): Payer: Self-pay

## 2011-10-21 NOTE — Telephone Encounter (Signed)
Called patient. Recommended over the counter Melatonin 3-6 mg at bedtime for insomnia, given that patient is already taking clonazepam, oxycodone, and nortriptyline.

## 2011-10-24 ENCOUNTER — Ambulatory Visit: Payer: 59 | Admitting: Critical Care Medicine

## 2011-10-27 ENCOUNTER — Encounter (HOSPITAL_COMMUNITY): Payer: Self-pay | Admitting: Psychiatry

## 2011-11-10 ENCOUNTER — Emergency Department (HOSPITAL_BASED_OUTPATIENT_CLINIC_OR_DEPARTMENT_OTHER)
Admission: EM | Admit: 2011-11-10 | Discharge: 2011-11-10 | Disposition: A | Discharge status: Home / Self Care | Payer: 59 | Attending: Emergency Medicine | Admitting: Emergency Medicine

## 2011-11-10 ENCOUNTER — Ambulatory Visit (HOSPITAL_COMMUNITY): Payer: Self-pay | Admitting: Psychiatry

## 2011-11-10 ENCOUNTER — Encounter (HOSPITAL_BASED_OUTPATIENT_CLINIC_OR_DEPARTMENT_OTHER): Payer: Self-pay | Admitting: *Deleted

## 2011-11-10 DIAGNOSIS — F3289 Other specified depressive episodes: Secondary | ICD-10-CM | POA: Insufficient documentation

## 2011-11-10 DIAGNOSIS — F17200 Nicotine dependence, unspecified, uncomplicated: Secondary | ICD-10-CM | POA: Insufficient documentation

## 2011-11-10 DIAGNOSIS — E2749 Other adrenocortical insufficiency: Secondary | ICD-10-CM | POA: Insufficient documentation

## 2011-11-10 DIAGNOSIS — D649 Anemia, unspecified: Secondary | ICD-10-CM | POA: Insufficient documentation

## 2011-11-10 DIAGNOSIS — Z886 Allergy status to analgesic agent status: Secondary | ICD-10-CM | POA: Insufficient documentation

## 2011-11-10 DIAGNOSIS — M549 Dorsalgia, unspecified: Secondary | ICD-10-CM | POA: Insufficient documentation

## 2011-11-10 DIAGNOSIS — K5289 Other specified noninfective gastroenteritis and colitis: Secondary | ICD-10-CM | POA: Insufficient documentation

## 2011-11-10 DIAGNOSIS — Z79899 Other long term (current) drug therapy: Secondary | ICD-10-CM | POA: Insufficient documentation

## 2011-11-10 DIAGNOSIS — I456 Pre-excitation syndrome: Secondary | ICD-10-CM | POA: Insufficient documentation

## 2011-11-10 DIAGNOSIS — R1110 Vomiting, unspecified: Secondary | ICD-10-CM

## 2011-11-10 DIAGNOSIS — F329 Major depressive disorder, single episode, unspecified: Secondary | ICD-10-CM | POA: Insufficient documentation

## 2011-11-10 DIAGNOSIS — Z888 Allergy status to other drugs, medicaments and biological substances status: Secondary | ICD-10-CM | POA: Insufficient documentation

## 2011-11-10 DIAGNOSIS — F411 Generalized anxiety disorder: Secondary | ICD-10-CM | POA: Insufficient documentation

## 2011-11-10 DIAGNOSIS — K529 Noninfective gastroenteritis and colitis, unspecified: Secondary | ICD-10-CM

## 2011-11-10 DIAGNOSIS — Z87442 Personal history of urinary calculi: Secondary | ICD-10-CM | POA: Insufficient documentation

## 2011-11-10 DIAGNOSIS — Z86718 Personal history of other venous thrombosis and embolism: Secondary | ICD-10-CM | POA: Insufficient documentation

## 2011-11-10 LAB — CBC WITH DIFFERENTIAL/PLATELET
Basophils Relative: 0 % (ref 0–1)
Eosinophils Absolute: 0.3 10*3/uL (ref 0.0–0.7)
Hemoglobin: 13.1 g/dL (ref 12.0–15.0)
MCH: 34.7 pg — ABNORMAL HIGH (ref 26.0–34.0)
MCHC: 34.2 g/dL (ref 30.0–36.0)
Monocytes Relative: 7 % (ref 3–12)
Neutrophils Relative %: 70 % (ref 43–77)
RDW: 13.7 % (ref 11.5–15.5)

## 2011-11-10 LAB — COMPREHENSIVE METABOLIC PANEL
Albumin: 3.9 g/dL (ref 3.5–5.2)
BUN: 11 mg/dL (ref 6–23)
Creatinine, Ser: 0.7 mg/dL (ref 0.50–1.10)
Potassium: 3.3 mEq/L — ABNORMAL LOW (ref 3.5–5.1)
Total Protein: 7 g/dL (ref 6.0–8.3)

## 2011-11-10 MED ORDER — DICYCLOMINE HCL 10 MG PO CAPS
20.0000 mg | ORAL_CAPSULE | Freq: Once | ORAL | Status: AC
  Administered 2011-11-10: 20 mg via ORAL
  Filled 2011-11-10: qty 2

## 2011-11-10 MED ORDER — POTASSIUM CHLORIDE CRYS ER 20 MEQ PO TBCR
40.0000 meq | EXTENDED_RELEASE_TABLET | Freq: Once | ORAL | Status: AC
  Administered 2011-11-10: 40 meq via ORAL
  Filled 2011-11-10: qty 2

## 2011-11-10 MED ORDER — DICYCLOMINE HCL 10 MG PO CAPS: 10.0000 mg | ORAL_CAPSULE | Freq: Once | ORAL | Status: DC

## 2011-11-10 MED ORDER — PROMETHAZINE HCL 25 MG/ML IJ SOLN
25.0000 mg | Freq: Once | INTRAMUSCULAR | Status: AC
  Administered 2011-11-10: 25 mg via INTRAVENOUS
  Filled 2011-11-10: qty 1

## 2011-11-10 MED ORDER — POTASSIUM CHLORIDE CRYS ER 20 MEQ PO TBCR
EXTENDED_RELEASE_TABLET | ORAL | Status: AC
  Filled 2011-11-10: qty 1

## 2011-11-10 MED ORDER — DIPHENOXYLATE-ATROPINE 2.5-0.025 MG PO TABS
2.0000 | ORAL_TABLET | Freq: Once | ORAL | Status: AC
  Administered 2011-11-10: 2 via ORAL
  Filled 2011-11-10: qty 1
  Filled 2011-11-10: qty 2

## 2011-11-10 MED ORDER — SODIUM CHLORIDE 0.9 % IV BOLUS (SEPSIS)
1000.0000 mL | Freq: Once | INTRAVENOUS | Status: AC
  Administered 2011-11-10: 1000 mL via INTRAVENOUS

## 2011-11-10 MED ORDER — CLONAZEPAM 1 MG PO TABS: 1.0000 mg | ORAL_TABLET | Freq: Two times a day (BID) | ORAL | Status: DC | PRN

## 2011-11-10 MED ORDER — HYDROMORPHONE HCL PF 1 MG/ML IJ SOLN
1.0000 mg | Freq: Once | INTRAMUSCULAR | Status: AC
  Administered 2011-11-10: 1 mg via INTRAVENOUS
  Filled 2011-11-10: qty 1

## 2011-11-10 NOTE — ED Provider Notes (Addendum)
History     CSN: [id number]  Arrival date & time 11/10/11  4098   First MD Initiated Contact with Patient 11/10/11 [phone]      Chief Complaint  Patient presents with  . Abdominal Pain  . Emesis  . Diarrhea    (Consider location/radiation/quality/duration/timing/severity/associated sxs/prior treatment) Patient is a 46 y.o. female presenting with abdominal pain, vomiting, and diarrhea. The history is provided by the patient.  Abdominal Pain The primary symptoms of the illness include abdominal pain, nausea, vomiting and diarrhea. The primary symptoms of the illness do not include fever, dysuria, vaginal discharge or vaginal bleeding. Episode onset: 4 days ago. The onset of the illness was gradual. The problem has been gradually worsening.  The pain came on gradually. The abdominal pain has been unchanged since its onset. The abdominal pain is located in the RLQ. The abdominal pain does not radiate. The severity of the abdominal pain is 5/10. The abdominal pain is relieved by nothing. The abdominal pain is exacerbated by vomiting and eating.  The diarrhea began 3 to 5 days ago. The diarrhea is watery. The diarrhea occurs more than 10 times per day. Risk factors: Still taking her Cortef and anti-colitis medications.  Associated with: Worse with eating. Additional symptoms associated with the illness include chills and anorexia. Symptoms associated with the illness do not include constipation, urgency, frequency or back pain. Significant associated medical issues include inflammatory bowel disease. Associated medical issues comments: Microscopic colitis.  Emesis  Associated symptoms include abdominal pain, chills and diarrhea. Pertinent negatives include no fever.  Diarrhea The primary symptoms include abdominal pain, nausea, vomiting and diarrhea. Primary symptoms do not include fever or dysuria.  The illness is also significant for chills and anorexia. The illness does not include constipation  or back pain. Significant associated medical issues include inflammatory bowel disease. Associated medical issues comments: Microscopic colitis.    Past Medical History  Diagnosis Date  . Collagenous colitis   . WPW (Wolff-Parkinson-White syndrome)   . Kidney stone   . Addison disease   . Thyroid disease   . Clostridium difficile diarrhea   . Anxiety and depression   . Chronic diarrhea   . Chronic back pain   . Drug-seeking behavior   . Irritable bowel syndrome (IBS)   . Anemia     Past Surgical History  Procedure Date  . Cholecystectomy   . Tubal ligation   . Lithotripsy   . Sphincterotomy   . Cardiac electrophysiology mapping and ablation     for WPW  . Video bronchoscopy 08/31/2011    Procedure: VIDEO BRONCHOSCOPY WITH FLUORO;  Surgeon: Storm Frisk, MD;  Location: [facility];  Service: Cardiopulmonary;  Laterality: N/A;    Family History  Problem Relation Age of Onset  . Prostate cancer Father   . Coronary artery disease Maternal Grandfather     History  Substance Use Topics  . Smoking status: Current Everyday Smoker -- 0.1 packs/day for 30 years    Types: Cigarettes    Last Attempt to Quit: 09/03/2011  . Smokeless tobacco: Never Used  . Alcohol Use: No    OB History    Grav Para Term Preterm Abortions TAB SAB Ect Mult Living   3 3              Review of Systems  Constitutional: Positive for chills. Negative for fever.  Gastrointestinal: Positive for nausea, vomiting, abdominal pain, diarrhea and anorexia. Negative for constipation.  Genitourinary: Negative for dysuria, urgency, frequency,  vaginal bleeding and vaginal discharge.  Musculoskeletal: Negative for back pain.  All other systems reviewed and are negative.    Allergies  Ambien; Nsaids; and Zofran  Home Medications   Current Outpatient Rx  Name Route Sig Dispense Refill  . ALBUTEROL SULFATE HFA 108 (90 BASE) MCG/ACT IN AERS Inhalation Inhale 2 puffs into the lungs every 6 (six)  hours as needed for wheezing. 1 Inhaler 2  . BUDESONIDE-FORMOTEROL FUMARATE 160-4.5 MCG/ACT IN AERO Inhalation Inhale 2 puffs into the lungs 2 (two) times daily. 1 Inhaler 12  . CLONAZEPAM 1 MG PO TABS Oral Take 1 tablet (1 mg total) by mouth 2 (two) times daily as needed for anxiety. 60 tablet 0  . DICYCLOMINE HCL 20 MG PO TABS Oral Take 1 tablet (20 mg total) by mouth 2 (two) times daily. 20 tablet 0  . HYDROCORTISONE 10 MG PO TABS Oral Take 10 mg by mouth daily.    Marland Kitchen LEVOTHYROXINE SODIUM 50 MCG PO TABS Oral Take 50 mcg by mouth daily.    . ADULT MULTIVITAMIN W/MINERALS CH Oral Take 1 tablet by mouth daily.      Marland Kitchen NORTRIPTYLINE HCL 10 MG PO CAPS  Take one capsule for 7 days, then take every other day then stop. 11 capsule 0  . PANTOPRAZOLE SODIUM 40 MG PO TBEC Oral Take 1 tablet (40 mg total) by mouth daily. 30 tablet 1  . POTASSIUM CHLORIDE CRYS ER 20 MEQ PO TBCR Oral Take 20 mEq by mouth daily.    Marland Kitchen RAMELTEON 8 MG PO TABS  Take one to two tablets daily, as needed for sleep. 60 tablet 0  . SERTRALINE HCL 50 MG PO TABS Oral Take 1 tablet (50 mg total) by mouth daily. Take one-half tablet (25 mg) for 7 days, then 50 mg for 7 days, then one and a half tablets (75 mg) for 7 days, then 2 tablets  (100 mg) daily. 70 tablet 1    BP 129/89  Pulse 109  Temp 98 F (36.7 C) (Oral)  Resp 20  Ht 5\' 4"  (1.626 m)  Wt 133 lb (60.328 kg)  BMI 22.83 kg/m2  SpO2 100%  LMP 10/11/2011  Physical Exam  Nursing note and vitals reviewed. Constitutional: She is oriented to person, place, and time. She appears well-developed and well-nourished. No distress.  HENT:  Head: Normocephalic and atraumatic.  Mouth/Throat: Oropharynx is clear and moist. Mucous membranes are dry.  Eyes: Conjunctivae and EOM are normal. Pupils are equal, round, and reactive to light.  Neck: Normal range of motion. Neck supple.  Cardiovascular: Regular rhythm and intact distal pulses.  Tachycardia present.   No murmur  heard. Pulmonary/Chest: Effort normal and breath sounds normal. No respiratory distress. She has no wheezes. She has no rales.  Abdominal: Soft. Normal appearance and bowel sounds are normal. She exhibits no distension. There is tenderness in the right lower quadrant. There is no rebound, no guarding and no CVA tenderness. No hernia.  Musculoskeletal: Normal range of motion. She exhibits no edema and no tenderness.  Neurological: She is alert and oriented to person, place, and time.  Skin: Skin is warm and dry. No rash noted. No erythema.  Psychiatric: She has a normal mood and affect. Her behavior is normal.    ED Course  Procedures (including critical care time)  Labs Reviewed  CBC WITH DIFFERENTIAL - Abnormal; Notable for the following:    RBC 3.77 (*)     MCV 101.6 (*)  MCH 34.7 (*)     All other components within normal limits  COMPREHENSIVE METABOLIC PANEL - Abnormal; Notable for the following:    Potassium 3.3 (*)     All other components within normal limits  URINALYSIS, ROUTINE W REFLEX MICROSCOPIC   No results found.   1. Vomiting and diarrhea   2. Colitis       MDM   Patient with a history of microscopic colitis with recurrent attacks causing numerous diarrhea stools, vomiting and right lower quadrant pain. Patient was admitted to [facility] last month for the same. She states that she takes several anti-colitis medications and not appearing on her medication list. She took Lomotil yesterday and on Tuesday but not today. She's had several episodes of vomiting as well. On exam patient has no peritoneal findings. She is most tender in the right lower quadrant and she does have an appendix however her history is most suggestive of recurrent colitis and she states this is the same pain that she always has.  Will hydrate and give pain and nausea control. Also given bentyl.  CBC, CMP pending to rule out hypokalemia. Patient spoke with her GI doctor at [facility] in  [place] but has not been called back at to see if she can get a sooner appointment.  9:20 AM Patient only mildly hypokalemic at 3.3. She was replaced orally and given Lomotil and a second bag of IV fluid.   11:26 AM Patient is tolerating by mouth isn't feeling better. She was discharged home to follow up with GI tomorrow  Gwyneth Sprout, MD 11/10/11 2956  Gwyneth Sprout, MD 11/10/11 2130  Gwyneth Sprout, MD 11/10/11 1128

## 2011-11-10 NOTE — ED Notes (Signed)
Pt amb to room 11 with quick steady gait in nad. Pt reports abd pain, vomiting and diarrhea x Monday, her usual colitis sx. Pt states she is followed by a gi specialist in [place] who she called this am, and was told to come to ed for fluids and eval. Pt is alert and cooperative, dr. Anitra Lauth at bedside for exam.

## 2011-11-11 ENCOUNTER — Institutional Professional Consult (permissible substitution): Payer: 59 | Admitting: Internal Medicine

## 2011-12-07 ENCOUNTER — Encounter (HOSPITAL_BASED_OUTPATIENT_CLINIC_OR_DEPARTMENT_OTHER): Payer: Self-pay | Admitting: *Deleted

## 2011-12-07 ENCOUNTER — Emergency Department (HOSPITAL_BASED_OUTPATIENT_CLINIC_OR_DEPARTMENT_OTHER)
Admission: EM | Admit: 2011-12-07 | Discharge: 2011-12-07 | Disposition: A | Discharge status: Home / Self Care | Payer: 59 | Attending: Emergency Medicine | Admitting: Emergency Medicine

## 2011-12-07 ENCOUNTER — Emergency Department (HOSPITAL_BASED_OUTPATIENT_CLINIC_OR_DEPARTMENT_OTHER): Payer: 59

## 2011-12-07 DIAGNOSIS — R109 Unspecified abdominal pain: Secondary | ICD-10-CM | POA: Insufficient documentation

## 2011-12-07 DIAGNOSIS — K5289 Other specified noninfective gastroenteritis and colitis: Secondary | ICD-10-CM | POA: Insufficient documentation

## 2011-12-07 DIAGNOSIS — R0789 Other chest pain: Secondary | ICD-10-CM | POA: Insufficient documentation

## 2011-12-07 DIAGNOSIS — K52831 Collagenous colitis: Secondary | ICD-10-CM

## 2011-12-07 LAB — COMPREHENSIVE METABOLIC PANEL
ALT: 11 U/L (ref 0–35)
Albumin: 3.4 g/dL — ABNORMAL LOW (ref 3.5–5.2)
Alkaline Phosphatase: 84 U/L (ref 39–117)
BUN: 14 mg/dL (ref 6–23)
Chloride: 105 mEq/L (ref 96–112)
Glucose, Bld: 104 mg/dL — ABNORMAL HIGH (ref 70–99)
Potassium: 4.8 mEq/L (ref 3.5–5.1)
Sodium: 139 mEq/L (ref 135–145)
Total Bilirubin: 0.1 mg/dL — ABNORMAL LOW (ref 0.3–1.2)
Total Protein: 6.2 g/dL (ref 6.0–8.3)

## 2011-12-07 LAB — CBC WITH DIFFERENTIAL/PLATELET
Basophils Relative: 0 % (ref 0–1)
Eosinophils Absolute: 0.2 10*3/uL (ref 0.0–0.7)
Hemoglobin: 11.7 g/dL — ABNORMAL LOW (ref 12.0–15.0)
Lymphs Abs: 1.7 10*3/uL (ref 0.7–4.0)
Monocytes Relative: 5 % (ref 3–12)
Neutro Abs: 6.9 10*3/uL (ref 1.7–7.7)
Neutrophils Relative %: 75 % (ref 43–77)
Platelets: 232 10*3/uL (ref 150–400)
RBC: 3.32 MIL/uL — ABNORMAL LOW (ref 3.87–5.11)

## 2011-12-07 LAB — LIPASE, BLOOD: Lipase: 13 U/L (ref 11–59)

## 2011-12-07 LAB — URINALYSIS, ROUTINE W REFLEX MICROSCOPIC
Leukocytes, UA: NEGATIVE
Nitrite: NEGATIVE
Specific Gravity, Urine: 1.027 (ref 1.005–1.030)
Urobilinogen, UA: 0.2 mg/dL (ref 0.0–1.0)
pH: 6 (ref 5.0–8.0)

## 2011-12-07 MED ORDER — SODIUM CHLORIDE 0.9 % IV BOLUS (SEPSIS)
1000.0000 mL | Freq: Once | INTRAVENOUS | Status: AC
  Administered 2011-12-07: 1000 mL via INTRAVENOUS

## 2011-12-07 MED ORDER — PROMETHAZINE HCL 25 MG/ML IJ SOLN
25.0000 mg | Freq: Once | INTRAMUSCULAR | Status: AC
  Administered 2011-12-07: 25 mg via INTRAVENOUS
  Filled 2011-12-07 (×2): qty 1

## 2011-12-07 MED ORDER — PROMETHAZINE HCL 25 MG/ML IJ SOLN
25.0000 mg | Freq: Once | INTRAMUSCULAR | Status: AC
  Administered 2011-12-07: 25 mg via INTRAVENOUS
  Filled 2011-12-07: qty 1

## 2011-12-07 MED ORDER — HYDROMORPHONE HCL PF 1 MG/ML IJ SOLN
1.0000 mg | Freq: Once | INTRAMUSCULAR | Status: AC
  Administered 2011-12-07: 1 mg via INTRAVENOUS
  Filled 2011-12-07 (×2): qty 1

## 2011-12-07 MED ORDER — SODIUM CHLORIDE 0.9 % IV SOLN: INTRAVENOUS | Status: DC

## 2011-12-07 MED ORDER — HYDROMORPHONE HCL PF 1 MG/ML IJ SOLN
1.0000 mg | Freq: Once | INTRAMUSCULAR | Status: AC
  Administered 2011-12-07: 1 mg via INTRAVENOUS
  Filled 2011-12-07: qty 1

## 2011-12-07 NOTE — ED Notes (Signed)
Pt returned from radiology.

## 2011-12-07 NOTE — ED Provider Notes (Signed)
History     CSN: [id number]  Arrival date & time 12/07/11  1502   First MD Initiated Contact with Patient 12/07/11 1526      Chief Complaint  Patient presents with  . Abdominal Pain    (Consider location/radiation/quality/duration/timing/severity/associated sxs/prior treatment) Patient is a 46 y.o. female presenting with abdominal pain. The history is provided by the patient.  Abdominal Pain The primary symptoms of the illness include abdominal pain.  pt here with LLQ pain similar to her prior colitis. Old charts reviewed and pt has long standing h/o same. Notes non-bilious emesis without bloody stools--no fever or urinary sx--used phenergan and percocet at home without reilef--has scheduled appointment next week with her gi md--nothing makes her sx better or worse and pain is characterized as sharp  Past Medical History  Diagnosis Date  . Collagenous colitis   . WPW (Wolff-Parkinson-White syndrome)   . Kidney stone   . Addison disease   . Thyroid disease   . Clostridium difficile diarrhea   . Anxiety and depression   . Chronic diarrhea   . Chronic back pain   . Drug-seeking behavior   . Irritable bowel syndrome (IBS)   . Anemia     Past Surgical History  Procedure Date  . Cholecystectomy   . Tubal ligation   . Lithotripsy   . Sphincterotomy   . Cardiac electrophysiology mapping and ablation     for WPW  . Video bronchoscopy 08/31/2011    Procedure: VIDEO BRONCHOSCOPY WITH FLUORO;  Surgeon: Storm Frisk, MD;  Location: [facility];  Service: Cardiopulmonary;  Laterality: N/A;    Family History  Problem Relation Age of Onset  . Prostate cancer Father   . Coronary artery disease Maternal Grandfather     History  Substance Use Topics  . Smoking status: Current Everyday Smoker -- 0.1 packs/day for 30 years    Types: Cigarettes    Last Attempt to Quit: 09/03/2011  . Smokeless tobacco: Never Used  . Alcohol Use: No    OB History    Grav Para Term Preterm  Abortions TAB SAB Ect Mult Living   3 3              Review of Systems  Gastrointestinal: Positive for abdominal pain.  All other systems reviewed and are negative.    Allergies  Ambien; Nsaids; and Zofran  Home Medications   Current Outpatient Rx  Name Route Sig Dispense Refill  . ALBUTEROL SULFATE HFA 108 (90 BASE) MCG/ACT IN AERS Inhalation Inhale 1 puff into the lungs every 6 (six) hours as needed. For shortness of breath.    . BUDESONIDE-FORMOTEROL FUMARATE 160-4.5 MCG/ACT IN AERO Inhalation Inhale 2 puffs into the lungs 2 (two) times daily. 1 Inhaler 12  . CLONAZEPAM 1 MG PO TABS Oral Take 1 mg by mouth 2 (two) times daily as needed. For seizures    . COLESTIPOL HCL 1 G PO TABS Oral Take 1 g by mouth 2 (two) times daily.    Marland Kitchen DICYCLOMINE HCL 20 MG PO TABS Oral Take 20 mg by mouth 2 (two) times daily.    Marland Kitchen HYDROCODONE-ACETAMINOPHEN 10-300 MG PO TABS Oral Take 1 tablet by mouth Once daily as needed. For pain.    Marland Kitchen HYDROCORTISONE 10 MG PO TABS Oral Take 10 mg by mouth daily.    Marland Kitchen LEVOTHYROXINE SODIUM 50 MCG PO TABS Oral Take 50 mcg by mouth daily.    . ADULT MULTIVITAMIN W/MINERALS CH Oral Take 1 tablet by  mouth daily.     Marland Kitchen NORTRIPTYLINE HCL 10 MG PO CAPS Oral Take 10 mg by mouth daily. Take one capsule for 7 days, then take every other day then stop.     Marland Kitchen PANTOPRAZOLE SODIUM 40 MG PO TBEC Oral Take 1 tablet (40 mg total) by mouth daily. 30 tablet 1  . POTASSIUM CHLORIDE CRYS ER 20 MEQ PO TBCR Oral Take 20 mEq by mouth daily.    Marland Kitchen PROMETHAZINE HCL 25 MG PO TABS Oral Take 1 tablet by mouth Once daily as needed. For nausea    . RAMELTEON 8 MG PO TABS Oral Take 8 mg by mouth daily. Take one to two tablets daily, as needed for sleep.    . SERTRALINE HCL 50 MG PO TABS Oral Take 1 tablet (50 mg total) by mouth daily. Take one-half tablet (25 mg) for 7 days, then 50 mg for 7 days, then one and a half tablets (75 mg) for 7 days, then 2 tablets  (100 mg) daily. 70 tablet 1    BP 123/83   Pulse 98  Temp 98 F (36.7 C)  Resp 16  Ht 5\' 4"  (1.626 m)  Wt 133 lb (60.328 kg)  BMI 22.83 kg/m2  SpO2 98%  Physical Exam  Nursing note and vitals reviewed. Constitutional: She is oriented to person, place, and time. Vital signs are normal. She appears well-developed and well-nourished.  Non-toxic appearance. No distress.  HENT:  Head: Normocephalic and atraumatic.  Eyes: Conjunctivae, EOM and lids are normal. Pupils are equal, round, and reactive to light.  Neck: Normal range of motion. Neck supple. No tracheal deviation present. No mass present.  Cardiovascular: Normal rate, regular rhythm and normal heart sounds.  Exam reveals no gallop.   No murmur heard. Pulmonary/Chest: Effort normal and breath sounds normal. No stridor. No respiratory distress. She has no decreased breath sounds. She has no wheezes. She has no rhonchi. She has no rales.  Abdominal: Soft. Normal appearance and bowel sounds are normal. She exhibits no distension. There is no tenderness. There is no rebound and no CVA tenderness.  Musculoskeletal: Normal range of motion. She exhibits no edema and no tenderness.  Neurological: She is alert and oriented to person, place, and time. She has normal strength. No cranial nerve deficit or sensory deficit. GCS eye subscore is 4. GCS verbal subscore is 5. GCS motor subscore is 6.  Skin: Skin is warm and dry. No abrasion and no rash noted.  Psychiatric: She has a normal mood and affect. Her speech is normal and behavior is normal.    ED Course  Procedures (including critical care time)   Labs Reviewed  URINALYSIS, ROUTINE W REFLEX MICROSCOPIC  CBC WITH DIFFERENTIAL  COMPREHENSIVE METABOLIC PANEL  LIPASE, BLOOD   No results found.   No diagnosis found.    MDM  5:21 PM Patient given medications for her abdominal pain and he felt better. Patient not endorses 3 to four-day history of intermittent chest pain. Will obtain workup for this.   6:23 PM  Date:  12/07/2011  Rate: 89  Rhythm: normal sinus rhythm  QRS Axis: normal  Intervals: normal  ST/T Wave abnormalities: normal  Conduction Disutrbances:none  Narrative Interpretation:   Old EKG Reviewed: unchanged  Patient without concern for ACS. Her chest pain very atypical lasting for seconds. No concern for PE. She states she thinks it is her chronic abdominal pain causing her symptoms. Abdominal exam repeated at time of discharge and is nonsurgical. She is  to followup with her gastroenterologist this week       Toy Baker, MD 12/07/11 1826

## 2011-12-07 NOTE — ED Notes (Signed)
Pt c/o abd pain with n/v hx colitis states percocet and phenergan not working which is prescribed by GI

## 2011-12-27 ENCOUNTER — Encounter (HOSPITAL_COMMUNITY): Payer: Self-pay

## 2011-12-27 ENCOUNTER — Emergency Department (HOSPITAL_COMMUNITY): Payer: 59

## 2011-12-27 ENCOUNTER — Emergency Department (HOSPITAL_COMMUNITY)
Admission: EM | Admit: 2011-12-27 | Discharge: 2011-12-27 | Disposition: A | Discharge status: Home / Self Care | Payer: 59 | Attending: Emergency Medicine | Admitting: Emergency Medicine

## 2011-12-27 DIAGNOSIS — G8929 Other chronic pain: Secondary | ICD-10-CM | POA: Insufficient documentation

## 2011-12-27 DIAGNOSIS — R110 Nausea: Secondary | ICD-10-CM | POA: Insufficient documentation

## 2011-12-27 DIAGNOSIS — R197 Diarrhea, unspecified: Secondary | ICD-10-CM | POA: Insufficient documentation

## 2011-12-27 DIAGNOSIS — R300 Dysuria: Secondary | ICD-10-CM | POA: Insufficient documentation

## 2011-12-27 DIAGNOSIS — Z79899 Other long term (current) drug therapy: Secondary | ICD-10-CM | POA: Insufficient documentation

## 2011-12-27 DIAGNOSIS — R109 Unspecified abdominal pain: Secondary | ICD-10-CM

## 2011-12-27 DIAGNOSIS — Z9089 Acquired absence of other organs: Secondary | ICD-10-CM | POA: Insufficient documentation

## 2011-12-27 DIAGNOSIS — E079 Disorder of thyroid, unspecified: Secondary | ICD-10-CM | POA: Insufficient documentation

## 2011-12-27 HISTORY — DX: Acute pancreatitis without necrosis or infection, unspecified: K85.90

## 2011-12-27 LAB — CBC WITH DIFFERENTIAL/PLATELET
Basophils Absolute: 0 10*3/uL (ref 0.0–0.1)
HCT: 41.8 % (ref 36.0–46.0)
Lymphocytes Relative: 19 % (ref 12–46)
Neutro Abs: 6.2 10*3/uL (ref 1.7–7.7)
Platelets: 255 10*3/uL (ref 150–400)
RDW: 13.9 % (ref 11.5–15.5)
WBC: 8.5 10*3/uL (ref 4.0–10.5)

## 2011-12-27 LAB — COMPREHENSIVE METABOLIC PANEL
Albumin: 4 g/dL (ref 3.5–5.2)
Alkaline Phosphatase: 99 U/L (ref 39–117)
BUN: 12 mg/dL (ref 6–23)
Chloride: 104 mEq/L (ref 96–112)
Creatinine, Ser: 0.69 mg/dL (ref 0.50–1.10)
GFR calc Af Amer: >90 mL/min (ref >90)
GFR calc non Af Amer: >90 mL/min (ref >90)
Glucose, Bld: 92 mg/dL (ref 70–99)
Potassium: 3.8 mEq/L (ref 3.5–5.1)
Total Bilirubin: 0.3 mg/dL (ref 0.3–1.2)

## 2011-12-27 LAB — URINALYSIS, ROUTINE W REFLEX MICROSCOPIC
Glucose, UA: NEGATIVE mg/dL
Leukocytes, UA: NEGATIVE
Protein, ur: NEGATIVE mg/dL
Urobilinogen, UA: 0.2 mg/dL (ref 0.0–1.0)

## 2011-12-27 LAB — LIPASE, BLOOD: Lipase: 17 U/L (ref 11–59)

## 2011-12-27 MED ORDER — PROMETHAZINE HCL 25 MG PO TABS: 25.0000 mg | ORAL_TABLET | Freq: Four times a day (QID) | ORAL | Status: DC | PRN

## 2011-12-27 MED ORDER — SODIUM CHLORIDE 0.9 % IV SOLN
1000.0000 mL | Freq: Once | INTRAVENOUS | Status: AC
  Administered 2011-12-27: 1000 mL via INTRAVENOUS

## 2011-12-27 MED ORDER — METOCLOPRAMIDE HCL 5 MG/ML IJ SOLN
10.0000 mg | Freq: Once | INTRAMUSCULAR | Status: AC
  Administered 2011-12-27: 10 mg via INTRAVENOUS
  Filled 2011-12-27: qty 2

## 2011-12-27 MED ORDER — TRAMADOL HCL 50 MG PO TABS: 50.0000 mg | ORAL_TABLET | Freq: Four times a day (QID) | ORAL | Status: DC | PRN

## 2011-12-27 MED ORDER — HYDROMORPHONE HCL PF 1 MG/ML IJ SOLN
1.0000 mg | Freq: Once | INTRAMUSCULAR | Status: AC
  Administered 2011-12-27: 1 mg via INTRAVENOUS
  Filled 2011-12-27: qty 1

## 2011-12-27 MED ORDER — SODIUM CHLORIDE 0.9 % IV SOLN
1000.0000 mL | INTRAVENOUS | Status: DC
  Administered 2011-12-27: 1000 mL via INTRAVENOUS

## 2011-12-27 MED ORDER — HYDROCORTISONE SOD SUCCINATE 100 MG IJ SOLR
100.0000 mg | Freq: Once | INTRAMUSCULAR | Status: AC
  Administered 2011-12-27: 100 mg via INTRAVENOUS
  Filled 2011-12-27: qty 2

## 2011-12-27 NOTE — ED Notes (Signed)
Diarrhea x couple days, epigastrica pain started yesterday, nausea, no vomiting, pain 8/10, describe the pain as somebody in there grabbing, hx of colitis but sts the pain feels different.

## 2011-12-27 NOTE — ED Provider Notes (Signed)
History     CSN: [id number]  Arrival date & time 12/27/11  1047 First MD Initiated Contact with Patient 12/27/11 1115      Chief Complaint  Patient presents with  . Abdominal Pain  . Diarrhea    HPI Patient presents to the emergency room with complaints of recurrent abdominal pain. Patient has a history of chronic abdominal pain problems. She has history of recurrent colitis associated with Addison's disease and kidney stones. Patient states she started having diarrhea a couple days ago. She is now developed epigastric and diffuse abdominal pain. She has not had any vomiting although she has been nauseated. She feels that the abdominal pain is a grabbing type discomfort that is 8/10 in severity. She has history of colitis but feels this is different than her prior episodes. She sees a GI Dr. [facility] came to the emergency room 0 because she lives in [place] area. When she called her doctor she was told to come to the emergency room.  Past Medical History  Diagnosis Date  . Collagenous colitis   . WPW (Wolff-Parkinson-White syndrome)   . Kidney stone   . Addison disease   . Thyroid disease   . Clostridium difficile diarrhea   . Anxiety and depression   . Chronic diarrhea   . Chronic back pain   . Drug-seeking behavior   . Irritable bowel syndrome (IBS)   . Anemia   . Kidney stone   . Pancreatitis     Past Surgical History  Procedure Date  . Cholecystectomy   . Tubal ligation   . Lithotripsy   . Sphincterotomy   . Cardiac electrophysiology mapping and ablation     for WPW  . Video bronchoscopy 08/31/2011    Procedure: VIDEO BRONCHOSCOPY WITH FLUORO;  Surgeon: Storm Frisk, MD;  Location: [facility];  Service: Cardiopulmonary;  Laterality: N/A;    Family History  Problem Relation Age of Onset  . Prostate cancer Father   . Coronary artery disease Maternal Grandfather     History  Substance Use Topics  . Smoking status: Current Every Day Smoker -- 0.1  packs/day for 30 years    Types: Cigarettes    Last Attempt to Quit: 09/03/2011  . Smokeless tobacco: Never Used  . Alcohol Use: No    OB History    Grav Para Term Preterm Abortions TAB SAB Ect Mult Living   3 3              Review of Systems  Constitutional: Negative for fever.  Gastrointestinal: Positive for nausea, abdominal pain and diarrhea. Negative for vomiting.  Genitourinary: Positive for dysuria.  All other systems reviewed and are negative.    Allergies  Ambien; Nsaids; and Zofran  Home Medications   Current Outpatient Rx  Name Route Sig Dispense Refill  . ALBUTEROL SULFATE HFA 108 (90 BASE) MCG/ACT IN AERS Inhalation Inhale 1 puff into the lungs every 6 (six) hours as needed. For shortness of breath.    . BUDESONIDE-FORMOTEROL FUMARATE 160-4.5 MCG/ACT IN AERO Inhalation Inhale 2 puffs into the lungs 2 (two) times daily. 1 Inhaler 12  . COLESTIPOL HCL 1 G PO TABS Oral Take 1 g by mouth 2 (two) times daily.    Marland Kitchen DICYCLOMINE HCL 20 MG PO TABS Oral Take 20 mg by mouth 2 (two) times daily.    Marland Kitchen HYDROCODONE-ACETAMINOPHEN 10-300 MG PO TABS Oral Take 1 tablet by mouth Once daily as needed. For pain.    Marland Kitchen HYDROCORTISONE  10 MG PO TABS Oral Take 10 mg by mouth daily.    Marland Kitchen LEVOTHYROXINE SODIUM 50 MCG PO TABS Oral Take 50 mcg by mouth daily.    . ADULT MULTIVITAMIN W/MINERALS CH Oral Take 1 tablet by mouth daily.     Marland Kitchen NORTRIPTYLINE HCL 10 MG PO CAPS Oral Take 10 mg by mouth daily. Take one capsule for 7 days, then take every other day then stop.     Marland Kitchen PANTOPRAZOLE SODIUM 40 MG PO TBEC Oral Take 1 tablet (40 mg total) by mouth daily. 30 tablet 1  . POTASSIUM CHLORIDE CRYS ER 20 MEQ PO TBCR Oral Take 20 mEq by mouth daily.    Marland Kitchen PROMETHAZINE HCL 25 MG PO TABS Oral Take 1 tablet by mouth Once daily as needed. For nausea    . RAMELTEON 8 MG PO TABS Oral Take 8 mg by mouth daily. Take one to two tablets daily, as needed for sleep.    . SERTRALINE HCL 50 MG PO TABS Oral Take 1  tablet (50 mg total) by mouth daily. Take one-half tablet (25 mg) for 7 days, then 50 mg for 7 days, then one and a half tablets (75 mg) for 7 days, then 2 tablets  (100 mg) daily. 70 tablet 1    BP 115/91  Pulse 81  Temp 98 F (36.7 C)  Resp 18  SpO2 99%  LMP 10/11/2011  Physical Exam  Nursing note and vitals reviewed. Constitutional:       Uncomfortable appearing  HENT:  Head: Normocephalic and atraumatic.  Right Ear: External ear normal.  Left Ear: External ear normal.  Eyes: Conjunctivae normal are normal. Right eye exhibits no discharge. Left eye exhibits no discharge. No scleral icterus.  Neck: Neck supple. No tracheal deviation present.  Cardiovascular: Normal rate, regular rhythm and intact distal pulses.   Pulmonary/Chest: Effort normal and breath sounds normal. No stridor. No respiratory distress. She has no wheezes. She has no rales.  Abdominal: Soft. Bowel sounds are normal. She exhibits no distension, no ascites and no mass. There is generalized tenderness. There is no rebound, no guarding and no CVA tenderness. No hernia.  Musculoskeletal: She exhibits no edema and no tenderness.  Neurological: She is alert. She has normal strength. No sensory deficit. Cranial nerve deficit:  no gross defecits noted. She exhibits normal muscle tone. She displays no seizure activity. Coordination normal.  Skin: Skin is warm and dry. No rash noted.  Psychiatric: She has a normal mood and affect.    ED Course  Procedures (including critical care time)  Labs Reviewed  URINALYSIS, ROUTINE W REFLEX MICROSCOPIC - Abnormal; Notable for the following:    APPearance CLOUDY (*)     All other components within normal limits  CBC WITH DIFFERENTIAL - Abnormal; Notable for the following:    MCV 103.0 (*)     MCH 35.2 (*)     All other components within normal limits  COMPREHENSIVE METABOLIC PANEL  LIPASE, BLOOD   Dg Abd Acute W/chest  12/27/2011  *RADIOLOGY REPORT*  Clinical Data: Diffuse  abdominal pain with nausea, diarrhea and vomiting.  History of colitis.  ACUTE ABDOMEN SERIES (ABDOMEN 2 VIEW & CHEST 1 VIEW)  Comparison: Acute abdominal series 12/07/2011.  Abdominal pelvic CT 08/27/2011.  Findings: The heart size and mediastinal contours are normal. The lungs are clear. There is no pleural effusion or pneumothorax. No acute osseous findings are identified.  Azygos fissure is noted.  There is no significant bowel distension,  bowel wall thickening or free intraperitoneal air.  Cholecystectomy and bilateral tubal ligation clips are noted. There are possible small renal calculi bilaterally.  There are no suspicious osseous findings.  IMPRESSION: No acute cardiopulmonary or abdominal findings identified.   Original Report Authenticated By: Gerrianne Scale, M.D.      1. Abdominal pain       MDM  Pt without signs of acute abnormality on evaluation today.  She does have history of recurrent abdominal pain issues.  IV stress dose steroids given today considering her addison disease.  No vomiting here in the ED.  At this time there does not appear to be any evidence of an acute emergency medical condition and the patient appears stable for discharge with appropriate outpatient follow up.      Celene Kras, MD 12/27/11 [phone]

## 2012-01-05 ENCOUNTER — Inpatient Hospital Stay (HOSPITAL_BASED_OUTPATIENT_CLINIC_OR_DEPARTMENT_OTHER)
Admission: EM | Admit: 2012-01-05 | Discharge: 2012-01-07 | DRG: 392 | Disposition: A | Discharge status: Home / Self Care | Payer: 59 | Attending: Internal Medicine | Admitting: Internal Medicine

## 2012-01-05 ENCOUNTER — Encounter (HOSPITAL_BASED_OUTPATIENT_CLINIC_OR_DEPARTMENT_OTHER): Payer: Self-pay | Admitting: Emergency Medicine

## 2012-01-05 DIAGNOSIS — J4489 Other specified chronic obstructive pulmonary disease: Secondary | ICD-10-CM | POA: Diagnosis present

## 2012-01-05 DIAGNOSIS — E2740 Unspecified adrenocortical insufficiency: Secondary | ICD-10-CM

## 2012-01-05 DIAGNOSIS — R1110 Vomiting, unspecified: Secondary | ICD-10-CM

## 2012-01-05 DIAGNOSIS — K52831 Collagenous colitis: Secondary | ICD-10-CM | POA: Diagnosis present

## 2012-01-05 DIAGNOSIS — Z8249 Family history of ischemic heart disease and other diseases of the circulatory system: Secondary | ICD-10-CM

## 2012-01-05 DIAGNOSIS — E2749 Other adrenocortical insufficiency: Secondary | ICD-10-CM | POA: Diagnosis present

## 2012-01-05 DIAGNOSIS — K589 Irritable bowel syndrome without diarrhea: Secondary | ICD-10-CM | POA: Diagnosis present

## 2012-01-05 DIAGNOSIS — E039 Hypothyroidism, unspecified: Secondary | ICD-10-CM | POA: Diagnosis present

## 2012-01-05 DIAGNOSIS — Z9089 Acquired absence of other organs: Secondary | ICD-10-CM

## 2012-01-05 DIAGNOSIS — I456 Pre-excitation syndrome: Secondary | ICD-10-CM | POA: Diagnosis present

## 2012-01-05 DIAGNOSIS — K529 Noninfective gastroenteritis and colitis, unspecified: Secondary | ICD-10-CM

## 2012-01-05 DIAGNOSIS — F17200 Nicotine dependence, unspecified, uncomplicated: Secondary | ICD-10-CM | POA: Diagnosis present

## 2012-01-05 DIAGNOSIS — Z720 Tobacco use: Secondary | ICD-10-CM | POA: Diagnosis present

## 2012-01-05 DIAGNOSIS — J449 Chronic obstructive pulmonary disease, unspecified: Secondary | ICD-10-CM | POA: Diagnosis present

## 2012-01-05 DIAGNOSIS — R197 Diarrhea, unspecified: Principal | ICD-10-CM | POA: Diagnosis present

## 2012-01-05 DIAGNOSIS — R109 Unspecified abdominal pain: Secondary | ICD-10-CM

## 2012-01-05 LAB — CBC WITH DIFFERENTIAL/PLATELET
HCT: 38 % (ref 36.0–46.0)
Hemoglobin: 13.1 g/dL (ref 12.0–15.0)
Lymphocytes Relative: 24 % (ref 12–46)
Monocytes Absolute: 0.6 10*3/uL (ref 0.1–1.0)
Monocytes Relative: 7 % (ref 3–12)
Neutro Abs: 6.3 10*3/uL (ref 1.7–7.7)
RBC: 3.62 MIL/uL — ABNORMAL LOW (ref 3.87–5.11)
WBC: 9.6 10*3/uL (ref 4.0–10.5)

## 2012-01-05 LAB — COMPREHENSIVE METABOLIC PANEL
AST: 13 U/L (ref 0–37)
Alkaline Phosphatase: 93 U/L (ref 39–117)
BUN: 14 mg/dL (ref 6–23)
CO2: 18 mEq/L — ABNORMAL LOW (ref 19–32)
Chloride: 105 mEq/L (ref 96–112)
Creatinine, Ser: 0.8 mg/dL (ref 0.50–1.10)
GFR calc non Af Amer: 87 mL/min — ABNORMAL LOW (ref >90)
Total Bilirubin: 0.3 mg/dL (ref 0.3–1.2)

## 2012-01-05 LAB — URINALYSIS, ROUTINE W REFLEX MICROSCOPIC
Bilirubin Urine: NEGATIVE
Glucose, UA: NEGATIVE mg/dL
Ketones, ur: NEGATIVE mg/dL
Leukocytes, UA: NEGATIVE
Protein, ur: NEGATIVE mg/dL

## 2012-01-05 MED ORDER — SODIUM CHLORIDE 0.9 % IV SOLN
INTRAVENOUS | Status: AC
  Administered 2012-01-05: 23:00:00 via INTRAVENOUS

## 2012-01-05 MED ORDER — PROMETHAZINE HCL 25 MG/ML IJ SOLN
12.5000 mg | Freq: Once | INTRAMUSCULAR | Status: AC
  Administered 2012-01-05: 12.5 mg via INTRAVENOUS
  Filled 2012-01-05: qty 1

## 2012-01-05 MED ORDER — MORPHINE SULFATE 4 MG/ML IJ SOLN
4.0000 mg | Freq: Once | INTRAMUSCULAR | Status: AC
  Administered 2012-01-05: 4 mg via INTRAVENOUS
  Filled 2012-01-05: qty 1

## 2012-01-05 MED ORDER — SODIUM CHLORIDE 0.9 % IV BOLUS (SEPSIS)
1000.0000 mL | Freq: Once | INTRAVENOUS | Status: AC
  Administered 2012-01-05: 1000 mL via INTRAVENOUS

## 2012-01-05 NOTE — ED Provider Notes (Signed)
History     CSN: [id number]  Arrival date & time 01/05/12  1710   First MD Initiated Contact with Patient 01/05/12 1747      Chief Complaint  Patient presents with  . Abdominal Pain  . Diarrhea    (Consider location/radiation/quality/duration/timing/severity/associated sxs/prior treatment) HPI Comments: Patient with history of "collagenous colitis".  Presents with right sided abdominal pain she believes to be a flareup of this.  She is followed by a GI doctor with [facility].  She denies fevers, chills, urinary complaints, bloody stool or bowel movements.    Patient is a 46 y.o. female presenting with abdominal pain. The history is provided by the patient.  Abdominal Pain The primary symptoms of the illness include abdominal pain, nausea and vomiting. The primary symptoms of the illness do not include fever, diarrhea, hematochezia or dysuria. Episode onset: one week ago. The problem has been gradually worsening.  The patient has not had a change in bowel habit. Symptoms associated with the illness do not include urgency, frequency or back pain.    Past Medical History  Diagnosis Date  . Collagenous colitis   . WPW (Wolff-Parkinson-White syndrome)   . Kidney stone   . Addison disease   . Thyroid disease   . Clostridium difficile diarrhea   . Anxiety and depression   . Chronic diarrhea   . Chronic back pain   . Drug-seeking behavior   . Irritable bowel syndrome (IBS)   . Anemia   . Kidney stone   . Pancreatitis     Past Surgical History  Procedure Date  . Cholecystectomy   . Tubal ligation   . Lithotripsy   . Sphincterotomy   . Cardiac electrophysiology mapping and ablation     for WPW  . Video bronchoscopy 08/31/2011    Procedure: VIDEO BRONCHOSCOPY WITH FLUORO;  Surgeon: Storm Frisk, MD;  Location: [facility];  Service: Cardiopulmonary;  Laterality: N/A;    Family History  Problem Relation Age of Onset  . Prostate cancer Father   . Coronary artery disease  Maternal Grandfather     History  Substance Use Topics  . Smoking status: Current Every Day Smoker -- 0.5 packs/day for 30 years    Types: Cigarettes    Last Attempt to Quit: 09/03/2011  . Smokeless tobacco: Never Used  . Alcohol Use: No    OB History    Grav Para Term Preterm Abortions TAB SAB Ect Mult Living   3 3              Review of Systems  Constitutional: Negative for fever.  Gastrointestinal: Positive for nausea, vomiting and abdominal pain. Negative for diarrhea and hematochezia.  Genitourinary: Negative for dysuria, urgency and frequency.  Musculoskeletal: Negative for back pain.  All other systems reviewed and are negative.    Allergies  Ambien; Nsaids; and Zofran  Home Medications   Current Outpatient Rx  Name Route Sig Dispense Refill  . LORAZEPAM 1 MG PO TABS Oral Take 1 mg by mouth every 8 (eight) hours as needed.    . ALBUTEROL SULFATE HFA 108 (90 BASE) MCG/ACT IN AERS Inhalation Inhale 1 puff into the lungs every 6 (six) hours as needed. For shortness of breath.    . BUDESONIDE-FORMOTEROL FUMARATE 160-4.5 MCG/ACT IN AERO Inhalation Inhale 2 puffs into the lungs 2 (two) times daily. 1 Inhaler 12  . COLESTIPOL HCL 1 G PO TABS Oral Take 1 g by mouth 2 (two) times daily.    Marland Kitchen DICYCLOMINE  HCL 20 MG PO TABS Oral Take 20 mg by mouth 2 (two) times daily.    Marland Kitchen HYDROCODONE-ACETAMINOPHEN 10-300 MG PO TABS Oral Take 1 tablet by mouth Once daily as needed. For pain.    Marland Kitchen HYDROCORTISONE 10 MG PO TABS Oral Take 10 mg by mouth daily.    Marland Kitchen LEVOTHYROXINE SODIUM 50 MCG PO TABS Oral Take 50 mcg by mouth daily.    . ADULT MULTIVITAMIN W/MINERALS CH Oral Take 1 tablet by mouth daily.     Marland Kitchen NORTRIPTYLINE HCL 10 MG PO CAPS Oral Take 10 mg by mouth daily.     Marland Kitchen PANTOPRAZOLE SODIUM 40 MG PO TBEC Oral Take 1 tablet (40 mg total) by mouth daily. 30 tablet 1  . POTASSIUM CHLORIDE CRYS ER 20 MEQ PO TBCR Oral Take 20 mEq by mouth daily.    Marland Kitchen PROMETHAZINE HCL 25 MG PO TABS Oral Take 1  tablet by mouth Once daily as needed. For nausea    . PROMETHAZINE HCL 25 MG PO TABS Oral Take 1 tablet (25 mg total) by mouth every 6 (six) hours as needed for nausea. 30 tablet 0  . TRAMADOL HCL 50 MG PO TABS Oral Take 1 tablet (50 mg total) by mouth every 6 (six) hours as needed for pain. 15 tablet 0    BP 154/103  Pulse 92  Temp 98.2 F (36.8 C) (Oral)  Resp 16  Ht 5\' 4"  (1.626 m)  Wt 133 lb (60.328 kg)  BMI 22.83 kg/m2  SpO2 98%  LMP 01/05/2012  Physical Exam  Nursing note and vitals reviewed. Constitutional: She is oriented to person, place, and time. She appears well-developed and well-nourished. No distress.  HENT:  Head: Normocephalic and atraumatic.  Neck: Normal range of motion. Neck supple.  Cardiovascular: Normal rate and regular rhythm.  Exam reveals no gallop and no friction rub.   No murmur heard. Pulmonary/Chest: Effort normal and breath sounds normal. No respiratory distress. She has no wheezes.  Abdominal: Soft. Bowel sounds are normal. She exhibits no distension.       There is ttp in the right lower, mid, and upper abdomen with no rebound or guarding.    Musculoskeletal: Normal range of motion. She exhibits no edema.  Neurological: She is alert and oriented to person, place, and time.  Skin: Skin is warm and dry. She is not diaphoretic.    ED Course  Procedures (including critical care time)   Labs Reviewed  CBC WITH DIFFERENTIAL  COMPREHENSIVE METABOLIC PANEL  LIPASE, BLOOD  URINALYSIS, ROUTINE W REFLEX MICROSCOPIC   No results found.   No diagnosis found.    MDM  The patient presents here with a flareup of her chronic colitis.  The labs are all reassuring, however she is not feeling any better.  She feels as though she is unable to return home.  I have consulted [facility] for admission.  They agree to accept the patient in transfer.          Geoffery Lyons, MD 01/05/12 2123

## 2012-01-05 NOTE — ED Notes (Signed)
RLQ pain and diarrhea x 1 1/2 wks. Sent to ED by Dr. Gabriela Eves office for eval.  Long hx of colitis with multiple hospitalizations.

## 2012-01-06 ENCOUNTER — Encounter (HOSPITAL_COMMUNITY): Payer: Self-pay | Admitting: *Deleted

## 2012-01-06 ENCOUNTER — Observation Stay (HOSPITAL_COMMUNITY): Payer: 59

## 2012-01-06 DIAGNOSIS — R109 Unspecified abdominal pain: Secondary | ICD-10-CM

## 2012-01-06 DIAGNOSIS — R197 Diarrhea, unspecified: Principal | ICD-10-CM

## 2012-01-06 DIAGNOSIS — K5289 Other specified noninfective gastroenteritis and colitis: Secondary | ICD-10-CM

## 2012-01-06 DIAGNOSIS — E039 Hypothyroidism, unspecified: Secondary | ICD-10-CM

## 2012-01-06 DIAGNOSIS — E2749 Other adrenocortical insufficiency: Secondary | ICD-10-CM

## 2012-01-06 DIAGNOSIS — Z720 Tobacco use: Secondary | ICD-10-CM | POA: Diagnosis present

## 2012-01-06 LAB — COMPREHENSIVE METABOLIC PANEL
ALT: 9 U/L (ref 0–35)
AST: 13 U/L (ref 0–37)
Alkaline Phosphatase: 76 U/L (ref 39–117)
CO2: 21 mEq/L (ref 19–32)
Calcium: 8.2 mg/dL — ABNORMAL LOW (ref 8.4–10.5)
GFR calc non Af Amer: >90 mL/min (ref >90)
Potassium: 3.6 mEq/L (ref 3.5–5.1)
Sodium: 137 mEq/L (ref 135–145)
Total Protein: 5.8 g/dL — ABNORMAL LOW (ref 6.0–8.3)

## 2012-01-06 LAB — RAPID URINE DRUG SCREEN, HOSP PERFORMED
Barbiturates: NOT DETECTED
Tetrahydrocannabinol: NOT DETECTED

## 2012-01-06 LAB — CBC WITH DIFFERENTIAL/PLATELET
Basophils Relative: 1 % (ref 0–1)
Eosinophils Absolute: 0.5 10*3/uL (ref 0.0–0.7)
HCT: 35.6 % — ABNORMAL LOW (ref 36.0–46.0)
Hemoglobin: 11.6 g/dL — ABNORMAL LOW (ref 12.0–15.0)
MCH: 34.7 pg — ABNORMAL HIGH (ref 26.0–34.0)
MCHC: 32.6 g/dL (ref 30.0–36.0)
Monocytes Absolute: 0.6 10*3/uL (ref 0.1–1.0)
Monocytes Relative: 8 % (ref 3–12)
RDW: 14.7 % (ref 11.5–15.5)

## 2012-01-06 MED ORDER — ACETAMINOPHEN 325 MG PO TABS
650.0000 mg | ORAL_TABLET | Freq: Four times a day (QID) | ORAL | Status: DC | PRN
  Administered 2012-01-06 (×2): 650 mg via ORAL
  Filled 2012-01-06 (×2): qty 2

## 2012-01-06 MED ORDER — LORAZEPAM 1 MG PO TABS
1.0000 mg | ORAL_TABLET | Freq: Three times a day (TID) | ORAL | Status: DC | PRN
  Administered 2012-01-06 – 2012-01-07 (×3): 1 mg via ORAL
  Filled 2012-01-06 (×3): qty 1

## 2012-01-06 MED ORDER — NICOTINE 21 MG/24HR TD PT24
21.0000 mg | MEDICATED_PATCH | Freq: Every day | TRANSDERMAL | Status: DC
  Administered 2012-01-06 – 2012-01-07 (×2): 21 mg via TRANSDERMAL
  Filled 2012-01-06 (×2): qty 1

## 2012-01-06 MED ORDER — PNEUMOCOCCAL VAC POLYVALENT 25 MCG/0.5ML IJ INJ: 0.5000 mL | INJECTION | INTRAMUSCULAR | Status: DC

## 2012-01-06 MED ORDER — DICYCLOMINE HCL 20 MG PO TABS
20.0000 mg | ORAL_TABLET | Freq: Two times a day (BID) | ORAL | Status: DC
  Administered 2012-01-06 – 2012-01-07 (×3): 20 mg via ORAL
  Filled 2012-01-06 (×4): qty 1

## 2012-01-06 MED ORDER — PANTOPRAZOLE SODIUM 40 MG PO TBEC
40.0000 mg | DELAYED_RELEASE_TABLET | Freq: Every day | ORAL | Status: DC
  Administered 2012-01-06: 40 mg via ORAL
  Filled 2012-01-06: qty 1

## 2012-01-06 MED ORDER — COLESTIPOL HCL 1 G PO TABS
1.0000 g | ORAL_TABLET | Freq: Two times a day (BID) | ORAL | Status: DC
  Administered 2012-01-06 – 2012-01-07 (×3): 1 g via ORAL
  Filled 2012-01-06 (×4): qty 1

## 2012-01-06 MED ORDER — MORPHINE SULFATE 2 MG/ML IJ SOLN
1.0000 mg | INTRAMUSCULAR | Status: DC | PRN
  Administered 2012-01-06 (×2): 1 mg via INTRAVENOUS
  Filled 2012-01-06 (×2): qty 1

## 2012-01-06 MED ORDER — PSYLLIUM 95 % PO PACK
1.0000 | PACK | Freq: Three times a day (TID) | ORAL | Status: DC
  Administered 2012-01-06 – 2012-01-07 (×3): 1 via ORAL
  Filled 2012-01-06 (×6): qty 1

## 2012-01-06 MED ORDER — ADULT MULTIVITAMIN W/MINERALS CH
1.0000 | ORAL_TABLET | Freq: Every day | ORAL | Status: DC
  Administered 2012-01-06 – 2012-01-07 (×2): 1 via ORAL
  Filled 2012-01-06 (×2): qty 1

## 2012-01-06 MED ORDER — ALBUTEROL SULFATE HFA 108 (90 BASE) MCG/ACT IN AERS: 1.0000 | INHALATION_SPRAY | Freq: Four times a day (QID) | RESPIRATORY_TRACT | Status: DC | PRN

## 2012-01-06 MED ORDER — HYDROCORTISONE SOD SUCCINATE 100 MG IJ SOLR
50.0000 mg | Freq: Three times a day (TID) | INTRAMUSCULAR | Status: DC
  Administered 2012-01-06 – 2012-01-07 (×4): 50 mg via INTRAVENOUS
  Filled 2012-01-06 (×8): qty 1

## 2012-01-06 MED ORDER — PROMETHAZINE HCL 25 MG PO TABS: 25.0000 mg | ORAL_TABLET | Freq: Four times a day (QID) | ORAL | Status: DC | PRN

## 2012-01-06 MED ORDER — INFLUENZA VIRUS VACC SPLIT PF IM SUSP
0.5000 mL | INTRAMUSCULAR | Status: AC
  Administered 2012-01-07: 0.5 mL via INTRAMUSCULAR
  Filled 2012-01-06: qty 0.5

## 2012-01-06 MED ORDER — IOHEXOL 300 MG/ML  SOLN
80.0000 mL | Freq: Once | INTRAMUSCULAR | Status: AC | PRN
  Administered 2012-01-06: 80 mL via INTRAVENOUS

## 2012-01-06 MED ORDER — LEVOTHYROXINE SODIUM 50 MCG PO TABS
50.0000 ug | ORAL_TABLET | Freq: Every day | ORAL | Status: DC
  Administered 2012-01-06 – 2012-01-07 (×2): 50 ug via ORAL
  Filled 2012-01-06 (×3): qty 1

## 2012-01-06 MED ORDER — OXYCODONE-ACETAMINOPHEN 5-325 MG PO TABS
1.0000 | ORAL_TABLET | ORAL | Status: DC | PRN
  Administered 2012-01-06 – 2012-01-07 (×5): 2 via ORAL
  Filled 2012-01-06 (×6): qty 2

## 2012-01-06 MED ORDER — SODIUM CHLORIDE 0.9 % IV SOLN
INTRAVENOUS | Status: DC
  Administered 2012-01-06 (×2): via INTRAVENOUS

## 2012-01-06 MED ORDER — PROMETHAZINE HCL 25 MG/ML IJ SOLN
12.5000 mg | Freq: Four times a day (QID) | INTRAMUSCULAR | Status: DC | PRN
  Administered 2012-01-06 – 2012-01-07 (×5): 12.5 mg via INTRAVENOUS
  Filled 2012-01-06 (×3): qty 1
  Filled 2012-01-06: qty 2
  Filled 2012-01-06: qty 1

## 2012-01-06 MED ORDER — POTASSIUM CHLORIDE CRYS ER 20 MEQ PO TBCR
20.0000 meq | EXTENDED_RELEASE_TABLET | Freq: Every day | ORAL | Status: DC
  Administered 2012-01-06: 20 meq via ORAL
  Filled 2012-01-06 (×2): qty 1

## 2012-01-06 MED ORDER — TRAMADOL HCL 50 MG PO TABS
50.0000 mg | ORAL_TABLET | Freq: Four times a day (QID) | ORAL | Status: DC | PRN
  Administered 2012-01-06: 50 mg via ORAL
  Filled 2012-01-06 (×2): qty 1

## 2012-01-06 MED ORDER — SACCHAROMYCES BOULARDII 250 MG PO CAPS
250.0000 mg | ORAL_CAPSULE | Freq: Two times a day (BID) | ORAL | Status: DC
  Administered 2012-01-06 – 2012-01-07 (×3): 250 mg via ORAL
  Filled 2012-01-06 (×4): qty 1

## 2012-01-06 MED ORDER — NORTRIPTYLINE HCL 10 MG PO CAPS
10.0000 mg | ORAL_CAPSULE | Freq: Every day | ORAL | Status: DC
  Administered 2012-01-06: 10 mg via ORAL
  Filled 2012-01-06 (×2): qty 1

## 2012-01-06 MED ORDER — BUDESONIDE-FORMOTEROL FUMARATE 160-4.5 MCG/ACT IN AERO
2.0000 | INHALATION_SPRAY | Freq: Two times a day (BID) | RESPIRATORY_TRACT | Status: DC
  Filled 2012-01-06: qty 6

## 2012-01-06 MED ORDER — LORAZEPAM 1 MG PO TABS
1.0000 mg | ORAL_TABLET | Freq: Once | ORAL | Status: AC
  Administered 2012-01-06: 1 mg via ORAL
  Filled 2012-01-06 (×2): qty 1

## 2012-01-06 MED ORDER — ACETAMINOPHEN 650 MG RE SUPP: 650.0000 mg | Freq: Four times a day (QID) | RECTAL | Status: DC | PRN

## 2012-01-06 MED ORDER — IOHEXOL 300 MG/ML  SOLN
20.0000 mL | INTRAMUSCULAR | Status: AC
  Administered 2012-01-06 (×2): 20 mL via ORAL

## 2012-01-06 NOTE — Progress Notes (Signed)
   Patient seen earlier today by my colleague Dr. Eduard Clos, MD.  Patient seen and examined, database reviewed.  Chronic diarrhea, reported secondary to microscopic/collagenous colitis.  Acute on chronic abdominal pain.  On clear liquids, normal CT scan noted, I will advance diet as tolerated.  Clint Lipps Pager: [phone] 01/06/2012, 11:24 AM

## 2012-01-06 NOTE — H&P (Addendum)
Dawn Frost is an 46 y.o. female.   Patient was seen and examined on January 06, 2012. PCP - Dr. Sharyne Peach. Gastroenterologist - Dr. Opal Sidles in [place]. Chief Complaint: Abdominal pain and diarrhea. HPI: 46 year old female with history of collagenous colitis, COPD and ongoing tobacco use, hypothyroidism, adrenal insufficiency has been having worsening diarrhea than her usual over the last one week with abdominal pain nausea and vomiting. Since the symptoms persisted she came to the ER at [facility]. Patient was given pain relief medications and started on IV fluids. Acute abdominal series is unremarkable. Patient has been admitted for further management. Patient states her abdominal pain is mostly on the right lower quadrant. Denies any fever chills. Denies any blood in the diarrhea or vomitus.   Past Medical History  Diagnosis Date  . Collagenous colitis   . WPW (Wolff-Parkinson-White syndrome)   . Kidney stone   . Addison disease   . Thyroid disease   . Clostridium difficile diarrhea   . Anxiety and depression   . Chronic diarrhea   . Chronic back pain   . Drug-seeking behavior   . Irritable bowel syndrome (IBS)   . Anemia   . Kidney stone   . Pancreatitis     Past Surgical History  Procedure Date  . Cholecystectomy   . Tubal ligation   . Lithotripsy   . Sphincterotomy   . Cardiac electrophysiology mapping and ablation     for WPW  . Video bronchoscopy 08/31/2011    Procedure: VIDEO BRONCHOSCOPY WITH FLUORO;  Surgeon: Storm Frisk, MD;  Location: [facility];  Service: Cardiopulmonary;  Laterality: N/A;    Family History  Problem Relation Age of Onset  . Prostate cancer Father   . Coronary artery disease Maternal Grandfather    Social History:  reports that she has been smoking Cigarettes.  She has a 15 pack-year smoking history. She has never used smokeless tobacco. She reports that she does not drink alcohol or use illicit drugs.  Allergies:    Allergies  Allergen Reactions  . Ambien (Zolpidem Tartrate) Other (See Comments)    headache  . Nsaids Other (See Comments)    Bad for colitis  . Zofran Other (See Comments)    Headache     Medications Prior to Admission  Medication Sig Dispense Refill  . LORazepam (ATIVAN) 1 MG tablet Take 1 mg by mouth every 8 (eight) hours as needed.      Marland Kitchen albuterol (PROVENTIL HFA;VENTOLIN HFA) 108 (90 BASE) MCG/ACT inhaler Inhale 1 puff into the lungs every 6 (six) hours as needed. For shortness of breath.      . budesonide-formoterol (SYMBICORT) 160-4.5 MCG/ACT inhaler Inhale 2 puffs into the lungs 2 (two) times daily.  1 Inhaler  12  . colestipol (COLESTID) 1 G tablet Take 1 g by mouth 2 (two) times daily.      Marland Kitchen dicyclomine (BENTYL) 20 MG tablet Take 20 mg by mouth 2 (two) times daily.      . Hydrocodone-Acetaminophen 10-300 MG TABS Take 1 tablet by mouth Once daily as needed. For pain.      . hydrocortisone (CORTEF) 10 MG tablet Take 10 mg by mouth daily.      Marland Kitchen levothyroxine (SYNTHROID, LEVOTHROID) 50 MCG tablet Take 50 mcg by mouth daily.      . Multiple Vitamin (MULITIVITAMIN WITH MINERALS) TABS Take 1 tablet by mouth daily.       . nortriptyline (PAMELOR) 10 MG capsule Take 10 mg by mouth  daily.       . pantoprazole (PROTONIX) 40 MG tablet Take 1 tablet (40 mg total) by mouth daily.  30 tablet  1  . potassium chloride SA (K-DUR,KLOR-CON) 20 MEQ tablet Take 20 mEq by mouth daily.      . promethazine (PHENERGAN) 25 MG tablet Take 1 tablet by mouth Once daily as needed. For nausea      . promethazine (PHENERGAN) 25 MG tablet Take 1 tablet (25 mg total) by mouth every 6 (six) hours as needed for nausea.  30 tablet  0  . traMADol (ULTRAM) 50 MG tablet Take 1 tablet (50 mg total) by mouth every 6 (six) hours as needed for pain.  15 tablet  0    Results for orders placed during the hospital encounter of 01/05/12 (from the past 48 hour(s))  CBC WITH DIFFERENTIAL     Status: Abnormal    Collection Time   01/05/12  5:50 PM      Component Value Range Comment   WBC 9.6  4.0 - 10.5 K/uL    RBC 3.62 (*) 3.87 - 5.11 MIL/uL    Hemoglobin 13.1  12.0 - 15.0 g/dL    HCT 16.1  09.6 - 04.5 %    MCV 105.0 (*) 78.0 - 100.0 fL    MCH 36.2 (*) 26.0 - 34.0 pg    MCHC 34.5  30.0 - 36.0 g/dL    RDW 40.9  81.1 - 91.4 %    Platelets 238  150 - 400 K/uL    Neutrophils Relative 65  43 - 77 %    Neutro Abs 6.3  1.7 - 7.7 K/uL    Lymphocytes Relative 24  12 - 46 %    Lymphs Abs 2.3  0.7 - 4.0 K/uL    Monocytes Relative 7  3 - 12 %    Monocytes Absolute 0.6  0.1 - 1.0 K/uL    Eosinophils Relative 4  0 - 5 %    Eosinophils Absolute 0.4  0.0 - 0.7 K/uL    Basophils Relative 0  0 - 1 %    Basophils Absolute 0.0  0.0 - 0.1 K/uL   COMPREHENSIVE METABOLIC PANEL     Status: Abnormal   Collection Time   01/05/12  5:50 PM      Component Value Range Comment   Sodium 137  135 - 145 mEq/L    Potassium 3.5  3.5 - 5.1 mEq/L    Chloride 105  96 - 112 mEq/L    CO2 18 (*) 19 - 32 mEq/L    Glucose, Bld 93  70 - 99 mg/dL    BUN 14  6 - 23 mg/dL    Creatinine, Ser 7.82  0.50 - 1.10 mg/dL    Calcium 8.9  8.4 - 95.6 mg/dL    Total Protein 7.3  6.0 - 8.3 g/dL    Albumin 4.1  3.5 - 5.2 g/dL    AST 13  0 - 37 U/L    ALT 11  0 - 35 U/L    Alkaline Phosphatase 93  39 - 117 U/L    Total Bilirubin 0.3  0.3 - 1.2 mg/dL    GFR calc non Af Amer 87 (*) >90 mL/min    GFR calc Af Amer >90  >90 mL/min   LIPASE, BLOOD     Status: Normal   Collection Time   01/05/12  5:50 PM      Component Value Range Comment   Lipase  12  11 - 59 U/L   URINALYSIS, ROUTINE W REFLEX MICROSCOPIC     Status: Abnormal   Collection Time   01/05/12  7:53 PM      Component Value Range Comment   Color, Urine YELLOW  YELLOW    APPearance CLEAR  CLEAR    Specific Gravity, Urine 1.024  1.005 - 1.030    pH 5.5  5.0 - 8.0    Glucose, UA NEGATIVE  NEGATIVE mg/dL    Hgb urine dipstick LARGE (*) NEGATIVE    Bilirubin Urine NEGATIVE  NEGATIVE     Ketones, ur NEGATIVE  NEGATIVE mg/dL    Protein, ur NEGATIVE  NEGATIVE mg/dL    Urobilinogen, UA 0.2  0.0 - 1.0 mg/dL    Nitrite NEGATIVE  NEGATIVE    Leukocytes, UA NEGATIVE  NEGATIVE   URINE MICROSCOPIC-ADD ON     Status: Normal   Collection Time   01/05/12  7:53 PM      Component Value Range Comment   Squamous Epithelial / LPF RARE  RARE    RBC / HPF 3-6  <3 RBC/hpf    Bacteria, UA RARE  RARE    No results found.  Review of Systems  Constitutional: Negative.   HENT: Negative.   Eyes: Negative.   Respiratory: Negative.   Cardiovascular: Negative.   Gastrointestinal: Positive for nausea, vomiting, abdominal pain and diarrhea.  Genitourinary: Negative.   Musculoskeletal: Negative.   Skin: Negative.   Neurological: Negative.   Endo/Heme/Allergies: Negative.   Psychiatric/Behavioral: Negative.     Blood pressure 131/88, pulse 79, temperature 98.1 F (36.7 C), temperature source Oral, resp. rate 16, height 5\' 4"  (1.626 m), weight 61.9 kg (136 lb 7.4 oz), last menstrual period 10/05/2011, SpO2 98.00%. Physical Exam  Constitutional: She is oriented to person, place, and time. She appears well-developed and well-nourished. No distress.  HENT:  Head: Normocephalic and atraumatic.  Right Ear: External ear normal.  Left Ear: External ear normal.  Mouth/Throat: Oropharynx is clear and moist. No oropharyngeal exudate.  Eyes: Conjunctivae normal are normal. Right eye exhibits no discharge. Left eye exhibits no discharge. No scleral icterus.  Neck: Normal range of motion. Neck supple.  Cardiovascular: Normal rate and regular rhythm.   Respiratory: Effort normal and breath sounds normal. No respiratory distress. She has no wheezes. She has no rales.  GI: Soft. Bowel sounds are normal. She exhibits no distension. There is tenderness (tenderness in right lower quadrant.). There is no rebound and no guarding.  Musculoskeletal: Normal range of motion. She exhibits no edema and no  tenderness.  Neurological: She is alert and oriented to person, place, and time.       Moves all extremities.  Skin: Skin is warm and dry. She is not diaphoretic.     Assessment/Plan #1. Abdominal pain with diarrhea with history of collagenous colitis  - patient will be continued IV fluids and pain medications. Since patient's pain is mostly right lower quadrant we'll get a CT abdomen pelvis. Check stool for C. difficile. If symptoms does not improve may need to consult GI.  #2. COPD history of ongoing tobacco abuse - presently not wheezing. Continue inhaler. Patient advised to quit smoking. #3. Adrenal insufficiency - patient will be placed on IV hydrocortisone until patient can take by mouth hydrocortisone reliably. #4. Hypothyroidism - continue Synthroid. #5. History of possible hypersensitivity pneumonitis - chest x-ray done today shows no infiltrates. Patient is otherwise asymptomatic. #6. History of WPW syndrome status post ablation.  Pregnancy screen is pending.  CODE STATUS - full code.  Jimmie Rueter N. 01/06/2012, 2:51 AM

## 2012-01-07 DIAGNOSIS — R1110 Vomiting, unspecified: Secondary | ICD-10-CM

## 2012-01-07 MED ORDER — OXYCODONE-ACETAMINOPHEN 5-325 MG PO TABS: 1.0000 | ORAL_TABLET | ORAL | Status: DC | PRN

## 2012-01-07 NOTE — Progress Notes (Signed)
Pt has requested not to take Symbicort MDI.  States she no longer needs nor wants med.  Notified pt to discuss this with her Pulmonary caregiver (Dr Delford Field).  Info will be passed on in report as well.

## 2012-01-07 NOTE — Discharge Summary (Signed)
Physician Discharge Summary  Dawn Frost ZOX:[id number] DOB: Nov 21, 1965 DOA: 01/05/2012  PCP: Juan Quam, MD  Admit date: 01/05/2012 Discharge date: 01/07/2012  Recommendations for Outpatient Follow-up:  Followup followup with primary care physician  Discharge Diagnoses:  Principal Problem:  *Diarrhea Active Problems:  Collagenous colitis  Abdominal pain  Tobacco abuse   Discharge Condition: Stable  Diet recommendation: Regular  Filed Weights   01/05/12 1720 01/06/12 0042  Weight: 60.328 kg (133 lb) 61.9 kg (136 lb 7.4 oz)    History of present illness:  46 year old female with history of collagenous colitis, COPD and ongoing tobacco use, hypothyroidism, adrenal insufficiency has been having worsening diarrhea than her usual over the last one week with abdominal pain nausea and vomiting. Since the symptoms persisted she came to the ER at [facility]. Patient was given pain relief medications and started on IV fluids. Acute abdominal series is unremarkable. Patient has been admitted for further management. Patient states her abdominal pain is mostly on the right lower quadrant. Denies any fever chills. Denies any blood in the diarrhea or vomitus.    Hospital Course:   1. Abdominal pain: Patient has history of chronic abdominal pain, she comes every now and then will layers of acute pain, she does have history of microscopic colitis. With his abdominal pain she also had diarrhea, please note that patient reported chronic diarrhea but this is also flared up. At the time of admission the pain mostly in the right lower quadrant, CT scan of abdomen and pelvis was done and showed no acute abnormalities. C. difficile PCR tested negative. At the time of admission patient was started on clear liquids and the same day she demanded to advance her diet, diet advanced and carefully and patient mentioned that her pain is more controlled. Today she feels okay, she had some abdominal  pain, but much better than the day of admission and she wants to go home. Prescription of Percocet, 20 pills was given to patient sees her primary care physician. Patient also following her gastroenterologist Dr. Carla Drape.  2. Chronic diarrhea: Has history of microscopic colitis, stool studies are negative.  3. Adrenal insufficiency: Initially patient was placed on IV hydrocortisone, because of nausea and vomiting, until discharge her home medication of cortisone restarted.  4. Hypothyroidism: This is stable and chronic condition, all medications continued.  5. History of possible hypersensitivity pneumonitis: Chest x-ray done at the day of admission and showed no infiltrates, patient otherwise is asymptomatic.  Procedures: None Consultations:  None  Discharge Exam: Filed Vitals:   01/06/12 0633 01/06/12 1408 01/06/12 2251 01/07/12 0510  BP: 110/79 127/88 137/87 121/80  Pulse: 70 74 77 78  Temp: 97.8 F (36.6 C) 98.6 F (37 C) 98.4 F (36.9 C) 98.4 F (36.9 C)  TempSrc: Oral Oral Oral Oral  Resp: 16  18 20   Height:      Weight:      SpO2: 99% 98% 98% 99%   General: Alert and awake, oriented x3, not in any acute distress. HEENT: anicteric sclera, pupils reactive to light and accommodation, EOMI CVS: S1-S2 clear, no murmur rubs or gallops Chest: clear to auscultation bilaterally, no wheezing, rales or rhonchi Abdomen: soft nontender, nondistended, normal bowel sounds, no organomegaly Extremities: no cyanosis, clubbing or edema noted bilaterally Neuro: Cranial nerves II-XII intact, no focal neurological deficits   Discharge Instructions     Medication List     As of 01/07/2012 10:06 AM    STOP taking these medications  Hydrocodone-Acetaminophen 5-300 MG Tabs      TAKE these medications         albuterol 108 (90 BASE) MCG/ACT inhaler   Commonly known as: PROVENTIL HFA;VENTOLIN HFA   Inhale 1 puff into the lungs every 6 (six) hours as needed. For  shortness of breath.      colestipol 1 G tablet   Commonly known as: COLESTID   Take 1 g by mouth 2 (two) times daily.      dicyclomine 20 MG tablet   Commonly known as: BENTYL   Take 20 mg by mouth 2 (two) times daily.      hydrocortisone 10 MG tablet   Commonly known as: CORTEF   Take 10 mg by mouth daily.      levothyroxine 50 MCG tablet   Commonly known as: SYNTHROID, LEVOTHROID   Take 50 mcg by mouth daily.      LORazepam 1 MG tablet   Commonly known as: ATIVAN   Take 1 mg by mouth every 8 (eight) hours as needed.      multivitamin with minerals Tabs   Take 1 tablet by mouth daily.      nortriptyline 10 MG capsule   Commonly known as: PAMELOR   Take 10 mg by mouth daily.      oxyCODONE-acetaminophen 5-325 MG per tablet   Commonly known as: PERCOCET/ROXICET   Take 1 tablet by mouth every 4 (four) hours as needed for pain.      pantoprazole 40 MG tablet   Commonly known as: PROTONIX   Take 1 tablet (40 mg total) by mouth daily.      potassium chloride SA 20 MEQ tablet   Commonly known as: K-DUR,KLOR-CON   Take 20 mEq by mouth daily.      promethazine 25 MG tablet   Commonly known as: PHENERGAN   Take 1 tablet by mouth Once daily as needed. For nausea      Vitamin D (Ergocalciferol) 50000 UNITS Caps   Commonly known as: DRISDOL   Take 50,000 Units by mouth every 7 (seven) days. Take on monday           Follow-up Information    Follow up with [facility], [place]. In 1 week.      Follow up with [facility]. In 1 week.          The results of significant diagnostics from this hospitalization (including imaging, microbiology, ancillary and laboratory) are listed below for reference.    Significant Diagnostic Studies: Ct Abdomen Pelvis W Contrast  01/06/2012  *RADIOLOGY REPORT*  Clinical Data: Abdominal pain, history of colitis  CT ABDOMEN AND PELVIS WITH CONTRAST  Technique:  Multidetector CT imaging of the abdomen and pelvis was performed  following the standard protocol during bolus administration of intravenous contrast.  Contrast: 80mL OMNIPAQUE IOHEXOL 300 MG/ML  SOLN  Comparison: 08/27/2011  Findings: Mild dependent atelectasis at the bilateral lung bases.  Liver, spleen, and adrenal glands are within normal limits.  Stable mild prominence of the pancreatic duct.  No associated mass in the pancreatic head.  Status post cholecystectomy. Mild central intrahepatic/extrahepatic ductal dilatation, unchanged, likely postsurgical.  Multiple small bilateral nonobstructing calculi measuring up to 4 mm.  No hydronephrosis.  No evidence of bowel obstruction.  Normal appendix.  No definite colonic wall thickening or associated inflammatory changes.  Mild submucosal edema is possible in the region of the rectum (series 2/image 68), but there is no surrounding stranding in the mesorectal fat to suggest  an active inflammatory process.  Atherosclerotic calcifications of the abdominal aorta and branch vessels.  No abdominopelvic ascites.  Small retroperitoneal lymph nodes which do not meet pathologic CT size criteria.  Uterus and bilateral ovaries are unremarkable.  Bilateral tubal ligation clips.  Bladder is within normal limits.  Mild degenerative changes of the lumbar spine.  IMPRESSION: No CT findings to suggest active colitis.  No evidence of bowel obstruction.  Multiple bilateral nonobstructing renal calculi measuring up to 4 mm.  No hydronephrosis.   Original Report Authenticated By: Charline Bills, M.D.    Dg Abd Acute W/chest  12/27/2011  *RADIOLOGY REPORT*  Clinical Data: Diffuse abdominal pain with nausea, diarrhea and vomiting.  History of colitis.  ACUTE ABDOMEN SERIES (ABDOMEN 2 VIEW & CHEST 1 VIEW)  Comparison: Acute abdominal series 12/07/2011.  Abdominal pelvic CT 08/27/2011.  Findings: The heart size and mediastinal contours are normal. The lungs are clear. There is no pleural effusion or pneumothorax. No acute osseous findings are  identified.  Azygos fissure is noted.  There is no significant bowel distension, bowel wall thickening or free intraperitoneal air.  Cholecystectomy and bilateral tubal ligation clips are noted. There are possible small renal calculi bilaterally.  There are no suspicious osseous findings.  IMPRESSION: No acute cardiopulmonary or abdominal findings identified.   Original Report Authenticated By: Gerrianne Scale, M.D.     Microbiology: Recent Results (from the past 240 hour(s))  CLOSTRIDIUM DIFFICILE BY PCR     Status: Normal   Collection Time   01/06/12  4:12 AM      Component Value Range Status Comment   C difficile by pcr NEGATIVE  NEGATIVE Final      Labs: Basic Metabolic Panel:  Lab 01/06/12 1308 01/05/12 1750  NA 137 137  K 3.6 3.5  CL 107 105  CO2 21 18*  GLUCOSE 86 93  BUN 10 14  CREATININE 0.73 0.80  CALCIUM 8.2* 8.9  MG -- --  PHOS -- --   Liver Function Tests:  Lab 01/06/12 0432 01/05/12 1750  AST 13 13  ALT 9 11  ALKPHOS 76 93  BILITOT 0.4 0.3  PROT 5.8* 7.3  ALBUMIN 3.2* 4.1    Lab 01/05/12 1750  LIPASE 12  AMYLASE --   No results found for this basename: AMMONIA:5 in the last 168 hours CBC:  Lab 01/06/12 0432 01/05/12 1750  WBC 8.0 9.6  NEUTROABS 4.4 6.3  HGB 11.6* 13.1  HCT 35.6* 38.0  MCV 106.6* 105.0*  PLT 207 238   Cardiac Enzymes: No results found for this basename: CKTOTAL:5,CKMB:5,CKMBINDEX:5,TROPONINI:5 in the last 168 hours BNP: BNP (last 3 results) No results found for this basename: PROBNP:3 in the last 8760 hours CBG: No results found for this basename: GLUCAP:5 in the last 168 hours  Time coordinating discharge: 40 minutes  Signed:  Cillian Gwinner A  [facility] 01/07/2012, 10:06 AM

## 2012-01-07 NOTE — Progress Notes (Signed)
Dawn Frost [id number] Discharge Data: 01/07/2012 11:12 AM Attending Provider: Clydia Llano, MD GUR:[email or web address],SEGBTD, MD     Donella Stade to be D/C'd Home per MD order.  Discussed with the patient the After Visit Summary and all questions fully answered. All IV's discontinued with no bleeding noted. All belongings returned to patient for patient to take home.   Last Vital Signs:  Blood pressure 121/80, pulse 78, temperature 98.4 F (36.9 C), temperature source Oral, resp. rate 20, height 5\' 4"  (1.626 m), weight 61.9 kg (136 lb 7.4 oz), last menstrual period 10/05/2011, SpO2 99.00%.  Discharge Medication List   Medication List     As of 01/07/2012 11:12 AM    STOP taking these medications         Hydrocodone-Acetaminophen 5-300 MG Tabs      TAKE these medications         albuterol 108 (90 BASE) MCG/ACT inhaler   Commonly known as: PROVENTIL HFA;VENTOLIN HFA   Inhale 1 puff into the lungs every 6 (six) hours as needed. For shortness of breath.      colestipol 1 G tablet   Commonly known as: COLESTID   Take 1 g by mouth 2 (two) times daily.      dicyclomine 20 MG tablet   Commonly known as: BENTYL   Take 20 mg by mouth 2 (two) times daily.      hydrocortisone 10 MG tablet   Commonly known as: CORTEF   Take 10 mg by mouth daily.      levothyroxine 50 MCG tablet   Commonly known as: SYNTHROID, LEVOTHROID   Take 50 mcg by mouth daily.      LORazepam 1 MG tablet   Commonly known as: ATIVAN   Take 1 mg by mouth every 8 (eight) hours as needed.      multivitamin with minerals Tabs   Take 1 tablet by mouth daily.      nortriptyline 10 MG capsule   Commonly known as: PAMELOR   Take 10 mg by mouth daily.      oxyCODONE-acetaminophen 5-325 MG per tablet   Commonly known as: PERCOCET/ROXICET   Take 1 tablet by mouth every 4 (four) hours as needed for pain.      pantoprazole 40 MG tablet   Commonly known as: PROTONIX   Take 1 tablet (40 mg total) by mouth daily.        potassium chloride SA 20 MEQ tablet   Commonly known as: K-DUR,KLOR-CON   Take 20 mEq by mouth daily.      promethazine 25 MG tablet   Commonly known as: PHENERGAN   Take 1 tablet by mouth Once daily as needed. For nausea      Vitamin D (Ergocalciferol) 50000 UNITS Caps   Commonly known as: DRISDOL   Take 50,000 Units by mouth every 7 (seven) days. Take on monday        Rethel Sebek, Elmarie Mainland, RN   ]

## 2012-01-16 NOTE — Care Management Note (Signed)
    Page 1 of 1   01/16/2012     6:07:31 PM   CARE MANAGEMENT NOTE 01/16/2012  Patient:  Dawn Frost, Dawn Frost   Account Number:  [id number]  Date Initiated:  01/16/2012  Documentation initiated by:  Letha Cape  Subjective/Objective Assessment:   dx esophagitis     Action/Plan:   Anticipated DC Date:  01/07/2012   Anticipated DC Plan:  HOME/SELF CARE      DC Planning Services  CM consult      Choice offered to / List presented to:             Status of service:  Completed, signed off Medicare Important Message given?   (If response is "NO", the following Medicare IM given date fields will be blank) Date Medicare IM given:   Date Additional Medicare IM given:    Discharge Disposition:  HOME/SELF CARE  Per UR Regulation:  Reviewed for med. necessity/level of care/duration of stay  If discussed at Long Length of Stay Meetings, dates discussed:    Comments:

## 2012-01-30 ENCOUNTER — Inpatient Hospital Stay (HOSPITAL_COMMUNITY)
Admission: EM | Admit: 2012-01-30 | Discharge: 2012-02-02 | DRG: 392 | Disposition: A | Discharge status: Home / Self Care | Payer: 59 | Attending: Family Medicine | Admitting: Family Medicine

## 2012-01-30 ENCOUNTER — Encounter (HOSPITAL_COMMUNITY): Payer: Self-pay | Admitting: Emergency Medicine

## 2012-01-30 ENCOUNTER — Ambulatory Visit (HOSPITAL_COMMUNITY): Payer: 59

## 2012-01-30 DIAGNOSIS — Z888 Allergy status to other drugs, medicaments and biological substances status: Secondary | ICD-10-CM

## 2012-01-30 DIAGNOSIS — K589 Irritable bowel syndrome without diarrhea: Secondary | ICD-10-CM | POA: Diagnosis present

## 2012-01-30 DIAGNOSIS — K52831 Collagenous colitis: Secondary | ICD-10-CM

## 2012-01-30 DIAGNOSIS — J4489 Other specified chronic obstructive pulmonary disease: Secondary | ICD-10-CM | POA: Diagnosis present

## 2012-01-30 DIAGNOSIS — E2740 Unspecified adrenocortical insufficiency: Secondary | ICD-10-CM

## 2012-01-30 DIAGNOSIS — E039 Hypothyroidism, unspecified: Secondary | ICD-10-CM

## 2012-01-30 DIAGNOSIS — IMO0002 Reserved for concepts with insufficient information to code with codable children: Secondary | ICD-10-CM

## 2012-01-30 DIAGNOSIS — Y92009 Unspecified place in unspecified non-institutional (private) residence as the place of occurrence of the external cause: Secondary | ICD-10-CM

## 2012-01-30 DIAGNOSIS — Z720 Tobacco use: Secondary | ICD-10-CM | POA: Diagnosis present

## 2012-01-30 DIAGNOSIS — T380X5A Adverse effect of glucocorticoids and synthetic analogues, initial encounter: Secondary | ICD-10-CM | POA: Diagnosis present

## 2012-01-30 DIAGNOSIS — E2749 Other adrenocortical insufficiency: Secondary | ICD-10-CM | POA: Diagnosis present

## 2012-01-30 DIAGNOSIS — E876 Hypokalemia: Secondary | ICD-10-CM

## 2012-01-30 DIAGNOSIS — F3289 Other specified depressive episodes: Secondary | ICD-10-CM

## 2012-01-30 DIAGNOSIS — F1910 Other psychoactive substance abuse, uncomplicated: Secondary | ICD-10-CM | POA: Diagnosis present

## 2012-01-30 DIAGNOSIS — K5289 Other specified noninfective gastroenteritis and colitis: Principal | ICD-10-CM | POA: Diagnosis present

## 2012-01-30 DIAGNOSIS — M549 Dorsalgia, unspecified: Secondary | ICD-10-CM | POA: Diagnosis present

## 2012-01-30 DIAGNOSIS — G8929 Other chronic pain: Secondary | ICD-10-CM | POA: Diagnosis present

## 2012-01-30 DIAGNOSIS — Z8249 Family history of ischemic heart disease and other diseases of the circulatory system: Secondary | ICD-10-CM

## 2012-01-30 DIAGNOSIS — E860 Dehydration: Secondary | ICD-10-CM

## 2012-01-30 DIAGNOSIS — F17200 Nicotine dependence, unspecified, uncomplicated: Secondary | ICD-10-CM | POA: Diagnosis present

## 2012-01-30 DIAGNOSIS — Z9089 Acquired absence of other organs: Secondary | ICD-10-CM

## 2012-01-30 DIAGNOSIS — Z87442 Personal history of urinary calculi: Secondary | ICD-10-CM

## 2012-01-30 DIAGNOSIS — K52832 Lymphocytic colitis: Secondary | ICD-10-CM

## 2012-01-30 DIAGNOSIS — J449 Chronic obstructive pulmonary disease, unspecified: Secondary | ICD-10-CM | POA: Diagnosis present

## 2012-01-30 DIAGNOSIS — R197 Diarrhea, unspecified: Secondary | ICD-10-CM

## 2012-01-30 DIAGNOSIS — D72829 Elevated white blood cell count, unspecified: Secondary | ICD-10-CM

## 2012-01-30 DIAGNOSIS — F329 Major depressive disorder, single episode, unspecified: Secondary | ICD-10-CM

## 2012-01-30 DIAGNOSIS — F411 Generalized anxiety disorder: Secondary | ICD-10-CM | POA: Diagnosis present

## 2012-01-30 DIAGNOSIS — K529 Noninfective gastroenteritis and colitis, unspecified: Secondary | ICD-10-CM

## 2012-01-30 DIAGNOSIS — Z79899 Other long term (current) drug therapy: Secondary | ICD-10-CM

## 2012-01-30 DIAGNOSIS — Z713 Dietary counseling and surveillance: Secondary | ICD-10-CM

## 2012-01-30 DIAGNOSIS — R109 Unspecified abdominal pain: Secondary | ICD-10-CM

## 2012-01-30 LAB — URINALYSIS, MICROSCOPIC ONLY
Bilirubin Urine: NEGATIVE
Glucose, UA: NEGATIVE mg/dL
Hgb urine dipstick: NEGATIVE
Ketones, ur: NEGATIVE mg/dL
Protein, ur: NEGATIVE mg/dL
pH: 7 (ref 5.0–8.0)

## 2012-01-30 LAB — COMPREHENSIVE METABOLIC PANEL
ALT: 15 U/L (ref 0–35)
AST: 12 U/L (ref 0–37)
Albumin: 3.4 g/dL — ABNORMAL LOW (ref 3.5–5.2)
Alkaline Phosphatase: 75 U/L (ref 39–117)
CO2: 26 mEq/L (ref 19–32)
Chloride: 98 mEq/L (ref 96–112)
Creatinine, Ser: 0.73 mg/dL (ref 0.50–1.10)
GFR calc non Af Amer: >90 mL/min (ref >90)
Potassium: 3.2 mEq/L — ABNORMAL LOW (ref 3.5–5.1)
Total Bilirubin: 0.2 mg/dL — ABNORMAL LOW (ref 0.3–1.2)

## 2012-01-30 LAB — CBC WITH DIFFERENTIAL/PLATELET
Basophils Absolute: 0 10*3/uL (ref 0.0–0.1)
HCT: 37.6 % (ref 36.0–46.0)
Hemoglobin: 12.9 g/dL (ref 12.0–15.0)
Lymphocytes Relative: 28 % (ref 12–46)
Monocytes Absolute: 0.9 10*3/uL (ref 0.1–1.0)
Neutro Abs: 7.7 10*3/uL (ref 1.7–7.7)
Neutrophils Relative %: 61 % (ref 43–77)
RDW: 13.6 % (ref 11.5–15.5)
WBC: 12.6 10*3/uL — ABNORMAL HIGH (ref 4.0–10.5)

## 2012-01-30 LAB — MAGNESIUM: Magnesium: 2 mg/dL (ref 1.5–2.5)

## 2012-01-30 MED ORDER — HYDROMORPHONE HCL PF 1 MG/ML IJ SOLN
1.0000 mg | Freq: Once | INTRAMUSCULAR | Status: AC
  Administered 2012-01-30: 1 mg via INTRAVENOUS
  Filled 2012-01-30: qty 1

## 2012-01-30 MED ORDER — FAMOTIDINE 20 MG PO TABS
20.0000 mg | ORAL_TABLET | Freq: Every day | ORAL | Status: DC
  Administered 2012-01-31: 20 mg via ORAL
  Filled 2012-01-30 (×2): qty 1

## 2012-01-30 MED ORDER — LEVOTHYROXINE SODIUM 50 MCG PO TABS
50.0000 ug | ORAL_TABLET | Freq: Every day | ORAL | Status: DC
  Administered 2012-01-31: 50 ug via ORAL
  Filled 2012-01-30 (×2): qty 1

## 2012-01-30 MED ORDER — NICOTINE 14 MG/24HR TD PT24
14.0000 mg | MEDICATED_PATCH | Freq: Every day | TRANSDERMAL | Status: DC
  Administered 2012-01-30 – 2012-02-02 (×5): 14 mg via TRANSDERMAL
  Filled 2012-01-30 (×4): qty 1

## 2012-01-30 MED ORDER — PROMETHAZINE HCL 25 MG/ML IJ SOLN
25.0000 mg | Freq: Once | INTRAMUSCULAR | Status: AC
  Administered 2012-01-30: 25 mg via INTRAVENOUS
  Filled 2012-01-30: qty 1

## 2012-01-30 MED ORDER — POTASSIUM CHLORIDE CRYS ER 20 MEQ PO TBCR
20.0000 meq | EXTENDED_RELEASE_TABLET | Freq: Every day | ORAL | Status: DC
  Administered 2012-01-31: 20 meq via ORAL
  Filled 2012-01-30 (×2): qty 1

## 2012-01-30 MED ORDER — HYDROMORPHONE HCL PF 1 MG/ML IJ SOLN
1.0000 mg | INTRAMUSCULAR | Status: DC | PRN
  Administered 2012-01-30: 1 mg via INTRAVENOUS
  Filled 2012-01-30: qty 1

## 2012-01-30 MED ORDER — LORAZEPAM 1 MG PO TABS
1.0000 mg | ORAL_TABLET | Freq: Three times a day (TID) | ORAL | Status: DC | PRN
  Administered 2012-01-30: 1 mg via ORAL
  Filled 2012-01-30: qty 1

## 2012-01-30 MED ORDER — METRONIDAZOLE IN NACL 5-0.79 MG/ML-% IV SOLN
500.0000 mg | Freq: Once | INTRAVENOUS | Status: AC
  Administered 2012-01-30: 500 mg via INTRAVENOUS
  Filled 2012-01-30: qty 100

## 2012-01-30 MED ORDER — SODIUM CHLORIDE 0.9 % IV BOLUS (SEPSIS)
1000.0000 mL | Freq: Once | INTRAVENOUS | Status: AC
  Administered 2012-01-30: 1000 mL via INTRAVENOUS

## 2012-01-30 MED ORDER — NORTRIPTYLINE HCL 10 MG PO CAPS
10.0000 mg | ORAL_CAPSULE | Freq: Every day | ORAL | Status: DC
  Filled 2012-01-30 (×2): qty 1

## 2012-01-30 MED ORDER — HYDROCORTISONE 10 MG PO TABS: 10.0000 mg | ORAL_TABLET | Freq: Every day | ORAL | Status: DC

## 2012-01-30 MED ORDER — ACETAMINOPHEN 325 MG PO TABS
650.0000 mg | ORAL_TABLET | Freq: Four times a day (QID) | ORAL | Status: DC | PRN
  Administered 2012-01-31 – 2012-02-01 (×2): 650 mg via ORAL
  Filled 2012-01-30 (×2): qty 2

## 2012-01-30 MED ORDER — SODIUM CHLORIDE 0.9 % IV SOLN
INTRAVENOUS | Status: AC
  Administered 2012-01-30: 20:00:00 via INTRAVENOUS

## 2012-01-30 MED ORDER — OXYCODONE-ACETAMINOPHEN 5-325 MG PO TABS
1.0000 | ORAL_TABLET | ORAL | Status: DC | PRN
  Administered 2012-02-01 – 2012-02-02 (×3): 1 via ORAL
  Filled 2012-01-30 (×3): qty 1

## 2012-01-30 MED ORDER — CIPROFLOXACIN IN D5W 400 MG/200ML IV SOLN
400.0000 mg | Freq: Once | INTRAVENOUS | Status: AC
  Administered 2012-01-30: 400 mg via INTRAVENOUS
  Filled 2012-01-30: qty 200

## 2012-01-30 MED ORDER — SODIUM CHLORIDE 0.9 % IV SOLN
INTRAVENOUS | Status: DC
  Administered 2012-01-30 – 2012-01-31 (×2): via INTRAVENOUS

## 2012-01-30 MED ORDER — DICYCLOMINE HCL 20 MG PO TABS: 20.0000 mg | ORAL_TABLET | Freq: Two times a day (BID) | ORAL | Status: DC

## 2012-01-30 MED ORDER — SODIUM CHLORIDE 0.9 % IV SOLN
1000.0000 mL | Freq: Once | INTRAVENOUS | Status: AC
  Administered 2012-01-30: 1000 mL via INTRAVENOUS

## 2012-01-30 MED ORDER — POTASSIUM CHLORIDE CRYS ER 20 MEQ PO TBCR
40.0000 meq | EXTENDED_RELEASE_TABLET | ORAL | Status: AC
  Filled 2012-01-30: qty 2

## 2012-01-30 MED ORDER — ADULT MULTIVITAMIN W/MINERALS CH
1.0000 | ORAL_TABLET | Freq: Every day | ORAL | Status: DC
  Administered 2012-01-31: 1 via ORAL
  Filled 2012-01-30 (×2): qty 1

## 2012-01-30 MED ORDER — PROMETHAZINE HCL 25 MG PO TABS: 12.5000 mg | ORAL_TABLET | Freq: Four times a day (QID) | ORAL | Status: DC | PRN

## 2012-01-30 MED ORDER — METHYLPREDNISOLONE SODIUM SUCC 40 MG IJ SOLR
40.0000 mg | Freq: Every day | INTRAMUSCULAR | Status: DC
  Administered 2012-01-30 – 2012-02-01 (×3): 40 mg via INTRAVENOUS
  Filled 2012-01-30 (×3): qty 1

## 2012-01-30 MED ORDER — PROMETHAZINE HCL 25 MG/ML IJ SOLN
12.5000 mg | Freq: Once | INTRAMUSCULAR | Status: AC
  Administered 2012-01-30: 12.5 mg via INTRAVENOUS
  Filled 2012-01-30: qty 1

## 2012-01-30 MED ORDER — BUDESONIDE 3 MG PO CP24: 9.0000 mg | ORAL_CAPSULE | Freq: Every day | ORAL | Status: DC

## 2012-01-30 MED ORDER — IOHEXOL 300 MG/ML  SOLN
80.0000 mL | Freq: Once | INTRAMUSCULAR | Status: AC | PRN
  Administered 2012-01-30: 80 mL via INTRAVENOUS

## 2012-01-30 MED ORDER — ALUM & MAG HYDROXIDE-SIMETH 200-200-20 MG/5ML PO SUSP: 30.0000 mL | Freq: Four times a day (QID) | ORAL | Status: DC | PRN

## 2012-01-30 MED ORDER — ENOXAPARIN SODIUM 40 MG/0.4ML ~~LOC~~ SOLN
40.0000 mg | SUBCUTANEOUS | Status: DC
  Administered 2012-01-30 – 2012-02-01 (×3): 40 mg via SUBCUTANEOUS
  Filled 2012-01-30 (×4): qty 0.4

## 2012-01-30 MED ORDER — HYDROMORPHONE HCL PF 1 MG/ML IJ SOLN
1.0000 mg | INTRAMUSCULAR | Status: DC | PRN
  Administered 2012-01-30 – 2012-01-31 (×4): 1 mg via INTRAVENOUS
  Filled 2012-01-30 (×5): qty 1

## 2012-01-30 MED ORDER — ACETAMINOPHEN 650 MG RE SUPP: 650.0000 mg | Freq: Four times a day (QID) | RECTAL | Status: DC | PRN

## 2012-01-30 MED ORDER — COLESTIPOL HCL 1 G PO TABS: 1.0000 g | ORAL_TABLET | Freq: Two times a day (BID) | ORAL | Status: DC

## 2012-01-30 NOTE — ED Notes (Signed)
MD at bedside. 

## 2012-01-30 NOTE — ED Notes (Signed)
Patient requesting more pain/nausea medicine.  Verbally notified Adriana Simas MD- awaiting orders

## 2012-01-30 NOTE — ED Provider Notes (Signed)
History     CSN: [id number]  Arrival date & time 01/30/12  1135   First MD Initiated Contact with Patient 01/30/12 1158      Chief Complaint  Patient presents with  . Abdominal Pain  . Ulcerative Colitis    (Consider location/radiation/quality/duration/timing/severity/associated sxs/prior treatment) HPI right-sided abdominal pain for 2 days. Patient thinks pain is similar to her ulcerative colitis pain. Multiple bouts of diarrhea. Minimal vomiting. Nothing makes symptoms better or worse. Severity is moderate. No radiation of pain  Past Medical History  Diagnosis Date  . Collagenous colitis   . WPW (Wolff-Parkinson-White syndrome)   . Kidney stone   . Addison disease   . Thyroid disease   . Clostridium difficile diarrhea   . Anxiety and depression   . Chronic diarrhea   . Chronic back pain   . Drug-seeking behavior   . Irritable bowel syndrome (IBS)   . Anemia   . Kidney stone   . Pancreatitis     Past Surgical History  Procedure Date  . Cholecystectomy   . Tubal ligation   . Lithotripsy   . Sphincterotomy   . Cardiac electrophysiology mapping and ablation     for WPW  . Video bronchoscopy 08/31/2011    Procedure: VIDEO BRONCHOSCOPY WITH FLUORO;  Surgeon: Storm Frisk, MD;  Location: [facility];  Service: Cardiopulmonary;  Laterality: N/A;    Family History  Problem Relation Age of Onset  . Prostate cancer Father   . Coronary artery disease Maternal Grandfather     History  Substance Use Topics  . Smoking status: Current Every Day Smoker -- 0.5 packs/day for 30 years    Types: Cigarettes    Last Attempt to Quit: 09/03/2011  . Smokeless tobacco: Never Used  . Alcohol Use: No    OB History    Grav Para Term Preterm Abortions TAB SAB Ect Mult Living   3 3              Review of Systems  Allergies  Ambien; Nsaids; and Zofran  Home Medications   Current Outpatient Rx  Name Route Sig Dispense Refill  . BUDESONIDE ER 3 MG PO CP24 Oral Take 9  mg by mouth daily.    . COLESTIPOL HCL 1 G PO TABS Oral Take 1 g by mouth 2 (two) times daily.    Marland Kitchen DICYCLOMINE HCL 20 MG PO TABS Oral Take 20 mg by mouth 2 (two) times daily.    Marland Kitchen HYDROCORTISONE 10 MG PO TABS Oral Take 10 mg by mouth daily.    Marland Kitchen LEVOTHYROXINE SODIUM 50 MCG PO TABS Oral Take 50 mcg by mouth daily.    Marland Kitchen LORAZEPAM 1 MG PO TABS Oral Take 1 mg by mouth every 8 (eight) hours as needed. anxiety    . ADULT MULTIVITAMIN W/MINERALS CH Oral Take 1 tablet by mouth daily.     Marland Kitchen NORTRIPTYLINE HCL 10 MG PO CAPS Oral Take 10 mg by mouth daily.     . OXYCODONE-ACETAMINOPHEN 5-325 MG PO TABS Oral Take 1 tablet by mouth every 4 (four) hours as needed for pain. 20 tablet 0  . PANTOPRAZOLE SODIUM 40 MG PO TBEC Oral Take 1 tablet (40 mg total) by mouth daily. 30 tablet 1  . POTASSIUM CHLORIDE CRYS ER 20 MEQ PO TBCR Oral Take 20 mEq by mouth daily.    Marland Kitchen PROMETHAZINE HCL 25 MG PO TABS Oral Take 1 tablet by mouth Once daily as needed. For nausea    .  VITAMIN D (ERGOCALCIFEROL) 50000 UNITS PO CAPS Oral Take 50,000 Units by mouth every 7 (seven) days. Take on monday      BP 122/95  Pulse 123  Temp 98.1 F (36.7 C) (Oral)  Resp 20  SpO2 95%  LMP 10/05/2011  Physical Exam  Nursing note and vitals reviewed. Constitutional: She is oriented to person, place, and time. She appears well-developed and well-nourished.  HENT:  Head: Normocephalic and atraumatic.  Eyes: Conjunctivae normal and EOM are normal. Pupils are equal, round, and reactive to light.  Neck: Normal range of motion. Neck supple.  Cardiovascular: Normal rate, regular rhythm and normal heart sounds.   Pulmonary/Chest: Effort normal and breath sounds normal.  Abdominal: Soft. Bowel sounds are normal.       Minimal right sided abdominal pain  Musculoskeletal: Normal range of motion.  Neurological: She is alert and oriented to person, place, and time.  Skin: Skin is warm and dry.  Psychiatric: She has a normal mood and affect.     ED Course  Procedures (including critical care time)  Labs Reviewed  CBC WITH DIFFERENTIAL - Abnormal; Notable for the following:    WBC 12.6 (*)     RBC 3.57 (*)     MCV 105.3 (*)     MCH 36.1 (*)     All other components within normal limits  COMPREHENSIVE METABOLIC PANEL - Abnormal; Notable for the following:    Potassium 3.2 (*)     Albumin 3.4 (*)     Total Bilirubin 0.2 (*)     All other components within normal limits  URINALYSIS, MICROSCOPIC ONLY - Abnormal; Notable for the following:    APPearance TURBID (*)     Squamous Epithelial / LPF FEW (*)     All other components within normal limits  LIPASE, BLOOD   No results found.   No diagnosis found.    MDM  Hydration and pain management. CT abdomen pelvis with contrast pending. Discussed with Dr. Ranae Palms.  Suspect UC flareup or severe gastroenteritis        Donnetta Hutching, MD 01/30/12 1600

## 2012-01-30 NOTE — ED Notes (Signed)
Pt presenting to ed with c/o abdominal pain pt states onset x 2 days ago pt states it's my colitis. Pt states positive nausea, vomiting and diarrhea. Pt states she also has cramps in her calf at which usually happens when her potassium is low. Pt crying in triage

## 2012-01-30 NOTE — H&P (Signed)
[facility] History and Physical  Trace Cederberg ZOX:[id number] DOB: Nov 22, 1965 DOA: 01/30/2012  Referring physician: Dr Ranae Palms PCP: Juan Quam, MD  Specialists: GI to see tommorrow  Chief Complaint: Abdominal Pain/diarrhea  HPI: Dawn Frost is a 46 y.o. female with PMHx of collagenous colitis, COPD, hypothyroidism, adrenal Insufficiency who presents to Ed with 2-3 day history of worsening diarrhea, RLQ abdominal pain, emesis, nausea. Patient denies any meters in the stools. Patient denies any melena. Patient denies any hematochezia or any hematemesis. Patient does endorse some dizziness, cramps in the calves, and chills and weakness. Patient denies any fever, no chest pain, no shortness of breath, no syncope, no dysuria, no constipation, no cough, no other associated symptoms. Patient was seen in the ED CBC which was done did have a white count of 12,000. Basic metabolic profile which was done did have a potassium of 3.2. CT scan which was done showed an acute collagenous colitis. Will call to admit the patient for further evaluation and management as she required multiple doses of IV pain medications.  Review of Systems: The patient denies anorexia, fever, weight loss,, vision loss, decreased hearing, hoarseness, chest pain, syncope, dyspnea on exertion, peripheral edema, balance deficits, hemoptysis, abdominal pain, melena, hematochezia, severe indigestion/heartburn, hematuria, incontinence, genital sores, muscle weakness, suspicious skin lesions, transient blindness, difficulty walking, depression, unusual weight change, abnormal bleeding, enlarged lymph nodes, angioedema, and breast masses.   Past Medical History  Diagnosis Date  . Collagenous colitis   . WPW (Wolff-Parkinson-White syndrome)   . Kidney stone   . Addison disease   . Thyroid disease   . Clostridium difficile diarrhea   . Anxiety and depression   . Chronic diarrhea   . Chronic back pain   . Drug-seeking  behavior   . Irritable bowel syndrome (IBS)   . Anemia   . Kidney stone   . Pancreatitis    Past Surgical History  Procedure Date  . Cholecystectomy   . Tubal ligation   . Lithotripsy   . Sphincterotomy   . Cardiac electrophysiology mapping and ablation     for WPW  . Video bronchoscopy 08/31/2011    Procedure: VIDEO BRONCHOSCOPY WITH FLUORO;  Surgeon: Storm Frisk, MD;  Location: [facility];  Service: Cardiopulmonary;  Laterality: N/A;   Social History:  reports that she has been smoking Cigarettes.  She has a 15 pack-year smoking history. She has never used smokeless tobacco. She reports that she does not drink alcohol or use illicit drugs.  Allergies  Allergen Reactions  . Ambien (Zolpidem Tartrate) Other (See Comments)    headache  . Nsaids Other (See Comments)    Bad for colitis  . Zofran Other (See Comments)    Headache     Family History  Problem Relation Age of Onset  . Prostate cancer Father   . Coronary artery disease Maternal Grandfather     Prior to Admission medications   Medication Sig Start Date End Date Taking? Authorizing Provider  budesonide (ENTOCORT EC) 3 MG 24 hr capsule Take 9 mg by mouth daily.   Yes Historical Provider, MD  colestipol (COLESTID) 1 G tablet Take 1 g by mouth 2 (two) times daily.   Yes Historical Provider, MD  dicyclomine (BENTYL) 20 MG tablet Take 20 mg by mouth 2 (two) times daily. 10/20/11 10/19/12 Yes Renne Crigler, PA  hydrocortisone (CORTEF) 10 MG tablet Take 10 mg by mouth daily.   Yes Historical Provider, MD  levothyroxine (SYNTHROID, LEVOTHROID) 50 MCG tablet Take 50 mcg  by mouth daily.   Yes Historical Provider, MD  LORazepam (ATIVAN) 1 MG tablet Take 1 mg by mouth every 8 (eight) hours as needed. anxiety   Yes Historical Provider, MD  Multiple Vitamin (MULITIVITAMIN WITH MINERALS) TABS Take 1 tablet by mouth daily.    Yes Historical Provider, MD  nortriptyline (PAMELOR) 10 MG capsule Take 10 mg by mouth daily.  10/12/11   Yes Larena Sox, MD  oxyCODONE-acetaminophen (ROXICET) 5-325 MG per tablet Take 1 tablet by mouth every 4 (four) hours as needed for pain. 01/07/12  Yes Clydia Llano, MD  pantoprazole (PROTONIX) 40 MG tablet Take 1 tablet (40 mg total) by mouth daily. 09/23/11 09/22/12 Yes William S Minor, NP  potassium chloride SA (K-DUR,KLOR-CON) 20 MEQ tablet Take 20 mEq by mouth daily.   Yes Historical Provider, MD  promethazine (PHENERGAN) 25 MG tablet Take 1 tablet by mouth Once daily as needed. For nausea 10/25/11  Yes Historical Provider, MD  Vitamin D, Ergocalciferol, (DRISDOL) 50000 UNITS CAPS Take 50,000 Units by mouth every 7 (seven) days. Take on monday   Yes Historical Provider, MD   Physical Exam: Filed Vitals:   01/30/12 1149 01/30/12 1614  BP: 122/95 146/97  Pulse: 123 86  Temp: 98.1 F (36.7 C)   TempSrc: Oral   Resp: 20 16  SpO2: 95% 100%     General:  Well-developed well-nourished in no acute cardiopulmonary distress  Eyes: Pupils equal round and reactive to light and accommodation. Extraocular movements intact.  ENT: Oropharynx is clear, no lesions, no exudates. Dry mucous membranes.  Neck: supple with no lymphadenopathy.  Cardiovascular: tachycardic regular rhythm. No JVD. No lower extremity edema.  Respiratory: clear to auscultation bilaterally. No wheezes, no crackles, no rhonchi  Abdomen: soft, extremely tender to palpation in the right lower quadrant. Tender to palpation in the right upper quadrant. nondistended, positive bowel sounds  Skin: no rashes or lesions.  Musculoskeletal: 5 out of 5 bilateral upper extremity strength. 5 out of 5 bilateral lower extremity strength.  Psychiatric: normal mood. Normal affect. Fair insight. Fair judgment.  Neurologic: alert and oriented x3. Cranial nerves II through XII are grossly intact.  Labs on Admission:  Basic Metabolic Panel:  Lab 01/30/12 1610  NA 136  K 3.2*  CL 98  CO2 26  GLUCOSE 85  BUN 12  CREATININE 0.73   CALCIUM 8.8  MG --  PHOS --   Liver Function Tests:  Lab 01/30/12 1215  AST 12  ALT 15  ALKPHOS 75  BILITOT 0.2*  PROT 6.1  ALBUMIN 3.4*    Lab 01/30/12 1215  LIPASE 14  AMYLASE --   No results found for this basename: AMMONIA:5 in the last 168 hours CBC:  Lab 01/30/12 1215  WBC 12.6*  NEUTROABS 7.7  HGB 12.9  HCT 37.6  MCV 105.3*  PLT 255   Cardiac Enzymes: No results found for this basename: CKTOTAL:5,CKMB:5,CKMBINDEX:5,TROPONINI:5 in the last 168 hours  BNP (last 3 results) No results found for this basename: PROBNP:3 in the last 8760 hours CBG: No results found for this basename: GLUCAP:5 in the last 168 hours  Radiological Exams on Admission: Ct Abdomen Pelvis W Contrast  01/30/2012  *RADIOLOGY REPORT*  Clinical Data: Right-sided abdominal pain.  History of collagenous colitis.  CT ABDOMEN AND PELVIS WITH CONTRAST  Technique:  Multidetector CT imaging of the abdomen and pelvis was performed following the standard protocol during bolus administration of intravenous contrast.  Contrast: 80mL OMNIPAQUE IOHEXOL 300 MG/ML  SOLN  Comparison: 01/06/2012 CT abdomen and pelvis.Also 08/27/2011.  Findings: Lung bases are clear.  Normal heart size.  No focal hepatic defects.  Normal spleen, adrenal glands, and pancreas.  The gallbladder is surgically absent. Multiple nonobstructing renal calculi, greater on the left.  No evidence for hydronephrosis.  No evidence for bowel obstruction.  Slight calcification of the aortoiliac branches without aneurysmal dilatation.  There are multiple segmental areas of colonic wall thickening (most notable image 44, ascending colon, also possibly image 51 descending colon) consistent with active colitis.  No significant mesenteric inflammatory process.  No visible free fluid, free air, or localized abscess. No appendiceal inflammation.  Prominent mid transverse colon narrowing on image 28 series 2 is improved on delayed images, likely peristalsis.   Unremarkable uterus, ovaries bladder, and rectum.  No free fluid in the cul-de-sac.  Normal lumbar spine.  No visible hernia. Bilateral tubal ligation clips.  Compared with 08/27/2011 scan, the previously identified masses at the lung bases appear to have resolved.  Compared with 01/06/2012 scan, the colitis changes are more pronounced.  IMPRESSION: Multiple segmental areas of colonic wall thickening most notable in the ascending colon consistent with acute collagenous colitis.  No mesenteric inflammatory process, free fluid, abscess, or perforation. The findings represent an interval change from 01/06/2012.   Original Report Authenticated By: Elsie Stain, M.D.     EKG: None  Assessment/Plan Principal Problem:  *Collagenous colitis Active Problems:  DEPRESSION  Adrenal insufficiency  Hypothyroid  Diarrhea  Abdominal pain  Tobacco abuse  Hypokalemia  Leukocytosis  Dehydration   #1 acute collagenous colitis Patient with worsening diarrhea over the past 2-3 days then worsening right lower quadrant abdominal pain. Patient does have a slight leukocytosis however is on chronic steroid therapy secondary to her adrenal insufficiency. Will make patient n.p.o. Will hydrate with IV fluids. Will discontinue patient's Protonix. Will place on Solu-Medrol 40 mg IV daily. Supportive care. Discuss case with Dr. Arlyce Dice of gastroenterology and patient will be seen tomorrow for further evaluation and management.  #2 hypokalemia Secondary to GI losses. Check a magnesium level. Replete.  #3 diarrhea Secondary to problem #1. Will check stool studies. Will check stool for C. difficile PCR. Follow. Supportive care. IV fluids. Keep n.p.o.  #4 adrenal insufficiency Patient on IV steroids. Follow.  #5 dehydration IV fluids.  #6 leukocytosis Likely secondary to chronic steroid use. Will check a UA with cultures and sensitivities. Will check stool studies. No new, about except this time. Follow.  #7  hypothyroidism Check a TSH. Continue home dose Synthroid.  #8 tobacco abuse Tobacco cessation. We'll place on a nicotine patch.  #9 prophylaxis Pepcid for GI prophylaxis. Lovenox for DVT prophylaxis.  Code Status: Full Family Communication: Updated patient at bedside. Disposition Plan: ADMIT TO OBSERVATION  Time spent: 59 MINS  [facility] Pager [phone]  If 7PM-7AM, please contact night-coverage [email or web address] Password [facility] 01/30/2012, 6:51 PM

## 2012-01-31 DIAGNOSIS — E039 Hypothyroidism, unspecified: Secondary | ICD-10-CM

## 2012-01-31 DIAGNOSIS — F329 Major depressive disorder, single episode, unspecified: Secondary | ICD-10-CM

## 2012-01-31 DIAGNOSIS — D72829 Elevated white blood cell count, unspecified: Secondary | ICD-10-CM

## 2012-01-31 DIAGNOSIS — K5289 Other specified noninfective gastroenteritis and colitis: Principal | ICD-10-CM

## 2012-01-31 DIAGNOSIS — R197 Diarrhea, unspecified: Secondary | ICD-10-CM

## 2012-01-31 DIAGNOSIS — R109 Unspecified abdominal pain: Secondary | ICD-10-CM

## 2012-01-31 LAB — BASIC METABOLIC PANEL
CO2: 23 mEq/L (ref 19–32)
Chloride: 105 mEq/L (ref 96–112)
Glucose, Bld: 112 mg/dL — ABNORMAL HIGH (ref 70–99)
Potassium: 4.2 mEq/L (ref 3.5–5.1)
Sodium: 135 mEq/L (ref 135–145)

## 2012-01-31 LAB — CBC WITH DIFFERENTIAL/PLATELET
Basophils Absolute: 0 10*3/uL (ref 0.0–0.1)
Basophils Relative: 0 % (ref 0–1)
Eosinophils Absolute: 0.1 10*3/uL (ref 0.0–0.7)
Lymphs Abs: 0.6 10*3/uL — ABNORMAL LOW (ref 0.7–4.0)
MCH: 35.7 pg — ABNORMAL HIGH (ref 26.0–34.0)
Neutrophils Relative %: 91 % — ABNORMAL HIGH (ref 43–77)
Platelets: 212 10*3/uL (ref 150–400)
RBC: 3.36 MIL/uL — ABNORMAL LOW (ref 3.87–5.11)
WBC: 8.7 10*3/uL (ref 4.0–10.5)

## 2012-01-31 LAB — TSH: TSH: 0.304 u[IU]/mL — ABNORMAL LOW (ref 0.350–4.500)

## 2012-01-31 MED ORDER — KCL IN DEXTROSE-NACL 40-5-0.45 MEQ/L-%-% IV SOLN: INTRAVENOUS | Status: DC

## 2012-01-31 MED ORDER — HYDROMORPHONE HCL PF 2 MG/ML IJ SOLN
2.0000 mg | INTRAMUSCULAR | Status: DC | PRN
  Administered 2012-01-31 – 2012-02-01 (×8): 2 mg via INTRAVENOUS
  Filled 2012-01-31 (×8): qty 1

## 2012-01-31 MED ORDER — LEVOTHYROXINE SODIUM 100 MCG IV SOLR
25.0000 ug | Freq: Every day | INTRAVENOUS | Status: DC
  Administered 2012-02-01: 26 ug via INTRAVENOUS
  Filled 2012-01-31: qty 1.3

## 2012-01-31 MED ORDER — DIPHENHYDRAMINE HCL 50 MG/ML IJ SOLN
12.5000 mg | Freq: Once | INTRAMUSCULAR | Status: AC
  Administered 2012-01-31: 12.5 mg via INTRAVENOUS
  Filled 2012-01-31: qty 1

## 2012-01-31 MED ORDER — PROMETHAZINE HCL 25 MG/ML IJ SOLN
12.5000 mg | Freq: Four times a day (QID) | INTRAMUSCULAR | Status: DC | PRN
  Administered 2012-01-31 – 2012-02-02 (×6): 12.5 mg via INTRAVENOUS
  Filled 2012-01-31 (×7): qty 1

## 2012-01-31 MED ORDER — LORAZEPAM 2 MG/ML IJ SOLN
1.0000 mg | Freq: Three times a day (TID) | INTRAMUSCULAR | Status: DC | PRN
  Administered 2012-01-31 – 2012-02-01 (×3): 1 mg via INTRAVENOUS
  Filled 2012-01-31 (×4): qty 1

## 2012-01-31 MED ORDER — SODIUM CHLORIDE 0.9 % IV SOLN
40.0000 mg | INTRAVENOUS | Status: DC
  Administered 2012-01-31 – 2012-02-01 (×2): 40 mg via INTRAVENOUS
  Filled 2012-01-31 (×2): qty 4

## 2012-01-31 MED ORDER — KCL IN DEXTROSE-NACL 40-5-0.45 MEQ/L-%-% IV SOLN
INTRAVENOUS | Status: DC
  Administered 2012-01-31 – 2012-02-01 (×2): via INTRAVENOUS
  Filled 2012-01-31 (×3): qty 1000

## 2012-01-31 NOTE — Progress Notes (Signed)
Pt reports having increased nausea and unable to hold any pills down, on call provider notified, new orders given for a one time dose of IV phenergan 12.5 mg.  Pt states she will try and take her night time meds after have the IV phenergan.  RN checked back with pt after given nausea medication and pt states she does not think she can take her night time meds without throwing them back up.  On call provider notified, no new orders given.

## 2012-01-31 NOTE — Progress Notes (Signed)
[facility] PROGRESS NOTE  Dawn Frost ZOX:[id number] DOB: 08-17-1965 DOA: 01/30/2012 PCP: Juan Quam, MD  Assessment/Plan: #1 acute collagenous colitis  Patient with worsening diarrhea over the past 2-3 days prior to admission; and multiple episodes overnight inside the hospital. Still complaining of right lower quadrant abdominal pain. No fever and WBC's WNL now. -Will continue NPO status -Continue IVF's and medications through her veins -Will continue pepcid -Continue IV steroids -Will follow GI recommendations. (Dr. Arlyce Dice)    #2 hypokalemia  -Secondary to GI losses.  -magnesium level WNL today. -BMET    #3 diarrhea  Secondary to problem #1.  -C. Diff negative.  -Continue Supportive care. IV fluids. Keep n.p.o.  -Will follow GI recommendations.  #4 adrenal insufficiency  -Stable. Patient on IV steroids. Will monitor and resume po steroids when able.   #5 dehydration  -Continue IVF's  #6 leukocytosis  Likely secondary to chronic steroid use and stress demargination.  -Will follow UA with cx. -WBC's back to WNL after IVF's given  #7 hypothyroidism  Will follow TSH results and adjust synthroid as needed.    #8 tobacco abuse  Tobacco cessation counseling provided. Will continue nicotine patch.   #9 prophylaxis  Pepcid for GI prophylaxis.   Lovenox for DVT prophylaxis.     Code Status: Full Family Communication: no family at bedside Disposition Plan: Home when medically stable. At this point medications change to IV since she is no tolerating PO well due to GI symptoms.   Consultants:  GI  Procedures:  See below  Antibiotics:  Received cipro and flagyl X 1 dose in ED; but no signs of infection and hx of collagenous colitis. abx's discontinued.  HPI/Subjective: Complaining of significant nausea and abdominal pain, still; afebrile and with negative C. Diff. No CP or SOB.  Objective: Filed Vitals:   01/30/12 1149 01/30/12 1614  01/30/12 2025 01/31/12 0535  BP: 122/95 146/97 143/92 149/98  Pulse: 123 86 80 81  Temp: 98.1 F (36.7 C)  98.2 F (36.8 C) 98.9 F (37.2 C)  TempSrc: Oral  Oral Oral  Resp: 20 16 18 18   Height:   5\' 4"  (1.626 m)   Weight:   61.9 kg (136 lb 7.4 oz) 65.3 kg (143 lb 15.4 oz)  SpO2: 95% 100% 100% 100%    Intake/Output Summary (Last 24 hours) at 01/31/12 1137 Last data filed at 01/31/12 8119  Gross per 24 hour  Intake 3010.42 ml  Output      0 ml  Net 3010.42 ml   Filed Weights   01/30/12 2025 01/31/12 0535  Weight: 61.9 kg (136 lb 7.4 oz) 65.3 kg (143 lb 15.4 oz)    Exam:   General:  NAD, afebrile; still with abdominal discomfort and nausea  Cardiovascular: S1 and S2; no rubs or gallops  Respiratory: CTA bilaterally  Abdomen: soft, ND, tender to palpation diffusely; positive BS  Extremities; mild Right hand edema from infiltrated IV, otherwise no swelling or any other abnormalities seen.  Neuro: non focal deficit.  Data Reviewed: Basic Metabolic Panel:  Lab 01/31/12 1478 01/30/12 1215  NA 135 136  K 4.2 3.2*  CL 105 98  CO2 23 26  GLUCOSE 112* 85  BUN 6 12  CREATININE 0.65 0.73  CALCIUM 7.6* 8.8  MG 1.9 2.0  PHOS -- --   Liver Function Tests:  Lab 01/30/12 1215  AST 12  ALT 15  ALKPHOS 75  BILITOT 0.2*  PROT 6.1  ALBUMIN 3.4*    Lab 01/30/12 1215  LIPASE 14  AMYLASE --   CBC:  Lab 01/31/12 0420 01/30/12 1215  WBC 8.7 12.6*  NEUTROABS 7.9* 7.7  HGB 12.0 12.9  HCT 36.1 37.6  MCV 107.4* 105.3*  PLT 212 255    Recent Results (from the past 240 hour(s))  CLOSTRIDIUM DIFFICILE BY PCR     Status: Normal   Collection Time   01/30/12  8:03 PM      Component Value Range Status Comment   C difficile by pcr NEGATIVE  NEGATIVE Final      Studies: Ct Abdomen Pelvis W Contrast  01/30/2012  *RADIOLOGY REPORT*  Clinical Data: Right-sided abdominal pain.  History of collagenous colitis.  CT ABDOMEN AND PELVIS WITH CONTRAST  Technique:   Multidetector CT imaging of the abdomen and pelvis was performed following the standard protocol during bolus administration of intravenous contrast.  Contrast: 80mL OMNIPAQUE IOHEXOL 300 MG/ML  SOLN  Comparison: 01/06/2012 CT abdomen and pelvis.Also 08/27/2011.  Findings: Lung bases are clear.  Normal heart size.  No focal hepatic defects.  Normal spleen, adrenal glands, and pancreas.  The gallbladder is surgically absent. Multiple nonobstructing renal calculi, greater on the left.  No evidence for hydronephrosis.  No evidence for bowel obstruction.  Slight calcification of the aortoiliac branches without aneurysmal dilatation.  There are multiple segmental areas of colonic wall thickening (most notable image 44, ascending colon, also possibly image 51 descending colon) consistent with active colitis.  No significant mesenteric inflammatory process.  No visible free fluid, free air, or localized abscess. No appendiceal inflammation.  Prominent mid transverse colon narrowing on image 28 series 2 is improved on delayed images, likely peristalsis.  Unremarkable uterus, ovaries bladder, and rectum.  No free fluid in the cul-de-sac.  Normal lumbar spine.  No visible hernia. Bilateral tubal ligation clips.  Compared with 08/27/2011 scan, the previously identified masses at the lung bases appear to have resolved.  Compared with 01/06/2012 scan, the colitis changes are more pronounced.  IMPRESSION: Multiple segmental areas of colonic wall thickening most notable in the ascending colon consistent with acute collagenous colitis.  No mesenteric inflammatory process, free fluid, abscess, or perforation. The findings represent an interval change from 01/06/2012.   Original Report Authenticated By: Elsie Stain, M.D.     Scheduled Meds:   . sodium chloride  1,000 mL Intravenous Once  . sodium chloride   Intravenous STAT  . ciprofloxacin  400 mg Intravenous Once  . diphenhydrAMINE  12.5 mg Intravenous Once  .  enoxaparin (LOVENOX) injection  40 mg Subcutaneous Q24H  . famotidine (PEPCID) IV  40 mg Intravenous Q24H  .  HYDROmorphone (DILAUDID) injection  1 mg Intravenous Once  .  HYDROmorphone (DILAUDID) injection  1 mg Intravenous Once  . levothyroxine  26 mcg Intravenous Daily  . methylPREDNISolone (SOLU-MEDROL) injection  40 mg Intravenous Daily  . metronidazole  500 mg Intravenous Once  . nicotine  14 mg Transdermal Daily  . potassium chloride  40 mEq Oral Q4H  . promethazine  12.5 mg Intravenous Once  . promethazine  25 mg Intravenous Once  . promethazine  25 mg Intravenous Once  . sodium chloride  1,000 mL Intravenous Once  . sodium chloride  1,000 mL Intravenous Once  . DISCONTD: budesonide  9 mg Oral Daily  . DISCONTD: colestipol  1 g Oral BID  . DISCONTD: dicyclomine  20 mg Oral BID  . DISCONTD: famotidine  20 mg Oral Daily  . DISCONTD: hydrocortisone  10 mg Oral Daily  .  DISCONTD: levothyroxine  50 mcg Oral Daily  . DISCONTD: multivitamin with minerals  1 tablet Oral Daily  . DISCONTD: nortriptyline  10 mg Oral Daily  . DISCONTD: potassium chloride SA  20 mEq Oral Daily   Continuous Infusions:   . dextrose 5 % and 0.45 % NaCl with KCl 40 mEq/L    . DISCONTD: sodium chloride 125 mL/hr at 01/31/12 0404    Time spent: >30 minutes   Omar Orrego  [facility] Pager [phone]. If 8PM-8AM, please contact night-coverage at [email or web address], password [facility] 01/31/2012, 11:37 AM  LOS: 1 day

## 2012-01-31 NOTE — Care Management Note (Signed)
    Page 1 of 1   01/31/2012     12:36:54 PM   CARE MANAGEMENT NOTE 01/31/2012  Patient:  Dawn Frost, Dawn Frost   Account Number:  [id number]  Date Initiated:  01/31/2012  Documentation initiated by:  Lorenda Ishihara  Subjective/Objective Assessment:   46 yo female admitted with diarrhea, likely related to colitis. PTA lived at home with spouse.     Action/Plan:   home when stable.   Anticipated DC Date:  02/02/2012   Anticipated DC Plan:  HOME/SELF CARE      DC Planning Services  CM consult      Choice offered to / List presented to:             Status of service:  Completed, signed off Medicare Important Message given?   (If response is "NO", the following Medicare IM given date fields will be blank) Date Medicare IM given:   Date Additional Medicare IM given:    Discharge Disposition:  HOME/SELF CARE  Per UR Regulation:  Reviewed for med. necessity/level of care/duration of stay  If discussed at Long Length of Stay Meetings, dates discussed:    Comments:

## 2012-01-31 NOTE — Consult Note (Signed)
Referring Provider: No ref. provider found Primary Care Physician:  Juan Quam, MD Primary Gastroenterologist:  Dr. Opal Sidles (has seen Dr. Melvia Heaps too)  Reason for Consultation:  Microscopic colitis, diarrhea  HPI: Dawn Frost is a 46 y.o. 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 Opal Sidles in [place].  Since that time she has experienced severe diarrhea 2-10 times on any given day.  She is afraid to go anywhere because of the diarrhea.  If she knows that she has to go somewhere then she just will not eat because she has to go to the bathroom right away.  Says that everything comes out the same way that it goes in.  Complains of abdominal pain, mostly on the right side of the abdomen.  Nausea and vomiting during flares as well.  Says that she is chronically on potassium at home because it is always low due to her GI losses with the diarrhea.  No dark or bloody stools.  Came to the hospital on this occasion because of worsening symptoms.  She had CT scan in the ED, which showed wall thickening of multiple segments of ascending colon (which should not be seen with microscopic colitis).  She had a leukocytosis on admission of 12,000, which has resolved now.    Had recent colonoscopy by Dr. Opal Sidles and they are faxing the records for that.  She has failed several regimens including entocort 9mg  daily, bentyl, colestid, questran, probiotics.  She says that she was on prednisone in the past, which is the only thing that seemed to help but she eventually developed side effects from that and it was discontinued.  Says that lotronex was mentioned to her and she was willing to try it, but was not covered by her insurance.  Has been negative for celiac and lactose intolerance.  Is on IV cipro and flagyl here as well as 40mg  daily of methylpred IV.  Says that the antiemetics are helping the nausea, but does not feel the diarrhea has improved yet at all.   Cdiff is negative and stool culture and O&P are pending.   Past Medical History  Diagnosis Date  . Collagenous colitis   . WPW (Wolff-Parkinson-White syndrome)   . Kidney stone   . Addison disease   . Thyroid disease   . Clostridium difficile diarrhea   . Anxiety and depression   . Chronic back pain   . Drug-seeking behavior   . Irritable bowel syndrome (IBS)   . Anemia   . Pancreatitis     Past Surgical History  Procedure Date  . Cholecystectomy   . Tubal ligation   . Lithotripsy   . Sphincterotomy   . Cardiac electrophysiology mapping and ablation     for WPW  . Video bronchoscopy 08/31/2011    Procedure: VIDEO BRONCHOSCOPY WITH FLUORO;  Surgeon: Storm Frisk, MD;  Location: [facility];  Service: Cardiopulmonary;  Laterality: N/A;    Prior to Admission medications   Medication Sig Start Date End Date Taking? Authorizing Provider  budesonide (ENTOCORT EC) 3 MG 24 hr capsule Take 9 mg by mouth daily.   Yes Historical Provider, MD  colestipol (COLESTID) 1 G tablet Take 1 g by mouth 2 (two) times daily.   Yes Historical Provider, MD  dicyclomine (BENTYL) 20 MG tablet Take 20 mg by mouth 2 (two) times daily. 10/20/11 10/19/12 Yes Renne Crigler, PA  hydrocortisone (CORTEF) 10 MG tablet Take 10 mg by mouth daily.  Yes Historical Provider, MD  levothyroxine (SYNTHROID, LEVOTHROID) 50 MCG tablet Take 50 mcg by mouth daily.   Yes Historical Provider, MD  LORazepam (ATIVAN) 1 MG tablet Take 1 mg by mouth every 8 (eight) hours as needed. anxiety   Yes Historical Provider, MD  Multiple Vitamin (MULITIVITAMIN WITH MINERALS) TABS Take 1 tablet by mouth daily.    Yes Historical Provider, MD  nortriptyline (PAMELOR) 10 MG capsule Take 10 mg by mouth daily.  10/12/11  Yes Larena Sox, MD  oxyCODONE-acetaminophen (ROXICET) 5-325 MG per tablet Take 1 tablet by mouth every 4 (four) hours as needed for pain. 01/07/12  Yes Clydia Llano, MD  pantoprazole (PROTONIX) 40 MG tablet Take 1  tablet (40 mg total) by mouth daily. 09/23/11 09/22/12 Yes William S Minor, NP  potassium chloride SA (K-DUR,KLOR-CON) 20 MEQ tablet Take 20 mEq by mouth daily.   Yes Historical Provider, MD  promethazine (PHENERGAN) 25 MG tablet Take 1 tablet by mouth Once daily as needed. For nausea 10/25/11  Yes Historical Provider, MD  Vitamin D, Ergocalciferol, (DRISDOL) 50000 UNITS CAPS Take 50,000 Units by mouth every 7 (seven) days. Take on monday   Yes Historical Provider, MD    Current Facility-Administered Medications  Medication Dose Route Frequency Provider Last Rate Last Dose  . 0.9 %  sodium chloride infusion   Intravenous STAT Loren Racer, MD 75 mL/hr at 01/30/12 1952    . acetaminophen (TYLENOL) tablet 650 mg  650 mg Oral Q6H PRN Rodolph Bong, MD   650 mg at 01/31/12 8119   Or  . acetaminophen (TYLENOL) suppository 650 mg  650 mg Rectal Q6H PRN Rodolph Bong, MD      . alum & mag hydroxide-simeth (MAALOX/MYLANTA) 200-200-20 MG/5ML suspension 30 mL  30 mL Oral Q6H PRN Rodolph Bong, MD      . ciprofloxacin (CIPRO) IVPB 400 mg  400 mg Intravenous Once Loren Racer, MD 200 mL/hr at 01/30/12 1811 400 mg at 01/30/12 1811  . dextrose 5 % and 0.45 % NaCl with KCl 40 mEq/L infusion   Intravenous Continuous Vassie Loll, MD 75 mL/hr at 01/31/12 1234    . diphenhydrAMINE (BENADRYL) injection 12.5 mg  12.5 mg Intravenous Once Army Chaco, NP   12.5 mg at 01/31/12 0040  . enoxaparin (LOVENOX) injection 40 mg  40 mg Subcutaneous Q24H Rodolph Bong, MD   40 mg at 01/30/12 2221  . famotidine (PEPCID) 40 mg in sodium chloride 0.9 % 50 mL IVPB  40 mg Intravenous Q24H Vassie Loll, MD   40 mg at 01/31/12 1237  . HYDROmorphone (DILAUDID) injection 1 mg  1 mg Intravenous Once Donnetta Hutching, MD   1 mg at 01/30/12 1513  . HYDROmorphone (DILAUDID) injection 2 mg  2 mg Intravenous Q3H PRN Vassie Loll, MD   2 mg at 01/31/12 1200  . iohexol (OMNIPAQUE) 300 MG/ML solution 80 mL  80 mL  Intravenous Once PRN Medication Radiologist, MD   80 mL at 01/30/12 1642  . levothyroxine (SYNTHROID, LEVOTHROID) injection 26 mcg  26 mcg Intravenous Daily Vassie Loll, MD      . LORazepam (ATIVAN) injection 1 mg  1 mg Intravenous Q8H PRN Vassie Loll, MD      . methylPREDNISolone sodium succinate (SOLU-MEDROL) 40 mg/mL injection 40 mg  40 mg Intravenous Daily Rodolph Bong, MD   40 mg at 01/31/12 1003  . metroNIDAZOLE (FLAGYL) IVPB 500 mg  500 mg Intravenous Once Loren Racer, MD  500 mg at 01/30/12 1811  . nicotine (NICODERM CQ - dosed in mg/24 hours) patch 14 mg  14 mg Transdermal Daily Rodolph Bong, MD   14 mg at 01/31/12 1003  . oxyCODONE-acetaminophen (PERCOCET/ROXICET) 5-325 MG per tablet 1 tablet  1 tablet Oral Q4H PRN Rodolph Bong, MD      . potassium chloride SA (K-DUR,KLOR-CON) CR tablet 40 mEq  40 mEq Oral Q4H Rodolph Bong, MD      . promethazine [facility]) injection 12.5 mg  12.5 mg Intravenous Once Army Chaco, NP   12.5 mg at 01/30/12 2216  . promethazine (PHENERGAN) injection 12.5 mg  12.5 mg Intravenous Q6H PRN Vassie Loll, MD   12.5 mg at 01/31/12 1343  . promethazine (PHENERGAN) injection 25 mg  25 mg Intravenous Once Donnetta Hutching, MD   25 mg at 01/30/12 1514  . sodium chloride 0.9 % bolus 1,000 mL  1,000 mL Intravenous Once Donnetta Hutching, MD   1,000 mL at 01/30/12 1812  . sodium chloride 0.9 % bolus 1,000 mL  1,000 mL Intravenous Once Donnetta Hutching, MD   1,000 mL at 01/30/12 1313  . DISCONTD: 0.9 %  sodium chloride infusion   Intravenous Continuous Rodolph Bong, MD 125 mL/hr at 01/31/12 0404    . DISCONTD: budesonide (ENTOCORT EC) 24 hr capsule 9 mg  9 mg Oral Daily Rodolph Bong, MD      . DISCONTD: colestipol (COLESTID) tablet 1 g  1 g Oral BID Rodolph Bong, MD      . DISCONTD: dextrose 5 % and 0.45 % NaCl with KCl 40 mEq/L infusion   Intravenous Continuous Vassie Loll, MD      . DISCONTD: dicyclomine (BENTYL) tablet 20 mg  20 mg  Oral BID Rodolph Bong, MD      . DISCONTD: famotidine (PEPCID) tablet 20 mg  20 mg Oral Daily Rodolph Bong, MD   20 mg at 01/31/12 1003  . DISCONTD: hydrocortisone (CORTEF) tablet 10 mg  10 mg Oral Daily Rodolph Bong, MD      . DISCONTD: HYDROmorphone (DILAUDID) injection 1 mg  1 mg Intravenous Q4H PRN Loren Racer, MD   1 mg at 01/30/12 2002  . DISCONTD: HYDROmorphone (DILAUDID) injection 1 mg  1 mg Intravenous Q3H PRN Army Chaco, NP   1 mg at 01/31/12 1003  . DISCONTD: levothyroxine (SYNTHROID, LEVOTHROID) tablet 50 mcg  50 mcg Oral Daily Rodolph Bong, MD   50 mcg at 01/31/12 1003  . DISCONTD: LORazepam (ATIVAN) tablet 1 mg  1 mg Oral Q8H PRN Rodolph Bong, MD   1 mg at 01/30/12 2051  . DISCONTD: multivitamin with minerals tablet 1 tablet  1 tablet Oral Daily Rodolph Bong, MD   1 tablet at 01/31/12 1003  . DISCONTD: nortriptyline (PAMELOR) capsule 10 mg  10 mg Oral Daily Rodolph Bong, MD      . DISCONTD: potassium chloride SA (K-DUR,KLOR-CON) CR tablet 20 mEq  20 mEq Oral Daily Rodolph Bong, MD   20 mEq at 01/31/12 1003  . DISCONTD: promethazine (PHENERGAN) tablet 12.5 mg  12.5 mg Oral Q6H PRN Rodolph Bong, MD        Allergies as of 01/30/2012 - Review Complete 01/30/2012  Allergen Reaction Noted  . Ambien (zolpidem tartrate) Other (See Comments) 08/07/2011  . Nsaids Other (See Comments) 05/16/2011  . Zofran Other (See Comments) 01/23/2011    Family History  Problem Relation Age  of Onset  . Prostate cancer Father   . Coronary artery disease Maternal Grandfather     History   Social History  . Marital Status: Married    Spouse Name: N/A    Number of Children: 3  . Years of Education: N/A   Occupational History  . Inventory in a warehouse    Social History Main Topics  . Smoking status: Current Every Day Smoker -- 0.5 packs/day for 30 years    Types: Cigarettes  . Smokeless tobacco: Never Used  . Alcohol Use: No  . Drug  Use: No  . Sexually Active: Yes -- Female partner(s)    Birth Control/ Protection: Surgical   Other Topics Concern  . Not on file   Social History Narrative  . No narrative on file    Review of Systems: Ten point ROS is otherwise negative except as mentioned in HPI.  Physical Exam: Vital signs in last 24 hours: Temp:  [98.2 F (36.8 C)-98.9 F (37.2 C)] 98.9 F (37.2 C) (10/29 0535) Pulse Rate:  [80-86] 81  (10/29 0535) Resp:  [16-18] 18  (10/29 0535) BP: (143-149)/(92-98) 149/98 mmHg (10/29 0535) SpO2:  [100 %] 100 % (10/29 0535) Weight:  [136 lb 7.4 oz (61.9 kg)-143 lb 15.4 oz (65.3 kg)] 143 lb 15.4 oz (65.3 kg) (10/29 0535) Last BM Date: 01/30/12 General:   Alert, Well-developed, well-nourished, pleasant and cooperative in NAD.  Tearful. Head:  Normocephalic and atraumatic. Eyes:  Sclera clear, no icterus.  Conjunctiva pink. Ears:  Normal auditory acuity. Mouth:  No deformity or lesions.   Lungs:  Clear throughout to auscultation.  No wheezes, crackles, or rhonchi.  Heart:  Regular rate and rhythm; no murmurs, clicks, rubs,  or gallops. Abdomen:  Soft, non-distended, BS active, mild diffuse TTP > RLQ without R/R/G. Rectal:  Deferred.  Msk:  Symmetrical without gross deformities. Pulses:  Normal pulses noted. Extremities:  Without clubbing or edema. Neurologic:  Alert and  oriented x4;  grossly normal neurologically. Skin:  Intact without significant lesions or rashes. Psych:  Alert and cooperative. Normal mood and affect.  Intake/Output from previous day: 10/28 0701 - 10/29 0700 In: 3010.4 [I.V.:3010.4] Out: -   Lab Results:  Basename 01/31/12 0420 01/30/12 1215  WBC 8.7 12.6*  HGB 12.0 12.9  HCT 36.1 37.6  PLT 212 255   BMET  Basename 01/31/12 0420 01/30/12 1215  NA 135 136  K 4.2 3.2*  CL 105 98  CO2 23 26  GLUCOSE 112* 85  BUN 6 12  CREATININE 0.65 0.73  CALCIUM 7.6* 8.8   LFT  Basename 01/30/12 1215  PROT 6.1  ALBUMIN 3.4*  AST 12  ALT 15    ALKPHOS 75  BILITOT 0.2*  BILIDIR --  IBILI --    Studies/Results: Ct Abdomen Pelvis W Contrast  01/30/2012  *RADIOLOGY REPORT*  Clinical Data: Right-sided abdominal pain.  History of collagenous colitis.  CT ABDOMEN AND PELVIS WITH CONTRAST  Technique:  Multidetector CT imaging of the abdomen and pelvis was performed following the standard protocol during bolus administration of intravenous contrast.  Contrast: 80mL OMNIPAQUE IOHEXOL 300 MG/ML  SOLN  Comparison: 01/06/2012 CT abdomen and pelvis.Also 08/27/2011.  Findings: Lung bases are clear.  Normal heart size.  No focal hepatic defects.  Normal spleen, adrenal glands, and pancreas.  The gallbladder is surgically absent. Multiple nonobstructing renal calculi, greater on the left.  No evidence for hydronephrosis.  No evidence for bowel obstruction.  Slight calcification of the aortoiliac  branches without aneurysmal dilatation.  There are multiple segmental areas of colonic wall thickening (most notable image 44, ascending colon, also possibly image 51 descending colon) consistent with active colitis.  No significant mesenteric inflammatory process.  No visible free fluid, free air, or localized abscess. No appendiceal inflammation.  Prominent mid transverse colon narrowing on image 28 series 2 is improved on delayed images, likely peristalsis.  Unremarkable uterus, ovaries bladder, and rectum.  No free fluid in the cul-de-sac.  Normal lumbar spine.  No visible hernia. Bilateral tubal ligation clips.  Compared with 08/27/2011 scan, the previously identified masses at the lung bases appear to have resolved.  Compared with 01/06/2012 scan, the colitis changes are more pronounced.  IMPRESSION: Multiple segmental areas of colonic wall thickening most notable in the ascending colon consistent with acute collagenous colitis.  No mesenteric inflammatory process, free fluid, abscess, or perforation. The findings represent an interval change from 01/06/2012.    Original Report Authenticated By: Elsie Stain, M.D.     IMPRESSION:  -Microscopic colitis possibly with some overlapping IBS-D and/or bile-salt diarrhea.  ? IBD with findings on CT scan (typically do not see wall thickening, etc on imaging studies with microscopic colitis).  Uncontrolled and has failed several regimens. -Nausea and vomiting. -Hypokalemia  PLAN: -Continue IV abx, IV steroids, and anti-emetics for now. -Await records from Dr. Gabriela Eves office. -Can have clear liquids.   ZEHR, JESSICA D.  01/31/2012, 2:19 PM  Pager number [phone]    I have taken a history, examined the patient and reviewed the chart. I agree with Otho Darner note, impression and recommendations. Severe, recurrent diarrhea previously diagnosed as microscopic colitis but not responsive to standard therapy. Biopsies from 2012 Flex Sig by Dr. Arlyce Dice showed microscopic colitis with atypical features of neutrophils which is concerning for other types of colitis, including IBD. Possible bile salt diarrhea or IBS-D overlapping her colitis. She has been evaluated at [facility] for her colitis. We are awaiting records from Dr. Gabriela Eves office: (her most recent colonoscopy, biopsies, CT, office records, etc). Continue IV steroids and IV antibiotics for now. Probably will need evaluation at tertiary medical center-possible return to [facility] or new appt at [facility].  Meryl Dare MD [facility]

## 2012-02-01 DIAGNOSIS — E2749 Other adrenocortical insufficiency: Secondary | ICD-10-CM

## 2012-02-01 LAB — URINALYSIS, ROUTINE W REFLEX MICROSCOPIC
Ketones, ur: NEGATIVE mg/dL
Leukocytes, UA: NEGATIVE
Nitrite: NEGATIVE
Protein, ur: NEGATIVE mg/dL
Urobilinogen, UA: 0.2 mg/dL (ref 0.0–1.0)
pH: 6 (ref 5.0–8.0)

## 2012-02-01 LAB — BASIC METABOLIC PANEL
BUN: 6 mg/dL (ref 6–23)
Chloride: 103 mEq/L (ref 96–112)
Glucose, Bld: 100 mg/dL — ABNORMAL HIGH (ref 70–99)
Potassium: 4 mEq/L (ref 3.5–5.1)
Sodium: 136 mEq/L (ref 135–145)

## 2012-02-01 LAB — OVA AND PARASITE EXAMINATION

## 2012-02-01 MED ORDER — DIPHENHYDRAMINE HCL 25 MG PO CAPS
25.0000 mg | ORAL_CAPSULE | Freq: Once | ORAL | Status: AC | PRN
  Administered 2012-02-01: 25 mg via ORAL
  Filled 2012-02-01: qty 1

## 2012-02-01 MED ORDER — BUDESONIDE 3 MG PO CP24
9.0000 mg | ORAL_CAPSULE | Freq: Every day | ORAL | Status: DC
  Administered 2012-02-02: 9 mg via ORAL
  Filled 2012-02-01: qty 3

## 2012-02-01 MED ORDER — LEVOTHYROXINE SODIUM 50 MCG PO TABS
50.0000 ug | ORAL_TABLET | Freq: Every day | ORAL | Status: DC
  Administered 2012-02-02: 50 ug via ORAL
  Filled 2012-02-01 (×3): qty 1

## 2012-02-01 MED ORDER — HYDROCORTISONE 10 MG PO TABS
10.0000 mg | ORAL_TABLET | Freq: Every day | ORAL | Status: DC
  Administered 2012-02-01 – 2012-02-02 (×2): 10 mg via ORAL
  Filled 2012-02-01 (×3): qty 1

## 2012-02-01 MED ORDER — COLESTIPOL HCL 1 G PO TABS
2.0000 g | ORAL_TABLET | Freq: Two times a day (BID) | ORAL | Status: DC
  Administered 2012-02-01 – 2012-02-02 (×2): 2 g via ORAL
  Filled 2012-02-01 (×3): qty 2

## 2012-02-01 MED ORDER — HYDROMORPHONE HCL PF 1 MG/ML IJ SOLN
1.0000 mg | INTRAMUSCULAR | Status: DC | PRN
  Administered 2012-02-01 – 2012-02-02 (×3): 1 mg via INTRAVENOUS
  Filled 2012-02-01 (×3): qty 1

## 2012-02-01 MED ORDER — HYDROMORPHONE HCL PF 1 MG/ML IJ SOLN: 1.0000 mg | INTRAMUSCULAR | Status: DC | PRN

## 2012-02-01 MED ORDER — LORAZEPAM 1 MG PO TABS
1.0000 mg | ORAL_TABLET | Freq: Three times a day (TID) | ORAL | Status: DC | PRN
  Administered 2012-02-01 – 2012-02-02 (×3): 1 mg via ORAL
  Filled 2012-02-01 (×3): qty 1

## 2012-02-01 MED ORDER — PANTOPRAZOLE SODIUM 40 MG PO TBEC
40.0000 mg | DELAYED_RELEASE_TABLET | Freq: Every day | ORAL | Status: DC
  Administered 2012-02-01 – 2012-02-02 (×2): 40 mg via ORAL
  Filled 2012-02-01 (×2): qty 1

## 2012-02-01 MED ORDER — NORTRIPTYLINE HCL 10 MG PO CAPS
10.0000 mg | ORAL_CAPSULE | Freq: Every day | ORAL | Status: DC
  Administered 2012-02-01: 10 mg via ORAL
  Filled 2012-02-01 (×3): qty 1

## 2012-02-01 MED ORDER — DICYCLOMINE HCL 20 MG PO TABS
20.0000 mg | ORAL_TABLET | Freq: Two times a day (BID) | ORAL | Status: DC
  Administered 2012-02-01 – 2012-02-02 (×2): 20 mg via ORAL
  Filled 2012-02-01 (×4): qty 1

## 2012-02-01 NOTE — Progress Notes (Signed)
[facility] Progress Note  Subjective:  Says that the diarrhea and abdominal pain are still no better.  Nausea and vomiting controlled with anti-emetics.  Records reviewed from Dr. Gabriela Eves office.  Biopsies seem to say microscopic colitis, but there are times when there was erythema seen endoscopically.  One biopsy of TI showed focal mildly active colitis.  Looks like she had a flex sig to hepatic flexure in 07/2011 but last colonoscopy in these records was 2012.  There is mention that CT enterography was negative for IBD in the past.  Antibiotics have been discontinued.  Objective:  Vital signs in last 24 hours: Temp:  [97.7 F (36.5 C)-98.6 F (37 C)] 97.7 F (36.5 C) (10/30 0517) Pulse Rate:  [80-99] 80  (10/30 0517) Resp:  [16-18] 18  (10/30 0517) BP: (112-152)/(80-94) 112/86 mmHg (10/30 0517) SpO2:  [97 %-100 %] 100 % (10/30 0517) Last BM Date: 02/01/12 General:   Alert, Well-developed, NAD Heart:  Regular rate and rhythm; no murmurs Pulm:  CTAB.  No W/R/R. Abdomen:  Soft, non-distended.  BS present.  Diffuse TTP > on the right side of the abdomen. Extremities:  Without edema. Neurologic:  Alert and  oriented x4;  grossly normal neurologically. Psych:  Alert and cooperative. Normal mood and affect.  Intake/Output from previous day: 10/29 0701 - 10/30 0700 In: 2273 [P.O.:360; I.V.:1913] Out: -   Lab Results:  Basename 01/31/12 0420 01/30/12 1215  WBC 8.7 12.6*  HGB 12.0 12.9  HCT 36.1 37.6  PLT 212 255   BMET  Basename 02/01/12 0405 01/31/12 0420 01/30/12 1215  NA 136 135 136  K 4.0 4.2 3.2*  CL 103 105 98  CO2 25 23 26   GLUCOSE 100* 112* 85  BUN 6 6 12   CREATININE 0.58 0.65 0.73  CALCIUM 8.6 7.6* 8.8   LFT  Basename 01/30/12 1215  PROT 6.1  ALBUMIN 3.4*  AST 12  ALT 15  ALKPHOS 75  BILITOT 0.2*  BILIDIR --  IBILI --    Ct Abdomen Pelvis W Contrast  01/30/2012  *RADIOLOGY REPORT*  Clinical Data: Right-sided abdominal pain.  History of  collagenous colitis.  CT ABDOMEN AND PELVIS WITH CONTRAST  Technique:  Multidetector CT imaging of the abdomen and pelvis was performed following the standard protocol during bolus administration of intravenous contrast.  Contrast: 80mL OMNIPAQUE IOHEXOL 300 MG/ML  SOLN  Comparison: 01/06/2012 CT abdomen and pelvis.Also 08/27/2011.  Findings: Lung bases are clear.  Normal heart size.  No focal hepatic defects.  Normal spleen, adrenal glands, and pancreas.  The gallbladder is surgically absent. Multiple nonobstructing renal calculi, greater on the left.  No evidence for hydronephrosis.  No evidence for bowel obstruction.  Slight calcification of the aortoiliac branches without aneurysmal dilatation.  There are multiple segmental areas of colonic wall thickening (most notable image 44, ascending colon, also possibly image 51 descending colon) consistent with active colitis.  No significant mesenteric inflammatory process.  No visible free fluid, free air, or localized abscess. No appendiceal inflammation.  Prominent mid transverse colon narrowing on image 28 series 2 is improved on delayed images, likely peristalsis.  Unremarkable uterus, ovaries bladder, and rectum.  No free fluid in the cul-de-sac.  Normal lumbar spine.  No visible hernia. Bilateral tubal ligation clips.  Compared with 08/27/2011 scan, the previously identified masses at the lung bases appear to have resolved.  Compared with 01/06/2012 scan, the colitis changes are more pronounced.  IMPRESSION: Multiple segmental areas of colonic wall thickening most notable  in the ascending colon consistent with acute collagenous colitis.  No mesenteric inflammatory process, free fluid, abscess, or perforation. The findings represent an interval change from 01/06/2012.   Original Report Authenticated By: Elsie Stain, M.D.     Assessment / Plan: -Microscopic colitis possibly with some overlapping IBS-D and/or bile-salt diarrhea. ? IBD with findings on CT scan  (typically do not see wall thickening, etc on imaging studies with microscopic colitis). Uncontrolled and has failed several regimens.  -Nausea and vomiting:  Improved with antiemetics. -Chronic hypokalemia.  *Continue IV steroids with methylpred 40 mg daily and anti-emetics for now.  *Will need to be seen at a tertiary medical center to help sort this out since there are several conflicting biopsies and imaging studies, etc. *Will discuss further with Dr. Russella Dar. *Could probably restart her colestid.    LOS: 2 days   ZEHR, JESSICA D.  02/01/2012, 9:25 AM  Pager number [phone]    I have taken an interval history, reviewed the chart and examined the patient. I agree with Otho Darner note, impression and recommendations. I reviewed the extensive GI records forwarded from Dr. Gabriela Eves office. There were no unexpected findings or results in those records. Resume Colestid at a higher dose and continue all other GI medications. No additional recommendations. Advised an outpatient evaluation at [facility] with Dr. Raeanne Barry. Dr. Gabriela Eves office to coordinate referral and send records.  Venita Lick. Russella Dar MD [facility]

## 2012-02-01 NOTE — Progress Notes (Signed)
[facility] PROGRESS NOTE  Dawn Frost WUJ:[id number] DOB: [birth year] DOA: 01/30/2012 PCP: Juan Quam, MD  Assessment/Plan: 1. Acute diarrhea/collagenous colitis/abdominal pain--per GI--resume Colestid, continue other meds. No new recs. Outpatient follow-up at [facility] GI Department with Dr. Raeanne Barry. Dr. Gabriela Eves office to coordinate referral and send records. Pain controlled, wean narcotics. 2. Addison's disease--stable. Resume Cortef. 3. Hypothyroidism--continue Synthroid.  Code Status: full code Family Communication: none present Disposition Plan: home 10/31  Brendia Sacks, MD  [facility] Team 6 Pager [phone]. If 8PM-8AM, please contact night-coverage at [email or web address], password [facility] 02/01/2012, 5:34 PM  LOS: 2 days   Brief narrative: 46 year old woman presented with diarrhea.  Consultants:  GI  Procedures:  None  HPI/Subjective: Feels better, tolerating liquids, no nausea or vomiting. Pain controlled in abdomen.  Objective: Filed Vitals:   01/31/12 1426 01/31/12 2131 02/01/12 0517 02/01/12 1403  BP: 152/94 128/80 112/86 150/85  Pulse: 81 99 80 70  Temp: 98.6 F (37 C) 97.7 F (36.5 C) 97.7 F (36.5 C) 97.3 F (36.3 C)  TempSrc:  Oral Oral   Resp: 16 16 18 18   Height:      Weight:      SpO2: 97% 97% 100% 98%    Intake/Output Summary (Last 24 hours) at 02/01/12 1734 Last data filed at 02/01/12 1300  Gross per 24 hour  Intake   2513 ml  Output      0 ml  Net   2513 ml   Filed Weights   01/30/12 2025 01/31/12 0535  Weight: 61.9 kg (136 lb 7.4 oz) 65.3 kg (143 lb 15.4 oz)    Exam:  General:  Appears calm and comfortable Cardiovascular: RRR, no m/r/g. No LE edema. Respiratory: CTA bilaterally, no w/r/r. Normal respiratory effort. Abdomen: soft, ntnd Psychiatric: grossly normal mood and affect, speech fluent and appropriate  Data Reviewed: Basic Metabolic Panel:  Lab 02/01/12 8657 01/31/12 0420 01/30/12 1215  NA 136  135 136  K 4.0 4.2 3.2*  CL 103 105 98  CO2 25 23 26   GLUCOSE 100* 112* 85  BUN 6 6 12   CREATININE 0.58 0.65 0.73  CALCIUM 8.6 7.6* 8.8  MG 2.1 1.9 2.0  PHOS -- -- --   Liver Function Tests:  Lab 01/30/12 1215  AST 12  ALT 15  ALKPHOS 75  BILITOT 0.2*  PROT 6.1  ALBUMIN 3.4*    Lab 01/30/12 1215  LIPASE 14  AMYLASE --   CBC:  Lab 01/31/12 0420 01/30/12 1215  WBC 8.7 12.6*  NEUTROABS 7.9* 7.7  HGB 12.0 12.9  HCT 36.1 37.6  MCV 107.4* 105.3*  PLT 212 255   Recent Results (from the past 240 hour(s))  CLOSTRIDIUM DIFFICILE BY PCR     Status: Normal   Collection Time   01/30/12  8:03 PM      Component Value Range Status Comment   C difficile by pcr NEGATIVE  NEGATIVE Final   STOOL CULTURE     Status: Normal (Preliminary result)   Collection Time   01/30/12  8:03 PM      Component Value Range Status Comment   Specimen Description STOOL   Final    Special Requests NONE   Final    Culture NO SUSPICIOUS COLONIES, CONTINUING TO HOLD   Final    Report Status PENDING   Incomplete   OVA AND PARASITE EXAMINATION     Status: Normal   Collection Time   01/30/12  8:03 PM      Component Value Range  Status Comment   Specimen Description STOOL   Final    Special Requests NONE   Final    Ova and parasites NO OVA OR PARASITES SEEN   Final    Report Status 02/01/2012 FINAL   Final   URINE CULTURE     Status: Normal   Collection Time   01/31/12 12:53 AM      Component Value Range Status Comment   Specimen Description URINE, CLEAN CATCH   Final    Special Requests NONE   Final    Culture  Setup Time 01/31/2012 09:03   Final    Colony Count 4,000 COLONIES/ML   Final    Culture INSIGNIFICANT GROWTH   Final    Report Status 02/01/2012 FINAL   Final      Studies: No results found.  Scheduled Meds:   . colestipol  2 g Oral BID  . enoxaparin (LOVENOX) injection  40 mg Subcutaneous Q24H  . famotidine (PEPCID) IV  40 mg Intravenous Q24H  . levothyroxine  26 mcg  Intravenous Daily  . methylPREDNISolone (SOLU-MEDROL) injection  40 mg Intravenous Daily  . nicotine  14 mg Transdermal Daily   Continuous Infusions:   . DISCONTD: dextrose 5 % and 0.45 % NaCl with KCl 40 mEq/L 75 mL/hr at 02/01/12 0400    Principal Problem:  *Collagenous colitis Active Problems:  DEPRESSION  Adrenal insufficiency  Hypothyroid  Diarrhea  Abdominal pain  Tobacco abuse  Hypokalemia  Leukocytosis  Dehydration     Brendia Sacks, MD  [facility] Team 6 Pager [phone]. If 8PM-8AM, please contact night-coverage at [email or web address], password [facility] 02/01/2012, 5:34 PM  LOS: 2 days   Time spent: 25 minutes

## 2012-02-02 MED ORDER — OXYCODONE-ACETAMINOPHEN 5-325 MG PO TABS: 2.0000 | ORAL_TABLET | ORAL | Status: DC | PRN

## 2012-02-02 MED ORDER — COLESTIPOL HCL 1 G PO TABS
3.0000 g | ORAL_TABLET | Freq: Two times a day (BID) | ORAL | Status: DC
  Filled 2012-02-02: qty 3

## 2012-02-02 MED ORDER — COLESTIPOL HCL 1 G PO TABS: 3.0000 g | ORAL_TABLET | Freq: Two times a day (BID) | ORAL | Status: DC

## 2012-02-02 MED ORDER — BUDESONIDE 9 MG PO TB24: 9.0000 mg | ORAL_TABLET | Freq: Every day | ORAL | Status: DC

## 2012-02-02 MED ORDER — PROMETHAZINE HCL 12.5 MG PO TABS: 12.5000 mg | ORAL_TABLET | Freq: Four times a day (QID) | ORAL | Status: DC | PRN

## 2012-02-02 MED ORDER — OXYCODONE-ACETAMINOPHEN 5-325 MG PO TABS
2.0000 | ORAL_TABLET | ORAL | Status: DC | PRN
  Administered 2012-02-02 (×2): 2 via ORAL
  Filled 2012-02-02 (×2): qty 2

## 2012-02-02 MED ORDER — PROMETHAZINE HCL 25 MG PO TABS
12.5000 mg | ORAL_TABLET | ORAL | Status: DC | PRN
  Administered 2012-02-02 (×2): 12.5 mg via ORAL
  Filled 2012-02-02 (×2): qty 1

## 2012-02-02 NOTE — Progress Notes (Signed)
Went over d/c instructions with pt. Instructed to follow up with PCP in one week, and appointment with Dr Chesley Mires in January. Instructed to stop taking Entocort EC and to take Uceris instead. Instructed on the change in medications for colestipol, percocet, and phenergan. Pt d/c'd to home with son.

## 2012-02-02 NOTE — Discharge Summary (Signed)
Physician Discharge Summary  Dawn Frost:[id number] DOB: Aug 14, 1965 DOA: 01/30/2012  PCP: Juan Quam, MD GI: Dr. Raeanne Barry  Admit date: 01/30/2012 Discharge date: 02/02/2012  Recommendations for Outpatient Follow-up:  1. Follow-up collagenous colitis, abdominal pain, chronic diarrhea.  2. Consider repeat TFT as an outpatient.  Follow-up Information    Follow up with BLOOMFELD,Dawn Frost. On 04/17/2012. (800 am)       Follow up with MARLETTE,MARNIE, MD. Schedule an appointment as soon as possible for a visit in 1 week.        Discharge Diagnoses:  1. Acute diarrhea/collagenous colitis/abdominal pain 2. Addison's disease 3. Hypothyroidism  Discharge Condition: improved Disposition: home  Diet recommendation: regular  History of present illness:  46 year old woman presented with diarrhea.  Hospital Course:  Dawn Frost was admitted for further evaluation of diarrhea. CT scan suggested acute collagenous colitis. Patient was seen by GI, with recommendations initially for IV steroids. GI reviewed records. Etiology of chronic diarrhea remains somewhat unclear, per GI "Microscopic colitis possibly with some overlapping IBS-D and/or bile-salt diarrhea, Will need to be seen at a tertiary medical center to help sort this out since there are several conflicting biopsies and imaging studies, etc." Arrangements made by GI for outpatient follow-up at [facility] in January. At this point GI recommended increasing Colestid and substituting Uceris for Entocort. No further GI evaluation as an inpatient suggested. As oral intake is adequate, electrolytes and kidney function maintained and pain controlled, no further inpatient treatment suggested at this time.   1. Acute diarrhea/collagenous colitis/abdominal pain--stable, adequate oral intake with unremarkable labs. No further inpatient recommendations per GI (cleared for discharge today per GI). Per GI--Colestid 3 grams BID, stop  Entocort, start uceris 9 mg po daily. Continue Bentyl. Outpatient follow-up at [facility] GI Department with Dr. Raeanne Barry April 17, 2012 at 8:00am.    2. Addison's disease--stable. Continue Cortef.  3. Hypothyroidism--continue Synthroid. T4 slightly low, TSH normal 10/2011.  Consultants:  GI  Procedures:  None  Discharge Instructions  Discharge Orders    Future Orders Please Complete By Expires   Diet general      Discharge instructions      Comments:   Be sure to follow-up with your primary care physician in about 1 week. Call physician or seek immediate medical attention for increased pain, nausea/vomiting, diarrhea or worsening condition.   Activity as tolerated - No restrictions          Medication List     As of 02/02/2012  2:42 PM    STOP taking these medications         budesonide 3 MG 24 hr capsule   Commonly known as: ENTOCORT EC      TAKE these medications         Budesonide 9 MG Tb24   Take 9 mg by mouth daily. Prescription provided by [facility].      colestipol 1 G tablet   Commonly known as: COLESTID   Take 3 tablets (3 g total) by mouth 2 (two) times daily.      dicyclomine 20 MG tablet   Commonly known as: BENTYL   Take 20 mg by mouth 2 (two) times daily.      hydrocortisone 10 MG tablet   Commonly known as: CORTEF   Take 10 mg by mouth daily.      levothyroxine 50 MCG tablet   Commonly known as: SYNTHROID, LEVOTHROID   Take 50 mcg by mouth daily.      LORazepam 1  MG tablet   Commonly known as: ATIVAN   Take 1 mg by mouth every 8 (eight) hours as needed. anxiety      multivitamin with minerals Tabs   Take 1 tablet by mouth daily.      nortriptyline 10 MG capsule   Commonly known as: PAMELOR   Take 10 mg by mouth daily.      oxyCODONE-acetaminophen 5-325 MG per tablet   Commonly known as: PERCOCET/ROXICET   Take 2 tablets by mouth every 4 (four) hours as needed for pain.      pantoprazole 40 MG tablet   Commonly known as:  PROTONIX   Take 1 tablet (40 mg total) by mouth daily.      potassium chloride SA 20 MEQ tablet   Commonly known as: K-DUR,KLOR-CON   Take 20 mEq by mouth daily.      promethazine 12.5 MG tablet   Commonly known as: PHENERGAN   Take 1 tablet (12.5 mg total) by mouth every 6 (six) hours as needed for nausea. For nausea      Vitamin D (Ergocalciferol) 50000 UNITS Caps   Commonly known as: DRISDOL   Take 50,000 Units by mouth every 7 (seven) days. Take on monday        The results of significant diagnostics from this hospitalization (including imaging, microbiology, ancillary and laboratory) are listed below for reference.    Significant Diagnostic Studies: Ct Abdomen Pelvis W Contrast  01/30/2012  *RADIOLOGY REPORT*  Clinical Data: Right-sided abdominal pain.  History of collagenous colitis.  CT ABDOMEN AND PELVIS WITH CONTRAST  Technique:  Multidetector CT imaging of the abdomen and pelvis was performed following the standard protocol during bolus administration of intravenous contrast.  Contrast: 80mL OMNIPAQUE IOHEXOL 300 MG/ML  SOLN  Comparison: 01/06/2012 CT abdomen and pelvis.Also 08/27/2011.  Findings: Lung bases are clear.  Normal heart size.  No focal hepatic defects.  Normal spleen, adrenal glands, and pancreas.  The gallbladder is surgically absent. Multiple nonobstructing renal calculi, greater on the left.  No evidence for hydronephrosis.  No evidence for bowel obstruction.  Slight calcification of the aortoiliac branches without aneurysmal dilatation.  There are multiple segmental areas of colonic wall thickening (most notable image 44, ascending colon, also possibly image 51 descending colon) consistent with active colitis.  No significant mesenteric inflammatory process.  No visible free fluid, free air, or localized abscess. No appendiceal inflammation.  Prominent mid transverse colon narrowing on image 28 series 2 is improved on delayed images, likely peristalsis.  Unremarkable  uterus, ovaries bladder, and rectum.  No free fluid in the cul-de-sac.  Normal lumbar spine.  No visible hernia. Bilateral tubal ligation clips.  Compared with 08/27/2011 scan, the previously identified masses at the lung bases appear to have resolved.  Compared with 01/06/2012 scan, the colitis changes are more pronounced.  IMPRESSION: Multiple segmental areas of colonic wall thickening most notable in the ascending colon consistent with acute collagenous colitis.  No mesenteric inflammatory process, free fluid, abscess, or perforation. The findings represent an interval change from 01/06/2012.   Original Report Authenticated By: Elsie Stain, M.D.    Microbiology: Recent Results (from the past 240 hour(s))  CLOSTRIDIUM DIFFICILE BY PCR     Status: Normal   Collection Time   01/30/12  8:03 PM      Component Value Range Status Comment   C difficile by pcr NEGATIVE  NEGATIVE Final   STOOL CULTURE     Status: Normal (Preliminary result)  Collection Time   01/30/12  8:03 PM      Component Value Range Status Comment   Specimen Description STOOL   Final    Special Requests NONE   Final    Culture NO SUSPICIOUS COLONIES, CONTINUING TO HOLD   Final    Report Status PENDING   Incomplete   OVA AND PARASITE EXAMINATION     Status: Normal   Collection Time   01/30/12  8:03 PM      Component Value Range Status Comment   Specimen Description STOOL   Final    Special Requests NONE   Final    Ova and parasites NO OVA OR PARASITES SEEN   Final    Report Status 02/01/2012 FINAL   Final   URINE CULTURE     Status: Normal   Collection Time   01/31/12 12:53 AM      Component Value Range Status Comment   Specimen Description URINE, CLEAN CATCH   Final    Special Requests NONE   Final    Culture  Setup Time 01/31/2012 09:03   Final    Colony Count 4,000 COLONIES/ML   Final    Culture INSIGNIFICANT GROWTH   Final    Report Status 02/01/2012 FINAL   Final      Labs: Basic Metabolic Panel:  Lab  02/01/12 0405 01/31/12 0420 01/30/12 1215  NA 136 135 136  K 4.0 4.2 3.2*  CL 103 105 98  CO2 25 23 26   GLUCOSE 100* 112* 85  BUN 6 6 12   CREATININE 0.58 0.65 0.73  CALCIUM 8.6 7.6* 8.8  MG 2.1 1.9 2.0  PHOS -- -- --   Liver Function Tests:  Lab 01/30/12 1215  AST 12  ALT 15  ALKPHOS 75  BILITOT 0.2*  PROT 6.1  ALBUMIN 3.4*    Lab 01/30/12 1215  LIPASE 14  AMYLASE --   CBC:  Lab 01/31/12 0420 01/30/12 1215  WBC 8.7 12.6*  NEUTROABS 7.9* 7.7  HGB 12.0 12.9  HCT 36.1 37.6  MCV 107.4* 105.3*  PLT 212 255    Principal Problem:  *Collagenous colitis Active Problems:  DEPRESSION  Adrenal insufficiency  Hypothyroid  Diarrhea  Abdominal pain  Tobacco abuse  Hypokalemia  Leukocytosis  Dehydration   Time coordinating discharge: 25 minutes  Signed:  Brendia Sacks, MD [facility] 02/02/2012, 2:42 PM

## 2012-02-02 NOTE — Progress Notes (Signed)
I have taken an interval history, reviewed the chart and examined the patient. I agree with Otho Darner note, impression and recommendations. Follow up with Dr. Opal Sidles and then at [facility] for ongoing management.  Dawn Frost. Russella Dar MD Clementeen Graham

## 2012-02-02 NOTE — Progress Notes (Signed)
[facility] PROGRESS NOTE  Dawn Frost AVW:[id number] DOB: [birth year] DOA: 01/30/2012 PCP: Juan Quam, MD  Assessment/Plan: 1. Acute diarrhea/collagenous colitis/abdominal pain--stable, adequate oral intake with unremarkable labs. No further inpatient recommendations per GI (cleared for discharge today per GI). Per GI--Colestid 3 grams BID, stop Entocort, start uceris 9 mg po daily. Continue Bentyl. Outpatient follow-up at [facility] GI Department with Dr. Raeanne Barry April 17, 2012 at 8:00am.  2. Addison's disease--stable. Continue Cortef. 3. Hypothyroidism--continue Synthroid.  Code Status: full code Family Communication: none present Disposition Plan: home  Brendia Sacks, MD  [facility] Team 6 Pager [phone]. If 8PM-8AM, please contact night-coverage at [email or web address], password [facility] 02/02/2012, 2:26 PM  LOS: 3 days   Brief narrative: 46 year old woman presented with diarrhea.  Consultants:  GI  Procedures:  None  HPI/Subjective: Feels ok, pain controlled with Percocet. Eating without nausea/vomiting. Several episodes of diarrhea today.  Objective: Filed Vitals:   02/01/12 1403 02/01/12 2100 02/02/12 0515 02/02/12 1324  BP: 150/85 132/87 146/98 148/96  Pulse: 70 92 77 74  Temp: 97.3 F (36.3 C) 98.3 F (36.8 C) 97.4 F (36.3 C) 98.2 F (36.8 C)  TempSrc:  Oral Oral   Resp: 18 18 18 16   Height:      Weight:   64.5 kg (142 lb 3.2 oz)   SpO2: 98% 99% 99% 98%    Intake/Output Summary (Last 24 hours) at 02/02/12 1426 Last data filed at 02/02/12 1300  Gross per 24 hour  Intake   1620 ml  Output      0 ml  Net   1620 ml   Filed Weights   01/30/12 2025 01/31/12 0535 02/02/12 0515  Weight: 61.9 kg (136 lb 7.4 oz) 65.3 kg (143 lb 15.4 oz) 64.5 kg (142 lb 3.2 oz)    Exam:  General:  Appears calm and comfortable Cardiovascular: RRR, no m/r/g. Respiratory: CTA bilaterally, no w/r/r. Normal respiratory effort. Abdomen: soft,  ntnd Psychiatric: grossly normal mood and affect, speech fluent and appropriate  Exam current 10/31  Data Reviewed: Basic Metabolic Panel:  Lab 02/01/12 3086 01/31/12 0420 01/30/12 1215  NA 136 135 136  K 4.0 4.2 3.2*  CL 103 105 98  CO2 25 23 26   GLUCOSE 100* 112* 85  BUN 6 6 12   CREATININE 0.58 0.65 0.73  CALCIUM 8.6 7.6* 8.8  MG 2.1 1.9 2.0  PHOS -- -- --   Liver Function Tests:  Lab 01/30/12 1215  AST 12  ALT 15  ALKPHOS 75  BILITOT 0.2*  PROT 6.1  ALBUMIN 3.4*    Lab 01/30/12 1215  LIPASE 14  AMYLASE --   CBC:  Lab 01/31/12 0420 01/30/12 1215  WBC 8.7 12.6*  NEUTROABS 7.9* 7.7  HGB 12.0 12.9  HCT 36.1 37.6  MCV 107.4* 105.3*  PLT 212 255   Recent Results (from the past 240 hour(s))  CLOSTRIDIUM DIFFICILE BY PCR     Status: Normal   Collection Time   01/30/12  8:03 PM      Component Value Range Status Comment   C difficile by pcr NEGATIVE  NEGATIVE Final   STOOL CULTURE     Status: Normal (Preliminary result)   Collection Time   01/30/12  8:03 PM      Component Value Range Status Comment   Specimen Description STOOL   Final    Special Requests NONE   Final    Culture NO SUSPICIOUS COLONIES, CONTINUING TO HOLD   Final    Report Status PENDING  Incomplete   OVA AND PARASITE EXAMINATION     Status: Normal   Collection Time   01/30/12  8:03 PM      Component Value Range Status Comment   Specimen Description STOOL   Final    Special Requests NONE   Final    Ova and parasites NO OVA OR PARASITES SEEN   Final    Report Status 02/01/2012 FINAL   Final   URINE CULTURE     Status: Normal   Collection Time   01/31/12 12:53 AM      Component Value Range Status Comment   Specimen Description URINE, CLEAN CATCH   Final    Special Requests NONE   Final    Culture  Setup Time 01/31/2012 09:03   Final    Colony Count 4,000 COLONIES/ML   Final    Culture INSIGNIFICANT GROWTH   Final    Report Status 02/01/2012 FINAL   Final      Studies: No results  found.  Scheduled Meds:    . budesonide  9 mg Oral Daily  . colestipol  3 g Oral BID  . dicyclomine  20 mg Oral BID  . enoxaparin (LOVENOX) injection  40 mg Subcutaneous Q24H  . hydrocortisone  10 mg Oral Daily  . levothyroxine  50 mcg Oral QAC breakfast  . nicotine  14 mg Transdermal Daily  . nortriptyline  10 mg Oral Daily  . pantoprazole  40 mg Oral Daily  . DISCONTD: colestipol  2 g Oral BID  . DISCONTD: famotidine (PEPCID) IV  40 mg Intravenous Q24H  . DISCONTD: levothyroxine  26 mcg Intravenous Daily  . DISCONTD: methylPREDNISolone (SOLU-MEDROL) injection  40 mg Intravenous Daily   Continuous Infusions:   Principal Problem:  *Collagenous colitis Active Problems:  DEPRESSION  Adrenal insufficiency  Hypothyroid  Diarrhea  Abdominal pain  Tobacco abuse  Hypokalemia  Leukocytosis  Dehydration     Brendia Sacks, MD  [facility] Team 6 Pager [phone]. If 8PM-8AM, please contact night-coverage at [email or web address], password [facility] 02/02/2012, 2:26 PM  LOS: 3 days

## 2012-02-02 NOTE — Progress Notes (Signed)
[facility] Progress Note  Subjective:  Says that she is really no better.  Had a rough night with diarrhea.  Still with abdominal pain.  Objective:  Vital signs in last 24 hours: Temp:  [97.3 F (36.3 C)-98.3 F (36.8 C)] 97.4 F (36.3 C) (10/31 0515) Pulse Rate:  [70-92] 77  (10/31 0515) Resp:  [18] 18  (10/31 0515) BP: (132-150)/(85-98) 146/98 mmHg (10/31 0515) SpO2:  [98 %-99 %] 99 % (10/31 0515) Weight:  [142 lb 3.2 oz (64.5 kg)] 142 lb 3.2 oz (64.5 kg) (10/31 0515) Last BM Date: 02/02/12 General:   Alert, Well-developed, in NAD Heart:  Regular rate and rhythm; no murmurs Pulm:  CTAB.  No W/R/R. Abdomen:  Soft, non-distended.  BS active.  Diffuse TTP > right side of the abdomen without R/R/G. Extremities:  Without edema. Neurologic:  Alert and  oriented x4;  grossly normal neurologically. Psych:  Alert and cooperative. Normal mood and affect.  Intake/Output from previous day: 10/30 0701 - 10/31 0700 In: 1980 [P.O.:1180; I.V.:800] Out: -   Lab Results:  Basename 01/31/12 0420 01/30/12 1215  WBC 8.7 12.6*  HGB 12.0 12.9  HCT 36.1 37.6  PLT 212 255   BMET  Basename 02/01/12 0405 01/31/12 0420 01/30/12 1215  NA 136 135 136  K 4.0 4.2 3.2*  CL 103 105 98  CO2 25 23 26   GLUCOSE 100* 112* 85  BUN 6 6 12   CREATININE 0.58 0.65 0.73  CALCIUM 8.6 7.6* 8.8   LFT  Basename 01/30/12 1215  PROT 6.1  ALBUMIN 3.4*  AST 12  ALT 15  ALKPHOS 75  BILITOT 0.2*  BILIDIR --  IBILI --   Assessment / Plan: -Microscopic colitis possibly with some overlapping IBS-D and/or bile-salt diarrhea. ? IBD with findings on CT scan (typically do not see wall thickening, etc on imaging studies with microscopic colitis). Uncontrolled and has failed several regimens.  -Nausea and vomiting: Improved with antiemetics.  -Chronic hypokalemia.   *Has been placed back on her home medication regimen of entocort and bentyl.  IV steroids have been discontinued. *I have increased  her colestid again to 3 grams BID. *Also, I am going to give her a prescription for Uceris to takin in place of her Entocort since it has more colonic coverage. *Will need to be seen at a tertiary medical center to help sort this out since there are several conflicting biopsies and imaging studies, etc.  I have made an appointment for her with Dr. Steele Sizer at [facility] on April 17, 2012 at 8:00am.      LOS: 3 days   Stewart Sasaki D.  02/02/2012, 11:12 AM  Pager number [phone]

## 2012-02-03 LAB — STOOL CULTURE

## 2012-02-14 ENCOUNTER — Emergency Department (HOSPITAL_COMMUNITY): Payer: 59

## 2012-02-14 ENCOUNTER — Encounter (HOSPITAL_COMMUNITY): Payer: Self-pay | Admitting: *Deleted

## 2012-02-14 ENCOUNTER — Emergency Department (HOSPITAL_COMMUNITY)
Admission: EM | Admit: 2012-02-14 | Discharge: 2012-02-14 | Disposition: A | Discharge status: Home / Self Care | Payer: 59 | Attending: Emergency Medicine | Admitting: Emergency Medicine

## 2012-02-14 DIAGNOSIS — Z862 Personal history of diseases of the blood and blood-forming organs and certain disorders involving the immune mechanism: Secondary | ICD-10-CM | POA: Insufficient documentation

## 2012-02-14 DIAGNOSIS — K859 Acute pancreatitis without necrosis or infection, unspecified: Secondary | ICD-10-CM | POA: Insufficient documentation

## 2012-02-14 DIAGNOSIS — E079 Disorder of thyroid, unspecified: Secondary | ICD-10-CM | POA: Insufficient documentation

## 2012-02-14 DIAGNOSIS — F17200 Nicotine dependence, unspecified, uncomplicated: Secondary | ICD-10-CM | POA: Insufficient documentation

## 2012-02-14 DIAGNOSIS — K589 Irritable bowel syndrome without diarrhea: Secondary | ICD-10-CM | POA: Insufficient documentation

## 2012-02-14 DIAGNOSIS — K52831 Collagenous colitis: Secondary | ICD-10-CM

## 2012-02-14 DIAGNOSIS — E039 Hypothyroidism, unspecified: Secondary | ICD-10-CM | POA: Insufficient documentation

## 2012-02-14 DIAGNOSIS — E2740 Unspecified adrenocortical insufficiency: Secondary | ICD-10-CM

## 2012-02-14 DIAGNOSIS — E2749 Other adrenocortical insufficiency: Secondary | ICD-10-CM | POA: Insufficient documentation

## 2012-02-14 DIAGNOSIS — M359 Systemic involvement of connective tissue, unspecified: Secondary | ICD-10-CM | POA: Insufficient documentation

## 2012-02-14 DIAGNOSIS — I456 Pre-excitation syndrome: Secondary | ICD-10-CM | POA: Insufficient documentation

## 2012-02-14 DIAGNOSIS — Z79899 Other long term (current) drug therapy: Secondary | ICD-10-CM | POA: Insufficient documentation

## 2012-02-14 DIAGNOSIS — F341 Dysthymic disorder: Secondary | ICD-10-CM | POA: Insufficient documentation

## 2012-02-14 DIAGNOSIS — R109 Unspecified abdominal pain: Secondary | ICD-10-CM | POA: Insufficient documentation

## 2012-02-14 DIAGNOSIS — R112 Nausea with vomiting, unspecified: Secondary | ICD-10-CM | POA: Insufficient documentation

## 2012-02-14 DIAGNOSIS — K5289 Other specified noninfective gastroenteritis and colitis: Secondary | ICD-10-CM | POA: Insufficient documentation

## 2012-02-14 DIAGNOSIS — M549 Dorsalgia, unspecified: Secondary | ICD-10-CM | POA: Insufficient documentation

## 2012-02-14 DIAGNOSIS — R197 Diarrhea, unspecified: Secondary | ICD-10-CM | POA: Insufficient documentation

## 2012-02-14 DIAGNOSIS — Z87442 Personal history of urinary calculi: Secondary | ICD-10-CM | POA: Insufficient documentation

## 2012-02-14 LAB — LIPASE, BLOOD: Lipase: 14 U/L (ref 11–59)

## 2012-02-14 LAB — COMPREHENSIVE METABOLIC PANEL
AST: 20 U/L (ref 0–37)
BUN: 18 mg/dL (ref 6–23)
CO2: 21 mEq/L (ref 19–32)
Calcium: 9 mg/dL (ref 8.4–10.5)
Chloride: 107 mEq/L (ref 96–112)
Creatinine, Ser: 0.71 mg/dL (ref 0.50–1.10)
GFR calc Af Amer: >90 mL/min (ref >90)
GFR calc non Af Amer: >90 mL/min (ref >90)
Glucose, Bld: 86 mg/dL (ref 70–99)
Total Bilirubin: 0.3 mg/dL (ref 0.3–1.2)

## 2012-02-14 LAB — CBC
Hemoglobin: 12.8 g/dL (ref 12.0–15.0)
MCH: 35.9 pg — ABNORMAL HIGH (ref 26.0–34.0)
Platelets: 237 10*3/uL (ref 150–400)
RBC: 3.57 MIL/uL — ABNORMAL LOW (ref 3.87–5.11)
WBC: 7.7 10*3/uL (ref 4.0–10.5)

## 2012-02-14 MED ORDER — PROMETHAZINE HCL 25 MG/ML IJ SOLN
25.0000 mg | Freq: Once | INTRAMUSCULAR | Status: AC
  Administered 2012-02-14: 25 mg via INTRAVENOUS
  Filled 2012-02-14: qty 1

## 2012-02-14 MED ORDER — LACTATED RINGERS IV BOLUS (SEPSIS)
1000.0000 mL | Freq: Once | INTRAVENOUS | Status: AC
  Administered 2012-02-14: 1000 mL via INTRAVENOUS

## 2012-02-14 MED ORDER — HYDROMORPHONE HCL PF 1 MG/ML IJ SOLN
1.0000 mg | INTRAMUSCULAR | Status: DC | PRN
  Administered 2012-02-14 (×4): 1 mg via INTRAVENOUS
  Filled 2012-02-14 (×4): qty 1

## 2012-02-14 MED ORDER — OXYCODONE-ACETAMINOPHEN 5-325 MG PO TABS: 1.0000 | ORAL_TABLET | Freq: Four times a day (QID) | ORAL | Status: DC | PRN

## 2012-02-14 MED ORDER — HYDROMORPHONE HCL PF 1 MG/ML IJ SOLN
1.0000 mg | Freq: Two times a day (BID) | INTRAMUSCULAR | Status: DC | PRN
  Administered 2012-02-14: 1 mg via INTRAVENOUS
  Filled 2012-02-14: qty 1

## 2012-02-14 NOTE — Consult Note (Signed)
[facility] Medical Consultation  Revonda Noblin JWJ:[id number] DOB: [birth year] DOA: 02/14/2012 PCP: Juan Quam, MD   Requesting physician: Dr. Rulon Abide Date of consultation: 02/14/12 Reason for consultation: abdominal pain, nausea, vomiting, collagenous colitis  Impression/Recommendations 1-Acute diarrhea/abdominal pain and nausea and vomiting: Will recommend to help with her symptoms and r/o any side effects from colestid to decrease dose to 1g BID; will also change her protonis to 40mg  BID. She needs to continue Uceris and also Budenoside as prescribed by GI and will recommend follow up at [facility] for further evaluation and treatment. Patient will benefit of multiple small meals per day and plenty of fluids to keep herself hydrated. At this point not much to offer as inpatient and patient in agreement with plan of care and recommendations. Will continue PRN antiemetics and pain medications.  2-Hypothyroidism: continue synthroid  3-addison's disease: stable; continue hydrocortisone.  4-anxiety/depression: continue ativan as needed and nortriptyline daily.  Thanks for this consultation and do not hesitate to contact the internal medicine department if further assistance needed with Ms. Clent Ridges.  Chief Complaint: Nausea, vomiting, abdominal pain  HPI:  46 y/o female with PMH of Addison's disease, anxiety, depression, IBS, collagenous colitis and hypothyroidism; came to the hospital complaining of intractable nausea, vomiting, abdominal pain and diarrhea. Patient reports symptoms has been present since her discharge on 01/30/12, but they have apparently worsen for the last 3 days; she endorses unable to keep anything dow. After reviewing with her the fact that all her labs are stable and WNL; she shared that she is frustrated with her underlying condition and the fact she have good days and bad days and expressed experiencing some difficulty taking all her pills by mouth. I explained  results in the ED even the findings of mild ileus  On abdominal x-ray and what I can offered inside the hospital for her. Patient decide to try to go home and if her condition worsen she will either come back or go to [facility]. She denies any fever, chills, CP, hematemesis, melena, or any other complaints.  Review of Systems:  Negative except as otherwise mentioned on HPI.  Past Medical History  Diagnosis Date  . Collagenous colitis   . WPW (Wolff-Parkinson-White syndrome)   . Kidney stone   . Addison disease   . Thyroid disease   . Clostridium difficile diarrhea   . Anxiety and depression   . Chronic diarrhea   . Chronic back pain   . Drug-seeking behavior   . Irritable bowel syndrome (IBS)   . Anemia   . Kidney stone   . Pancreatitis    Past Surgical History  Procedure Date  . Cholecystectomy   . Tubal ligation   . Lithotripsy   . Sphincterotomy   . Cardiac electrophysiology mapping and ablation     for WPW  . Video bronchoscopy 08/31/2011    Procedure: VIDEO BRONCHOSCOPY WITH FLUORO;  Surgeon: Storm Frisk, MD;  Location: [facility];  Service: Cardiopulmonary;  Laterality: N/A;   Social History:  reports that she has been smoking Cigarettes.  She has a 15 pack-year smoking history. She has never used smokeless tobacco. She reports that she does not drink alcohol or use illicit drugs. Lives at home and do not require any assistance with ADL's  Allergies  Allergen Reactions  . Ambien (Zolpidem Tartrate) Other (See Comments)    headache  . Nsaids Other (See Comments)    Bad for colitis  . Zofran Other (See Comments)    Headache  Family History  Problem Relation Age of Onset  . Prostate cancer Father   . Coronary artery disease Maternal Grandfather     Prior to Admission medications   Medication Sig Start Date End Date Taking? Authorizing Provider  Budesonide 9 MG TB24 Take 9 mg by mouth daily. Prescription provided by [facility]. 02/02/12  Yes Standley Brooking, MD  colestipol (COLESTID) 1 G tablet Take 3 tablets (3 g total) by mouth 2 (two) times daily. 02/02/12  Yes Standley Brooking, MD  dicyclomine (BENTYL) 20 MG tablet Take 20 mg by mouth 2 (two) times daily. 10/20/11 10/19/12 Yes Renne Crigler, PA  hydrocortisone (CORTEF) 10 MG tablet Take 10 mg by mouth daily.   Yes Historical Provider, MD  levothyroxine (SYNTHROID, LEVOTHROID) 50 MCG tablet Take 50 mcg by mouth daily.   Yes Historical Provider, MD  LORazepam (ATIVAN) 1 MG tablet Take 1 mg by mouth every 8 (eight) hours as needed. anxiety   Yes Historical Provider, MD  Multiple Vitamin (MULITIVITAMIN WITH MINERALS) TABS Take 1 tablet by mouth daily.    Yes Historical Provider, MD  nortriptyline (PAMELOR) 10 MG capsule Take 10 mg by mouth daily.  10/12/11  Yes Larena Sox, MD  oxyCODONE-acetaminophen (ROXICET) 5-325 MG per tablet Take 2 tablets by mouth every 4 (four) hours as needed for pain. 02/02/12  Yes Standley Brooking, MD  pantoprazole (PROTONIX) 40 MG tablet Take 1 tablet (40 mg total) by mouth daily. 09/23/11 09/22/12 Yes William S Minor, NP  potassium chloride SA (K-DUR,KLOR-CON) 20 MEQ tablet Take 20 mEq by mouth daily.   Yes Historical Provider, MD  promethazine (PHENERGAN) 12.5 MG tablet Take 1 tablet (12.5 mg total) by mouth every 6 (six) hours as needed for nausea. For nausea 02/02/12  Yes Standley Brooking, MD  Vitamin D, Ergocalciferol, (DRISDOL) 50000 UNITS CAPS Take 50,000 Units by mouth every 7 (seven) days. Take on monday   Yes Historical Provider, MD   Physical Exam: Blood pressure 139/82, pulse 112, temperature 98.5 F (36.9 C), temperature source Oral, resp. rate 20, SpO2 99.00%. Filed Vitals:   02/14/12 0923 02/14/12 1157  BP: 112/85 139/82  Pulse: 107 112  Temp: 98.1 F (36.7 C) 98.5 F (36.9 C)  TempSrc: Oral   Resp: 16 20  SpO2: 98% 99%     General:  Moist mucose membranes, afebrile, comfortable appearance, no distress  Eyes: PERRL, no icterus, no  nystagmus, EOMI  ENT: no erythema or exudates inside her mouth  Neck: supple, no bruits and no thyromegaly  Cardiovascular: S1 and S2, no rubs, no gallops and no murmurs  Respiratory: CTA bilaterally  Abdomen: soft, no distended, no guarding; pain to deep palpation on her LLQ and also epigastric area and RLQ. Positive BS  Skin: no rash, no petechiae  Musculoskeletal: no joint swelling or erythema, FROM  Psychiatric: Frustration and crying spells with her chronic condition expressed, but no SI, no hallucinations  Neurologic: CN intact, no focal motor or sensory deficit.  Labs on Admission:  Basic Metabolic Panel:  Lab 02/14/12 0454  NA 138  K 4.2  CL 107  CO2 21  GLUCOSE 86  BUN 18  CREATININE 0.71  CALCIUM 9.0  MG --  PHOS --   Liver Function Tests:  Lab 02/14/12 1035  AST 20  ALT 19  ALKPHOS 81  BILITOT 0.3  PROT 6.8  ALBUMIN 3.7    Lab 02/14/12 1035  LIPASE 14  AMYLASE --   CBC:  Lab 02/14/12 1035  WBC 7.7  NEUTROABS --  HGB 12.8  HCT 38.1  MCV 106.7*  PLT 237    Radiological Exams on Admission: Dg Abd 2 Views  02/14/2012  *RADIOLOGY REPORT*  Clinical Data: Recurrent colitis.  Right-sided abdominal pain.  ABDOMEN - 2 VIEW  Comparison: CT of the abdomen pelvis 01/30/2012.  Findings: A loop of bowel in the left abdomen demonstrates a fluid level is otherwise relatively featureless.  There is relative paucity of gas throughout the remainder of the abdomen.  Surgical clips are noted in the gallbladder fossa.  The axial skeleton is unremarkable.  IMPRESSION:  1.  Dilated loop of bowel in the left abdomen with a fluid level compatible with partial obstruction or ileus.  I favor the latter in association with the patient's known collagenous colitis. 2.  No free air or more proximal obstruction.   Original Report Authenticated By: Marin Roberts, M.D.     Time spent: >30 minutes  Dakwon Wenberg [facility] Pager [phone]  If 7PM-7AM,  please contact night-coverage [email or web address] Password [facility] 02/14/2012, 3:04 PM

## 2012-02-14 NOTE — ED Provider Notes (Addendum)
History     CSN: [id number]  Arrival date & time 02/14/12  0905   First MD Initiated Contact with Patient 02/14/12 [phone]      Chief Complaint  Patient presents with  . Abdominal Pain  . collitis flare up     (Consider location/radiation/quality/duration/timing/severity/associated sxs/prior treatment) HPICassandra Dowse is a 46 y.o. female who was recently (October 28) discharged from the hospital with diagnosis of acute diarrhea/collagenous colitis/abdominal pain concurrently with Addison's disease and hypothyroidism.  She's had 2 years of problems with abdominal pain as well as watery diarrhea. These problems somewhat resolved on her last hospital stay however last Saturday her problems came back with respect to pain and watery diarrhea, the pain is on the right side of her stomach, 8/10, and gripping/squeezing character, it is constant but does occasionally worsen in colicky waves, and associated with nausea and non-bloody non-bilious vomiting with nonbloody stool which is described as being watery, it is of various quantities and is about 2-3 times hourly. She's had central with swallowing her antiemetics-Phenergan, she says she gags on the pill and throws it up occasionally as well. She's had no fevers but she does get sweats with having diarrhea (almost every episode she has sweating before she has diarrhea) she denies dysuria, frequency, denies any hematuria or off colored urine.  Had holter monitor on 21 days for WPW see if recurrent (mailed it yesterday.)  Past Medical History  Diagnosis Date  . Collagenous colitis   . WPW (Wolff-Parkinson-White syndrome)   . Kidney stone   . Addison disease   . Thyroid disease   . Clostridium difficile diarrhea   . Anxiety and depression   . Chronic diarrhea   . Chronic back pain   . Drug-seeking behavior   . Irritable bowel syndrome (IBS)   . Anemia   . Kidney stone   . Pancreatitis     Past Surgical History  Procedure Date  .  Cholecystectomy   . Tubal ligation   . Lithotripsy   . Sphincterotomy   . Cardiac electrophysiology mapping and ablation     for WPW  . Video bronchoscopy 08/31/2011    Procedure: VIDEO BRONCHOSCOPY WITH FLUORO;  Surgeon: Storm Frisk, MD;  Location: [facility];  Service: Cardiopulmonary;  Laterality: N/A;    Family History  Problem Relation Age of Onset  . Prostate cancer Father   . Coronary artery disease Maternal Grandfather     History  Substance Use Topics  . Smoking status: Current Every Day Smoker -- 0.5 packs/day for 30 years    Types: Cigarettes  . Smokeless tobacco: Never Used  . Alcohol Use: No    OB History    Grav Para Term Preterm Abortions TAB SAB Ect Mult Living   3 3              Review of Systems  Nursing notes reviewed.  Electronic medical record reviewed. VITAL SIGNS:   Filed Vitals:   02/14/12 0923 02/14/12 1157  BP: 112/85 139/82  Pulse: 107 112  Temp: 98.1 F (36.7 C) 98.5 F (36.9 C)  TempSrc: Oral   Resp: 16 20  SpO2: 98% 99%   CONSTITUTIONAL: Awake, oriented, appears non-toxic HENT: Atraumatic, normocephalic, oral mucosa pink and moist, airway patent. Nares patent without drainage. External ears normal. EYES: Conjunctiva clear, EOMI, PERRLA NECK: Trachea midline, non-tender, supple CARDIOVASCULAR: Normal heart rate, Normal rhythm, No murmurs, rubs, gallops PULMONARY/CHEST: Clear to auscultation, no rhonchi, wheezes, or rales. Symmetrical breath sounds. Non-tender.  ABDOMINAL: Non-distended, soft, diffusely tender to palpation, no rebound or guarding. small ecchymotic area she says was under an electrode from her Holter.  BS normal. NEUROLOGIC: Non-focal, moving all four extremities, no gross sensory or motor deficits. EXTREMITIES: No clubbing, cyanosis, or edema SKIN: Warm, Dry, No erythema, No rash  Allergies  Ambien; Nsaids; and Zofran  Home Medications   Current Outpatient Rx  Name  Route  Sig  Dispense  Refill  .  BUDESONIDE 9 MG PO TB24   Oral   Take 9 mg by mouth daily. Prescription provided by [facility].         Marland Kitchen COLESTIPOL HCL 1 G PO TABS   Oral   Take 3 tablets (3 g total) by mouth 2 (two) times daily.   180 tablet   0   . DICYCLOMINE HCL 20 MG PO TABS   Oral   Take 20 mg by mouth 2 (two) times daily.         Marland Kitchen HYDROCORTISONE 10 MG PO TABS   Oral   Take 10 mg by mouth daily.         Marland Kitchen LEVOTHYROXINE SODIUM 50 MCG PO TABS   Oral   Take 50 mcg by mouth daily.         Marland Kitchen LORAZEPAM 1 MG PO TABS   Oral   Take 1 mg by mouth every 8 (eight) hours as needed. anxiety         . ADULT MULTIVITAMIN W/MINERALS CH   Oral   Take 1 tablet by mouth daily.          Marland Kitchen NORTRIPTYLINE HCL 10 MG PO CAPS   Oral   Take 10 mg by mouth daily.          . OXYCODONE-ACETAMINOPHEN 5-325 MG PO TABS   Oral   Take 2 tablets by mouth every 4 (four) hours as needed for pain.   40 tablet   0   . PANTOPRAZOLE SODIUM 40 MG PO TBEC   Oral   Take 1 tablet (40 mg total) by mouth daily.   30 tablet   1   . POTASSIUM CHLORIDE CRYS ER 20 MEQ PO TBCR   Oral   Take 20 mEq by mouth daily.         Marland Kitchen PROMETHAZINE HCL 12.5 MG PO TABS   Oral   Take 1 tablet (12.5 mg total) by mouth every 6 (six) hours as needed for nausea. For nausea   30 tablet   0   . VITAMIN D (ERGOCALCIFEROL) 50000 UNITS PO CAPS   Oral   Take 50,000 Units by mouth every 7 (seven) days. Take on monday           BP 112/85  Pulse 107  Temp 98.1 F (36.7 C) (Oral)  Resp 16  SpO2 98%  Physical Exam  Abdominal:      ED Course  Procedures (including critical care time)  Labs Reviewed  CBC - Abnormal; Notable for the following:    RBC 3.57 (*)     MCV 106.7 (*)     MCH 35.9 (*)     All other components within normal limits  COMPREHENSIVE METABOLIC PANEL  LIPASE, BLOOD   Dg Abd 2 Views  02/14/2012  *RADIOLOGY REPORT*  Clinical Data: Recurrent colitis.  Right-sided abdominal pain.  ABDOMEN - 2 VIEW   Comparison: CT of the abdomen pelvis 01/30/2012.  Findings: A loop of bowel in the left abdomen demonstrates a fluid  level is otherwise relatively featureless.  There is relative paucity of gas throughout the remainder of the abdomen.  Surgical clips are noted in the gallbladder fossa.  The axial skeleton is unremarkable.  IMPRESSION:  1.  Dilated loop of bowel in the left abdomen with a fluid level compatible with partial obstruction or ileus.  I favor the latter in association with the patient's known collagenous colitis. 2.  No free air or more proximal obstruction.   Original Report Authenticated By: Marin Roberts, M.D.      1. Abdominal pain   2. Nausea and vomiting   3. Diarrhea   4. Adrenal insufficiency   5. Collagenous colitis   6. Hypothyroid       MDM  Kaily Esqueda is a 46 y.o. female history of chronic abdominal pain, collagenous colitis as well as watery diarrhea presents with exacerbation of the same. She also having trouble swallowing her medicines.  Patient has been having large volumes of diarrhea as well as vomiting the last couple of days we'll check some labs to make sure she's not dehydrated clinically she does not appear dehydrated. We'll give her some pain medicine as well as nausea medicine through an IV and IV fluid. Also obtain x-ray of her abdomen  as her colitis could be a lead point for obstruction.  Patient's x-ray does show a loop of bowel the left abdomen showing a single fluid level-radiology reads is x-rays being otherwise featureless is consistent with partial obstruction or ileus which is considered more likely, even history of colitis. Patient has got good relief from nausea medicine and pain medicine however, she's had these exacerbations before and thinks that if she goes home she'll be back for IV antiemetics.    Discussed with Dr. Alferd Patee hospitalist - he saw the patient and in a joint decision making, decided the patient could try for outpatient  therapy with pain medicine and antiemetics.  I gave the patient's more medicine by IV and discussed this plan with her. Patient agrees with this plan to go home and try pain medicine antiemetics and bowel rest for a couple of days. She is supposed to have followup in January with [facility] for further workup, and my discussion with Dr. Alferd Patee, patient should try to return to work for a stiff she has a further symptoms unless they're emergency in which case she can always return to this ER.  I explained the diagnosis and have given explicit precautions to return to the ER including worsening pain not controlled with medications, signs of dehydration including dry mouth, headache, dizziness, shortness of breath, chest pain or bloody stool or any other new or worsening symptoms. The patient understands and accepts the medical plan as it's been dictated and I have answered their questions. Discharge instructions concerning home care and prescriptions have been given.  The patient is STABLE and is discharged to home in good condition.          Jones Skene, MD 02/14/12 1712  Jones Skene, MD 02/14/12 1610

## 2012-02-14 NOTE — ED Notes (Addendum)
Pt reports right sided abdominal pain and abdominal pain below sternum. Vomiting (3-5x/day), occasional dizziness, constant diarrhea, difficulty swallowing x3 days. Reports it hurts to swallow and reports "sometimes when she tries to eat the food gets stuck". Pt unsure if the difficulty swallowing is because pt has been throwing up. 8/10. Hx of collitis, pt thinks it is a flare up. Pt unable to keep foods down, and unable to fully swallow liquids because of "soemthing stuck in her throat". Pt denies SOB or difficulty breathing. Admitted recently to hospital for collitis.

## 2012-03-02 ENCOUNTER — Emergency Department (HOSPITAL_BASED_OUTPATIENT_CLINIC_OR_DEPARTMENT_OTHER): Payer: 59

## 2012-03-02 ENCOUNTER — Emergency Department (HOSPITAL_BASED_OUTPATIENT_CLINIC_OR_DEPARTMENT_OTHER)
Admission: EM | Admit: 2012-03-02 | Discharge: 2012-03-02 | Disposition: A | Discharge status: Home / Self Care | Payer: 59 | Attending: Emergency Medicine | Admitting: Emergency Medicine

## 2012-03-02 ENCOUNTER — Encounter (HOSPITAL_BASED_OUTPATIENT_CLINIC_OR_DEPARTMENT_OTHER): Payer: Self-pay

## 2012-03-02 ENCOUNTER — Telehealth: Payer: Self-pay | Admitting: Internal Medicine

## 2012-03-02 DIAGNOSIS — K589 Irritable bowel syndrome without diarrhea: Secondary | ICD-10-CM | POA: Insufficient documentation

## 2012-03-02 DIAGNOSIS — K5289 Other specified noninfective gastroenteritis and colitis: Secondary | ICD-10-CM | POA: Insufficient documentation

## 2012-03-02 DIAGNOSIS — Z79899 Other long term (current) drug therapy: Secondary | ICD-10-CM | POA: Insufficient documentation

## 2012-03-02 DIAGNOSIS — Z765 Malingerer [conscious simulation]: Secondary | ICD-10-CM | POA: Insufficient documentation

## 2012-03-02 DIAGNOSIS — Z8719 Personal history of other diseases of the digestive system: Secondary | ICD-10-CM | POA: Insufficient documentation

## 2012-03-02 DIAGNOSIS — G8929 Other chronic pain: Secondary | ICD-10-CM | POA: Insufficient documentation

## 2012-03-02 DIAGNOSIS — F341 Dysthymic disorder: Secondary | ICD-10-CM | POA: Insufficient documentation

## 2012-03-02 DIAGNOSIS — M549 Dorsalgia, unspecified: Secondary | ICD-10-CM | POA: Insufficient documentation

## 2012-03-02 DIAGNOSIS — Z87442 Personal history of urinary calculi: Secondary | ICD-10-CM | POA: Insufficient documentation

## 2012-03-02 DIAGNOSIS — Z8619 Personal history of other infectious and parasitic diseases: Secondary | ICD-10-CM | POA: Insufficient documentation

## 2012-03-02 DIAGNOSIS — E079 Disorder of thyroid, unspecified: Secondary | ICD-10-CM | POA: Insufficient documentation

## 2012-03-02 DIAGNOSIS — D510 Vitamin B12 deficiency anemia due to intrinsic factor deficiency: Secondary | ICD-10-CM | POA: Insufficient documentation

## 2012-03-02 DIAGNOSIS — I456 Pre-excitation syndrome: Secondary | ICD-10-CM | POA: Insufficient documentation

## 2012-03-02 DIAGNOSIS — K529 Noninfective gastroenteritis and colitis, unspecified: Secondary | ICD-10-CM

## 2012-03-02 DIAGNOSIS — F17200 Nicotine dependence, unspecified, uncomplicated: Secondary | ICD-10-CM | POA: Insufficient documentation

## 2012-03-02 LAB — COMPREHENSIVE METABOLIC PANEL
ALT: 8 U/L (ref 0–35)
AST: 12 U/L (ref 0–37)
Alkaline Phosphatase: 85 U/L (ref 39–117)
CO2: 24 mEq/L (ref 19–32)
Calcium: 9 mg/dL (ref 8.4–10.5)
GFR calc Af Amer: >90 mL/min (ref >90)
GFR calc non Af Amer: >90 mL/min (ref >90)
Glucose, Bld: 99 mg/dL (ref 70–99)
Potassium: 3.2 mEq/L — ABNORMAL LOW (ref 3.5–5.1)
Sodium: 140 mEq/L (ref 135–145)
Total Protein: 6.8 g/dL (ref 6.0–8.3)

## 2012-03-02 LAB — CBC
HCT: 36.2 % (ref 36.0–46.0)
Hemoglobin: 12.5 g/dL (ref 12.0–15.0)
MCHC: 34.5 g/dL (ref 30.0–36.0)
WBC: 9.1 10*3/uL (ref 4.0–10.5)

## 2012-03-02 LAB — LIPASE, BLOOD: Lipase: 12 U/L (ref 11–59)

## 2012-03-02 MED ORDER — PROMETHAZINE HCL 25 MG/ML IJ SOLN
25.0000 mg | Freq: Once | INTRAMUSCULAR | Status: AC
  Administered 2012-03-02: 25 mg via INTRAVENOUS
  Filled 2012-03-02: qty 1

## 2012-03-02 MED ORDER — HYDROMORPHONE HCL PF 1 MG/ML IJ SOLN
1.0000 mg | Freq: Once | INTRAMUSCULAR | Status: AC
  Administered 2012-03-02: 1 mg via INTRAVENOUS
  Filled 2012-03-02: qty 1

## 2012-03-02 MED ORDER — METRONIDAZOLE 500 MG PO TABS: 500.0000 mg | ORAL_TABLET | Freq: Two times a day (BID) | ORAL | Status: DC

## 2012-03-02 MED ORDER — IOHEXOL 300 MG/ML  SOLN
100.0000 mL | Freq: Once | INTRAMUSCULAR | Status: AC | PRN
  Administered 2012-03-02: 100 mL via INTRAVENOUS

## 2012-03-02 MED ORDER — CIPROFLOXACIN HCL 500 MG PO TABS: 500.0000 mg | ORAL_TABLET | Freq: Two times a day (BID) | ORAL | Status: DC

## 2012-03-02 MED ORDER — PROMETHAZINE HCL 25 MG PO TABS: 25.0000 mg | ORAL_TABLET | Freq: Four times a day (QID) | ORAL | Status: DC | PRN

## 2012-03-02 MED ORDER — SODIUM CHLORIDE 0.9 % IV SOLN
Freq: Once | INTRAVENOUS | Status: AC
  Administered 2012-03-02: 1000 mL via INTRAVENOUS

## 2012-03-02 NOTE — ED Notes (Signed)
Patient ambulatory to the restroom with standby assistance only.  Gait steady.

## 2012-03-02 NOTE — ED Notes (Signed)
C/o right side abd pain, substernal chest pain-started yesterday-hx of same c/o pain with [facility] ED visit approx 1 week ago

## 2012-03-02 NOTE — ED Provider Notes (Signed)
History     CSN: [id number]  Arrival date & time 03/02/12  1337   First MD Initiated Contact with Patient 03/02/12 1415      Chief Complaint  Patient presents with  . Abdominal Pain    (Consider location/radiation/quality/duration/timing/severity/associated sxs/prior treatment) HPI Comments: This is a 46 year old female, who presents to the emergency department with chief complaint of right-sided abdominal pain. She was seen here 3 weeks ago for similar complaints. She has past medical history remarkable for IBS. She states that her pain came on yesterday. She states that it is also epigastric pain radiates to her back. She denies drinking or history of gallstones. She is tender to the right lower quadrant but without rebound tenderness or guarding. No peritoneal signs. Patient states that her pain is 8/10. She endorses abdominal pain, nausea, and intermittent diarrhea and constipation which is normal for her. She denies fever, headache, chest pain, shortness of breath, vomiting, dysuria, unusual vaginal discharge, peripheral edema, numbness or tingling of the extremities.  The history is provided by the patient. No language interpreter was used.    Past Medical History  Diagnosis Date  . Collagenous colitis   . WPW (Wolff-Parkinson-White syndrome)   . Kidney stone   . Addison disease   . Thyroid disease   . Clostridium difficile diarrhea   . Anxiety and depression   . Chronic diarrhea   . Chronic back pain   . Drug-seeking behavior   . Irritable bowel syndrome (IBS)   . Anemia   . Kidney stone   . Pancreatitis     Past Surgical History  Procedure Date  . Cholecystectomy   . Tubal ligation   . Lithotripsy   . Sphincterotomy   . Cardiac electrophysiology mapping and ablation     for WPW  . Video bronchoscopy 08/31/2011    Procedure: VIDEO BRONCHOSCOPY WITH FLUORO;  Surgeon: Storm Frisk, MD;  Location: [facility];  Service: Cardiopulmonary;  Laterality: N/A;     Family History  Problem Relation Age of Onset  . Prostate cancer Father   . Coronary artery disease Maternal Grandfather     History  Substance Use Topics  . Smoking status: Current Every Day Smoker -- 0.5 packs/day for 30 years    Types: Cigarettes  . Smokeless tobacco: Never Used  . Alcohol Use: Yes    OB History    Grav Para Term Preterm Abortions TAB SAB Ect Mult Living   3 3              Review of Systems  All other systems reviewed and are negative.    Allergies  Ambien; Nsaids; and Zofran  Home Medications   Current Outpatient Rx  Name  Route  Sig  Dispense  Refill  . HYDROCODONE-ACETAMINOPHEN 5-325 MG PO TABS   Oral   Take 1 tablet by mouth every 6 (six) hours as needed.         . BUDESONIDE 9 MG PO TB24   Oral   Take 9 mg by mouth daily. Prescription provided by [facility].         Marland Kitchen COLESTIPOL HCL 1 G PO TABS   Oral   Take 3 tablets (3 g total) by mouth 2 (two) times daily.   180 tablet   0   . DICYCLOMINE HCL 20 MG PO TABS   Oral   Take 20 mg by mouth 2 (two) times daily.         Marland Kitchen HYDROCORTISONE 10  MG PO TABS   Oral   Take 10 mg by mouth daily.         Marland Kitchen LEVOTHYROXINE SODIUM 50 MCG PO TABS   Oral   Take 50 mcg by mouth daily.         Marland Kitchen LORAZEPAM 1 MG PO TABS   Oral   Take 1 mg by mouth every 8 (eight) hours as needed. anxiety         . ADULT MULTIVITAMIN W/MINERALS CH   Oral   Take 1 tablet by mouth daily.          Marland Kitchen NORTRIPTYLINE HCL 10 MG PO CAPS   Oral   Take 10 mg by mouth daily.          . OXYCODONE-ACETAMINOPHEN 5-325 MG PO TABS   Oral   Take 1-2 tablets by mouth every 6 (six) hours as needed for pain.   17 tablet   0   . OXYCODONE-ACETAMINOPHEN 5-325 MG PO TABS   Oral   Take 2 tablets by mouth every 4 (four) hours as needed for pain.   40 tablet   0   . PANTOPRAZOLE SODIUM 40 MG PO TBEC   Oral   Take 1 tablet (40 mg total) by mouth daily.   30 tablet   1   . POTASSIUM CHLORIDE CRYS ER 20  MEQ PO TBCR   Oral   Take 20 mEq by mouth daily.         Marland Kitchen PROMETHAZINE HCL 12.5 MG PO TABS   Oral   Take 1 tablet (12.5 mg total) by mouth every 6 (six) hours as needed for nausea. For nausea   30 tablet   0   . VITAMIN D (ERGOCALCIFEROL) 50000 UNITS PO CAPS   Oral   Take 50,000 Units by mouth every 7 (seven) days. Take on monday           BP 128/108  Pulse 116  Temp 98.9 F (37.2 C) (Oral)  Resp 22  SpO2 99%  Physical Exam  Nursing note and vitals reviewed. Constitutional: She is oriented to person, place, and time. She appears well-developed and well-nourished.  HENT:  Head: Normocephalic and atraumatic.  Eyes: Conjunctivae normal and EOM are normal. Pupils are equal, round, and reactive to light.  Neck: Normal range of motion. Neck supple.  Cardiovascular: Normal rate and regular rhythm.  Exam reveals no gallop and no friction rub.   No murmur heard. Pulmonary/Chest: Effort normal and breath sounds normal. No respiratory distress. She has no wheezes. She has no rales. She exhibits no tenderness.  Abdominal: Soft. Bowel sounds are normal. She exhibits no distension and no mass. There is tenderness. There is no rebound and no guarding.       Tenderness in the right lower quadrant, but without rebound tenderness or guarding. Some epigastric tenderness is also noted.  Musculoskeletal: Normal range of motion. She exhibits no edema and no tenderness.  Neurological: She is alert and oriented to person, place, and time.  Skin: Skin is warm and dry.  Psychiatric: She has a normal mood and affect. Her behavior is normal. Judgment and thought content normal.    ED Course  Procedures (including critical care time)  Labs Reviewed  CBC - Abnormal; Notable for the following:    RBC 3.45 (*)     MCV 104.9 (*)     MCH 36.2 (*)     All other components within normal limits  COMPREHENSIVE METABOLIC PANEL -  Abnormal; Notable for the following:    Potassium 3.2 (*)     Total  Bilirubin 0.2 (*)     All other components within normal limits  LIPASE, BLOOD   Results for orders placed during the hospital encounter of 03/02/12  CBC      Component Value Range   WBC 9.1  4.0 - 10.5 K/uL   RBC 3.45 (*) 3.87 - 5.11 MIL/uL   Hemoglobin 12.5  12.0 - 15.0 g/dL   HCT 96.0  45.4 - 09.8 %   MCV 104.9 (*) 78.0 - 100.0 fL   MCH 36.2 (*) 26.0 - 34.0 pg   MCHC 34.5  30.0 - 36.0 g/dL   RDW 11.9  14.7 - 82.9 %   Platelets 221  150 - 400 K/uL  LIPASE, BLOOD      Component Value Range   Lipase 12  11 - 59 U/L  COMPREHENSIVE METABOLIC PANEL      Component Value Range   Sodium 140  135 - 145 mEq/L   Potassium 3.2 (*) 3.5 - 5.1 mEq/L   Chloride 103  96 - 112 mEq/L   CO2 24  19 - 32 mEq/L   Glucose, Bld 99  70 - 99 mg/dL   BUN 10  6 - 23 mg/dL   Creatinine, Ser 5.62  0.50 - 1.10 mg/dL   Calcium 9.0  8.4 - 13.0 mg/dL   Total Protein 6.8  6.0 - 8.3 g/dL   Albumin 3.7  3.5 - 5.2 g/dL   AST 12  0 - 37 U/L   ALT 8  0 - 35 U/L   Alkaline Phosphatase 85  39 - 117 U/L   Total Bilirubin 0.2 (*) 0.3 - 1.2 mg/dL   GFR calc non Af Amer >90  >90 mL/min   GFR calc Af Amer >90  >90 mL/min   Results for orders placed during the hospital encounter of 03/02/12  CBC      Component Value Range   WBC 9.1  4.0 - 10.5 K/uL   RBC 3.45 (*) 3.87 - 5.11 MIL/uL   Hemoglobin 12.5  12.0 - 15.0 g/dL   HCT 86.5  78.4 - 69.6 %   MCV 104.9 (*) 78.0 - 100.0 fL   MCH 36.2 (*) 26.0 - 34.0 pg   MCHC 34.5  30.0 - 36.0 g/dL   RDW 29.5  28.4 - 13.2 %   Platelets 221  150 - 400 K/uL  LIPASE, BLOOD      Component Value Range   Lipase 12  11 - 59 U/L  COMPREHENSIVE METABOLIC PANEL      Component Value Range   Sodium 140  135 - 145 mEq/L   Potassium 3.2 (*) 3.5 - 5.1 mEq/L   Chloride 103  96 - 112 mEq/L   CO2 24  19 - 32 mEq/L   Glucose, Bld 99  70 - 99 mg/dL   BUN 10  6 - 23 mg/dL   Creatinine, Ser 4.40  0.50 - 1.10 mg/dL   Calcium 9.0  8.4 - 10.2 mg/dL   Total Protein 6.8  6.0 - 8.3 g/dL    Albumin 3.7  3.5 - 5.2 g/dL   AST 12  0 - 37 U/L   ALT 8  0 - 35 U/L   Alkaline Phosphatase 85  39 - 117 U/L   Total Bilirubin 0.2 (*) 0.3 - 1.2 mg/dL   GFR calc non Af Amer >90  >90 mL/min  GFR calc Af Amer >90  >90 mL/min   Ct Abdomen Pelvis W Contrast  03/02/2012  *RADIOLOGY REPORT*  Clinical Data: Right-sided abdominal pain and history of recurrent colitis.  CT ABDOMEN AND PELVIS WITH CONTRAST  Technique:  Multidetector CT imaging of the abdomen and pelvis was performed following the standard protocol during bolus administration of intravenous contrast.  Contrast: OMNIPAQUE IOHEXOL 300 MG/ML  SOLN  Comparison: Abdominal films dated 02/14/2012 and multiple prior CT studies with the latest dated 01/30/2012.  Findings: Similar to the prior scan and previous CT findings, there is suggestion of wall thickening involving several segments of the colon.  The most prominent area currently is at the level of the cecum and ascending colon.  There also is some suggestion of wall thickening involving the lower descending colon, sigmoid colon and rectum.  This is not associated with significant surrounding inflammatory changes, evidence of perforation, bowel obstruction or focal abscess.  Small bowel remains unremarkable in appearance.  The appendix is normal and well visualized.  No free fluid identified in the peritoneal cavity.  No hernias or enlarged lymph nodes are seen.  The liver is unremarkable.  After prior cholecystectomy, there is stable dilatation of the distal common bile duct, measuring 11 mm in greatest caliber.  The spleen, pancreas, adrenal glands and kidneys are unremarkable.  There remains stable mild prominence of the pancreatic duct in the head and body of the pancreas without evidence of acute pancreatitis or focal pancreatic lesion.  The uterus, adnexal regions and bladder are unremarkable.  No bony abnormalities are identified.  IMPRESSION: Evidence of recurrent colonic wall  thickening.  It is difficult to tell how much of this is actually active colitis given appearance by CT.  The most prominent area of current wall thickening is at the level of the cecum, proximal ascending colon and at the level of the distal colon and rectum.  There is no associated obstruction, evidence of a perforation or focal abscess.   Original Report Authenticated By: Irish Lack, M.D.    Dg Abd 2 Views  02/14/2012  *RADIOLOGY REPORT*  Clinical Data: Recurrent colitis.  Right-sided abdominal pain.  ABDOMEN - 2 VIEW  Comparison: CT of the abdomen pelvis 01/30/2012.  Findings: A loop of bowel in the left abdomen demonstrates a fluid level is otherwise relatively featureless.  There is relative paucity of gas throughout the remainder of the abdomen.  Surgical clips are noted in the gallbladder fossa.  The axial skeleton is unremarkable.  IMPRESSION:  1.  Dilated loop of bowel in the left abdomen with a fluid level compatible with partial obstruction or ileus.  I favor the latter in association with the patient's known collagenous colitis. 2.  No free air or more proximal obstruction.   Original Report Authenticated By: Marin Roberts, M.D.       1. Colitis       MDM  46 year old female with abdominal pain.  Patient's pain has been managed with dilaudid and phenergan while in the ED.  I have discussed this patient with Dr. Rhunette Croft, who has also personally seen the patient.  I have reevaluated the patient several times, and her abdominal exam remains unchanged.  8:10 PM Dr. Rhunette Croft spoke with [facility], who recommends discharging the patient with cipro/flagyl.  They will call her next week for followup.  The patient has pain medicine at home.  The patient understands and agrees with the plan.  She is stable and ready for discharge.  Roxy Horseman, PA-C 03/02/12 2012

## 2012-03-02 NOTE — Telephone Encounter (Signed)
Called by [facility] MD.  patient presents tonight complaining of left lower quadrant abdominal pain, her second such presentation in the last 6 weeks. 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   M.D. tonight is requesting office followup in our office while she is awaiting her appointment in [facility] CT scan performed there tonight shows left-sided colitis. The patient is not having diarrhea. No report or rectal bleeding.  Majority she was hospitalized in October and seen by the GI consult service with the recommendation of tertiary care referral She is maintained on hydrocortisone for Addison's disease  Per history she is microscopic colitis, but no known history of IBD.  Her presentation tonight is atypical for IBD, infectious colitis or ischemic colitis. 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  My recommendation was for pain management as felt necessary by ER doctor, and we will attempt to get the patient a clinic visit while she awaits tertiary  Referral appt.  Arlyce Dice patient, Bonita Quin please see if she can be worked in to Dr. Marzetta Board or PA schedule)

## 2012-03-03 NOTE — ED Provider Notes (Signed)
Medical screening examination/treatment/procedure(s) were conducted as a shared visit with non-physician practitioner(s) and myself.  I personally evaluated the patient during the encounter  Pt with hx of microscopic colitis comes in with cc of abd pain. Pt has CT evidence of colitis. She has had a complicated course, and was admitted last month, where the [place] team thought she should be seen at a tertiary center. She has a [employer] in Jan. Currently, she has pain, but no dehydration. Spoke with the [employer] team, and the plan was made to give antibiotics, no steroids and have them see her in the clinic early next week....Marland Kitchen As it would be hard to establish [facility] appt before then.  Derwood Kaplan, MD 03/03/12 0005

## 2012-03-05 NOTE — Telephone Encounter (Signed)
Pt scheduled to see Dr. Arlyce Dice 03/06/12@2 :15pm. Left message for pt to call back regarding appt.

## 2012-03-05 NOTE — Telephone Encounter (Signed)
Left message for pt regarding the appt. Pt to call and let us know if she cannot keep the appt.

## 2012-03-06 ENCOUNTER — Ambulatory Visit: Payer: Self-pay | Admitting: Gastroenterology

## 2012-03-14 ENCOUNTER — Other Ambulatory Visit (HOSPITAL_COMMUNITY): Payer: Self-pay | Admitting: Psychiatry

## 2012-03-15 ENCOUNTER — Encounter (HOSPITAL_COMMUNITY): Payer: Self-pay | Admitting: *Deleted

## 2012-03-15 ENCOUNTER — Emergency Department (HOSPITAL_COMMUNITY): Payer: 59

## 2012-03-15 ENCOUNTER — Emergency Department (HOSPITAL_COMMUNITY)
Admission: EM | Admit: 2012-03-15 | Discharge: 2012-03-15 | Disposition: A | Discharge status: Home / Self Care | Payer: 59 | Attending: Emergency Medicine | Admitting: Emergency Medicine

## 2012-03-15 DIAGNOSIS — G8929 Other chronic pain: Secondary | ICD-10-CM | POA: Insufficient documentation

## 2012-03-15 DIAGNOSIS — R109 Unspecified abdominal pain: Secondary | ICD-10-CM

## 2012-03-15 DIAGNOSIS — E079 Disorder of thyroid, unspecified: Secondary | ICD-10-CM | POA: Insufficient documentation

## 2012-03-15 DIAGNOSIS — F17200 Nicotine dependence, unspecified, uncomplicated: Secondary | ICD-10-CM | POA: Insufficient documentation

## 2012-03-15 DIAGNOSIS — E2749 Other adrenocortical insufficiency: Secondary | ICD-10-CM | POA: Insufficient documentation

## 2012-03-15 DIAGNOSIS — Z79899 Other long term (current) drug therapy: Secondary | ICD-10-CM | POA: Insufficient documentation

## 2012-03-15 DIAGNOSIS — K589 Irritable bowel syndrome without diarrhea: Secondary | ICD-10-CM | POA: Insufficient documentation

## 2012-03-15 DIAGNOSIS — Z87442 Personal history of urinary calculi: Secondary | ICD-10-CM | POA: Insufficient documentation

## 2012-03-15 DIAGNOSIS — Z862 Personal history of diseases of the blood and blood-forming organs and certain disorders involving the immune mechanism: Secondary | ICD-10-CM | POA: Insufficient documentation

## 2012-03-15 DIAGNOSIS — I456 Pre-excitation syndrome: Secondary | ICD-10-CM | POA: Insufficient documentation

## 2012-03-15 DIAGNOSIS — Z8719 Personal history of other diseases of the digestive system: Secondary | ICD-10-CM | POA: Insufficient documentation

## 2012-03-15 DIAGNOSIS — R112 Nausea with vomiting, unspecified: Secondary | ICD-10-CM | POA: Insufficient documentation

## 2012-03-15 DIAGNOSIS — R197 Diarrhea, unspecified: Secondary | ICD-10-CM | POA: Insufficient documentation

## 2012-03-15 DIAGNOSIS — Z8659 Personal history of other mental and behavioral disorders: Secondary | ICD-10-CM | POA: Insufficient documentation

## 2012-03-15 LAB — COMPREHENSIVE METABOLIC PANEL
ALT: 11 U/L (ref 0–35)
AST: 13 U/L (ref 0–37)
Alkaline Phosphatase: 82 U/L (ref 39–117)
CO2: 24 mEq/L (ref 19–32)
Calcium: 9.1 mg/dL (ref 8.4–10.5)
Potassium: 3.3 mEq/L — ABNORMAL LOW (ref 3.5–5.1)
Sodium: 139 mEq/L (ref 135–145)
Total Protein: 6.8 g/dL (ref 6.0–8.3)

## 2012-03-15 LAB — CBC WITH DIFFERENTIAL/PLATELET
Basophils Absolute: 0 10*3/uL (ref 0.0–0.1)
Eosinophils Absolute: 0.1 10*3/uL (ref 0.0–0.7)
Eosinophils Relative: 2 % (ref 0–5)
Lymphocytes Relative: 24 % (ref 12–46)
MCV: 106 fL — ABNORMAL HIGH (ref 78.0–100.0)
Neutrophils Relative %: 69 % (ref 43–77)
Platelets: 301 10*3/uL (ref 150–400)
RBC: 3.49 MIL/uL — ABNORMAL LOW (ref 3.87–5.11)
RDW: 14.1 % (ref 11.5–15.5)
WBC: 8 10*3/uL (ref 4.0–10.5)

## 2012-03-15 LAB — URINALYSIS, ROUTINE W REFLEX MICROSCOPIC
Glucose, UA: NEGATIVE mg/dL
Hgb urine dipstick: NEGATIVE
Specific Gravity, Urine: 1.037 — ABNORMAL HIGH (ref 1.005–1.030)

## 2012-03-15 LAB — POCT PREGNANCY, URINE: Preg Test, Ur: NEGATIVE

## 2012-03-15 MED ORDER — LORAZEPAM 2 MG/ML IJ SOLN: 1.0000 mg | Freq: Once | INTRAMUSCULAR | Status: DC

## 2012-03-15 MED ORDER — PROMETHAZINE HCL 25 MG/ML IJ SOLN
25.0000 mg | Freq: Once | INTRAMUSCULAR | Status: DC
  Filled 2012-03-15: qty 1

## 2012-03-15 MED ORDER — SODIUM CHLORIDE 0.9 % IV BOLUS (SEPSIS)
1000.0000 mL | Freq: Once | INTRAVENOUS | Status: AC
  Administered 2012-03-15: 1000 mL via INTRAVENOUS

## 2012-03-15 MED ORDER — PROMETHAZINE HCL 25 MG/ML IJ SOLN
12.5000 mg | Freq: Once | INTRAMUSCULAR | Status: AC
  Administered 2012-03-15: 12.5 mg via INTRAVENOUS
  Filled 2012-03-15: qty 1

## 2012-03-15 MED ORDER — HYDROMORPHONE HCL PF 1 MG/ML IJ SOLN
1.0000 mg | Freq: Once | INTRAMUSCULAR | Status: AC
  Administered 2012-03-15: 1 mg via INTRAVENOUS
  Filled 2012-03-15: qty 1

## 2012-03-15 MED ORDER — PROMETHAZINE HCL 25 MG PO TABS: 25.0000 mg | ORAL_TABLET | Freq: Four times a day (QID) | ORAL | Status: DC | PRN

## 2012-03-15 MED ORDER — SODIUM CHLORIDE 0.9 % IV SOLN
INTRAVENOUS | Status: DC
  Administered 2012-03-15: 18:00:00 via INTRAVENOUS

## 2012-03-15 NOTE — ED Notes (Signed)
Pt from home with reports of right lower & epigastric abdominal pain and nausea. Pt endorses hx of same and reports that she has an appt with a GI MD at [facility] on 04/17/2012 but due to increased pain since Monday she came to ED. Pt denies emesis.

## 2012-03-15 NOTE — ED Notes (Signed)
Patient transported to X-ray 

## 2012-03-15 NOTE — ED Notes (Signed)
PA Geiple at bedside.  

## 2012-03-15 NOTE — ED Provider Notes (Signed)
History     CSN: [id number]  Arrival date & time 03/15/12  1358   First MD Initiated Contact with Patient 03/15/12 1443      Chief Complaint  Patient presents with  . Abdominal Pain    right lower & epigastric  . Nausea    (Consider location/radiation/quality/duration/timing/severity/associated sxs/prior treatment) HPI Comments: Patient presents with a three-day flare of her chronic abdominal pain. Patient states she has epigastric and right lower quadrant pain. This has been associated with nausea. She has a history of collagenous colitis. Patient states that typically with her pain she will have an increase in vomiting and diarrhea however this time she has had a decrease in diarrhea. She states that she has had a blockage in the past. She denies urinary symptoms, vaginal bleeding or discharge. Patient has been on her chronic medications including hydrocortisone until yesterday. She's been taking Vicodin at home with minimal relief. Patient has a gastroenterology followup at [facility] 04/17/12. On last visit, patient was given a course of Cipro and Flagyl. Symptoms seemed to improve for a time. Onset gradual. Course is constant. Nothing makes symptoms worse.  Patient is a 46 y.o. female presenting with abdominal pain. The history is provided by the patient and medical records.  Abdominal Pain The primary symptoms of the illness include abdominal pain, nausea, vomiting and diarrhea. The primary symptoms of the illness do not include fever or dysuria.    Past Medical History  Diagnosis Date  . Collagenous colitis   . WPW (Wolff-Parkinson-White syndrome)   . Kidney stone   . Addison disease   . Thyroid disease   . Clostridium difficile diarrhea   . Anxiety and depression   . Chronic diarrhea   . Chronic back pain   . Drug-seeking behavior   . Irritable bowel syndrome (IBS)   . Anemia   . Kidney stone   . Pancreatitis     Past Surgical History    Procedure Date  . Cholecystectomy   . Tubal ligation   . Lithotripsy   . Sphincterotomy   . Cardiac electrophysiology mapping and ablation     for WPW  . Video bronchoscopy 08/31/2011    Procedure: VIDEO BRONCHOSCOPY WITH FLUORO;  Surgeon: Storm Frisk, MD;  Location: [facility];  Service: Cardiopulmonary;  Laterality: N/A;    Family History  Problem Relation Age of Onset  . Prostate cancer Father   . Coronary artery disease Maternal Grandfather     History  Substance Use Topics  . Smoking status: Current Every Day Smoker -- 0.5 packs/day for 30 years    Types: Cigarettes  . Smokeless tobacco: Never Used  . Alcohol Use: Yes     Comment: seldom    OB History    Grav Para Term Preterm Abortions TAB SAB Ect Mult Living   3 3              Review of Systems  Constitutional: Negative for fever.  HENT: Negative for sore throat and rhinorrhea.   Eyes: Negative for redness.  Respiratory: Negative for cough.   Cardiovascular: Negative for chest pain.  Gastrointestinal: Positive for nausea, vomiting, abdominal pain and diarrhea. Negative for blood in stool.  Genitourinary: Negative for dysuria.  Musculoskeletal: Negative for myalgias.  Skin: Negative for rash.  Neurological: Negative for headaches.    Allergies  Ambien; Nsaids; and Zofran  Home Medications   Current Outpatient Rx  Name  Route  Sig  Dispense  Refill  .  BUDESONIDE 9 MG PO TB24   Oral   Take 9 mg by mouth daily. Prescription provided by [facility].         Marland Kitchen COLESTIPOL HCL 1 G PO TABS   Oral   Take 3 tablets (3 g total) by mouth 2 (two) times daily.   180 tablet   0   . DICYCLOMINE HCL 20 MG PO TABS   Oral   Take 20 mg by mouth 2 (two) times daily.         Marland Kitchen HYDROCODONE-ACETAMINOPHEN 5-325 MG PO TABS   Oral   Take 1 tablet by mouth every 6 (six) hours as needed. Pain         . HYDROCORTISONE 10 MG PO TABS   Oral   Take 10 mg by mouth daily.         Marland Kitchen LEVOTHYROXINE SODIUM 50  MCG PO TABS   Oral   Take 50 mcg by mouth daily.         Marland Kitchen LORAZEPAM 1 MG PO TABS   Oral   Take 1 mg by mouth every 8 (eight) hours as needed. anxiety         . ADULT MULTIVITAMIN W/MINERALS CH   Oral   Take 1 tablet by mouth daily.          Marland Kitchen NORTRIPTYLINE HCL 10 MG PO CAPS   Oral   Take 10 mg by mouth daily.          Marland Kitchen PANTOPRAZOLE SODIUM 40 MG PO TBEC   Oral   Take 1 tablet (40 mg total) by mouth daily.   30 tablet   1   . POTASSIUM CHLORIDE CRYS ER 20 MEQ PO TBCR   Oral   Take 20 mEq by mouth daily.         Marland Kitchen PROMETHAZINE HCL 25 MG PO TABS   Oral   Take 1 tablet (25 mg total) by mouth every 6 (six) hours as needed for nausea.   12 tablet   0   . VITAMIN D (ERGOCALCIFEROL) 50000 UNITS PO CAPS   Oral   Take 50,000 Units by mouth every 7 (seven) days. Take on monday           BP 154/110  Pulse 90  Temp 98.2 F (36.8 C) (Oral)  Resp 18  SpO2 100%  LMP 10/03/2011  Physical Exam  Nursing note and vitals reviewed. Constitutional: She appears well-developed and well-nourished.  HENT:  Head: Normocephalic and atraumatic.  Eyes: Conjunctivae normal are normal. Right eye exhibits no discharge. Left eye exhibits no discharge.  Neck: Normal range of motion. Neck supple.  Cardiovascular: Normal rate, regular rhythm and normal heart sounds.   Pulmonary/Chest: Effort normal and breath sounds normal.  Abdominal: Soft. Normal appearance. She exhibits no distension. Bowel sounds are decreased. There is generalized tenderness. There is no rigidity, no rebound, no guarding, no CVA tenderness, no tenderness at McBurney's point and negative Murphy's sign.    Neurological: She is alert.  Skin: Skin is warm and dry.  Psychiatric: She has a normal mood and affect.    ED Course  Procedures (including critical care time)  Labs Reviewed  CBC WITH DIFFERENTIAL - Abnormal; Notable for the following:    RBC 3.49 (*)     MCV 106.0 (*)     MCH 36.7 (*)     All  other components within normal limits  COMPREHENSIVE METABOLIC PANEL - Abnormal; Notable for the following:  Potassium 3.3 (*)     Total Bilirubin 0.2 (*)     All other components within normal limits  URINALYSIS, ROUTINE W REFLEX MICROSCOPIC - Abnormal; Notable for the following:    APPearance CLOUDY (*)     Specific Gravity, Urine 1.037 (*)     All other components within normal limits  LIPASE, BLOOD  POCT PREGNANCY, URINE   Dg Abd 2 Views  03/15/2012  *RADIOLOGY REPORT*  Clinical Data: Abdominal pain  ABDOMEN - 2 VIEW  Comparison: None.  Findings: Scattered large and small bowel gas is identified. Bilateral nonobstructing renal stones are again seen similar to that noted on prior CT examinations.  Abnormal mass or abnormal calcifications are seen.  To overlying dilation clips are seen. Findings of prior cholecystectomy are noted.  No free air is seen.  IMPRESSION: Nonobstructing renal calculi bilaterally.  No acute abnormality is noted.   Original Report Authenticated By: Alcide Clever, M.D.      1. Abdominal pain     2:55 PM Patient seen and examined. Previous records/CT reviewed. Work-up initiated. Medications ordered.   Vital signs reviewed and are as follows: Filed Vitals:   03/15/12 1417  BP: 154/110  Pulse: 90  Temp: 98.2 F (36.8 C)  Resp: 18   5:09 PM Labs reassuring other than UA with evidence of dehydration. Patient reports continued pain. Additional pain medicine ordered. I informed her of results to this point. Re-exam is unchanged.   7:07 PM Patient states she is ready for discharge home. She is reassured that her blood tests and x-rays are essentially normal.   Will give rx for phenergan at home. Patient has pain medications. Urged GI follow-up as planned.   The patient was urged to return to the Emergency Department immediately with worsening of current symptoms, worsening abdominal pain, persistent vomiting, blood noted in stools, fever, or any other concerns.  The patient verbalized understanding.     MDM  Patient with colitis, flare of his usual symptoms. Multiple CT scans in the past, not repeated today. X-ray does not show an obstruction. Labs are reassuring. Pain, dehydration, nausea treated in emergency department. Symptoms improved to a point where the patient is agreeable for discharge. She has GI followup. Appropriate discharge instructions given. She appears nontoxic.       Renne Crigler, [place] 03/15/12 1910

## 2012-03-15 NOTE — ED Notes (Signed)
Pt states she is still in pain. PA Geiple notified.

## 2012-03-15 NOTE — ED Provider Notes (Signed)
Medical screening examination/treatment/procedure(s) were performed by non-physician practitioner and as supervising physician I was immediately available for consultation/collaboration.   Richardean Canal, MD 03/15/12 2226

## 2012-03-22 ENCOUNTER — Telehealth (HOSPITAL_COMMUNITY): Payer: Self-pay | Admitting: Psychiatry

## 2012-03-22 DIAGNOSIS — F419 Anxiety disorder, unspecified: Secondary | ICD-10-CM | POA: Insufficient documentation

## 2012-03-22 DIAGNOSIS — F410 Panic disorder [episodic paroxysmal anxiety] without agoraphobia: Secondary | ICD-10-CM | POA: Insufficient documentation

## 2012-03-22 MED ORDER — SERTRALINE HCL 50 MG PO TABS: 50.0000 mg | ORAL_TABLET | Freq: Every day | ORAL | Status: DC

## 2012-03-22 NOTE — Telephone Encounter (Signed)
Fax request for sertraline 50 mg. The patient has not been seen since 10/2011 and no showed  Her follow up  Appointment. Called the pharmacy and she filled her sertraline on 01/04/2012, 01/31/2012, and 02/23/2012.  She was called and informed that she will need to make a return appointment.  PLAN:  1. As the patient has filled the medication, will fill another 30 days supply, no refills, and will have patient reschedule appointment within 30 days prior to another refill.  2. If patient will not return to clinic she can be discharged and referred back to her PCP or another mental health provider.

## 2012-04-02 ENCOUNTER — Encounter (HOSPITAL_COMMUNITY): Payer: Self-pay | Admitting: *Deleted

## 2012-04-02 ENCOUNTER — Emergency Department (HOSPITAL_COMMUNITY)
Admission: EM | Admit: 2012-04-02 | Discharge: 2012-04-02 | Disposition: A | Discharge status: Home / Self Care | Payer: 59 | Attending: Emergency Medicine | Admitting: Emergency Medicine

## 2012-04-02 DIAGNOSIS — I456 Pre-excitation syndrome: Secondary | ICD-10-CM | POA: Insufficient documentation

## 2012-04-02 DIAGNOSIS — G8929 Other chronic pain: Secondary | ICD-10-CM | POA: Insufficient documentation

## 2012-04-02 DIAGNOSIS — M549 Dorsalgia, unspecified: Secondary | ICD-10-CM | POA: Insufficient documentation

## 2012-04-02 DIAGNOSIS — Z9089 Acquired absence of other organs: Secondary | ICD-10-CM | POA: Insufficient documentation

## 2012-04-02 DIAGNOSIS — E079 Disorder of thyroid, unspecified: Secondary | ICD-10-CM | POA: Insufficient documentation

## 2012-04-02 DIAGNOSIS — R110 Nausea: Secondary | ICD-10-CM | POA: Insufficient documentation

## 2012-04-02 DIAGNOSIS — K589 Irritable bowel syndrome without diarrhea: Secondary | ICD-10-CM | POA: Insufficient documentation

## 2012-04-02 DIAGNOSIS — E2749 Other adrenocortical insufficiency: Secondary | ICD-10-CM | POA: Insufficient documentation

## 2012-04-02 DIAGNOSIS — F17200 Nicotine dependence, unspecified, uncomplicated: Secondary | ICD-10-CM | POA: Insufficient documentation

## 2012-04-02 DIAGNOSIS — Z79899 Other long term (current) drug therapy: Secondary | ICD-10-CM | POA: Insufficient documentation

## 2012-04-02 DIAGNOSIS — Z8619 Personal history of other infectious and parasitic diseases: Secondary | ICD-10-CM | POA: Insufficient documentation

## 2012-04-02 DIAGNOSIS — Z862 Personal history of diseases of the blood and blood-forming organs and certain disorders involving the immune mechanism: Secondary | ICD-10-CM | POA: Insufficient documentation

## 2012-04-02 DIAGNOSIS — IMO0002 Reserved for concepts with insufficient information to code with codable children: Secondary | ICD-10-CM | POA: Insufficient documentation

## 2012-04-02 DIAGNOSIS — Z8719 Personal history of other diseases of the digestive system: Secondary | ICD-10-CM | POA: Insufficient documentation

## 2012-04-02 DIAGNOSIS — F341 Dysthymic disorder: Secondary | ICD-10-CM | POA: Insufficient documentation

## 2012-04-02 DIAGNOSIS — E876 Hypokalemia: Secondary | ICD-10-CM

## 2012-04-02 DIAGNOSIS — Z87442 Personal history of urinary calculi: Secondary | ICD-10-CM | POA: Insufficient documentation

## 2012-04-02 DIAGNOSIS — R109 Unspecified abdominal pain: Secondary | ICD-10-CM

## 2012-04-02 LAB — CBC WITH DIFFERENTIAL/PLATELET
Basophils Absolute: 0 10*3/uL (ref 0.0–0.1)
HCT: 39 % (ref 36.0–46.0)
Hemoglobin: 13.7 g/dL (ref 12.0–15.0)
Lymphocytes Relative: 18 % (ref 12–46)
Lymphs Abs: 1.1 10*3/uL (ref 0.7–4.0)
Monocytes Absolute: 0.4 10*3/uL (ref 0.1–1.0)
Monocytes Relative: 6 % (ref 3–12)
Neutro Abs: 4.5 10*3/uL (ref 1.7–7.7)
RBC: 3.75 MIL/uL — ABNORMAL LOW (ref 3.87–5.11)
RDW: 13.5 % (ref 11.5–15.5)
WBC: 6.2 10*3/uL (ref 4.0–10.5)

## 2012-04-02 LAB — COMPREHENSIVE METABOLIC PANEL
AST: 13 U/L (ref 0–37)
CO2: 23 mEq/L (ref 19–32)
Chloride: 103 mEq/L (ref 96–112)
Creatinine, Ser: 0.64 mg/dL (ref 0.50–1.10)
GFR calc non Af Amer: >90 mL/min (ref >90)
Total Bilirubin: 0.2 mg/dL — ABNORMAL LOW (ref 0.3–1.2)

## 2012-04-02 MED ORDER — POTASSIUM CHLORIDE CRYS ER 20 MEQ PO TBCR
40.0000 meq | EXTENDED_RELEASE_TABLET | Freq: Once | ORAL | Status: AC
  Administered 2012-04-02: 40 meq via ORAL
  Filled 2012-04-02: qty 2

## 2012-04-02 MED ORDER — HYDROMORPHONE HCL PF 1 MG/ML IJ SOLN
1.0000 mg | Freq: Once | INTRAMUSCULAR | Status: AC
  Administered 2012-04-02: 1 mg via INTRAVENOUS
  Filled 2012-04-02: qty 1

## 2012-04-02 MED ORDER — POTASSIUM CHLORIDE ER 10 MEQ PO TBCR: 20.0000 meq | EXTENDED_RELEASE_TABLET | Freq: Two times a day (BID) | ORAL | Status: DC

## 2012-04-02 MED ORDER — POTASSIUM CHLORIDE 10 MEQ/100ML IV SOLN
10.0000 meq | Freq: Once | INTRAVENOUS | Status: AC
  Administered 2012-04-02: 10 meq via INTRAVENOUS
  Filled 2012-04-02: qty 100

## 2012-04-02 MED ORDER — OXYCODONE-ACETAMINOPHEN 5-325 MG PO TABS: 1.0000 | ORAL_TABLET | Freq: Four times a day (QID) | ORAL | Status: DC | PRN

## 2012-04-02 MED ORDER — SODIUM CHLORIDE 0.9 % IV SOLN
Freq: Once | INTRAVENOUS | Status: AC
  Administered 2012-04-02: 13:00:00 via INTRAVENOUS

## 2012-04-02 MED ORDER — GI COCKTAIL ~~LOC~~
30.0000 mL | Freq: Once | ORAL | Status: AC
  Administered 2012-04-02: 30 mL via ORAL
  Filled 2012-04-02: qty 30

## 2012-04-02 MED ORDER — PROMETHAZINE HCL 25 MG/ML IJ SOLN
12.5000 mg | Freq: Once | INTRAMUSCULAR | Status: AC
  Administered 2012-04-02: 12.5 mg via INTRAVENOUS
  Filled 2012-04-02: qty 1

## 2012-04-02 NOTE — ED Notes (Signed)
Critical K+ 2.7 called by Rosey Bath from lab.  [place] EDP notified

## 2012-04-02 NOTE — ED Provider Notes (Addendum)
History     CSN: [id number]  Arrival date & time 04/02/12  1209   First MD Initiated Contact with Patient 04/02/12 1241      No chief complaint on file.   (Consider location/radiation/quality/duration/timing/severity/associated sxs/prior treatment) HPI Comments: Patient with history of chronic right sided abdominal pain.  Has been seen here for this multiple times in the past.  No cause has been found with multiple ct scans and workups.  She has an appointment with GI at [facility] in two weeks.  When it gets worse, she is told to come here.  No fevers or chills.  No bloody stool.    Patient is a 46 y.o. female presenting with abdominal pain. The history is provided by the patient.  Abdominal Pain The primary symptoms of the illness include abdominal pain and nausea. The primary symptoms of the illness do not include fever, vomiting or dysuria. The onset of the illness was sudden. The problem has not changed since onset. The patient has not had a change in bowel habit. Symptoms associated with the illness do not include chills.    Past Medical History  Diagnosis Date  . Collagenous colitis   . WPW (Wolff-Parkinson-White syndrome)   . Kidney stone   . Addison disease   . Thyroid disease   . Clostridium difficile diarrhea   . Anxiety and depression   . Chronic diarrhea   . Chronic back pain   . Drug-seeking behavior   . Irritable bowel syndrome (IBS)   . Anemia   . Kidney stone   . Pancreatitis     Past Surgical History  Procedure Date  . Cholecystectomy   . Tubal ligation   . Lithotripsy   . Sphincterotomy   . Cardiac electrophysiology mapping and ablation     for WPW  . Video bronchoscopy 08/31/2011    Procedure: VIDEO BRONCHOSCOPY WITH FLUORO;  Surgeon: Storm Frisk, MD;  Location: [facility];  Service: Cardiopulmonary;  Laterality: N/A;    Family History  Problem Relation Age of Onset  . Prostate cancer Father   . Coronary artery disease Maternal  Grandfather     History  Substance Use Topics  . Smoking status: Current Every Day Smoker -- 0.5 packs/day for 30 years    Types: Cigarettes  . Smokeless tobacco: Never Used  . Alcohol Use: Yes     Comment: seldom    OB History    Grav Para Term Preterm Abortions TAB SAB Ect Mult Living   3 3              Review of Systems  Constitutional: Negative for fever and chills.  Gastrointestinal: Positive for nausea and abdominal pain. Negative for vomiting.  Genitourinary: Negative for dysuria.  All other systems reviewed and are negative.    Allergies  Ambien; Nsaids; and Zofran  Home Medications   Current Outpatient Rx  Name  Route  Sig  Dispense  Refill  . BUDESONIDE 9 MG PO TB24   Oral   Take 9 mg by mouth daily. Prescription provided by  GI.         Marland Kitchen COLESTIPOL HCL 1 G PO TABS   Oral   Take 3 tablets (3 g total) by mouth 2 (two) times daily.   180 tablet   0   . DICYCLOMINE HCL 20 MG PO TABS   Oral   Take 20 mg by mouth 2 (two) times daily.         Marland Kitchen  HYDROCODONE-ACETAMINOPHEN 5-325 MG PO TABS   Oral   Take 1 tablet by mouth every 6 (six) hours as needed. Pain         . HYDROCORTISONE 10 MG PO TABS   Oral   Take 10 mg by mouth daily.         Marland Kitchen LEVOTHYROXINE SODIUM 50 MCG PO TABS   Oral   Take 50 mcg by mouth daily.         Marland Kitchen LORAZEPAM 1 MG PO TABS   Oral   Take 1 mg by mouth every 8 (eight) hours as needed. anxiety         . ADULT MULTIVITAMIN W/MINERALS CH   Oral   Take 1 tablet by mouth daily.          Marland Kitchen NORTRIPTYLINE HCL 10 MG PO CAPS   Oral   Take 10 mg by mouth daily.          . OXYCODONE-ACETAMINOPHEN 5-325 MG PO TABS   Oral   Take 1 tablet by mouth every 4 (four) hours as needed. Pain         . PANTOPRAZOLE SODIUM 40 MG PO TBEC   Oral   Take 1 tablet (40 mg total) by mouth daily.   30 tablet   1   . POTASSIUM CHLORIDE CRYS ER 20 MEQ PO TBCR   Oral   Take 20 mEq by mouth daily.         Marland Kitchen PROMETHAZINE  HCL 25 MG PO TABS   Oral   Take 1 tablet (25 mg total) by mouth every 6 (six) hours as needed for nausea.   12 tablet   0   . SERTRALINE HCL 50 MG PO TABS   Oral   Take 1 tablet (50 mg total) by mouth daily.   30 tablet   0     Patient will need to schedule an appointment prior ...   . VITAMIN D (ERGOCALCIFEROL) 50000 UNITS PO CAPS   Oral   Take 50,000 Units by mouth every 7 (seven) days. Take on monday           BP 143/96  Pulse 115  Temp 97.9 F (36.6 C) (Oral)  Resp 20  SpO2 100%  Physical Exam  Nursing note and vitals reviewed. Constitutional: She is oriented to person, place, and time. She appears well-developed and well-nourished. No distress.  HENT:  Head: Normocephalic and atraumatic.  Neck: Normal range of motion. Neck supple.  Cardiovascular: Normal rate and regular rhythm.  Exam reveals no gallop and no friction rub.   No murmur heard. Pulmonary/Chest: Effort normal and breath sounds normal. No respiratory distress. She has no wheezes.  Abdominal: Soft. She exhibits no distension.       There is ttp in the right mid abdomen and epigastric area.  There is no rebound or guarding.  Bowel sounds are presents.    Musculoskeletal: Normal range of motion.  Neurological: She is alert and oriented to person, place, and time.  Skin: Skin is warm and dry. She is not diaphoretic.    ED Course  Procedures (including critical care time)   Labs Reviewed  CBC WITH DIFFERENTIAL  COMPREHENSIVE METABOLIC PANEL  LIPASE, BLOOD   No results found.   No diagnosis found.    MDM  The patient presents with right sided abd pain that is chronic.  She is awaiting follow up with GI at [facility].  She was given meds today and is feeling  better.  The workup does not reveal change in her labs with the exception of a low potassium.  This was replaced in the ED.  She has had multiple ct scans in the past few months and I do not believe another is indicated at this time.  She will  be discharged to home with potassium supplement.  The patient asked to see me again when the nurse attempted to discharge her.  She felt as though her discomfort was different this time and was concerned that something different was happening.  I discussed this with her and informed her that the only way to further evaluate this is to perform another ct scan.  (She has had many of these in the past several months.)  She refuses this as she does not want to go through another one.  She will go home and see her GI doctor as scheduled.            Geoffery Lyons, MD 04/02/12 1510  Geoffery Lyons, MD 04/02/12 [phone]

## 2012-04-02 NOTE — ED Notes (Signed)
Pt states she has a hx of colitis, was here about a week ago w/ R sided abdominal pain and upper abdominal pain, nausea, no vomiting, and diarrhea states it has never gone away, will get better and come back. Has an appt Jan 14th at [facility], states she was told there is nothing more they can do until then.

## 2012-04-02 NOTE — ED Notes (Signed)
Pt states she is not ready for discharge because pain is unchanged and pt states she feels like this pain is different from her pain with colitis. EDP notified and responded to bedside.

## 2012-04-25 ENCOUNTER — Encounter (HOSPITAL_BASED_OUTPATIENT_CLINIC_OR_DEPARTMENT_OTHER): Payer: Self-pay | Admitting: *Deleted

## 2012-04-25 ENCOUNTER — Emergency Department (HOSPITAL_BASED_OUTPATIENT_CLINIC_OR_DEPARTMENT_OTHER)
Admission: EM | Admit: 2012-04-25 | Discharge: 2012-04-25 | Disposition: A | Discharge status: Home / Self Care | Payer: BC Managed Care – PPO | Attending: Emergency Medicine | Admitting: Emergency Medicine

## 2012-04-25 DIAGNOSIS — F411 Generalized anxiety disorder: Secondary | ICD-10-CM | POA: Insufficient documentation

## 2012-04-25 DIAGNOSIS — Z862 Personal history of diseases of the blood and blood-forming organs and certain disorders involving the immune mechanism: Secondary | ICD-10-CM | POA: Insufficient documentation

## 2012-04-25 DIAGNOSIS — E079 Disorder of thyroid, unspecified: Secondary | ICD-10-CM | POA: Insufficient documentation

## 2012-04-25 DIAGNOSIS — Z8719 Personal history of other diseases of the digestive system: Secondary | ICD-10-CM | POA: Insufficient documentation

## 2012-04-25 DIAGNOSIS — Z8739 Personal history of other diseases of the musculoskeletal system and connective tissue: Secondary | ICD-10-CM | POA: Insufficient documentation

## 2012-04-25 DIAGNOSIS — F3289 Other specified depressive episodes: Secondary | ICD-10-CM | POA: Insufficient documentation

## 2012-04-25 DIAGNOSIS — Z8679 Personal history of other diseases of the circulatory system: Secondary | ICD-10-CM | POA: Insufficient documentation

## 2012-04-25 DIAGNOSIS — Z8619 Personal history of other infectious and parasitic diseases: Secondary | ICD-10-CM | POA: Insufficient documentation

## 2012-04-25 DIAGNOSIS — F17200 Nicotine dependence, unspecified, uncomplicated: Secondary | ICD-10-CM | POA: Insufficient documentation

## 2012-04-25 DIAGNOSIS — F329 Major depressive disorder, single episode, unspecified: Secondary | ICD-10-CM | POA: Insufficient documentation

## 2012-04-25 DIAGNOSIS — Z79899 Other long term (current) drug therapy: Secondary | ICD-10-CM | POA: Insufficient documentation

## 2012-04-25 DIAGNOSIS — Z8639 Personal history of other endocrine, nutritional and metabolic disease: Secondary | ICD-10-CM | POA: Insufficient documentation

## 2012-04-25 DIAGNOSIS — Z87440 Personal history of urinary (tract) infections: Secondary | ICD-10-CM | POA: Insufficient documentation

## 2012-04-25 DIAGNOSIS — R110 Nausea: Secondary | ICD-10-CM | POA: Insufficient documentation

## 2012-04-25 DIAGNOSIS — R197 Diarrhea, unspecified: Secondary | ICD-10-CM | POA: Insufficient documentation

## 2012-04-25 DIAGNOSIS — R109 Unspecified abdominal pain: Secondary | ICD-10-CM | POA: Insufficient documentation

## 2012-04-25 LAB — COMPREHENSIVE METABOLIC PANEL
ALT: 26 U/L (ref 0–35)
AST: 57 U/L — ABNORMAL HIGH (ref 0–37)
Albumin: 4.2 g/dL (ref 3.5–5.2)
Alkaline Phosphatase: 98 U/L (ref 39–117)
BUN: 12 mg/dL (ref 6–23)
CO2: 22 mEq/L (ref 19–32)
Calcium: 9.1 mg/dL (ref 8.4–10.5)
Chloride: 108 mEq/L (ref 96–112)
Creatinine, Ser: 0.7 mg/dL (ref 0.50–1.10)
GFR calc Af Amer: >90 mL/min (ref >90)
GFR calc non Af Amer: >90 mL/min (ref >90)
Glucose, Bld: 99 mg/dL (ref 70–99)
Potassium: 3.4 mEq/L — ABNORMAL LOW (ref 3.5–5.1)
Sodium: 142 mEq/L (ref 135–145)
Total Bilirubin: 0.5 mg/dL (ref 0.3–1.2)
Total Protein: 7 g/dL (ref 6.0–8.3)

## 2012-04-25 LAB — URINALYSIS, ROUTINE W REFLEX MICROSCOPIC
Glucose, UA: NEGATIVE mg/dL
Ketones, ur: 40 mg/dL — AB
Leukocytes, UA: NEGATIVE
Protein, ur: NEGATIVE mg/dL

## 2012-04-25 LAB — CBC WITH DIFFERENTIAL/PLATELET
Basophils Absolute: 0 10*3/uL (ref 0.0–0.1)
Eosinophils Relative: 1 % (ref 0–5)
HCT: 38.8 % (ref 36.0–46.0)
Hemoglobin: 13.1 g/dL (ref 12.0–15.0)
Lymphocytes Relative: 14 % (ref 12–46)
Lymphs Abs: 0.9 10*3/uL (ref 0.7–4.0)
MCV: 109.3 fL — ABNORMAL HIGH (ref 78.0–100.0)
Monocytes Absolute: 0.4 10*3/uL (ref 0.1–1.0)
Monocytes Relative: 7 % (ref 3–12)
Neutro Abs: 5.1 10*3/uL (ref 1.7–7.7)
RBC: 3.55 MIL/uL — ABNORMAL LOW (ref 3.87–5.11)
WBC: 6.5 10*3/uL (ref 4.0–10.5)

## 2012-04-25 LAB — URINE MICROSCOPIC-ADD ON

## 2012-04-25 LAB — LIPASE, BLOOD: Lipase: 15 U/L (ref 11–59)

## 2012-04-25 MED ORDER — HYDROMORPHONE HCL PF 1 MG/ML IJ SOLN
1.0000 mg | Freq: Once | INTRAMUSCULAR | Status: AC
  Administered 2012-04-25: 1 mg via INTRAVENOUS
  Filled 2012-04-25: qty 1

## 2012-04-25 MED ORDER — PROMETHAZINE HCL 25 MG PO TABS: 25.0000 mg | ORAL_TABLET | Freq: Four times a day (QID) | ORAL | Status: DC | PRN

## 2012-04-25 MED ORDER — PROMETHAZINE HCL 25 MG/ML IJ SOLN
12.5000 mg | Freq: Once | INTRAMUSCULAR | Status: AC
  Administered 2012-04-25: 12.5 mg via INTRAVENOUS
  Filled 2012-04-25: qty 1

## 2012-04-25 MED ORDER — ACETAMINOPHEN 325 MG PO TABS
650.0000 mg | ORAL_TABLET | Freq: Once | ORAL | Status: AC
  Administered 2012-04-25: 650 mg via ORAL
  Filled 2012-04-25: qty 2

## 2012-04-25 MED ORDER — SODIUM CHLORIDE 0.9 % IV SOLN
Freq: Once | INTRAVENOUS | Status: AC
  Administered 2012-04-25: 1000 mL via INTRAVENOUS

## 2012-04-25 MED ORDER — OXYCODONE-ACETAMINOPHEN 5-325 MG PO TABS: 1.0000 | ORAL_TABLET | Freq: Four times a day (QID) | ORAL | Status: DC | PRN

## 2012-04-25 NOTE — ED Notes (Signed)
Patient states she has colitis and has had heavy diarrhea for the last 3 days.  C/O llq abdominal pain.  States she woke up this morning with dizziness and lightheadedness.  C/O lower back pain after a fall on the ice last night. States she called her PCP at [facility] and was told to come in to have her potassium checked.

## 2012-04-25 NOTE — ED Provider Notes (Signed)
Medical screening examination/treatment/procedure(s) were conducted as a shared visit with non-physician practitioner(s) and myself.  I personally evaluated the patient during the encounter 3:07 PM  Pt has R flank pain, prior Hx stones, and UA showing 21-50 RBC's. She has had a number of abdominal CT's for her ulcerative colitis. Will treat presumptively for kidney stone with pain medication, Flomax, and have pt strain her urine. She should followup with her urologist if she does not get relief in a week's time.     Carleene Cooper III, MD 04/25/12 [phone]

## 2012-04-25 NOTE — Progress Notes (Signed)
3:07 PM Pt has R flank pain, prior Hx stones, and UA showing 21-50 RBC's.  She has had a number of abdominal CT's for her ulcerative colitis.  Will treat presumptively for kidney stone with pain medication, Flomax, and have pt strain her urine.  She should followup with her urologist if she does not get relief in a week's time.

## 2012-04-25 NOTE — ED Provider Notes (Signed)
History     CSN: [id number]  Arrival date & time 04/25/12  1100   First MD Initiated Contact with Patient 04/25/12 1230      Chief Complaint  Patient presents with  . Abdominal Pain    (Consider location/radiation/quality/duration/timing/severity/associated sxs/prior treatment) Patient is a 47 y.o. female presenting with abdominal pain and diarrhea. The history is provided by the patient. No language interpreter was used.  Abdominal Pain The primary symptoms of the illness include abdominal pain, nausea and diarrhea. Episode onset: 3 days. The problem has been gradually worsening.  Associated with: diarrhea. The patient states that she believes she is currently not pregnant. Risk factors: hx of colitis. Additional symptoms associated with the illness include back pain. Symptoms associated with the illness do not include frequency. Significant associated medical issues include inflammatory bowel disease.  Diarrhea The primary symptoms include abdominal pain, nausea and diarrhea. The illness began 3 to 5 days ago. The onset was gradual. The problem has been gradually worsening.  The illness is also significant for back pain. Significant associated medical issues include inflammatory bowel disease. Risk factors: hx of colitis.  Pt has had multiple episodes of diarrhea in the past 3 days.   Pt has a history of colitis.   Pt reports multi ct's and gi evaluations.   Pt scheduled to see another GI doctor at [facility] in March.    Past Medical History  Diagnosis Date  . Collagenous colitis   . WPW (Wolff-Parkinson-White syndrome)   . Kidney stone   . Addison disease   . Thyroid disease   . Clostridium difficile diarrhea   . Anxiety and depression   . Chronic diarrhea   . Chronic back pain   . Drug-seeking behavior   . Irritable bowel syndrome (IBS)   . Anemia   . Kidney stone   . Pancreatitis     Past Surgical History  Procedure Date  . Cholecystectomy   . Tubal ligation   .  Lithotripsy   . Sphincterotomy   . Cardiac electrophysiology mapping and ablation     for WPW  . Video bronchoscopy 08/31/2011    Procedure: VIDEO BRONCHOSCOPY WITH FLUORO;  Surgeon: Storm Frisk, MD;  Location: [facility];  Service: Cardiopulmonary;  Laterality: N/A;    Family History  Problem Relation Age of Onset  . Prostate cancer Father   . Coronary artery disease Maternal Grandfather     History  Substance Use Topics  . Smoking status: Current Every Day Smoker -- 0.5 packs/day for 30 years    Types: Cigarettes  . Smokeless tobacco: Never Used  . Alcohol Use: Yes     Comment: seldom    OB History    Grav Para Term Preterm Abortions TAB SAB Ect Mult Living   3 3              Review of Systems  Gastrointestinal: Positive for nausea, abdominal pain and diarrhea.  Genitourinary: Negative for frequency.  Musculoskeletal: Positive for back pain.  All other systems reviewed and are negative.    Allergies  Ambien; Nsaids; and Zofran  Home Medications   Current Outpatient Rx  Name  Route  Sig  Dispense  Refill  . BUDESONIDE 9 MG PO TB24   Oral   Take 9 mg by mouth daily. Prescription provided by [facility].         Marland Kitchen COLESTIPOL HCL 1 G PO TABS   Oral   Take 3 tablets (3 g total) by  mouth 2 (two) times daily.   180 tablet   0   . DICYCLOMINE HCL 20 MG PO TABS   Oral   Take 20 mg by mouth 2 (two) times daily.         Marland Kitchen HYDROCODONE-ACETAMINOPHEN 5-325 MG PO TABS   Oral   Take 1 tablet by mouth every 6 (six) hours as needed. Pain         . HYDROCORTISONE 10 MG PO TABS   Oral   Take 10 mg by mouth daily.         Marland Kitchen LEVOTHYROXINE SODIUM 50 MCG PO TABS   Oral   Take 50 mcg by mouth daily.         Marland Kitchen LORAZEPAM 1 MG PO TABS   Oral   Take 1 mg by mouth every 8 (eight) hours as needed. anxiety         . ADULT MULTIVITAMIN W/MINERALS CH   Oral   Take 1 tablet by mouth daily.          Marland Kitchen NORTRIPTYLINE HCL 10 MG PO CAPS   Oral   Take 10 mg  by mouth daily.          . OXYCODONE-ACETAMINOPHEN 5-325 MG PO TABS   Oral   Take 1 tablet by mouth every 4 (four) hours as needed. Pain         . OXYCODONE-ACETAMINOPHEN 5-325 MG PO TABS   Oral   Take 1-2 tablets by mouth every 6 (six) hours as needed for pain.   20 tablet   0   . PANTOPRAZOLE SODIUM 40 MG PO TBEC   Oral   Take 1 tablet (40 mg total) by mouth daily.   30 tablet   1   . POTASSIUM CHLORIDE ER 10 MEQ PO TBCR   Oral   Take 2 tablets (20 mEq total) by mouth 2 (two) times daily.   10 tablet   0   . POTASSIUM CHLORIDE CRYS ER 20 MEQ PO TBCR   Oral   Take 20 mEq by mouth daily.         Marland Kitchen PROMETHAZINE HCL 25 MG PO TABS   Oral   Take 1 tablet (25 mg total) by mouth every 6 (six) hours as needed for nausea.   12 tablet   0   . SERTRALINE HCL 50 MG PO TABS   Oral   Take 1 tablet (50 mg total) by mouth daily.   30 tablet   0     Patient will need to schedule an appointment prior ...   . VITAMIN D (ERGOCALCIFEROL) 50000 UNITS PO CAPS   Oral   Take 50,000 Units by mouth every 7 (seven) days. Take on monday           BP 110/86  Pulse 96  Temp 97.5 F (36.4 C) (Oral)  Resp 18  SpO2 100%  Physical Exam  Nursing note and vitals reviewed. Constitutional: She appears well-developed and well-nourished.  HENT:  Head: Normocephalic.  Right Ear: External ear normal.  Eyes: Conjunctivae normal are normal. Pupils are equal, round, and reactive to light.  Neck: Normal range of motion. Neck supple.  Cardiovascular: Normal rate and normal heart sounds.   Pulmonary/Chest: Effort normal and breath sounds normal.  Abdominal: Soft. Bowel sounds are normal.  Neurological: She is alert.  Skin: Skin is warm.  Psychiatric: She has a normal mood and affect.    ED Course  Procedures (including critical care time)  Labs Reviewed  URINALYSIS, ROUTINE W REFLEX MICROSCOPIC - Abnormal; Notable for the following:    APPearance CLOUDY (*)     Hgb urine dipstick  LARGE (*)     Ketones, ur 40 (*)     All other components within normal limits  URINE MICROSCOPIC-ADD ON - Abnormal; Notable for the following:    Bacteria, UA FEW (*)     All other components within normal limits  CBC WITH DIFFERENTIAL - Abnormal; Notable for the following:    RBC 3.55 (*)     MCV 109.3 (*)     MCH 36.9 (*)     Neutrophils Relative 78 (*)     All other components within normal limits  COMPREHENSIVE METABOLIC PANEL - Abnormal; Notable for the following:    Potassium 3.4 (*)     AST 57 (*)     All other components within normal limits  PREGNANCY, URINE  LIPASE, BLOOD   No results found.   1. Abdominal pain       MDM  Pt given Iv fluids x 1 liter,   Ua shows 21-50 rbc's.    Dr.  Ignacia Palma in to see and examine.   I advised pt I will treat pain and nausea.   Pt had 5 ct scans in 2013,  We will not repeat scan unless symptoms worsen.    Pt advised to see her MD for recheck in 24 hours        Lonia Skinner [place], [place] 04/25/12 1524  Lonia Skinner [place], [place] 04/25/12 1534  Lonia Skinner [place], [place] 04/25/12 1535  Lonia Skinner [place], [place] 04/25/12 1537

## 2012-05-12 ENCOUNTER — Other Ambulatory Visit (HOSPITAL_COMMUNITY): Payer: Self-pay | Admitting: Psychiatry

## 2012-05-21 ENCOUNTER — Other Ambulatory Visit (HOSPITAL_COMMUNITY): Payer: Self-pay | Admitting: Psychiatry

## 2012-05-22 ENCOUNTER — Emergency Department (HOSPITAL_BASED_OUTPATIENT_CLINIC_OR_DEPARTMENT_OTHER)
Admission: EM | Admit: 2012-05-22 | Discharge: 2012-05-22 | Disposition: A | Discharge status: Home / Self Care | Payer: 59 | Attending: Emergency Medicine | Admitting: Emergency Medicine

## 2012-05-22 ENCOUNTER — Encounter (HOSPITAL_BASED_OUTPATIENT_CLINIC_OR_DEPARTMENT_OTHER): Payer: Self-pay | Admitting: Emergency Medicine

## 2012-05-22 ENCOUNTER — Emergency Department (HOSPITAL_BASED_OUTPATIENT_CLINIC_OR_DEPARTMENT_OTHER): Payer: Self-pay

## 2012-05-22 DIAGNOSIS — F341 Dysthymic disorder: Secondary | ICD-10-CM | POA: Insufficient documentation

## 2012-05-22 DIAGNOSIS — E079 Disorder of thyroid, unspecified: Secondary | ICD-10-CM | POA: Insufficient documentation

## 2012-05-22 DIAGNOSIS — Z862 Personal history of diseases of the blood and blood-forming organs and certain disorders involving the immune mechanism: Secondary | ICD-10-CM | POA: Insufficient documentation

## 2012-05-22 DIAGNOSIS — Z8619 Personal history of other infectious and parasitic diseases: Secondary | ICD-10-CM | POA: Insufficient documentation

## 2012-05-22 DIAGNOSIS — Z8639 Personal history of other endocrine, nutritional and metabolic disease: Secondary | ICD-10-CM | POA: Insufficient documentation

## 2012-05-22 DIAGNOSIS — Z8719 Personal history of other diseases of the digestive system: Secondary | ICD-10-CM | POA: Insufficient documentation

## 2012-05-22 DIAGNOSIS — Z87442 Personal history of urinary calculi: Secondary | ICD-10-CM | POA: Insufficient documentation

## 2012-05-22 DIAGNOSIS — F17200 Nicotine dependence, unspecified, uncomplicated: Secondary | ICD-10-CM | POA: Insufficient documentation

## 2012-05-22 DIAGNOSIS — Z765 Malingerer [conscious simulation]: Secondary | ICD-10-CM | POA: Insufficient documentation

## 2012-05-22 DIAGNOSIS — IMO0002 Reserved for concepts with insufficient information to code with codable children: Secondary | ICD-10-CM | POA: Insufficient documentation

## 2012-05-22 DIAGNOSIS — Z3202 Encounter for pregnancy test, result negative: Secondary | ICD-10-CM | POA: Insufficient documentation

## 2012-05-22 DIAGNOSIS — R109 Unspecified abdominal pain: Secondary | ICD-10-CM | POA: Insufficient documentation

## 2012-05-22 DIAGNOSIS — G8929 Other chronic pain: Secondary | ICD-10-CM | POA: Insufficient documentation

## 2012-05-22 DIAGNOSIS — Z79899 Other long term (current) drug therapy: Secondary | ICD-10-CM | POA: Insufficient documentation

## 2012-05-22 DIAGNOSIS — I456 Pre-excitation syndrome: Secondary | ICD-10-CM | POA: Insufficient documentation

## 2012-05-22 LAB — URINALYSIS, ROUTINE W REFLEX MICROSCOPIC
Glucose, UA: NEGATIVE mg/dL
Hgb urine dipstick: NEGATIVE
Ketones, ur: NEGATIVE mg/dL
Leukocytes, UA: NEGATIVE
Protein, ur: NEGATIVE mg/dL
Urobilinogen, UA: 0.2 mg/dL (ref 0.0–1.0)

## 2012-05-22 MED ORDER — MORPHINE SULFATE 4 MG/ML IJ SOLN
4.0000 mg | Freq: Once | INTRAMUSCULAR | Status: AC
  Administered 2012-05-22: 4 mg via INTRAMUSCULAR
  Filled 2012-05-22: qty 1

## 2012-05-22 MED ORDER — OXYCODONE-ACETAMINOPHEN 5-325 MG PO TABS: 2.0000 | ORAL_TABLET | ORAL | Status: DC | PRN

## 2012-05-22 MED ORDER — MORPHINE SULFATE 4 MG/ML IJ SOLN
4.0000 mg | Freq: Once | INTRAMUSCULAR | Status: AC
  Administered 2012-05-22: 4 mg via INTRAVENOUS
  Filled 2012-05-22: qty 1

## 2012-05-22 NOTE — ED Notes (Signed)
Pt. Reports no changes in colitis and no problems with meds for colitis.  Pt. Is followed by [facility] for her colitis.

## 2012-05-22 NOTE — ED Provider Notes (Signed)
Medical screening examination/treatment/procedure(s) were performed by non-physician practitioner and as supervising physician I was immediately available for consultation/collaboration.   Gwyneth Sprout, MD 05/22/12 [phone]

## 2012-05-22 NOTE — ED Notes (Signed)
Pain rt flank  Onset last pm  States hx of kidney stone x 1 month ago

## 2012-05-22 NOTE — ED Provider Notes (Signed)
History     CSN: [id number]  Arrival date & time 05/22/12  1436   First MD Initiated Contact with Patient 05/22/12 1508      Chief Complaint  Patient presents with  . Flank Pain    (Consider location/radiation/quality/duration/timing/severity/associated sxs/prior treatment) HPI Comments: Pt states that she started having left flank pain yesterday:pt states that she has a history of stone and she has not follow up with with urology because resolved after she was seen here last month  Patient is a 47 y.o. female presenting with flank pain. The history is provided by the patient. No language interpreter was used.  Flank Pain This is a recurrent problem. The current episode started yesterday. The problem occurs constantly. The problem has been unchanged. Pertinent negatives include no fever, nausea or vomiting. Nothing aggravates the symptoms. She has tried nothing for the symptoms.    Past Medical History  Diagnosis Date  . Collagenous colitis   . WPW (Wolff-Parkinson-White syndrome)   . Kidney stone   . Addison disease   . Thyroid disease   . Clostridium difficile diarrhea   . Anxiety and depression   . Chronic diarrhea   . Chronic back pain   . Drug-seeking behavior   . Irritable bowel syndrome (IBS)   . Anemia   . Kidney stone   . Pancreatitis     Past Surgical History  Procedure Laterality Date  . Cholecystectomy    . Tubal ligation    . Lithotripsy    . Sphincterotomy    . Cardiac electrophysiology mapping and ablation      for WPW  . Video bronchoscopy  08/31/2011    Procedure: VIDEO BRONCHOSCOPY WITH FLUORO;  Surgeon: Storm Frisk, MD;  Location: [facility];  Service: Cardiopulmonary;  Laterality: N/A;    Family History  Problem Relation Age of Onset  . Prostate cancer Father   . Coronary artery disease Maternal Grandfather     History  Substance Use Topics  . Smoking status: Current Every Day Smoker -- 0.50 packs/day for 30 years    Types:  Cigarettes  . Smokeless tobacco: Never Used  . Alcohol Use: Yes     Comment: seldom    OB History   Grav Para Term Preterm Abortions TAB SAB Ect Mult Living   3 3              Review of Systems  Constitutional: Negative for fever.  Respiratory: Negative.   Cardiovascular: Negative.   Gastrointestinal: Negative for nausea and vomiting.  Genitourinary: Positive for flank pain.    Allergies  Ambien; Nsaids; and Zofran  Home Medications   Current Outpatient Rx  Name  Route  Sig  Dispense  Refill  . Budesonide 9 MG TB24   Oral   Take 9 mg by mouth daily. Prescription provided by [facility].         . colestipol (COLESTID) 1 G tablet   Oral   Take 3 tablets (3 g total) by mouth 2 (two) times daily.   180 tablet   0   . dicyclomine (BENTYL) 20 MG tablet   Oral   Take 20 mg by mouth 2 (two) times daily.         Marland Kitchen HYDROcodone-acetaminophen (NORCO/VICODIN) 5-325 MG per tablet   Oral   Take 1 tablet by mouth every 6 (six) hours as needed. Pain         . hydrocortisone (CORTEF) 10 MG tablet   Oral  Take 10 mg by mouth daily.         Marland Kitchen levothyroxine (SYNTHROID, LEVOTHROID) 50 MCG tablet   Oral   Take 50 mcg by mouth daily.         Marland Kitchen LORazepam (ATIVAN) 1 MG tablet   Oral   Take 1 mg by mouth every 8 (eight) hours as needed. anxiety         . Multiple Vitamin (MULITIVITAMIN WITH MINERALS) TABS   Oral   Take 1 tablet by mouth daily.          . nortriptyline (PAMELOR) 10 MG capsule   Oral   Take 10 mg by mouth daily.          Marland Kitchen oxyCODONE-acetaminophen (PERCOCET/ROXICET) 5-325 MG per tablet   Oral   Take 1 tablet by mouth every 4 (four) hours as needed. Pain         . oxyCODONE-acetaminophen (PERCOCET/ROXICET) 5-325 MG per tablet   Oral   Take 1-2 tablets by mouth every 6 (six) hours as needed for pain.   12 tablet   0   . pantoprazole (PROTONIX) 40 MG tablet   Oral   Take 1 tablet (40 mg total) by mouth daily.   30 tablet   1   .  potassium chloride (K-DUR) 10 MEQ tablet   Oral   Take 2 tablets (20 mEq total) by mouth 2 (two) times daily.   10 tablet   0   . potassium chloride SA (K-DUR,KLOR-CON) 20 MEQ tablet   Oral   Take 20 mEq by mouth daily.         . promethazine (PHENERGAN) 25 MG tablet   Oral   Take 1 tablet (25 mg total) by mouth every 6 (six) hours as needed for nausea.   12 tablet   0   . sertraline (ZOLOFT) 50 MG tablet   Oral   Take 1 tablet (50 mg total) by mouth daily.   30 tablet   0     Patient will need to schedule an appointment prior ...   . Vitamin D, Ergocalciferol, (DRISDOL) 50000 UNITS CAPS   Oral   Take 50,000 Units by mouth every 7 (seven) days. Take on monday           BP 138/91  Pulse 132  Temp(Src) 99.1 F (37.3 C) (Oral)  Resp 16  Ht 5\' 4"  (1.626 m)  Wt 133 lb (60.328 kg)  BMI 22.82 kg/m2  SpO2 100%  Physical Exam  Nursing note and vitals reviewed. Constitutional: She is oriented to person, place, and time. She appears well-developed and well-nourished.  HENT:  Head: Normocephalic and atraumatic.  Eyes: Conjunctivae and EOM are normal.  Neck: Neck supple.  Cardiovascular: Normal rate and regular rhythm.   Pulmonary/Chest: Effort normal and breath sounds normal.  Abdominal: Soft. Bowel sounds are normal. There is no tenderness.  Musculoskeletal: Normal range of motion.  Neurological: She is alert and oriented to person, place, and time. Coordination normal.  Skin: Skin is warm and dry.    ED Course  Procedures (including critical care time)  Labs Reviewed  URINALYSIS, ROUTINE W REFLEX MICROSCOPIC - Abnormal; Notable for the following:    APPearance CLOUDY (*)    All other components within normal limits  PREGNANCY, URINE   Dg Abd 1 View  05/22/2012  *RADIOLOGY REPORT*  Clinical Data: Right-sided abdominal pain.  ABDOMEN - 1 VIEW  Comparison: 03/15/2012  Findings: Prior cholecystectomy and bilateral tubal ligation. Calcifications  project over the  left kidney compatible with nephrolithiasis.  Nonobstructive bowel gas pattern.  No free air. No organomegaly or acute bony abnormality.  IMPRESSION: Left nephrolithiasis.  Prior cholecystectomy.  No acute findings.   Original Report Authenticated By: Charlett Nose, M.D.      1. Flank pain       MDM  Pt has kidney stone without obvious ureteral stone:pt can follow up with urology:no sign of infection noted to urine:abdomen is benign        Teressa Lower, NP 05/22/12 1623

## 2012-05-22 NOTE — ED Notes (Signed)
Per NP Pickering RN to give med IM not IV.

## 2012-05-24 ENCOUNTER — Emergency Department (HOSPITAL_COMMUNITY): Payer: BC Managed Care – PPO

## 2012-05-24 ENCOUNTER — Emergency Department (HOSPITAL_COMMUNITY)
Admission: EM | Admit: 2012-05-24 | Discharge: 2012-05-25 | Disposition: A | Discharge status: Home / Self Care | Payer: BC Managed Care – PPO | Attending: Emergency Medicine | Admitting: Emergency Medicine

## 2012-05-24 ENCOUNTER — Encounter (HOSPITAL_COMMUNITY): Payer: Self-pay | Admitting: Emergency Medicine

## 2012-05-24 DIAGNOSIS — E2749 Other adrenocortical insufficiency: Secondary | ICD-10-CM | POA: Insufficient documentation

## 2012-05-24 DIAGNOSIS — Z79899 Other long term (current) drug therapy: Secondary | ICD-10-CM | POA: Insufficient documentation

## 2012-05-24 DIAGNOSIS — Z862 Personal history of diseases of the blood and blood-forming organs and certain disorders involving the immune mechanism: Secondary | ICD-10-CM | POA: Insufficient documentation

## 2012-05-24 DIAGNOSIS — M549 Dorsalgia, unspecified: Secondary | ICD-10-CM | POA: Insufficient documentation

## 2012-05-24 DIAGNOSIS — K5289 Other specified noninfective gastroenteritis and colitis: Secondary | ICD-10-CM | POA: Insufficient documentation

## 2012-05-24 DIAGNOSIS — N200 Calculus of kidney: Secondary | ICD-10-CM | POA: Insufficient documentation

## 2012-05-24 DIAGNOSIS — N390 Urinary tract infection, site not specified: Secondary | ICD-10-CM | POA: Insufficient documentation

## 2012-05-24 DIAGNOSIS — K529 Noninfective gastroenteritis and colitis, unspecified: Secondary | ICD-10-CM

## 2012-05-24 DIAGNOSIS — Z8619 Personal history of other infectious and parasitic diseases: Secondary | ICD-10-CM | POA: Insufficient documentation

## 2012-05-24 DIAGNOSIS — G8929 Other chronic pain: Secondary | ICD-10-CM | POA: Insufficient documentation

## 2012-05-24 DIAGNOSIS — R109 Unspecified abdominal pain: Secondary | ICD-10-CM | POA: Insufficient documentation

## 2012-05-24 DIAGNOSIS — E079 Disorder of thyroid, unspecified: Secondary | ICD-10-CM | POA: Insufficient documentation

## 2012-05-24 DIAGNOSIS — F341 Dysthymic disorder: Secondary | ICD-10-CM | POA: Insufficient documentation

## 2012-05-24 DIAGNOSIS — K52831 Collagenous colitis: Secondary | ICD-10-CM

## 2012-05-24 DIAGNOSIS — F17200 Nicotine dependence, unspecified, uncomplicated: Secondary | ICD-10-CM | POA: Insufficient documentation

## 2012-05-24 DIAGNOSIS — Z87442 Personal history of urinary calculi: Secondary | ICD-10-CM | POA: Insufficient documentation

## 2012-05-24 DIAGNOSIS — F1910 Other psychoactive substance abuse, uncomplicated: Secondary | ICD-10-CM | POA: Insufficient documentation

## 2012-05-24 DIAGNOSIS — N23 Unspecified renal colic: Secondary | ICD-10-CM | POA: Insufficient documentation

## 2012-05-24 DIAGNOSIS — R197 Diarrhea, unspecified: Secondary | ICD-10-CM | POA: Insufficient documentation

## 2012-05-24 DIAGNOSIS — M940 Chondrocostal junction syndrome [Tietze]: Secondary | ICD-10-CM | POA: Insufficient documentation

## 2012-05-24 DIAGNOSIS — Z8719 Personal history of other diseases of the digestive system: Secondary | ICD-10-CM | POA: Insufficient documentation

## 2012-05-24 LAB — CBC
Hemoglobin: 13 g/dL (ref 12.0–15.0)
MCH: 37.5 pg — ABNORMAL HIGH (ref 26.0–34.0)
MCHC: 34.8 g/dL (ref 30.0–36.0)
MCV: 107.8 fL — ABNORMAL HIGH (ref 78.0–100.0)
RBC: 3.47 MIL/uL — ABNORMAL LOW (ref 3.87–5.11)

## 2012-05-24 LAB — BASIC METABOLIC PANEL
BUN: 9 mg/dL (ref 6–23)
CO2: 21 mEq/L (ref 19–32)
Calcium: 8.6 mg/dL (ref 8.4–10.5)
Creatinine, Ser: 0.65 mg/dL (ref 0.50–1.10)
Glucose, Bld: 89 mg/dL (ref 70–99)

## 2012-05-24 LAB — URINALYSIS, ROUTINE W REFLEX MICROSCOPIC
Bilirubin Urine: NEGATIVE
Ketones, ur: NEGATIVE mg/dL
Specific Gravity, Urine: 1.013 (ref 1.005–1.030)
pH: 6 (ref 5.0–8.0)

## 2012-05-24 LAB — HEPATIC FUNCTION PANEL
Alkaline Phosphatase: 106 U/L (ref 39–117)
Bilirubin, Direct: <0.1 mg/dL (ref 0.0–0.3)
Total Bilirubin: 0.2 mg/dL — ABNORMAL LOW (ref 0.3–1.2)

## 2012-05-24 LAB — URINE MICROSCOPIC-ADD ON

## 2012-05-24 LAB — LIPASE, BLOOD: Lipase: 24 U/L (ref 11–59)

## 2012-05-24 MED ORDER — PROMETHAZINE HCL 25 MG/ML IJ SOLN
25.0000 mg | Freq: Once | INTRAMUSCULAR | Status: AC
  Administered 2012-05-24: 25 mg via INTRAVENOUS
  Filled 2012-05-24: qty 1

## 2012-05-24 MED ORDER — HYDROMORPHONE HCL PF 1 MG/ML IJ SOLN
1.0000 mg | Freq: Once | INTRAMUSCULAR | Status: AC
  Administered 2012-05-25: 1 mg via INTRAVENOUS
  Filled 2012-05-24: qty 1

## 2012-05-24 MED ORDER — HYDROMORPHONE HCL PF 1 MG/ML IJ SOLN
1.0000 mg | Freq: Once | INTRAMUSCULAR | Status: AC
  Administered 2012-05-24: 1 mg via INTRAVENOUS
  Filled 2012-05-24: qty 1

## 2012-05-24 MED ORDER — METOCLOPRAMIDE HCL 5 MG/ML IJ SOLN
10.0000 mg | Freq: Once | INTRAMUSCULAR | Status: AC
  Administered 2012-05-25: 10 mg via INTRAVENOUS
  Filled 2012-05-24: qty 2

## 2012-05-24 NOTE — ED Provider Notes (Signed)
History     CSN: [id number]  Arrival date & time 05/24/12  2104   First MD Initiated Contact with Patient 05/24/12 2219      Chief Complaint  Patient presents with  . Abdominal Pain  . Diarrhea    (Consider location/radiation/quality/duration/timing/severity/associated sxs/prior treatment) Patient is a 47 y.o. female presenting with abdominal pain and diarrhea.  Abdominal Pain Associated symptoms: diarrhea   Associated symptoms: no chest pain, no nausea, no shortness of breath and no vomiting   Diarrhea Associated symptoms: abdominal pain   Associated symptoms: no headaches and no vomiting    patient has had severe abdominal pain for the last couple days. She has a history of chronic abdominal pain but states it does not normally feel like this. She's a history of collagenous colitis. She states she's chronic diarrhea but usually not this severe. The pain is on the right lower abdomen it goes through to the back. No dysuria. She was seen in ER on Sunday and was diagnosed possible kidney stone. No fevers. No nausea vomiting. No relief with her medication home. Pain is constant. She has been crying. He  Past Medical History  Diagnosis Date  . Collagenous colitis   . WPW (Wolff-Parkinson-White syndrome)   . Kidney stone   . Addison disease   . Thyroid disease   . Clostridium difficile diarrhea   . Anxiety and depression   . Chronic diarrhea   . Chronic back pain   . Drug-seeking behavior   . Irritable bowel syndrome (IBS)   . Anemia   . Kidney stone   . Pancreatitis     Past Surgical History  Procedure Laterality Date  . Cholecystectomy    . Tubal ligation    . Lithotripsy    . Sphincterotomy    . Cardiac electrophysiology mapping and ablation      for WPW  . Video bronchoscopy  08/31/2011    Procedure: VIDEO BRONCHOSCOPY WITH FLUORO;  Surgeon: Storm Frisk, MD;  Location: [facility];  Service: Cardiopulmonary;  Laterality: N/A;    Family History  Problem  Relation Age of Onset  . Prostate cancer Father   . Coronary artery disease Maternal Grandfather     History  Substance Use Topics  . Smoking status: Current Every Day Smoker -- 0.50 packs/day for 30 years    Types: Cigarettes  . Smokeless tobacco: Never Used  . Alcohol Use: Yes     Comment: seldom    OB History   Grav Para Term Preterm Abortions TAB SAB Ect Mult Living   3 3              Review of Systems  Constitutional: Negative for activity change and appetite change.  HENT: Negative for neck stiffness.   Eyes: Negative for pain.  Respiratory: Negative for chest tightness and shortness of breath.   Cardiovascular: Negative for chest pain and leg swelling.  Gastrointestinal: Positive for abdominal pain and diarrhea. Negative for nausea and vomiting.  Genitourinary: Negative for flank pain.  Musculoskeletal: Negative for back pain.  Skin: Negative for rash.  Neurological: Negative for weakness, numbness and headaches.  Psychiatric/Behavioral: Negative for behavioral problems.    Allergies  Ambien; Nsaids; and Zofran  Home Medications   Current Outpatient Rx  Name  Route  Sig  Dispense  Refill  . acetaminophen (TYLENOL) 325 MG tablet   Oral   Take 650 mg by mouth every 6 (six) hours as needed for pain.         Marland Kitchen  Budesonide 9 MG TB24   Oral   Take 9 mg by mouth daily. Prescription provided by  GI.         . colestipol (COLESTID) 1 G tablet   Oral   Take 3 tablets (3 g total) by mouth 2 (two) times daily.   180 tablet   0   . dicyclomine (BENTYL) 20 MG tablet   Oral   Take 20 mg by mouth 2 (two) times daily.         . hydrocortisone (CORTEF) 10 MG tablet   Oral   Take 10 mg by mouth daily.         Marland Kitchen levothyroxine (SYNTHROID, LEVOTHROID) 50 MCG tablet   Oral   Take 50 mcg by mouth daily.         Marland Kitchen LORazepam (ATIVAN) 1 MG tablet   Oral   Take 1 mg by mouth every 8 (eight) hours as needed. anxiety         . Multiple Vitamin  (MULITIVITAMIN WITH MINERALS) TABS   Oral   Take 1 tablet by mouth daily.          . nortriptyline (PAMELOR) 10 MG capsule   Oral   Take 10 mg by mouth at bedtime.          Marland Kitchen oxyCODONE-acetaminophen (PERCOCET/ROXICET) 5-325 MG per tablet   Oral   Take 2 tablets by mouth every 4 (four) hours as needed for pain.   6 tablet   0   . pantoprazole (PROTONIX) 40 MG tablet   Oral   Take 1 tablet (40 mg total) by mouth daily.   30 tablet   1   . potassium chloride (K-DUR) 10 MEQ tablet   Oral   Take 2 tablets (20 mEq total) by mouth 2 (two) times daily.   10 tablet   0   . promethazine (PHENERGAN) 25 MG tablet   Oral   Take 1 tablet (25 mg total) by mouth every 6 (six) hours as needed for nausea.   12 tablet   0   . Vitamin D, Ergocalciferol, (DRISDOL) 50000 UNITS CAPS   Oral   Take 50,000 Units by mouth every 7 (seven) days. Take on monday           BP 155/98  Pulse 99  Temp(Src) 98.3 F (36.8 C) (Oral)  Resp 20  SpO2 98%  Physical Exam  Nursing note and vitals reviewed. Constitutional: She is oriented to person, place, and time. She appears well-developed and well-nourished.  Patient is crying.  HENT:  Head: Normocephalic and atraumatic.  Eyes: EOM are normal. Pupils are equal, round, and reactive to light.  Neck: Normal range of motion. Neck supple.  Cardiovascular: Normal rate, regular rhythm and normal heart sounds.   No murmur heard. Pulmonary/Chest: Effort normal and breath sounds normal. No respiratory distress. She has no wheezes. She has no rales.  Abdominal: Soft. Bowel sounds are normal. She exhibits no distension. There is tenderness. There is no rebound and no guarding.  And mild right-sided abdominal tenderness. No rebound or guarding.  Genitourinary:  Patient with posterior flank tenderness on right  Musculoskeletal: Normal range of motion.  Neurological: She is alert and oriented to person, place, and time. No cranial nerve deficit.  Skin:  Skin is warm and dry.  Psychiatric: She has a normal mood and affect. Her speech is normal.    ED Course  Procedures (including critical care time)  Labs Reviewed  CBC - Abnormal;  Notable for the following:    RBC 3.47 (*)    MCV 107.8 (*)    MCH 37.5 (*)    All other components within normal limits  URINALYSIS, ROUTINE W REFLEX MICROSCOPIC  BASIC METABOLIC PANEL   No results found.   No diagnosis found.    MDM  Patient presents with abdominal/flank pain and diarrhea. History of same. She's had previous colitis and previous kidney stones. Urine was done 2 days ago and is reassuring. Now has 3-6 white cells and many bacteria. She also has hematuria. Lab work is otherwise reassuring. She's had numerous CTs in the past and I would like to avoid another one if possible. I will get a renal ultrasound to rule out hydronephrosis. She will get another dose of pain med. Care was turned over to Dr Rulon Abide.        Juliet Rude. Rubin Payor, MD 05/25/12 1610

## 2012-05-24 NOTE — ED Notes (Signed)
Pt is c/o abd pain that started on Tuesday and has gotten progressively worse  Pt states pain is mainly on the right side and in the epigastric area  Pt states she has colitis  Pt states she has had diarrhea but that is normal for her  Pt states she has had two episodes of vomiting in the past 24 hrs  Pt denies blood in her stool  Pt states her stool is liquid and yellow in color and looks like it has grainy stuff in it

## 2012-05-25 ENCOUNTER — Emergency Department (HOSPITAL_COMMUNITY): Payer: BC Managed Care – PPO

## 2012-05-25 MED ORDER — SODIUM CHLORIDE 0.9 % IV BOLUS (SEPSIS)
1000.0000 mL | Freq: Once | INTRAVENOUS | Status: AC
  Administered 2012-05-25: 1000 mL via INTRAVENOUS

## 2012-05-25 MED ORDER — HYDROMORPHONE HCL PF 1 MG/ML IJ SOLN
1.0000 mg | Freq: Once | INTRAMUSCULAR | Status: AC
  Administered 2012-05-25: 1 mg via INTRAVENOUS
  Filled 2012-05-25: qty 1

## 2012-05-25 MED ORDER — LEVOFLOXACIN 750 MG PO TABS: 750.0000 mg | ORAL_TABLET | Freq: Every day | ORAL | Status: DC

## 2012-05-25 MED ORDER — LEVOFLOXACIN 500 MG PO TABS
750.0000 mg | ORAL_TABLET | Freq: Every day | ORAL | Status: DC
  Administered 2012-05-25: 750 mg via ORAL
  Filled 2012-05-25: qty 2

## 2012-05-25 MED ORDER — PROMETHAZINE HCL 25 MG/ML IJ SOLN
25.0000 mg | Freq: Once | INTRAMUSCULAR | Status: AC
  Administered 2012-05-25: 25 mg via INTRAVENOUS
  Filled 2012-05-25: qty 1

## 2012-05-25 MED ORDER — PROMETHAZINE HCL 25 MG PO TABS: 25.0000 mg | ORAL_TABLET | Freq: Four times a day (QID) | ORAL | Status: DC | PRN

## 2012-05-25 MED ORDER — DEXTROSE 5 % IV SOLN: 1.0000 g | Freq: Once | INTRAVENOUS | Status: DC

## 2012-05-25 MED ORDER — OXYCODONE-ACETAMINOPHEN 5-325 MG PO TABS
2.0000 | ORAL_TABLET | Freq: Once | ORAL | Status: AC
  Administered 2012-05-25: 2 via ORAL
  Filled 2012-05-25: qty 2

## 2012-05-25 MED ORDER — OXYCODONE-ACETAMINOPHEN 5-325 MG PO TABS: 2.0000 | ORAL_TABLET | Freq: Four times a day (QID) | ORAL | Status: DC | PRN

## 2012-05-25 NOTE — ED Notes (Signed)
Patient transported to Ultrasound 

## 2012-05-26 LAB — URINE CULTURE: Colony Count: 25000

## 2012-05-27 ENCOUNTER — Encounter (HOSPITAL_COMMUNITY): Payer: Self-pay | Admitting: Emergency Medicine

## 2012-05-27 ENCOUNTER — Telehealth (HOSPITAL_COMMUNITY): Payer: Self-pay | Admitting: Emergency Medicine

## 2012-05-27 ENCOUNTER — Emergency Department (HOSPITAL_COMMUNITY): Payer: BC Managed Care – PPO

## 2012-05-27 ENCOUNTER — Emergency Department (HOSPITAL_COMMUNITY)
Admission: EM | Admit: 2012-05-27 | Discharge: 2012-05-28 | Disposition: A | Discharge status: Home / Self Care | Payer: BC Managed Care – PPO | Attending: Emergency Medicine | Admitting: Emergency Medicine

## 2012-05-27 DIAGNOSIS — Z862 Personal history of diseases of the blood and blood-forming organs and certain disorders involving the immune mechanism: Secondary | ICD-10-CM | POA: Insufficient documentation

## 2012-05-27 DIAGNOSIS — I456 Pre-excitation syndrome: Secondary | ICD-10-CM | POA: Insufficient documentation

## 2012-05-27 DIAGNOSIS — F17200 Nicotine dependence, unspecified, uncomplicated: Secondary | ICD-10-CM | POA: Insufficient documentation

## 2012-05-27 DIAGNOSIS — E2749 Other adrenocortical insufficiency: Secondary | ICD-10-CM | POA: Insufficient documentation

## 2012-05-27 DIAGNOSIS — A0472 Enterocolitis due to Clostridium difficile, not specified as recurrent: Secondary | ICD-10-CM | POA: Insufficient documentation

## 2012-05-27 DIAGNOSIS — R300 Dysuria: Secondary | ICD-10-CM | POA: Insufficient documentation

## 2012-05-27 DIAGNOSIS — K589 Irritable bowel syndrome without diarrhea: Secondary | ICD-10-CM | POA: Insufficient documentation

## 2012-05-27 DIAGNOSIS — F3289 Other specified depressive episodes: Secondary | ICD-10-CM | POA: Insufficient documentation

## 2012-05-27 DIAGNOSIS — R197 Diarrhea, unspecified: Secondary | ICD-10-CM | POA: Insufficient documentation

## 2012-05-27 DIAGNOSIS — N23 Unspecified renal colic: Secondary | ICD-10-CM | POA: Insufficient documentation

## 2012-05-27 DIAGNOSIS — E079 Disorder of thyroid, unspecified: Secondary | ICD-10-CM | POA: Insufficient documentation

## 2012-05-27 DIAGNOSIS — F411 Generalized anxiety disorder: Secondary | ICD-10-CM | POA: Insufficient documentation

## 2012-05-27 DIAGNOSIS — Z87442 Personal history of urinary calculi: Secondary | ICD-10-CM | POA: Insufficient documentation

## 2012-05-27 DIAGNOSIS — N390 Urinary tract infection, site not specified: Secondary | ICD-10-CM | POA: Insufficient documentation

## 2012-05-27 DIAGNOSIS — Z79899 Other long term (current) drug therapy: Secondary | ICD-10-CM | POA: Insufficient documentation

## 2012-05-27 DIAGNOSIS — G8929 Other chronic pain: Secondary | ICD-10-CM | POA: Insufficient documentation

## 2012-05-27 DIAGNOSIS — Z8719 Personal history of other diseases of the digestive system: Secondary | ICD-10-CM | POA: Insufficient documentation

## 2012-05-27 DIAGNOSIS — M940 Chondrocostal junction syndrome [Tietze]: Secondary | ICD-10-CM | POA: Insufficient documentation

## 2012-05-27 LAB — CBC WITH DIFFERENTIAL/PLATELET
Eosinophils Absolute: 0 10*3/uL (ref 0.0–0.7)
Hemoglobin: 12.1 g/dL (ref 12.0–15.0)
Lymphocytes Relative: 10 % — ABNORMAL LOW (ref 12–46)
Lymphs Abs: 0.8 10*3/uL (ref 0.7–4.0)
MCH: 37.5 pg — ABNORMAL HIGH (ref 26.0–34.0)
MCV: 107.7 fL — ABNORMAL HIGH (ref 78.0–100.0)
Monocytes Relative: 3 % (ref 3–12)
Neutrophils Relative %: 88 % — ABNORMAL HIGH (ref 43–77)
RBC: 3.23 MIL/uL — ABNORMAL LOW (ref 3.87–5.11)

## 2012-05-27 LAB — URINE MICROSCOPIC-ADD ON

## 2012-05-27 LAB — COMPREHENSIVE METABOLIC PANEL
AST: 17 U/L (ref 0–37)
Albumin: 3.7 g/dL (ref 3.5–5.2)
Calcium: 8.8 mg/dL (ref 8.4–10.5)
Creatinine, Ser: 0.54 mg/dL (ref 0.50–1.10)

## 2012-05-27 LAB — URINALYSIS, ROUTINE W REFLEX MICROSCOPIC
Ketones, ur: NEGATIVE mg/dL
Nitrite: NEGATIVE
Protein, ur: NEGATIVE mg/dL
Urobilinogen, UA: 0.2 mg/dL (ref 0.0–1.0)

## 2012-05-27 MED ORDER — HYDROMORPHONE HCL PF 1 MG/ML IJ SOLN
1.0000 mg | Freq: Once | INTRAMUSCULAR | Status: AC
  Administered 2012-05-27: 1 mg via INTRAVENOUS
  Filled 2012-05-27: qty 1

## 2012-05-27 MED ORDER — PROMETHAZINE HCL 25 MG/ML IJ SOLN
25.0000 mg | Freq: Once | INTRAMUSCULAR | Status: AC
  Administered 2012-05-27: 25 mg via INTRAVENOUS
  Filled 2012-05-27: qty 1

## 2012-05-27 MED ORDER — SODIUM CHLORIDE 0.9 % IV BOLUS (SEPSIS)
500.0000 mL | Freq: Once | INTRAVENOUS | Status: AC
  Administered 2012-05-27: 500 mL via INTRAVENOUS

## 2012-05-27 MED ORDER — ONDANSETRON HCL 4 MG/2ML IJ SOLN
4.0000 mg | Freq: Once | INTRAMUSCULAR | Status: DC
  Filled 2012-05-27: qty 2

## 2012-05-27 NOTE — ED Notes (Signed)
+  Urine. Patient treated with Levaquin. Sensitive to same. Per protocol MD. °

## 2012-05-27 NOTE — ED Notes (Signed)
Pt alert, arrives from home, c/o flank pain, upper epigastric pain, burning with urination, seen a few days ago, same c/o instructed to return with cont c/o, resp even unlabored, skin pwd

## 2012-05-27 NOTE — ED Provider Notes (Signed)
History     CSN: [id number]  Arrival date & time 05/27/12  1912   First MD Initiated Contact with Patient 05/27/12 2008      Chief Complaint  Patient presents with  . Flank Pain  . Dysuria    (Consider location/radiation/quality/duration/timing/severity/associated sxs/prior treatment) HPI Pt to the ED with complaints of  Flank pain, upper epigastric pain, burning on urination. She was seen here on 2/18 and 2/20. Abdominal Pain Associated symptoms: diarrhea  Associated symptoms: no chest pain, no nausea, no shortness of breath and no vomiting  Diarrhea Associated symptoms: abdominal pain  Associated symptoms: no headaches and no vomiting   She has severe abdominal pains that have been persisting over the last few days. Hx of chronic abdominal pains and it followed by [facility]. Her pmh is positive for cholecystectomy, tubal ligation, sphincterotomy. Her last CT scan was  In November of 2013 which showed colitis. She had a renal US down yesterday to evaluate for a stone and diagnosed with a renal. He has tried percocet and phenergan at home but it has not helped.     Past Medical History  Diagnosis Date  . Collagenous colitis   . WPW (Wolff-Parkinson-White syndrome)   . Kidney stone   . Addison disease   . Thyroid disease   . Clostridium difficile diarrhea   . Anxiety and depression   . Chronic diarrhea   . Chronic back pain   . Drug-seeking behavior   . Irritable bowel syndrome (IBS)   . Anemia   . Kidney stone   . Pancreatitis     Past Surgical History  Procedure Laterality Date  . Cholecystectomy    . Tubal ligation    . Lithotripsy    . Sphincterotomy    . Cardiac electrophysiology mapping and ablation      for WPW  . Video bronchoscopy  08/31/2011    Procedure: VIDEO BRONCHOSCOPY WITH FLUORO;  Surgeon: Storm Frisk, MD;  Location: [facility];  Service: Cardiopulmonary;  Laterality: N/A;    Family History  Problem Relation Age of Onset  .  Prostate cancer Father   . Coronary artery disease Maternal Grandfather     History  Substance Use Topics  . Smoking status: Current Every Day Smoker -- 0.50 packs/day for 30 years    Types: Cigarettes  . Smokeless tobacco: Never Used  . Alcohol Use: Yes     Comment: seldom    OB History   Grav Para Term Preterm Abortions TAB SAB Ect Mult Living   3 3              Review of Systems  Review of Systems  Gen: no weight loss, fevers, chills, night sweats  Eyes: no discharge or drainage, no occular pain or visual changes  Nose: no epistaxis or rhinorrhea  Mouth: no dental pain, no sore throat  Neck: no neck pain  Lungs:No wheezing, coughing or hemoptysis CV: no chest pain, palpitations, dependent edema or orthopnea  Abd: + abdominal pain, nausea, vomiting, flank pain GU: no dysuria or gross hematuria  MSK:  No abnormalities  Neuro: no headache, no focal neurologic deficits  Skin: no abnormalities Psyche: negative.   Allergies  Ambien; Nsaids; and Zofran  Home Medications   Current Outpatient Rx  Name  Route  Sig  Dispense  Refill  . Budesonide 9 MG TB24   Oral   Take 9 mg by mouth daily. Prescription provided by [facility].         Marland Kitchen  colestipol (COLESTID) 1 G tablet   Oral   Take 3 tablets (3 g total) by mouth 2 (two) times daily.   180 tablet   0   . dicyclomine (BENTYL) 20 MG tablet   Oral   Take 20 mg by mouth 2 (two) times daily.         . hydrocortisone (CORTEF) 10 MG tablet   Oral   Take 10 mg by mouth daily.         Marland Kitchen levofloxacin (LEVAQUIN) 750 MG tablet   Oral   Take 750 mg by mouth daily.         Marland Kitchen levothyroxine (SYNTHROID, LEVOTHROID) 50 MCG tablet   Oral   Take 50 mcg by mouth daily.         Marland Kitchen loperamide (IMODIUM) 2 MG capsule   Oral   Take 2 mg by mouth 4 (four) times daily as needed for diarrhea or loose stools.         Marland Kitchen LORazepam (ATIVAN) 1 MG tablet   Oral   Take 1 mg by mouth every 8 (eight) hours as needed.  anxiety         . Multiple Vitamin (MULITIVITAMIN WITH MINERALS) TABS   Oral   Take 1 tablet by mouth daily.          . nortriptyline (PAMELOR) 10 MG capsule   Oral   Take 10 mg by mouth at bedtime.          Marland Kitchen oxyCODONE-acetaminophen (PERCOCET/ROXICET) 5-325 MG per tablet   Oral   Take 2 tablets by mouth every 6 (six) hours as needed for pain.   31 tablet   0   . potassium chloride (K-DUR) 10 MEQ tablet   Oral   Take 2 tablets (20 mEq total) by mouth 2 (two) times daily.   10 tablet   0   . promethazine (PHENERGAN) 25 MG tablet   Oral   Take 25 mg by mouth every 6 (six) hours as needed for nausea.         . Vitamin D, Ergocalciferol, (DRISDOL) 50000 UNITS CAPS   Oral   Take 50,000 Units by mouth every Monday. Take on monday         . oxyCODONE-acetaminophen (PERCOCET/ROXICET) 5-325 MG per tablet   Oral   Take 1 tablet by mouth every 6 (six) hours as needed for pain.   10 tablet   0     BP 125/78  Pulse 76  Temp(Src) 97.8 F (36.6 C) (Oral)  Resp 18  SpO2 97%  Physical Exam  Nursing note and vitals reviewed. Constitutional: She appears well-developed and well-nourished. No distress.  HENT:  Head: Normocephalic and atraumatic.  Eyes: Pupils are equal, round, and reactive to light.  Neck: Normal range of motion. Neck supple.  Cardiovascular: Normal rate and regular rhythm.   Pulmonary/Chest: Effort normal.  Abdominal: Soft. There is tenderness (sternal/epigastric pain) in the epigastric area.    Neurological: She is alert.  Skin: Skin is warm and dry.    ED Course  Procedures (including critical care time)  Labs Reviewed  URINALYSIS, ROUTINE W REFLEX MICROSCOPIC - Abnormal; Notable for the following:    APPearance CLOUDY (*)    Hgb urine dipstick LARGE (*)    Leukocytes, UA TRACE (*)    All other components within normal limits  COMPREHENSIVE METABOLIC PANEL - Abnormal; Notable for the following:    Glucose, Bld 103 (*)    Total Bilirubin  0.2 (*)  All other components within normal limits  CBC WITH DIFFERENTIAL - Abnormal; Notable for the following:    RBC 3.23 (*)    HCT 34.8 (*)    MCV 107.7 (*)    MCH 37.5 (*)    Neutrophils Relative 88 (*)    Lymphocytes Relative 10 (*)    All other components within normal limits  URINE MICROSCOPIC-ADD ON - Abnormal; Notable for the following:    Squamous Epithelial / LPF MANY (*)    Bacteria, UA FEW (*)    All other components within normal limits  LIPASE, BLOOD   Dg Abd 2 Views  05/27/2012  *RADIOLOGY REPORT*  Clinical Data: 47 year old female with abdominal pain.  ABDOMEN - 2 VIEW  Comparison: 05/24/2012  Findings: Small amount of fluid, gas and stool in the colon noted. No dilated small bowel loops are present. There is no evidence of bowel obstruction or pneumoperitoneum. Left renal calculi are again noted. Cholecystectomy and tubal ligation clips again noted. No acute bony abnormalities are present.  IMPRESSION: Fluid and stool within the colon - no evidence of bowel obstruction or pneumoperitoneum.   Original Report Authenticated By: Harmon Pier, M.D.      1. UTI (lower urinary tract infection)   2. Renal colic   3. Addison's disease   4. Costochondritis       MDM   patient discussed with Dr. Rennis Chris. Levaquin is an appropriate medication to cover her Klebsiella pneumonia a UTI. The patient says she has a lot of pain however she does not meet any criteria for admission. We'll attempt to control her pain here in the emergency department.   2:10am- patients pain still difficult to control we'll give one or 2 more rounds of pain medicine and then discharge. She needs to contact her GI doctor at [facility] to be seen sooner than next month.   4:26am - patients pain is much better. She is still having pain but is ready to be discharged at this time.  Pt has been advised of the symptoms that warrant their return to the ED. Patient has voiced understanding and has agreed to  follow-up with the PCP or specialist.      Dorthula Matas, PA 05/28/12 [phone]

## 2012-05-27 NOTE — ED Provider Notes (Signed)
Plains of pain at sternum, nonradiating for the past 5 days worse with movement or changing position patient also suffers from rightt flank pain. She was seen here 05/26/2011 for same complaint. Started on Levaquin. Also prescribed Percocet for pain. She took only one Percocet today, she developed nausea and dry heaves. She denies fever. Flank pain is similar colitis or kidney infection she's had in the past. On exam patient is alert nontoxic appearing no distress lungs clear auscultation chest wall is exquisitely tender. Abdomen soft nontender. Sheet is exhibiting minimal flank tenderness. Medical decision making: Chest pain appears to be musculoskeletal in etiology. She is on appropriate antibiotic light of no fever no leukocytosis I don't feel she needs stress doses of steroids. Plan symptomatic care  Doug Sou, MD 05/27/12 [phone]

## 2012-05-27 NOTE — ED Notes (Signed)
WUJ:[id number]<JY> Expected date:05/27/12<BR> Expected time: 7:17 PM<BR> Means of arrival:Ambulance<BR> Comments:<BR> Hold 9 for chest pain

## 2012-05-27 NOTE — ED Notes (Signed)
Patient has +Urine culture. Checking to see if appropriately treated. °

## 2012-05-28 MED ORDER — HYDROMORPHONE HCL PF 1 MG/ML IJ SOLN
1.0000 mg | Freq: Once | INTRAMUSCULAR | Status: AC
  Administered 2012-05-28: 1 mg via INTRAVENOUS
  Filled 2012-05-28: qty 1

## 2012-05-28 MED ORDER — OXYCODONE-ACETAMINOPHEN 5-325 MG PO TABS
2.0000 | ORAL_TABLET | Freq: Once | ORAL | Status: AC
  Administered 2012-05-28: 2 via ORAL
  Filled 2012-05-28: qty 2

## 2012-05-28 MED ORDER — OXYCODONE-ACETAMINOPHEN 5-325 MG PO TABS: 1.0000 | ORAL_TABLET | Freq: Four times a day (QID) | ORAL | Status: DC | PRN

## 2012-05-28 MED ORDER — SODIUM CHLORIDE 0.9 % IV BOLUS (SEPSIS)
500.0000 mL | Freq: Once | INTRAVENOUS | Status: AC
  Administered 2012-05-28: 500 mL via INTRAVENOUS

## 2012-05-28 MED ORDER — PROMETHAZINE HCL 25 MG/ML IJ SOLN
25.0000 mg | Freq: Once | INTRAMUSCULAR | Status: AC
  Administered 2012-05-28: 25 mg via INTRAVENOUS
  Filled 2012-05-28: qty 1

## 2012-05-28 MED ORDER — MORPHINE SULFATE 4 MG/ML IJ SOLN
4.0000 mg | Freq: Once | INTRAMUSCULAR | Status: AC
  Administered 2012-05-28: 4 mg via INTRAVENOUS
  Filled 2012-05-28: qty 1

## 2012-06-01 ENCOUNTER — Emergency Department (HOSPITAL_BASED_OUTPATIENT_CLINIC_OR_DEPARTMENT_OTHER)
Admission: EM | Admit: 2012-06-01 | Discharge: 2012-06-01 | Disposition: A | Discharge status: Home / Self Care | Payer: BC Managed Care – PPO | Attending: Emergency Medicine | Admitting: Emergency Medicine

## 2012-06-01 ENCOUNTER — Encounter (HOSPITAL_BASED_OUTPATIENT_CLINIC_OR_DEPARTMENT_OTHER): Payer: Self-pay

## 2012-06-01 ENCOUNTER — Emergency Department (HOSPITAL_BASED_OUTPATIENT_CLINIC_OR_DEPARTMENT_OTHER): Payer: BC Managed Care – PPO

## 2012-06-01 DIAGNOSIS — G8929 Other chronic pain: Secondary | ICD-10-CM | POA: Insufficient documentation

## 2012-06-01 DIAGNOSIS — I456 Pre-excitation syndrome: Secondary | ICD-10-CM | POA: Insufficient documentation

## 2012-06-01 DIAGNOSIS — R1110 Vomiting, unspecified: Secondary | ICD-10-CM | POA: Insufficient documentation

## 2012-06-01 DIAGNOSIS — M549 Dorsalgia, unspecified: Secondary | ICD-10-CM | POA: Insufficient documentation

## 2012-06-01 DIAGNOSIS — F341 Dysthymic disorder: Secondary | ICD-10-CM | POA: Insufficient documentation

## 2012-06-01 DIAGNOSIS — R05 Cough: Secondary | ICD-10-CM | POA: Insufficient documentation

## 2012-06-01 DIAGNOSIS — Z8719 Personal history of other diseases of the digestive system: Secondary | ICD-10-CM | POA: Insufficient documentation

## 2012-06-01 DIAGNOSIS — R059 Cough, unspecified: Secondary | ICD-10-CM | POA: Insufficient documentation

## 2012-06-01 DIAGNOSIS — R109 Unspecified abdominal pain: Secondary | ICD-10-CM | POA: Insufficient documentation

## 2012-06-01 DIAGNOSIS — R079 Chest pain, unspecified: Secondary | ICD-10-CM | POA: Insufficient documentation

## 2012-06-01 DIAGNOSIS — R197 Diarrhea, unspecified: Secondary | ICD-10-CM | POA: Insufficient documentation

## 2012-06-01 DIAGNOSIS — E079 Disorder of thyroid, unspecified: Secondary | ICD-10-CM | POA: Insufficient documentation

## 2012-06-01 DIAGNOSIS — Z79899 Other long term (current) drug therapy: Secondary | ICD-10-CM | POA: Insufficient documentation

## 2012-06-01 DIAGNOSIS — Z87442 Personal history of urinary calculi: Secondary | ICD-10-CM | POA: Insufficient documentation

## 2012-06-01 DIAGNOSIS — F17200 Nicotine dependence, unspecified, uncomplicated: Secondary | ICD-10-CM | POA: Insufficient documentation

## 2012-06-01 LAB — CBC WITH DIFFERENTIAL/PLATELET
Eosinophils Absolute: 0.3 10*3/uL (ref 0.0–0.7)
Eosinophils Relative: 3 % (ref 0–5)
HCT: 39 % (ref 36.0–46.0)
Hemoglobin: 14.2 g/dL (ref 12.0–15.0)
Lymphocytes Relative: 26 % (ref 12–46)
Lymphs Abs: 3.4 10*3/uL (ref 0.7–4.0)
MCH: 38.7 pg — ABNORMAL HIGH (ref 26.0–34.0)
MCV: 106.3 fL — ABNORMAL HIGH (ref 78.0–100.0)
Monocytes Relative: 7 % (ref 3–12)
RBC: 3.67 MIL/uL — ABNORMAL LOW (ref 3.87–5.11)
WBC: 13 10*3/uL — ABNORMAL HIGH (ref 4.0–10.5)

## 2012-06-01 LAB — URINE MICROSCOPIC-ADD ON

## 2012-06-01 LAB — URINALYSIS, ROUTINE W REFLEX MICROSCOPIC
Bilirubin Urine: NEGATIVE
Glucose, UA: NEGATIVE mg/dL
Specific Gravity, Urine: 1.018 (ref 1.005–1.030)
pH: 6 (ref 5.0–8.0)

## 2012-06-01 LAB — COMPREHENSIVE METABOLIC PANEL
ALT: 16 U/L (ref 0–35)
AST: 16 U/L (ref 0–37)
Albumin: 3.9 g/dL (ref 3.5–5.2)
CO2: 29 mEq/L (ref 19–32)
Calcium: 9.2 mg/dL (ref 8.4–10.5)
GFR calc non Af Amer: >90 mL/min (ref >90)
Sodium: 139 mEq/L (ref 135–145)
Total Protein: 6.9 g/dL (ref 6.0–8.3)

## 2012-06-01 LAB — LIPASE, BLOOD: Lipase: 14 U/L (ref 11–59)

## 2012-06-01 MED ORDER — SODIUM CHLORIDE 0.9 % IV BOLUS (SEPSIS)
1000.0000 mL | Freq: Once | INTRAVENOUS | Status: AC
  Administered 2012-06-01: 1000 mL via INTRAVENOUS

## 2012-06-01 MED ORDER — HYDROCORTISONE SOD SUCCINATE 100 MG IJ SOLR
100.0000 mg | Freq: Once | INTRAMUSCULAR | Status: AC
  Administered 2012-06-01: 100 mg via INTRAVENOUS
  Filled 2012-06-01: qty 2

## 2012-06-01 MED ORDER — POTASSIUM CHLORIDE CRYS ER 20 MEQ PO TBCR
40.0000 meq | EXTENDED_RELEASE_TABLET | Freq: Once | ORAL | Status: AC
  Administered 2012-06-01: 40 meq via ORAL
  Filled 2012-06-01: qty 2

## 2012-06-01 MED ORDER — ONDANSETRON HCL 4 MG/2ML IJ SOLN: 4.0000 mg | Freq: Once | INTRAMUSCULAR | Status: DC

## 2012-06-01 MED ORDER — METOCLOPRAMIDE HCL 5 MG/ML IJ SOLN
10.0000 mg | Freq: Once | INTRAMUSCULAR | Status: AC
  Administered 2012-06-01: 10 mg via INTRAVENOUS
  Filled 2012-06-01: qty 2

## 2012-06-01 MED ORDER — DICYCLOMINE HCL 10 MG/ML IM SOLN
20.0000 mg | Freq: Once | INTRAMUSCULAR | Status: AC
  Administered 2012-06-01: 20 mg via INTRAMUSCULAR
  Filled 2012-06-01: qty 2

## 2012-06-01 MED ORDER — METOCLOPRAMIDE HCL 5 MG/ML IJ SOLN
5.0000 mg | Freq: Once | INTRAMUSCULAR | Status: AC
  Administered 2012-06-01: 5 mg via INTRAVENOUS
  Filled 2012-06-01: qty 2

## 2012-06-01 MED ORDER — PREDNISONE 10 MG PO TABS: 40.0000 mg | ORAL_TABLET | Freq: Every day | ORAL | Status: DC

## 2012-06-01 NOTE — ED Notes (Signed)
Pt was taken by Lequita Halt, EMT to Room 5 and asked to put on a gown, removing her clothing, warm blanket provided.

## 2012-06-01 NOTE — ED Notes (Signed)
MD at bedside, pt drinking ginger ale per MD order.

## 2012-06-01 NOTE — ED Notes (Signed)
Dawn Frost, RRT to perform EKG

## 2012-06-01 NOTE — ED Provider Notes (Signed)
History     CSN: [id number]  Arrival date & time 06/01/12  4098   First MD Initiated Contact with Patient 06/01/12 0710      Chief Complaint  Patient presents with  . Emesis    (Consider location/radiation/quality/duration/timing/severity/associated sxs/prior treatment) Patient is a 47 y.o. female presenting with vomiting.  Emesis Associated symptoms: abdominal pain    Complains of vomiting and diarrhea onset last night. Patient reports 12 episodes of vomiting in the past 12 hours and 14-17 episodes of diarrhea. Patient recently treated for urinary tract infection. She completed a seven-day course of antibiotics. Just complains of substernal chest pain anterior worse with moving typical for costochondritis she's had for the past 7 days, constant. Pain is worse with movement improved with remaining still. Patient has been prescribed Percocet for her chest pain previous visit, she states she did not get the prescription for Percocet filled. She is been taking hydrocodone with transient relief of chest pain but it worsens again each time she coughs .. denies fever denies shortness of breath. Admits to cough. Patient also complains of abdominal pain typical of pain she gets daily for a prolonged period crampy in nature Past Medical History  Diagnosis Date  . Collagenous colitis   . WPW (Wolff-Parkinson-White syndrome)   . Kidney stone   . Addison disease   . Thyroid disease   . Clostridium difficile diarrhea   . Anxiety and depression   . Chronic diarrhea   . Chronic back pain   . Drug-seeking behavior   . Irritable bowel syndrome (IBS)   . Anemia   . Kidney stone   . Pancreatitis     Past Surgical History  Procedure Laterality Date  . Cholecystectomy    . Tubal ligation    . Lithotripsy    . Sphincterotomy    . Cardiac electrophysiology mapping and ablation      for WPW  . Video bronchoscopy  08/31/2011    Procedure: VIDEO BRONCHOSCOPY WITH FLUORO;  Surgeon: Storm Frisk, MD;  Location: [facility];  Service: Cardiopulmonary;  Laterality: N/A;    Family History  Problem Relation Age of Onset  . Prostate cancer Father   . Coronary artery disease Maternal Grandfather     History  Substance Use Topics  . Smoking status: Current Every Day Smoker -- 0.50 packs/day for 30 years    Types: Cigarettes  . Smokeless tobacco: Never Used  . Alcohol Use: Yes     Comment: seldom    OB History   Grav Para Term Preterm Abortions TAB SAB Ect Mult Living   3 3              Review of Systems  Constitutional: Negative.   HENT: Negative.   Respiratory: Positive for cough.   Cardiovascular: Positive for chest pain.  Gastrointestinal: Positive for vomiting and abdominal pain.  Musculoskeletal: Negative.   Skin: Negative.   Neurological: Negative.   Psychiatric/Behavioral: Negative.   All other systems reviewed and are negative.    Allergies  Ambien; Nsaids; and Zofran  Home Medications   Current Outpatient Rx  Name  Route  Sig  Dispense  Refill  . Budesonide 9 MG TB24   Oral   Take 9 mg by mouth daily. Prescription provided by  GI.         . colestipol (COLESTID) 1 G tablet   Oral   Take 3 tablets (3 g total) by mouth 2 (two) times daily.   180 tablet  0   . dicyclomine (BENTYL) 20 MG tablet   Oral   Take 20 mg by mouth 2 (two) times daily.         . hydrocortisone (CORTEF) 10 MG tablet   Oral   Take 10 mg by mouth daily.         Marland Kitchen levofloxacin (LEVAQUIN) 750 MG tablet   Oral   Take 750 mg by mouth daily.         Marland Kitchen levothyroxine (SYNTHROID, LEVOTHROID) 50 MCG tablet   Oral   Take 50 mcg by mouth daily.         Marland Kitchen loperamide (IMODIUM) 2 MG capsule   Oral   Take 2 mg by mouth 4 (four) times daily as needed for diarrhea or loose stools.         Marland Kitchen LORazepam (ATIVAN) 1 MG tablet   Oral   Take 1 mg by mouth every 8 (eight) hours as needed. anxiety         . Multiple Vitamin (MULITIVITAMIN WITH MINERALS)  TABS   Oral   Take 1 tablet by mouth daily.          . nortriptyline (PAMELOR) 10 MG capsule   Oral   Take 10 mg by mouth at bedtime.          Marland Kitchen oxyCODONE-acetaminophen (PERCOCET/ROXICET) 5-325 MG per tablet   Oral   Take 2 tablets by mouth every 6 (six) hours as needed for pain.   31 tablet   0   . oxyCODONE-acetaminophen (PERCOCET/ROXICET) 5-325 MG per tablet   Oral   Take 1 tablet by mouth every 6 (six) hours as needed for pain.   10 tablet   0   . potassium chloride (K-DUR) 10 MEQ tablet   Oral   Take 2 tablets (20 mEq total) by mouth 2 (two) times daily.   10 tablet   0   . promethazine (PHENERGAN) 25 MG tablet   Oral   Take 25 mg by mouth every 6 (six) hours as needed for nausea.         . Vitamin D, Ergocalciferol, (DRISDOL) 50000 UNITS CAPS   Oral   Take 50,000 Units by mouth every Monday. Take on monday           BP 149/102  Pulse 116  Temp(Src) 98.1 F (36.7 C) (Oral)  Ht 5\' 4"  (1.626 m)  Wt 133 lb (60.328 kg)  BMI 22.82 kg/m2  SpO2 98%  Physical Exam  Nursing note and vitals reviewed. Constitutional: She appears well-developed and well-nourished. She appears distressed.  Alert Glasgow Coma Score 15 appears uncomfortable  HENT:  Head: Normocephalic and atraumatic.  Eyes: Conjunctivae are normal. Pupils are equal, round, and reactive to light.  Neck: Neck supple. No tracheal deviation present. No thyromegaly present.  Cardiovascular: Normal rate and regular rhythm.   No murmur heard. Pulmonary/Chest: Effort normal and breath sounds normal. She exhibits tenderness.  Tender over sternum reproducing pain exactly  Abdominal: Soft. Bowel sounds are normal. She exhibits no distension. There is no tenderness.  Musculoskeletal: Normal range of motion. She exhibits no edema and no tenderness.  Neurological: She is alert. Coordination normal.  Skin: Skin is warm and dry. No rash noted.  Psychiatric: She has a normal mood and affect.    ED Course   Procedures (including critical care time)  Labs Reviewed - No data to display No results found.   No diagnosis found.   8:25 AM feels improved the  nausea is improved but still requesting more medicine for nausea.  Date: 06/01/2012  Rate: 90  Rhythm: normal sinus rhythm  QRS Axis: normal  Intervals: normal  ST/T Wave abnormalities: normal  Conduction Disutrbances:none  Narrative Interpretation:   Old EKG Reviewed: unchanged from 12/07/11 interpreted by me Results for orders placed during the hospital encounter of 06/01/12  COMPREHENSIVE METABOLIC PANEL      Result Value Range   Sodium 139  135 - 145 mEq/L   Potassium 3.1 (*) 3.5 - 5.1 mEq/L   Chloride 99  96 - 112 mEq/L   CO2 29  19 - 32 mEq/L   Glucose, Bld 89  70 - 99 mg/dL   BUN 11  6 - 23 mg/dL   Creatinine, Ser 9.60  0.50 - 1.10 mg/dL   Calcium 9.2  8.4 - 45.4 mg/dL   Total Protein 6.9  6.0 - 8.3 g/dL   Albumin 3.9  3.5 - 5.2 g/dL   AST 16  0 - 37 U/L   ALT 16  0 - 35 U/L   Alkaline Phosphatase 82  39 - 117 U/L   Total Bilirubin 0.2 (*) 0.3 - 1.2 mg/dL   GFR calc non Af Amer >90  >90 mL/min   GFR calc Af Amer >90  >90 mL/min  URINALYSIS, ROUTINE W REFLEX MICROSCOPIC      Result Value Range   Color, Urine YELLOW  YELLOW   APPearance CLOUDY (*) CLEAR   Specific Gravity, Urine 1.018  1.005 - 1.030   pH 6.0  5.0 - 8.0   Glucose, UA NEGATIVE  NEGATIVE mg/dL   Hgb urine dipstick LARGE (*) NEGATIVE   Bilirubin Urine NEGATIVE  NEGATIVE   Ketones, ur NEGATIVE  NEGATIVE mg/dL   Protein, ur NEGATIVE  NEGATIVE mg/dL   Urobilinogen, UA 0.2  0.0 - 1.0 mg/dL   Nitrite NEGATIVE  NEGATIVE   Leukocytes, UA TRACE (*) NEGATIVE  CBC WITH DIFFERENTIAL      Result Value Range   WBC 13.0 (*) 4.0 - 10.5 K/uL   RBC 3.67 (*) 3.87 - 5.11 MIL/uL   Hemoglobin 14.2  12.0 - 15.0 g/dL   HCT 09.8  11.9 - 14.7 %   MCV 106.3 (*) 78.0 - 100.0 fL   MCH 38.7 (*) 26.0 - 34.0 pg   MCHC 36.4 (*) 30.0 - 36.0 g/dL   RDW 82.9  56.2 - 13.0 %    Platelets 250  150 - 400 K/uL   Neutrophils Relative 65  43 - 77 %   Neutro Abs 8.4 (*) 1.7 - 7.7 K/uL   Lymphocytes Relative 26  12 - 46 %   Lymphs Abs 3.4  0.7 - 4.0 K/uL   Monocytes Relative 7  3 - 12 %   Monocytes Absolute 0.9  0.1 - 1.0 K/uL   Eosinophils Relative 3  0 - 5 %   Eosinophils Absolute 0.3  0.0 - 0.7 K/uL   Basophils Relative 0  0 - 1 %   Basophils Absolute 0.0  0.0 - 0.1 K/uL  LIPASE, BLOOD      Result Value Range   Lipase 14  11 - 59 U/L  PREGNANCY, URINE      Result Value Range   Preg Test, Ur NEGATIVE  NEGATIVE  URINE MICROSCOPIC-ADD ON      Result Value Range   Squamous Epithelial / LPF FEW (*) RARE   WBC, UA 3-6  <3 WBC/hpf   RBC / HPF 21-50  <  3 RBC/hpf   Bacteria, UA MANY (*) RARE   Dg Chest 2 View  06/01/2012  *RADIOLOGY REPORT*  Clinical Data: Chest pain  CHEST - 2 VIEW  Comparison: May 24, 2012  Findings: Lungs clear.  Heart size and pulmonary vascularity are normal.  No adenopathy.  No bone lesions.  IMPRESSION: No abnormality noted.   Original Report Authenticated By: Bretta Bang, M.D.    Dg Abd 1 View  05/22/2012  *RADIOLOGY REPORT*  Clinical Data: Right-sided abdominal pain.  ABDOMEN - 1 VIEW  Comparison: 03/15/2012  Findings: Prior cholecystectomy and bilateral tubal ligation. Calcifications project over the left kidney compatible with nephrolithiasis.  Nonobstructive bowel gas pattern.  No free air. No organomegaly or acute bony abnormality.  IMPRESSION: Left nephrolithiasis.  Prior cholecystectomy.  No acute findings.   Original Report Authenticated By: Charlett Nose, M.D.    US Renal  05/25/2012  *RADIOLOGY REPORT*  Clinical Data: Abdominal pain  RENAL/URINARY TRACT ULTRASOUND COMPLETE  Comparison:  05/24/2012 radiograph, 03/02/2012 CT  Findings:  Right Kidney:  Measures 10/01 8 cm.  No hydronephrosis or focal abnormality.  Left Kidney:  Measures 10.6 cm.  There is mild pelvicaliectasis. No focal abnormality.  Bladder:  Decompressed   IMPRESSION: Mild left pelvicaliectasis.   Original Report Authenticated By: Jearld Lesch, M.D.    Dg Abd 2 Views  05/27/2012  *RADIOLOGY REPORT*  Clinical Data: 47 year old female with abdominal pain.  ABDOMEN - 2 VIEW  Comparison: 05/24/2012  Findings: Small amount of fluid, gas and stool in the colon noted. No dilated small bowel loops are present. There is no evidence of bowel obstruction or pneumoperitoneum. Left renal calculi are again noted. Cholecystectomy and tubal ligation clips again noted. No acute bony abnormalities are present.  IMPRESSION: Fluid and stool within the colon - no evidence of bowel obstruction or pneumoperitoneum.   Original Report Authenticated By: Harmon Pier, M.D.    Dg Abd Acute W/chest  05/24/2012  *RADIOLOGY REPORT*  Clinical Data: Abdominal pain  ACUTE ABDOMEN SERIES (ABDOMEN 2 VIEW & CHEST 1 VIEW)  Comparison: 05/23/2012  Findings: Lungs are clear.  Cardiomediastinal contours within normal range.  Surgical clips right upper quadrant. Calcific densities projecting over the expected location of the renal collecting system or proximal ureter.  Surgical clips within the pelvis.  No acute osseous finding.  IMPRESSION: Proximal urinary collecting system stones.  Nonobstructive bowel gas pattern.   Original Report Authenticated By: Jearld Lesch, M.D.      Chest x-ray reviewed by me.  9:45 AM feels improved radial home able to drink ginger ale without difficulty 9:15 AM patient feels improved after treatment with additional Reglan. Nausea has resolved. MDM  Case discussed with Dr.Marlotte, patient's PCP. Abdominal pain diarrhea or chronic.She has rx for phenergan which she has not filled. Pt can take imodium for diarrhea. Taking only 1-2 doses per day. innstructed can take up to 8 doses/day Has been seen by gastroenterologist, etiology unclear Patient has long-standing history of anxiety, and misuse of her medications. She seeing weekly in the office. she suggest not  to prescribe opioids. Will administer hydrocortisone 100 mg prior to discharge as stress dose of steroids and history of Addison's disease. And prednisone 40 mg daily for 5 days. She has an appointment with Dr.Marlotte scheduled for 05/11/2012. She can be seen sooner if needed  Diagnosis #1 nausea vomiting diarrhea #2 abdominal pain #3 chest wall pain #4 hypokalemia        Doug Sou, MD 06/01/12 [phone]

## 2012-06-01 NOTE — ED Notes (Signed)
Pt ambulated to restroom with iv pole without incident

## 2012-06-01 NOTE — ED Notes (Signed)
MD at bedside, asked this RN to have patient appropriately put on the gown.  Pt was again asked to remove clothing and put gown on so that only her underwear remained on.

## 2012-06-01 NOTE — ED Notes (Signed)
Pt states that she is vomiting about 12 times in the past 12 hours, and 14 episodes of diarrhea in the past 12 hours.  Pt states that pain from costochondritis is really bothering her with the vomiting episodes.

## 2012-06-01 NOTE — ED Notes (Signed)
EKG given to Dr. Jacubowitz. 

## 2012-06-01 NOTE — ED Notes (Signed)
MD at bedside. 

## 2012-06-01 NOTE — ED Provider Notes (Signed)
Medical screening examination/treatment/procedure(s) were conducted as a shared visit with non-physician practitioner(s) and myself.  I personally evaluated the patient during the encounter  Doug Sou, MD 06/01/12 [phone]

## 2012-06-03 LAB — URINE CULTURE

## 2012-06-04 NOTE — ED Notes (Signed)
+   Urine Chart sent to EDP office for review. 

## 2012-06-07 NOTE — ED Notes (Signed)
Already treated by PCP.

## 2012-06-07 NOTE — ED Notes (Signed)
Chart returned from EDP office .Start Cipro 500 mg BID x 7 days # 14 written by Roxy Horseman

## 2012-06-11 ENCOUNTER — Emergency Department (HOSPITAL_COMMUNITY)
Admission: EM | Admit: 2012-06-11 | Discharge: 2012-06-11 | Disposition: A | Discharge status: Home / Self Care | Payer: BC Managed Care – PPO | Attending: Emergency Medicine | Admitting: Emergency Medicine

## 2012-06-11 ENCOUNTER — Encounter (HOSPITAL_COMMUNITY): Payer: Self-pay | Admitting: Emergency Medicine

## 2012-06-11 DIAGNOSIS — E079 Disorder of thyroid, unspecified: Secondary | ICD-10-CM | POA: Insufficient documentation

## 2012-06-11 DIAGNOSIS — G8929 Other chronic pain: Secondary | ICD-10-CM | POA: Insufficient documentation

## 2012-06-11 DIAGNOSIS — M549 Dorsalgia, unspecified: Secondary | ICD-10-CM | POA: Insufficient documentation

## 2012-06-11 DIAGNOSIS — IMO0002 Reserved for concepts with insufficient information to code with codable children: Secondary | ICD-10-CM | POA: Insufficient documentation

## 2012-06-11 DIAGNOSIS — Z87442 Personal history of urinary calculi: Secondary | ICD-10-CM | POA: Insufficient documentation

## 2012-06-11 DIAGNOSIS — R197 Diarrhea, unspecified: Secondary | ICD-10-CM | POA: Insufficient documentation

## 2012-06-11 DIAGNOSIS — R110 Nausea: Secondary | ICD-10-CM | POA: Insufficient documentation

## 2012-06-11 DIAGNOSIS — F17200 Nicotine dependence, unspecified, uncomplicated: Secondary | ICD-10-CM | POA: Insufficient documentation

## 2012-06-11 DIAGNOSIS — Z79899 Other long term (current) drug therapy: Secondary | ICD-10-CM | POA: Insufficient documentation

## 2012-06-11 DIAGNOSIS — F411 Generalized anxiety disorder: Secondary | ICD-10-CM | POA: Insufficient documentation

## 2012-06-11 DIAGNOSIS — F1110 Opioid abuse, uncomplicated: Secondary | ICD-10-CM | POA: Insufficient documentation

## 2012-06-11 DIAGNOSIS — Z8679 Personal history of other diseases of the circulatory system: Secondary | ICD-10-CM | POA: Insufficient documentation

## 2012-06-11 DIAGNOSIS — Z8719 Personal history of other diseases of the digestive system: Secondary | ICD-10-CM | POA: Insufficient documentation

## 2012-06-11 DIAGNOSIS — M940 Chondrocostal junction syndrome [Tietze]: Secondary | ICD-10-CM | POA: Insufficient documentation

## 2012-06-11 DIAGNOSIS — R443 Hallucinations, unspecified: Secondary | ICD-10-CM | POA: Insufficient documentation

## 2012-06-11 DIAGNOSIS — F1123 Opioid dependence with withdrawal: Secondary | ICD-10-CM

## 2012-06-11 LAB — COMPREHENSIVE METABOLIC PANEL
ALT: 14 U/L (ref 0–35)
AST: 19 U/L (ref 0–37)
Alkaline Phosphatase: 81 U/L (ref 39–117)
CO2: 22 mEq/L (ref 19–32)
GFR calc Af Amer: >90 mL/min (ref >90)
GFR calc non Af Amer: >90 mL/min (ref >90)
Glucose, Bld: 97 mg/dL (ref 70–99)
Potassium: 3.7 mEq/L (ref 3.5–5.1)
Sodium: 138 mEq/L (ref 135–145)
Total Protein: 7.2 g/dL (ref 6.0–8.3)

## 2012-06-11 LAB — CBC
Hemoglobin: 13.4 g/dL (ref 12.0–15.0)
RBC: 3.65 MIL/uL — ABNORMAL LOW (ref 3.87–5.11)

## 2012-06-11 LAB — RAPID URINE DRUG SCREEN, HOSP PERFORMED
Amphetamines: NOT DETECTED
Barbiturates: NOT DETECTED
Cocaine: NOT DETECTED
Tetrahydrocannabinol: NOT DETECTED

## 2012-06-11 LAB — ACETAMINOPHEN LEVEL: Acetaminophen (Tylenol), Serum: <15 ug/mL (ref 10–30)

## 2012-06-11 MED ORDER — METOCLOPRAMIDE HCL 10 MG PO TABS
10.0000 mg | ORAL_TABLET | Freq: Once | ORAL | Status: AC
  Administered 2012-06-11: 10 mg via ORAL
  Filled 2012-06-11: qty 1

## 2012-06-11 MED ORDER — CLONIDINE HCL 0.1 MG PO TABS: 0.1000 mg | ORAL_TABLET | Freq: Three times a day (TID) | ORAL | Status: DC

## 2012-06-11 MED ORDER — PROMETHAZINE HCL 25 MG PO TABS: 25.0000 mg | ORAL_TABLET | Freq: Four times a day (QID) | ORAL | Status: DC | PRN

## 2012-06-11 NOTE — ED Notes (Signed)
Pt requesting med for anxiety, Britta Mccreedy, [place], informed, will monitor.

## 2012-06-11 NOTE — ED Notes (Signed)
Pt presenting to ed with c/o needing detox pt states she has been taking 10-12 Vicodin daily. Pt states she needs help. Pt states onset x 1-2 days. Pt denies SI/HI at this time.

## 2012-06-11 NOTE — ED Provider Notes (Signed)
History     CSN: [id number]  Arrival date & time 06/11/12  1245   First MD Initiated Contact with Patient 06/11/12 1327      Chief Complaint  Patient presents with  . detox    (Consider location/radiation/quality/duration/timing/severity/associated sxs/prior treatment) HPI Comments: 47 y.o. 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 Pt states she has been taking 12 vicodin (10/535) every day for a year and feels addiction is severe. Last use was Saturday where she took 2 pills because it was all she had left. She had been taking 12 daily up to that point.   She had a pain management contract with her PCP for chronic back pain and costochondritis. She broke the contract by obtaining Vicodin by other means and now PCP will not see her anymore. Is trying to establish new relationship with Julie [place], NP-C of [facility] .   Admits diaphoresis, nausea, diarrhea, leg cramps, and severe anxiety.   Denies SI/HI, auditory/visual hallucinations, vomiting, numbness, chest pain, shortness of breath.    Past Medical History  Diagnosis Date  . Collagenous colitis   . WPW (Wolff-Parkinson-White syndrome)   . Kidney stone   . Addison disease   . Thyroid disease   . Clostridium difficile diarrhea   . Anxiety and depression   . Chronic diarrhea   . Chronic back pain   . Drug-seeking behavior   . Irritable bowel syndrome (IBS)   . Anemia   . Kidney stone   . Pancreatitis     Past Surgical History  Procedure Laterality Date  . Cholecystectomy    . Tubal ligation    . Lithotripsy    . Sphincterotomy    . Cardiac electrophysiology mapping and ablation      for WPW  . Video bronchoscopy  08/31/2011    Procedure: VIDEO BRONCHOSCOPY WITH FLUORO;  Surgeon: Storm Frisk, MD;  Location: [facility];  Service: Cardiopulmonary;  Laterality: N/A;    Family History  Problem Relation Age of Onset  . Prostate cancer Father   . Coronary artery disease Maternal Grandfather      History  Substance Use Topics  . Smoking status: Current Every Day Smoker -- 0.50 packs/day for 30 years    Types: Cigarettes  . Smokeless tobacco: Never Used  . Alcohol Use: Yes     Comment: seldom    OB History   Grav Para Term Preterm Abortions TAB SAB Ect Mult Living   3 3              Review of Systems  Constitutional: Positive for diaphoresis. Negative for fever and chills.  HENT: Negative for neck pain and neck stiffness.   Eyes: Negative for visual disturbance.  Respiratory: Negative for apnea, chest tightness and shortness of breath.   Cardiovascular: Negative for chest pain and palpitations.  Gastrointestinal: Positive for nausea and diarrhea. Negative for vomiting, abdominal pain and constipation.  Genitourinary: Negative for dysuria and flank pain.  Musculoskeletal: Negative for gait problem.  Skin: Negative for rash.  Neurological: Negative for dizziness, weakness, light-headedness, numbness and headaches.  Psychiatric/Behavioral: The patient is nervous/anxious.     Allergies  Ambien; Nsaids; and Zofran  Home Medications   Current Outpatient Rx  Name  Route  Sig  Dispense  Refill  . Budesonide 9 MG TB24   Oral   Take 9 mg by mouth daily. Prescription provided by [facility].         . colestipol (COLESTID) 1 G tablet  Oral   Take 3 tablets (3 g total) by mouth 2 (two) times daily.   180 tablet   0   . dicyclomine (BENTYL) 20 MG tablet   Oral   Take 20 mg by mouth 2 (two) times daily.         . hydrocortisone (CORTEF) 10 MG tablet   Oral   Take 10 mg by mouth daily.         Marland Kitchen levofloxacin (LEVAQUIN) 750 MG tablet   Oral   Take 750 mg by mouth daily.         Marland Kitchen levothyroxine (SYNTHROID, LEVOTHROID) 50 MCG tablet   Oral   Take 50 mcg by mouth daily.         Marland Kitchen loperamide (IMODIUM) 2 MG capsule   Oral   Take 2 mg by mouth 4 (four) times daily as needed for diarrhea or loose stools.         Marland Kitchen LORazepam (ATIVAN) 1 MG tablet    Oral   Take 1 mg by mouth every 8 (eight) hours as needed. anxiety         . Multiple Vitamin (MULITIVITAMIN WITH MINERALS) TABS   Oral   Take 1 tablet by mouth daily.          . nortriptyline (PAMELOR) 10 MG capsule   Oral   Take 10 mg by mouth at bedtime.          Marland Kitchen oxyCODONE-acetaminophen (PERCOCET/ROXICET) 5-325 MG per tablet   Oral   Take 2 tablets by mouth every 6 (six) hours as needed for pain.   31 tablet   0   . oxyCODONE-acetaminophen (PERCOCET/ROXICET) 5-325 MG per tablet   Oral   Take 1 tablet by mouth every 6 (six) hours as needed for pain.   10 tablet   0   . potassium chloride (K-DUR) 10 MEQ tablet   Oral   Take 2 tablets (20 mEq total) by mouth 2 (two) times daily.   10 tablet   0   . predniSONE (DELTASONE) 10 MG tablet   Oral   Take 4 tablets (40 mg total) by mouth daily.   20 tablet   0   . promethazine (PHENERGAN) 25 MG tablet   Oral   Take 25 mg by mouth every 6 (six) hours as needed for nausea.         . Vitamin D, Ergocalciferol, (DRISDOL) 50000 UNITS CAPS   Oral   Take 50,000 Units by mouth every Monday. Take on monday           BP 160/108  Pulse 100  Temp(Src) 99 F (37.2 C) (Oral)  Resp 18  SpO2 100%  Physical Exam  Nursing note and vitals reviewed. Constitutional: She is oriented to person, place, and time. She appears well-developed and well-nourished. No distress.  HENT:  Head: Normocephalic and atraumatic.  Eyes: Conjunctivae and EOM are normal. Pupils are equal, round, and reactive to light.  Neck: Normal range of motion. Neck supple.  No meningeal signs  Cardiovascular: Normal rate, regular rhythm and normal heart sounds.  Exam reveals no gallop and no friction rub.   No murmur heard. Pulmonary/Chest: Effort normal and breath sounds normal. No respiratory distress. She has no wheezes. She has no rales. She exhibits no tenderness.  Abdominal: Soft. Bowel sounds are normal. She exhibits no distension. There is no  tenderness. There is no rebound and no guarding.  Genitourinary:  No CVA tenderness  Musculoskeletal: Normal range  of motion. She exhibits no edema and no tenderness.  5/5 strength throughout  Neurological: She is alert and oriented to person, place, and time. No cranial nerve deficit.  No focal deficits. Sensation to light touch intact.   Skin: Skin is warm and dry. She is not diaphoretic. No erythema.  Psychiatric:  Anxious, tearful    ED Course  Procedures (including critical care time)  Labs Reviewed  CBC - Abnormal; Notable for the following:    RBC 3.65 (*)    MCV 110.7 (*)    MCH 36.7 (*)    All other components within normal limits  URINE RAPID DRUG SCREEN (HOSP PERFORMED) - Abnormal; Notable for the following:    Opiates POSITIVE (*)    All other components within normal limits  ACETAMINOPHEN LEVEL  COMPREHENSIVE METABOLIC PANEL  ETHANOL  SALICYLATE LEVEL   No results found.   Diagnosis: opiate withdrawal, hypertension    MDM  Pt is anxious that withdrawing from vicodin on her own might be dangerous and wants help. The last time she took Vicodin was 2 pills (10/535) on Saturday. She has been feeling nauseous, sweaty, shaky, and having diarrhea.   Labs are back. Tested positive as expected for opiates. Remaining lab work is unconcerning. Pt is not seeking inpt help at this time. No SI/HI, auditory/visual hallucinations.   Pt has good support at home with husband and two children. Directed pt to follow up with her primary care. Prescribed phenergan for nausea, clonidine for anxiety, and provide resource list for behavioral health. Discussed importance of following up with a primary care doctor for her chronic medical conditions and to see if the hypertensive readings displayed today are indicative of new onset essential hypertension since HTN is not listed in her medical hx.    At this time there does not appear to be any evidence of an acute emergency medical  condition and the patient appears stable for discharge with appropriate outpatient follow up.Diagnosis was discussed with patient who verbalizes understanding and is agreeable to discharge.   Glade Nurse, PA-C 06/11/12 1535  [address] PA-C 06/11/12 1541  Glade Nurse, [place] 06/11/12 1615

## 2012-06-12 NOTE — ED Provider Notes (Signed)
Medical screening examination/treatment/procedure(s) were performed by non-physician practitioner and as supervising physician I was immediately available for consultation/collaboration.    Rejeana Fadness D Shayann Garbutt, MD 06/12/12 0700 

## 2012-07-10 ENCOUNTER — Emergency Department (HOSPITAL_BASED_OUTPATIENT_CLINIC_OR_DEPARTMENT_OTHER)
Admission: EM | Admit: 2012-07-10 | Discharge: 2012-07-10 | Disposition: A | Discharge status: Home / Self Care | Payer: BC Managed Care – PPO | Attending: Emergency Medicine | Admitting: Emergency Medicine

## 2012-07-10 ENCOUNTER — Encounter (HOSPITAL_BASED_OUTPATIENT_CLINIC_OR_DEPARTMENT_OTHER): Payer: Self-pay | Admitting: *Deleted

## 2012-07-10 DIAGNOSIS — R221 Localized swelling, mass and lump, neck: Secondary | ICD-10-CM | POA: Insufficient documentation

## 2012-07-10 DIAGNOSIS — T50905A Adverse effect of unspecified drugs, medicaments and biological substances, initial encounter: Secondary | ICD-10-CM

## 2012-07-10 DIAGNOSIS — T380X5A Adverse effect of glucocorticoids and synthetic analogues, initial encounter: Secondary | ICD-10-CM | POA: Insufficient documentation

## 2012-07-10 DIAGNOSIS — R259 Unspecified abnormal involuntary movements: Secondary | ICD-10-CM | POA: Insufficient documentation

## 2012-07-10 DIAGNOSIS — F17200 Nicotine dependence, unspecified, uncomplicated: Secondary | ICD-10-CM | POA: Insufficient documentation

## 2012-07-10 DIAGNOSIS — R220 Localized swelling, mass and lump, head: Secondary | ICD-10-CM | POA: Insufficient documentation

## 2012-07-10 DIAGNOSIS — F411 Generalized anxiety disorder: Secondary | ICD-10-CM | POA: Insufficient documentation

## 2012-07-10 DIAGNOSIS — Z79899 Other long term (current) drug therapy: Secondary | ICD-10-CM | POA: Insufficient documentation

## 2012-07-10 DIAGNOSIS — Z792 Long term (current) use of antibiotics: Secondary | ICD-10-CM | POA: Insufficient documentation

## 2012-07-10 MED ORDER — LORAZEPAM 1 MG PO TABS
1.0000 mg | ORAL_TABLET | Freq: Once | ORAL | Status: AC
  Administered 2012-07-10: 1 mg via ORAL
  Filled 2012-07-10: qty 1

## 2012-07-10 MED ORDER — DIPHENHYDRAMINE HCL 25 MG PO CAPS
25.0000 mg | ORAL_CAPSULE | Freq: Once | ORAL | Status: AC
  Administered 2012-07-10: 25 mg via ORAL
  Filled 2012-07-10: qty 1

## 2012-07-10 NOTE — ED Notes (Signed)
Pt c/o facial swelling and anxiety x 2 days , pt states she stopped all her meds last week

## 2012-07-10 NOTE — ED Provider Notes (Signed)
History     CSN: [id number]  Arrival date & time 07/10/12  1456   First MD Initiated Contact with Patient 07/10/12 1520      Chief Complaint  Patient presents with  . Facial Swelling  . Anxiety    (Consider location/radiation/quality/duration/timing/severity/associated sxs/prior treatment) Patient is a 47 y.o. female presenting with anxiety. The history is provided by the patient. No language interpreter was used.  Anxiety This is a new problem. The current episode started yesterday.  Pt is currently on prednisone for costochondritis.   Pt complains of feeling shakey.   Pt thought her face was swelling earlier today.   Pt feels like her swelling is going down.    Past Medical History  Diagnosis Date  . Collagenous colitis   . WPW (Wolff-Parkinson-White syndrome)   . Kidney stone   . Addison disease   . Thyroid disease   . Clostridium difficile diarrhea   . Anxiety and depression   . Chronic diarrhea   . Chronic back pain   . Drug-seeking behavior   . Irritable bowel syndrome (IBS)   . Anemia   . Kidney stone   . Pancreatitis     Past Surgical History  Procedure Laterality Date  . Cholecystectomy    . Tubal ligation    . Lithotripsy    . Sphincterotomy    . Cardiac electrophysiology mapping and ablation      for WPW  . Video bronchoscopy  08/31/2011    Procedure: VIDEO BRONCHOSCOPY WITH FLUORO;  Surgeon: Storm Frisk, MD;  Location: [facility];  Service: Cardiopulmonary;  Laterality: N/A;    Family History  Problem Relation Age of Onset  . Prostate cancer Father   . Coronary artery disease Maternal Grandfather     History  Substance Use Topics  . Smoking status: Current Every Day Smoker -- 0.50 packs/day for 30 years    Types: Cigarettes  . Smokeless tobacco: Never Used  . Alcohol Use: Yes     Comment: seldom    OB History   Grav Para Term Preterm Abortions TAB SAB Ect Mult Living   3 3              Review of Systems  HENT: Positive for  facial swelling.   All other systems reviewed and are negative.    Allergies  Ambien; Nsaids; and Zofran  Home Medications   Current Outpatient Rx  Name  Route  Sig  Dispense  Refill  . promethazine (PHENERGAN) 25 MG tablet   Oral   Take 1 tablet (25 mg total) by mouth every 6 (six) hours as needed for nausea.   12 tablet   0   . Budesonide 9 MG TB24   Oral   Take 9 mg by mouth daily. Prescription provided by [facility].         . cloNIDine (CATAPRES) 0.1 MG tablet   Oral   Take 1 tablet (0.1 mg total) by mouth 3 (three) times daily.   30 tablet   1   . colestipol (COLESTID) 1 G tablet   Oral   Take 3 tablets (3 g total) by mouth 2 (two) times daily.   180 tablet   0   . dicyclomine (BENTYL) 20 MG tablet   Oral   Take 20 mg by mouth 2 (two) times daily.         . hydrocortisone (CORTEF) 10 MG tablet   Oral   Take 10 mg by mouth daily.         Marland Kitchen  levofloxacin (LEVAQUIN) 750 MG tablet   Oral   Take 750 mg by mouth daily.         Marland Kitchen levothyroxine (SYNTHROID, LEVOTHROID) 50 MCG tablet   Oral   Take 50 mcg by mouth daily.         Marland Kitchen loperamide (IMODIUM) 2 MG capsule   Oral   Take 2 mg by mouth 4 (four) times daily as needed for diarrhea or loose stools.         Marland Kitchen LORazepam (ATIVAN) 1 MG tablet   Oral   Take 1 mg by mouth every 8 (eight) hours as needed. anxiety         . Multiple Vitamin (MULITIVITAMIN WITH MINERALS) TABS   Oral   Take 1 tablet by mouth daily.          . nortriptyline (PAMELOR) 10 MG capsule   Oral   Take 10 mg by mouth at bedtime.          Marland Kitchen oxyCODONE-acetaminophen (PERCOCET/ROXICET) 5-325 MG per tablet   Oral   Take 2 tablets by mouth every 6 (six) hours as needed for pain.   31 tablet   0   . oxyCODONE-acetaminophen (PERCOCET/ROXICET) 5-325 MG per tablet   Oral   Take 1 tablet by mouth every 6 (six) hours as needed for pain.   10 tablet   0   . potassium chloride (K-DUR) 10 MEQ tablet   Oral   Take 2 tablets  (20 mEq total) by mouth 2 (two) times daily.   10 tablet   0   . predniSONE (DELTASONE) 10 MG tablet   Oral   Take 4 tablets (40 mg total) by mouth daily.   20 tablet   0   . promethazine (PHENERGAN) 25 MG tablet   Oral   Take 25 mg by mouth every 6 (six) hours as needed for nausea.         . Vitamin D, Ergocalciferol, (DRISDOL) 50000 UNITS CAPS   Oral   Take 50,000 Units by mouth every Monday. Take on monday           BP 124/87  Pulse 88  Temp(Src) 98.8 F (37.1 C) (Oral)  Resp 16  Ht 5\' 4"  (1.626 m)  Wt 136 lb (61.689 kg)  BMI 23.33 kg/m2  SpO2 100%  Physical Exam  Vitals reviewed. Constitutional: She appears well-developed and well-nourished.  HENT:  Head: Normocephalic and atraumatic.  Right Ear: External ear normal.  Left Ear: External ear normal.  Nose: Nose normal.  Mouth/Throat: Oropharynx is clear and moist.  Eyes: Conjunctivae are normal. Pupils are equal, round, and reactive to light.  Neck: Normal range of motion. Neck supple.  Cardiovascular: Normal rate.   Pulmonary/Chest: Effort normal.  Abdominal: Soft.  Musculoskeletal: Normal range of motion.  Neurological: She is alert.  Skin: Skin is warm.  Psychiatric: She has a normal mood and affect.    ED Course  Procedures (including critical care time)  Labs Reviewed - No data to display No results found.   No diagnosis found.    MDM   Pt given benadryl and ativan.   I do not feel that pt is having allergic reaction,   Pt may have some anxiety from prednisone.   I advised pt to take benadryl for the next 24 hours.  Pt is advised to see her MD for recheck       Elson Areas, PA-C 07/10/12 1741

## 2012-07-11 NOTE — ED Provider Notes (Signed)
History/physical exam/procedure(s) were performed by non-physician practitioner and as supervising physician I was immediately available for consultation/collaboration. I have reviewed all notes and am in agreement with care and plan.   Hilario Quarry, MD 07/11/12 [phone]

## 2012-10-13 ENCOUNTER — Emergency Department (HOSPITAL_BASED_OUTPATIENT_CLINIC_OR_DEPARTMENT_OTHER): Payer: BC Managed Care – PPO

## 2012-10-13 ENCOUNTER — Encounter (HOSPITAL_BASED_OUTPATIENT_CLINIC_OR_DEPARTMENT_OTHER): Payer: Self-pay

## 2012-10-13 ENCOUNTER — Emergency Department (HOSPITAL_BASED_OUTPATIENT_CLINIC_OR_DEPARTMENT_OTHER)
Admission: EM | Admit: 2012-10-13 | Discharge: 2012-10-13 | Disposition: A | Discharge status: Home / Self Care | Payer: BC Managed Care – PPO | Attending: Emergency Medicine | Admitting: Emergency Medicine

## 2012-10-13 DIAGNOSIS — R638 Other symptoms and signs concerning food and fluid intake: Secondary | ICD-10-CM | POA: Insufficient documentation

## 2012-10-13 DIAGNOSIS — R112 Nausea with vomiting, unspecified: Secondary | ICD-10-CM

## 2012-10-13 DIAGNOSIS — Z3202 Encounter for pregnancy test, result negative: Secondary | ICD-10-CM | POA: Insufficient documentation

## 2012-10-13 DIAGNOSIS — R509 Fever, unspecified: Secondary | ICD-10-CM | POA: Insufficient documentation

## 2012-10-13 DIAGNOSIS — K589 Irritable bowel syndrome without diarrhea: Secondary | ICD-10-CM | POA: Insufficient documentation

## 2012-10-13 DIAGNOSIS — Z862 Personal history of diseases of the blood and blood-forming organs and certain disorders involving the immune mechanism: Secondary | ICD-10-CM | POA: Insufficient documentation

## 2012-10-13 DIAGNOSIS — E079 Disorder of thyroid, unspecified: Secondary | ICD-10-CM | POA: Insufficient documentation

## 2012-10-13 DIAGNOSIS — F17200 Nicotine dependence, unspecified, uncomplicated: Secondary | ICD-10-CM | POA: Insufficient documentation

## 2012-10-13 DIAGNOSIS — R197 Diarrhea, unspecified: Secondary | ICD-10-CM | POA: Insufficient documentation

## 2012-10-13 DIAGNOSIS — R109 Unspecified abdominal pain: Secondary | ICD-10-CM

## 2012-10-13 DIAGNOSIS — R0602 Shortness of breath: Secondary | ICD-10-CM | POA: Insufficient documentation

## 2012-10-13 DIAGNOSIS — Z8619 Personal history of other infectious and parasitic diseases: Secondary | ICD-10-CM | POA: Insufficient documentation

## 2012-10-13 DIAGNOSIS — F341 Dysthymic disorder: Secondary | ICD-10-CM | POA: Insufficient documentation

## 2012-10-13 DIAGNOSIS — Z8719 Personal history of other diseases of the digestive system: Secondary | ICD-10-CM | POA: Insufficient documentation

## 2012-10-13 DIAGNOSIS — G8929 Other chronic pain: Secondary | ICD-10-CM | POA: Insufficient documentation

## 2012-10-13 DIAGNOSIS — Z8639 Personal history of other endocrine, nutritional and metabolic disease: Secondary | ICD-10-CM | POA: Insufficient documentation

## 2012-10-13 DIAGNOSIS — Z87442 Personal history of urinary calculi: Secondary | ICD-10-CM | POA: Insufficient documentation

## 2012-10-13 LAB — CBC WITH DIFFERENTIAL/PLATELET
Basophils Absolute: 0 10*3/uL (ref 0.0–0.1)
Basophils Relative: 0 % (ref 0–1)
Eosinophils Absolute: 0.1 K/uL (ref 0.0–0.7)
Eosinophils Relative: 2 % (ref 0–5)
HCT: 39.3 % (ref 36.0–46.0)
Hemoglobin: 13.1 g/dL (ref 12.0–15.0)
Lymphocytes Relative: 22 % (ref 12–46)
Lymphs Abs: 1.5 10*3/uL (ref 0.7–4.0)
MCH: 36.9 pg — ABNORMAL HIGH (ref 26.0–34.0)
MCHC: 33.3 g/dL (ref 30.0–36.0)
MCV: 110.7 fL — ABNORMAL HIGH (ref 78.0–100.0)
Monocytes Absolute: 0.4 K/uL (ref 0.1–1.0)
Monocytes Relative: 6 % (ref 3–12)
Neutro Abs: 4.8 K/uL (ref 1.7–7.7)
Neutrophils Relative %: 70 % (ref 43–77)
Platelets: 231 10*3/uL (ref 150–400)
RBC: 3.55 MIL/uL — ABNORMAL LOW (ref 3.87–5.11)
RDW: 12.5 % (ref 11.5–15.5)
WBC: 6.9 10*3/uL (ref 4.0–10.5)

## 2012-10-13 LAB — URINALYSIS, ROUTINE W REFLEX MICROSCOPIC
Bilirubin Urine: NEGATIVE
Glucose, UA: NEGATIVE mg/dL
Ketones, ur: NEGATIVE mg/dL
Leukocytes, UA: NEGATIVE
Nitrite: NEGATIVE
Protein, ur: NEGATIVE mg/dL
Specific Gravity, Urine: 1.008 (ref 1.005–1.030)
Urobilinogen, UA: 0.2 mg/dL (ref 0.0–1.0)
pH: 7 (ref 5.0–8.0)

## 2012-10-13 LAB — URINE MICROSCOPIC-ADD ON

## 2012-10-13 LAB — COMPREHENSIVE METABOLIC PANEL
ALT: 10 U/L (ref 0–35)
AST: 14 U/L (ref 0–37)
Alkaline Phosphatase: 93 U/L (ref 39–117)
CO2: 25 mEq/L (ref 19–32)
GFR calc Af Amer: >90 mL/min (ref >90)
GFR calc non Af Amer: 86 mL/min — ABNORMAL LOW (ref >90)
Glucose, Bld: 95 mg/dL (ref 70–99)
Potassium: 2.9 mEq/L — ABNORMAL LOW (ref 3.5–5.1)
Sodium: 144 mEq/L (ref 135–145)
Total Protein: 6.7 g/dL (ref 6.0–8.3)

## 2012-10-13 LAB — COMPREHENSIVE METABOLIC PANEL WITH GFR
Albumin: 3.8 g/dL (ref 3.5–5.2)
BUN: 9 mg/dL (ref 6–23)
Calcium: 9.4 mg/dL (ref 8.4–10.5)
Chloride: 108 meq/L (ref 96–112)
Creatinine, Ser: 0.8 mg/dL (ref 0.50–1.10)
Total Bilirubin: 0.3 mg/dL (ref 0.3–1.2)

## 2012-10-13 LAB — OCCULT BLOOD X 1 CARD TO LAB, STOOL: Fecal Occult Bld: NEGATIVE

## 2012-10-13 LAB — LIPASE, BLOOD: Lipase: 15 U/L (ref 11–59)

## 2012-10-13 LAB — PREGNANCY, URINE: Preg Test, Ur: NEGATIVE

## 2012-10-13 MED ORDER — METOCLOPRAMIDE HCL 5 MG/ML IJ SOLN: 10.0000 mg | Freq: Once | INTRAMUSCULAR | Status: AC

## 2012-10-13 MED ORDER — SODIUM CHLORIDE 0.9 % IV BOLUS (SEPSIS)
1000.0000 mL | Freq: Once | INTRAVENOUS | Status: AC
  Administered 2012-10-13: 1000 mL via INTRAVENOUS

## 2012-10-13 MED ORDER — HYDROCODONE-ACETAMINOPHEN 5-325 MG PO TABS: ORAL_TABLET | ORAL | Status: DC

## 2012-10-13 MED ORDER — METOCLOPRAMIDE HCL 5 MG/ML IJ SOLN
10.0000 mg | Freq: Once | INTRAMUSCULAR | Status: AC
  Administered 2012-10-13: 10 mg via INTRAVENOUS
  Filled 2012-10-13: qty 2

## 2012-10-13 MED ORDER — POTASSIUM CHLORIDE ER 10 MEQ PO TBCR: 10.0000 meq | EXTENDED_RELEASE_TABLET | Freq: Two times a day (BID) | ORAL | Status: DC

## 2012-10-13 MED ORDER — HYDROMORPHONE HCL PF 1 MG/ML IJ SOLN
1.0000 mg | Freq: Once | INTRAMUSCULAR | Status: AC
  Administered 2012-10-13: 1 mg via INTRAVENOUS
  Filled 2012-10-13: qty 1

## 2012-10-13 MED ORDER — METOCLOPRAMIDE HCL 5 MG/ML IJ SOLN
INTRAMUSCULAR | Status: AC
  Filled 2012-10-13: qty 2

## 2012-10-13 MED ORDER — HYDROMORPHONE HCL PF 1 MG/ML IJ SOLN: 1.0000 mg | Freq: Once | INTRAMUSCULAR | Status: AC

## 2012-10-13 MED ORDER — METOCLOPRAMIDE HCL 5 MG/ML IJ SOLN
INTRAMUSCULAR | Status: AC
  Administered 2012-10-13: 10 mg via INTRAVENOUS
  Filled 2012-10-13: qty 2

## 2012-10-13 MED ORDER — POTASSIUM CHLORIDE CRYS ER 20 MEQ PO TBCR
40.0000 meq | EXTENDED_RELEASE_TABLET | Freq: Once | ORAL | Status: AC
  Administered 2012-10-13: 40 meq via ORAL
  Filled 2012-10-13: qty 2

## 2012-10-13 MED ORDER — HYDROMORPHONE HCL PF 1 MG/ML IJ SOLN
INTRAMUSCULAR | Status: AC
  Administered 2012-10-13: 1 mg via INTRAVENOUS
  Filled 2012-10-13: qty 1

## 2012-10-13 MED ORDER — POTASSIUM CHLORIDE IN NACL 20-0.9 MEQ/L-% IV SOLN
Freq: Once | INTRAVENOUS | Status: AC
  Administered 2012-10-13: 16:00:00 via INTRAVENOUS
  Filled 2012-10-13: qty 1000

## 2012-10-13 MED ORDER — METOCLOPRAMIDE HCL 5 MG/ML IJ SOLN
10.0000 mg | Freq: Once | INTRAMUSCULAR | Status: AC
  Administered 2012-10-13: 10 mg via INTRAVENOUS

## 2012-10-13 MED ORDER — PROMETHAZINE HCL 25 MG PO TABS: 25.0000 mg | ORAL_TABLET | Freq: Four times a day (QID) | ORAL | Status: DC | PRN

## 2012-10-13 MED ORDER — LORAZEPAM 1 MG PO TABS: 0.5000 mg | ORAL_TABLET | Freq: Three times a day (TID) | ORAL | Status: DC | PRN

## 2012-10-13 NOTE — Progress Notes (Signed)
5:30 PM I reviewed pt's lab work with her.  Her low potassium from diarrhea seems to be the main issue at present. She is halfway through her 20 meq bag of KCL.  Will finish that out.

## 2012-10-13 NOTE — ED Provider Notes (Signed)
History    CSN: [id number] Arrival date & time 10/13/12  1224  First MD Initiated Contact with Patient 10/13/12 1240     Chief Complaint  Patient presents with  . Abdominal Pain  . Nausea  . Emesis  . Diarrhea   (Consider location/radiation/quality/duration/timing/severity/associated sxs/prior Treatment) HPI Comments: Pt with h/o Crohn's, not on steroids as oral steroids causes psychosis, has chronic diarrhea, has developed same severe pain in RUQ mostly associated with N/V only after eating.  Possibly fevers to 100 at home over the past 2 days.  No cough, CP, back pain.  No dysuria, or frequency.  No GYN symptoms.  She has felt a little more SOB than usual, has had pneumonia in the past presenting just as SOB.  No new medications including abx, no foreign travel.  No obv sick contacts.  Doesn't have a PCP because she is planning on moving to [place] in the next 2 weeks.  Has a h/o being hospitalized for colitis in the past.    Patient is a 47 y.o. female presenting with abdominal pain, vomiting, and diarrhea. The history is provided by the patient and medical records.  Abdominal Pain This is a recurrent problem. The current episode started more than 2 days ago. The problem occurs constantly. The problem has been gradually worsening. Associated symptoms include abdominal pain and shortness of breath. Pertinent negatives include no chest pain. The symptoms are aggravated by twisting and eating. The symptoms are relieved by rest.  Emesis Severity:  Moderate Duration:  3 days Timing:  Intermittent Quality:  Undigested food How soon after eating does vomiting occur:  5 minutes Progression:  Unchanged Chronicity:  Recurrent Associated symptoms: abdominal pain, chills and diarrhea   Abdominal pain:    Location:  RUQ, RLQ and R flank   Quality:  Pressure, squeezing, cramping, aching and stabbing   Severity:  Severe   Onset quality:  Gradual   Duration:  2 days   Timing:  Constant  Progression:  Waxing and waning   Chronicity:  Recurrent Diarrhea:    Quality:  Semi-solid   Severity:  Moderate   Progression:  Unchanged Diarrhea Associated symptoms: abdominal pain, chills, fever and vomiting    Past Medical History  Diagnosis Date  . Collagenous colitis   . WPW (Wolff-Parkinson-White syndrome)   . Kidney stone   . Addison disease   . Thyroid disease   . Clostridium difficile diarrhea   . Anxiety and depression   . Chronic diarrhea   . Chronic back pain   . Drug-seeking behavior   . Irritable bowel syndrome (IBS)   . Anemia   . Kidney stone   . Pancreatitis    Past Surgical History  Procedure Laterality Date  . Cholecystectomy    . Tubal ligation    . Lithotripsy    . Sphincterotomy    . Cardiac electrophysiology mapping and ablation      for WPW  . Video bronchoscopy  08/31/2011    Procedure: VIDEO BRONCHOSCOPY WITH FLUORO;  Surgeon: Storm Frisk, MD;  Location: [facility];  Service: Cardiopulmonary;  Laterality: N/A;   Family History  Problem Relation Age of Onset  . Prostate cancer Father   . Coronary artery disease Maternal Grandfather    History  Substance Use Topics  . Smoking status: Current Every Day Smoker -- 0.50 packs/day for 30 years    Types: Cigarettes  . Smokeless tobacco: Never Used  . Alcohol Use: Yes     Comment:  seldom   OB History   Grav Para Term Preterm Abortions TAB SAB Ect Mult Living   3 3             Review of Systems  Constitutional: Positive for fever, chills and appetite change.  HENT: Negative for congestion and rhinorrhea.   Respiratory: Positive for shortness of breath. Negative for cough.   Cardiovascular: Negative for chest pain.  Gastrointestinal: Positive for vomiting, abdominal pain and diarrhea.  Genitourinary: Negative for dysuria, vaginal bleeding, vaginal discharge and vaginal pain.  Musculoskeletal: Negative for back pain.  Skin: Negative for rash.  All other systems reviewed and are  negative.    Allergies  Prednisone; Ambien; Nsaids; and Zofran  Home Medications   Current Outpatient Rx  Name  Route  Sig  Dispense  Refill  . Budesonide 9 MG TB24   Oral   Take 9 mg by mouth daily. Prescription provided by [facility].         . cloNIDine (CATAPRES) 0.1 MG tablet   Oral   Take 1 tablet (0.1 mg total) by mouth 3 (three) times daily.   30 tablet   1   . colestipol (COLESTID) 1 G tablet   Oral   Take 3 tablets (3 g total) by mouth 2 (two) times daily.   180 tablet   0   . HYDROcodone-acetaminophen (NORCO/VICODIN) 5-325 MG per tablet      1-2 tablets po q 6 hours prn moderate to severe pain   20 tablet   0   . hydrocortisone (CORTEF) 10 MG tablet   Oral   Take 10 mg by mouth daily.         Marland Kitchen levofloxacin (LEVAQUIN) 750 MG tablet   Oral   Take 750 mg by mouth daily.         Marland Kitchen levothyroxine (SYNTHROID, LEVOTHROID) 50 MCG tablet   Oral   Take 50 mcg by mouth daily.         Marland Kitchen loperamide (IMODIUM) 2 MG capsule   Oral   Take 2 mg by mouth 4 (four) times daily as needed for diarrhea or loose stools.         Marland Kitchen LORazepam (ATIVAN) 1 MG tablet   Oral   Take 1 mg by mouth every 8 (eight) hours as needed. anxiety         . LORazepam (ATIVAN) 1 MG tablet   Oral   Take 0.5 tablets (0.5 mg total) by mouth every 8 (eight) hours as needed.   15 tablet   0   . Multiple Vitamin (MULITIVITAMIN WITH MINERALS) TABS   Oral   Take 1 tablet by mouth daily.          . nortriptyline (PAMELOR) 10 MG capsule   Oral   Take 10 mg by mouth at bedtime.          . potassium chloride (K-DUR) 10 MEQ tablet   Oral   Take 2 tablets (20 mEq total) by mouth 2 (two) times daily.   10 tablet   0   . potassium chloride (K-DUR) 10 MEQ tablet   Oral   Take 1 tablet (10 mEq total) by mouth 2 (two) times daily.   20 tablet   0   . promethazine (PHENERGAN) 25 MG tablet   Oral   Take 25 mg by mouth every 6 (six) hours as needed for nausea.         .  promethazine (PHENERGAN) 25 MG tablet  Oral   Take 1 tablet (25 mg total) by mouth every 6 (six) hours as needed for nausea.   20 tablet   0   . Vitamin D, Ergocalciferol, (DRISDOL) 50000 UNITS CAPS   Oral   Take 50,000 Units by mouth every Monday. Take on monday          BP 148/84  Pulse 57  Temp(Src) 98.7 F (37.1 C) (Rectal)  Resp 18  Ht 5\' 4"  (1.626 m)  Wt 131 lb (59.421 kg)  BMI 22.47 kg/m2  SpO2 100%  LMP 09/16/2012 Physical Exam  Nursing note and vitals reviewed. Constitutional: She appears well-developed and well-nourished. She appears distressed.  HENT:  Head: Normocephalic and atraumatic.  Eyes: Conjunctivae and EOM are normal. No scleral icterus.  Neck: Normal range of motion. Neck supple.  Cardiovascular: Normal rate, regular rhythm and intact distal pulses.   No murmur heard. Pulmonary/Chest: Effort normal. No respiratory distress. She has no wheezes. She has no rales.  Abdominal: Soft. Normal appearance and bowel sounds are normal. She exhibits no distension. There is tenderness. There is guarding and CVA tenderness. There is no rebound.    Neurological: She exhibits normal muscle tone. Coordination normal.  Skin: Skin is warm and dry. No rash noted. She is not diaphoretic.  Psychiatric: She has a normal mood and affect.    ED Course  Procedures (including critical care time) Labs Reviewed  URINALYSIS, ROUTINE W REFLEX MICROSCOPIC - Abnormal; Notable for the following:    APPearance CLOUDY (*)    Hgb urine dipstick LARGE (*)    All other components within normal limits  CBC WITH DIFFERENTIAL - Abnormal; Notable for the following:    RBC 3.55 (*)    MCV 110.7 (*)    MCH 36.9 (*)    All other components within normal limits  COMPREHENSIVE METABOLIC PANEL - Abnormal; Notable for the following:    Potassium 2.9 (*)    GFR calc non Af Amer 86 (*)    All other components within normal limits  URINE MICROSCOPIC-ADD ON - Abnormal; Notable for the  following:    Squamous Epithelial / LPF MANY (*)    Bacteria, UA MANY (*)    All other components within normal limits  PREGNANCY, URINE  LIPASE, BLOOD  OCCULT BLOOD X 1 CARD TO LAB, STOOL   Dg Chest 2 View  10/13/2012   *RADIOLOGY REPORT*  Clinical Data: Shortness of breath.  Right side abdominal pain.  CHEST - 2 VIEW  Comparison: PA and lateral chest 06/01/2012.  Findings: Lungs are clear.  Heart size is normal.  No pneumothorax or pleural fluid.  IMPRESSION: No acute disease.   Original Report Authenticated By: Holley Dexter, M.D.   US Abdomen Complete  10/13/2012   *RADIOLOGY REPORT*  Clinical Data:  Abdominal pain.  Nausea.  Diarrhea.  Prior cholecystectomy.  Crohn's disease.  ABDOMINAL ULTRASOUND COMPLETE  Comparison:  None.  Findings:  Gallbladder:  Surgically absent  Common Bile Duct:  Within normal limits in caliber. Measures 7 mm in diameter.  Liver: No focal mass lesion identified.  Within normal limits in parenchymal echogenicity.  IVC:  Appears normal.  Pancreas:  No abnormality identified.  Spleen:  Within normal limits in size and echotexture.  Right kidney:  Normal in size and parenchymal echogenicity.  No evidence of mass or hydronephrosis.  Probable small nonobstructive calculi noted.  Left kidney:  Normal in size and parenchymal echogenicity.  No evidence of mass or hydronephrosis.  Probable small  nonobstructive calculi noted.  Abdominal Aorta:  No aneurysm identified.  Several images of the urinary bladder are unremarkable for degree of bladder filling.  IMPRESSION:  1.  Prior cholecystectomy.  No evidence of biliary dilatation or other acute findings. 2.  Probable small nonobstructive bilateral renal calculi.  No evidence of hydronephrosis.   Original Report Authenticated By: Myles Rosenthal, M.D.   1. Abdominal pain   2. Nausea and vomiting in adult     ra sat is 100% and I interpret to be normal.  Pt has a normal wbc, hemoccult is neg.  Doubt this is a Crohns flare.  Since  numerous CTs in the past, obtained u/s since hematuria was present and has h/o renal stones.  Neg for hydro on u/s.  Symptoms treated with improvement in pain, IV and oral K+ given. Pt reports she can follow up with her own pcp if needed.  MDM  Pt has had nearly 20 CT's of either chest or abd/pelvis since 2010.  I have voiced my concerns of having repeated ED visits, has stopped seeing her PCP and freq CT's and numerous xryas also.  Pt has had documented colitis about half the time reviewing her prior CT scans.  Pt has pain in the same location as previously.  No fever.  Pt with some guarding, but no rebound, not toxic appearing.  Pt does have blood in urine and last CT from [facility] notes nephrolithiasis, thus will spare radiation and perform U/S for now to assess for CBD dilatation or hydronephrosis on the right  Gavin Pound. Oletta Lamas, MD 10/14/12 2207

## 2012-10-13 NOTE — ED Notes (Signed)
Pt reports abdominal pain, nausea, vomiting, and diarrhea since Thursday.

## 2012-10-13 NOTE — Discharge Instructions (Signed)
Abdominal Pain Abdominal pain can be caused by many things. Your caregiver decides the seriousness of your pain by an examination and possibly blood tests and X-rays. Many cases can be observed and treated at home. Most abdominal pain is not caused by a disease and will probably improve without treatment. However, in many cases, more time must pass before a clear cause of the pain can be found. Before that point, it may not be known if you need more testing, or if hospitalization or surgery is needed. HOME CARE INSTRUCTIONS   Do not take laxatives unless directed by your caregiver.  Take pain medicine only as directed by your caregiver.  Only take over-the-counter or prescription medicines for pain, discomfort, or fever as directed by your caregiver.  Try a clear liquid diet (broth, tea, or water) for as long as directed by your caregiver. Slowly move to a bland diet as tolerated. SEEK IMMEDIATE MEDICAL CARE IF:   The pain does not go away.  You have a fever.  You keep throwing up (vomiting).  The pain is felt only in portions of the abdomen. Pain in the right side could possibly be appendicitis. In an adult, pain in the left lower portion of the abdomen could be colitis or diverticulitis.  You pass bloody or black tarry stools. MAKE SURE YOU:   Understand these instructions.  Will watch your condition.  Will get help right away if you are not doing well or get worse. Document Released: 12/29/2004 Document Revised: 06/13/2011 Document Reviewed: 11/07/2007 [employer] Patient Information 2014 [employer].  

## 2012-10-28 ENCOUNTER — Encounter (HOSPITAL_BASED_OUTPATIENT_CLINIC_OR_DEPARTMENT_OTHER): Payer: Self-pay

## 2012-10-28 ENCOUNTER — Emergency Department (HOSPITAL_BASED_OUTPATIENT_CLINIC_OR_DEPARTMENT_OTHER)
Admission: EM | Admit: 2012-10-28 | Discharge: 2012-10-28 | Discharge status: Against Medical Advice | Payer: BC Managed Care – PPO | Attending: Emergency Medicine | Admitting: Emergency Medicine

## 2012-10-28 DIAGNOSIS — R197 Diarrhea, unspecified: Secondary | ICD-10-CM | POA: Insufficient documentation

## 2012-10-28 DIAGNOSIS — R1110 Vomiting, unspecified: Secondary | ICD-10-CM | POA: Insufficient documentation

## 2012-10-28 DIAGNOSIS — E079 Disorder of thyroid, unspecified: Secondary | ICD-10-CM | POA: Insufficient documentation

## 2012-10-28 DIAGNOSIS — Z79899 Other long term (current) drug therapy: Secondary | ICD-10-CM | POA: Insufficient documentation

## 2012-10-28 DIAGNOSIS — R509 Fever, unspecified: Secondary | ICD-10-CM | POA: Insufficient documentation

## 2012-10-28 DIAGNOSIS — I456 Pre-excitation syndrome: Secondary | ICD-10-CM | POA: Insufficient documentation

## 2012-10-28 DIAGNOSIS — Z8719 Personal history of other diseases of the digestive system: Secondary | ICD-10-CM | POA: Insufficient documentation

## 2012-10-28 DIAGNOSIS — R630 Anorexia: Secondary | ICD-10-CM | POA: Insufficient documentation

## 2012-10-28 DIAGNOSIS — R1084 Generalized abdominal pain: Secondary | ICD-10-CM | POA: Insufficient documentation

## 2012-10-28 DIAGNOSIS — Z862 Personal history of diseases of the blood and blood-forming organs and certain disorders involving the immune mechanism: Secondary | ICD-10-CM | POA: Insufficient documentation

## 2012-10-28 DIAGNOSIS — F341 Dysthymic disorder: Secondary | ICD-10-CM | POA: Insufficient documentation

## 2012-10-28 DIAGNOSIS — Z8619 Personal history of other infectious and parasitic diseases: Secondary | ICD-10-CM | POA: Insufficient documentation

## 2012-10-28 DIAGNOSIS — G8929 Other chronic pain: Secondary | ICD-10-CM | POA: Insufficient documentation

## 2012-10-28 DIAGNOSIS — F17200 Nicotine dependence, unspecified, uncomplicated: Secondary | ICD-10-CM | POA: Insufficient documentation

## 2012-10-28 DIAGNOSIS — R42 Dizziness and giddiness: Secondary | ICD-10-CM | POA: Insufficient documentation

## 2012-10-28 DIAGNOSIS — Z87442 Personal history of urinary calculi: Secondary | ICD-10-CM | POA: Insufficient documentation

## 2012-10-28 DIAGNOSIS — IMO0002 Reserved for concepts with insufficient information to code with codable children: Secondary | ICD-10-CM | POA: Insufficient documentation

## 2012-10-28 DIAGNOSIS — Z8639 Personal history of other endocrine, nutritional and metabolic disease: Secondary | ICD-10-CM | POA: Insufficient documentation

## 2012-10-28 DIAGNOSIS — Z9851 Tubal ligation status: Secondary | ICD-10-CM | POA: Insufficient documentation

## 2012-10-28 DIAGNOSIS — Z9089 Acquired absence of other organs: Secondary | ICD-10-CM | POA: Insufficient documentation

## 2012-10-28 LAB — COMPREHENSIVE METABOLIC PANEL
ALT: 12 U/L (ref 0–35)
AST: 14 U/L (ref 0–37)
Albumin: 4.4 g/dL (ref 3.5–5.2)
Alkaline Phosphatase: 171 U/L — ABNORMAL HIGH (ref 39–117)
BUN: 14 mg/dL (ref 6–23)
CO2: 21 mEq/L (ref 19–32)
Calcium: 10.4 mg/dL (ref 8.4–10.5)
Chloride: 101 mEq/L (ref 96–112)
Creatinine, Ser: 0.9 mg/dL (ref 0.50–1.10)
GFR calc Af Amer: 87 mL/min — ABNORMAL LOW (ref >90)
GFR calc non Af Amer: 75 mL/min — ABNORMAL LOW (ref >90)
Glucose, Bld: 94 mg/dL (ref 70–99)
Potassium: 3.4 mEq/L — ABNORMAL LOW (ref 3.5–5.1)
Sodium: 138 mEq/L (ref 135–145)
Total Bilirubin: 0.2 mg/dL — ABNORMAL LOW (ref 0.3–1.2)
Total Protein: 8 g/dL (ref 6.0–8.3)

## 2012-10-28 MED ORDER — LORAZEPAM 2 MG/ML IJ SOLN
1.0000 mg | Freq: Once | INTRAMUSCULAR | Status: AC
  Administered 2012-10-28: 1 mg via INTRAMUSCULAR
  Filled 2012-10-28: qty 1

## 2012-10-28 MED ORDER — ACETAMINOPHEN 650 MG RE SUPP
RECTAL | Status: AC
  Filled 2012-10-28: qty 1

## 2012-10-28 MED ORDER — ACETAMINOPHEN 500 MG PO TABS
1000.0000 mg | ORAL_TABLET | Freq: Once | ORAL | Status: DC
  Filled 2012-10-28: qty 2

## 2012-10-28 NOTE — ED Notes (Signed)
Patient reports that she awoke during the night with abdominal cramping, vomiting and diarrhea. Reports that the diarrhea is explosive. Has hx of colitis.

## 2012-10-28 NOTE — ED Provider Notes (Signed)
CSN: [id number]     Arrival date & time 10/28/12  1321 History     First MD Initiated Contact with Patient 10/28/12 1322     Chief Complaint  Patient presents with  . Emesis   (Consider location/radiation/quality/duration/timing/severity/associated sxs/prior Treatment) HPI Patient presents with concern of diarrhea, lightheadedness, dizziness. She also has abdominal pain. She states that although she has chronic pain, she currently has more persistent diarrhea at baseline, more persistent dizziness than baseline. This episode seems to have begun 2 days ago, without clear precipitant.  The patient was compliant with all medication prior to the onset of symptoms. She states that with her new dizziness she is unable to tolerate oral medication. She was seen by her physician last week, had her potassium dose increased. She states that she is supposed to have colonoscopy and endoscopy in 3 days in [place]. She denies new fevers, chills.  Past Medical History  Diagnosis Date  . Collagenous colitis   . WPW (Wolff-Parkinson-White syndrome)   . Kidney stone   . Addison disease   . Thyroid disease   . Clostridium difficile diarrhea   . Anxiety and depression   . Chronic diarrhea   . Chronic back pain   . Drug-seeking behavior   . Irritable bowel syndrome (IBS)   . Anemia   . Kidney stone   . Pancreatitis    Past Surgical History  Procedure Laterality Date  . Cholecystectomy    . Tubal ligation    . Lithotripsy    . Sphincterotomy    . Cardiac electrophysiology mapping and ablation      for WPW  . Video bronchoscopy  08/31/2011    Procedure: VIDEO BRONCHOSCOPY WITH FLUORO;  Surgeon: Storm Frisk, MD;  Location: [facility];  Service: Cardiopulmonary;  Laterality: N/A;   Family History  Problem Relation Age of Onset  . Prostate cancer Father   . Coronary artery disease Maternal Grandfather    History  Substance Use Topics  . Smoking status: Current Every Day  Smoker -- 0.50 packs/day for 30 years    Types: Cigarettes  . Smokeless tobacco: Never Used  . Alcohol Use: Yes     Comment: seldom   OB History   Grav Para Term Preterm Abortions TAB SAB Ect Mult Living   3 3             Review of Systems  Constitutional: Positive for fever, chills and appetite change.  HENT: Negative for congestion and rhinorrhea.   Respiratory: Negative for cough and shortness of breath.   Cardiovascular: Negative for chest pain.  Gastrointestinal: Positive for vomiting, abdominal pain and diarrhea.  Genitourinary: Negative for dysuria, vaginal bleeding, vaginal discharge and vaginal pain.  Musculoskeletal: Negative for back pain.  Skin: Negative for rash.  All other systems reviewed and are negative.    Allergies  Prednisone; Ambien; Nsaids; and Zofran  Home Medications   Current Outpatient Rx  Name  Route  Sig  Dispense  Refill  . Budesonide 9 MG TB24   Oral   Take 9 mg by mouth daily. Prescription provided by [facility].         . cloNIDine (CATAPRES) 0.1 MG tablet   Oral   Take 1 tablet (0.1 mg total) by mouth 3 (three) times daily.   30 tablet   1   . colestipol (COLESTID) 1 G tablet   Oral   Take 3 tablets (3 g total) by mouth 2 (two) times daily.  180 tablet   0   . HYDROcodone-acetaminophen (NORCO/VICODIN) 5-325 MG per tablet      1-2 tablets po q 6 hours prn moderate to severe pain   20 tablet   0   . hydrocortisone (CORTEF) 10 MG tablet   Oral   Take 10 mg by mouth daily.         Marland Kitchen levofloxacin (LEVAQUIN) 750 MG tablet   Oral   Take 750 mg by mouth daily.         Marland Kitchen levothyroxine (SYNTHROID, LEVOTHROID) 50 MCG tablet   Oral   Take 50 mcg by mouth daily.         Marland Kitchen loperamide (IMODIUM) 2 MG capsule   Oral   Take 2 mg by mouth 4 (four) times daily as needed for diarrhea or loose stools.         Marland Kitchen LORazepam (ATIVAN) 1 MG tablet   Oral   Take 1 mg by mouth every 8 (eight) hours as needed. anxiety         .  LORazepam (ATIVAN) 1 MG tablet   Oral   Take 0.5 tablets (0.5 mg total) by mouth every 8 (eight) hours as needed.   15 tablet   0   . Multiple Vitamin (MULITIVITAMIN WITH MINERALS) TABS   Oral   Take 1 tablet by mouth daily.          . nortriptyline (PAMELOR) 10 MG capsule   Oral   Take 10 mg by mouth at bedtime.          . potassium chloride (K-DUR) 10 MEQ tablet   Oral   Take 2 tablets (20 mEq total) by mouth 2 (two) times daily.   10 tablet   0   . potassium chloride (K-DUR) 10 MEQ tablet   Oral   Take 1 tablet (10 mEq total) by mouth 2 (two) times daily.   20 tablet   0   . promethazine (PHENERGAN) 25 MG tablet   Oral   Take 25 mg by mouth every 6 (six) hours as needed for nausea.         . promethazine (PHENERGAN) 25 MG tablet   Oral   Take 1 tablet (25 mg total) by mouth every 6 (six) hours as needed for nausea.   20 tablet   0   . Vitamin D, Ergocalciferol, (DRISDOL) 50000 UNITS CAPS   Oral   Take 50,000 Units by mouth every Monday. Take on monday          BP 121/97  Pulse 132  Temp(Src) 98.2 F (36.8 C) (Oral)  Resp 20  Wt 133 lb (60.328 kg)  BMI 22.82 kg/m2  SpO2 99%  LMP 09/16/2012 Physical Exam  Nursing note and vitals reviewed. Constitutional: She appears well-developed and well-nourished. She appears distressed.  HENT:  Head: Normocephalic and atraumatic.  Eyes: Conjunctivae and EOM are normal. No scleral icterus.  Neck: Normal range of motion. Neck supple.  Cardiovascular: Normal rate, regular rhythm and intact distal pulses.   No murmur heard. Pulmonary/Chest: Effort normal. No respiratory distress. She has no wheezes. She has no rales.  Abdominal: Soft. Normal appearance and bowel sounds are normal. She exhibits no distension. There is generalized tenderness. There is guarding. There is no rigidity, no rebound and no CVA tenderness.  Non-peritoneal abdomen, soft when distracted.  Neurological: She exhibits normal muscle tone.  Coordination normal.  Skin: Skin is warm and dry. No rash noted. She is not diaphoretic.  Psychiatric: She has a normal mood and affect.  Patient clearly has little insight into her condition.    ED Course    Prior to the initial evaluation I reviewed the patient's chart, including records from other facilities.  Patient has had multiple ER and primary care visits over the past 2 months.  She has had 6 CT scan, one ultrasound, 1 colonoscopy during this time.  She has a provider notes stating that she should not receive narcotics from anyone other than her primary care physician.  Following initial evaluation I discussed all of these documents with the patient.  Procedures (including critical care time)  Labs Reviewed  COMPREHENSIVE METABOLIC PANEL   No results found. No diagnosis found.   2:33 PM Patient requests discharge.  She states that she would like to go to her gastroenterologist's office.  She voices frustration that she has not received IV fluids, narcotics.  I reiterated to her that pending the lab results, given her clinical euvolemic status, the absence of hypotension, emergent IV resuscitation with fluid was not indicated until labs were obtained. Patient left without receiving discharge paperwork.  Notably, gait was antalgic, with no evidence of disequilibrium or suggestion of clinical dizziness. MDM  Patient presents with concern of ongoing diarrhea, dizziness.  On initial exam the patient is awake and alert, appropriate interactive.  There were her triage vital signs included tachycardia, on my exam her heart rate was normal.  She was euvolemic, with normal blood pressure, and although she describes ongoing diarrhea, she was in no distress with no bowel movements here. Patient was also neurologically intact, with no diminished head motion, no changes in gait suggestive of significant dizziness. With the patient's history of multiple ER presentations to multiple  facilities, repetitive imaging, initial narcotics, imaging, fluids were held pending initial laboratory evaluation.  Patient seemed satisfied with this approach.  Given the absence of fever, distress, with her stated, capacity to make decisions, she was stable to leave prior to completion of her evaluation, though she was counseled not to do so.   Gerhard Munch, MD 10/28/12 1436

## 2012-10-28 NOTE — ED Notes (Signed)
Patient states that she is going to the hospital where her GI doctor is located.

## 2012-11-01 ENCOUNTER — Emergency Department (HOSPITAL_COMMUNITY)
Admission: EM | Admit: 2012-11-01 | Discharge: 2012-11-01 | Disposition: A | Discharge status: Home / Self Care | Payer: BC Managed Care – PPO | Attending: Emergency Medicine | Admitting: Emergency Medicine

## 2012-11-01 ENCOUNTER — Encounter (HOSPITAL_COMMUNITY): Payer: Self-pay | Admitting: Emergency Medicine

## 2012-11-01 DIAGNOSIS — Z8619 Personal history of other infectious and parasitic diseases: Secondary | ICD-10-CM | POA: Insufficient documentation

## 2012-11-01 DIAGNOSIS — E079 Disorder of thyroid, unspecified: Secondary | ICD-10-CM | POA: Insufficient documentation

## 2012-11-01 DIAGNOSIS — Z9889 Other specified postprocedural states: Secondary | ICD-10-CM | POA: Insufficient documentation

## 2012-11-01 DIAGNOSIS — Z3202 Encounter for pregnancy test, result negative: Secondary | ICD-10-CM | POA: Insufficient documentation

## 2012-11-01 DIAGNOSIS — Z87442 Personal history of urinary calculi: Secondary | ICD-10-CM | POA: Insufficient documentation

## 2012-11-01 DIAGNOSIS — Z8719 Personal history of other diseases of the digestive system: Secondary | ICD-10-CM | POA: Insufficient documentation

## 2012-11-01 DIAGNOSIS — G8929 Other chronic pain: Secondary | ICD-10-CM | POA: Insufficient documentation

## 2012-11-01 DIAGNOSIS — R197 Diarrhea, unspecified: Secondary | ICD-10-CM | POA: Insufficient documentation

## 2012-11-01 DIAGNOSIS — R112 Nausea with vomiting, unspecified: Secondary | ICD-10-CM | POA: Insufficient documentation

## 2012-11-01 DIAGNOSIS — R12 Heartburn: Secondary | ICD-10-CM | POA: Insufficient documentation

## 2012-11-01 DIAGNOSIS — IMO0002 Reserved for concepts with insufficient information to code with codable children: Secondary | ICD-10-CM | POA: Insufficient documentation

## 2012-11-01 DIAGNOSIS — R42 Dizziness and giddiness: Secondary | ICD-10-CM | POA: Insufficient documentation

## 2012-11-01 DIAGNOSIS — R109 Unspecified abdominal pain: Secondary | ICD-10-CM | POA: Insufficient documentation

## 2012-11-01 DIAGNOSIS — Z862 Personal history of diseases of the blood and blood-forming organs and certain disorders involving the immune mechanism: Secondary | ICD-10-CM | POA: Insufficient documentation

## 2012-11-01 DIAGNOSIS — Z79899 Other long term (current) drug therapy: Secondary | ICD-10-CM | POA: Insufficient documentation

## 2012-11-01 DIAGNOSIS — Z8639 Personal history of other endocrine, nutritional and metabolic disease: Secondary | ICD-10-CM | POA: Insufficient documentation

## 2012-11-01 DIAGNOSIS — R1110 Vomiting, unspecified: Secondary | ICD-10-CM

## 2012-11-01 DIAGNOSIS — F17200 Nicotine dependence, unspecified, uncomplicated: Secondary | ICD-10-CM | POA: Insufficient documentation

## 2012-11-01 DIAGNOSIS — Z8659 Personal history of other mental and behavioral disorders: Secondary | ICD-10-CM | POA: Insufficient documentation

## 2012-11-01 DIAGNOSIS — Z8679 Personal history of other diseases of the circulatory system: Secondary | ICD-10-CM | POA: Insufficient documentation

## 2012-11-01 LAB — URINE MICROSCOPIC-ADD ON

## 2012-11-01 LAB — COMPREHENSIVE METABOLIC PANEL
Alkaline Phosphatase: 170 U/L — ABNORMAL HIGH (ref 39–117)
BUN: 20 mg/dL (ref 6–23)
Creatinine, Ser: 1.23 mg/dL — ABNORMAL HIGH (ref 0.50–1.10)
GFR calc Af Amer: 60 mL/min — ABNORMAL LOW (ref >90)
Glucose, Bld: 114 mg/dL — ABNORMAL HIGH (ref 70–99)
Potassium: 2.1 mEq/L — CL (ref 3.5–5.1)
Total Bilirubin: 0.2 mg/dL — ABNORMAL LOW (ref 0.3–1.2)
Total Protein: 7.9 g/dL (ref 6.0–8.3)

## 2012-11-01 LAB — CBC WITH DIFFERENTIAL/PLATELET
Basophils Relative: 1 % (ref 0–1)
Eosinophils Absolute: 0.2 10*3/uL (ref 0.0–0.7)
Eosinophils Relative: 2 % (ref 0–5)
HCT: 44.9 % (ref 36.0–46.0)
Hemoglobin: 15.9 g/dL — ABNORMAL HIGH (ref 12.0–15.0)
Lymphs Abs: 2.4 10*3/uL (ref 0.7–4.0)
MCH: 36.6 pg — ABNORMAL HIGH (ref 26.0–34.0)
MCHC: 35.4 g/dL (ref 30.0–36.0)
MCV: 103.5 fL — ABNORMAL HIGH (ref 78.0–100.0)
Monocytes Absolute: 0.8 10*3/uL (ref 0.1–1.0)
Monocytes Relative: 9 % (ref 3–12)
Neutrophils Relative %: 61 % (ref 43–77)
RBC: 4.34 MIL/uL (ref 3.87–5.11)

## 2012-11-01 LAB — POCT I-STAT, CHEM 8
BUN: 19 mg/dL (ref 6–23)
Calcium, Ion: 1.16 mmol/L (ref 1.12–1.23)
Chloride: 107 mEq/L (ref 96–112)
Creatinine, Ser: 1 mg/dL (ref 0.50–1.10)
Glucose, Bld: 92 mg/dL (ref 70–99)
HCT: 37 % (ref 36.0–46.0)
Potassium: 2.9 mEq/L — ABNORMAL LOW (ref 3.5–5.1)

## 2012-11-01 LAB — LIPASE, BLOOD: Lipase: 53 U/L (ref 11–59)

## 2012-11-01 LAB — URINALYSIS, ROUTINE W REFLEX MICROSCOPIC
Glucose, UA: NEGATIVE mg/dL
Hgb urine dipstick: NEGATIVE
Ketones, ur: NEGATIVE mg/dL
Specific Gravity, Urine: 1.031 — ABNORMAL HIGH (ref 1.005–1.030)
pH: 6 (ref 5.0–8.0)

## 2012-11-01 MED ORDER — SODIUM CHLORIDE 0.9 % IV BOLUS (SEPSIS)
1000.0000 mL | Freq: Once | INTRAVENOUS | Status: AC
  Administered 2012-11-01: 1000 mL via INTRAVENOUS

## 2012-11-01 MED ORDER — METOCLOPRAMIDE HCL 5 MG/ML IJ SOLN
10.0000 mg | Freq: Once | INTRAMUSCULAR | Status: AC
  Administered 2012-11-01: 10 mg via INTRAVENOUS
  Filled 2012-11-01: qty 2

## 2012-11-01 MED ORDER — PROMETHAZINE HCL 25 MG/ML IJ SOLN
25.0000 mg | Freq: Once | INTRAMUSCULAR | Status: AC
  Administered 2012-11-01: 25 mg via INTRAVENOUS
  Filled 2012-11-01: qty 1

## 2012-11-01 MED ORDER — PROMETHAZINE HCL 25 MG/ML IJ SOLN
25.0000 mg | Freq: Once | INTRAMUSCULAR | Status: AC
  Administered 2012-11-01: 25 mg via INTRAVENOUS
  Filled 2012-11-01 (×2): qty 1

## 2012-11-01 MED ORDER — PROMETHAZINE HCL 25 MG PO TABS: 25.0000 mg | ORAL_TABLET | Freq: Four times a day (QID) | ORAL | Status: DC | PRN

## 2012-11-01 MED ORDER — POTASSIUM CHLORIDE CRYS ER 20 MEQ PO TBCR
40.0000 meq | EXTENDED_RELEASE_TABLET | Freq: Once | ORAL | Status: AC
  Administered 2012-11-01: 40 meq via ORAL
  Filled 2012-11-01: qty 2

## 2012-11-01 MED ORDER — POTASSIUM CHLORIDE IN NACL 20-0.9 MEQ/L-% IV SOLN
Freq: Once | INTRAVENOUS | Status: AC
  Administered 2012-11-01: 19:00:00 via INTRAVENOUS
  Filled 2012-11-01: qty 1000

## 2012-11-01 MED ORDER — HYDROMORPHONE HCL PF 1 MG/ML IJ SOLN
1.0000 mg | Freq: Once | INTRAMUSCULAR | Status: AC
  Administered 2012-11-01: 1 mg via INTRAVENOUS
  Filled 2012-11-01: qty 1

## 2012-11-01 MED ORDER — POTASSIUM CHLORIDE 10 MEQ/100ML IV SOLN
10.0000 meq | INTRAVENOUS | Status: AC
  Administered 2012-11-01 (×2): 10 meq via INTRAVENOUS
  Filled 2012-11-01 (×2): qty 100

## 2012-11-01 MED ORDER — SODIUM CHLORIDE 0.9 % IV SOLN
Freq: Once | INTRAVENOUS | Status: AC
  Administered 2012-11-01: 20:00:00 via INTRAVENOUS

## 2012-11-01 NOTE — ED Notes (Signed)
Pt states she has been having abd pain, n/v/d since Saturday. Cannot keep anything down.

## 2012-11-01 NOTE — ED Notes (Signed)
Patient is alert and oriented x3.  She was given DC instructions and follow up visit instructions.  Patient gave verbal understanding. She was DC ambulatory under her own power to home.  V/S stable.  He was not showing any signs of distress on DC 

## 2012-11-01 NOTE — ED Provider Notes (Signed)
CSN: [id number]     Arrival date & time 11/01/12  1620 History     First MD Initiated Contact with Patient 11/01/12 1640     Chief Complaint  Patient presents with  . Abdominal Pain   (Consider location/radiation/quality/duration/timing/severity/associated sxs/prior Treatment) HPI Comments: Patient is a 47 year old female with history of recurrent abdominal pain who presents today with abdominal pain, nausea, vomiting, diarrhea, lightheadedness since Saturday. The pain has been gradually worsening since that time. Her diarrhea has been worsening since that time. The diarrhea is nonbloody and watery. She states she's had over 20 bowel movements today. This feels like her chronic abdominal pain. She has had more than 20 CT scans in the past 4 years. She has been evaluated for this pain frequently in the past month. She took a Tylenol and Imodium which did not help her pain. The pain is worse right before she has a bowel movement and then she reports a few seconds of relief after the bowel movement. Patient has a pain contract with her PCP. She is not to receive pain medication rx from any other source, but is able to receive pain medication in the emergency department.   Patient is a 47 y.o. female presenting with abdominal pain. The history is provided by the patient. No language interpreter was used.  Abdominal Pain Associated symptoms include abdominal pain, nausea and vomiting. Pertinent negatives include no chills or fever.    Past Medical History  Diagnosis Date  . Collagenous colitis   . WPW (Wolff-Parkinson-White syndrome)   . Kidney stone   . Addison disease   . Thyroid disease   . Clostridium difficile diarrhea   . Anxiety and depression   . Chronic diarrhea   . Chronic back pain   . Drug-seeking behavior   . Irritable bowel syndrome (IBS)   . Anemia   . Kidney stone   . Pancreatitis    Past Surgical History  Procedure Laterality Date  . Cholecystectomy    . Tubal  ligation    . Lithotripsy    . Sphincterotomy    . Cardiac electrophysiology mapping and ablation      for WPW  . Video bronchoscopy  08/31/2011    Procedure: VIDEO BRONCHOSCOPY WITH FLUORO;  Surgeon: Storm Frisk, MD;  Location: [facility];  Service: Cardiopulmonary;  Laterality: N/A;   Family History  Problem Relation Age of Onset  . Prostate cancer Father   . Coronary artery disease Maternal Grandfather    History  Substance Use Topics  . Smoking status: Current Every Day Smoker -- 0.50 packs/day for 30 years    Types: Cigarettes  . Smokeless tobacco: Never Used  . Alcohol Use: Yes     Comment: seldom   OB History   Grav Para Term Preterm Abortions TAB SAB Ect Mult Living   3 3             Review of Systems  Constitutional: Negative for fever and chills.  Gastrointestinal: Positive for nausea, vomiting, abdominal pain and diarrhea. Negative for abdominal distention.  All other systems reviewed and are negative.    Allergies  Prednisone; Ambien; Nsaids; and Zofran  Home Medications   Current Outpatient Rx  Name  Route  Sig  Dispense  Refill  . Budesonide 9 MG TB24   Oral   Take 9 mg by mouth daily. Prescription provided by  GI.         . hydrocortisone (CORTEF) 10 MG tablet   Oral  Take 10 mg by mouth daily.         Marland Kitchen levothyroxine (SYNTHROID, LEVOTHROID) 50 MCG tablet   Oral   Take 50 mcg by mouth daily.         Marland Kitchen loperamide (IMODIUM) 2 MG capsule   Oral   Take 2 mg by mouth 4 (four) times daily as needed for diarrhea or loose stools.         Marland Kitchen LORazepam (ATIVAN) 1 MG tablet   Oral   Take 0.5 tablets (0.5 mg total) by mouth every 8 (eight) hours as needed.   15 tablet   0   . Multiple Vitamin (MULITIVITAMIN WITH MINERALS) TABS   Oral   Take 1 tablet by mouth daily.          . potassium chloride (K-DUR) 10 MEQ tablet   Oral   Take 1 tablet (10 mEq total) by mouth 2 (two) times daily.   20 tablet   0   . promethazine  (PHENERGAN) 25 MG tablet   Oral   Take 25 mg by mouth every 6 (six) hours as needed for nausea.         . Vitamin D, Ergocalciferol, (DRISDOL) 50000 UNITS CAPS   Oral   Take 50,000 Units by mouth every Monday. Take on monday          BP 113/73  Pulse 98  Temp(Src) 98.5 F (36.9 C) (Oral)  Resp 20  SpO2 99%  LMP 09/16/2012 Physical Exam  Nursing note and vitals reviewed. Constitutional: She is oriented to person, place, and time. She appears well-developed and well-nourished. No distress.  HENT:  Head: Normocephalic and atraumatic.  Right Ear: External ear normal.  Left Ear: External ear normal.  Nose: Nose normal.  Mouth/Throat: Oropharynx is clear and moist.  Eyes: Conjunctivae are normal.  Neck: Normal range of motion.  Cardiovascular: Normal rate, regular rhythm and normal heart sounds.   Pulmonary/Chest: Effort normal and breath sounds normal. No stridor. No respiratory distress. She has no wheezes. She has no rales.  Abdominal: Soft. Normal appearance. She exhibits no distension. There is tenderness in the right upper quadrant and right lower quadrant. There is guarding. There is no rigidity and no rebound.    Musculoskeletal: Normal range of motion.  Neurological: She is alert and oriented to person, place, and time. She has normal strength.  Skin: Skin is warm and dry. She is not diaphoretic. No erythema.  Psychiatric: She has a normal mood and affect. Her behavior is normal.    ED Course   Procedures (including critical care time)  Labs Reviewed  URINALYSIS, ROUTINE W REFLEX MICROSCOPIC - Abnormal; Notable for the following:    Color, Urine AMBER (*)    APPearance CLOUDY (*)    Specific Gravity, Urine 1.031 (*)    Bilirubin Urine SMALL (*)    Protein, ur 100 (*)    Leukocytes, UA SMALL (*)    All other components within normal limits  CBC WITH DIFFERENTIAL - Abnormal; Notable for the following:    Hemoglobin 15.9 (*)    MCV 103.5 (*)    MCH 36.6 (*)      All other components within normal limits  COMPREHENSIVE METABOLIC PANEL - Abnormal; Notable for the following:    Potassium 2.1 (*)    Glucose, Bld 114 (*)    Creatinine, Ser 1.23 (*)    Alkaline Phosphatase 170 (*)    Total Bilirubin 0.2 (*)    GFR calc non  Af Amer 51 (*)    GFR calc Af Amer 60 (*)    All other components within normal limits  URINE MICROSCOPIC-ADD ON - Abnormal; Notable for the following:    Squamous Epithelial / LPF MANY (*)    Bacteria, UA MANY (*)    Casts HYALINE CASTS (*)    All other components within normal limits  POCT I-STAT, CHEM 8 - Abnormal; Notable for the following:    Potassium 2.9 (*)    All other components within normal limits  URINE CULTURE  LIPASE, BLOOD  POCT PREGNANCY, URINE   No results found. 1. Abdominal pain   2. Emesis   3. Diarrhea     MDM  Patient presents with chronic abdominal pain, emesis, diarrhea. It was found that her potassium was 2.1. Potassium was replaced both IV and by mouth. Symptoms improved with Dilaudid and Phenergan. Patient has potassium that she takes at home. Patient feels safe to go home on her potassium. She will call her GI doctor tomorrow. Abdomen is soft and nonsurgical at time of discharge. Dr. Ranae Palms evaluated patient and agrees with plan. Return instrcutions given. Vital signs stable for discharge. Patient / Family / Caregiver informed of clinical course, understand medical decision-making process, and agree with plan.   Medications  sodium chloride 0.9 % bolus 1,000 mL (0 mLs Intravenous Stopped 11/01/12 1903)  promethazine (PHENERGAN) injection 25 mg (25 mg Intravenous Given 11/01/12 1734)  0.9 % NaCl with KCl 20 mEq/ L  infusion ( Intravenous Stopped 11/01/12 1918)  potassium chloride SA (K-DUR,KLOR-CON) CR tablet 40 mEq (40 mEq Oral Given 11/01/12 1901)  HYDROmorphone (DILAUDID) injection 1 mg (1 mg Intravenous Given 11/01/12 1916)  potassium chloride 10 mEq in 100 mL IVPB (0 mEq Intravenous Stopped  11/01/12 2203)  0.9 %  sodium chloride infusion ( Intravenous Stopped 11/01/12 2240)  metoCLOPramide (REGLAN) injection 10 mg (10 mg Intravenous Given 11/01/12 1955)  promethazine (PHENERGAN) injection 25 mg (25 mg Intravenous Given 11/01/12 2240)  potassium chloride SA (K-DUR,KLOR-CON) CR tablet 40 mEq (40 mEq Oral Given 11/01/12 2257)     Mora Bellman, PA-C 11/02/12 0126

## 2012-11-02 ENCOUNTER — Encounter (HOSPITAL_BASED_OUTPATIENT_CLINIC_OR_DEPARTMENT_OTHER): Payer: Self-pay | Admitting: *Deleted

## 2012-11-02 ENCOUNTER — Emergency Department (HOSPITAL_BASED_OUTPATIENT_CLINIC_OR_DEPARTMENT_OTHER)
Admission: EM | Admit: 2012-11-02 | Discharge: 2012-11-02 | Disposition: A | Discharge status: Home / Self Care | Payer: BC Managed Care – PPO | Attending: Emergency Medicine | Admitting: Emergency Medicine

## 2012-11-02 DIAGNOSIS — Z8619 Personal history of other infectious and parasitic diseases: Secondary | ICD-10-CM | POA: Insufficient documentation

## 2012-11-02 DIAGNOSIS — Z79899 Other long term (current) drug therapy: Secondary | ICD-10-CM | POA: Insufficient documentation

## 2012-11-02 DIAGNOSIS — Z9889 Other specified postprocedural states: Secondary | ICD-10-CM | POA: Insufficient documentation

## 2012-11-02 DIAGNOSIS — F341 Dysthymic disorder: Secondary | ICD-10-CM | POA: Insufficient documentation

## 2012-11-02 DIAGNOSIS — Y9389 Activity, other specified: Secondary | ICD-10-CM | POA: Insufficient documentation

## 2012-11-02 DIAGNOSIS — IMO0002 Reserved for concepts with insufficient information to code with codable children: Secondary | ICD-10-CM | POA: Insufficient documentation

## 2012-11-02 DIAGNOSIS — S161XXA Strain of muscle, fascia and tendon at neck level, initial encounter: Secondary | ICD-10-CM

## 2012-11-02 DIAGNOSIS — G8929 Other chronic pain: Secondary | ICD-10-CM | POA: Insufficient documentation

## 2012-11-02 DIAGNOSIS — E2749 Other adrenocortical insufficiency: Secondary | ICD-10-CM | POA: Insufficient documentation

## 2012-11-02 DIAGNOSIS — S0990XA Unspecified injury of head, initial encounter: Secondary | ICD-10-CM

## 2012-11-02 DIAGNOSIS — S139XXA Sprain of joints and ligaments of unspecified parts of neck, initial encounter: Secondary | ICD-10-CM | POA: Insufficient documentation

## 2012-11-02 DIAGNOSIS — Z87442 Personal history of urinary calculi: Secondary | ICD-10-CM | POA: Insufficient documentation

## 2012-11-02 DIAGNOSIS — Z8719 Personal history of other diseases of the digestive system: Secondary | ICD-10-CM | POA: Insufficient documentation

## 2012-11-02 DIAGNOSIS — W010XXA Fall on same level from slipping, tripping and stumbling without subsequent striking against object, initial encounter: Secondary | ICD-10-CM | POA: Insufficient documentation

## 2012-11-02 DIAGNOSIS — W1809XA Striking against other object with subsequent fall, initial encounter: Secondary | ICD-10-CM | POA: Insufficient documentation

## 2012-11-02 DIAGNOSIS — Z765 Malingerer [conscious simulation]: Secondary | ICD-10-CM | POA: Insufficient documentation

## 2012-11-02 DIAGNOSIS — Y9229 Other specified public building as the place of occurrence of the external cause: Secondary | ICD-10-CM | POA: Insufficient documentation

## 2012-11-02 DIAGNOSIS — I456 Pre-excitation syndrome: Secondary | ICD-10-CM | POA: Insufficient documentation

## 2012-11-02 DIAGNOSIS — F17200 Nicotine dependence, unspecified, uncomplicated: Secondary | ICD-10-CM | POA: Insufficient documentation

## 2012-11-02 NOTE — ED Provider Notes (Signed)
CSN: [id number]     Arrival date & time 11/02/12  1338 History     First MD Initiated Contact with Patient 11/02/12 1500     Chief Complaint  Patient presents with  . Fall   (Consider location/radiation/quality/duration/timing/severity/associated sxs/prior Treatment) HPI Comments: Pt slipped in garage on wet floor, landed on back of head, also with right posterior neck pain, worse with movement, HA is primarily on right posterior and right anterior head.  No LOC, no N/V, no confusion, no focal numbness or weakness.  No seizure like activity.  No difficulty with bowel or bladder.  Pt also has some pain to right lower back and flank area.  No hematuria.    Patient is a 47 y.o. female presenting with fall. The history is provided by the patient.  Fall This is a new problem. The current episode started 3 to 5 hours ago. The problem occurs constantly. Associated symptoms include headaches. Pertinent negatives include no chest pain, no abdominal pain and no shortness of breath. Nothing aggravates the symptoms. Nothing relieves the symptoms. She has tried nothing for the symptoms.    Past Medical History  Diagnosis Date  . Collagenous colitis   . WPW (Wolff-Parkinson-White syndrome)   . Kidney stone   . Addison disease   . Thyroid disease   . Clostridium difficile diarrhea   . Anxiety and depression   . Chronic diarrhea   . Chronic back pain   . Drug-seeking behavior   . Irritable bowel syndrome (IBS)   . Anemia   . Kidney stone   . Pancreatitis    Past Surgical History  Procedure Laterality Date  . Cholecystectomy    . Tubal ligation    . Lithotripsy    . Sphincterotomy    . Cardiac electrophysiology mapping and ablation      for WPW  . Video bronchoscopy  08/31/2011    Procedure: VIDEO BRONCHOSCOPY WITH FLUORO;  Surgeon: Storm Frisk, MD;  Location: [facility];  Service: Cardiopulmonary;  Laterality: N/A;   Family History  Problem Relation Age of Onset  . Prostate  cancer Father   . Coronary artery disease Maternal Grandfather    History  Substance Use Topics  . Smoking status: Current Every Day Smoker -- 0.50 packs/day for 30 years    Types: Cigarettes  . Smokeless tobacco: Never Used  . Alcohol Use: Yes     Comment: seldom   OB History   Grav Para Term Preterm Abortions TAB SAB Ect Mult Living   3 3             Review of Systems  HENT: Positive for neck pain. Negative for neck stiffness.   Eyes: Negative for visual disturbance.  Respiratory: Negative for shortness of breath.   Cardiovascular: Negative for chest pain.  Gastrointestinal: Negative for nausea, vomiting and abdominal pain.  Genitourinary: Positive for flank pain.  Musculoskeletal: Positive for back pain.  Skin: Negative for wound.  Neurological: Positive for headaches. Negative for dizziness, seizures, syncope and weakness.    Allergies  Prednisone; Ambien; Nsaids; and Zofran  Home Medications   Current Outpatient Rx  Name  Route  Sig  Dispense  Refill  . Budesonide 9 MG TB24   Oral   Take 9 mg by mouth daily. Prescription provided by [facility].         . hydrocortisone (CORTEF) 10 MG tablet   Oral   Take 10 mg by mouth daily.         Marland Kitchen  levothyroxine (SYNTHROID, LEVOTHROID) 50 MCG tablet   Oral   Take 50 mcg by mouth daily.         Marland Kitchen loperamide (IMODIUM) 2 MG capsule   Oral   Take 2 mg by mouth 4 (four) times daily as needed for diarrhea or loose stools.         Marland Kitchen LORazepam (ATIVAN) 1 MG tablet   Oral   Take 0.5 tablets (0.5 mg total) by mouth every 8 (eight) hours as needed.   15 tablet   0   . Multiple Vitamin (MULITIVITAMIN WITH MINERALS) TABS   Oral   Take 1 tablet by mouth daily.          . potassium chloride (K-DUR) 10 MEQ tablet   Oral   Take 1 tablet (10 mEq total) by mouth 2 (two) times daily.   20 tablet   0   . promethazine (PHENERGAN) 25 MG tablet   Oral   Take 25 mg by mouth every 6 (six) hours as needed for nausea.          . promethazine (PHENERGAN) 25 MG tablet   Oral   Take 1 tablet (25 mg total) by mouth every 6 (six) hours as needed for nausea.   24 tablet   0   . Vitamin D, Ergocalciferol, (DRISDOL) 50000 UNITS CAPS   Oral   Take 50,000 Units by mouth every Monday. Take on monday          BP 103/82  Pulse 71  Temp(Src) 97.7 F (36.5 C) (Oral)  Resp 16  Ht 5\' 4"  (1.626 m)  Wt 133 lb (60.328 kg)  BMI 22.82 kg/m2  SpO2 100%  LMP 09/16/2012 Physical Exam  Nursing note and vitals reviewed. Constitutional: She is oriented to person, place, and time. She appears well-developed and well-nourished. No distress.  HENT:  Head: Normocephalic and atraumatic.  Eyes: Conjunctivae and EOM are normal. Pupils are equal, round, and reactive to light. No scleral icterus.  Neck: Normal range of motion. Neck supple. Muscular tenderness present. No spinous process tenderness present.    Cardiovascular: Normal rate and intact distal pulses.   Pulmonary/Chest: Effort normal. No respiratory distress.  Abdominal: Soft. There is no tenderness.  Musculoskeletal:       Cervical back: She exhibits no bony tenderness.       Lumbar back: She exhibits pain. She exhibits normal range of motion, no tenderness, no bony tenderness, no swelling, no edema, no deformity, no laceration, no spasm and normal pulse.       Back:  Neurological: She is alert and oriented to person, place, and time. No cranial nerve deficit. She exhibits normal muscle tone. Coordination normal.  Skin: Skin is warm and dry. No rash noted. She is not diaphoretic. No pallor.    ED Course   Procedures (including critical care time)  Labs Reviewed - No data to display No results found. 1. Head injury, acute, initial encounter   2. Cervical strain, acute, initial encounter    ra sat is 100% and I interpret to be normal   MDM  Pt with no LOC, not on blood thinners, no focal neuro deficits.  No need for head CT here.  Pt with muscular pain  to cervical and lumbar regions, no need for plain films or CT.  Pt is reassured, is given return precaution advice regarding injuries.  Pt declines any pain meds here or presription (pt has h/o chronic pain already).  Pt is told to  seek help with sudden worsening HA, persistent vomiting, confusion, lethargy.    Gavin Pound. Suann Klier, MD 11/02/12 1520

## 2012-11-02 NOTE — ED Notes (Signed)
Slipped on wet floor and fell. Hit the back of her head on concrete. No LOC. No laceration. Lower back and neck pain.

## 2012-11-02 NOTE — Discharge Instructions (Signed)
Cervical Sprain  A cervical sprain is an injury in the neck in which the ligaments are stretched or torn. The ligaments are the tissues that hold the bones of the neck (vertebrae) in place.Cervical sprains can range from very mild to very severe. Most cervical sprains get better in 1 to 3 weeks, but it depends on the cause and extent of the injury. Severe cervical sprains can cause the neck vertebrae to be unstable. This can lead to damage of the spinal cord and can result in serious nervous system problems. Your caregiver will determine whether your cervical sprain is mild or severe.  CAUSES   Severe cervical sprains may be caused by:   Contact sport injuries (football, rugby, wrestling, hockey, auto racing, gymnastics, diving, martial arts, boxing).   Motor vehicle collisions.   Whiplash injuries. This means the neck is forcefully whipped backward and forward.   Falls.  Mild cervical sprains may be caused by:    Awkward positions, such as cradling a telephone between your ear and shoulder.   Sitting in a chair that does not offer proper support.   Working at a poorly designed computer station.   Activities that require looking up or down for long periods of time.  SYMPTOMS    Pain, soreness, stiffness, or a burning sensation in the front, back, or sides of the neck. This discomfort may develop immediately after injury or it may develop slowly and not begin for 24 hours or more after an injury.   Pain or tenderness directly in the middle of the back of the neck.   Shoulder or upper back pain.   Limited ability to move the neck.   Headache.   Dizziness.   Weakness, numbness, or tingling in the hands or arms.   Muscle spasms.   Difficulty swallowing or chewing.   Tenderness and swelling of the neck.  DIAGNOSIS   Most of the time, your caregiver can diagnose this problem by taking your history and doing a physical exam. Your caregiver will ask about any known problems, such as arthritis in the neck  or a previous neck injury. X-rays may be taken to find out if there are any other problems, such as problems with the bones of the neck. However, an X-ray often does not reveal the full extent of a cervical sprain. Other tests such as a computed tomography (CT) scan or magnetic resonance imaging (MRI) may be needed.  TREATMENT   Treatment depends on the severity of the cervical sprain. Mild sprains can be treated with rest, keeping the neck in place (immobilization), and pain medicines. Severe cervical sprains need immediate immobilization and an appointment with an orthopedist or neurosurgeon. Several treatment options are available to help with pain, muscle spasms, and other symptoms. Your caregiver may prescribe:   Medicines, such as pain relievers, numbing medicines, or muscle relaxants.   Physical therapy. This can include stretching exercises, strengthening exercises, and posture training. Exercises and improved posture can help stabilize the neck, strengthen muscles, and help stop symptoms from returning.   A neck collar to be worn for short periods of time. Often, these collars are worn for comfort. However, certain collars may be worn to protect the neck and prevent further worsening of a serious cervical sprain.  HOME CARE INSTRUCTIONS    Put ice on the injured area.   Put ice in a plastic bag.   Place a towel between your skin and the bag.   Leave the ice on for 15-20   minutes, 3-4 times a day.   Only take over-the-counter or prescription medicines for pain, discomfort, or fever as directed by your caregiver.   Keep all follow-up appointments as directed by your caregiver.   Keep all physical therapy appointments as directed by your caregiver.   If a neck collar is prescribed, wear it as directed by your caregiver.   Do not drive while wearing a neck collar.   Make any needed adjustments to your work station to promote good posture.   Avoid positions and activities that make your symptoms  worse.   Warm up and stretch before being active to help prevent problems.  SEEK MEDICAL CARE IF:    Your pain is not controlled with medicine.   You are unable to decrease your pain medicine over time as planned.   Your activity level is not improving as expected.  SEEK IMMEDIATE MEDICAL CARE IF:    You develop any bleeding, stomach upset, or signs of an allergic reaction to your medicine.   Your symptoms get worse.   You develop new, unexplained symptoms.   You have numbness, tingling, weakness, or paralysis in any part of your body.  MAKE SURE YOU:    Understand these instructions.   Will watch your condition.   Will get help right away if you are not doing well or get worse.  Document Released: 01/16/2007 Document Revised: 06/13/2011 Document Reviewed: 12/22/2010  [employer] Patient Information 2014 [employer]          Head Injury, Adult  You have had a head injury that does not appear serious at this time. A concussion is a state of changed mental ability, usually from a blow to the head. You should take clear liquids for the rest of the day and then resume your regular diet. You should not take sedatives or alcoholic beverages for as long as directed by your caregiver after discharge. After injuries such as yours, most problems occur within the first 24 hours.  SYMPTOMS  These minor symptoms may be experienced after discharge:   Memory difficulties.   Dizziness.   Headaches.   Double vision.   Hearing difficulties.   Depression.   Tiredness.   Weakness.   Difficulty with concentration.  If you experience any of these problems, you should not be alarmed. A concussion requires a few days for recovery. Many patients with head injuries frequently experience such symptoms. Usually, these problems disappear without medical care. If symptoms last for more than one day, notify your caregiver. See your caregiver sooner if symptoms are becoming worse rather than better.  HOME CARE INSTRUCTIONS     During the next 24 hours you must stay with someone who can watch you for the warning signs listed below.  Although it is unlikely that serious side effects will occur, you should be aware of signs and symptoms which may necessitate your return to this location. Side effects may occur up to 7  10 days following the injury. It is important for you to carefully monitor your condition and contact your caregiver or seek immediate medical attention if there is a change in your condition.  SEEK IMMEDIATE MEDICAL CARE IF:    There is confusion or drowsiness.   You can not awaken the injured person.   There is nausea (feeling sick to your stomach) or continued, forceful vomiting.   You notice dizziness or unsteadiness which is getting worse, or inability to walk.   You have convulsions or unconsciousness.     You experience severe, persistent headaches not relieved by over-the-counter or prescription medicines for pain. (Do not take aspirin as this impairs clotting abilities). Take other pain medications only as directed.   You can not use arms or legs normally.   There is clear or bloody discharge from the nose or ears.  MAKE SURE YOU:    Understand these instructions.   Will watch your condition.   Will get help right away if you are not doing well or get worse.  Document Released: 03/21/2005 Document Revised: 06/13/2011 Document Reviewed: 02/06/2009  [employer] Patient Information 2014 [employer]

## 2012-11-02 NOTE — ED Notes (Addendum)
Pt states that she was walking out of her house and slipped on the concrete.  C/o back of the head, right side of neck, and right side of lower back pain and tenderness.  Also c/o right eye orbital pain.  Pt denies loss of consciousness or vomiting but reports being extremely dizzy right after impact.  Pt also reports some nausea but is not sure that it is related to her fall. Pt in NAD, AAOx4.

## 2012-11-03 LAB — URINE CULTURE

## 2012-11-04 NOTE — ED Provider Notes (Signed)
Medical screening examination/treatment/procedure(s) were conducted as a shared visit with non-physician practitioner(s) and myself.  I personally evaluated the patient during the encounter   Loren Racer, MD 11/04/12 [phone]

## 2013-02-07 ENCOUNTER — Other Ambulatory Visit: Payer: Self-pay

## 2013-03-06 ENCOUNTER — Emergency Department (HOSPITAL_BASED_OUTPATIENT_CLINIC_OR_DEPARTMENT_OTHER)
Admission: EM | Admit: 2013-03-06 | Discharge: 2013-03-06 | Disposition: A | Discharge status: Home / Self Care | Payer: BC Managed Care – PPO | Attending: Emergency Medicine | Admitting: Emergency Medicine

## 2013-03-06 ENCOUNTER — Encounter (HOSPITAL_BASED_OUTPATIENT_CLINIC_OR_DEPARTMENT_OTHER): Payer: Self-pay | Admitting: Emergency Medicine

## 2013-03-06 DIAGNOSIS — Z9889 Other specified postprocedural states: Secondary | ICD-10-CM | POA: Insufficient documentation

## 2013-03-06 DIAGNOSIS — IMO0002 Reserved for concepts with insufficient information to code with codable children: Secondary | ICD-10-CM | POA: Insufficient documentation

## 2013-03-06 DIAGNOSIS — K529 Noninfective gastroenteritis and colitis, unspecified: Secondary | ICD-10-CM

## 2013-03-06 DIAGNOSIS — E079 Disorder of thyroid, unspecified: Secondary | ICD-10-CM | POA: Insufficient documentation

## 2013-03-06 DIAGNOSIS — K5289 Other specified noninfective gastroenteritis and colitis: Secondary | ICD-10-CM | POA: Insufficient documentation

## 2013-03-06 DIAGNOSIS — G8929 Other chronic pain: Secondary | ICD-10-CM | POA: Insufficient documentation

## 2013-03-06 DIAGNOSIS — R630 Anorexia: Secondary | ICD-10-CM | POA: Insufficient documentation

## 2013-03-06 DIAGNOSIS — R1110 Vomiting, unspecified: Secondary | ICD-10-CM

## 2013-03-06 DIAGNOSIS — Z87442 Personal history of urinary calculi: Secondary | ICD-10-CM | POA: Insufficient documentation

## 2013-03-06 DIAGNOSIS — M549 Dorsalgia, unspecified: Secondary | ICD-10-CM | POA: Insufficient documentation

## 2013-03-06 DIAGNOSIS — F17200 Nicotine dependence, unspecified, uncomplicated: Secondary | ICD-10-CM | POA: Insufficient documentation

## 2013-03-06 DIAGNOSIS — E876 Hypokalemia: Secondary | ICD-10-CM | POA: Insufficient documentation

## 2013-03-06 DIAGNOSIS — R197 Diarrhea, unspecified: Secondary | ICD-10-CM | POA: Insufficient documentation

## 2013-03-06 DIAGNOSIS — R112 Nausea with vomiting, unspecified: Secondary | ICD-10-CM | POA: Insufficient documentation

## 2013-03-06 DIAGNOSIS — F341 Dysthymic disorder: Secondary | ICD-10-CM | POA: Insufficient documentation

## 2013-03-06 LAB — COMPREHENSIVE METABOLIC PANEL WITH GFR
ALT: 23 U/L (ref 0–35)
AST: 27 U/L (ref 0–37)
Albumin: 4 g/dL (ref 3.5–5.2)
Alkaline Phosphatase: 147 U/L — ABNORMAL HIGH (ref 39–117)
BUN: 14 mg/dL (ref 6–23)
CO2: 24 meq/L (ref 19–32)
Calcium: 9.4 mg/dL (ref 8.4–10.5)
Chloride: 103 meq/L (ref 96–112)
Creatinine, Ser: 0.7 mg/dL (ref 0.50–1.10)
GFR calc Af Amer: >90 mL/min
GFR calc non Af Amer: >90 mL/min
Glucose, Bld: 97 mg/dL (ref 70–99)
Potassium: 3 meq/L — ABNORMAL LOW (ref 3.5–5.1)
Sodium: 139 meq/L (ref 135–145)
Total Bilirubin: 0.1 mg/dL — ABNORMAL LOW (ref 0.3–1.2)
Total Protein: 7.3 g/dL (ref 6.0–8.3)

## 2013-03-06 LAB — URINALYSIS, ROUTINE W REFLEX MICROSCOPIC
Bilirubin Urine: NEGATIVE
Glucose, UA: NEGATIVE mg/dL
Hgb urine dipstick: NEGATIVE
Ketones, ur: NEGATIVE mg/dL
Leukocytes, UA: NEGATIVE
Nitrite: NEGATIVE
Protein, ur: NEGATIVE mg/dL
Specific Gravity, Urine: 1.021 (ref 1.005–1.030)
Urobilinogen, UA: 0.2 mg/dL (ref 0.0–1.0)
pH: 6 (ref 5.0–8.0)

## 2013-03-06 LAB — CBC WITH DIFFERENTIAL/PLATELET
Eosinophils Absolute: 0.2 10*3/uL (ref 0.0–0.7)
HCT: 39.7 % (ref 36.0–46.0)
Hemoglobin: 13.6 g/dL (ref 12.0–15.0)
Lymphs Abs: 2.4 10*3/uL (ref 0.7–4.0)
MCH: 35.6 pg — ABNORMAL HIGH (ref 26.0–34.0)
Monocytes Absolute: 0.5 10*3/uL (ref 0.1–1.0)
Monocytes Relative: 6 % (ref 3–12)
Neutrophils Relative %: 61 % (ref 43–77)
RBC: 3.82 MIL/uL — ABNORMAL LOW (ref 3.87–5.11)

## 2013-03-06 LAB — LIPASE, BLOOD: Lipase: 16 U/L (ref 11–59)

## 2013-03-06 MED ORDER — POTASSIUM CHLORIDE CRYS ER 20 MEQ PO TBCR: 40.0000 meq | EXTENDED_RELEASE_TABLET | Freq: Every day | ORAL | Status: DC

## 2013-03-06 MED ORDER — SODIUM CHLORIDE 0.9 % IV BOLUS (SEPSIS)
1000.0000 mL | Freq: Once | INTRAVENOUS | Status: AC
  Administered 2013-03-06: 1000 mL via INTRAVENOUS

## 2013-03-06 MED ORDER — PROMETHAZINE HCL 25 MG/ML IJ SOLN
25.0000 mg | Freq: Once | INTRAMUSCULAR | Status: AC
  Administered 2013-03-06: 25 mg via INTRAVENOUS
  Filled 2013-03-06: qty 1

## 2013-03-06 MED ORDER — LORAZEPAM 2 MG PO TABS: 2.0000 mg | ORAL_TABLET | Freq: Three times a day (TID) | ORAL | Status: DC | PRN

## 2013-03-06 MED ORDER — OXYCODONE-ACETAMINOPHEN 5-325 MG PO TABS: 1.0000 | ORAL_TABLET | Freq: Four times a day (QID) | ORAL | Status: DC | PRN

## 2013-03-06 MED ORDER — POTASSIUM CHLORIDE 10 MEQ/100ML IV SOLN
10.0000 meq | Freq: Once | INTRAVENOUS | Status: AC
  Administered 2013-03-06: 10 meq via INTRAVENOUS
  Filled 2013-03-06: qty 100

## 2013-03-06 MED ORDER — DIPHENOXYLATE-ATROPINE 2.5-0.025 MG PO TABS: 1.0000 | ORAL_TABLET | Freq: Four times a day (QID) | ORAL | Status: DC | PRN

## 2013-03-06 MED ORDER — HYDROMORPHONE HCL PF 1 MG/ML IJ SOLN
1.0000 mg | Freq: Once | INTRAMUSCULAR | Status: AC
  Administered 2013-03-06: 1 mg via INTRAVENOUS
  Filled 2013-03-06: qty 1

## 2013-03-06 MED ORDER — LORAZEPAM 1 MG PO TABS
2.0000 mg | ORAL_TABLET | Freq: Once | ORAL | Status: AC
  Administered 2013-03-06: 2 mg via ORAL
  Filled 2013-03-06: qty 2

## 2013-03-06 MED ORDER — OXYCODONE-ACETAMINOPHEN 5-325 MG PO TABS
1.0000 | ORAL_TABLET | Freq: Once | ORAL | Status: AC
  Administered 2013-03-06: 1 via ORAL
  Filled 2013-03-06: qty 1

## 2013-03-06 NOTE — ED Provider Notes (Signed)
CSN: [id number]     Arrival date & time 03/06/13  1724 History   First MD Initiated Contact with Patient 03/06/13 1752     Chief Complaint  Patient presents with  . Abdominal Pain   (Consider location/radiation/quality/duration/timing/severity/associated sxs/prior Treatment) Patient is a 47 y.o. female presenting with abdominal pain. The history is provided by the patient.  Abdominal Pain Pain location:  RLQ Pain quality: sharp and stabbing   Pain radiates to:  Does not radiate Pain severity:  Severe Onset quality:  Gradual Duration:  3 days Timing:  Constant Progression:  Worsening Chronicity:  Recurrent Context: previous surgery   Context: not alcohol use, not diet changes, not laxative use, not recent illness, not sick contacts, not suspicious food intake and not trauma   Context comment:  Hx of colitis with intermittent flares that feel just like this Relieved by:  Nothing Worsened by:  Eating Ineffective treatments:  None tried Associated symptoms: anorexia, diarrhea, nausea and vomiting   Associated symptoms: no chest pain, no chills, no dysuria, no fever and no shortness of breath   Associated symptoms comment:  Lightheaded and leg cramping Risk factors: multiple surgeries   Risk factors: no alcohol abuse, no aspirin use and no NSAID use     Past Medical History  Diagnosis Date  . Collagenous colitis   . WPW (Wolff-Parkinson-White syndrome)   . Kidney stone   . Addison disease   . Thyroid disease   . Clostridium difficile diarrhea   . Anxiety and depression   . Chronic diarrhea   . Chronic back pain   . Drug-seeking behavior   . Irritable bowel syndrome (IBS)   . Anemia   . Kidney stone   . Pancreatitis    Past Surgical History  Procedure Laterality Date  . Cholecystectomy    . Tubal ligation    . Lithotripsy    . Sphincterotomy    . Cardiac electrophysiology mapping and ablation      for WPW  . Video bronchoscopy  08/31/2011    Procedure: VIDEO  BRONCHOSCOPY WITH FLUORO;  Surgeon: Storm Frisk, MD;  Location: [facility];  Service: Cardiopulmonary;  Laterality: N/A;   Family History  Problem Relation Age of Onset  . Prostate cancer Father   . Coronary artery disease Maternal Grandfather    History  Substance Use Topics  . Smoking status: Current Every Day Smoker -- 0.50 packs/day for 30 years    Types: Cigarettes  . Smokeless tobacco: Never Used  . Alcohol Use: Yes     Comment: seldom   OB History   Grav Para Term Preterm Abortions TAB SAB Ect Mult Living   3 3             Review of Systems  Constitutional: Negative for fever and chills.  Respiratory: Negative for shortness of breath.   Cardiovascular: Negative for chest pain.  Gastrointestinal: Positive for nausea, vomiting, abdominal pain, diarrhea and anorexia.  Genitourinary: Negative for dysuria.  All other systems reviewed and are negative.    Allergies  Prednisone; Ambien; Nsaids; and Zofran  Home Medications   Current Outpatient Rx  Name  Route  Sig  Dispense  Refill  . Budesonide 9 MG TB24   Oral   Take 9 mg by mouth daily. Prescription provided by  GI.         . hydrocortisone (CORTEF) 10 MG tablet   Oral   Take 10 mg by mouth daily.         Marland Kitchen  levothyroxine (SYNTHROID, LEVOTHROID) 50 MCG tablet   Oral   Take 50 mcg by mouth daily.         Marland Kitchen loperamide (IMODIUM) 2 MG capsule   Oral   Take 2 mg by mouth 4 (four) times daily as needed for diarrhea or loose stools.         Marland Kitchen LORazepam (ATIVAN) 1 MG tablet   Oral   Take 0.5 tablets (0.5 mg total) by mouth every 8 (eight) hours as needed.   15 tablet   0   . Multiple Vitamin (MULITIVITAMIN WITH MINERALS) TABS   Oral   Take 1 tablet by mouth daily.          . potassium chloride (K-DUR) 10 MEQ tablet   Oral   Take 1 tablet (10 mEq total) by mouth 2 (two) times daily.   20 tablet   0   . promethazine (PHENERGAN) 25 MG tablet   Oral   Take 25 mg by mouth every 6  (six) hours as needed for nausea.         . promethazine (PHENERGAN) 25 MG tablet   Oral   Take 1 tablet (25 mg total) by mouth every 6 (six) hours as needed for nausea.   24 tablet   0   . Vitamin D, Ergocalciferol, (DRISDOL) 50000 UNITS CAPS   Oral   Take 50,000 Units by mouth every Monday. Take on monday          BP 120/72  Pulse 84  Temp(Src) 98.2 F (36.8 C) (Oral)  Resp 18  Ht 5\' 4"  (1.626 m)  Wt 112 lb (50.803 kg)  BMI 19.22 kg/m2  SpO2 99%  LMP 09/16/2012 Physical Exam  Nursing note and vitals reviewed. Constitutional: She is oriented to person, place, and time. She appears well-developed and well-nourished. No distress.  HENT:  Head: Normocephalic and atraumatic.  Mouth/Throat: Oropharynx is clear and moist. Mucous membranes are dry.  Eyes: Conjunctivae and EOM are normal. Pupils are equal, round, and reactive to light.  Neck: Normal range of motion. Neck supple.  Cardiovascular: Normal rate, regular rhythm and intact distal pulses.   No murmur heard. Pulmonary/Chest: Effort normal and breath sounds normal. No respiratory distress. She has no wheezes. She has no rales.  Abdominal: Soft. She exhibits no distension. There is tenderness in the right lower quadrant. There is guarding. There is no rebound and no CVA tenderness.    Musculoskeletal: Normal range of motion. She exhibits no edema and no tenderness.  Neurological: She is alert and oriented to person, place, and time.  Skin: Skin is warm and dry. No rash noted. No erythema.  Psychiatric: She has a normal mood and affect. Her behavior is normal.    ED Course  Procedures (including critical care time) Labs Review Labs Reviewed  URINALYSIS, ROUTINE W REFLEX MICROSCOPIC - Abnormal; Notable for the following:    APPearance CLOUDY (*)    All other components within normal limits  CBC WITH DIFFERENTIAL - Abnormal; Notable for the following:    RBC 3.82 (*)    MCV 103.9 (*)    MCH 35.6 (*)    All other  components within normal limits  COMPREHENSIVE METABOLIC PANEL - Abnormal; Notable for the following:    Potassium 3.0 (*)    Alkaline Phosphatase 147 (*)    Total Bilirubin 0.1 (*)    All other components within normal limits  LIPASE, BLOOD  LACTIC ACID, PLASMA  CG4 I-STAT (LACTIC ACID)  Imaging Review No results found.  EKG Interpretation   None       MDM   1. Colitis   2. Hypokalemia   3. Vomiting and diarrhea     Patient here with a history of colitis today complaining of a flare. For the last 3 days patient has had vomiting and diarrhea greater than 7 episodes of vomiting in 24 hours for more than 15-20 stools. She states that she feels that her potassium may be low because she's having leg cramps and feeling dizzy when standing.  Pt denies chest pain or SOB.  Pain in the right lower abd is her typical colitis flare.  She denies fever, abx or other risk factors.  Pt states currently she is on no meds and has not had a colitis flare in some time.  On exam pt appear dehydrated but has normal VS and RLQ pain.  CBC, CMP, lipase, lactate, UA pending.  7:04 PM Mild hypokalemia of 3.0 but rest of labs wnl.  Pt improved after first rounds of meds.  Will attempt a po challenge.  11:06 PM Pt able to po challenge and will d/c home.  Gwyneth Sprout, MD 03/06/13 [phone]

## 2013-03-06 NOTE — ED Notes (Signed)
Pt c/o abd pain hx colitis reports ? K low , reports  leg cramping

## 2013-03-12 ENCOUNTER — Emergency Department (HOSPITAL_COMMUNITY)
Admission: EM | Admit: 2013-03-12 | Discharge: 2013-03-12 | Disposition: A | Discharge status: Home / Self Care | Payer: BC Managed Care – PPO | Attending: Emergency Medicine | Admitting: Emergency Medicine

## 2013-03-12 ENCOUNTER — Encounter (HOSPITAL_COMMUNITY): Payer: Self-pay | Admitting: Emergency Medicine

## 2013-03-12 DIAGNOSIS — Z8679 Personal history of other diseases of the circulatory system: Secondary | ICD-10-CM | POA: Insufficient documentation

## 2013-03-12 DIAGNOSIS — G8929 Other chronic pain: Secondary | ICD-10-CM | POA: Insufficient documentation

## 2013-03-12 DIAGNOSIS — R1110 Vomiting, unspecified: Secondary | ICD-10-CM | POA: Insufficient documentation

## 2013-03-12 DIAGNOSIS — R1084 Generalized abdominal pain: Secondary | ICD-10-CM | POA: Insufficient documentation

## 2013-03-12 DIAGNOSIS — R197 Diarrhea, unspecified: Secondary | ICD-10-CM | POA: Insufficient documentation

## 2013-03-12 DIAGNOSIS — Z8719 Personal history of other diseases of the digestive system: Secondary | ICD-10-CM | POA: Insufficient documentation

## 2013-03-12 DIAGNOSIS — Z8619 Personal history of other infectious and parasitic diseases: Secondary | ICD-10-CM | POA: Insufficient documentation

## 2013-03-12 DIAGNOSIS — F17200 Nicotine dependence, unspecified, uncomplicated: Secondary | ICD-10-CM | POA: Insufficient documentation

## 2013-03-12 DIAGNOSIS — Z79899 Other long term (current) drug therapy: Secondary | ICD-10-CM | POA: Insufficient documentation

## 2013-03-12 DIAGNOSIS — Z862 Personal history of diseases of the blood and blood-forming organs and certain disorders involving the immune mechanism: Secondary | ICD-10-CM | POA: Insufficient documentation

## 2013-03-12 DIAGNOSIS — Z8639 Personal history of other endocrine, nutritional and metabolic disease: Secondary | ICD-10-CM | POA: Insufficient documentation

## 2013-03-12 DIAGNOSIS — Z87442 Personal history of urinary calculi: Secondary | ICD-10-CM | POA: Insufficient documentation

## 2013-03-12 DIAGNOSIS — E876 Hypokalemia: Secondary | ICD-10-CM | POA: Insufficient documentation

## 2013-03-12 LAB — COMPREHENSIVE METABOLIC PANEL
AST: 34 U/L (ref 0–37)
BUN: 12 mg/dL (ref 6–23)
CO2: 21 mEq/L (ref 19–32)
Chloride: 108 mEq/L (ref 96–112)
Creatinine, Ser: 0.61 mg/dL (ref 0.50–1.10)
GFR calc Af Amer: >90 mL/min (ref >90)
GFR calc non Af Amer: >90 mL/min (ref >90)
Glucose, Bld: 84 mg/dL (ref 70–99)
Potassium: 3.1 mEq/L — ABNORMAL LOW (ref 3.5–5.1)
Total Bilirubin: <0.1 mg/dL — ABNORMAL LOW (ref 0.3–1.2)
Total Protein: 5.6 g/dL — ABNORMAL LOW (ref 6.0–8.3)

## 2013-03-12 LAB — LIPASE, BLOOD: Lipase: 23 U/L (ref 11–59)

## 2013-03-12 LAB — CBC WITH DIFFERENTIAL/PLATELET
Basophils Absolute: 0 10*3/uL (ref 0.0–0.1)
Basophils Relative: 1 % (ref 0–1)
Eosinophils Relative: 2 % (ref 0–5)
HCT: 34.4 % — ABNORMAL LOW (ref 36.0–46.0)
Lymphocytes Relative: 28 % (ref 12–46)
MCHC: 34 g/dL (ref 30.0–36.0)
MCV: 102.7 fL — ABNORMAL HIGH (ref 78.0–100.0)
Monocytes Absolute: 0.3 10*3/uL (ref 0.1–1.0)
Neutro Abs: 3 10*3/uL (ref 1.7–7.7)
Neutrophils Relative %: 63 % (ref 43–77)
Platelets: 198 10*3/uL (ref 150–400)
RBC: 3.35 MIL/uL — ABNORMAL LOW (ref 3.87–5.11)
RDW: 13.1 % (ref 11.5–15.5)

## 2013-03-12 LAB — HCG, SERUM, QUALITATIVE: Preg, Serum: NEGATIVE

## 2013-03-12 MED ORDER — OXYCODONE-ACETAMINOPHEN 5-325 MG PO TABS: 1.0000 | ORAL_TABLET | Freq: Three times a day (TID) | ORAL | Status: DC | PRN

## 2013-03-12 MED ORDER — PROMETHAZINE HCL 25 MG PO TABS: 25.0000 mg | ORAL_TABLET | Freq: Four times a day (QID) | ORAL | Status: DC | PRN

## 2013-03-12 MED ORDER — HYDROCORTISONE SOD SUCCINATE 100 MG IJ SOLR
100.0000 mg | Freq: Once | INTRAMUSCULAR | Status: AC
  Administered 2013-03-12: 100 mg via INTRAVENOUS
  Filled 2013-03-12: qty 2

## 2013-03-12 MED ORDER — SODIUM CHLORIDE 0.9 % IV BOLUS (SEPSIS)
2000.0000 mL | Freq: Once | INTRAVENOUS | Status: AC
  Administered 2013-03-12: 2000 mL via INTRAVENOUS

## 2013-03-12 MED ORDER — SODIUM CHLORIDE 0.9 % IV SOLN
1.0000 g | Freq: Once | INTRAVENOUS | Status: AC
  Administered 2013-03-12: 1 g via INTRAVENOUS
  Filled 2013-03-12: qty 10

## 2013-03-12 MED ORDER — SODIUM CHLORIDE 0.9 % IV BOLUS (SEPSIS)
1000.0000 mL | Freq: Once | INTRAVENOUS | Status: AC
  Administered 2013-03-12: 1000 mL via INTRAVENOUS

## 2013-03-12 MED ORDER — PROMETHAZINE HCL 25 MG/ML IJ SOLN
25.0000 mg | Freq: Once | INTRAMUSCULAR | Status: AC
  Administered 2013-03-12: 25 mg via INTRAVENOUS
  Filled 2013-03-12: qty 1

## 2013-03-12 MED ORDER — POTASSIUM CHLORIDE CRYS ER 20 MEQ PO TBCR
40.0000 meq | EXTENDED_RELEASE_TABLET | Freq: Once | ORAL | Status: AC
  Administered 2013-03-12: 40 meq via ORAL
  Filled 2013-03-12: qty 2

## 2013-03-12 MED ORDER — POTASSIUM CHLORIDE CRYS ER 20 MEQ PO TBCR: 40.0000 meq | EXTENDED_RELEASE_TABLET | Freq: Every day | ORAL | Status: DC

## 2013-03-12 MED ORDER — LORAZEPAM 2 MG PO TABS: 2.0000 mg | ORAL_TABLET | Freq: Three times a day (TID) | ORAL | Status: DC | PRN

## 2013-03-12 MED ORDER — HYDROMORPHONE HCL PF 1 MG/ML IJ SOLN
1.0000 mg | Freq: Once | INTRAMUSCULAR | Status: AC
  Administered 2013-03-12: 1 mg via INTRAVENOUS
  Filled 2013-03-12: qty 1

## 2013-03-12 MED ORDER — LORAZEPAM 2 MG/ML IJ SOLN
1.0000 mg | Freq: Once | INTRAMUSCULAR | Status: AC
  Administered 2013-03-12: 1 mg via INTRAVENOUS
  Filled 2013-03-12: qty 1

## 2013-03-12 NOTE — ED Provider Notes (Signed)
CSN: [id number]     Arrival date & time 03/12/13  0935 History   First MD Initiated Contact with Patient 03/12/13 [phone]     Chief Complaint  Patient presents with  . Emesis  . Diarrhea   (Consider location/radiation/quality/duration/timing/severity/associated sxs/prior Treatment) HPI This 47 year old female has chronic intermittent abdominal pain with vomiting and diarrhea, she has had approximately 6 CT scans of her abdomen within the last 2 years and approximately 20 CT scans of her abdomen over the last 4 years and has had colitis intermittently on her CT scans according to the patient and prior records, she is in the process of moving from [place] to [place] for her husband has a new job, she was doing well for a couple of months however over the last week and half she has developed recurrent diffuse abdominal pain worse in the right side which is typical for her, she has had multiple episodes 10-20 times a day of both vomiting and diarrhea nonbloody, she is no dysuria no vaginal bleeding no vaginal discharge no fever no chest pain no shortness of breath, she feels dehydrated again today and lightheaded and feels as if she needs IV fluids again, she has not been able to take oral medications today, she has had hypokalemia multiple times in the past. Past Medical History  Diagnosis Date  . Collagenous colitis   . WPW (Wolff-Parkinson-White syndrome)   . Kidney stone   . Addison disease   . Thyroid disease   . Clostridium difficile diarrhea   . Anxiety and depression   . Chronic diarrhea   . Chronic back pain   . Drug-seeking behavior   . Irritable bowel syndrome (IBS)   . Anemia   . Kidney stone   . Pancreatitis    Past Surgical History  Procedure Laterality Date  . Cholecystectomy    . Tubal ligation    . Lithotripsy    . Sphincterotomy    . Cardiac electrophysiology mapping and ablation      for WPW  . Video bronchoscopy  08/31/2011    Procedure: VIDEO BRONCHOSCOPY  WITH FLUORO;  Surgeon: Storm Frisk, MD;  Location: [facility];  Service: Cardiopulmonary;  Laterality: N/A;   Family History  Problem Relation Age of Onset  . Prostate cancer Father   . Coronary artery disease Maternal Grandfather    History  Substance Use Topics  . Smoking status: Current Every Day Smoker -- 0.50 packs/day for 30 years    Types: Cigarettes  . Smokeless tobacco: Never Used  . Alcohol Use: Yes     Comment: seldom   OB History   Grav Para Term Preterm Abortions TAB SAB Ect Mult Living   3 3             Review of Systems 10 Systems reviewed and are negative for acute change except as noted in the HPI. Allergies  Prednisone; Ambien; Nsaids; and Zofran  Home Medications   Current Outpatient Rx  Name  Route  Sig  Dispense  Refill  . diphenoxylate-atropine (LOMOTIL) 2.5-0.025 MG per tablet   Oral   Take 1 tablet by mouth 4 (four) times daily as needed for diarrhea or loose stools.   15 tablet   0   . Multiple Vitamin (MULITIVITAMIN WITH MINERALS) TABS   Oral   Take 1 tablet by mouth daily.          . Vitamin D, Ergocalciferol, (DRISDOL) 50000 UNITS CAPS   Oral   Take  50,000 Units by mouth every Monday. Take on monday         . butalbital-acetaminophen-caffeine (FIORICET WITH CODEINE) 50-325-40-30 MG per capsule   Oral   Take 1 capsule by mouth every 4 (four) hours as needed for migraine.   30 capsule   0   . potassium chloride SA (K-DUR,KLOR-CON) 20 MEQ tablet   Oral   Take 2 tablets (40 mEq total) by mouth daily.   20 tablet   0   . promethazine (PHENERGAN) 25 MG tablet   Oral   Take 1 tablet (25 mg total) by mouth every 6 (six) hours as needed for nausea.   15 tablet   0    BP 109/62  Pulse 60  Temp(Src) 97.7 F (36.5 C)  Resp 16  Wt 112 lb (50.803 kg)  SpO2 99%  LMP 09/16/2012 Physical Exam  Nursing note and vitals reviewed. Constitutional:  Awake, alert, nontoxic appearance.  HENT:  Head: Atraumatic.  Eyes: Right eye  exhibits no discharge. Left eye exhibits no discharge.  Neck: Neck supple.  Cardiovascular: Normal rate and regular rhythm.   No murmur heard. Pulmonary/Chest: Effort normal and breath sounds normal. No respiratory distress. She has no wheezes. She has no rales. She exhibits no tenderness.  Abdominal: Soft. Bowel sounds are normal. She exhibits no distension and no mass. There is tenderness. There is no rebound and no guarding.  Mild diffuse abdominal tenderness, mild to moderate tenderness to the right side of the abdomen, no rebound  Musculoskeletal: She exhibits no edema and no tenderness.  Baseline ROM, no obvious new focal weakness.  Neurological: She is alert.  Mental status and motor strength appears baseline for patient and situation.  Skin: No rash noted.  Psychiatric: She has a normal mood and affect.    ED Course  Procedures (including critical care time) Pt feels improved after observation and/or treatment in ED.Patient informed of clinical course, understand medical decision-making process, and agree with plan. Labs Review Labs Reviewed  COMPREHENSIVE METABOLIC PANEL - Abnormal; Notable for the following:    Potassium 3.1 (*)    Calcium 7.6 (*)    Total Protein 5.6 (*)    Albumin 2.9 (*)    ALT 48 (*)    Alkaline Phosphatase 125 (*)    Total Bilirubin <0.1 (*)    All other components within normal limits  CBC WITH DIFFERENTIAL - Abnormal; Notable for the following:    RBC 3.35 (*)    Hemoglobin 11.7 (*)    HCT 34.4 (*)    MCV 102.7 (*)    MCH 34.9 (*)    All other components within normal limits  LIPASE, BLOOD  HCG, SERUM, QUALITATIVE  CBC WITH DIFFERENTIAL   Imaging Review No results found.  EKG Interpretation   None       MDM   1. Chronic abdominal pain   2. Vomiting and diarrhea   3. Hypokalemia   4. Hypocalcemia    I doubt any other EMC precluding discharge at this time including, but not necessarily limited to the following:peritonitis,  SBI.    Hurman Horn, MD 03/14/13 [phone]

## 2013-03-12 NOTE — ED Notes (Signed)
Vomiting/diarrhea x 10 days; previous treatment of same with some relief; now symptoms returned and worse; leg cramping; pain upper right abd

## 2013-03-13 ENCOUNTER — Ambulatory Visit (INDEPENDENT_AMBULATORY_CARE_PROVIDER_SITE_OTHER): Payer: BC Managed Care – PPO | Admitting: Family Medicine

## 2013-03-13 VITALS — BP 120/76 | HR 76 | Temp 98.1°F | Resp 16 | Ht 63.5 in | Wt 119.0 lb

## 2013-03-13 DIAGNOSIS — G43909 Migraine, unspecified, not intractable, without status migrainosus: Secondary | ICD-10-CM

## 2013-03-13 MED ORDER — BUTALBITAL-APAP-CAFF-COD 50-325-40-30 MG PO CAPS: 1.0000 | ORAL_CAPSULE | ORAL | Status: DC | PRN

## 2013-03-13 NOTE — Patient Instructions (Signed)
I have refilled the Fioricet to use only up to every 4 hours during migraine headache, can take phenergan as previously prescribed if needed. If any change in nature of your headaches, or frequency - will need to follow up here, emergency room or may need to see Neurology. Further refills of this medicine can be discussed with your new primary provider in [place]. Return to the clinic or go to the nearest emergency room if any of your symptoms worsen or new symptoms occur.

## 2013-03-13 NOTE — Progress Notes (Addendum)
Subjective:    Patient ID: Dawn Frost, female    DOB: 05/11/1965, 47 y.o.   MRN: [id number] This chart was scribed for Meredith Staggers, MD by Nicholos Johns, Medical Scribe. This patient's care was started at 4:40 PM.  HPI HPI COMMENTS:  Dawn Frost is 47 y.o. female is a new patient.  Complicated past medical history by problem list. Multiple MD notes noted in the system for recurrent abdominal pain. Here for refill of migraine medication prior to going out of town. Per chart review, last Fioricet prescription of #180 on 12/25/12 from [facility].  Today presents to the office in need of a Fioricet refill for migrane. Visited [place] for abdominal pain but was told to come here for migraine medication. Pt is moving to [facility] January 16th and in need of medication for migraines to take until she gets to [place] to set up PCP. Diagnosed 6 years ago in [place] and states migraines stopped for 2 years but have since came back 4-6 months ago. Has not seen anyone or been evaluated for current headaches. Medication prescribed 4 months ago by Dr. Sharyne Peach at [facility] - prior PCP, but she has left and no new PCP obtained.  Pt takes Fiorecet every 4 hours for 2-3 days at a time depending on length of migraine equaling to about 3-4 pills per day. She has migraines approx. 2x/month. Pt states she has been out of medication for 30-40 days, was given 60 and ran out within 2-3 months.  She reports left sided HA with blurred vision. Sometimes has nausea and vomiting, in which she takes Phenergan to suppress. These are typical of her prior migraine sx's.  Has tried multiple migraine medications in [place], all of which provide no relief except for Fiorecet. Denies previous addiction or trouble getting off of medication.  Patient Active Problem List   Diagnosis Date Noted  . Anxiety disorder 03/22/2012  . Panic disorder 03/22/2012  . Hypokalemia 01/30/2012  . Leukocytosis  01/30/2012  . Dehydration 01/30/2012  . Abdominal pain 01/06/2012  . Tobacco abuse 01/06/2012  . Hypersensitivity pneumonitis 08/31/2011  . Chronic diarrhea 08/28/2011  . Collagenous colitis 08/28/2011  . Chest pain 08/28/2011  . Diarrhea 06/09/2011  . Esophagitis 02/11/2011  . Lymphocytic colitis 02/06/2011  . Adrenal insufficiency 02/06/2011  . Hypothyroid 02/06/2011  . DEPRESSION 06/20/2010  . COSTOCHONDRITIS, ACUTE 03/14/2010  . 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 ACUTE GASTRITIS WITHOUT MENTION OF HEMORRHAGE 01/14/2009  . WEIGHT LOSS, RECENT 01/14/2009  . ANEMIA, MACROCYTIC, HX OF 01/14/2009   Past Medical History  Diagnosis Date  . Collagenous colitis   . WPW (Wolff-Parkinson-White syndrome)   . Kidney stone   . Addison disease   . Thyroid disease   . Clostridium difficile diarrhea   . Anxiety and depression   . Chronic diarrhea   . Chronic back pain   . Drug-seeking behavior   . Irritable bowel syndrome (IBS)   . Anemia   . Kidney stone   . Pancreatitis    Past Surgical History  Procedure Laterality Date  . Cholecystectomy    . Tubal ligation    . Lithotripsy    . Sphincterotomy    . Cardiac electrophysiology mapping and ablation      for WPW  . Video bronchoscopy  08/31/2011    Procedure: VIDEO BRONCHOSCOPY WITH FLUORO;  Surgeon: Storm Frisk, MD;  Location: [facility];  Service: Cardiopulmonary;  Laterality: N/A;   Allergies  Allergen Reactions  . Prednisone Other (See Comments)    Hyperactivity and agitation  . Ambien [Zolpidem Tartrate] Other (See Comments)    headache  . Nsaids Other (See Comments)    Bad for colitis  . Zofran Other (See Comments)    Headache    Prior to Admission medications   Medication Sig Start Date End Date Taking? Authorizing Provider  butalbital-acetaminophen-caffeine (FIORICET WITH CODEINE) 50-325-40-30 MG per capsule Take 1 capsule by mouth every 4 (four) hours as needed for headache.   Yes  Historical Provider, MD  diphenoxylate-atropine (LOMOTIL) 2.5-0.025 MG per tablet Take 1 tablet by mouth 4 (four) times daily as needed for diarrhea or loose stools. 03/06/13  Yes Gwyneth Sprout, MD  Multiple Vitamin (MULITIVITAMIN WITH MINERALS) TABS Take 1 tablet by mouth daily.    Yes Historical Provider, MD  potassium chloride SA (K-DUR,KLOR-CON) 20 MEQ tablet Take 2 tablets (40 mEq total) by mouth daily. 03/12/13  Yes Hurman Horn, MD  promethazine (PHENERGAN) 25 MG tablet Take 1 tablet (25 mg total) by mouth every 6 (six) hours as needed for nausea. 03/12/13  Yes Hurman Horn, MD  Vitamin D, Ergocalciferol, (DRISDOL) 50000 UNITS CAPS Take 50,000 Units by mouth every Monday. Take on monday   Yes Historical Provider, MD   History   Social History  . Marital Status: Married    Spouse Name: N/A    Number of Children: 3  . Years of Education: N/A   Occupational History  . Inventory in a warehouse    Social History Main Topics  . Smoking status: Current Every Day Smoker -- 0.50 packs/day for 30 years    Types: Cigarettes  . Smokeless tobacco: Never Used  . Alcohol Use: Yes     Comment: seldom  . Drug Use: No  . Sexual Activity: Not on file   Other Topics Concern  . Not on file   Social History Narrative  . No narrative on file    Review of Systems  Gastrointestinal: Negative for nausea and vomiting.  Neurological: Positive for headaches.       Objective:   Physical Exam  Vitals reviewed. Constitutional: She is oriented to person, place, and time. She appears well-developed and well-nourished. No distress.  HENT:  Head: Normocephalic and atraumatic.  Eyes: EOM are normal. Pupils are equal, round, and reactive to light. Right eye exhibits no nystagmus. Left eye exhibits no nystagmus.  Neck: Neck supple.  Cardiovascular: Normal rate, regular rhythm and normal heart sounds.  Exam reveals no gallop and no friction rub.   No murmur heard. Pulmonary/Chest: Effort normal  and breath sounds normal. No respiratory distress.  Musculoskeletal: Normal range of motion.  Neurological: She is alert and oriented to person, place, and time.  Negative Romberg. No pronator drift. Non focal exam.  Skin: Skin is warm and dry.  Psychiatric: She has a normal mood and affect. Her behavior is normal.    Filed Vitals:   03/13/13 1554  BP: 120/76  Pulse: 76  Temp: 98.1 F (36.7 C)  TempSrc: Oral  Resp: 16  Height: 5' 3.5" (1.613 m)  Weight: 119 lb (53.978 kg)  SpO2: 100%   Controlled substance database reviewed - #100 hydrocodone from Dr. Lendon Colonel at [facility]. Called pt after ov to discuss this - she stated she had this filled, but only took # 10 and disposed of other 90 in kitty litter, as was not followed by Dr. Sharyne Peach anymore, and did not want to  take these without being under her care.      Assessment & Plan:   Dawn Frost is a 47 y.o. female  Migraine headache - Plan: butalbital-acetaminophen-caffeine (FIORICET WITH CODEINE) 50-325-40-30 MG per capsule  Hx of migraine diagnosed in [place], with reported sx free interval, then recurrence past 6 months. Possible  correlation with stress with moving, but intermittent only and typical of prior migraines with approx 2 per month lasting 2-3 days each. typical of prior migraines. Nonfocal neuro exam and no change in migraines other than recurrence 6 months ago.   CSRS reviewed and med list noted - including other hospital systems, with chronic abd pain, and recent hydrocodone rx in November. No recent Fioricet Rx noted and she does not take hydrocodone during time of migraines. Denied addiction to these medicines and discussed ER eval in March - she checked into ER for her own concern of narcotic pain meds then, and reports being cautious with use of narcotic medicines due to concerns of her own.   Agreed to #30 of Fioricet after review of record and discussion with patient, but concerns of this medicine, multiple  ER evals, and hydrocodone Rx in November discussed, and understanding  expressed. She plans on follow up with PCP in [place] in approximately 1 month, but if change in sx's or need for med refill sooner - will need OV, ER eval or neuro eval.  Understanding expressed.   I personally performed the services described in this documentation, which was scribed in my presence. The recorded information has been reviewed and considered, and addended by me as needed.    Meds ordered this encounter  . butalbital-acetaminophen-caffeine (FIORICET WITH CODEINE) 50-325-40-30 MG per capsule    Sig: Take 1 capsule by mouth every 4 (four) hours as needed for migraine.    Dispense:  30 capsule    Refill:  0   Patient Instructions  I have refilled the Fioricet to use only up to every 4 hours during migraine headache, can take phenergan as previously prescribed if needed. If any change in nature of your headaches, or frequency - will need to follow up here, emergency room or may need to see Neurology. Further refills of this medicine can be discussed with your new primary provider in [place]. Return to the clinic or go to the nearest emergency room if any of your symptoms worsen or new symptoms occur.

## 2013-05-09 ENCOUNTER — Emergency Department (HOSPITAL_BASED_OUTPATIENT_CLINIC_OR_DEPARTMENT_OTHER)
Admission: EM | Admit: 2013-05-09 | Discharge: 2013-05-09 | Disposition: A | Discharge status: Home / Self Care | Payer: BC Managed Care – PPO | Attending: Emergency Medicine | Admitting: Emergency Medicine

## 2013-05-09 ENCOUNTER — Encounter (HOSPITAL_BASED_OUTPATIENT_CLINIC_OR_DEPARTMENT_OTHER): Payer: Self-pay | Admitting: Emergency Medicine

## 2013-05-09 DIAGNOSIS — Z765 Malingerer [conscious simulation]: Secondary | ICD-10-CM | POA: Insufficient documentation

## 2013-05-09 DIAGNOSIS — F17200 Nicotine dependence, unspecified, uncomplicated: Secondary | ICD-10-CM | POA: Insufficient documentation

## 2013-05-09 DIAGNOSIS — Z9851 Tubal ligation status: Secondary | ICD-10-CM | POA: Insufficient documentation

## 2013-05-09 DIAGNOSIS — Z8659 Personal history of other mental and behavioral disorders: Secondary | ICD-10-CM | POA: Insufficient documentation

## 2013-05-09 DIAGNOSIS — Z8639 Personal history of other endocrine, nutritional and metabolic disease: Secondary | ICD-10-CM | POA: Insufficient documentation

## 2013-05-09 DIAGNOSIS — Z862 Personal history of diseases of the blood and blood-forming organs and certain disorders involving the immune mechanism: Secondary | ICD-10-CM | POA: Insufficient documentation

## 2013-05-09 DIAGNOSIS — Z9089 Acquired absence of other organs: Secondary | ICD-10-CM | POA: Insufficient documentation

## 2013-05-09 DIAGNOSIS — R109 Unspecified abdominal pain: Secondary | ICD-10-CM

## 2013-05-09 DIAGNOSIS — I456 Pre-excitation syndrome: Secondary | ICD-10-CM | POA: Insufficient documentation

## 2013-05-09 DIAGNOSIS — Z79899 Other long term (current) drug therapy: Secondary | ICD-10-CM | POA: Insufficient documentation

## 2013-05-09 DIAGNOSIS — Z8619 Personal history of other infectious and parasitic diseases: Secondary | ICD-10-CM | POA: Insufficient documentation

## 2013-05-09 DIAGNOSIS — Z87442 Personal history of urinary calculi: Secondary | ICD-10-CM | POA: Insufficient documentation

## 2013-05-09 DIAGNOSIS — R1084 Generalized abdominal pain: Secondary | ICD-10-CM | POA: Insufficient documentation

## 2013-05-09 DIAGNOSIS — G8929 Other chronic pain: Secondary | ICD-10-CM | POA: Insufficient documentation

## 2013-05-09 DIAGNOSIS — Z9889 Other specified postprocedural states: Secondary | ICD-10-CM | POA: Insufficient documentation

## 2013-05-09 DIAGNOSIS — Z8719 Personal history of other diseases of the digestive system: Secondary | ICD-10-CM | POA: Insufficient documentation

## 2013-05-09 LAB — URINALYSIS, ROUTINE W REFLEX MICROSCOPIC
Bilirubin Urine: NEGATIVE
Glucose, UA: NEGATIVE mg/dL
HGB URINE DIPSTICK: NEGATIVE
Ketones, ur: NEGATIVE mg/dL
LEUKOCYTES UA: NEGATIVE
Nitrite: NEGATIVE
PH: 6 (ref 5.0–8.0)
PROTEIN: NEGATIVE mg/dL
SPECIFIC GRAVITY, URINE: 1.031 — AB (ref 1.005–1.030)
Urobilinogen, UA: 0.2 mg/dL (ref 0.0–1.0)

## 2013-05-09 LAB — COMPREHENSIVE METABOLIC PANEL
ALT: 16 U/L (ref 0–35)
AST: 14 U/L (ref 0–37)
Albumin: 3.6 g/dL (ref 3.5–5.2)
Alkaline Phosphatase: 102 U/L (ref 39–117)
BUN: 17 mg/dL (ref 6–23)
CALCIUM: 8.6 mg/dL (ref 8.4–10.5)
CHLORIDE: 106 meq/L (ref 96–112)
CO2: 23 meq/L (ref 19–32)
CREATININE: 0.7 mg/dL (ref 0.50–1.10)
GLUCOSE: 79 mg/dL (ref 70–99)
Potassium: 3.7 mEq/L (ref 3.7–5.3)
Sodium: 142 mEq/L (ref 137–147)
Total Bilirubin: <0.2 mg/dL — ABNORMAL LOW (ref 0.3–1.2)
Total Protein: 6.4 g/dL (ref 6.0–8.3)

## 2013-05-09 LAB — CBC WITH DIFFERENTIAL/PLATELET
Basophils Absolute: 0 10*3/uL (ref 0.0–0.1)
Basophils Relative: 0 % (ref 0–1)
EOS PCT: 3 % (ref 0–5)
Eosinophils Absolute: 0.3 10*3/uL (ref 0.0–0.7)
HEMATOCRIT: 34.9 % — AB (ref 36.0–46.0)
HEMOGLOBIN: 11.7 g/dL — AB (ref 12.0–15.0)
LYMPHS ABS: 2.6 10*3/uL (ref 0.7–4.0)
Lymphocytes Relative: 33 % (ref 12–46)
MCH: 35.7 pg — ABNORMAL HIGH (ref 26.0–34.0)
MCHC: 33.5 g/dL (ref 30.0–36.0)
MCV: 106.4 fL — AB (ref 78.0–100.0)
MONO ABS: 0.5 10*3/uL (ref 0.1–1.0)
Monocytes Relative: 6 % (ref 3–12)
NEUTROS ABS: 4.7 10*3/uL (ref 1.7–7.7)
Neutrophils Relative %: 58 % (ref 43–77)
Platelets: 264 10*3/uL (ref 150–400)
RBC: 3.28 MIL/uL — AB (ref 3.87–5.11)
RDW: 14.2 % (ref 11.5–15.5)
WBC: 8.1 10*3/uL (ref 4.0–10.5)

## 2013-05-09 LAB — LIPASE, BLOOD: Lipase: 13 U/L (ref 11–59)

## 2013-05-09 MED ORDER — HYDROMORPHONE HCL PF 1 MG/ML IJ SOLN
1.0000 mg | Freq: Once | INTRAMUSCULAR | Status: AC
  Administered 2013-05-09: 1 mg via INTRAVENOUS
  Filled 2013-05-09: qty 1

## 2013-05-09 MED ORDER — PROMETHAZINE HCL 25 MG PO TABS: 25.0000 mg | ORAL_TABLET | Freq: Four times a day (QID) | ORAL | Status: DC | PRN

## 2013-05-09 MED ORDER — SODIUM CHLORIDE 0.9 % IV BOLUS (SEPSIS)
1000.0000 mL | Freq: Once | INTRAVENOUS | Status: AC
  Administered 2013-05-09: 1000 mL via INTRAVENOUS

## 2013-05-09 MED ORDER — METOCLOPRAMIDE HCL 5 MG/ML IJ SOLN
10.0000 mg | Freq: Once | INTRAMUSCULAR | Status: AC
  Administered 2013-05-09: 10 mg via INTRAVENOUS
  Filled 2013-05-09: qty 2

## 2013-05-09 NOTE — Discharge Instructions (Signed)
Abdominal Pain, Adult °Many things can cause abdominal pain. Usually, abdominal pain is not caused by a disease and will improve without treatment. It can often be observed and treated at home. Your health care provider will do a physical exam and possibly order blood tests and X-rays to help determine the seriousness of your pain. However, in many cases, more time must pass before a clear cause of the pain can be found. Before that point, your health care provider may not know if you need more testing or further treatment. °HOME CARE INSTRUCTIONS  °Monitor your abdominal pain for any changes. The following actions may help to alleviate any discomfort you are experiencing: °· Only take over-the-counter or prescription medicines as directed by your health care provider. °· Do not take laxatives unless directed to do so by your health care provider. °· Try a clear liquid diet (broth, tea, or water) as directed by your health care provider. Slowly move to a bland diet as tolerated. °SEEK MEDICAL CARE IF: °· You have unexplained abdominal pain. °· You have abdominal pain associated with nausea or diarrhea. °· You have pain when you urinate or have a bowel movement. °· You experience abdominal pain that wakes you in the night. °· You have abdominal pain that is worsened or improved by eating food. °· You have abdominal pain that is worsened with eating fatty foods. °SEEK IMMEDIATE MEDICAL CARE IF:  °· Your pain does not go away within 2 hours. °· You have a fever. °· You keep throwing up (vomiting). °· Your pain is felt only in portions of the abdomen, such as the right side or the left lower portion of the abdomen. °· You pass bloody or black tarry stools. °MAKE SURE YOU: °· Understand these instructions.   °· Will watch your condition.   °· Will get help right away if you are not doing well or get worse.   °Document Released: 12/29/2004 Document Revised: 01/09/2013 Document Reviewed: 11/28/2012 °Exit[employer]® Patient  Information ©2014 [employer]. ° °

## 2013-05-09 NOTE — ED Notes (Signed)
Pt c/o abd pain with nausea x 1 day hx of same Colitis

## 2013-05-09 NOTE — ED Provider Notes (Signed)
CSN: [id number]     Arrival date & time 05/09/13  1837 History  This chart was scribed for Ariq Khamis B. Karle Starch, MD by Roe Coombs, ED Scribe. The patient was seen in room MH10/MH10. Patient's care was started at 7:05 PM.    Chief Complaint  Patient presents with  . Abdominal Pain    The history is provided by the patient. No language interpreter was used.    HPI Comments: Dawn Frost is a 47 y.o. female with a history of colitis, chronic abdominal pain and hypokalemia who presents to the Emergency Department complaining of constant, moderate to severe, right-sided abdominal pain onset 4 days ago. There is associated fever, nausea and vomiting. She has not noted a fever today, but yesterday her temperature was as high as 103. She is in the process of moving to [place] and is still trying to sell her house in [place]. She has not established with a GI in [place]. She is currently in a pain management contract at [facility]. She has had 6 CT scans of her abdomen in the last 2 years and 20 abdominal CT scans in the last 4 years. She was recently seen here on 03/22/13 complaining of diffuse abdominal pain with vomiting and diarrhea. She was treated with IV fluids with potassium, pain medicine and anti-nausea medicine. She has a surgical history of cholecystectomy, lithotripsy and sphincterotomy.   Past Medical History  Diagnosis Date  . Collagenous colitis   . WPW (Wolff-Parkinson-White syndrome)   . Kidney stone   . Addison disease   . Thyroid disease   . Clostridium difficile diarrhea   . Anxiety and depression   . Chronic diarrhea   . Chronic back pain   . Drug-seeking behavior   . Irritable bowel syndrome (IBS)   . Anemia   . Kidney stone   . Pancreatitis    Past Surgical History  Procedure Laterality Date  . Cholecystectomy    . Tubal ligation    . Lithotripsy    . Sphincterotomy    . Cardiac electrophysiology mapping and ablation      for WPW  . Video  bronchoscopy  08/31/2011    Procedure: VIDEO BRONCHOSCOPY WITH FLUORO;  Surgeon: Elsie Stain, MD;  Location: [facility];  Service: Cardiopulmonary;  Laterality: N/A;   Family History  Problem Relation Age of Onset  . Prostate cancer Father   . Coronary artery disease Maternal Grandfather    History  Substance Use Topics  . Smoking status: Current Every Day Smoker -- 0.50 packs/day for 30 years    Types: Cigarettes  . Smokeless tobacco: Never Used  . Alcohol Use: Yes     Comment: seldom   OB History   Grav Para Term Preterm Abortions TAB SAB Ect Mult Living   3 3             Review of Systems A complete 10 system review of systems was obtained and all systems are negative except as noted in the HPI and PMH.   Allergies  Prednisone; Ambien; Nsaids; and Zofran  Home Medications   Current Outpatient Rx  Name  Route  Sig  Dispense  Refill  . butalbital-acetaminophen-caffeine (FIORICET WITH CODEINE) 50-325-40-30 MG per capsule   Oral   Take 1 capsule by mouth every 4 (four) hours as needed for migraine.   30 capsule   0   . diphenoxylate-atropine (LOMOTIL) 2.5-0.025 MG per tablet   Oral   Take 1 tablet by  mouth 4 (four) times daily as needed for diarrhea or loose stools.   15 tablet   0   . Multiple Vitamin (MULITIVITAMIN WITH MINERALS) TABS   Oral   Take 1 tablet by mouth daily.          . potassium chloride SA (K-DUR,KLOR-CON) 20 MEQ tablet   Oral   Take 2 tablets (40 mEq total) by mouth daily.   20 tablet   0   . promethazine (PHENERGAN) 25 MG tablet   Oral   Take 1 tablet (25 mg total) by mouth every 6 (six) hours as needed for nausea.   15 tablet   0   . Vitamin D, Ergocalciferol, (DRISDOL) 50000 UNITS CAPS   Oral   Take 50,000 Units by mouth every Monday. Take on monday          Triage Vitals: BP 100/52  Pulse 81  Temp(Src) 98.1 F (36.7 C) (Oral)  Resp 16  Ht 5\' 4"  (1.626 m)  Wt 115 lb (52.164 kg)  BMI 19.73 kg/m2  SpO2 100%  LMP  09/16/2012 Physical Exam  Nursing note and vitals reviewed. Constitutional: She is oriented to person, place, and time. She appears well-developed and well-nourished.  HENT:  Head: Normocephalic and atraumatic.  Eyes: EOM are normal. Pupils are equal, round, and reactive to light.  Neck: Normal range of motion. Neck supple.  Cardiovascular: Normal rate, normal heart sounds and intact distal pulses.   Pulmonary/Chest: Effort normal and breath sounds normal.  Abdominal: Bowel sounds are normal. She exhibits no distension. There is tenderness (diffuse abdomen, worse in RLQ). There is no rebound and no guarding.  Musculoskeletal: Normal range of motion. She exhibits no edema and no tenderness.  Neurological: She is alert and oriented to person, place, and time. She has normal strength. No cranial nerve deficit or sensory deficit.  Skin: Skin is warm and dry. No rash noted.  Psychiatric: She has a normal mood and affect.    ED Course  Procedures (including critical care time) DIAGNOSTIC STUDIES: Oxygen Saturation is 100% on room air, normal by my interpretation.    COORDINATION OF CARE: 7:14 PM- Patient informed of current plan for treatment and evaluation and agrees with plan at this time.     Labs Review Labs Reviewed  URINALYSIS, ROUTINE W REFLEX MICROSCOPIC - Abnormal; Notable for the following:    APPearance CLOUDY (*)    Specific Gravity, Urine 1.031 (*)    All other components within normal limits  CBC WITH DIFFERENTIAL - Abnormal; Notable for the following:    RBC 3.28 (*)    Hemoglobin 11.7 (*)    HCT 34.9 (*)    MCV 106.4 (*)    MCH 35.7 (*)    All other components within normal limits  COMPREHENSIVE METABOLIC PANEL - Abnormal; Notable for the following:    Total Bilirubin <0.2 (*)    All other components within normal limits  LIPASE, BLOOD   Imaging Review No results found.  EKG Interpretation   None       MDM   1. Abdominal pain     Labs unremarkable,  symptoms improved, exam improved. No peritoneal signs now. Pt has had numerous prior CT scans of abdomen and chest. Would like to avoid additional radiation exposure today. Discussed this plan with patient and she is in agreement. Will d/c with phenergan. She has pain contract.   I personally performed the services described in this documentation, which was scribed in my presence.  The recorded information has been reviewed and is accurate.      Xin Klawitter B. Karle Starch, MD 05/09/13 2040

## 2013-05-23 ENCOUNTER — Other Ambulatory Visit: Payer: Self-pay | Admitting: Pain Medicine

## 2013-05-23 DIAGNOSIS — M545 Low back pain, unspecified: Secondary | ICD-10-CM

## 2013-05-23 DIAGNOSIS — M542 Cervicalgia: Secondary | ICD-10-CM

## 2013-05-25 ENCOUNTER — Inpatient Hospital Stay: Admission: RE | Admit: 2013-05-25 | Payer: Self-pay | Source: Ambulatory Visit

## 2013-06-11 ENCOUNTER — Other Ambulatory Visit: Payer: Self-pay

## 2013-07-09 IMAGING — CT CT ABD-PELV W/ CM
2 of 5 series · 16 of 46 positions shown, 18 images · IV contrast (APPLIED)
Comparison: 06/07/2011

CLINICAL DATA: Abdominal pain.  History of colitis.  White count
9.80.  Nausea, vomiting, diarrhea.  Chronic back pain.

CT ABDOMEN AND PELVIS WITH CONTRAST
TECHNIQUE: Multidetector CT imaging of the abdomen and pelvis was
performed following the standard protocol during bolus
administration of intravenous contrast.
Contrast: 100mL OMNIPAQUE IOHEXOL 300 MG/ML  SOLN

[Series 2: abd/pelvis 5.0 b31f · axial · 0.66mm/px · z∈[+812,+1212]mm · 13 of 90 slices shown, 15 images]
[im 5/90  soft-tissue]
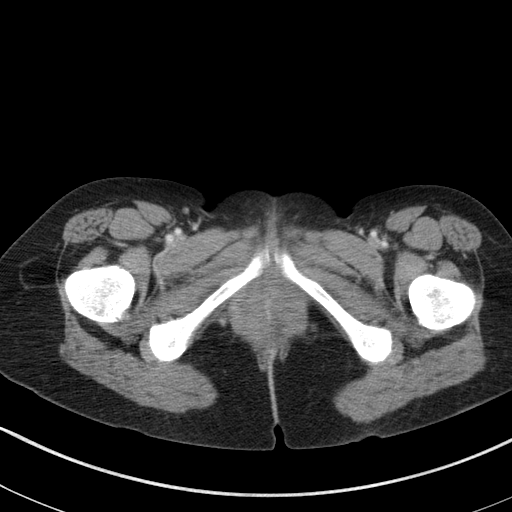
[im 5/90  bone]
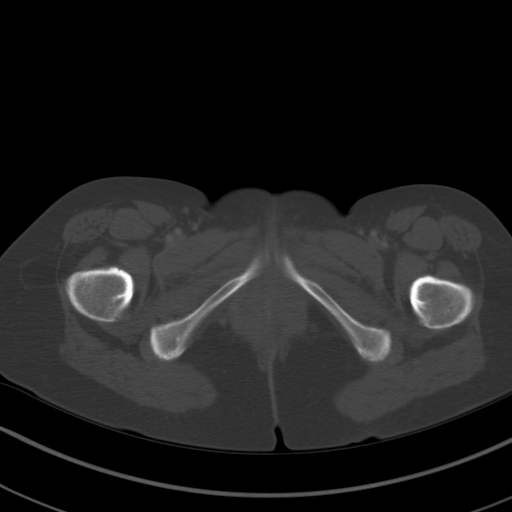
[im 13/90  soft-tissue]
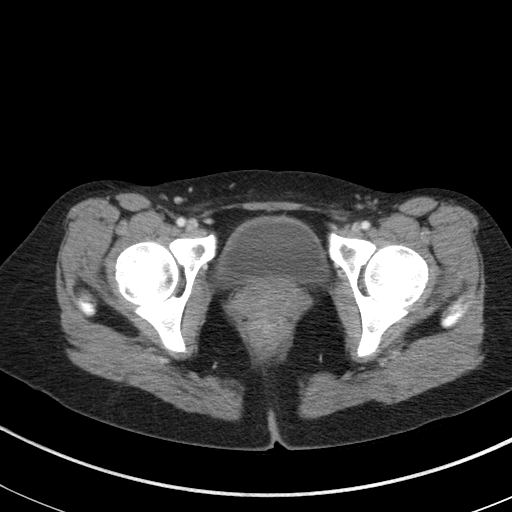
[im 17/90  soft-tissue]
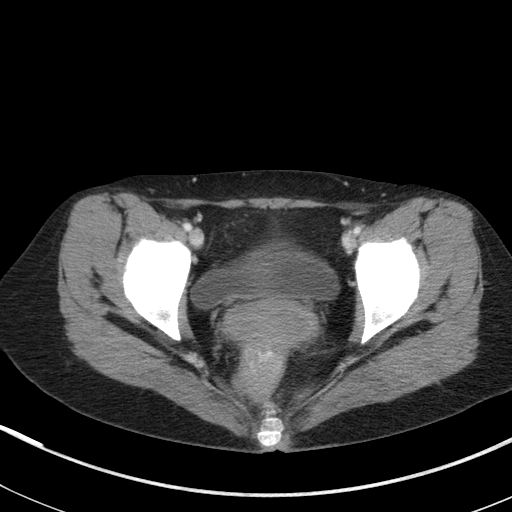
[im 26/90  soft-tissue]
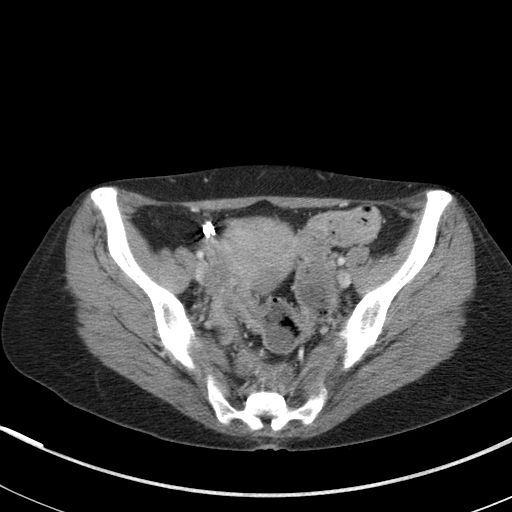
[im 30/90  soft-tissue]
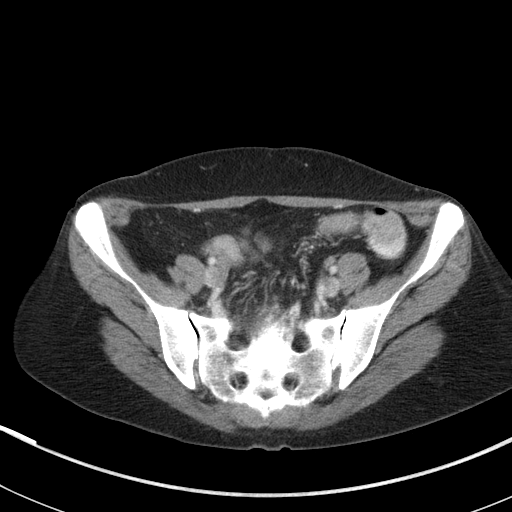
[im 39/90  soft-tissue]
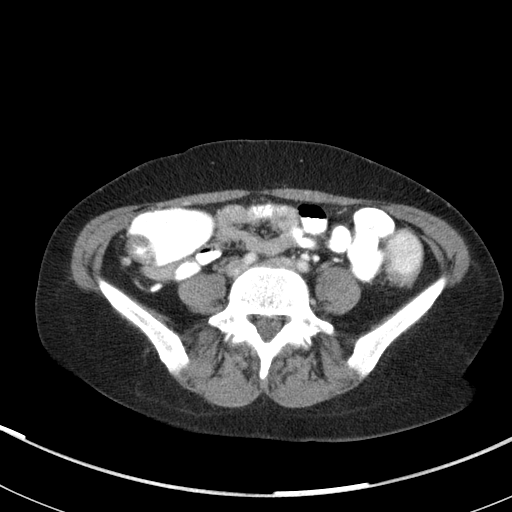
[im 47/90  soft-tissue]
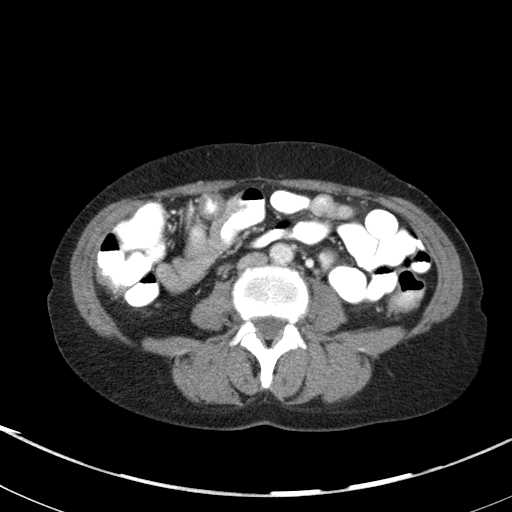
[im 51/90  soft-tissue]
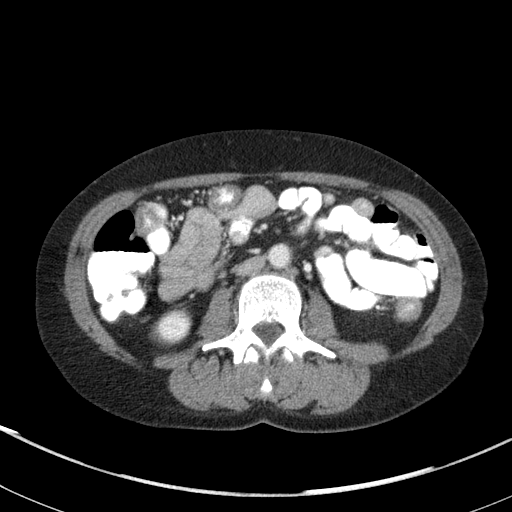
[im 60/90  soft-tissue]
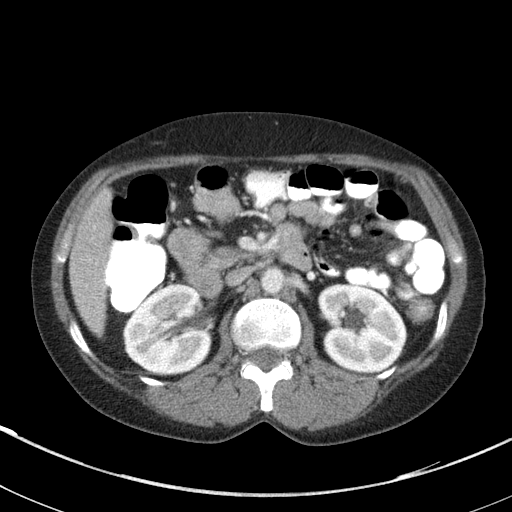
[im 60/90  bone]
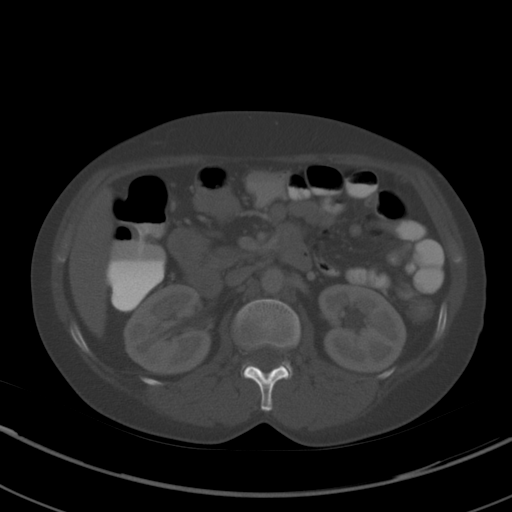
[im 64/90  soft-tissue]
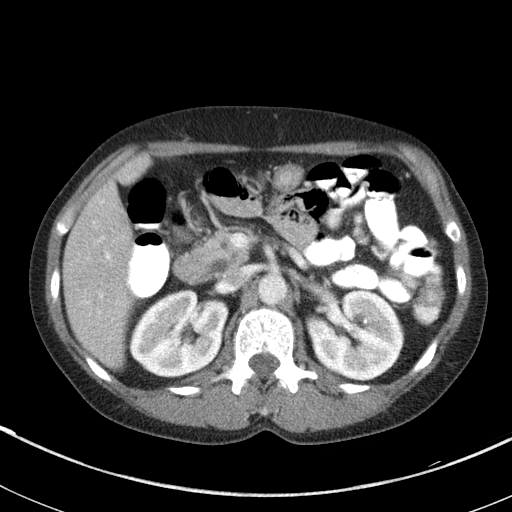
[im 73/90  soft-tissue]
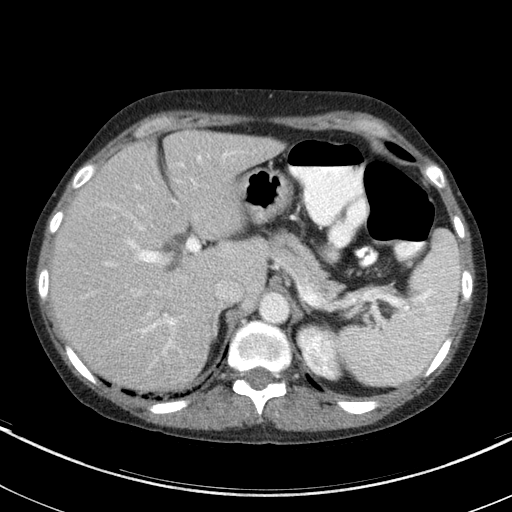
[im 77/90  soft-tissue]
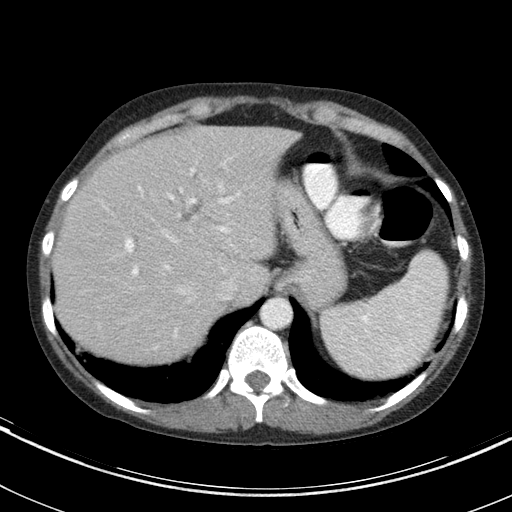
[im 85/90  soft-tissue]
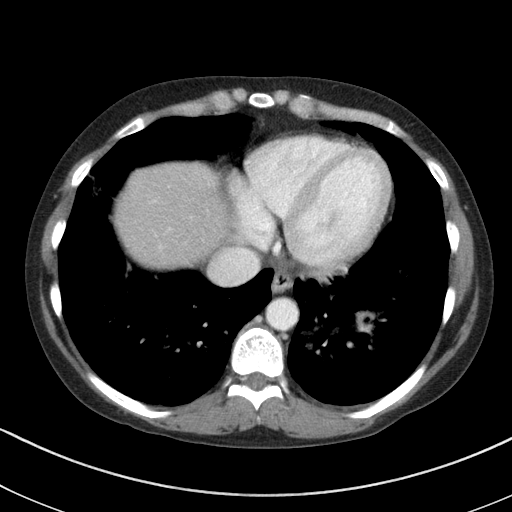

[Series 5: abd/pelvis 3.0 coronal · coronal · 0.81mm/px · 3 of 71 slices shown]
[im 24/71  soft-tissue]
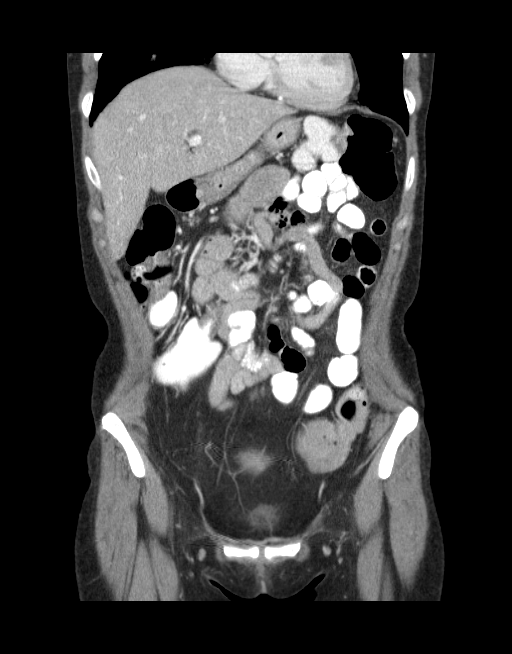
[im 32/71  soft-tissue]
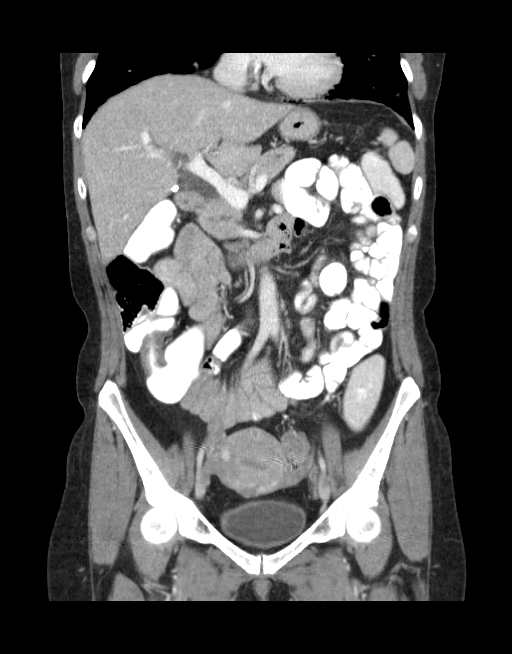
[im 39/71  soft-tissue]
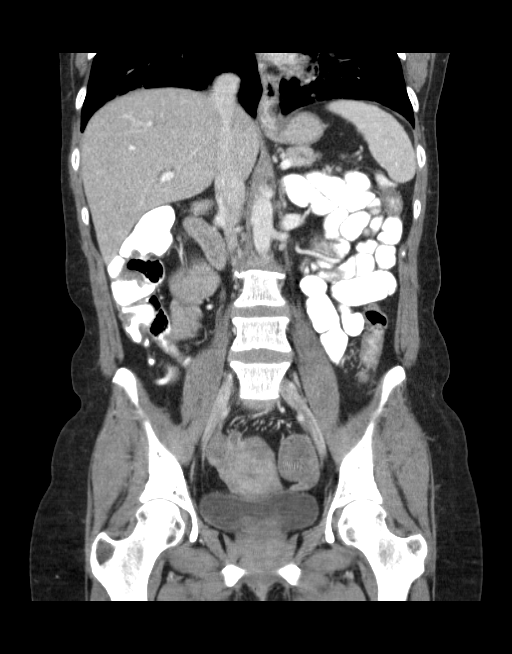

[16 of 46 positions shown; findings below may reference images not displayed]

FINDINGS: The lung bases are markedly abnormal.  Multiple pulmonary
nodules have developed since prior study.  These vary in size from
approximately 1 mm to 1.6 cm.  Nodules are circumscribed and
irregular.  No pleural effusions are identified.

No focal abnormality identified in the liver, spleen, pancreas,
adrenal glands, or kidneys. The appendix is well seen and has a
normal appearance.  Gallbladder is absent.

The stomach and small bowel loops have a normal appearance. Colonic
loops are normal in appearance. The uterus is present.  No evidence
for adnexal mass.  Patient has had bilateral tubal ligation.  No
free pelvic fluid or pelvic adenopathy.
IMPRESSION: 1.  Markedly abnormal lung bases with multiple large and small
pulmonary masses.  Differential diagnosis includes
inflammatory/infectious process, metastases, other new process,
septic emboli.  Recommend further evaluation with CT of the chest
with contrast to evaluate for adenopathy in further
characterization of these nodules.
2.  No evidence for acute appendicitis or colitis.
3.  Status post bilateral tubal ligation.

## 2013-07-15 ENCOUNTER — Other Ambulatory Visit: Payer: Self-pay | Admitting: Pain Medicine

## 2013-07-15 ENCOUNTER — Ambulatory Visit
Admission: RE | Admit: 2013-07-15 | Discharge: 2013-07-15 | Disposition: A | Discharge status: Home / Self Care | Payer: BC Managed Care – PPO | Source: Ambulatory Visit | Attending: Pain Medicine | Admitting: Pain Medicine

## 2013-07-15 DIAGNOSIS — M549 Dorsalgia, unspecified: Secondary | ICD-10-CM

## 2013-07-15 DIAGNOSIS — M25511 Pain in right shoulder: Secondary | ICD-10-CM

## 2013-07-15 DIAGNOSIS — M542 Cervicalgia: Secondary | ICD-10-CM

## 2013-07-15 DIAGNOSIS — M545 Low back pain, unspecified: Secondary | ICD-10-CM

## 2013-08-07 ENCOUNTER — Encounter (HOSPITAL_COMMUNITY): Payer: Self-pay | Admitting: Emergency Medicine

## 2013-08-07 ENCOUNTER — Emergency Department (HOSPITAL_COMMUNITY): Payer: BC Managed Care – PPO

## 2013-08-07 ENCOUNTER — Emergency Department (HOSPITAL_COMMUNITY)
Admission: EM | Admit: 2013-08-07 | Discharge: 2013-08-08 | Disposition: A | Discharge status: Home / Self Care | Payer: BC Managed Care – PPO | Attending: Emergency Medicine | Admitting: Emergency Medicine

## 2013-08-07 DIAGNOSIS — Z8679 Personal history of other diseases of the circulatory system: Secondary | ICD-10-CM | POA: Insufficient documentation

## 2013-08-07 DIAGNOSIS — R109 Unspecified abdominal pain: Secondary | ICD-10-CM

## 2013-08-07 DIAGNOSIS — R112 Nausea with vomiting, unspecified: Secondary | ICD-10-CM | POA: Insufficient documentation

## 2013-08-07 DIAGNOSIS — R197 Diarrhea, unspecified: Secondary | ICD-10-CM | POA: Insufficient documentation

## 2013-08-07 DIAGNOSIS — R1031 Right lower quadrant pain: Secondary | ICD-10-CM | POA: Insufficient documentation

## 2013-08-07 DIAGNOSIS — G8929 Other chronic pain: Secondary | ICD-10-CM | POA: Insufficient documentation

## 2013-08-07 DIAGNOSIS — Z79899 Other long term (current) drug therapy: Secondary | ICD-10-CM | POA: Insufficient documentation

## 2013-08-07 DIAGNOSIS — F411 Generalized anxiety disorder: Secondary | ICD-10-CM | POA: Insufficient documentation

## 2013-08-07 DIAGNOSIS — Z8719 Personal history of other diseases of the digestive system: Secondary | ICD-10-CM | POA: Insufficient documentation

## 2013-08-07 DIAGNOSIS — R1032 Left lower quadrant pain: Secondary | ICD-10-CM | POA: Insufficient documentation

## 2013-08-07 DIAGNOSIS — N950 Postmenopausal bleeding: Secondary | ICD-10-CM | POA: Insufficient documentation

## 2013-08-07 DIAGNOSIS — Z862 Personal history of diseases of the blood and blood-forming organs and certain disorders involving the immune mechanism: Secondary | ICD-10-CM | POA: Insufficient documentation

## 2013-08-07 DIAGNOSIS — Z87442 Personal history of urinary calculi: Secondary | ICD-10-CM | POA: Insufficient documentation

## 2013-08-07 DIAGNOSIS — Z8619 Personal history of other infectious and parasitic diseases: Secondary | ICD-10-CM | POA: Insufficient documentation

## 2013-08-07 DIAGNOSIS — Z8639 Personal history of other endocrine, nutritional and metabolic disease: Secondary | ICD-10-CM | POA: Insufficient documentation

## 2013-08-07 DIAGNOSIS — K529 Noninfective gastroenteritis and colitis, unspecified: Secondary | ICD-10-CM

## 2013-08-07 DIAGNOSIS — F17200 Nicotine dependence, unspecified, uncomplicated: Secondary | ICD-10-CM | POA: Insufficient documentation

## 2013-08-07 DIAGNOSIS — Z9851 Tubal ligation status: Secondary | ICD-10-CM | POA: Insufficient documentation

## 2013-08-07 DIAGNOSIS — Z9089 Acquired absence of other organs: Secondary | ICD-10-CM | POA: Insufficient documentation

## 2013-08-07 LAB — CBC WITH DIFFERENTIAL/PLATELET
BASOS ABS: 0 10*3/uL (ref 0.0–0.1)
BASOS PCT: 0 % (ref 0–1)
EOS ABS: 0.1 10*3/uL (ref 0.0–0.7)
EOS PCT: 1 % (ref 0–5)
HCT: 40.5 % (ref 36.0–46.0)
Hemoglobin: 13.7 g/dL (ref 12.0–15.0)
Lymphocytes Relative: 22 % (ref 12–46)
Lymphs Abs: 2.1 10*3/uL (ref 0.7–4.0)
MCH: 36.1 pg — ABNORMAL HIGH (ref 26.0–34.0)
MCHC: 33.8 g/dL (ref 30.0–36.0)
MCV: 106.6 fL — AB (ref 78.0–100.0)
Monocytes Absolute: 0.6 10*3/uL (ref 0.1–1.0)
Monocytes Relative: 7 % (ref 3–12)
Neutro Abs: 6.8 10*3/uL (ref 1.7–7.7)
Neutrophils Relative %: 71 % (ref 43–77)
PLATELETS: 214 10*3/uL (ref 150–400)
RBC: 3.8 MIL/uL — ABNORMAL LOW (ref 3.87–5.11)
RDW: 13.2 % (ref 11.5–15.5)
WBC: 9.6 10*3/uL (ref 4.0–10.5)

## 2013-08-07 LAB — URINE MICROSCOPIC-ADD ON

## 2013-08-07 LAB — COMPREHENSIVE METABOLIC PANEL
ALBUMIN: 4.2 g/dL (ref 3.5–5.2)
ALT: 12 U/L (ref 0–35)
AST: 15 U/L (ref 0–37)
Alkaline Phosphatase: 105 U/L (ref 39–117)
BUN: 16 mg/dL (ref 6–23)
CALCIUM: 9.3 mg/dL (ref 8.4–10.5)
CO2: 22 meq/L (ref 19–32)
Chloride: 101 mEq/L (ref 96–112)
Creatinine, Ser: 0.78 mg/dL (ref 0.50–1.10)
GFR calc Af Amer: >90 mL/min (ref >90)
GFR calc non Af Amer: >90 mL/min (ref >90)
Glucose, Bld: 91 mg/dL (ref 70–99)
Potassium: 3.5 mEq/L — ABNORMAL LOW (ref 3.7–5.3)
SODIUM: 140 meq/L (ref 137–147)
TOTAL PROTEIN: 7.2 g/dL (ref 6.0–8.3)
Total Bilirubin: 0.3 mg/dL (ref 0.3–1.2)

## 2013-08-07 LAB — WET PREP, GENITAL
Trich, Wet Prep: NONE SEEN
Yeast Wet Prep HPF POC: NONE SEEN

## 2013-08-07 LAB — URINALYSIS, ROUTINE W REFLEX MICROSCOPIC
Bilirubin Urine: NEGATIVE
GLUCOSE, UA: NEGATIVE mg/dL
KETONES UR: NEGATIVE mg/dL
Leukocytes, UA: NEGATIVE
NITRITE: NEGATIVE
PH: 5.5 (ref 5.0–8.0)
Protein, ur: NEGATIVE mg/dL
Specific Gravity, Urine: 1.029 (ref 1.005–1.030)
UROBILINOGEN UA: 0.2 mg/dL (ref 0.0–1.0)

## 2013-08-07 MED ORDER — PROMETHAZINE HCL 25 MG/ML IJ SOLN
12.5000 mg | Freq: Once | INTRAMUSCULAR | Status: AC
  Administered 2013-08-07: 21:00:00 via INTRAVENOUS
  Filled 2013-08-07: qty 1

## 2013-08-07 MED ORDER — IOHEXOL 300 MG/ML  SOLN
100.0000 mL | Freq: Once | INTRAMUSCULAR | Status: AC | PRN
  Administered 2013-08-07: 100 mL via INTRAVENOUS

## 2013-08-07 MED ORDER — MORPHINE SULFATE 4 MG/ML IJ SOLN
4.0000 mg | Freq: Once | INTRAMUSCULAR | Status: AC
  Administered 2013-08-07: 4 mg via INTRAVENOUS
  Filled 2013-08-07: qty 1

## 2013-08-07 MED ORDER — HYDROMORPHONE HCL PF 1 MG/ML IJ SOLN
1.0000 mg | Freq: Once | INTRAMUSCULAR | Status: AC
  Administered 2013-08-07: 1 mg via INTRAVENOUS
  Filled 2013-08-07: qty 1

## 2013-08-07 MED ORDER — LORAZEPAM 2 MG/ML IJ SOLN
1.0000 mg | Freq: Once | INTRAMUSCULAR | Status: AC
  Administered 2013-08-07: 21:00:00 via INTRAVENOUS
  Filled 2013-08-07: qty 1

## 2013-08-07 MED ORDER — SODIUM CHLORIDE 0.9 % IV BOLUS (SEPSIS)
1000.0000 mL | Freq: Once | INTRAVENOUS | Status: AC
  Administered 2013-08-07: 1000 mL via INTRAVENOUS

## 2013-08-07 MED ORDER — OXYCODONE-ACETAMINOPHEN 5-325 MG PO TABS: 1.0000 | ORAL_TABLET | ORAL | Status: DC | PRN

## 2013-08-07 MED ORDER — PROMETHAZINE HCL 25 MG PO TABS: 25.0000 mg | ORAL_TABLET | Freq: Four times a day (QID) | ORAL | Status: DC | PRN

## 2013-08-07 MED ORDER — PROMETHAZINE HCL 25 MG/ML IJ SOLN
25.0000 mg | Freq: Once | INTRAMUSCULAR | Status: AC
  Administered 2013-08-07: 25 mg via INTRAVENOUS
  Filled 2013-08-07: qty 1

## 2013-08-07 MED ORDER — POTASSIUM CHLORIDE CRYS ER 20 MEQ PO TBCR
40.0000 meq | EXTENDED_RELEASE_TABLET | Freq: Once | ORAL | Status: AC
  Administered 2013-08-07: 40 meq via ORAL
  Filled 2013-08-07: qty 2

## 2013-08-07 NOTE — ED Notes (Signed)
Pt ambulated to the BR with steady gait.  [place] at bedside.

## 2013-08-07 NOTE — ED Notes (Signed)
Pt reports back pain, nausea, and R lower abdominal pain d/t cholitis that pt noticed on Sunday. New onset pelvic cramping pain and vaginal bleeding on Sunday. Pt states she went through menopause 2 yrs ago, hasn't had bleeding since. Pt states went through 2 pads yesterday and 3 today. Denies dizziness/ lightheadedness.

## 2013-08-07 NOTE — ED Provider Notes (Signed)
Medical screening examination/treatment/procedure(s) were performed by non-physician practitioner and as supervising physician I was immediately available for consultation/collaboration.   EKG Interpretation None       Threasa Beards, MD 08/07/13 2329

## 2013-08-07 NOTE — Discharge Instructions (Signed)
1. Medications: percocet, ohenergan, usual home medications 2. Treatment: rest, drink plenty of fluids,  3. Follow Up: Please followup with the women's outpatient OB/GYN clinic tomorrow for further evaluation  Postmenopausal Bleeding Postmenopausal bleeding is any bleeding a woman has after she has entered into menopause. Menopause is the end of a woman's fertile years. After menopause, a woman no longer ovulates or has menstrual periods.  Postmenopausal bleeding can be caused by various things. Any type of postmenopausal bleeding, even if it appears to be a typical menstrual period, is concerning. This should be evaluated by your health care provider. Any treatment will depend on the cause of the bleeding. HOME CARE INSTRUCTIONS Monitor your condition for any changes. The following actions may help to alleviate any discomfort you are experiencing:  Avoid the use of tampons and douches as directed by your health care provider.  Change your pads frequently.  Get regular pelvic exams and Pap tests.  Keep all follow-up appointments for diagnostic tests as directed by your health care provider. SEEK MEDICAL CARE IF:   Your bleeding lasts more than 1 week.  You have abdominal pain.  You have bleeding with sexual intercourse. SEEK IMMEDIATE MEDICAL CARE IF:   You have a fever, chills, headache, dizziness, muscle aches, and bleeding.  You have severe pain with bleeding.  You are passing blood clots.  You have bleeding and need more than 1 pad an hour.  You feel faint. MAKE SURE YOU:  Understand these instructions.  Will watch your condition.  Will get help right away if you are not doing well or get worse. Document Released: 06/29/2005 Document Revised: 01/09/2013 Document Reviewed: 10/18/2012 [facility] Patient Information 2014 [place], [place].

## 2013-08-07 NOTE — ED Provider Notes (Signed)
CSN: [id number]     Arrival date & time 08/07/13  1656 History   First MD Initiated Contact with Patient 08/07/13 1707     Chief Complaint  Patient presents with  . Vaginal Bleeding     (Consider location/radiation/quality/duration/timing/severity/associated sxs/prior Treatment) The history is provided by the patient and medical records. No language interpreter was used.    Dawn Frost is a 48 y.o. female  with a hx of recurrent colitis, addison disease, hypothyroid, anemia, chronic diarrhea, pancreatitis presents to the Emergency Department complaining of gradual, persistent, progressively worsening vaginal bleeding onset 4 days ago.  Pt reports associated lower abd pain and cramping.  Pt reports long Hx of colitis followed by GI (Dr. Patrick North, [place]) and taking lomotil for the flares. Pt reports she would not have ordinarily come to the ED but when the vaginal bleeding began she came to the ED.  She reports finishing with menopause 2 years ago with no menstruation since then.  Pt reports her last evaluation by a GYN was 2 years ago.  Pt reports waxing and waning of the bleeding throughout the day while using 2-3 maxi pads or tampons per day.  Pt reports that the bleeding seems to be worse when she urinates.  Pt denies fevers, chills, headache, neck pain, chest pain, SOB, weakness, dizziness, syncope, dysuria. Pt reports nausea, vomiting and diarrhea all consistent with her chronic colitis.      Past Medical History  Diagnosis Date  . Collagenous colitis   . WPW (Wolff-Parkinson-White syndrome)   . Kidney stone   . Addison disease   . Thyroid disease   . Clostridium difficile diarrhea   . Anxiety and depression   . Chronic diarrhea   . Chronic back pain   . Drug-seeking behavior   . Irritable bowel syndrome (IBS)   . Anemia   . Kidney stone   . Pancreatitis    Past Surgical History  Procedure Laterality Date  . Cholecystectomy    . Tubal ligation    . Lithotripsy    .  Sphincterotomy    . Cardiac electrophysiology mapping and ablation      for WPW  . Video bronchoscopy  08/31/2011    Procedure: VIDEO BRONCHOSCOPY WITH FLUORO;  Surgeon: Elsie Stain, MD;  Location: [facility];  Service: Cardiopulmonary;  Laterality: N/A;   Family History  Problem Relation Age of Onset  . Prostate cancer Father   . Coronary artery disease Maternal Grandfather    History  Substance Use Topics  . Smoking status: Current Every Day Smoker -- 0.25 packs/day for 30 years    Types: Cigarettes  . Smokeless tobacco: Never Used  . Alcohol Use: Yes     Comment: seldom   OB History   Grav Para Term Preterm Abortions TAB SAB Ect Mult Living   3 3             Review of Systems  Constitutional: Negative for fever, diaphoresis, appetite change, fatigue and unexpected weight change.  HENT: Negative for mouth sores.   Eyes: Negative for visual disturbance.  Respiratory: Negative for cough, chest tightness, shortness of breath and wheezing.   Cardiovascular: Negative for chest pain.  Gastrointestinal: Positive for nausea (chronic), vomiting (chronic), abdominal pain (lower) and diarrhea (chronic). Negative for constipation.  Endocrine: Negative for polydipsia, polyphagia and polyuria.  Genitourinary: Positive for vaginal bleeding and pelvic pain. Negative for dysuria, urgency, frequency and hematuria.  Musculoskeletal: Negative for back pain and neck stiffness.  Skin: Negative  for rash.  Allergic/Immunologic: Negative for immunocompromised state.  Neurological: Negative for syncope, light-headedness and headaches.  Hematological: Does not bruise/bleed easily.  Psychiatric/Behavioral: Negative for sleep disturbance. The patient is not nervous/anxious.       Allergies  Prednisone; Ambien; Nsaids; and Zofran  Home Medications   Prior to Admission medications   Medication Sig Start Date End Date Taking? Authorizing Provider  butalbital-acetaminophen-caffeine (FIORICET  WITH CODEINE) 50-325-40-30 MG per capsule Take 1 capsule by mouth every 4 (four) hours as needed for migraine. 03/13/13  Yes Wendie Agreste, MD  diphenoxylate-atropine (LOMOTIL) 2.5-0.025 MG per tablet Take 1 tablet by mouth 4 (four) times daily as needed for diarrhea or loose stools. 03/06/13  Yes Blanchie Dessert, MD  Loperamide HCl (IMODIUM A-D PO) Take 1-2 tablets by mouth as needed.   Yes Historical Provider, MD  LORazepam (ATIVAN) 2 MG tablet Take 2-4 mg by mouth daily as needed for anxiety.   Yes Historical Provider, MD  Multiple Vitamin (MULITIVITAMIN WITH MINERALS) TABS Take 1 tablet by mouth daily.    Yes Historical Provider, MD  potassium chloride SA (K-DUR,KLOR-CON) 20 MEQ tablet Take 2 tablets (40 mEq total) by mouth daily. 03/12/13  Yes Babette Relic, MD  PRESCRIPTION MEDICATION Take 9 mg by mouth daily. Take 3 pills (3 mg each) once per day entecort   Yes Historical Provider, MD  promethazine (PHENERGAN) 25 MG tablet Take 1 tablet (25 mg total) by mouth every 6 (six) hours as needed for nausea. 05/09/13  Yes Charles B. Karle Starch, MD  Vitamin D, Ergocalciferol, (DRISDOL) 50000 UNITS CAPS Take 50,000 Units by mouth every Monday. Take on monday   Yes Historical Provider, MD   BP 125/83  Pulse 76  Temp(Src) 98.6 F (37 C) (Oral)  Resp 20  SpO2 96%  LMP 09/16/2012 Physical Exam  Nursing note and vitals reviewed. Constitutional: She is oriented to person, place, and time. She appears well-developed and well-nourished. No distress.  Awake, alert, nontoxic appearance  HENT:  Head: Normocephalic and atraumatic.  Mouth/Throat: Oropharynx is clear and moist. No oropharyngeal exudate.  Eyes: Conjunctivae are normal. No scleral icterus.  Neck: Normal range of motion. Neck supple.  Cardiovascular: Normal rate, regular rhythm, normal heart sounds and intact distal pulses.   No murmur heard. Pulmonary/Chest: Effort normal and breath sounds normal. No respiratory distress. She has no wheezes.    Abdominal: Soft. Bowel sounds are normal. She exhibits no mass. There is no hepatosplenomegaly. There is tenderness in the right lower quadrant, suprapubic area and left lower quadrant. There is no rebound, no guarding, no CVA tenderness, no tenderness at McBurney's point and negative Murphy's sign. Hernia confirmed negative in the right inguinal area and confirmed negative in the left inguinal area.    Right lower quadrant, left lower quadrant and suprapubic abdominal tenderness without rebound or guarding No peritoneal signs   Genitourinary: Uterus normal. Pelvic exam was performed with patient supine. There is no rash, tenderness or lesion on the right labia. There is no rash, tenderness or lesion on the left labia. Uterus is not deviated, not enlarged, not fixed and not tender. Cervix exhibits no motion tenderness, no discharge and no friability. Right adnexum displays no mass, no tenderness and no fullness. Left adnexum displays no mass, no tenderness and no fullness. There is bleeding around the vagina. No erythema or tenderness around the vagina. No foreign body around the vagina. No signs of injury around the vagina. No vaginal discharge found.  Vaginal bleeding from the  cervical os No cervical motion tenderness No adnexal fullness or tenderness to palpation  Musculoskeletal: Normal range of motion. She exhibits no edema.  Lymphadenopathy:    She has no cervical adenopathy.       Right: No inguinal adenopathy present.       Left: No inguinal adenopathy present.  Neurological: She is alert and oriented to person, place, and time. Coordination normal.  Speech is clear and goal oriented Moves extremities without ataxia  Skin: Skin is warm and dry. No rash noted. She is not diaphoretic. No pallor.  Psychiatric: She has a normal mood and affect.    ED Course  Procedures (including critical care time) Labs Review Labs Reviewed  WET PREP, GENITAL - Abnormal; Notable for the following:     Clue Cells Wet Prep HPF POC MODERATE (*)    WBC, Wet Prep HPF POC FEW (*)    All other components within normal limits  CBC WITH DIFFERENTIAL - Abnormal; Notable for the following:    RBC 3.80 (*)    MCV 106.6 (*)    MCH 36.1 (*)    All other components within normal limits  COMPREHENSIVE METABOLIC PANEL - Abnormal; Notable for the following:    Potassium 3.5 (*)    All other components within normal limits  URINALYSIS, ROUTINE W REFLEX MICROSCOPIC - Abnormal; Notable for the following:    Hgb urine dipstick LARGE (*)    All other components within normal limits  URINE MICROSCOPIC-ADD ON - Abnormal; Notable for the following:    Bacteria, UA FEW (*)    All other components within normal limits  GC/CHLAMYDIA PROBE AMP    Imaging Review US Transvaginal Non-ob  08/07/2013   CLINICAL DATA:  Postmenopausal vaginal bleeding.  EXAM: TRANSABDOMINAL AND TRANSVAGINAL ULTRASOUND OF PELVIS  TECHNIQUE: Both transabdominal and transvaginal ultrasound examinations of the pelvis were performed. Transabdominal technique was performed for global imaging of the pelvis including uterus, ovaries, adnexal regions, and pelvic cul-de-sac. It was necessary to proceed with endovaginal exam following the transabdominal exam to visualize the endometrium and adnexal structures.  COMPARISON:  US ABDOMEN COMPLETE dated 10/13/2012; US RENAL dated 05/25/2012  FINDINGS: Uterus  Measurements: 99 mm x 48 mm x 53 mm. Heterogeneous myometrial echotexture suggesting adenomyosis. Echogenic 14 mm x 16 mm x 10 mm right fundal fibroid.  Endometrium  Thickness: 6.6 mm, normal.  No focal abnormality visualized.  Right ovary  Measurements: 24 mm x 18 mm x 20 mm. Normal appearance/no adnexal mass.  Left ovary  Measurements: 28 mm x 21 mm x 18 mm. Normal appearance/no adnexal mass.  Other findings  No free fluid.  IMPRESSION: 1. Normal endometrium. 2. Single right fundal fibroid.   Electronically Signed   By: Dereck Ligas M.D.   On:  08/07/2013 20:17   US Pelvis Complete  08/07/2013   CLINICAL DATA:  Postmenopausal vaginal bleeding.  EXAM: TRANSABDOMINAL AND TRANSVAGINAL ULTRASOUND OF PELVIS  TECHNIQUE: Both transabdominal and transvaginal ultrasound examinations of the pelvis were performed. Transabdominal technique was performed for global imaging of the pelvis including uterus, ovaries, adnexal regions, and pelvic cul-de-sac. It was necessary to proceed with endovaginal exam following the transabdominal exam to visualize the endometrium and adnexal structures.  COMPARISON:  US ABDOMEN COMPLETE dated 10/13/2012; US RENAL dated 05/25/2012  FINDINGS: Uterus  Measurements: 99 mm x 48 mm x 53 mm. Heterogeneous myometrial echotexture suggesting adenomyosis. Echogenic 14 mm x 16 mm x 10 mm right fundal fibroid.  Endometrium  Thickness: 6.6  mm, normal.  No focal abnormality visualized.  Right ovary  Measurements: 24 mm x 18 mm x 20 mm. Normal appearance/no adnexal mass.  Left ovary  Measurements: 28 mm x 21 mm x 18 mm. Normal appearance/no adnexal mass.  Other findings  No free fluid.  IMPRESSION: 1. Normal endometrium. 2. Single right fundal fibroid.   Electronically Signed   By: Dereck Ligas M.D.   On: 08/07/2013 20:17   Ct Abdomen Pelvis W Contrast  08/07/2013   CLINICAL DATA:  Pelvic pain and cramping, excessive vaginal bleeding, 2 years post menopause, past history collagenous colitis, pancreatitis, irritable bowel syndrome, kidney stones  EXAM: CT ABDOMEN AND PELVIS WITH CONTRAST  TECHNIQUE: Multidetector CT imaging of the abdomen and pelvis was performed using the standard protocol following bolus administration of intravenous contrast. Sagittal and coronal MPR images reconstructed from axial data set.  CONTRAST:  131mL OMNIPAQUE IOHEXOL 300 MG/ML  SOLN  COMPARISON:  03/02/2012 ; correlation pelvic ultrasound 08/07/2013  FINDINGS: Lung bases clear.  Gallbladder surgically absent.  Minimal central intrahepatic biliary dilatation consistent  with cholecystectomy, unchanged.  Liver, spleen, pancreas, and adrenal glands normal appearance.  Symmetric nephrograms with BILATERAL nonobstructing renal calculi.  Stomach decompressed, unable to accurately assess gastric wall thickness.  Prior tubal ligation with small right-side uterine leiomyoma 17 mm diameter.  Minimal fluid within endometrial canal.  Colon is under distended and unopacified, suboptimally assessed.  Bowel loops otherwise unremarkable.  No additional mass, adenopathy, free fluid, or free air.  Normal appendix.  Unremarkable bladder and ureters.  No acute osseous findings.  IMPRESSION: Stable intrahepatic biliary dilatation.  BILATERAL nonobstructing renal calculi.  Minimal fluid within endometrial canal.  Small uterine leiomyoma 17 mm diameter.  No other definite intra-abdominal or intrapelvic abnormalities.   Electronically Signed   By: Lavonia Dana M.D.   On: 08/07/2013 21:55     EKG Interpretation None      MDM   Final diagnoses:  Abdominal pain  Chronic diarrhea  Postmenopausal vaginal bleeding   Dawn Frost presents with post menopausal vaginal bleeding.  Pt without OB/GYN.  Pt also with c/o colitis flare, but this is not her current concern. She reports she normally takes lomotil for this, but is out.  Pt with TTP of the RLQ, LLQ and suprapubic region of her abd.  Will check labs, give pain control, nausea control and image.  Pt is hemodynamically stable at this time.    Pt without evidence of UTI, or PID.  CBC without leukocytosis.  Pt with hx of hypokalemia, but K+ os 3.5 today.  Will replete with PO potassium.  Labs otherwise reassuring.  [place] of pelvis with evidence of fibroid but no large tumors seen. As pt was continuing to have abd pain (without emesis) here in the department, will obtain CT scan.  CT scan with bilateral nonobstructing renal calculi and a uterine fibroid 17 mm in diameter. Trauma of fluid seen in the endometrial canal which correlates clinically  with patient's vaginal bleeding.    I personally reviewed the imaging tests through PACS system  I reviewed available ER/hospitalization records through the EMR  11:22 PM Patient has remained hemodynamically stable here in the emergency department. She is without anemia or persistent tachycardia.  There is not enough bleeding on vaginal exam to create concern for hemodynamic instability.  At this time I cannot rule out endometrial carcinoma however patient will need to followup with OB/GYN for endometrial biopsy. I have discussed this at length with her.  I discussed the need for timely followup. I have also discussed reasons to return here to the emergency department including lightheadedness, syncope or an increase in her vaginal bleeding.  It has been determined that no acute conditions requiring further emergency intervention are present at this time. The patient/guardian have been advised of the diagnosis and plan. We have discussed signs and symptoms that warrant return to the ED, such as changes or worsening in symptoms.   Vital signs are stable at discharge.   BP 125/83  Pulse 76  Temp(Src) 98.6 F (37 C) (Oral)  Resp 20  SpO2 96%  LMP 09/16/2012  Patient/guardian has voiced understanding and agreed to follow-up with the PCP or specialist.      Abigail Butts, PA-C 08/07/13 2324

## 2013-08-07 NOTE — ED Notes (Signed)
Patient transported to CT 

## 2013-08-07 NOTE — ED Notes (Signed)
Pt rates pain at 7/10.  Medicated for pain as ordered

## 2013-08-07 NOTE — ED Notes (Signed)
Pt care assumed, obtained verbal report.  Pt resting comfortably, waiting for [place]

## 2013-08-08 ENCOUNTER — Inpatient Hospital Stay (EMERGENCY_DEPARTMENT_HOSPITAL)
Admission: AD | Admit: 2013-08-08 | Discharge: 2013-08-08 | Disposition: A | Discharge status: Home / Self Care | Payer: BC Managed Care – PPO | Source: Ambulatory Visit | Attending: Obstetrics and Gynecology | Admitting: Obstetrics and Gynecology

## 2013-08-08 ENCOUNTER — Encounter: Payer: Self-pay | Admitting: Obstetrics & Gynecology

## 2013-08-08 ENCOUNTER — Encounter (HOSPITAL_COMMUNITY): Payer: Self-pay | Admitting: *Deleted

## 2013-08-08 ENCOUNTER — Ambulatory Visit (INDEPENDENT_AMBULATORY_CARE_PROVIDER_SITE_OTHER): Payer: BC Managed Care – PPO | Admitting: Obstetrics & Gynecology

## 2013-08-08 VITALS — BP 117/77 | HR 92 | Resp 16 | Ht 64.0 in | Wt 110.0 lb

## 2013-08-08 DIAGNOSIS — D259 Leiomyoma of uterus, unspecified: Secondary | ICD-10-CM

## 2013-08-08 DIAGNOSIS — N8000 Endometriosis of the uterus, unspecified: Secondary | ICD-10-CM

## 2013-08-08 DIAGNOSIS — N809 Endometriosis, unspecified: Secondary | ICD-10-CM

## 2013-08-08 DIAGNOSIS — Z124 Encounter for screening for malignant neoplasm of cervix: Secondary | ICD-10-CM

## 2013-08-08 DIAGNOSIS — N950 Postmenopausal bleeding: Secondary | ICD-10-CM

## 2013-08-08 DIAGNOSIS — K529 Noninfective gastroenteritis and colitis, unspecified: Secondary | ICD-10-CM

## 2013-08-08 DIAGNOSIS — Z1151 Encounter for screening for human papillomavirus (HPV): Secondary | ICD-10-CM

## 2013-08-08 DIAGNOSIS — Z01419 Encounter for gynecological examination (general) (routine) without abnormal findings: Secondary | ICD-10-CM

## 2013-08-08 DIAGNOSIS — G8929 Other chronic pain: Secondary | ICD-10-CM

## 2013-08-08 DIAGNOSIS — N8003 Adenomyosis of the uterus: Secondary | ICD-10-CM

## 2013-08-08 DIAGNOSIS — D219 Benign neoplasm of connective and other soft tissue, unspecified: Secondary | ICD-10-CM | POA: Insufficient documentation

## 2013-08-08 LAB — GC/CHLAMYDIA PROBE AMP
CT Probe RNA: NEGATIVE
GC Probe RNA: NEGATIVE

## 2013-08-08 MED ORDER — MEGESTROL ACETATE 20 MG PO TABS: 20.0000 mg | ORAL_TABLET | ORAL | Status: DC

## 2013-08-08 NOTE — [facility] Provider Note (Signed)
Attestation of Attending Supervision of Advanced Practitioner: Evaluation and management procedures were performed by the PA/NP/CNM/OB Fellow under my supervision/collaboration. Chart reviewed and agree with management and plan.  Jonnie Kind 08/08/2013 7:05 PM

## 2013-08-08 NOTE — Discharge Instructions (Signed)
Postmenopausal Bleeding Postmenopausal bleeding is any bleeding a woman has after she has entered into menopause. Menopause is the end of a woman's fertile years. After menopause, a woman no longer ovulates or has menstrual periods.  Postmenopausal bleeding can be caused by various things. Any type of postmenopausal bleeding, even if it appears to be a typical menstrual period, is concerning. This should be evaluated by your health care provider. Any treatment will depend on the cause of the bleeding. HOME CARE INSTRUCTIONS Monitor your condition for any changes. The following actions may help to alleviate any discomfort you are experiencing:  Avoid the use of tampons and douches as directed by your health care provider.  Change your pads frequently.  Get regular pelvic exams and Pap tests.  Keep all follow-up appointments for diagnostic tests as directed by your health care provider. SEEK MEDICAL CARE IF:   Your bleeding lasts more than 1 week.  You have abdominal pain.  You have bleeding with sexual intercourse. SEEK IMMEDIATE MEDICAL CARE IF:   You have a fever, chills, headache, dizziness, muscle aches, and bleeding.  You have severe pain with bleeding.  You are passing blood clots.  You have bleeding and need more than 1 pad an hour.  You feel faint. MAKE SURE YOU:  Understand these instructions.  Will watch your condition.  Will get help right away if you are not doing well or get worse. Document Released: 06/29/2005 Document Revised: 01/09/2013 Document Reviewed: 10/18/2012 [employer] Patient Information 2014 [employer].  

## 2013-08-08 NOTE — MAU Provider Note (Signed)
Chief Complaint: Vaginal Bleeding and Abdominal Cramping    First Provider Initiated Contact with Patient 08/08/2013 at [place].   SUBJECTIVE HPI: Dawn Frost is a 48 y.o. G32P3 postmenopausal female who presents with postmenopausal bleeding and cramping. Was seen at [facility] emergency department yesterday afternoon and had an extensive workup. See note. No emergent condition was identified. Patient was instructed to followup with OB/GYN for endometrial biopsy. Patient states she does not have a gynecologist so she came to [facility] hospital. Patient was given a prescription for Percocet and Phenergan and she was discharged from [facility] emergency department.  Past Medical History  Diagnosis Date  . Collagenous colitis   . WPW (Wolff-Parkinson-White syndrome)   . Kidney stone   . Addison disease   . Thyroid disease   . Clostridium difficile diarrhea   . Anxiety and depression   . Chronic diarrhea   . Chronic back pain   . Drug-seeking behavior   . Irritable bowel syndrome (IBS)   . Anemia   . Kidney stone   . Pancreatitis    OB History  Gravida Para Term Preterm AB SAB TAB Ectopic Multiple Living  3 3            # Outcome Date GA Lbr Len/2nd Weight Sex Delivery Anes PTL Lv  3 PAR           2 PAR           1 PAR              Past Surgical History  Procedure Laterality Date  . Cholecystectomy    . Tubal ligation    . Lithotripsy    . Sphincterotomy    . Cardiac electrophysiology mapping and ablation      for WPW  . Video bronchoscopy  08/31/2011    Procedure: VIDEO BRONCHOSCOPY WITH FLUORO;  Surgeon: Elsie Stain, MD;  Location: [facility];  Service: Cardiopulmonary;  Laterality: N/A;   History   Social History  . Marital Status: Married    Spouse Name: N/A    Number of Children: 3  . Years of Education: N/A   Occupational History  . Inventory in a warehouse    Social History Main Topics  . Smoking status: Current Every Day Smoker -- 0.25 packs/day for 30  years    Types: Cigarettes  . Smokeless tobacco: Never Used  . Alcohol Use: Yes     Comment: seldom  . Drug Use: No  . Sexual Activity: Not on file   Other Topics Concern  . Not on file   Social History Narrative  . No narrative on file   No current facility-administered medications on file prior to encounter.   Current Outpatient Prescriptions on File Prior to Encounter  Medication Sig Dispense Refill  . butalbital-acetaminophen-caffeine (FIORICET WITH CODEINE) 50-325-40-30 MG per capsule Take 1 capsule by mouth every 4 (four) hours as needed for migraine.  30 capsule  0  . Loperamide HCl (IMODIUM A-D PO) Take 1-2 tablets by mouth as needed.      Marland Kitchen LORazepam (ATIVAN) 2 MG tablet Take 2-4 mg by mouth daily as needed for anxiety.      . Multiple Vitamin (MULITIVITAMIN WITH MINERALS) TABS Take 1 tablet by mouth daily.       . potassium chloride SA (K-DUR,KLOR-CON) 20 MEQ tablet Take 2 tablets (40 mEq total) by mouth daily.  20 tablet  0  . promethazine (PHENERGAN) 25 MG tablet Take 1 tablet (25  mg total) by mouth every 6 (six) hours as needed for nausea.  15 tablet  0  . Vitamin D, Ergocalciferol, (DRISDOL) 50000 UNITS CAPS Take 50,000 Units by mouth every Monday. Take on monday      . diphenoxylate-atropine (LOMOTIL) 2.5-0.025 MG per tablet Take 1 tablet by mouth 4 (four) times daily as needed for diarrhea or loose stools.  15 tablet  0  . oxyCODONE-acetaminophen (PERCOCET/ROXICET) 5-325 MG per tablet Take 1-2 tablets by mouth every 4 (four) hours as needed for severe pain.  20 tablet  0  . PRESCRIPTION MEDICATION Take 9 mg by mouth daily. Take 3 pills (3 mg each) once per day entecort      . promethazine (PHENERGAN) 25 MG tablet Take 1 tablet (25 mg total) by mouth every 6 (six) hours as needed for nausea or vomiting.  12 tablet  0   Allergies  Allergen Reactions  . Prednisone Other (See Comments)    Hyperactivity and agitation  . Ambien [Zolpidem Tartrate] Other (See Comments)     headache  . Nsaids Other (See Comments)    Bad for colitis  . Zofran Other (See Comments)    Headache     ROS: Pertinent items in HPI.   OBJECTIVE Blood pressure 116/79, pulse 103, resp. rate 16, height 5\' 5"  (1.651 m), weight 51.075 kg (112 lb 9.6 oz), last menstrual period 10/17/2011.  GENERAL: Well-developed, well-nourished female in mild distress. Unusual affect. Dark skin.  HEENT: Normocephalic HEART: normal rate RESP: normal effort ABDOMEN: Deferred NEURO: Alert and oriented SPECULUM EXAM: Deferred. Small amount of blood on pad.  LAB RESULTS Results for orders placed during the hospital encounter of 08/07/13 (from the past 24 hour(s))  CBC WITH DIFFERENTIAL     Status: Abnormal   Collection Time    08/07/13  5:55 PM      Result Value Ref Range   WBC 9.6  4.0 - 10.5 K/uL   RBC 3.80 (*) 3.87 - 5.11 MIL/uL   Hemoglobin 13.7  12.0 - 15.0 g/dL   HCT 40.5  36.0 - 46.0 %   MCV 106.6 (*) 78.0 - 100.0 fL   MCH 36.1 (*) 26.0 - 34.0 pg   MCHC 33.8  30.0 - 36.0 g/dL   RDW 13.2  11.5 - 15.5 %   Platelets 214  150 - 400 K/uL   Neutrophils Relative % 71  43 - 77 %   Neutro Abs 6.8  1.7 - 7.7 K/uL   Lymphocytes Relative 22  12 - 46 %   Lymphs Abs 2.1  0.7 - 4.0 K/uL   Monocytes Relative 7  3 - 12 %   Monocytes Absolute 0.6  0.1 - 1.0 K/uL   Eosinophils Relative 1  0 - 5 %   Eosinophils Absolute 0.1  0.0 - 0.7 K/uL   Basophils Relative 0  0 - 1 %   Basophils Absolute 0.0  0.0 - 0.1 K/uL  COMPREHENSIVE METABOLIC PANEL     Status: Abnormal   Collection Time    08/07/13  5:55 PM      Result Value Ref Range   Sodium 140  137 - 147 mEq/L   Potassium 3.5 (*) 3.7 - 5.3 mEq/L   Chloride 101  96 - 112 mEq/L   CO2 22  19 - 32 mEq/L   Glucose, Bld 91  70 - 99 mg/dL   BUN 16  6 - 23 mg/dL   Creatinine, Ser 0.78  0.50 - 1.10 mg/dL  Calcium 9.3  8.4 - 10.5 mg/dL   Total Protein 7.2  6.0 - 8.3 g/dL   Albumin 4.2  3.5 - 5.2 g/dL   AST 15  0 - 37 U/L   ALT 12  0 - 35 U/L   Alkaline  Phosphatase 105  39 - 117 U/L   Total Bilirubin 0.3  0.3 - 1.2 mg/dL   GFR calc non Af Amer >90  >90 mL/min   GFR calc Af Amer >90  >90 mL/min  WET PREP, GENITAL     Status: Abnormal   Collection Time    08/07/13  7:05 PM      Result Value Ref Range   Yeast Wet Prep HPF POC NONE SEEN  NONE SEEN   Trich, Wet Prep NONE SEEN  NONE SEEN   Clue Cells Wet Prep HPF POC MODERATE (*) NONE SEEN   WBC, Wet Prep HPF POC FEW (*) NONE SEEN  URINALYSIS, ROUTINE W REFLEX MICROSCOPIC     Status: Abnormal   Collection Time    08/07/13  7:50 PM      Result Value Ref Range   Color, Urine YELLOW  YELLOW   APPearance CLEAR  CLEAR   Specific Gravity, Urine 1.029  1.005 - 1.030   pH 5.5  5.0 - 8.0   Glucose, UA NEGATIVE  NEGATIVE mg/dL   Hgb urine dipstick LARGE (*) NEGATIVE   Bilirubin Urine NEGATIVE  NEGATIVE   Ketones, ur NEGATIVE  NEGATIVE mg/dL   Protein, ur NEGATIVE  NEGATIVE mg/dL   Urobilinogen, UA 0.2  0.0 - 1.0 mg/dL   Nitrite NEGATIVE  NEGATIVE   Leukocytes, UA NEGATIVE  NEGATIVE  URINE MICROSCOPIC-ADD ON     Status: Abnormal   Collection Time    08/07/13  7:50 PM      Result Value Ref Range   Squamous Epithelial / LPF RARE  RARE   WBC, UA 0-2  <3 WBC/hpf   RBC / HPF 7-10  <3 RBC/hpf   Bacteria, UA FEW (*) RARE    IMAGING US Transvaginal Non-ob  08/07/2013   CLINICAL DATA:  Postmenopausal vaginal bleeding.  EXAM: TRANSABDOMINAL AND TRANSVAGINAL ULTRASOUND OF PELVIS  TECHNIQUE: Both transabdominal and transvaginal ultrasound examinations of the pelvis were performed. Transabdominal technique was performed for global imaging of the pelvis including uterus, ovaries, adnexal regions, and pelvic cul-de-sac. It was necessary to proceed with endovaginal exam following the transabdominal exam to visualize the endometrium and adnexal structures.  COMPARISON:  US ABDOMEN COMPLETE dated 10/13/2012; US RENAL dated 05/25/2012  FINDINGS: Uterus  Measurements: 99 mm x 48 mm x 53 mm. Heterogeneous myometrial  echotexture suggesting adenomyosis. Echogenic 14 mm x 16 mm x 10 mm right fundal fibroid.  Endometrium  Thickness: 6.6 mm, normal.  No focal abnormality visualized.  Right ovary  Measurements: 24 mm x 18 mm x 20 mm. Normal appearance/no adnexal mass.  Left ovary  Measurements: 28 mm x 21 mm x 18 mm. Normal appearance/no adnexal mass.  Other findings  No free fluid.  IMPRESSION: 1. Normal endometrium. 2. Single right fundal fibroid.   Electronically Signed   By: Dereck Ligas M.D.   On: 08/07/2013 20:17   US Pelvis Complete  08/07/2013   CLINICAL DATA:  Postmenopausal vaginal bleeding.  EXAM: TRANSABDOMINAL AND TRANSVAGINAL ULTRASOUND OF PELVIS  TECHNIQUE: Both transabdominal and transvaginal ultrasound examinations of the pelvis were performed. Transabdominal technique was performed for global imaging of the pelvis including uterus, ovaries, adnexal regions, and pelvic cul-de-sac.  It was necessary to proceed with endovaginal exam following the transabdominal exam to visualize the endometrium and adnexal structures.  COMPARISON:  US ABDOMEN COMPLETE dated 10/13/2012; US RENAL dated 05/25/2012  FINDINGS: Uterus  Measurements: 99 mm x 48 mm x 53 mm. Heterogeneous myometrial echotexture suggesting adenomyosis. Echogenic 14 mm x 16 mm x 10 mm right fundal fibroid.  Endometrium  Thickness: 6.6 mm, normal.  No focal abnormality visualized.  Right ovary  Measurements: 24 mm x 18 mm x 20 mm. Normal appearance/no adnexal mass.  Left ovary  Measurements: 28 mm x 21 mm x 18 mm. Normal appearance/no adnexal mass.  Other findings  No free fluid.  IMPRESSION: 1. Normal endometrium. 2. Single right fundal fibroid.   Electronically Signed   By: Dereck Ligas M.D.   On: 08/07/2013 20:17   Ct Abdomen Pelvis W Contrast  08/07/2013   CLINICAL DATA:  Pelvic pain and cramping, excessive vaginal bleeding, 2 years post menopause, past history collagenous colitis, pancreatitis, irritable bowel syndrome, kidney stones  EXAM: CT ABDOMEN  AND PELVIS WITH CONTRAST  TECHNIQUE: Multidetector CT imaging of the abdomen and pelvis was performed using the standard protocol following bolus administration of intravenous contrast. Sagittal and coronal MPR images reconstructed from axial data set.  CONTRAST:  170mL OMNIPAQUE IOHEXOL 300 MG/ML  SOLN  COMPARISON:  03/02/2012 ; correlation pelvic ultrasound 08/07/2013  FINDINGS: Lung bases clear.  Gallbladder surgically absent.  Minimal central intrahepatic biliary dilatation consistent with cholecystectomy, unchanged.  Liver, spleen, pancreas, and adrenal glands normal appearance.  Symmetric nephrograms with BILATERAL nonobstructing renal calculi.  Stomach decompressed, unable to accurately assess gastric wall thickness.  Prior tubal ligation with small right-side uterine leiomyoma 17 mm diameter.  Minimal fluid within endometrial canal.  Colon is under distended and unopacified, suboptimally assessed.  Bowel loops otherwise unremarkable.  No additional mass, adenopathy, free fluid, or free air.  Normal appendix.  Unremarkable bladder and ureters.  No acute osseous findings.  IMPRESSION: Stable intrahepatic biliary dilatation.  BILATERAL nonobstructing renal calculi.  Minimal fluid within endometrial canal.  Small uterine leiomyoma 17 mm diameter.  No other definite intra-abdominal or intrapelvic abnormalities.   Electronically Signed   By: Lavonia Dana M.D.   On: 08/07/2013 21:55   MAU COURSE Informed patient that emergency departments do not have endometrial biopsies. [facility]. Pt disappointed. Wants to know why she wasn't admitted to the hospital at [facility]. Explained that they did not identify any condition that required inpatient hospitalization. Pt stated that between her chronic diarrhea, chronic nausea, chronic back pain new-onset of vaginal bleeding and cramping she is feeling bad and thinks that she should be in the hospital. No  vomiting or prescription to the bathroom while in MAU. Has a prescription for Percocet and Phenergan from ED visit yesterday. CNM expressed empathy, but explained that these chronic and manageable problems did not require hospitalization.  ASSESSMENT 1. Postmenopausal bleeding   2. Chronic pain   3. Chronic diarrhea    PLAN Discharge home in stable condition per consult with Dr. Glo Herring. Bleeding precautions.     Follow-up Information   Follow up with [facility]. (Will call you to schedule a Endometrial biopsy)    Specialty:  Obstetrics and Gynecology   Contact information:   [place], [place] [address] [phone]      Follow up with [facility]. (As needed in emergencies)  Contact information:   [place] [address]       Follow up with [facility]. (For heavy vaginal bleeding)    Contact information:   [id number] [place] [address] [phone]       Medication List    STOP taking these medications       diphenoxylate-atropine 2.5-0.025 MG per tablet  Commonly known as:  LOMOTIL      TAKE these medications       butalbital-acetaminophen-caffeine 50-325-40-30 MG per capsule  Commonly known as:  FIORICET WITH CODEINE  Take 1 capsule by mouth every 4 (four) hours as needed for migraine.     escitalopram 20 MG tablet  Commonly known as:  LEXAPRO  Take 20 mg by mouth daily.     IMODIUM A-D PO  Take 1-2 tablets by mouth as needed.     LORazepam 2 MG tablet  Commonly known as:  ATIVAN  Take 2-4 mg by mouth daily as needed for anxiety.     megestrol 20 MG tablet  Commonly known as:  MEGACE  - Take 1 tablet (20 mg total) by mouth as directed. Take two tablets (40 mg) three times per day time three days,   - then take two tablets (40 mg) two times per day time three days,   - then take two tablets (40  mg)once per day     multivitamin with minerals Tabs tablet  Take 1 tablet by mouth daily.     oxyCODONE-acetaminophen 5-325 MG per tablet  Commonly known as:  PERCOCET/ROXICET  Take 1-2 tablets by mouth every 4 (four) hours as needed for severe pain.     potassium chloride SA 20 MEQ tablet  Commonly known as:  K-DUR,KLOR-CON  Take 2 tablets (40 mEq total) by mouth daily.     PRESCRIPTION MEDICATION  - Take 9 mg by mouth daily. Take 3 pills (3 mg each) once per day  - entecort     promethazine 25 MG tablet  Commonly known as:  PHENERGAN  Take 1 tablet (25 mg total) by mouth every 6 (six) hours as needed for nausea.     promethazine 25 MG tablet  Commonly known as:  PHENERGAN  Take 1 tablet (25 mg total) by mouth every 6 (six) hours as needed for nausea or vomiting.     Vitamin D (Ergocalciferol) 50000 UNITS Caps capsule  Commonly known as:  DRISDOL  Take 50,000 Units by mouth every Monday. Take on monday       Bernece Gall, [place] 08/08/2013  1:52 AM

## 2013-08-08 NOTE — Patient Instructions (Addendum)
Adenomyosis and Fibroids are your diagnoses   Endometrial Ablation Endometrial ablation removes the lining of the uterus (endometrium). It is usually a same-day, outpatient treatment. Ablation helps avoid major surgery, such as surgery to remove the cervix and uterus (hysterectomy). After endometrial ablation, you will have little or no menstrual bleeding and may not be able to have children. However, if you are premenopausal, you will need to use a reliable method of birth control following the procedure because of the small chance that pregnancy can occur. There are different reasons to have this procedure, which include:  Heavy periods.  Bleeding that is causing anemia.  Irregular bleeding.  Bleeding fibroids on the lining inside the uterus if they are smaller than 3 centimeters. This procedure should not be done if:  You want children in the future.  You have severe cramps with your menstrual period.  You have precancerous or cancerous cells in your uterus.  You were recently pregnant.  You have gone through menopause.  You have had major surgery on the uterus, such as a cesarean delivery. LET [facility] CARE PROVIDER KNOW ABOUT:  Any allergies you have.  All medicines you are taking, including vitamins, herbs, eye drops, creams, and over-the-counter medicines.  Previous problems you or members of your family have had with the use of anesthetics.  Any blood disorders you have.  Previous surgeries you have had.  Medical conditions you have. RISKS AND COMPLICATIONS  Generally, this is a safe procedure. However, as with any procedure, complications can occur. Possible complications include:  Perforation of the uterus.  Bleeding.  Infection of the uterus, bladder, or vagina.  Injury to surrounding organs.  An air bubble to the lung (air embolus).  Pregnancy following the procedure.  Failure of the procedure to help the problem, requiring  hysterectomy.  Decreased ability to diagnose cancer in the lining of the uterus. BEFORE THE PROCEDURE  The lining of the uterus must be tested to make sure there is no pre-cancerous or cancer cells present.  An ultrasound may be performed to look at the size of the uterus and to check for abnormalities.  Medicines may be given to thin the lining of the uterus. PROCEDURE  During the procedure, your health care provider will use a tool called a resectoscope to help see inside your uterus. There are different ways to remove the lining of your uterus.   Radiofrequency  This method uses a radiofrequency-alternating electric current to remove the lining of the uterus.  Cryotherapy This method uses extreme cold to freeze the lining of the uterus.  Heated-Free Liquid  This method uses heated salt (saline) solution to remove the lining of the uterus.  Microwave This method uses high-energy microwaves to heat up the lining of the uterus to remove it.  Thermal balloon  This method involves inserting a catheter with a balloon tip into the uterus. The balloon tip is filled with heated fluid to remove the lining of the uterus. AFTER THE PROCEDURE  After your procedure, do not have sexual intercourse or insert anything into your vagina until permitted by your health care provider. After the procedure, you may experience:  Cramps.  Vaginal discharge.  Frequent urination. Document Released: 01/29/2004 Document Revised: 11/21/2012 Document Reviewed: 08/22/2012 [facility] Patient Information 2014 [place]. Hysterectomy Information  A hysterectomy is a surgery in which your uterus is removed. This surgery may be done to treat various medical problems. After the surgery, you will no longer have menstrual periods. The surgery  will also make you unable to become pregnant (sterile). The fallopian tubes and ovaries can be removed (bilateral salpingo-oophorectomy) during this surgery as well.  REASONS  FOR A HYSTERECTOMY  Persistent, abnormal bleeding.  Lasting (chronic) pelvic pain or infection.  The lining of the uterus (endometrium) starts growing outside the uterus (endometriosis).  The endometrium starts growing in the muscle of the uterus (adenomyosis).  The uterus falls down into the vagina (pelvic organ prolapse).  Noncancerous growths in the uterus (uterine fibroids) that cause symptoms.  Precancerous cells.  Cervical cancer or uterine cancer. TYPES OF HYSTERECTOMIES  Supracervical hysterectomy In this type, the top part of the uterus is removed, but not the cervix.  Total hysterectomy The uterus and cervix are removed.  Radical hysterectomy The uterus, the cervix, and the fibrous tissue that holds the uterus in place in the pelvis (parametrium) are removed. WAYS A HYSTERECTOMY CAN BE PERFORMED  Abdominal hysterectomy A large surgical cut (incision) is made in the abdomen. The uterus is removed through this incision.  Vaginal hysterectomy An incision is made in the vagina. The uterus is removed through this incision. There are no abdominal incisions.  Conventional laparoscopic hysterectomy Three or four small incisions are made in the abdomen. A thin, lighted tube with a camera (laparoscope) is inserted into one of the incisions. Other tools are put through the other incisions. The uterus is cut into small pieces. The small pieces are removed through the incisions, or they are removed through the vagina.  Laparoscopically assisted vaginal hysterectomy (LAVH) Three or four small incisions are made in the abdomen. Part of the surgery is performed laparoscopically and part vaginally. The uterus is removed through the vagina.  Robot-assisted laparoscopic hysterectomy A laparoscope and other tools are inserted into 3 or 4 small incisions in the abdomen. A computer-controlled device is used to give the surgeon a 3D image and to help control the surgical instruments. This allows  for more precise movements of surgical instruments. The uterus is cut into small pieces and removed through the incisions or removed through the vagina. RISKS AND COMPLICATIONS  Possible complications associated with this procedure include:  Bleeding and risk of blood transfusion. Tell your health care provider if you do not want to receive any blood products.  Blood clots in the legs or lung.  Infection.  Injury to surrounding organs.  Problems or side effects related to anesthesia.  Conversion to an abdominal hysterectomy from one of the other techniques. WHAT TO EXPECT AFTER A HYSTERECTOMY  You will be given pain medicine.  You will need to have someone with you for the first 3 5 days after you go home.  You will need to follow up with your surgeon in 2 4 weeks after surgery to evaluate your progress.  You may have early menopause symptoms such as hot flashes, night sweats, and insomnia.  If you had a hysterectomy for a problem that was not cancer or not a condition that could lead to cancer, then you no longer need Pap tests. However, even if you no longer need a Pap test, a regular exam is a good idea to make sure no other problems are starting. Document Released: 09/14/2000 Document Revised: 01/09/2013 Document Reviewed: 11/26/2012 [facility] Patient Information 2014 [place].

## 2013-08-08 NOTE — Progress Notes (Signed)
CLINIC ENCOUNTER NOTE  History:  48 y.o. [id number] here today for evaluation of postmenopausal bleeding.  She became postmenopausal at age 83 and had two years without bleeding.  This episode of bleeding has lasted a few days and is associated with cramping.  Patient does not remember when she last had a pap smear; she had been seen by her GI doctor for multiple flares of Crohn's coliitis. She was seen in the MAU last night and given Megace and Percocet (cannot take NSAIDs). Denies any anemia symptoms.    The following portions of the patient's history were reviewed and updated as appropriate: allergies, current medications, past family history, past medical history, past social history, past surgical history and problem list.  Review of Systems:  Pertinent items are noted in HPI.  Objective:  Physical Exam BP 117/77  Pulse 92  Resp 16  Ht 5\' 4"  (1.626 m)  Wt 110 lb (49.896 kg)  BMI 18.87 kg/m2  LMP 10/17/2011 Gen: NAD Abd: Soft, nontender and nondistended Pelvic: Normal appearing external genitalia; normal appearing vaginal mucosa and cervix. Pap performed.  Small amount of blood in vault; slow trickle of bleeding.  Small uterus, no other palpable masses, no uterine or adnexal tenderness  ENDOMETRIAL BIOPSY     The indications for endometrial biopsy were reviewed.   Risks of the biopsy including cramping, bleeding, infection, uterine perforation, inadequate specimen and need for additional procedures  were discussed. The patient states she understands and agrees to undergo procedure today. Consent was signed. Time out was performed. Urine HCG was negative. During the pelvic exam, the cervix was prepped with Betadine. A single-toothed tenaculum was placed on the anterior lip of the cervix to stabilize it. The 3 mm pipelle was introduced into the endometrial cavity without difficulty to a depth of 9 cm, and a moderate amount of tissue was obtained and sent to pathology. The instruments  were removed from the patient's vagina. Minimal bleeding from the cervix was noted. The patient tolerated the procedure well. Routine post-procedure instructions were given to the patient.    Labs and Imaging 08/07/2013  TRANSABDOMINAL AND TRANSVAGINAL ULTRASOUND OF PELVIS  CLINICAL DATA:  Postmenopausal vaginal bleeding TECHNIQUE: Both transabdominal and transvaginal ultrasound examinations of the pelvis were performed. Transabdominal technique was performed for global imaging of the pelvis including uterus, ovaries, adnexal regions, and pelvic cul-de-sac. It was necessary to proceed with endovaginal exam following the transabdominal exam to visualize the endometrium and adnexal structures.  COMPARISON:  US ABDOMEN COMPLETE dated 10/13/2012; US RENAL dated 05/25/2012  FINDINGS: Uterus  Measurements: 99 mm x 48 mm x 53 mm. Heterogeneous myometrial echotexture suggesting adenomyosis. Echogenic 14 mm x 16 mm x 10 mm right fundal fibroid.  Endometrium  Thickness: 6.6 mm, normal.  No focal abnormality visualized.  Right ovary  Measurements: 24 mm x 18 mm x 20 mm. Normal appearance/no adnexal mass.  Left ovary  Measurements: 28 mm x 21 mm x 18 mm. Normal appearance/no adnexal mass.  Other findings  No free fluid.  IMPRESSION: 1. Normal endometrium. 2. Single right fundal fibroid.   Electronically Signed   By: Dereck Ligas M.D.   On: 08/07/2013 20:17   08/07/2013   CT ABDOMEN AND PELVIS WITH CONTRAST  CLINICAL DATA:  Pelvic pain and cramping, excessive vaginal bleeding, 2 years post menopause, past history collagenous colitis, pancreatitis, irritable bowel syndrome, kidney stones  TECHNIQUE: Multidetector CT imaging of the abdomen and pelvis was performed using the standard protocol following bolus administration  of intravenous contrast. Sagittal and coronal MPR images reconstructed from axial data set.  CONTRAST:  132mL OMNIPAQUE IOHEXOL 300 MG/ML  SOLN  COMPARISON:  03/02/2012 ; correlation pelvic ultrasound  08/07/2013  FINDINGS: Lung bases clear.  Gallbladder surgically absent.  Minimal central intrahepatic biliary dilatation consistent with cholecystectomy, unchanged.  Liver, spleen, pancreas, and adrenal glands normal appearance.  Symmetric nephrograms with BILATERAL nonobstructing renal calculi.  Stomach decompressed, unable to accurately assess gastric wall thickness.  Prior tubal ligation with small right-side uterine leiomyoma 17 mm diameter.  Minimal fluid within endometrial canal.  Colon is under distended and unopacified, suboptimally assessed.  Bowel loops otherwise unremarkable.  No additional mass, adenopathy, free fluid, or free air.  Normal appendix.  Unremarkable bladder and ureters.  No acute osseous findings.  IMPRESSION: Stable intrahepatic biliary dilatation.  BILATERAL nonobstructing renal calculi.  Minimal fluid within endometrial canal.  Small uterine leiomyoma 17 mm diameter.  No other definite intra-abdominal or intrapelvic abnormalities.   Electronically Signed   By: Lavonia Dana M.D.   On: 08/07/2013 21:55    Assessment & Plan:  Follow up endometrial biopsy and pap smear  Continue Megace for now Discussed treatment for adenomyosis and the small leiomyoma; continued medical treatment, surgical options discussed.   Patient cautioned about surgery; her Crohn's may have caused adhesions which may render surgery difficult.  Patient will decide and let us know. Bleeding precautions reviewed    Verita Schneiders, MD, [facility] Attending [place], [place]

## 2013-08-08 NOTE — MAU Note (Signed)
Pt G3 P3, menopause 2 yrs ago, tonight having vaginal bleeding and cramping.

## 2013-08-12 ENCOUNTER — Encounter (HOSPITAL_COMMUNITY): Payer: Self-pay | Admitting: Emergency Medicine

## 2013-08-12 ENCOUNTER — Emergency Department (HOSPITAL_COMMUNITY)
Admission: EM | Admit: 2013-08-12 | Discharge: 2013-08-12 | Disposition: A | Discharge status: Home / Self Care | Payer: BC Managed Care – PPO | Attending: Emergency Medicine | Admitting: Emergency Medicine

## 2013-08-12 DIAGNOSIS — Z8619 Personal history of other infectious and parasitic diseases: Secondary | ICD-10-CM | POA: Insufficient documentation

## 2013-08-12 DIAGNOSIS — Z3202 Encounter for pregnancy test, result negative: Secondary | ICD-10-CM | POA: Insufficient documentation

## 2013-08-12 DIAGNOSIS — F17200 Nicotine dependence, unspecified, uncomplicated: Secondary | ICD-10-CM | POA: Insufficient documentation

## 2013-08-12 DIAGNOSIS — N949 Unspecified condition associated with female genital organs and menstrual cycle: Secondary | ICD-10-CM | POA: Insufficient documentation

## 2013-08-12 DIAGNOSIS — N8003 Adenomyosis of the uterus: Secondary | ICD-10-CM

## 2013-08-12 DIAGNOSIS — F411 Generalized anxiety disorder: Secondary | ICD-10-CM | POA: Insufficient documentation

## 2013-08-12 DIAGNOSIS — Z87442 Personal history of urinary calculi: Secondary | ICD-10-CM | POA: Insufficient documentation

## 2013-08-12 DIAGNOSIS — D259 Leiomyoma of uterus, unspecified: Secondary | ICD-10-CM | POA: Insufficient documentation

## 2013-08-12 DIAGNOSIS — Z8679 Personal history of other diseases of the circulatory system: Secondary | ICD-10-CM | POA: Insufficient documentation

## 2013-08-12 DIAGNOSIS — N8000 Endometriosis of the uterus, unspecified: Secondary | ICD-10-CM | POA: Insufficient documentation

## 2013-08-12 DIAGNOSIS — R42 Dizziness and giddiness: Secondary | ICD-10-CM | POA: Insufficient documentation

## 2013-08-12 DIAGNOSIS — N938 Other specified abnormal uterine and vaginal bleeding: Secondary | ICD-10-CM | POA: Insufficient documentation

## 2013-08-12 DIAGNOSIS — F329 Major depressive disorder, single episode, unspecified: Secondary | ICD-10-CM | POA: Insufficient documentation

## 2013-08-12 DIAGNOSIS — F3289 Other specified depressive episodes: Secondary | ICD-10-CM | POA: Insufficient documentation

## 2013-08-12 DIAGNOSIS — N809 Endometriosis, unspecified: Secondary | ICD-10-CM

## 2013-08-12 DIAGNOSIS — G8929 Other chronic pain: Secondary | ICD-10-CM | POA: Insufficient documentation

## 2013-08-12 DIAGNOSIS — Z9089 Acquired absence of other organs: Secondary | ICD-10-CM | POA: Insufficient documentation

## 2013-08-12 DIAGNOSIS — E079 Disorder of thyroid, unspecified: Secondary | ICD-10-CM | POA: Insufficient documentation

## 2013-08-12 DIAGNOSIS — Z8719 Personal history of other diseases of the digestive system: Secondary | ICD-10-CM | POA: Insufficient documentation

## 2013-08-12 DIAGNOSIS — Z862 Personal history of diseases of the blood and blood-forming organs and certain disorders involving the immune mechanism: Secondary | ICD-10-CM | POA: Insufficient documentation

## 2013-08-12 DIAGNOSIS — Z9851 Tubal ligation status: Secondary | ICD-10-CM | POA: Insufficient documentation

## 2013-08-12 DIAGNOSIS — Z79899 Other long term (current) drug therapy: Secondary | ICD-10-CM | POA: Insufficient documentation

## 2013-08-12 DIAGNOSIS — D219 Benign neoplasm of connective and other soft tissue, unspecified: Secondary | ICD-10-CM

## 2013-08-12 LAB — WET PREP, GENITAL
TRICH WET PREP: NONE SEEN
Yeast Wet Prep HPF POC: NONE SEEN

## 2013-08-12 LAB — CBC WITH DIFFERENTIAL/PLATELET
BASOS ABS: 0 10*3/uL (ref 0.0–0.1)
Basophils Relative: 0 % (ref 0–1)
EOS PCT: 1 % (ref 0–5)
Eosinophils Absolute: 0.1 10*3/uL (ref 0.0–0.7)
HEMATOCRIT: 36.8 % (ref 36.0–46.0)
Hemoglobin: 12.6 g/dL (ref 12.0–15.0)
LYMPHS PCT: 23 % (ref 12–46)
Lymphs Abs: 1.6 10*3/uL (ref 0.7–4.0)
MCH: 36.5 pg — ABNORMAL HIGH (ref 26.0–34.0)
MCHC: 34.2 g/dL (ref 30.0–36.0)
MCV: 106.7 fL — ABNORMAL HIGH (ref 78.0–100.0)
Monocytes Absolute: 0.4 10*3/uL (ref 0.1–1.0)
Monocytes Relative: 5 % (ref 3–12)
NEUTROS ABS: 4.9 10*3/uL (ref 1.7–7.7)
Neutrophils Relative %: 71 % (ref 43–77)
PLATELETS: 206 10*3/uL (ref 150–400)
RBC: 3.45 MIL/uL — ABNORMAL LOW (ref 3.87–5.11)
RDW: 13.3 % (ref 11.5–15.5)
WBC: 6.9 10*3/uL (ref 4.0–10.5)

## 2013-08-12 LAB — URINALYSIS, ROUTINE W REFLEX MICROSCOPIC
Bilirubin Urine: NEGATIVE
Glucose, UA: NEGATIVE mg/dL
Ketones, ur: NEGATIVE mg/dL
Leukocytes, UA: NEGATIVE
NITRITE: NEGATIVE
Protein, ur: NEGATIVE mg/dL
SPECIFIC GRAVITY, URINE: 1.021 (ref 1.005–1.030)
UROBILINOGEN UA: 0.2 mg/dL (ref 0.0–1.0)
pH: 6 (ref 5.0–8.0)

## 2013-08-12 LAB — URINE MICROSCOPIC-ADD ON

## 2013-08-12 LAB — COMPREHENSIVE METABOLIC PANEL
ALK PHOS: 97 U/L (ref 39–117)
ALT: 10 U/L (ref 0–35)
AST: 11 U/L (ref 0–37)
Albumin: 3.5 g/dL (ref 3.5–5.2)
BUN: 12 mg/dL (ref 6–23)
CHLORIDE: 103 meq/L (ref 96–112)
CO2: 23 mEq/L (ref 19–32)
Calcium: 8.8 mg/dL (ref 8.4–10.5)
Creatinine, Ser: 0.6 mg/dL (ref 0.50–1.10)
GFR calc non Af Amer: >90 mL/min (ref >90)
Glucose, Bld: 83 mg/dL (ref 70–99)
POTASSIUM: 3.6 meq/L — AB (ref 3.7–5.3)
SODIUM: 139 meq/L (ref 137–147)
Total Protein: 6.3 g/dL (ref 6.0–8.3)

## 2013-08-12 LAB — PREGNANCY, URINE: Preg Test, Ur: NEGATIVE

## 2013-08-12 MED ORDER — PROMETHAZINE HCL 25 MG/ML IJ SOLN
12.5000 mg | Freq: Once | INTRAMUSCULAR | Status: AC
  Administered 2013-08-12: 12.5 mg via INTRAVENOUS
  Filled 2013-08-12 (×2): qty 1

## 2013-08-12 MED ORDER — HYDROMORPHONE HCL PF 1 MG/ML IJ SOLN
1.0000 mg | Freq: Once | INTRAMUSCULAR | Status: AC
  Administered 2013-08-12: 1 mg via INTRAVENOUS
  Filled 2013-08-12: qty 1

## 2013-08-12 MED ORDER — OXYCODONE-ACETAMINOPHEN 5-325 MG PO TABS
1.0000 | ORAL_TABLET | Freq: Once | ORAL | Status: AC
  Administered 2013-08-12: 1 via ORAL
  Filled 2013-08-12: qty 1

## 2013-08-12 MED ORDER — OXYCODONE-ACETAMINOPHEN 5-325 MG PO TABS: 1.0000 | ORAL_TABLET | ORAL | Status: DC | PRN

## 2013-08-12 MED ORDER — SODIUM CHLORIDE 0.9 % IV BOLUS (SEPSIS)
1000.0000 mL | Freq: Once | INTRAVENOUS | Status: AC
  Administered 2013-08-12: 1000 mL via INTRAVENOUS

## 2013-08-12 NOTE — ED Notes (Signed)
Pt c/o vaginal bleeding and abd cramping that started last Wed.  Pt states that she was seen here on 5/6 and had biopsy done and was told that she has "fibroids and a word i can't pronounce".  Pt states that she is going through sanitary pad every couple hours.

## 2013-08-12 NOTE — ED Notes (Signed)
Patient was prescribed megace following her visit to [facility] outpatient and was told that she would need a hysterectomy.

## 2013-08-12 NOTE — ED Provider Notes (Signed)
CSN: [id number]     Arrival date & time 08/12/13  1503 History   First MD Initiated Contact with Patient 08/12/13 1634     Chief Complaint  Patient presents with  . Vaginal Bleeding  . Abdominal Cramping     (Consider location/radiation/quality/duration/timing/severity/associated sxs/prior Treatment) HPI Comments: Patient is 48 year old female who is 2 years s/p onset of menopause who presents to the ED with vaginal bleeding and cramping - she was seen last week for the same, she states now with continued pain and bleeding - she had an endometrial biopsy at [facility] hospital and is scheduled to have a hysterectomy to control symptoms and because of the uterine fibroids.  She reports dizziness with standing.  Patient is a 48 y.o. female presenting with vaginal bleeding and cramps. The history is provided by the patient. No language interpreter was used.  Vaginal Bleeding Quality:  Unable to specify Severity:  Severe Onset quality:  Gradual Duration:  1 week Timing:  Constant Progression:  Worsening Chronicity:  Recurrent Menstrual history:  Postmenopausal Number of pads used:  1 per hour Possible pregnancy: no   Context: spontaneously   Relieved by:  Nothing Worsened by:  Nothing tried Ineffective treatments:  None tried Associated symptoms: abdominal pain and dizziness   Associated symptoms: no dysuria, no fatigue, no fever, no nausea and no vaginal discharge   Abdominal Cramping Associated symptoms: vaginal bleeding   Associated symptoms: no dysuria, no fatigue, no fever, no nausea and no vaginal discharge     Past Medical History  Diagnosis Date  . Collagenous colitis   . WPW (Wolff-Parkinson-White syndrome)   . Kidney stone   . Addison disease   . Thyroid disease   . Clostridium difficile diarrhea   . Anxiety and depression   . Chronic diarrhea   . Chronic back pain   . Drug-seeking behavior   . Irritable bowel syndrome (IBS)   . Anemia   . Kidney stone   .  Pancreatitis    Past Surgical History  Procedure Laterality Date  . Cholecystectomy    . Tubal ligation    . Lithotripsy    . Sphincterotomy    . Cardiac electrophysiology mapping and ablation      for WPW  . Video bronchoscopy  08/31/2011    Procedure: VIDEO BRONCHOSCOPY WITH FLUORO;  Surgeon: Elsie Stain, MD;  Location: [facility];  Service: Cardiopulmonary;  Laterality: N/A;   Family History  Problem Relation Age of Onset  . Prostate cancer Father   . Coronary artery disease Maternal Grandfather    History  Substance Use Topics  . Smoking status: Current Every Day Smoker -- 0.25 packs/day for 30 years    Types: Cigarettes  . Smokeless tobacco: Never Used  . Alcohol Use: Yes     Comment: seldom   OB History   Grav Para Term Preterm Abortions TAB SAB Ect Mult Living   3 3 3       3      Review of Systems  Constitutional: Negative for fever and fatigue.  Gastrointestinal: Positive for abdominal pain. Negative for nausea.  Genitourinary: Positive for vaginal bleeding. Negative for dysuria and vaginal discharge.  Neurological: Positive for dizziness.  All other systems reviewed and are negative.     Allergies  Prednisone; Ambien; Nsaids; and Zofran  Home Medications   Prior to Admission medications   Medication Sig Start Date End Date Taking? Authorizing Provider  budesonide (ENTOCORT EC) 3 MG 24 hr capsule  Take 9 mg by mouth daily.    Yes Historical Provider, MD  butalbital-acetaminophen-caffeine (FIORICET WITH CODEINE) 50-325-40-30 MG per capsule Take 1 capsule by mouth every 4 (four) hours as needed for migraine. 03/13/13  Yes Wendie Agreste, MD  colestipol (COLESTID) 1 G tablet Take 2 g by mouth daily.  12/18/12 12/18/13 Yes Historical Provider, MD  Cyanocobalamin (VITAMIN B 12) 100 MCG LOZG Take 1,000 mcg by mouth daily.    Yes Historical Provider, MD  dicyclomine (BENTYL) 20 MG tablet Take 20 mg by mouth as needed for spasms.  03/30/13 03/30/14 Yes  Historical Provider, MD  diphenoxylate-atropine (LOMOTIL) 2.5-0.025 MG per tablet Take 1 tablet by mouth daily.  03/06/13  Yes Historical Provider, MD  escitalopram (LEXAPRO) 20 MG tablet Take 20 mg by mouth daily.   Yes Historical Provider, MD  Levothyroxine Sodium 50 MCG CAPS Take 50 mcg by mouth daily.    Yes Historical Provider, MD  Loperamide HCl (IMODIUM A-D PO) Take 1-2 tablets by mouth as needed (diarrhea).    Yes Historical Provider, MD  LORazepam (ATIVAN) 2 MG tablet Take 2-4 mg by mouth daily as needed for anxiety.   Yes Historical Provider, MD  megestrol (MEGACE) 20 MG tablet Take 1 tablet (20 mg total) by mouth as directed. Take two tablets (40 mg) three times per day time three days,  then take two tablets (40 mg) two times per day time three days,  then take two tablets (40 mg)once per day 08/08/13  Yes Manya Silvas, CNM  Multiple Vitamin (MULITIVITAMIN WITH MINERALS) TABS Take 1 tablet by mouth daily.    Yes Historical Provider, MD  oxyCODONE-acetaminophen (PERCOCET/ROXICET) 5-325 MG per tablet Take 1-2 tablets by mouth every 4 (four) hours as needed for severe pain. 08/07/13  Yes Hannah Muthersbaugh, PA-C  potassium chloride SA (K-DUR,KLOR-CON) 20 MEQ tablet Take 20 mEq by mouth daily.    Yes Historical Provider, MD  promethazine (PHENERGAN) 25 MG tablet Take 1 tablet (25 mg total) by mouth every 6 (six) hours as needed for nausea. 05/09/13  Yes Charles B. Karle Starch, MD  Vitamin D, Ergocalciferol, (DRISDOL) 50000 UNITS CAPS capsule Take 50,000 Units by mouth every 7 (seven) days. Take on Mondays   Yes Historical Provider, MD   BP 129/75  Pulse 63  Temp(Src) 98.3 F (36.8 C) (Oral)  Resp 20  Wt 113 lb (51.256 kg)  SpO2 100%  LMP 10/17/2011 Physical Exam  Nursing note and vitals reviewed. Constitutional: She is oriented to person, place, and time. She appears well-developed and well-nourished. She appears distressed.  HENT:  Head: Normocephalic and atraumatic.  Right Ear: External  ear normal.  Left Ear: External ear normal.  Nose: Nose normal.  Mouth/Throat: Oropharynx is clear and moist. No oropharyngeal exudate.  Eyes: Conjunctivae are normal. Pupils are equal, round, and reactive to light. No scleral icterus.  Neck: Normal range of motion. Neck supple.  Cardiovascular: Normal rate, regular rhythm and normal heart sounds.  Exam reveals no gallop and no friction rub.   No murmur heard. Pulmonary/Chest: Breath sounds normal. No respiratory distress. She has no wheezes. She has no rales. She exhibits no tenderness.  Abdominal: Soft. Bowel sounds are normal. She exhibits no distension and no mass. There is tenderness. There is no rebound and no guarding.    Genitourinary: There is no rash, tenderness or lesion on the right labia. There is no rash, tenderness or lesion on the left labia. Uterus is enlarged and tender. Cervix exhibits no motion  tenderness, no discharge and no friability. Right adnexum displays no mass, no tenderness and no fullness. Left adnexum displays no mass, no tenderness and no fullness. There is bleeding around the vagina. No vaginal discharge found.  Scant dark red blood in vaginal vault  Musculoskeletal: Normal range of motion. She exhibits no edema and no tenderness.  Lymphadenopathy:    She has no cervical adenopathy.  Neurological: She is alert and oriented to person, place, and time. She exhibits normal muscle tone. Coordination normal.  Skin: Skin is warm and dry. No rash noted. No erythema. No pallor.  Psychiatric: She has a normal mood and affect. Her behavior is normal. Judgment and thought content normal.    ED Course  Procedures (including critical care time) Labs Review Labs Reviewed  WET PREP, GENITAL - Abnormal; Notable for the following:    Clue Cells Wet Prep HPF POC MANY (*)    WBC, Wet Prep HPF POC FEW (*)    All other components within normal limits  CBC WITH DIFFERENTIAL - Abnormal; Notable for the following:    RBC 3.45  (*)    MCV 106.7 (*)    MCH 36.5 (*)    All other components within normal limits  COMPREHENSIVE METABOLIC PANEL - Abnormal; Notable for the following:    Potassium 3.6 (*)    Total Bilirubin <0.2 (*)    All other components within normal limits  URINALYSIS, ROUTINE W REFLEX MICROSCOPIC - Abnormal; Notable for the following:    Hgb urine dipstick SMALL (*)    All other components within normal limits  URINE MICROSCOPIC-ADD ON - Abnormal; Notable for the following:    Bacteria, UA FEW (*)    All other components within normal limits  GC/CHLAMYDIA PROBE AMP  RPR  HIV ANTIBODY (ROUTINE TESTING)    Imaging Review No results found.   EKG Interpretation None      Results for orders placed during the hospital encounter of 08/12/13  WET PREP, GENITAL      Result Value Ref Range   Yeast Wet Prep HPF POC NONE SEEN  NONE SEEN   Trich, Wet Prep NONE SEEN  NONE SEEN   Clue Cells Wet Prep HPF POC MANY (*) NONE SEEN   WBC, Wet Prep HPF POC FEW (*) NONE SEEN  CBC WITH DIFFERENTIAL      Result Value Ref Range   WBC 6.9  4.0 - 10.5 K/uL   RBC 3.45 (*) 3.87 - 5.11 MIL/uL   Hemoglobin 12.6  12.0 - 15.0 g/dL   HCT 13.2  44.0 - 10.2 %   MCV 106.7 (*) 78.0 - 100.0 fL   MCH 36.5 (*) 26.0 - 34.0 pg   MCHC 34.2  30.0 - 36.0 g/dL   RDW 72.5  36.6 - 44.0 %   Platelets 206  150 - 400 K/uL   Neutrophils Relative % 71  43 - 77 %   Neutro Abs 4.9  1.7 - 7.7 K/uL   Lymphocytes Relative 23  12 - 46 %   Lymphs Abs 1.6  0.7 - 4.0 K/uL   Monocytes Relative 5  3 - 12 %   Monocytes Absolute 0.4  0.1 - 1.0 K/uL   Eosinophils Relative 1  0 - 5 %   Eosinophils Absolute 0.1  0.0 - 0.7 K/uL   Basophils Relative 0  0 - 1 %   Basophils Absolute 0.0  0.0 - 0.1 K/uL  COMPREHENSIVE METABOLIC PANEL      Result  Value Ref Range   Sodium 139  137 - 147 mEq/L   Potassium 3.6 (*) 3.7 - 5.3 mEq/L   Chloride 103  96 - 112 mEq/L   CO2 23  19 - 32 mEq/L   Glucose, Bld 83  70 - 99 mg/dL   BUN 12  6 - 23 mg/dL    Creatinine, Ser 0.60  0.50 - 1.10 mg/dL   Calcium 8.8  8.4 - 10.5 mg/dL   Total Protein 6.3  6.0 - 8.3 g/dL   Albumin 3.5  3.5 - 5.2 g/dL   AST 11  0 - 37 U/L   ALT 10  0 - 35 U/L   Alkaline Phosphatase 97  39 - 117 U/L   Total Bilirubin <0.2 (*) 0.3 - 1.2 mg/dL   GFR calc non Af Amer >90  >90 mL/min   GFR calc Af Amer >90  >90 mL/min  URINALYSIS, ROUTINE W REFLEX MICROSCOPIC      Result Value Ref Range   Color, Urine YELLOW  YELLOW   APPearance CLEAR  CLEAR   Specific Gravity, Urine 1.021  1.005 - 1.030   pH 6.0  5.0 - 8.0   Glucose, UA NEGATIVE  NEGATIVE mg/dL   Hgb urine dipstick SMALL (*) NEGATIVE   Bilirubin Urine NEGATIVE  NEGATIVE   Ketones, ur NEGATIVE  NEGATIVE mg/dL   Protein, ur NEGATIVE  NEGATIVE mg/dL   Urobilinogen, UA 0.2  0.0 - 1.0 mg/dL   Nitrite NEGATIVE  NEGATIVE   Leukocytes, UA NEGATIVE  NEGATIVE  URINE MICROSCOPIC-ADD ON      Result Value Ref Range   Squamous Epithelial / LPF RARE  RARE   WBC, UA 0-2  <3 WBC/hpf   RBC / HPF 0-2  <3 RBC/hpf   Bacteria, UA FEW (*) RARE   Urine-Other MUCOUS PRESENT     Dg Cervical Spine Complete  07/15/2013   CLINICAL DATA:  Right shoulder pain. Right neck and back pain. No known injury.  EXAM: CERVICAL SPINE  4+ VIEWS  COMPARISON:  Cervical spine MR dated 07/15/2013.  FINDINGS: Normal appearing bones and soft tissues.  IMPRESSION: Normal examination.   Electronically Signed   By: Enrique Sack M.D.   On: 07/15/2013 13:54   Dg Thoracic Spine W/swimmers  07/15/2013   CLINICAL DATA:  Right neck, shoulder and back pain  EXAM: THORACIC SPINE - 2 VIEW + SWIMMERS  COMPARISON:  Chest radiograph 10/13/2012  FINDINGS: Twelve pairs of ribs.  Vertebral body heights maintained without fracture or subluxation.  Minimal endplate spur formation and disc space narrowing and mid thoracic spine.  No bone destruction.  Visualized posterior ribs unremarkable.  Osseous mineralization grossly normal for technique.  IMPRESSION: Minimal degenerative disc  disease changes mid thoracic spine.  No acute abnormalities.   Electronically Signed   By: Lavonia Dana M.D.   On: 07/15/2013 13:39   Dg Lumbar Spine Complete  07/15/2013   CLINICAL DATA:  Right-sided back pain.  EXAM: LUMBAR SPINE - COMPLETE 4+ VIEW  COMPARISON:  Lumbar MRI dated 07/15/2013  FINDINGS: There is no disc space narrowing, subluxation, spondylolisthesis, or bone destruction. There is slight bilateral facet arthritis at L4-5.  IMPRESSION: Slight facet arthritis at L4-5.   Electronically Signed   By: Rozetta Nunnery M.D.   On: 07/15/2013 13:53   Dg Shoulder Right  07/15/2013   CLINICAL DATA:  Right shoulder pain.  No known injury.  EXAM: RIGHT SHOULDER - 2+ VIEW  COMPARISON:  01/31/2011.  FINDINGS:  Interval inferior glenoid hyperostosis and spur formation. Otherwise, normal appearing bones and soft tissues.  IMPRESSION: Interval inferior glenoid hyperostosis and spur formation.   Electronically Signed   By: Enrique Sack M.D.   On: 07/15/2013 13:55   Mr Cervical Spine Wo Contrast  07/15/2013   CLINICAL DATA:  Neck pain and arm pain.  Left leg pain.  EXAM: MRI CERVICAL SPINE WITHOUT CONTRAST  TECHNIQUE: Multiplanar, multisequence MR imaging was performed. No intravenous contrast was administered.  COMPARISON:  12/30/2010  FINDINGS: The craniocervical junction appears unremarkable. No significant abnormal spinal cord signal is observed. No vertebral subluxation is observed.  No significant vertebral marrow edema is identified. Additional findings at individual levels are as follows:  C2-3:  Unremarkable.  C3-4:  Unremarkable.  C4-5:  Unremarkable.  C5-6: No impingement. Subtle disc bulge. 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  C6-7: No definite impingement. Disc bulge with small right foraminal disc protrusion. There appears to be a an epidural venous structure which exits into the right neural foramen as well.  C7-T1:  Unremarkable.  IMPRESSION: 1. No  specific impingement in the cervical spine to explain the patient's symptoms.   Electronically Signed   By: Sherryl Barters M.D.   On: 07/15/2013 09:43   Mr Lumbar Spine Wo Contrast  07/15/2013   CLINICAL DATA:  Low back and left greater than right leg pain.  EXAM: MRI LUMBAR SPINE WITHOUT CONTRAST  TECHNIQUE: Multiplanar, multisequence MR imaging was performed. No intravenous contrast was administered.  COMPARISON:  None.  FINDINGS: Normal alignment of the lumbar vertebral bodies. They demonstrate normal marrow signal. There is a hemangioma noted in the T11 vertebral body. The last full intervertebral discs bases labeled L5-S1 and the conus medullaris terminates at L1. The facets are normally aligned. No pars defects.  No significant paraspinal or retroperitoneal findings. The common bile duct is dilated but this is a stable finding since a CT scan of 2013.  L1-2:  No significant findings.  L2-3:  Mild annular bulge but no spinal or foraminal stenosis.  L3-4: Mild hypertrophic facet changes but no focal disc protrusions, spinal or foraminal stenosis.  L4-5: Mild diffuse annular bulge, mild facet disease and ligamentum flavum thickening but no significant spinal or lateral recess stenosis. There is a shallow broad-based foraminal, extra foraminal and far lateral disc protrusion without direct neural compression but possible irritation of the extra foraminal L4 nerve root.  L5-S1: Mild disc desiccation. There is a small annular rent but no associated disc protrusion. No spinal, lateral recess or foraminal stenosis.  IMPRESSION: Shallow broad-based foraminal, extra foraminal and far lateral disc protrusion at L4-5 potentially irritating the left L4 nerve root.  Annular rent at L5-S1 but no associated disc protrusion and no spinal or foraminal stenosis.  Mild facet disease in the lower lumbar spine but no pars defects.   Electronically Signed   By: Kalman Jewels M.D.   On: 07/15/2013 10:01   US Transvaginal  Non-ob  08/07/2013   CLINICAL DATA:  Postmenopausal vaginal bleeding.  EXAM: TRANSABDOMINAL AND TRANSVAGINAL ULTRASOUND OF PELVIS  TECHNIQUE: Both transabdominal and transvaginal ultrasound examinations of the pelvis were performed. Transabdominal technique was performed for global imaging of the pelvis including uterus, ovaries, adnexal regions, and pelvic cul-de-sac. It was necessary to proceed with endovaginal exam following the transabdominal exam to visualize the endometrium and adnexal structures.  COMPARISON:  US ABDOMEN COMPLETE dated 10/13/2012; US RENAL dated 05/25/2012  FINDINGS: Uterus  Measurements: 99 mm x 48 mm x 53 mm. Heterogeneous myometrial echotexture suggesting adenomyosis. Echogenic 14 mm x 16 mm x 10 mm right fundal fibroid.  Endometrium  Thickness: 6.6 mm, normal.  No focal abnormality visualized.  Right ovary  Measurements: 24 mm x 18 mm x 20 mm. Normal appearance/no adnexal mass.  Left ovary  Measurements: 28 mm x 21 mm x 18 mm. Normal appearance/no adnexal mass.  Other findings  No free fluid.  IMPRESSION: 1. Normal endometrium. 2. Single right fundal fibroid.   Electronically Signed   By: Dereck Ligas M.D.   On: 08/07/2013 20:17   US Pelvis Complete  08/07/2013   CLINICAL DATA:  Postmenopausal vaginal bleeding.  EXAM: TRANSABDOMINAL AND TRANSVAGINAL ULTRASOUND OF PELVIS  TECHNIQUE: Both transabdominal and transvaginal ultrasound examinations of the pelvis were performed. Transabdominal technique was performed for global imaging of the pelvis including uterus, ovaries, adnexal regions, and pelvic cul-de-sac. It was necessary to proceed with endovaginal exam following the transabdominal exam to visualize the endometrium and adnexal structures.  COMPARISON:  US ABDOMEN COMPLETE dated 10/13/2012; US RENAL dated 05/25/2012  FINDINGS: Uterus  Measurements: 99 mm x 48 mm x 53 mm. Heterogeneous myometrial echotexture suggesting adenomyosis. Echogenic 14 mm x 16 mm x 10 mm right fundal fibroid.   Endometrium  Thickness: 6.6 mm, normal.  No focal abnormality visualized.  Right ovary  Measurements: 24 mm x 18 mm x 20 mm. Normal appearance/no adnexal mass.  Left ovary  Measurements: 28 mm x 21 mm x 18 mm. Normal appearance/no adnexal mass.  Other findings  No free fluid.  IMPRESSION: 1. Normal endometrium. 2. Single right fundal fibroid.   Electronically Signed   By: Dereck Ligas M.D.   On: 08/07/2013 20:17   Ct Abdomen Pelvis W Contrast  08/07/2013   CLINICAL DATA:  Pelvic pain and cramping, excessive vaginal bleeding, 2 years post menopause, past history collagenous colitis, pancreatitis, irritable bowel syndrome, kidney stones  EXAM: CT ABDOMEN AND PELVIS WITH CONTRAST  TECHNIQUE: Multidetector CT imaging of the abdomen and pelvis was performed using the standard protocol following bolus administration of intravenous contrast. Sagittal and coronal MPR images reconstructed from axial data set.  CONTRAST:  163mL OMNIPAQUE IOHEXOL 300 MG/ML  SOLN  COMPARISON:  03/02/2012 ; correlation pelvic ultrasound 08/07/2013  FINDINGS: Lung bases clear.  Gallbladder surgically absent.  Minimal central intrahepatic biliary dilatation consistent with cholecystectomy, unchanged.  Liver, spleen, pancreas, and adrenal glands normal appearance.  Symmetric nephrograms with BILATERAL nonobstructing renal calculi.  Stomach decompressed, unable to accurately assess gastric wall thickness.  Prior tubal ligation with small right-side uterine leiomyoma 17 mm diameter.  Minimal fluid within endometrial canal.  Colon is under distended and unopacified, suboptimally assessed.  Bowel loops otherwise unremarkable.  No additional mass, adenopathy, free fluid, or free air.  Normal appendix.  Unremarkable bladder and ureters.  No acute osseous findings.  IMPRESSION: Stable intrahepatic biliary dilatation.  BILATERAL nonobstructing renal calculi.  Minimal fluid within endometrial canal.  Small uterine leiomyoma 17 mm diameter.  No other  definite intra-abdominal or intrapelvic abnormalities.   Electronically Signed   By: Lavonia Dana M.D.   On: 08/07/2013 21:55    Medications  oxyCODONE-acetaminophen (PERCOCET/ROXICET) 5-325 MG per tablet 1 tablet (not administered)  sodium chloride 0.9 % bolus 1,000 mL (0 mLs Intravenous Stopped 08/12/13 1959)  HYDROmorphone (DILAUDID) injection 1 mg (1 mg Intravenous Given 08/12/13 1752)  promethazine (PHENERGAN) injection 12.5 mg (12.5 mg Intravenous Given 08/12/13 1845)  MDM   DUB Uterine Fibroids  Patient here with uterine bleeding and dizziness, hemodynamically stable, HGB normal, no bleeding currently based on exam - does not feel that this is her colitis pain - plan to discharge home with pain medication and GYN follow up   [place]. Joelyn Oms, PA-C 08/12/13 2016

## 2013-08-12 NOTE — Discharge Instructions (Signed)
Abnormal Uterine Bleeding Abnormal uterine bleeding can affect women at various stages in life, including teenagers, women in their reproductive years, pregnant women, and women who have reached menopause. Several kinds of uterine bleeding are considered abnormal, including:  Bleeding or spotting between periods.   Bleeding after sexual intercourse.   Bleeding that is heavier or more than normal.   Periods that last longer than usual.  Bleeding after menopause.  Many cases of abnormal uterine bleeding are minor and simple to treat, while others are more serious. Any type of abnormal bleeding should be evaluated by your health care provider. Treatment will depend on the cause of the bleeding. HOME CARE INSTRUCTIONS Monitor your condition for any changes. The following actions may help to alleviate any discomfort you are experiencing:  Avoid the use of tampons and douches as directed by your health care provider.  Change your pads frequently. You should get regular pelvic exams and Pap tests. Keep all follow-up appointments for diagnostic tests as directed by your health care provider.  SEEK MEDICAL CARE IF:   Your bleeding lasts more than 1 week.   You feel dizzy at times.  SEEK IMMEDIATE MEDICAL CARE IF:   You pass out.   You are changing pads every 15 to 30 minutes.   You have abdominal pain.  You have a fever.   You become sweaty or weak.   You are passing large blood clots from the vagina.   You start to feel nauseous and vomit. MAKE SURE YOU:   Understand these instructions.  Will watch your condition.  Will get help right away if you are not doing well or get worse. Document Released: 03/21/2005 Document Revised: 11/21/2012 Document Reviewed: 10/18/2012 [facility] Patient Information 2014 [place], [place].  Endometrial Biopsy Endometrial biopsy is a procedure in which a tissue sample is taken from inside the uterus. The tissue sample is then looked at  under a microscope to see if the tissue is normal or abnormal. The endometrium is the lining of the uterus. This procedure helps determine where you are in your menstrual cycle and how hormone levels are affecting the lining of the uterus. This procedure may also be used to evaluate uterine bleeding or to diagnose endometrial cancer, tuberculosis, polyps, or inflammatory conditions.  LET [facility] CARE PROVIDER KNOW ABOUT:  Any allergies you have.  All medicines you are taking, including vitamins, herbs, eye drops, creams, and over-the-counter medicines.  Previous problems you or members of your family have had with the use of anesthetics.  Any blood disorders you have.  Previous surgeries you have had.  Medical conditions you have.  Possibility of pregnancy. RISKS AND COMPLICATIONS Generally, this is a safe procedure. However, as with any procedure, complications can occur. Possible complications include:  Bleeding.  Pelvic infection.  Puncture of the uterine wall with the biopsy device (rare). BEFORE THE PROCEDURE   Keep a record of your menstrual cycles as directed by your health care provider. You may need to schedule your procedure for a specific time in your cycle.  You may want to bring a sanitary pad to wear home after the procedure.  Arrange for someone to drive you home after the procedure if you will be given a medicine to help you relax (sedative). PROCEDURE   You may be given a sedative to relax you.  You will lie on an exam table with your feet and legs supported as in a pelvic exam.  Your health care provider will insert an  instrument (speculum) into your vagina to see your cervix.  Your cervix will be cleansed with an antiseptic solution. A medicine (local anesthetic) will be used to numb the cervix.  A forceps instrument (tenaculum) will be used to hold your cervix steady for the biopsy.  A thin, rodlike instrument (uterine sound) will be inserted  through your cervix to determine the length of your uterus and the location where the biopsy sample will be removed.  A thin, flexible tube (catheter) will be inserted through your cervix and into the uterus. The catheter is used to collect the biopsy sample from your endometrial tissue.  The catheter and speculum will then be removed, and the tissue sample will be sent to a lab for examination. AFTER THE PROCEDURE  You will rest in a recovery area until you are ready to go home.  You may have mild cramping and a small amount of vaginal bleeding for a few days after the procedure. This is normal.  Make sure you find out how to get your test results. Document Released: 07/22/2004 Document Revised: 11/21/2012 Document Reviewed: 09/05/2012 [facility] Patient Information 2014 [place], [place].  Uterine Fibroid A uterine fibroid is a growth (tumor) that occurs in your uterus. This type of tumor is not cancerous and does not spread out of the uterus. You can have one or many fibroids. Fibroids can vary in size, weight, and where they grow in the uterus. Some can become quite large. Most fibroids do not require medical treatment, but some can cause pain or heavy bleeding during and between periods. CAUSES  A fibroid is the result of a single uterine cell that keeps growing (unregulated), which is different than most cells in the human body. Most cells have a control mechanism that keeps them from reproducing without control.  SIGNS AND SYMPTOMS   Bleeding.  Pelvic pain and pressure.  Bladder problems due to the size of the fibroid.  Infertility and miscarriages depending on the size and location of the fibroid. DIAGNOSIS  Uterine fibroids are diagnosed through a physical exam. Your health care provider may feel the lumpy tumors during a pelvic exam. Ultrasonography may be done to get information regarding size, location, and number of tumors.  TREATMENT   Your health care provider may recommend  watchful waiting. This involves getting the fibroid checked by your health care provider to see if it grows or shrinks.   Hormone treatment or an intrauterine device (IUD) may be prescribed.   Surgery may be needed to remove the fibroids (myomectomy) or the uterus (hysterectomy). This depends on your situation. When fibroids interfere with fertility and a woman wants to become pregnant, a health care provider may recommend having the fibroids removed.  [place] care depends on how you were treated. In general:   Keep all follow-up appointments with your health care provider.   Only take over-the-counter or prescription medicines as directed by your health care provider. If you were prescribed a hormone treatment, take the hormone medicines exactly as directed. Do not take aspirin. It can cause bleeding.   Talk to your health care provider about taking iron pills.  If your periods are troublesome but not so heavy, lie down with your feet raised slightly above your heart. Place cold packs on your lower abdomen.   If your periods are heavy, write down the number of pads or tampons you use per month. Bring this information to your health care provider.   Include green vegetables in  your diet.  SEEK IMMEDIATE MEDICAL CARE IF:  You have pelvic pain or cramps not controlled with medicines.   You have a sudden increase in pelvic pain.   You have an increase in bleeding between and during periods.   You have excessive periods and soak tampons or pads in a half hour or less.  You feel lightheaded or have fainting episodes. Document Released: 03/18/2000 Document Revised: 01/09/2013 Document Reviewed: 10/18/2012 [facility] Patient Information 2014 [place], [place].

## 2013-08-13 LAB — RPR

## 2013-08-13 LAB — GC/CHLAMYDIA PROBE AMP
CT Probe RNA: NEGATIVE
GC PROBE AMP APTIMA: NEGATIVE

## 2013-08-13 LAB — HIV ANTIBODY (ROUTINE TESTING W REFLEX): HIV 1&2 Ab, 4th Generation: NONREACTIVE

## 2013-08-14 NOTE — ED Provider Notes (Signed)
Medical screening examination/treatment/procedure(s) were performed by non-physician practitioner and as supervising physician I was immediately available for consultation/collaboration.   EKG Interpretation None       Babette Relic, MD 08/14/13 2128

## 2013-08-15 ENCOUNTER — Encounter: Payer: Self-pay | Admitting: Obstetrics & Gynecology

## 2013-08-15 ENCOUNTER — Ambulatory Visit (INDEPENDENT_AMBULATORY_CARE_PROVIDER_SITE_OTHER): Payer: BC Managed Care – PPO | Admitting: Obstetrics & Gynecology

## 2013-08-15 VITALS — BP 128/89 | HR 107 | Resp 16 | Ht 64.0 in | Wt 115.0 lb

## 2013-08-15 DIAGNOSIS — N950 Postmenopausal bleeding: Secondary | ICD-10-CM

## 2013-08-15 MED ORDER — OXYCODONE-ACETAMINOPHEN 5-325 MG PO TABS: 1.0000 | ORAL_TABLET | Freq: Four times a day (QID) | ORAL | Status: DC | PRN

## 2013-08-15 NOTE — Addendum Note (Signed)
Addended by: Emily Filbert on: 08/15/2013 03:43 PM   Modules accepted: Orders

## 2013-08-15 NOTE — Progress Notes (Addendum)
   Subjective:    Patient ID: Dawn Frost, female    DOB: 05/06/1965, 48 y.o.   MRN: [id number]  HPI 48 yo MW G3P3 (26,20, 5) here today to discuss her PMB. Her pap and EMBX were both normal. U/s showed 1 cm fibroid, possible adenomyosis. Her endometrium was 6.6 mm. She also has cramps and lower back pain associated with the new onset PMB. The megace has slowed the bleeding down a little.  She tells me that she cannot take NSAIDS because of her colitis and is requesting some more percocet   Review of Systems Her husband is now in [place] where she is planning to move the first of July.    Objective:   Physical Exam        Assessment & Plan:  PMB with negative wu- offered watchful waiting. D&c  With option for endometrial ablation.Hysterectomy also an option. I would also consider a Mirena for her Percocet #40 but no refills

## 2013-08-15 NOTE — Patient Instructions (Signed)

## 2013-08-20 ENCOUNTER — Telehealth: Payer: Self-pay | Admitting: *Deleted

## 2013-08-20 NOTE — Telephone Encounter (Signed)
Pt called wanting to schedule her hysterectomy with Dr Hulan Fray.  Dr Hulan Fray sent [place] a message to schedule surgery and let pt know of the date and time.

## 2013-08-25 ENCOUNTER — Emergency Department (HOSPITAL_COMMUNITY)
Admission: EM | Admit: 2013-08-25 | Discharge: 2013-08-25 | Disposition: A | Discharge status: Home / Self Care | Payer: BC Managed Care – PPO | Attending: Emergency Medicine | Admitting: Emergency Medicine

## 2013-08-25 ENCOUNTER — Encounter (HOSPITAL_COMMUNITY): Payer: Self-pay | Admitting: Emergency Medicine

## 2013-08-25 DIAGNOSIS — Z8679 Personal history of other diseases of the circulatory system: Secondary | ICD-10-CM | POA: Insufficient documentation

## 2013-08-25 DIAGNOSIS — Z8619 Personal history of other infectious and parasitic diseases: Secondary | ICD-10-CM | POA: Insufficient documentation

## 2013-08-25 DIAGNOSIS — Z79899 Other long term (current) drug therapy: Secondary | ICD-10-CM | POA: Insufficient documentation

## 2013-08-25 DIAGNOSIS — Z9851 Tubal ligation status: Secondary | ICD-10-CM | POA: Insufficient documentation

## 2013-08-25 DIAGNOSIS — E079 Disorder of thyroid, unspecified: Secondary | ICD-10-CM | POA: Insufficient documentation

## 2013-08-25 DIAGNOSIS — G8929 Other chronic pain: Secondary | ICD-10-CM | POA: Insufficient documentation

## 2013-08-25 DIAGNOSIS — F329 Major depressive disorder, single episode, unspecified: Secondary | ICD-10-CM | POA: Insufficient documentation

## 2013-08-25 DIAGNOSIS — Z87442 Personal history of urinary calculi: Secondary | ICD-10-CM | POA: Insufficient documentation

## 2013-08-25 DIAGNOSIS — R109 Unspecified abdominal pain: Secondary | ICD-10-CM | POA: Insufficient documentation

## 2013-08-25 DIAGNOSIS — N939 Abnormal uterine and vaginal bleeding, unspecified: Secondary | ICD-10-CM

## 2013-08-25 DIAGNOSIS — Z8719 Personal history of other diseases of the digestive system: Secondary | ICD-10-CM | POA: Insufficient documentation

## 2013-08-25 DIAGNOSIS — N898 Other specified noninflammatory disorders of vagina: Secondary | ICD-10-CM | POA: Insufficient documentation

## 2013-08-25 DIAGNOSIS — Z3202 Encounter for pregnancy test, result negative: Secondary | ICD-10-CM | POA: Insufficient documentation

## 2013-08-25 DIAGNOSIS — Z862 Personal history of diseases of the blood and blood-forming organs and certain disorders involving the immune mechanism: Secondary | ICD-10-CM | POA: Insufficient documentation

## 2013-08-25 DIAGNOSIS — R42 Dizziness and giddiness: Secondary | ICD-10-CM | POA: Insufficient documentation

## 2013-08-25 DIAGNOSIS — F17200 Nicotine dependence, unspecified, uncomplicated: Secondary | ICD-10-CM | POA: Insufficient documentation

## 2013-08-25 DIAGNOSIS — F411 Generalized anxiety disorder: Secondary | ICD-10-CM | POA: Insufficient documentation

## 2013-08-25 DIAGNOSIS — F3289 Other specified depressive episodes: Secondary | ICD-10-CM | POA: Insufficient documentation

## 2013-08-25 LAB — I-STAT CHEM 8, ED
BUN: 19 mg/dL (ref 6–23)
Calcium, Ion: 1.13 mmol/L (ref 1.12–1.23)
Chloride: 107 mEq/L (ref 96–112)
Creatinine, Ser: 1 mg/dL (ref 0.50–1.10)
Glucose, Bld: 106 mg/dL — ABNORMAL HIGH (ref 70–99)
HCT: 40 % (ref 36.0–46.0)
Hemoglobin: 13.6 g/dL (ref 12.0–15.0)
POTASSIUM: 3.8 meq/L (ref 3.7–5.3)
Sodium: 139 mEq/L (ref 137–147)
TCO2: 21 mmol/L (ref 0–100)

## 2013-08-25 LAB — CBC
HCT: 37 % (ref 36.0–46.0)
HEMOGLOBIN: 12.5 g/dL (ref 12.0–15.0)
MCH: 37 pg — AB (ref 26.0–34.0)
MCHC: 33.8 g/dL (ref 30.0–36.0)
MCV: 109.5 fL — ABNORMAL HIGH (ref 78.0–100.0)
Platelets: 254 10*3/uL (ref 150–400)
RBC: 3.38 MIL/uL — AB (ref 3.87–5.11)
RDW: 14.4 % (ref 11.5–15.5)
WBC: 7.6 10*3/uL (ref 4.0–10.5)

## 2013-08-25 LAB — WET PREP, GENITAL
CLUE CELLS WET PREP: NONE SEEN
Trich, Wet Prep: NONE SEEN
WBC WET PREP: NONE SEEN
YEAST WET PREP: NONE SEEN

## 2013-08-25 LAB — POC URINE PREG, ED: Preg Test, Ur: NEGATIVE

## 2013-08-25 MED ORDER — OXYCODONE-ACETAMINOPHEN 5-325 MG PO TABS: 1.0000 | ORAL_TABLET | Freq: Four times a day (QID) | ORAL | Status: DC | PRN

## 2013-08-25 MED ORDER — MEGESTROL ACETATE 20 MG PO TABS: ORAL_TABLET | ORAL | Status: DC

## 2013-08-25 MED ORDER — HYDROMORPHONE HCL PF 1 MG/ML IJ SOLN
1.0000 mg | INTRAMUSCULAR | Status: AC
  Administered 2013-08-25: 1 mg via INTRAVENOUS
  Filled 2013-08-25: qty 1

## 2013-08-25 MED ORDER — PROMETHAZINE HCL 25 MG/ML IJ SOLN
12.5000 mg | Freq: Once | INTRAMUSCULAR | Status: AC
  Administered 2013-08-25: 12.5 mg via INTRAVENOUS
  Filled 2013-08-25: qty 1

## 2013-08-25 MED ORDER — HYDROMORPHONE HCL PF 1 MG/ML IJ SOLN
1.0000 mg | Freq: Once | INTRAMUSCULAR | Status: AC
  Administered 2013-08-25: 1 mg via INTRAVENOUS
  Filled 2013-08-25: qty 1

## 2013-08-25 MED ORDER — SODIUM CHLORIDE 0.9 % IV BOLUS (SEPSIS)
1000.0000 mL | INTRAVENOUS | Status: AC
  Administered 2013-08-25: 1000 mL via INTRAVENOUS

## 2013-08-25 NOTE — ED Provider Notes (Signed)
CSN: [id number]     Arrival date & time 08/25/13  1818 History   First MD Initiated Contact with Patient 08/25/13 1833     Chief Complaint  Patient presents with  . Vaginal Bleeding  . Abdominal Pain     (Consider location/radiation/quality/duration/timing/severity/associated sxs/prior Treatment) Patient is a 48 y.o. female presenting with vaginal bleeding and abdominal pain. The history is provided by the patient.  Vaginal Bleeding Quality:  Clots and dark red Severity:  Moderate Onset quality:  Gradual Timing:  Constant Progression:  Worsening Chronicity:  New Possible pregnancy: no   Context: spontaneously   Relieved by:  Nothing Worsened by:  Nothing tried Ineffective treatments: megace. Associated symptoms: abdominal pain and dizziness   Associated symptoms: no back pain, no dysuria, no fatigue, no fever and no nausea   Abdominal Pain Associated symptoms: vaginal bleeding   Associated symptoms: no chest pain, no cough, no diarrhea, no dysuria, no fatigue, no fever, no hematuria, no nausea, no shortness of breath and no vomiting     Past Medical History  Diagnosis Date  . Collagenous colitis   . WPW (Wolff-Parkinson-White syndrome)   . Kidney stone   . Addison disease   . Thyroid disease   . Clostridium difficile diarrhea   . Anxiety and depression   . Chronic diarrhea   . Chronic back pain   . Drug-seeking behavior   . Irritable bowel syndrome (IBS)   . Anemia   . Kidney stone   . Pancreatitis    Past Surgical History  Procedure Laterality Date  . Cholecystectomy    . Tubal ligation    . Lithotripsy    . Sphincterotomy    . Cardiac electrophysiology mapping and ablation      for WPW  . Video bronchoscopy  08/31/2011    Procedure: VIDEO BRONCHOSCOPY WITH FLUORO;  Surgeon: Elsie Stain, MD;  Location: [facility];  Service: Cardiopulmonary;  Laterality: N/A;   Family History  Problem Relation Age of Onset  . Prostate cancer Father   . Coronary  artery disease Maternal Grandfather    History  Substance Use Topics  . Smoking status: Current Every Day Smoker -- 0.25 packs/day for 30 years    Types: Cigarettes  . Smokeless tobacco: Never Used  . Alcohol Use: Yes     Comment: seldom   OB History   Grav Para Term Preterm Abortions TAB SAB Ect Mult Living   3 3 3       3      Review of Systems  Constitutional: Negative for fever and fatigue.  HENT: Negative for congestion and drooling.   Eyes: Negative for pain.  Respiratory: Negative for cough and shortness of breath.   Cardiovascular: Negative for chest pain.  Gastrointestinal: Positive for abdominal pain. Negative for nausea, vomiting and diarrhea.  Genitourinary: Positive for vaginal bleeding. Negative for dysuria and hematuria.  Musculoskeletal: Negative for back pain, gait problem and neck pain.  Skin: Negative for color change.  Neurological: Positive for dizziness. Negative for headaches.  Hematological: Negative for adenopathy.  Psychiatric/Behavioral: Negative for behavioral problems.  All other systems reviewed and are negative.     Allergies  Prednisone; Ambien; Nsaids; and Zofran  Home Medications   Prior to Admission medications   Medication Sig Start Date End Date Taking? Authorizing Provider  budesonide (ENTOCORT EC) 3 MG 24 hr capsule Take 9 mg by mouth daily.    Yes Historical Provider, MD  butalbital-acetaminophen-caffeine (FIORICET WITH CODEINE) 50-325-40-30 MG per capsule  Take 1 capsule by mouth every 4 (four) hours as needed for migraine. 03/13/13  Yes Wendie Agreste, MD  colestipol (COLESTID) 1 G tablet Take 2 g by mouth daily.  12/18/12 12/18/13 Yes Historical Provider, MD  Cyanocobalamin (VITAMIN B 12) 100 MCG LOZG Take 1,000 mcg by mouth daily.    Yes Historical Provider, MD  dicyclomine (BENTYL) 20 MG tablet Take 20 mg by mouth as needed for spasms.  03/30/13 03/30/14 Yes Historical Provider, MD  diphenoxylate-atropine (LOMOTIL) 2.5-0.025 MG  per tablet Take 1 tablet by mouth daily.  03/06/13  Yes Historical Provider, MD  escitalopram (LEXAPRO) 20 MG tablet Take 20 mg by mouth daily.   Yes Historical Provider, MD  levothyroxine (SYNTHROID, LEVOTHROID) 50 MCG tablet Take 50 mcg by mouth daily before breakfast.   Yes Historical Provider, MD  lisdexamfetamine (VYVANSE) 30 MG capsule Take 30 mg by mouth daily.   Yes Historical Provider, MD  Loperamide HCl (IMODIUM A-D PO) Take 1-2 tablets by mouth as needed (diarrhea).    Yes Historical Provider, MD  LORazepam (ATIVAN) 2 MG tablet Take 2-4 mg by mouth daily as needed for anxiety.   Yes Historical Provider, MD  megestrol (MEGACE) 20 MG tablet Take 40 mg by mouth See admin instructions. Take two tablets (40 mg) three times per day time three days,  then take two tablets (40 mg) two times per day time three days,  then take two tablets (40 mg)once per day   Yes Historical Provider, MD  Multiple Vitamin (MULITIVITAMIN WITH MINERALS) TABS Take 1 tablet by mouth daily.    Yes Historical Provider, MD  omeprazole (PRILOSEC) 20 MG capsule Take 20 mg by mouth daily.   Yes Historical Provider, MD  oxyCODONE-acetaminophen (PERCOCET/ROXICET) 5-325 MG per tablet Take 1-2 tablets by mouth every 6 (six) hours as needed for severe pain.   Yes Historical Provider, MD  potassium chloride SA (K-DUR,KLOR-CON) 20 MEQ tablet Take 20 mEq by mouth daily.    Yes Historical Provider, MD  promethazine (PHENERGAN) 25 MG tablet Take 1 tablet (25 mg total) by mouth every 6 (six) hours as needed for nausea. 05/09/13  Yes Charles B. Karle Starch, MD  Vitamin D, Ergocalciferol, (DRISDOL) 50000 UNITS CAPS capsule Take 50,000 Units by mouth every 7 (seven) days. Take on Mondays   Yes Historical Provider, MD   BP 144/86  Pulse 106  Temp(Src) 98.6 F (37 C) (Oral)  Resp 17  SpO2 99%  LMP 08/06/2013 Physical Exam  Nursing note and vitals reviewed. Constitutional: She is oriented to person, place, and time. She appears  well-developed and well-nourished.  HENT:  Head: Normocephalic and atraumatic.  Mouth/Throat: Oropharynx is clear and moist. No oropharyngeal exudate.  Eyes: Conjunctivae and EOM are normal. Pupils are equal, round, and reactive to light.  Neck: Normal range of motion. Neck supple.  Cardiovascular: Normal rate, regular rhythm, normal heart sounds and intact distal pulses.  Exam reveals no gallop and no friction rub.   No murmur heard. Pulmonary/Chest: Effort normal and breath sounds normal. No respiratory distress. She has no wheezes.  Abdominal: Soft. Bowel sounds are normal. There is tenderness (diffuse mild lower abdominal tenderness to palpation which is nonspecific and is not localized.). There is no rebound and no guarding.  Genitourinary:  Normal appearing external vagina.  Normal-appearing cervix with small amount of dark red blood oozing from the os which is closed. Small amount of dark red blood in the posterior fornix. No cervical motion tenderness. Mild left adnexal tenderness.  Musculoskeletal: Normal range of motion. She exhibits no edema and no tenderness.  Neurological: She is alert and oriented to person, place, and time.  Skin: Skin is warm and dry.  Psychiatric: She has a normal mood and affect. Her behavior is normal.    ED Course  Procedures (including critical care time) Labs Review Labs Reviewed  CBC - Abnormal; Notable for the following:    RBC 3.38 (*)    MCV 109.5 (*)    MCH 37.0 (*)    All other components within normal limits  I-STAT CHEM 8, ED - Abnormal; Notable for the following:    Glucose, Bld 106 (*)    All other components within normal limits  WET PREP, GENITAL  GC/CHLAMYDIA PROBE AMP  POC URINE PREG, ED    Imaging Review No results found.   EKG Interpretation None      MDM   Final diagnoses:  None    7:07 PM 48 y.o. female with postmenopausal bleeding who presents with continued vaginal bleeding. She states that she started  having vaginal bleeding on May 5 and it has continued since then. She has taken a course of Megace without relief. She's been seen here twice and evaluated at the [facility]. She states that she had an endometrial biopsy performed and the OB/GYN is planning to perform a hysterectomy which has not yet scheduled. She had CT and [place] here. She notes she has been going through one pad per hour for the last 2 days and having worsening lower abdominal cramping. She denies any fevers, vomiting, or diarrhea. She is afebrile and mildly tachycardic here. She also notes mild dizziness. Screening labwork sent prior to arrival. Will get pain control and IV fluid bolus.  10:09 PM: Pelvic exam reassuring w/ only mild amount of blood. Hgb stable. Pt feeling better after pain control/IVF. Discussed case w/ on call obgyn who recommends increasing megace to 40mg  bid for 5 days then 40mg  q day. Will also provide more pain medicine.  I have discussed the diagnosis/risks/treatment options with the patient and believe the pt to be eligible for discharge home to follow-up with her obgyn. We also discussed returning to the ED immediately if new or worsening sx occur. We discussed the sx which are most concerning (e.g., worsening bleeding, worsening pain, fever) that necessitate immediate return. Medications administered to the patient during their visit and any new prescriptions provided to the patient are listed below.  Medications given during this visit Medications  sodium chloride 0.9 % bolus 1,000 mL (0 mLs Intravenous Stopped 08/25/13 2136)  HYDROmorphone (DILAUDID) injection 1 mg (1 mg Intravenous Given 08/25/13 1936)  promethazine (PHENERGAN) injection 12.5 mg (12.5 mg Intravenous Given 08/25/13 1938)  HYDROmorphone (DILAUDID) injection 1 mg (1 mg Intravenous Given 08/25/13 2107)    Discharge Medication List as of 08/25/2013 10:14 PM    START taking these medications   Details  !! megestrol (MEGACE) 20 MG tablet Take 40 mg twice a  day for 5 days. Then take 40 mg daily after that., Print    !! oxyCODONE-acetaminophen (PERCOCET) 5-325 MG per tablet Take 1-2 tablets by mouth every 6 (six) hours as needed for moderate pain., Starting 08/25/2013, Until Discontinued, Print     !! - Potential duplicate medications found. Please discuss with provider.       Blanchard Kelch, MD 08/25/13 [phone]

## 2013-08-25 NOTE — ED Notes (Signed)
Pt states vaginal bleeding and cramping started on May 5, [facility] and is suppose to have hysterectomy but no date set yet. Pt is stating that she is using more than 1 pad an hour and is having increasing abd cramping.

## 2013-08-25 NOTE — ED Notes (Signed)
Patient with hx of fibroids Patient has been followed [facility] for this issue Patient scheduled to have hysterectomy with Dr. Hulan Fray Patient comes to ED today for c/o vaginal bleeding that has been on-going since the beginning of May Patient ambulatory from triage without difficulty or assistance from nursing staff--appears in NAD

## 2013-08-25 NOTE — Discharge Instructions (Signed)
Abnormal Uterine Bleeding Abnormal uterine bleeding means bleeding from the vagina that is not your normal menstrual period. This can be:  Bleeding or spotting between periods.  Bleeding after sex (sexual intercourse).  Bleeding that is heavier or more than normal.  Periods that last longer than usual.  Bleeding after menopause. There are many problems that may cause this. Treatment will depend on the cause of the bleeding. Any kind of bleeding that is not normal should be reviewed by your doctor.  HOME CARE Watch your condition for any changes. These actions may lessen any discomfort you are having:  Do not use tampons or douches as told by your doctor.  Change your pads often. You should get regular pelvic exams and Pap tests. Keep all appointments for tests as told by your doctor. GET HELP IF:  You are bleeding for more than 1 week.  You feel dizzy at times. GET HELP RIGHT AWAY IF:   You pass out.  You have to change pads every 15 to 30 minutes.  You have belly pain.  You have a fever.  You become sweaty or weak.  You are passing large blood clots from the vagina.  You feel sick to your stomach (nauseous) and throw up (vomit). MAKE SURE YOU:  Understand these instructions.  Will watch your condition.  Will get help right away if you are not doing well or get worse. Document Released: 01/16/2009 Document Revised: 01/09/2013 Document Reviewed: 10/18/2012 [employer] Patient Information 2014 [employer].  

## 2013-08-26 LAB — GC/CHLAMYDIA PROBE AMP
CT Probe RNA: NEGATIVE
GC PROBE AMP APTIMA: NEGATIVE

## 2013-08-27 ENCOUNTER — Ambulatory Visit: Payer: BC Managed Care – PPO | Admitting: Obstetrics & Gynecology

## 2013-09-19 ENCOUNTER — Encounter (HOSPITAL_COMMUNITY): Payer: Self-pay | Admitting: Emergency Medicine

## 2013-09-19 ENCOUNTER — Emergency Department (HOSPITAL_COMMUNITY): Payer: BC Managed Care – PPO

## 2013-09-19 ENCOUNTER — Emergency Department (HOSPITAL_COMMUNITY)
Admission: EM | Admit: 2013-09-19 | Discharge: 2013-09-19 | Disposition: A | Discharge status: Home / Self Care | Payer: BC Managed Care – PPO | Attending: Emergency Medicine | Admitting: Emergency Medicine

## 2013-09-19 DIAGNOSIS — Z8679 Personal history of other diseases of the circulatory system: Secondary | ICD-10-CM | POA: Insufficient documentation

## 2013-09-19 DIAGNOSIS — Z9089 Acquired absence of other organs: Secondary | ICD-10-CM | POA: Insufficient documentation

## 2013-09-19 DIAGNOSIS — Z8719 Personal history of other diseases of the digestive system: Secondary | ICD-10-CM | POA: Insufficient documentation

## 2013-09-19 DIAGNOSIS — G8929 Other chronic pain: Secondary | ICD-10-CM | POA: Diagnosis not present

## 2013-09-19 DIAGNOSIS — R112 Nausea with vomiting, unspecified: Secondary | ICD-10-CM | POA: Insufficient documentation

## 2013-09-19 DIAGNOSIS — Z79899 Other long term (current) drug therapy: Secondary | ICD-10-CM | POA: Insufficient documentation

## 2013-09-19 DIAGNOSIS — R197 Diarrhea, unspecified: Secondary | ICD-10-CM | POA: Insufficient documentation

## 2013-09-19 DIAGNOSIS — F17200 Nicotine dependence, unspecified, uncomplicated: Secondary | ICD-10-CM | POA: Diagnosis not present

## 2013-09-19 DIAGNOSIS — R1013 Epigastric pain: Secondary | ICD-10-CM | POA: Insufficient documentation

## 2013-09-19 DIAGNOSIS — F411 Generalized anxiety disorder: Secondary | ICD-10-CM | POA: Insufficient documentation

## 2013-09-19 DIAGNOSIS — F3289 Other specified depressive episodes: Secondary | ICD-10-CM | POA: Diagnosis not present

## 2013-09-19 DIAGNOSIS — Z862 Personal history of diseases of the blood and blood-forming organs and certain disorders involving the immune mechanism: Secondary | ICD-10-CM | POA: Insufficient documentation

## 2013-09-19 DIAGNOSIS — Z9851 Tubal ligation status: Secondary | ICD-10-CM | POA: Diagnosis not present

## 2013-09-19 DIAGNOSIS — E079 Disorder of thyroid, unspecified: Secondary | ICD-10-CM | POA: Diagnosis not present

## 2013-09-19 DIAGNOSIS — R109 Unspecified abdominal pain: Secondary | ICD-10-CM | POA: Diagnosis not present

## 2013-09-19 DIAGNOSIS — Z8619 Personal history of other infectious and parasitic diseases: Secondary | ICD-10-CM | POA: Diagnosis not present

## 2013-09-19 DIAGNOSIS — Z87442 Personal history of urinary calculi: Secondary | ICD-10-CM | POA: Insufficient documentation

## 2013-09-19 DIAGNOSIS — F329 Major depressive disorder, single episode, unspecified: Secondary | ICD-10-CM | POA: Diagnosis not present

## 2013-09-19 DIAGNOSIS — R1011 Right upper quadrant pain: Secondary | ICD-10-CM | POA: Diagnosis not present

## 2013-09-19 LAB — URINALYSIS, ROUTINE W REFLEX MICROSCOPIC
Bilirubin Urine: NEGATIVE
Glucose, UA: NEGATIVE mg/dL
Ketones, ur: NEGATIVE mg/dL
Leukocytes, UA: NEGATIVE
NITRITE: NEGATIVE
PROTEIN: NEGATIVE mg/dL
Specific Gravity, Urine: 1.025 (ref 1.005–1.030)
Urobilinogen, UA: 0.2 mg/dL (ref 0.0–1.0)
pH: 5.5 (ref 5.0–8.0)

## 2013-09-19 LAB — COMPREHENSIVE METABOLIC PANEL
ALT: 15 U/L (ref 0–35)
AST: 18 U/L (ref 0–37)
Albumin: 3.7 g/dL (ref 3.5–5.2)
Alkaline Phosphatase: 112 U/L (ref 39–117)
BUN: 18 mg/dL (ref 6–23)
CALCIUM: 8.8 mg/dL (ref 8.4–10.5)
CO2: 21 mEq/L (ref 19–32)
Chloride: 108 mEq/L (ref 96–112)
Creatinine, Ser: 0.71 mg/dL (ref 0.50–1.10)
GLUCOSE: 87 mg/dL (ref 70–99)
Potassium: 3.9 mEq/L (ref 3.7–5.3)
Sodium: 143 mEq/L (ref 137–147)
Total Bilirubin: <0.2 mg/dL — ABNORMAL LOW (ref 0.3–1.2)
Total Protein: 6.7 g/dL (ref 6.0–8.3)

## 2013-09-19 LAB — CBC WITH DIFFERENTIAL/PLATELET
Basophils Absolute: 0 10*3/uL (ref 0.0–0.1)
Basophils Relative: 1 % (ref 0–1)
EOS PCT: 2 % (ref 0–5)
Eosinophils Absolute: 0.1 10*3/uL (ref 0.0–0.7)
HEMATOCRIT: 36.6 % (ref 36.0–46.0)
Hemoglobin: 12.5 g/dL (ref 12.0–15.0)
LYMPHS ABS: 1.3 10*3/uL (ref 0.7–4.0)
Lymphocytes Relative: 20 % (ref 12–46)
MCH: 37.5 pg — AB (ref 26.0–34.0)
MCHC: 34.2 g/dL (ref 30.0–36.0)
MCV: 109.9 fL — AB (ref 78.0–100.0)
MONO ABS: 0.4 10*3/uL (ref 0.1–1.0)
Monocytes Relative: 6 % (ref 3–12)
Neutro Abs: 4.8 10*3/uL (ref 1.7–7.7)
Neutrophils Relative %: 71 % (ref 43–77)
PLATELETS: 217 10*3/uL (ref 150–400)
RBC: 3.33 MIL/uL — AB (ref 3.87–5.11)
RDW: 14.5 % (ref 11.5–15.5)
WBC: 6.6 10*3/uL (ref 4.0–10.5)

## 2013-09-19 LAB — RAPID URINE DRUG SCREEN, HOSP PERFORMED
Amphetamines: NOT DETECTED
BARBITURATES: POSITIVE — AB
Benzodiazepines: NOT DETECTED
Cocaine: NOT DETECTED
Opiates: NOT DETECTED
TETRAHYDROCANNABINOL: NOT DETECTED

## 2013-09-19 LAB — LIPASE, BLOOD: Lipase: 11 U/L (ref 11–59)

## 2013-09-19 LAB — URINE MICROSCOPIC-ADD ON

## 2013-09-19 MED ORDER — SODIUM CHLORIDE 0.9 % IV BOLUS (SEPSIS)
1000.0000 mL | Freq: Once | INTRAVENOUS | Status: AC
  Administered 2013-09-19: 1000 mL via INTRAVENOUS

## 2013-09-19 MED ORDER — HYDROMORPHONE HCL PF 1 MG/ML IJ SOLN
1.0000 mg | Freq: Once | INTRAMUSCULAR | Status: AC
  Administered 2013-09-19: 1 mg via INTRAVENOUS
  Filled 2013-09-19: qty 1

## 2013-09-19 MED ORDER — PROMETHAZINE HCL 25 MG/ML IJ SOLN
25.0000 mg | Freq: Once | INTRAMUSCULAR | Status: AC
  Administered 2013-09-19: 25 mg via INTRAVENOUS
  Filled 2013-09-19: qty 1

## 2013-09-19 MED ORDER — SODIUM CHLORIDE 0.9 % IV SOLN: INTRAVENOUS | Status: DC

## 2013-09-19 MED ORDER — LORAZEPAM 2 MG/ML IJ SOLN
1.0000 mg | Freq: Once | INTRAMUSCULAR | Status: AC
  Administered 2013-09-19: 1 mg via INTRAVENOUS
  Filled 2013-09-19: qty 1

## 2013-09-19 NOTE — Discharge Instructions (Signed)

## 2013-09-19 NOTE — ED Provider Notes (Signed)
CSN: [id number]     Arrival date & time 09/19/13  1330 History   First MD Initiated Contact with Patient 09/19/13 1407     Chief Complaint  Patient presents with  . Abdominal Pain  . Nausea  . Emesis  . Diarrhea     (Consider location/radiation/quality/duration/timing/severity/associated sxs/prior Treatment) Patient is a 48 y.o. female presenting with abdominal pain, vomiting, and diarrhea. The history is provided by the patient.  Abdominal Pain Associated symptoms: diarrhea and vomiting   Emesis Associated symptoms: abdominal pain and diarrhea   Diarrhea Associated symptoms: abdominal pain and vomiting    patient here complaining of right upper quadrant and midepigastric abdominal pain that began yesterday. States this feels like either her colitis or pancreatitis. She has had nonbilious vomiting. Watery diarrhea without blood. No urinary symptoms. No fever or chills. Symptoms have been persistent. She denies alcohol use. She is status post cholecystectomy. Symptoms began to become worse overnight. No treatment used prior to arrival. Nothing makes them better or worse  Past Medical History  Diagnosis Date  . Collagenous colitis   . WPW (Wolff-Parkinson-White syndrome)   . Kidney stone   . Addison disease   . Thyroid disease   . Clostridium difficile diarrhea   . Anxiety and depression   . Chronic diarrhea   . Chronic back pain   . Drug-seeking behavior   . Irritable bowel syndrome (IBS)   . Anemia   . Kidney stone   . Pancreatitis    Past Surgical History  Procedure Laterality Date  . Cholecystectomy    . Tubal ligation    . Lithotripsy    . Sphincterotomy    . Cardiac electrophysiology mapping and ablation      for WPW  . Video bronchoscopy  08/31/2011    Procedure: VIDEO BRONCHOSCOPY WITH FLUORO;  Surgeon: Elsie Stain, MD;  Location: [facility];  Service: Cardiopulmonary;  Laterality: N/A;   Family History  Problem Relation Age of Onset  . Prostate cancer  Father   . Coronary artery disease Maternal Grandfather    History  Substance Use Topics  . Smoking status: Current Every Day Smoker -- 0.25 packs/day for 30 years    Types: Cigarettes  . Smokeless tobacco: Never Used  . Alcohol Use: Yes     Comment: seldom   OB History   Grav Para Term Preterm Abortions TAB SAB Ect Mult Living   3 3 3       3      Review of Systems  Gastrointestinal: Positive for vomiting, abdominal pain and diarrhea.  All other systems reviewed and are negative.     Allergies  Prednisone; Ambien; Nsaids; and Zofran  Home Medications   Prior to Admission medications   Medication Sig Start Date End Date Taking? Authorizing Provider  budesonide (ENTOCORT EC) 3 MG 24 hr capsule Take 9 mg by mouth daily.    Yes Historical Provider, MD  butalbital-acetaminophen-caffeine (FIORICET WITH CODEINE) 50-325-40-30 MG per capsule Take 1 capsule by mouth every 4 (four) hours as needed for migraine. 03/13/13  Yes Wendie Agreste, MD  colestipol (COLESTID) 1 G tablet Take 2 g by mouth daily.  12/18/12 12/18/13 Yes Historical Provider, MD  Cyanocobalamin (VITAMIN B 12) 100 MCG LOZG Take 1,000 mcg by mouth daily.    Yes Historical Provider, MD  dicyclomine (BENTYL) 20 MG tablet Take 20 mg by mouth as needed for spasms.  03/30/13 03/30/14 Yes Historical Provider, MD  diphenoxylate-atropine (LOMOTIL) 2.5-0.025 MG per tablet  Take 1 tablet by mouth daily.  03/06/13  Yes Historical Provider, MD  escitalopram (LEXAPRO) 20 MG tablet Take 20 mg by mouth daily.   Yes Historical Provider, MD  levothyroxine (SYNTHROID, LEVOTHROID) 50 MCG tablet Take 50 mcg by mouth daily before breakfast.   Yes Historical Provider, MD  LORazepam (ATIVAN) 2 MG tablet Take 2-4 mg by mouth daily as needed for anxiety.   Yes Historical Provider, MD  Multiple Vitamin (MULITIVITAMIN WITH MINERALS) TABS Take 1 tablet by mouth daily.    Yes Historical Provider, MD  omeprazole (PRILOSEC) 20 MG capsule Take 20 mg by  mouth daily.   Yes Historical Provider, MD  oxyCODONE-acetaminophen (PERCOCET) 5-325 MG per tablet Take 1-2 tablets by mouth every 6 (six) hours as needed for moderate pain. 08/25/13  Yes Blanchard Kelch, MD  potassium chloride SA (K-DUR,KLOR-CON) 20 MEQ tablet Take 20 mEq by mouth daily.    Yes Historical Provider, MD  promethazine (PHENERGAN) 25 MG tablet Take 1 tablet (25 mg total) by mouth every 6 (six) hours as needed for nausea. 05/09/13  Yes Charles B. Karle Starch, MD  Vitamin D, Ergocalciferol, (DRISDOL) 50000 UNITS CAPS capsule Take 50,000 Units by mouth every 7 (seven) days. Take on Mondays   Yes Historical Provider, MD   BP 120/81  Pulse 87  Temp(Src) 98.6 F (37 C) (Oral)  Resp 22  SpO2 96%  LMP 08/06/2013 Physical Exam  Nursing note and vitals reviewed. Constitutional: She is oriented to person, place, and time. She appears well-developed and well-nourished.  Non-toxic appearance. No distress.  HENT:  Head: Normocephalic and atraumatic.  Eyes: Conjunctivae, EOM and lids are normal. Pupils are equal, round, and reactive to light.  Neck: Normal range of motion. Neck supple. No tracheal deviation present. No mass present.  Cardiovascular: Normal rate, regular rhythm and normal heart sounds.  Exam reveals no gallop.   No murmur heard. Pulmonary/Chest: Effort normal and breath sounds normal. No stridor. No respiratory distress. She has no decreased breath sounds. She has no wheezes. She has no rhonchi. She has no rales.  Abdominal: Soft. Normal appearance and bowel sounds are normal. She exhibits no distension. There is tenderness in the epigastric area. There is no rigidity, no rebound, no guarding and no CVA tenderness.    Musculoskeletal: Normal range of motion. She exhibits no edema and no tenderness.  Neurological: She is alert and oriented to person, place, and time. She has normal strength. No cranial nerve deficit or sensory deficit. GCS eye subscore is 4. GCS verbal subscore  is 5. GCS motor subscore is 6.  Skin: Skin is warm and dry. No abrasion and no rash noted.  Psychiatric: She has a normal mood and affect. Her speech is normal and behavior is normal.    ED Course  Procedures (including critical care time) Labs Review Labs Reviewed  CBC WITH DIFFERENTIAL  COMPREHENSIVE METABOLIC PANEL  LIPASE, BLOOD  URINALYSIS, ROUTINE W REFLEX MICROSCOPIC  URINE RAPID DRUG SCREEN (HOSP PERFORMED)    Imaging Review No results found.   EKG Interpretation None      MDM   Final diagnoses:  None    Patient given IV fluids and meds here. Labs are reassuring. No evidence of surgical abdomen. Review of patient's records shows that she has chronic pain along with IBS. Suspect this is the etiology of her symptoms. Stable for discharge    Leota Jacobsen, MD 09/19/13 1601

## 2013-09-19 NOTE — ED Notes (Signed)
Pt c/o abd pain, N/V/D that started Tuesday and became worse over night.

## 2013-09-26 ENCOUNTER — Emergency Department (HOSPITAL_COMMUNITY)
Admission: EM | Admit: 2013-09-26 | Discharge: 2013-09-26 | Disposition: A | Discharge status: Home / Self Care | Payer: BC Managed Care – PPO | Attending: Emergency Medicine | Admitting: Emergency Medicine

## 2013-09-26 ENCOUNTER — Encounter (HOSPITAL_COMMUNITY): Payer: Self-pay | Admitting: Emergency Medicine

## 2013-09-26 DIAGNOSIS — Z862 Personal history of diseases of the blood and blood-forming organs and certain disorders involving the immune mechanism: Secondary | ICD-10-CM | POA: Insufficient documentation

## 2013-09-26 DIAGNOSIS — Z9089 Acquired absence of other organs: Secondary | ICD-10-CM | POA: Insufficient documentation

## 2013-09-26 DIAGNOSIS — Z79899 Other long term (current) drug therapy: Secondary | ICD-10-CM | POA: Insufficient documentation

## 2013-09-26 DIAGNOSIS — F17200 Nicotine dependence, unspecified, uncomplicated: Secondary | ICD-10-CM | POA: Insufficient documentation

## 2013-09-26 DIAGNOSIS — G8929 Other chronic pain: Secondary | ICD-10-CM | POA: Insufficient documentation

## 2013-09-26 DIAGNOSIS — Z8659 Personal history of other mental and behavioral disorders: Secondary | ICD-10-CM | POA: Insufficient documentation

## 2013-09-26 DIAGNOSIS — Z9851 Tubal ligation status: Secondary | ICD-10-CM | POA: Insufficient documentation

## 2013-09-26 DIAGNOSIS — Z8719 Personal history of other diseases of the digestive system: Secondary | ICD-10-CM | POA: Insufficient documentation

## 2013-09-26 DIAGNOSIS — Z87442 Personal history of urinary calculi: Secondary | ICD-10-CM | POA: Insufficient documentation

## 2013-09-26 DIAGNOSIS — R112 Nausea with vomiting, unspecified: Secondary | ICD-10-CM

## 2013-09-26 DIAGNOSIS — R197 Diarrhea, unspecified: Secondary | ICD-10-CM | POA: Insufficient documentation

## 2013-09-26 DIAGNOSIS — R1011 Right upper quadrant pain: Secondary | ICD-10-CM | POA: Insufficient documentation

## 2013-09-26 LAB — COMPREHENSIVE METABOLIC PANEL
ALBUMIN: 4.1 g/dL (ref 3.5–5.2)
ALK PHOS: 128 U/L — AB (ref 39–117)
ALT: 11 U/L (ref 0–35)
AST: 14 U/L (ref 0–37)
BUN: 11 mg/dL (ref 6–23)
CHLORIDE: 104 meq/L (ref 96–112)
CO2: 22 mEq/L (ref 19–32)
Calcium: 9.1 mg/dL (ref 8.4–10.5)
Creatinine, Ser: 0.69 mg/dL (ref 0.50–1.10)
GFR calc Af Amer: >90 mL/min (ref >90)
GFR calc non Af Amer: >90 mL/min (ref >90)
Glucose, Bld: 94 mg/dL (ref 70–99)
POTASSIUM: 2.8 meq/L — AB (ref 3.7–5.3)
SODIUM: 142 meq/L (ref 137–147)
Total Bilirubin: 0.2 mg/dL — ABNORMAL LOW (ref 0.3–1.2)
Total Protein: 7.2 g/dL (ref 6.0–8.3)

## 2013-09-26 LAB — CBC WITH DIFFERENTIAL/PLATELET
BASOS PCT: 1 % (ref 0–1)
Basophils Absolute: 0 10*3/uL (ref 0.0–0.1)
EOS ABS: 0.1 10*3/uL (ref 0.0–0.7)
Eosinophils Relative: 1 % (ref 0–5)
HCT: 37.9 % (ref 36.0–46.0)
Hemoglobin: 13.2 g/dL (ref 12.0–15.0)
Lymphocytes Relative: 26 % (ref 12–46)
Lymphs Abs: 1.8 10*3/uL (ref 0.7–4.0)
MCH: 37.3 pg — AB (ref 26.0–34.0)
MCHC: 34.8 g/dL (ref 30.0–36.0)
MCV: 107.1 fL — ABNORMAL HIGH (ref 78.0–100.0)
Monocytes Absolute: 0.3 10*3/uL (ref 0.1–1.0)
Monocytes Relative: 5 % (ref 3–12)
NEUTROS ABS: 4.7 10*3/uL (ref 1.7–7.7)
NEUTROS PCT: 67 % (ref 43–77)
PLATELETS: 305 10*3/uL (ref 150–400)
RBC: 3.54 MIL/uL — ABNORMAL LOW (ref 3.87–5.11)
RDW: 14.1 % (ref 11.5–15.5)
WBC: 6.9 10*3/uL (ref 4.0–10.5)

## 2013-09-26 LAB — URINALYSIS, ROUTINE W REFLEX MICROSCOPIC
BILIRUBIN URINE: NEGATIVE
Glucose, UA: NEGATIVE mg/dL
HGB URINE DIPSTICK: NEGATIVE
Ketones, ur: NEGATIVE mg/dL
Leukocytes, UA: NEGATIVE
NITRITE: NEGATIVE
PH: 6 (ref 5.0–8.0)
Protein, ur: NEGATIVE mg/dL
Specific Gravity, Urine: 1.022 (ref 1.005–1.030)
Urobilinogen, UA: 0.2 mg/dL (ref 0.0–1.0)

## 2013-09-26 LAB — LIPASE, BLOOD: Lipase: 25 U/L (ref 11–59)

## 2013-09-26 MED ORDER — SODIUM CHLORIDE 0.9 % IV BOLUS (SEPSIS)
1000.0000 mL | Freq: Once | INTRAVENOUS | Status: AC
  Administered 2013-09-26: 1000 mL via INTRAVENOUS

## 2013-09-26 MED ORDER — PROMETHAZINE HCL 25 MG/ML IJ SOLN
25.0000 mg | Freq: Once | INTRAMUSCULAR | Status: AC
  Administered 2013-09-26: 25 mg via INTRAVENOUS
  Filled 2013-09-26: qty 1

## 2013-09-26 MED ORDER — PROMETHAZINE HCL 25 MG PO TABS: 25.0000 mg | ORAL_TABLET | Freq: Four times a day (QID) | ORAL | Status: DC | PRN

## 2013-09-26 MED ORDER — LORAZEPAM 0.5 MG PO TABS: 1.0000 mg | ORAL_TABLET | Freq: Two times a day (BID) | ORAL | Status: DC | PRN

## 2013-09-26 MED ORDER — MORPHINE SULFATE 4 MG/ML IJ SOLN
4.0000 mg | Freq: Once | INTRAMUSCULAR | Status: AC
  Administered 2013-09-26: 4 mg via INTRAVENOUS
  Filled 2013-09-26: qty 1

## 2013-09-26 MED ORDER — FENTANYL CITRATE 0.05 MG/ML IJ SOLN
100.0000 ug | Freq: Once | INTRAMUSCULAR | Status: AC
  Administered 2013-09-26: 100 ug via INTRAVENOUS
  Filled 2013-09-26: qty 2

## 2013-09-26 MED ORDER — POTASSIUM CHLORIDE CRYS ER 20 MEQ PO TBCR
40.0000 meq | EXTENDED_RELEASE_TABLET | Freq: Once | ORAL | Status: AC
  Administered 2013-09-26: 40 meq via ORAL
  Filled 2013-09-26: qty 2

## 2013-09-26 MED ORDER — OXYCODONE-ACETAMINOPHEN 5-325 MG PO TABS: 1.0000 | ORAL_TABLET | Freq: Four times a day (QID) | ORAL | Status: DC | PRN

## 2013-09-26 MED ORDER — DICYCLOMINE HCL 20 MG PO TABS: 20.0000 mg | ORAL_TABLET | Freq: Two times a day (BID) | ORAL | Status: DC

## 2013-09-26 MED ORDER — ONDANSETRON HCL 4 MG/2ML IJ SOLN
4.0000 mg | Freq: Once | INTRAMUSCULAR | Status: AC
  Administered 2013-09-26: 4 mg via INTRAVENOUS
  Filled 2013-09-26: qty 2

## 2013-09-26 NOTE — ED Notes (Signed)
Pt seen her for the same x 1 week ago and c/o of right lower side abdominal pain with nausea, vomiting and diarrhea. Unable to keep down food or fluids since Tuesday. Denies fever at home.

## 2013-09-26 NOTE — ED Provider Notes (Signed)
CSN: [id number]     Arrival date & time 09/26/13  1437 History   First MD Initiated Contact with Patient 09/26/13 1529     Chief Complaint  Patient presents with  . Abdominal Pain  . Nausea  . Diarrhea     (Consider location/radiation/quality/duration/timing/severity/associated sxs/prior Treatment) HPI  Patient to the ER with complaints of abdominal pain that is worse over the past week. She has chronic abdominal pain, diarrhea and vomiting past medical history of IBS, pancreatitis, kidney stones, anxiety and depression. She also has a hx of cholecystectomy. Her pain is epigastric and RUQ. Her home medications have not been working. She has not kept down solid foods but has been able to tolerate fluids. Denies alcohol or drug use, smokes cigarettes only. She denies that her pain and symptoms are different from previous episodes. She denies blood in vomit or diarrhea. Denies foul smell or itching. No fevers, normal VS.  Past Medical History  Diagnosis Date  . Collagenous colitis   . WPW (Wolff-Parkinson-White syndrome)   . Kidney stone   . Addison disease   . Thyroid disease   . Clostridium difficile diarrhea   . Anxiety and depression   . Chronic diarrhea   . Chronic back pain   . Drug-seeking behavior   . Irritable bowel syndrome (IBS)   . Anemia   . Kidney stone   . Pancreatitis    Past Surgical History  Procedure Laterality Date  . Cholecystectomy    . Tubal ligation    . Lithotripsy    . Sphincterotomy    . Cardiac electrophysiology mapping and ablation      for WPW  . Video bronchoscopy  08/31/2011    Procedure: VIDEO BRONCHOSCOPY WITH FLUORO;  Surgeon: Elsie Stain, MD;  Location: [facility];  Service: Cardiopulmonary;  Laterality: N/A;   Family History  Problem Relation Age of Onset  . Prostate cancer Father   . Coronary artery disease Maternal Grandfather    History  Substance Use Topics  . Smoking status: Current Every Day Smoker -- 0.25 packs/day for  30 years    Types: Cigarettes  . Smokeless tobacco: Never Used  . Alcohol Use: Yes     Comment: seldom   OB History   Grav Para Term Preterm Abortions TAB SAB Ect Mult Living   3 3 3       3      Review of Systems  Review of Systems  Gen: no weight loss, fevers, chills, night sweats  Eyes: no discharge or drainage, no occular pain or visual changes  Nose: no epistaxis or rhinorrhea  Mouth: no dental pain, no sore throat  Neck: no neck pain  Lungs:No wheezing, coughing or hemoptysis CV: no chest pain, palpitations, dependent edema or orthopnea  Abd: + abdominal pain, nausea, vomiting, diarrhea GU: no dysuria or gross hematuria  MSK:  No muscle weakness or pain Neuro: no headache, no focal neurologic deficits  Skin: no rash or wounds Psyche: no complaints     Allergies  Prednisone; Ambien; Butrans; Nsaids; and Zofran  Home Medications   Prior to Admission medications   Medication Sig Start Date End Date Taking? Authorizing Provider  budesonide (ENTOCORT EC) 3 MG 24 hr capsule Take 9 mg by mouth every morning.    Yes Historical Provider, MD  colestipol (COLESTID) 1 G tablet Take 2 g by mouth every morning.  12/18/12 12/18/13 Yes Historical Provider, MD  diphenoxylate-atropine (LOMOTIL) 2.5-0.025 MG per tablet Take 1 tablet by  mouth 4 (four) times daily as needed for diarrhea or loose stools.  03/06/13  Yes Historical Provider, MD  escitalopram (LEXAPRO) 20 MG tablet Take 20 mg by mouth every morning.    Yes Historical Provider, MD  LORazepam (ATIVAN) 2 MG tablet Take 2-4 mg by mouth daily as needed for anxiety.   Yes Historical Provider, MD  Multiple Vitamin (MULITIVITAMIN WITH MINERALS) TABS Take 1 tablet by mouth every morning.    Yes Historical Provider, MD  potassium chloride SA (K-DUR,KLOR-CON) 20 MEQ tablet Take 20 mEq by mouth every morning.    Yes Historical Provider, MD  promethazine (PHENERGAN) 25 MG tablet Take 25 mg by mouth every 6 (six) hours as needed for nausea  or vomiting.   Yes Historical Provider, MD  vitamin B-12 (CYANOCOBALAMIN) 1000 MCG tablet Take 1,000 mcg by mouth every morning.   Yes Historical Provider, MD  Vitamin D, Ergocalciferol, (DRISDOL) 50000 UNITS CAPS capsule Take 50,000 Units by mouth every Monday.    Yes Historical Provider, MD  dicyclomine (BENTYL) 20 MG tablet Take 1 tablet (20 mg total) by mouth 2 (two) times daily. 09/26/13   Linus Mako, PA-C  oxyCODONE-acetaminophen (PERCOCET/ROXICET) 5-325 MG per tablet Take 1 tablet by mouth every 6 (six) hours as needed for severe pain. 09/26/13   Bellami Farrelly Marilu Favre, PA-C  promethazine (PHENERGAN) 25 MG tablet Take 1 tablet (25 mg total) by mouth every 6 (six) hours as needed for nausea or vomiting. 09/26/13   Linus Mako, PA-C   BP 137/78  Pulse 75  Temp(Src) 98.3 F (36.8 C) (Oral)  Resp 18  SpO2 99%  LMP 08/06/2013 Physical Exam  Nursing note and vitals reviewed. Constitutional: She appears well-developed and well-nourished. No distress.  HENT:  Head: Normocephalic and atraumatic.  Eyes: Pupils are equal, round, and reactive to light.  Neck: Normal range of motion. Neck supple.  Cardiovascular: Normal rate and regular rhythm.   Pulmonary/Chest: Effort normal.  Abdominal: Soft. She exhibits no distension and no fluid wave. There is tenderness in the right upper quadrant. There is no guarding and no CVA tenderness.  Neurological: She is alert.  Skin: Skin is warm and dry.    ED Course  Procedures (including critical care time) Labs Review Labs Reviewed  CBC WITH DIFFERENTIAL - Abnormal; Notable for the following:    RBC 3.54 (*)    MCV 107.1 (*)    MCH 37.3 (*)    All other components within normal limits  COMPREHENSIVE METABOLIC PANEL - Abnormal; Notable for the following:    Potassium 2.8 (*)    Alkaline Phosphatase 128 (*)    Total Bilirubin 0.2 (*)    All other components within normal limits  URINALYSIS, ROUTINE W REFLEX MICROSCOPIC - Abnormal; Notable for  the following:    APPearance CLOUDY (*)    All other components within normal limits  LIPASE, BLOOD    Imaging Review No results found.   EKG Interpretation None      MDM   Final diagnoses:  Nausea, vomiting and diarrhea   4:41 pm Saline IV fluids, IV Morphine and Zofran 4 mg IV. Nurse unable to draw labs due to concerns that she is volume depleted, will give a small amount of fluids and retry.   6:23 pm The patient is still having intermittent pains although she admits the pain has improved. She reports taking Phenergan PO and Potassium at home but being unable to keep it down. She would like to get to  the point where she can go home. Will try controlling her pain better. 40 meq of potassium given in ED, 2nd liter of fluids ordered.  8:00pm Patient given IV fluids and meds here. Labs are reassuring. No evidence of surgical abdomen. Review of patient's records shows that she has chronic pain along with IBS. Suspect this is the etiology of her symptoms. Stable for discharge after fluids dc  Patient discussed with Dr. Stevie Kern that felt PO potassium here and PO potassium at home is sufficient to improve her potassium so long as her symptoms can be controlled in the ED. Thus far she is feeling much better and her symptoms are controlled. We had a discussion about her going home vs being admitted and she feels that she will be successful at home in controlling her symptoms.  Rx:  dicyclomine (BENTYL) 20 MG tablet Take 1 tablet (20 mg total) by mouth 2 (two) times daily. 20 tablet Linus Mako, PA-C  LORazepam (ATIVAN) 0.5 MG tablet Take 2 tablets (1 mg total) by mouth 2 (two) times daily as needed. 8 tablet Linus Mako, PA-C   oxyCODONE-acetaminophen (PERCOCET/ROXICET) 5-325 MG per tablet Take 1 tablet by mouth every 6 (six) hours as needed for severe pain. 20 tablet Linus Mako, PA-C   promethazine (PHENERGAN) 25 MG tablet Take 1 tablet (25 mg total) by mouth every 6  (six) hours as needed for nausea or vomiting. 30 tablet Linus Mako, PA-C  48 y.o.Dawn Frost's evaluation in the Emergency Department is complete. It has been determined that no acute conditions requiring further emergency intervention are present at this time. The patient/guardian have been advised of the diagnosis and plan. We have discussed signs and symptoms that warrant return to the ED, such as changes or worsening in symptoms.  Vital signs are stable at discharge. Filed Vitals:   09/26/13 2024  BP: 127/78  Pulse: 66  Temp: 98.6 F (37 C)  Resp: 18    Patient/guardian has voiced understanding and agreed to follow-up with the PCP or specialist.   Linus Mako, PA-C 09/28/13 0038

## 2013-09-26 NOTE — Discharge Instructions (Signed)
Nausea and Vomiting °Nausea is a sick feeling that often comes before throwing up (vomiting). Vomiting is a reflex where stomach contents come out of your mouth. Vomiting can cause severe loss of body fluids (dehydration). Children and elderly adults can become dehydrated quickly, especially if they also have diarrhea. Nausea and vomiting are symptoms of a condition or disease. It is important to find the cause of your symptoms. °CAUSES  °· Direct irritation of the stomach lining. This irritation can result from increased acid production (gastroesophageal reflux disease), infection, food poisoning, taking certain medicines (such as nonsteroidal anti-inflammatory drugs), alcohol use, or tobacco use. °· Signals from the brain. These signals could be caused by a headache, heat exposure, an inner ear disturbance, increased pressure in the brain from injury, infection, a tumor, or a concussion, pain, emotional stimulus, or metabolic problems. °· An obstruction in the gastrointestinal tract (bowel obstruction). °· Illnesses such as diabetes, hepatitis, gallbladder problems, appendicitis, kidney problems, cancer, sepsis, atypical symptoms of a heart attack, or eating disorders. °· Medical treatments such as chemotherapy and radiation. °· Receiving medicine that makes you sleep (general anesthetic) during surgery. °DIAGNOSIS °Your caregiver may ask for tests to be done if the problems do not improve after a few days. Tests may also be done if symptoms are severe or if the reason for the nausea and vomiting is not clear. Tests may include: °· Urine tests. °· Blood tests. °· Stool tests. °· Cultures (to look for evidence of infection). °· X-rays or other imaging studies. °Test results can help your caregiver make decisions about treatment or the need for additional tests. °TREATMENT °You need to stay well hydrated. Drink frequently but in small amounts. You may wish to drink water, sports drinks, clear broth, or eat frozen  ice pops or gelatin dessert to help stay hydrated. When you eat, eating slowly may help prevent nausea. There are also some antinausea medicines that may help prevent nausea. °HOME CARE INSTRUCTIONS  °· Take all medicine as directed by your caregiver. °· If you do not have an appetite, do not force yourself to eat. However, you must continue to drink fluids. °· If you have an appetite, eat a normal diet unless your caregiver tells you differently. °¨ Eat a variety of complex carbohydrates (rice, wheat, potatoes, bread), lean meats, yogurt, fruits, and vegetables. °¨ Avoid high-fat foods because they are more difficult to digest. °· Drink enough water and fluids to keep your urine clear or pale yellow. °· If you are dehydrated, ask your caregiver for specific rehydration instructions. Signs of dehydration may include: °¨ Severe thirst. °¨ Dry lips and mouth. °¨ Dizziness. °¨ Dark urine. °¨ Decreasing urine frequency and amount. °¨ Confusion. °¨ Rapid breathing or pulse. °SEEK IMMEDIATE MEDICAL CARE IF:  °· You have blood or brown flecks (like coffee grounds) in your vomit. °· You have black or bloody stools. °· You have a severe headache or stiff neck. °· You are confused. °· You have severe abdominal pain. °· You have chest pain or trouble breathing. °· You do not urinate at least once every 8 hours. °· You develop cold or clammy skin. °· You continue to vomit for longer than 24 to 48 hours. °· You have a fever. °MAKE SURE YOU:  °· Understand these instructions. °· Will watch your condition. °· Will get help right away if you are not doing well or get worse. °Document Released: 03/21/2005 Document Revised: 06/13/2011 Document Reviewed: 08/18/2010 °[employer]® Patient Information ©2015 [employer]. This information is not intended   to replace advice given to you by your health care provider. Make sure you discuss any questions you have with your health care provider.  Diarrhea Diarrhea is frequent loose and watery  bowel movements. It can cause you to feel weak and dehydrated. Dehydration can cause you to become tired and thirsty, have a dry mouth, and have decreased urination that often is dark yellow. Diarrhea is a sign of another problem, most often an infection that will not last long. In most cases, diarrhea typically lasts 2-3 days. However, it can last longer if it is a sign of something more serious. It is important to treat your diarrhea as directed by your caregive to lessen or prevent future episodes of diarrhea. CAUSES  Some common causes include:  Gastrointestinal infections caused by viruses, bacteria, or parasites.  Food poisoning or food allergies.  Certain medicines, such as antibiotics, chemotherapy, and laxatives.  Artificial sweeteners and fructose.  Digestive disorders. HOME CARE INSTRUCTIONS  Ensure adequate fluid intake (hydration): have 1 cup (8 oz) of fluid for each diarrhea episode. Avoid fluids that contain simple sugars or sports drinks, fruit juices, whole milk products, and sodas. Your urine should be clear or pale yellow if you are drinking enough fluids. Hydrate with an oral rehydration solution that you can purchase at pharmacies, retail stores, and online. You can prepare an oral rehydration solution at home by mixing the following ingredients together:   - tsp table salt.   tsp baking soda.   tsp salt substitute containing potassium chloride.  1  tablespoons sugar.  1 L (34 oz) of water.  Certain foods and beverages may increase the speed at which food moves through the gastrointestinal (GI) tract. These foods and beverages should be avoided and include:  Caffeinated and alcoholic beverages.  High-fiber foods, such as raw fruits and vegetables, nuts, seeds, and whole grain breads and cereals.  Foods and beverages sweetened with sugar alcohols, such as xylitol, sorbitol, and mannitol.  Some foods may be well tolerated and may help thicken stool  including:  Starchy foods, such as rice, toast, pasta, low-sugar cereal, oatmeal, grits, baked potatoes, crackers, and bagels.  Bananas.  Applesauce.  Add probiotic-rich foods to help increase healthy bacteria in the GI tract, such as yogurt and fermented milk products.  Wash your hands well after each diarrhea episode.  Only take over-the-counter or prescription medicines as directed by your caregiver.  Take a warm bath to relieve any burning or pain from frequent diarrhea episodes. SEEK IMMEDIATE MEDICAL CARE IF:   You are unable to keep fluids down.  You have persistent vomiting.  You have blood in your stool, or your stools are black and tarry.  You do not urinate in 6-8 hours, or there is only a small amount of very dark urine.  You have abdominal pain that increases or localizes.  You have weakness, dizziness, confusion, or lightheadedness.  You have a severe headache.  Your diarrhea gets worse or does not get better.  You have a fever or persistent symptoms for more than 2-3 days.  You have a fever and your symptoms suddenly get worse. MAKE SURE YOU:   Understand these instructions.  Will watch your condition.  Will get help right away if you are not doing well or get worse. Document Released: 03/11/2002 Document Revised: 03/07/2012 Document Reviewed: 11/27/2011 [facility] Patient Information 2015 [place], [place]. This information is not intended to replace advice given to you by your health care provider. Make sure you discuss  any questions you have with your health care provider. ° °

## 2013-09-29 NOTE — ED Provider Notes (Signed)
Medical screening examination/treatment/procedure(s) were performed by non-physician practitioner and as supervising physician I was immediately available for consultation/collaboration.   EKG Interpretation   Date/Time:  Thursday September 26 2013 18:00:56 EDT Ventricular Rate:  69 PR Interval:  183 QRS Duration: 90 QT Interval:  419 QTC Calculation: 449 R Axis:   0 Text Interpretation:  Sinus rhythm Anteroseptal infarct, age indeterminate  ED PHYSICIAN INTERPRETATION AVAILABLE IN [facility] Confirmed by  TEST, Record ([id number]) on 09/28/2013 8:44:21 AM       Babette Relic, MD 09/29/13 2110

## 2013-10-21 ENCOUNTER — Encounter (HOSPITAL_COMMUNITY): Payer: Self-pay | Admitting: Pharmacy Technician

## 2013-10-24 ENCOUNTER — Encounter (HOSPITAL_COMMUNITY)
Admission: RE | Admit: 2013-10-24 | Discharge: 2013-10-24 | Disposition: A | Discharge status: Home / Self Care | Payer: BC Managed Care – PPO | Source: Ambulatory Visit | Attending: Obstetrics & Gynecology | Admitting: Obstetrics & Gynecology

## 2013-10-24 ENCOUNTER — Encounter (HOSPITAL_COMMUNITY): Payer: Self-pay

## 2013-10-24 DIAGNOSIS — Z01812 Encounter for preprocedural laboratory examination: Secondary | ICD-10-CM | POA: Insufficient documentation

## 2013-10-24 HISTORY — DX: Depression, unspecified: F32.A

## 2013-10-24 HISTORY — DX: Anxiety disorder, unspecified: F41.9

## 2013-10-24 HISTORY — DX: Major depressive disorder, single episode, unspecified: F32.9

## 2013-10-24 LAB — BASIC METABOLIC PANEL
Anion gap: 13 (ref 5–15)
BUN: 17 mg/dL (ref 6–23)
CO2: 25 mEq/L (ref 19–32)
CREATININE: 0.74 mg/dL (ref 0.50–1.10)
Calcium: 9.2 mg/dL (ref 8.4–10.5)
Chloride: 105 mEq/L (ref 96–112)
GFR calc Af Amer: >90 mL/min (ref >90)
GLUCOSE: 96 mg/dL (ref 70–99)
Potassium: 4.4 mEq/L (ref 3.7–5.3)
SODIUM: 143 meq/L (ref 137–147)

## 2013-10-24 LAB — CBC
HCT: 38.9 % (ref 36.0–46.0)
HEMOGLOBIN: 13 g/dL (ref 12.0–15.0)
MCH: 36.4 pg — ABNORMAL HIGH (ref 26.0–34.0)
MCHC: 33.4 g/dL (ref 30.0–36.0)
MCV: 109 fL — ABNORMAL HIGH (ref 78.0–100.0)
Platelets: 242 10*3/uL (ref 150–400)
RBC: 3.57 MIL/uL — ABNORMAL LOW (ref 3.87–5.11)
RDW: 13.1 % (ref 11.5–15.5)
WBC: 11.4 10*3/uL — ABNORMAL HIGH (ref 4.0–10.5)

## 2013-10-24 NOTE — Patient Instructions (Signed)
[place]  10/24/2013   Your procedure is scheduled on:  10/28/13  Enter through the Main Entrance of [facility] up the phone at the desk and dial [phone].   Call this number if you have problems the morning of surgery: [phone]   Remember:   Do not eat food:After Midnight.  Do not drink clear liquids: After Midnight.  Take these medicines the morning of surgery with A SIP OF WATER: Lexapro   Do not wear jewelry, make-up or nail polish.  Do not wear lotions, powders, or perfumes. You may wear deodorant.  Do not shave 48 hours prior to surgery.  Do not bring valuables to the hospital.  [facility] is not   responsible for any belongings or valuables brought to the hospital.  Contacts, dentures or bridgework may not be worn into surgery.  Leave suitcase in the car. After surgery it may be brought to your room.  For patients admitted to the hospital, checkout time is 11:00 AM the day of              discharge.   Patients discharged the day of surgery will not be allowed to drive             home.  Name and phone number of your driver: NA  Special Instructions:      Please read over the following fact sheets that you were given:   Surgical Site Infection Prevention

## 2013-10-28 ENCOUNTER — Encounter (HOSPITAL_COMMUNITY): Payer: BC Managed Care – PPO | Admitting: Anesthesiology

## 2013-10-28 ENCOUNTER — Encounter (HOSPITAL_COMMUNITY): Payer: Self-pay | Admitting: *Deleted

## 2013-10-28 ENCOUNTER — Encounter (HOSPITAL_COMMUNITY)
Admission: RE | Disposition: A | Discharge status: Home / Self Care | Payer: Self-pay | Source: Ambulatory Visit | Attending: Obstetrics & Gynecology

## 2013-10-28 ENCOUNTER — Observation Stay (HOSPITAL_COMMUNITY)
Admission: RE | Admit: 2013-10-28 | Discharge: 2013-10-29 | Disposition: A | Discharge status: Home / Self Care | Payer: BC Managed Care – PPO | Source: Ambulatory Visit | Attending: Obstetrics & Gynecology | Admitting: Obstetrics & Gynecology

## 2013-10-28 ENCOUNTER — Ambulatory Visit (HOSPITAL_COMMUNITY): Payer: BC Managed Care – PPO | Admitting: Anesthesiology

## 2013-10-28 DIAGNOSIS — G8929 Other chronic pain: Secondary | ICD-10-CM | POA: Insufficient documentation

## 2013-10-28 DIAGNOSIS — N950 Postmenopausal bleeding: Principal | ICD-10-CM | POA: Insufficient documentation

## 2013-10-28 DIAGNOSIS — N838 Other noninflammatory disorders of ovary, fallopian tube and broad ligament: Secondary | ICD-10-CM | POA: Insufficient documentation

## 2013-10-28 DIAGNOSIS — Z9889 Other specified postprocedural states: Secondary | ICD-10-CM

## 2013-10-28 DIAGNOSIS — F17200 Nicotine dependence, unspecified, uncomplicated: Secondary | ICD-10-CM | POA: Insufficient documentation

## 2013-10-28 DIAGNOSIS — N949 Unspecified condition associated with female genital organs and menstrual cycle: Secondary | ICD-10-CM | POA: Insufficient documentation

## 2013-10-28 DIAGNOSIS — D252 Subserosal leiomyoma of uterus: Secondary | ICD-10-CM | POA: Insufficient documentation

## 2013-10-28 DIAGNOSIS — D251 Intramural leiomyoma of uterus: Secondary | ICD-10-CM | POA: Insufficient documentation

## 2013-10-28 DIAGNOSIS — I456 Pre-excitation syndrome: Secondary | ICD-10-CM | POA: Insufficient documentation

## 2013-10-28 DIAGNOSIS — N809 Endometriosis, unspecified: Secondary | ICD-10-CM

## 2013-10-28 DIAGNOSIS — K589 Irritable bowel syndrome without diarrhea: Secondary | ICD-10-CM | POA: Insufficient documentation

## 2013-10-28 DIAGNOSIS — D649 Anemia, unspecified: Secondary | ICD-10-CM | POA: Insufficient documentation

## 2013-10-28 DIAGNOSIS — K5289 Other specified noninfective gastroenteritis and colitis: Secondary | ICD-10-CM | POA: Insufficient documentation

## 2013-10-28 DIAGNOSIS — N289 Disorder of kidney and ureter, unspecified: Secondary | ICD-10-CM | POA: Insufficient documentation

## 2013-10-28 DIAGNOSIS — E079 Disorder of thyroid, unspecified: Secondary | ICD-10-CM | POA: Insufficient documentation

## 2013-10-28 DIAGNOSIS — E039 Hypothyroidism, unspecified: Secondary | ICD-10-CM | POA: Insufficient documentation

## 2013-10-28 DIAGNOSIS — N8003 Adenomyosis of the uterus: Secondary | ICD-10-CM

## 2013-10-28 DIAGNOSIS — F341 Dysthymic disorder: Secondary | ICD-10-CM | POA: Insufficient documentation

## 2013-10-28 DIAGNOSIS — E2749 Other adrenocortical insufficiency: Secondary | ICD-10-CM | POA: Insufficient documentation

## 2013-10-28 HISTORY — PX: SALPINGOOPHORECTOMY: SHX82

## 2013-10-28 HISTORY — PX: ABDOMINAL HYSTERECTOMY: SHX81

## 2013-10-28 HISTORY — PX: VAGINAL HYSTERECTOMY: SHX2639

## 2013-10-28 LAB — PREGNANCY, URINE: Preg Test, Ur: NEGATIVE

## 2013-10-28 SURGERY — Surgical Case
Anesthesia: *Unknown

## 2013-10-28 SURGERY — HYSTERECTOMY, VAGINAL
Anesthesia: General | Site: Vagina

## 2013-10-28 MED ORDER — GLYCOPYRROLATE 0.2 MG/ML IJ SOLN
INTRAMUSCULAR | Status: DC | PRN
  Administered 2013-10-28: .6 mg via INTRAVENOUS

## 2013-10-28 MED ORDER — ESCITALOPRAM OXALATE 20 MG PO TABS
20.0000 mg | ORAL_TABLET | Freq: Every morning | ORAL | Status: DC
  Administered 2013-10-29: 20 mg via ORAL
  Filled 2013-10-28: qty 1

## 2013-10-28 MED ORDER — KETOROLAC TROMETHAMINE 30 MG/ML IJ SOLN
INTRAMUSCULAR | Status: AC
  Filled 2013-10-28: qty 1

## 2013-10-28 MED ORDER — FENTANYL CITRATE 0.05 MG/ML IJ SOLN
INTRAMUSCULAR | Status: AC
  Filled 2013-10-28: qty 2

## 2013-10-28 MED ORDER — DEXAMETHASONE SODIUM PHOSPHATE 10 MG/ML IJ SOLN
INTRAMUSCULAR | Status: AC
  Filled 2013-10-28: qty 1

## 2013-10-28 MED ORDER — MIDAZOLAM HCL 2 MG/2ML IJ SOLN
INTRAMUSCULAR | Status: AC
  Filled 2013-10-28: qty 2

## 2013-10-28 MED ORDER — PROMETHAZINE HCL 25 MG/ML IJ SOLN: 6.2500 mg | INTRAMUSCULAR | Status: DC | PRN

## 2013-10-28 MED ORDER — ESTRADIOL 0.1 MG/GM VA CREA
TOPICAL_CREAM | VAGINAL | Status: DC | PRN
  Administered 2013-10-28: 1 via VAGINAL

## 2013-10-28 MED ORDER — EPHEDRINE SULFATE 50 MG/ML IJ SOLN
INTRAMUSCULAR | Status: DC | PRN
  Administered 2013-10-28 (×3): 10 mg via INTRAVENOUS

## 2013-10-28 MED ORDER — BUPIVACAINE HCL (PF) 0.5 % IJ SOLN
INTRAMUSCULAR | Status: AC
  Filled 2013-10-28: qty 30

## 2013-10-28 MED ORDER — ROCURONIUM BROMIDE 100 MG/10ML IV SOLN
INTRAVENOUS | Status: DC | PRN
  Administered 2013-10-28: 35 mg via INTRAVENOUS

## 2013-10-28 MED ORDER — LORAZEPAM 1 MG PO TABS
1.0000 mg | ORAL_TABLET | Freq: Three times a day (TID) | ORAL | Status: DC | PRN
Start: 2013-10-28 — End: 2013-10-29
  Administered 2013-10-28 – 2013-10-29 (×2): 1 mg via ORAL
  Filled 2013-10-28 (×2): qty 1

## 2013-10-28 MED ORDER — EPHEDRINE 5 MG/ML INJ
INTRAVENOUS | Status: AC
  Filled 2013-10-28: qty 10

## 2013-10-28 MED ORDER — CEFAZOLIN SODIUM-DEXTROSE 2-3 GM-% IV SOLR
2.0000 g | INTRAVENOUS | Status: AC
  Administered 2013-10-28: 2 g via INTRAVENOUS

## 2013-10-28 MED ORDER — SODIUM CHLORIDE 0.9 % IJ SOLN
INTRAMUSCULAR | Status: DC | PRN
  Administered 2013-10-28: 100 mL via INTRAVENOUS

## 2013-10-28 MED ORDER — ONDANSETRON HCL 4 MG/2ML IJ SOLN
INTRAMUSCULAR | Status: AC
  Filled 2013-10-28: qty 2

## 2013-10-28 MED ORDER — NEOSTIGMINE METHYLSULFATE 10 MG/10ML IV SOLN
INTRAVENOUS | Status: DC | PRN
  Administered 2013-10-28: 3 mg via INTRAVENOUS

## 2013-10-28 MED ORDER — MEPERIDINE HCL 25 MG/ML IJ SOLN: 6.2500 mg | INTRAMUSCULAR | Status: DC | PRN

## 2013-10-28 MED ORDER — SODIUM CHLORIDE 0.9 % IJ SOLN
INTRAMUSCULAR | Status: AC
  Filled 2013-10-28: qty 100

## 2013-10-28 MED ORDER — SCOPOLAMINE 1 MG/3DAYS TD PT72
1.0000 | MEDICATED_PATCH | TRANSDERMAL | Status: DC
  Administered 2013-10-28: 1.5 mg via TRANSDERMAL

## 2013-10-28 MED ORDER — ROCURONIUM BROMIDE 100 MG/10ML IV SOLN
INTRAVENOUS | Status: AC
Start: 2013-10-28 — End: 2013-10-28
  Filled 2013-10-28: qty 1

## 2013-10-28 MED ORDER — PROMETHAZINE HCL 25 MG/ML IJ SOLN
12.5000 mg | Freq: Four times a day (QID) | INTRAMUSCULAR | Status: DC | PRN
  Administered 2013-10-28: 12.5 mg via INTRAVENOUS
  Filled 2013-10-28: qty 1

## 2013-10-28 MED ORDER — PROPOFOL 10 MG/ML IV BOLUS
INTRAVENOUS | Status: DC | PRN
  Administered 2013-10-28: 200 mg via INTRAVENOUS

## 2013-10-28 MED ORDER — ESTRADIOL 0.1 MG/GM VA CREA
TOPICAL_CREAM | VAGINAL | Status: AC
  Filled 2013-10-28: qty 42.5

## 2013-10-28 MED ORDER — PROPOFOL 10 MG/ML IV EMUL
INTRAVENOUS | Status: AC
  Filled 2013-10-28: qty 20

## 2013-10-28 MED ORDER — PHENYLEPHRINE 40 MCG/ML (10ML) SYRINGE FOR IV PUSH (FOR BLOOD PRESSURE SUPPORT)
PREFILLED_SYRINGE | INTRAVENOUS | Status: AC
Start: 2013-10-28 — End: 2013-10-28
  Filled 2013-10-28: qty 5

## 2013-10-28 MED ORDER — GLYCOPYRROLATE 0.2 MG/ML IJ SOLN
INTRAMUSCULAR | Status: AC
  Filled 2013-10-28: qty 3

## 2013-10-28 MED ORDER — OXYCODONE-ACETAMINOPHEN 5-325 MG PO TABS
1.0000 | ORAL_TABLET | ORAL | Status: DC | PRN
  Administered 2013-10-29 (×2): 2 via ORAL
  Filled 2013-10-28 (×2): qty 2

## 2013-10-28 MED ORDER — FENTANYL CITRATE 0.05 MG/ML IJ SOLN
INTRAMUSCULAR | Status: AC
  Filled 2013-10-28: qty 5

## 2013-10-28 MED ORDER — HYDROMORPHONE HCL PF 1 MG/ML IJ SOLN
INTRAMUSCULAR | Status: DC | PRN
Start: 2013-10-28 — End: 2013-10-28
  Administered 2013-10-28 (×2): .5 mg via INTRAVENOUS

## 2013-10-28 MED ORDER — LACTATED RINGERS IV SOLN
INTRAVENOUS | Status: DC
  Administered 2013-10-28 (×2): via INTRAVENOUS

## 2013-10-28 MED ORDER — HYDROMORPHONE HCL PF 1 MG/ML IJ SOLN
0.2000 mg | INTRAMUSCULAR | Status: DC | PRN
  Administered 2013-10-28 (×5): 0.6 mg via INTRAVENOUS
  Filled 2013-10-28 (×5): qty 1

## 2013-10-28 MED ORDER — PROMETHAZINE HCL 25 MG PO TABS
25.0000 mg | ORAL_TABLET | Freq: Four times a day (QID) | ORAL | Status: DC | PRN
  Administered 2013-10-29 (×2): 25 mg via ORAL
  Filled 2013-10-28 (×2): qty 1

## 2013-10-28 MED ORDER — ACETAMINOPHEN 10 MG/ML IV SOLN
1000.0000 mg | Freq: Four times a day (QID) | INTRAVENOUS | Status: DC
  Administered 2013-10-28: 1000 mg via INTRAVENOUS
  Filled 2013-10-28: qty 100

## 2013-10-28 MED ORDER — FENTANYL CITRATE 0.05 MG/ML IJ SOLN
25.0000 ug | INTRAMUSCULAR | Status: DC | PRN
  Administered 2013-10-28 (×4): 50 ug via INTRAVENOUS

## 2013-10-28 MED ORDER — FENTANYL CITRATE 0.05 MG/ML IJ SOLN
INTRAMUSCULAR | Status: DC | PRN
  Administered 2013-10-28 (×4): 50 ug via INTRAVENOUS
  Administered 2013-10-28: 100 ug via INTRAVENOUS
  Administered 2013-10-28: 50 ug via INTRAVENOUS

## 2013-10-28 MED ORDER — CEFAZOLIN SODIUM-DEXTROSE 2-3 GM-% IV SOLR
INTRAVENOUS | Status: AC
  Filled 2013-10-28: qty 50

## 2013-10-28 MED ORDER — ACETAMINOPHEN 10 MG/ML IV SOLN: 1000.0000 mg | Freq: Once | INTRAVENOUS | Status: DC

## 2013-10-28 MED ORDER — SCOPOLAMINE 1 MG/3DAYS TD PT72
MEDICATED_PATCH | TRANSDERMAL | Status: DC
Start: 2013-10-28 — End: 2013-10-29
  Administered 2013-10-28: 1.5 mg via TRANSDERMAL
  Filled 2013-10-28: qty 1

## 2013-10-28 MED ORDER — MIDAZOLAM HCL 2 MG/2ML IJ SOLN
INTRAMUSCULAR | Status: DC | PRN
  Administered 2013-10-28: 2 mg via INTRAVENOUS

## 2013-10-28 MED ORDER — LIDOCAINE HCL (CARDIAC) 20 MG/ML IV SOLN
INTRAVENOUS | Status: DC | PRN
  Administered 2013-10-28: 80 mg via INTRAVENOUS

## 2013-10-28 MED ORDER — NEOSTIGMINE METHYLSULFATE 10 MG/10ML IV SOLN
INTRAVENOUS | Status: AC
  Filled 2013-10-28: qty 1

## 2013-10-28 MED ORDER — BUPIVACAINE HCL (PF) 0.5 % IJ SOLN
INTRAMUSCULAR | Status: DC | PRN
  Administered 2013-10-28: 30 mL

## 2013-10-28 MED ORDER — HYDROMORPHONE HCL PF 1 MG/ML IJ SOLN
INTRAMUSCULAR | Status: AC
  Filled 2013-10-28: qty 1

## 2013-10-28 MED ORDER — LIDOCAINE HCL (CARDIAC) 20 MG/ML IV SOLN
INTRAVENOUS | Status: AC
  Filled 2013-10-28: qty 5

## 2013-10-28 MED ORDER — MIDAZOLAM HCL 2 MG/2ML IJ SOLN: 0.5000 mg | Freq: Once | INTRAMUSCULAR | Status: DC | PRN

## 2013-10-28 SURGICAL SUPPLY — 55 items
APPLICATOR COTTON TIP 6IN STRL (MISCELLANEOUS) IMPLANT
BLADE 15 SAFETY STRL DISP (BLADE) ×5 IMPLANT
CABLE HIGH FREQUENCY MONO STRZ (ELECTRODE) IMPLANT
CLOTH BEACON ORANGE TIMEOUT ST (SAFETY) ×5 IMPLANT
CONT PATH 16OZ SNAP LID 3702 (MISCELLANEOUS) IMPLANT
COVER LIGHT HANDLE  1/PK (MISCELLANEOUS)
COVER LIGHT HANDLE 1/PK (MISCELLANEOUS) IMPLANT
COVER MAYO STAND STRL (DRAPES) ×5 IMPLANT
COVER TABLE BACK 60X90 (DRAPES) ×5 IMPLANT
DERMABOND ADVANCED (GAUZE/BANDAGES/DRESSINGS)
DERMABOND ADVANCED .7 DNX12 (GAUZE/BANDAGES/DRESSINGS) IMPLANT
DEVICE SUTURE ENDOST 10MM (ENDOMECHANICALS) IMPLANT
DURAPREP 26ML APPLICATOR (WOUND CARE) ×10 IMPLANT
ENDOSTITCH 0 SINGLE 48 (SUTURE) IMPLANT
EVACUATOR SMOKE 8.L (FILTER) IMPLANT
GAUZE PACKING 2X5 YD STRL (GAUZE/BANDAGES/DRESSINGS) ×5 IMPLANT
GLOVE BIO SURGEON STRL SZ 6.5 (GLOVE) ×4 IMPLANT
GLOVE BIO SURGEONS STRL SZ 6.5 (GLOVE) ×1
GLOVE BIOGEL PI IND STRL 7.0 (GLOVE) ×6 IMPLANT
GLOVE BIOGEL PI INDICATOR 7.0 (GLOVE) ×4
GOWN STRL REUS W/ TWL LRG LVL3 (GOWN DISPOSABLE) ×12 IMPLANT
GOWN STRL REUS W/TWL LRG LVL3 (GOWN DISPOSABLE) ×8
MANIPULATOR UTERINE 4.5 ZUMI (MISCELLANEOUS) IMPLANT
NEEDLE SPNL 18GX3.5 QUINCKE PK (NEEDLE) ×5 IMPLANT
NS IRRIG 1000ML POUR BTL (IV SOLUTION) ×5 IMPLANT
OCCLUDER COLPOPNEUMO (BALLOONS) ×5 IMPLANT
PACK LAPAROSCOPY BASIN (CUSTOM PROCEDURE TRAY) ×5 IMPLANT
PENCIL BUTTON HOLSTER BLD 10FT (ELECTRODE) ×5 IMPLANT
SCISSORS LAP 5X35 DISP (ENDOMECHANICALS) IMPLANT
SEALER TISSUE G2 CVD JAW 35 (ENDOMECHANICALS) IMPLANT
SEALER TISSUE G2 CVD JAW 45CM (ENDOMECHANICALS)
SET CYSTO W/LG BORE CLAMP LF (SET/KITS/TRAYS/PACK) IMPLANT
SET IRRIG TUBING LAPAROSCOPIC (IRRIGATION / IRRIGATOR) IMPLANT
SHEARS HARMONIC ACE PLUS 36CM (ENDOMECHANICALS) ×5 IMPLANT
SUT VIC AB 0 CT1 27 (SUTURE)
SUT VIC AB 0 CT1 27XBRD ANBCTR (SUTURE) IMPLANT
SUT VIC AB 3-0 X1 27 (SUTURE) IMPLANT
SUT VICRYL 0 UR6 27IN ABS (SUTURE) IMPLANT
SUT VICRYL RAPIDE 3 0 (SUTURE) IMPLANT
SYRINGE 60CC LL (MISCELLANEOUS) ×5 IMPLANT
TIP UTERINE 5.1X6CM LAV DISP (MISCELLANEOUS) IMPLANT
TIP UTERINE 6.7X10CM GRN DISP (MISCELLANEOUS) IMPLANT
TIP UTERINE 6.7X6CM WHT DISP (MISCELLANEOUS) IMPLANT
TIP UTERINE 6.7X8CM BLUE DISP (MISCELLANEOUS) IMPLANT
TOWEL OR 17X24 6PK STRL BLUE (TOWEL DISPOSABLE) ×10 IMPLANT
TRAY FOLEY CATH 14FR (SET/KITS/TRAYS/PACK) ×5 IMPLANT
TROCAR BALLN 12MMX100 BLUNT (TROCAR) IMPLANT
TROCAR XCEL NON-BLD 11X100MML (ENDOMECHANICALS) ×10 IMPLANT
TROCAR XCEL NON-BLD 5MMX100MML (ENDOMECHANICALS) IMPLANT
TROCAR XCEL OPT SLVE 5M 100M (ENDOMECHANICALS) IMPLANT
TUBING NON-CON 1/4 X 20 CONN (TUBING) ×4 IMPLANT
TUBING NON-CON 1/4 X 20' CONN (TUBING) ×1
WARMER LAPAROSCOPE (MISCELLANEOUS) IMPLANT
WATER STERILE IRR 1000ML POUR (IV SOLUTION) IMPLANT
YANKAUER SUCT BULB TIP NO VENT (SUCTIONS) ×5 IMPLANT

## 2013-10-28 NOTE — Anesthesia Preprocedure Evaluation (Addendum)
Anesthesia Evaluation  Patient identified by MRN, date of birth, ID band Patient awake    Reviewed: Allergy & Precautions, H&P , Patient's Chart, lab work & pertinent test results, reviewed documented beta blocker date and time   History of Anesthesia Complications Negative for: history of anesthetic complications  Airway Mallampati: II TM Distance: >3 FB Neck ROM: full    Dental   Pulmonary Current Smoker,  breath sounds clear to auscultation        Cardiovascular Exercise Tolerance: Good Rhythm:regular Rate:Normal     Neuro/Psych    GI/Hepatic   Endo/Other  Hypothyroidism   Renal/GU Renal disease     Musculoskeletal   Abdominal   Peds  Hematology  (+) anemia ,   Anesthesia Other Findings WPW- s/p ablation  Patient says she was diagnosed with Addison's 4 years ago but never had symptoms and was never placed on medications.  Collagenous colitis but recently no symptoms and electrolytes have been normal for a while per patient.  Reproductive/Obstetrics                          Anesthesia Physical Anesthesia Plan  ASA: III  Anesthesia Plan: General ETT   Post-op Pain Management:    Induction:   Airway Management Planned:   Additional Equipment:   Intra-op Plan:   Post-operative Plan:   Informed Consent: I have reviewed the patients History and Physical, chart, labs and discussed the procedure including the risks, benefits and alternatives for the proposed anesthesia with the patient or authorized representative who has indicated his/her understanding and acceptance.   Dental Advisory Given  Plan Discussed with: CRNA and Surgeon  Anesthesia Plan Comments:         Anesthesia Quick Evaluation

## 2013-10-28 NOTE — Anesthesia Postprocedure Evaluation (Signed)
Anesthesia Post Note  Patient: Dawn Frost  Procedure(s) Performed: Procedure(s) (LRB): HYSTERECTOMY VAGINAL SALPINGO OOPHORECTOMY (Bilateral)  Anesthesia type: GA  Patient location: PACU  Post pain: Pain level controlled  Post assessment: Post-op Vital signs reviewed  Last Vitals:  Filed Vitals:   10/28/13 1145  BP: 136/91  Pulse: 89  Temp:   Resp: 16    Post vital signs: Reviewed  Level of consciousness: sedated  Complications: No apparent anesthesia complications

## 2013-10-28 NOTE — Addendum Note (Signed)
Addendum created 10/28/13 1549 by Elenore Paddy, CRNA   Modules edited: Notes Section   Notes Section:  File: [id number]

## 2013-10-28 NOTE — H&P (Addendum)
Dawn Frost is an 48 y.o. MW Dawn Frost (26,20,17 yo kids) female here today for a TLH/BS. She has chronic pelvic pain. Her LMP was in 2013 but she had PMB in 2015 and work up was negative with negative EMBX. Her u/s showed a fibroid. She would like to have uterus, tubes, and ovaries removed. She has had hot flashes since 2013 but they stopped and have now returned with a vengence. She also has vaginal dryness.   Pertinent Gynecological History:  Bleeding: post menopausal bleeding Contraception: none DES exposure: denies Blood transfusions: none Sexually transmitted diseases: no past history Previous GYN Procedures: none  Last mammogram: normal Date: 2013 Last pap: normal Date: 2015     Menstrual History: Menarche age: 6 Patient's last menstrual period was 08/06/2013.    Past Medical History  Diagnosis Date  . Collagenous colitis   . WPW (Wolff-Parkinson-White syndrome)   . Addison disease   . Thyroid disease   . Clostridium difficile diarrhea   . Anxiety and depression   . Chronic diarrhea   . Chronic back pain   . Drug-seeking behavior   . Irritable bowel syndrome (IBS)   . Anemia   . Pancreatitis   . Kidney stone   . Kidney stone   . Depression   . Anxiety   . Vaginal delivery 3810,1751, 1998    Past Surgical History  Procedure Laterality Date  . Cholecystectomy    . Tubal ligation    . Lithotripsy    . Sphincterotomy    . Cardiac electrophysiology mapping and ablation      for WPW  . Video bronchoscopy  08/31/2011    Procedure: VIDEO BRONCHOSCOPY WITH FLUORO;  Surgeon: Elsie Stain, MD;  Location: [facility];  Service: Cardiopulmonary;  Laterality: N/A;    Family History  Problem Relation Age of Onset  . Prostate cancer Father   . Coronary artery disease Maternal Grandfather     Social History:  reports that she has been smoking Cigarettes.  She has a 7.5 pack-year smoking history. She has never used smokeless tobacco. She reports that she drinks  alcohol. She reports that she does not use illicit drugs.  Allergies:  Allergies  Allergen Reactions  . Prednisone Other (See Comments)    Hyperactivity and agitation  . Ambien [Zolpidem Tartrate] Other (See Comments)    headache  . Butrans [Buprenorphine] Other (See Comments)    Burned skin  . Nsaids Other (See Comments)    Bad for colitis  . Zofran Other (See Comments)    Headache     Prescriptions prior to admission  Medication Sig Dispense Refill  . escitalopram (LEXAPRO) 20 MG tablet Take 20 mg by mouth every morning.       Marland Kitchen LORazepam (ATIVAN) 1 MG tablet Take 0.5-1 mg by mouth 2 (two) times daily as needed for anxiety.      . Multiple Vitamin (MULITIVITAMIN WITH MINERALS) TABS Take 1 tablet by mouth every morning.       Marland Kitchen POTASSIUM CHLORIDE PO Take 4 tablets by mouth daily. Patient takes 4 OTC potassium chloride tablets that are equivalent to 20 meq as per her pharmacist.      . promethazine (PHENERGAN) 25 MG tablet Take 1 tablet (25 mg total) by mouth every 6 (six) hours as needed for nausea or vomiting.  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 She denies dyspareunia. But her husband lives in [place] and she only sees him every few week.  Blood pressure 110/68, pulse 69, temperature 98.2 F (36.8 C), temperature source Oral, resp. rate 16, last menstrual period 08/06/2013, SpO2 100.00%. Physical Exam Heart- rrr Lungs- CTAB Abd- benign Results for orders placed during the hospital encounter of 10/28/13 (from the past 24 hour(s))  PREGNANCY, URINE     Status: None   Collection Time    10/28/13  8:00 AM      Result Value Ref Range   Preg Test, Ur NEGATIVE  NEGATIVE    No results found.  Assessment/Plan: PMB with fibroid. She declines Mirena and ablation. She prefers to have her uterus, tubes, and ovaries removed. I will attempt to do this via [place] but she is aware that a laparatomy is always a possibility.  She understands the risks of surgery, including, but not to  infection, bleeding, DVTs, damage to bowel, bladder, ureters. She wishes to proceed.     Taiven Greenley C. 10/28/2013, 9:05 AM

## 2013-10-28 NOTE — Transfer of Care (Signed)
Immediate Anesthesia Transfer of Care Note  Patient: Dawn Frost  Procedure(s) Performed: Procedure(s): HYSTERECTOMY VAGINAL SALPINGO OOPHORECTOMY (Bilateral)  Patient Location: PACU  Anesthesia Type:General  Level of Consciousness: awake, alert  and oriented  Airway & Oxygen Therapy: Patient Spontanous Breathing and Patient connected to nasal cannula oxygen  Post-op Assessment: Report given to PACU RN, Post -op Vital signs reviewed and stable and Patient moving all extremities  Post vital signs: Reviewed and stable  Complications: No apparent anesthesia complications

## 2013-10-28 NOTE — Anesthesia Postprocedure Evaluation (Signed)
  Anesthesia Post-op Note  Patient: Dawn Frost  Procedure(s) Performed: Procedure(s): HYSTERECTOMY VAGINAL SALPINGO OOPHORECTOMY (Bilateral)  Patient Location: PACU and Women's Unit  Anesthesia Type:General  Level of Consciousness: awake, alert  and oriented  Airway and Oxygen Therapy: Patient Spontanous Breathing  Post-op Pain: mild  Post-op Assessment: Patient's Cardiovascular Status Stable, Respiratory Function Stable, No signs of Nausea or vomiting and Pain level controlled  Post-op Vital Signs: Reviewed and stable  Last Vitals:  Filed Vitals:   10/28/13 1415  BP: 113/71  Pulse: 87  Temp: 36.9 C  Resp: 18    Complications: No apparent anesthesia complications

## 2013-10-28 NOTE — Op Note (Signed)
10/28/2013  11:02 AM  PATIENT:  Dawn Frost  48 y.o. female  PRE-OPERATIVE DIAGNOSIS:   - Post menopausal bleeding and fibroid  POST-OPERATIVE DIAGNOSIS:   - Post menopausal bleeding and fibroid  PROCEDURE:  Procedure(s): HYSTERECTOMY VAGINAL SALPINGO OOPHORECTOMY (Bilateral)  SURGEON:  Surgeon(s) and Role:    * Emily Filbert, MD - Primary      PHYSICIAN ASSISTANT:   ASSISTANTS: Blanch Media, MD   ANESTHESIA:   general  EBL:  Total I/O In: 1000 [I.V.:1000] Out: 300 [Urine:200; Blood:100]  BLOOD ADMINISTERED:none  DRAINS: none   LOCAL MEDICATIONS USED:  MARCAINE     SPECIMEN:  Source of Specimen:  uterus, tubes, and ovaries  DISPOSITION OF SPECIMEN:  PATHOLOGY  COUNTS:  YES  TOURNIQUET:  * No tourniquets in log *  DICTATION: .Dragon Dictation  PLAN OF CARE: Admit to inpatient   PATIENT DISPOSITION:  PACU - hemodynamically stable.   Delay start of Pharmacological VTE agent (>24hrs) due to surgical blood loss or risk of bleeding: not applicable  The risks, benefits, alternatives of surgery were explained, understood, and accepted. All questions were answered and a consent form was signed. She was taken to the operating room and general anesthesia was applied. She was put in the dorsal lithotomy position. Her vagina and abdomen were prepped and draped in the usual sterile fashion. A timeout procedure was done. A bimanual exam revealed a small mildly prolapsed uterus. A Foley catheter was placed and it drained clear urine throughout the case. The cervix was grasped with a single-tooth tenaculum. A total of 110 cc of dilute Marcaine was injected in a circumferential fashion at the cervicovaginal junction. An incision was made at the site. The posterior peritoneum was entered. A long weighted speculum was placed. The anterior peritoneum was entered. A Deaver was placed anteriorly. The uterosacral ligaments were clamped, cut, and ligated. They were tagged and held.  2 Vicryl sutures used throughout this case unless otherwise specified. The small uterus was separated from its pelvic attachments using a similar clamp, cut, ligate technique. Excellent hemostasis was maintained throughout. Once the uterus was removed, the bowel was kept out of the operative site with a sponge on a stick. I was able to visualize each adnexa and grabbed them with a Babcock clamp. Using a Kelly clamp, I was able to clamp, cut, and ligate the infundibulopelvic ligaments on each side. Hemostasis was maintained. I then closed the peritoneum with a 2-0 vicryl suture in a purse string fashion. I incorporatated the tagged uterosacral ligament sutures into the vaginal cuff angles with a mattress suture. I closed the cuff with a 2-0 vicryl running, locking suture. Excellent hemostasis was noted. I packed the vagina with estrace soaked packing. She was extubated and taken to the recovery room in stable condition.

## 2013-10-29 ENCOUNTER — Encounter (HOSPITAL_COMMUNITY): Payer: Self-pay | Admitting: Obstetrics & Gynecology

## 2013-10-29 DIAGNOSIS — N8000 Endometriosis of the uterus, unspecified: Secondary | ICD-10-CM

## 2013-10-29 LAB — CBC
HCT: 32.5 % — ABNORMAL LOW (ref 36.0–46.0)
Hemoglobin: 10.8 g/dL — ABNORMAL LOW (ref 12.0–15.0)
MCH: 37.2 pg — ABNORMAL HIGH (ref 26.0–34.0)
MCHC: 33.2 g/dL (ref 30.0–36.0)
MCV: 112.1 fL — ABNORMAL HIGH (ref 78.0–100.0)
Platelets: 166 10*3/uL (ref 150–400)
RBC: 2.9 MIL/uL — ABNORMAL LOW (ref 3.87–5.11)
RDW: 13.1 % (ref 11.5–15.5)
WBC: 8.5 10*3/uL (ref 4.0–10.5)

## 2013-10-29 MED ORDER — OXYCODONE-ACETAMINOPHEN 5-325 MG PO TABS: 1.0000 | ORAL_TABLET | ORAL | Status: DC | PRN

## 2013-10-29 NOTE — Discharge Instructions (Signed)
Abdominal Hysterectomy, Care After Refer to this sheet in the next few weeks. These instructions provide you with information on caring for yourself after your procedure. Your health care provider may also give you more specific instructions. Your treatment has been planned according to current medical practices, but problems sometimes occur. Call your health care provider if you have any problems or questions after your procedure.  WHAT TO EXPECT AFTER THE PROCEDURE After your procedure, it is typical to have the following:  Pain.  Feeling tired.  Poor appetite.  Less interest in sex. HOME CARE INSTRUCTIONS  It takes 4-6 weeks to recover from this surgery. Make sure you follow all your health care provider's instructions. Home care instructions may include:  Take pain medicines only as directed by your health care provider. Do not take over-the-counter pain medicines without checking with your health care provider first.  Change your bandage as directed by your health care provider.  Return to your health care provider to have your sutures taken out.  Take showers instead of baths for 2-3 weeks. Ask your health care provider when it is safe to start showering.  Do not douche, use tampons, or have sexual intercourse for at least 6 weeks or until your health care provider says you can.   Follow your health care provider's advice about exercise, lifting, driving, and general activities.  Get plenty of rest and sleep.   Do not lift anything heavier than a gallon of milk (about 10 lb [4.5 kg]) for the first month after surgery.  You can resume your normal diet if your health care provider says it is okay.   Do not drink alcohol until your health care provider says you can.   If you are constipated, ask your health care provider if you can take a mild laxative.  Eating foods high in fiber may also help with constipation. Eat plenty of raw fruits and vegetables, whole grains, and  beans.  Drink enough fluids to keep your urine clear or pale yellow.   Try to have someone at home with you for the first 1-2 weeks to help around the house.  Keep all follow-up appointments. SEEK MEDICAL CARE IF:   You have chills or fever.  You have swelling, redness, or pain in the area of your incision that is getting worse.   You have pus coming from the incision.   You notice a bad smell coming from the incision or bandage.   Your incision breaks open.   You feel dizzy or light-headed.   You have pain or bleeding when you urinate.   You have persistent diarrhea.   You have persistent nausea and vomiting.   You have abnormal vaginal discharge.   You have a rash.   You have any type of abnormal reaction or develop an allergy to your medicine.   Your pain medicine is not helping.  SEEK IMMEDIATE MEDICAL CARE IF:   You have a fever and your symptoms suddenly get worse.  You have severe abdominal pain.  You have chest pain.  You have shortness of breath.  You faint.  You have pain, swelling, or redness of your leg.  You have heavy vaginal bleeding with blood clots. MAKE SURE YOU:  Understand these instructions.  Will watch your condition.  Will get help right away if you are not doing well or get worse. Document Released: 10/08/2004 Document Revised: 03/26/2013 Document Reviewed: 01/11/2013 Exit[employer] Patient Information 2015 [employer]. This information is not intended   to replace advice given to you by your health care provider. Make sure you discuss any questions you have with your health care provider.  

## 2013-10-29 NOTE — Progress Notes (Signed)
Pt discharged to home with husband.  Condition stable.  Pt to car via wheelchair with L. Graylon Good, NT.  No equipment for home ordered at discharge.

## 2013-10-31 ENCOUNTER — Other Ambulatory Visit: Payer: Self-pay | Admitting: *Deleted

## 2013-10-31 DIAGNOSIS — Z9889 Other specified postprocedural states: Secondary | ICD-10-CM

## 2013-10-31 MED ORDER — OXYCODONE-ACETAMINOPHEN 5-325 MG PO TABS: 1.0000 | ORAL_TABLET | ORAL | Status: DC | PRN

## 2013-10-31 NOTE — Telephone Encounter (Signed)
Per Dr Hulan Fray may give pt 10 Percocet

## 2013-11-16 ENCOUNTER — Emergency Department (HOSPITAL_BASED_OUTPATIENT_CLINIC_OR_DEPARTMENT_OTHER)
Admission: EM | Admit: 2013-11-16 | Discharge: 2013-11-16 | Disposition: A | Discharge status: Home / Self Care | Payer: BC Managed Care – PPO | Attending: Emergency Medicine | Admitting: Emergency Medicine

## 2013-11-16 ENCOUNTER — Encounter (HOSPITAL_BASED_OUTPATIENT_CLINIC_OR_DEPARTMENT_OTHER): Payer: Self-pay | Admitting: Emergency Medicine

## 2013-11-16 DIAGNOSIS — G8929 Other chronic pain: Secondary | ICD-10-CM | POA: Insufficient documentation

## 2013-11-16 DIAGNOSIS — Z87442 Personal history of urinary calculi: Secondary | ICD-10-CM | POA: Insufficient documentation

## 2013-11-16 DIAGNOSIS — I456 Pre-excitation syndrome: Secondary | ICD-10-CM | POA: Insufficient documentation

## 2013-11-16 DIAGNOSIS — R110 Nausea: Secondary | ICD-10-CM | POA: Diagnosis present

## 2013-11-16 DIAGNOSIS — Z8719 Personal history of other diseases of the digestive system: Secondary | ICD-10-CM | POA: Insufficient documentation

## 2013-11-16 DIAGNOSIS — R112 Nausea with vomiting, unspecified: Secondary | ICD-10-CM | POA: Insufficient documentation

## 2013-11-16 DIAGNOSIS — F3289 Other specified depressive episodes: Secondary | ICD-10-CM | POA: Diagnosis not present

## 2013-11-16 DIAGNOSIS — Z8639 Personal history of other endocrine, nutritional and metabolic disease: Secondary | ICD-10-CM | POA: Insufficient documentation

## 2013-11-16 DIAGNOSIS — F17200 Nicotine dependence, unspecified, uncomplicated: Secondary | ICD-10-CM | POA: Diagnosis not present

## 2013-11-16 DIAGNOSIS — Z862 Personal history of diseases of the blood and blood-forming organs and certain disorders involving the immune mechanism: Secondary | ICD-10-CM | POA: Diagnosis not present

## 2013-11-16 DIAGNOSIS — Z79899 Other long term (current) drug therapy: Secondary | ICD-10-CM | POA: Diagnosis not present

## 2013-11-16 DIAGNOSIS — F411 Generalized anxiety disorder: Secondary | ICD-10-CM | POA: Diagnosis not present

## 2013-11-16 DIAGNOSIS — F329 Major depressive disorder, single episode, unspecified: Secondary | ICD-10-CM | POA: Diagnosis not present

## 2013-11-16 LAB — CBC
HEMATOCRIT: 38.6 % (ref 36.0–46.0)
Hemoglobin: 13.2 g/dL (ref 12.0–15.0)
MCH: 37.1 pg — ABNORMAL HIGH (ref 26.0–34.0)
MCHC: 34.2 g/dL (ref 30.0–36.0)
MCV: 108.4 fL — AB (ref 78.0–100.0)
Platelets: 245 10*3/uL (ref 150–400)
RBC: 3.56 MIL/uL — AB (ref 3.87–5.11)
RDW: 11.8 % (ref 11.5–15.5)
WBC: 11.6 10*3/uL — AB (ref 4.0–10.5)

## 2013-11-16 LAB — LIPASE, BLOOD: LIPASE: 20 U/L (ref 11–59)

## 2013-11-16 LAB — COMPREHENSIVE METABOLIC PANEL
ALT: 14 U/L (ref 0–35)
ANION GAP: 13 (ref 5–15)
AST: 19 U/L (ref 0–37)
Albumin: 4.2 g/dL (ref 3.5–5.2)
Alkaline Phosphatase: 99 U/L (ref 39–117)
BILIRUBIN TOTAL: 0.2 mg/dL — AB (ref 0.3–1.2)
BUN: 16 mg/dL (ref 6–23)
CHLORIDE: 101 meq/L (ref 96–112)
CO2: 26 mEq/L (ref 19–32)
CREATININE: 0.8 mg/dL (ref 0.50–1.10)
Calcium: 9.8 mg/dL (ref 8.4–10.5)
GFR calc Af Amer: >90 mL/min (ref >90)
GFR calc non Af Amer: 86 mL/min — ABNORMAL LOW (ref >90)
Glucose, Bld: 85 mg/dL (ref 70–99)
Potassium: 4.6 mEq/L (ref 3.7–5.3)
Sodium: 140 mEq/L (ref 137–147)
Total Protein: 7.1 g/dL (ref 6.0–8.3)

## 2013-11-16 LAB — URINALYSIS, ROUTINE W REFLEX MICROSCOPIC
BILIRUBIN URINE: NEGATIVE
Glucose, UA: NEGATIVE mg/dL
HGB URINE DIPSTICK: NEGATIVE
KETONES UR: NEGATIVE mg/dL
NITRITE: NEGATIVE
Protein, ur: NEGATIVE mg/dL
Specific Gravity, Urine: 1.008 (ref 1.005–1.030)
Urobilinogen, UA: 0.2 mg/dL (ref 0.0–1.0)
pH: 6 (ref 5.0–8.0)

## 2013-11-16 LAB — URINE MICROSCOPIC-ADD ON

## 2013-11-16 MED ORDER — PROMETHAZINE HCL 25 MG PO TABS: 25.0000 mg | ORAL_TABLET | Freq: Four times a day (QID) | ORAL | Status: DC | PRN

## 2013-11-16 MED ORDER — SODIUM CHLORIDE 0.9 % IV BOLUS (SEPSIS)
1000.0000 mL | Freq: Once | INTRAVENOUS | Status: AC
  Administered 2013-11-16: 1000 mL via INTRAVENOUS

## 2013-11-16 MED ORDER — PROMETHAZINE HCL 25 MG/ML IJ SOLN
25.0000 mg | Freq: Once | INTRAMUSCULAR | Status: AC
  Administered 2013-11-16: 25 mg via INTRAMUSCULAR
  Filled 2013-11-16: qty 1

## 2013-11-16 NOTE — ED Provider Notes (Signed)
CSN: [id number]     Arrival date & time 11/16/13  7829 History   First MD Initiated Contact with Patient 11/16/13 [phone]     Chief Complaint  Patient presents with  . Nausea     (Consider location/radiation/quality/duration/timing/severity/associated sxs/prior Treatment) HPI Pt presents with c/o nausea and vomiting which began last night.  She also c/o fever of 101 at home this morning with chills.  She states she took tylenol for the fever which did improve some of her symptoms.  No abdominal pain.  Sevearl weeks ago she had hysterectomy and she states this helped a lot with her chronic pain issues.  Has not been able to keep down liquids this morning.  There are no other associated systemic symptoms, there are no other alleviating or modifying factors.   Past Medical History  Diagnosis Date  . Collagenous colitis   . WPW (Wolff-Parkinson-White syndrome)   . Addison disease   . Thyroid disease   . Clostridium difficile diarrhea   . Anxiety and depression   . Chronic diarrhea   . Chronic back pain   . Drug-seeking behavior   . Irritable bowel syndrome (IBS)   . Anemia   . Pancreatitis   . Kidney stone   . Kidney stone   . Depression   . Anxiety   . Vaginal delivery 3086,5784, 1998   Past Surgical History  Procedure Laterality Date  . Cholecystectomy    . Tubal ligation    . Lithotripsy    . Sphincterotomy    . Cardiac electrophysiology mapping and ablation      for WPW  . Video bronchoscopy  08/31/2011    Procedure: VIDEO BRONCHOSCOPY WITH FLUORO;  Surgeon: Elsie Stain, MD;  Location: [facility];  Service: Cardiopulmonary;  Laterality: N/A;  . Vaginal hysterectomy  10/28/2013    Procedure: HYSTERECTOMY VAGINAL;  Surgeon: Emily Filbert, MD;  Location: [facility];  Service: Gynecology;;  . Salpingoophorectomy Bilateral 10/28/2013    Procedure: SALPINGO OOPHORECTOMY;  Surgeon: Emily Filbert, MD;  Location: [facility];  Service: Gynecology;  Laterality: Bilateral;  . Abdominal  hysterectomy  10/28/2013   Family History  Problem Relation Age of Onset  . Prostate cancer Father   . Coronary artery disease Maternal Grandfather    History  Substance Use Topics  . Smoking status: Current Every Day Smoker -- 0.25 packs/day for 30 years    Types: Cigarettes  . Smokeless tobacco: Never Used  . Alcohol Use: Yes     Comment: seldom   OB History   Grav Para Term Preterm Abortions TAB SAB Ect Mult Living   3 3 3       3      Review of Systems ROS reviewed and all otherwise negative except for mentioned in HPI    Allergies  Prednisone; Ambien; Butrans; Nsaids; and Zofran  Home Medications   Prior to Admission medications   Medication Sig Start Date End Date Taking? Authorizing Provider  escitalopram (LEXAPRO) 20 MG tablet Take 20 mg by mouth every morning.     Historical Provider, MD  LORazepam (ATIVAN) 1 MG tablet Take 0.5-1 mg by mouth 2 (two) times daily as needed for anxiety.    Historical Provider, MD  Multiple Vitamin (MULITIVITAMIN WITH MINERALS) TABS Take 1 tablet by mouth every morning.     Historical Provider, MD  oxyCODONE-acetaminophen (PERCOCET/ROXICET) 5-325 MG per tablet Take 1-2 tablets by mouth every 4 (four) hours as needed for severe pain (moderate to severe pain (  when tolerating fluids)). 10/31/13   Emily Filbert, MD  POTASSIUM CHLORIDE PO Take 4 tablets by mouth daily. Patient takes 4 OTC potassium chloride tablets that are equivalent to 20 meq as per her pharmacist.    Historical Provider, MD  promethazine (PHENERGAN) 25 MG tablet Take 1 tablet (25 mg total) by mouth every 6 (six) hours as needed for nausea or vomiting. 09/26/13   Linus Mako, PA-C  promethazine (PHENERGAN) 25 MG tablet Take 1 tablet (25 mg total) by mouth every 6 (six) hours as needed for nausea or vomiting. 11/16/13   Threasa Beards, MD   BP 124/85  Pulse 81  Temp(Src) 98.1 F (36.7 C) (Oral)  Resp 18  Ht 5\' 4"  (1.626 m)  Wt 125 lb (56.7 kg)  BMI 21.45 kg/m2  SpO2  100% Vitals reviewed Physical Exam Physical Examination: General appearance - alert, well appearing, and in no distress Mental status - alert, oriented to person, place, and time Eyes - no conjunctival injection, no scleral icterus Mouth - mucous membranes moist, pharynx normal without lesions Chest - clear to auscultation, no wheezes, rales or rhonchi, symmetric air entry Heart - normal rate, regular rhythm, normal S1, S2, no murmurs, rubs, clicks or gallops Abdomen - soft, nontender, nondistended, no masses or organomegaly, nabs Extremities - peripheral pulses normal, no pedal edema, no clubbing or cyanosis Skin - normal coloration and turgor, no rashes  ED Course  Procedures (including critical care time)  11:42 AM pt feels improved after phenergan and is sipping ginger ale without difficulty.  Labs Review Labs Reviewed  CBC - Abnormal; Notable for the following:    WBC 11.6 (*)    RBC 3.56 (*)    MCV 108.4 (*)    MCH 37.1 (*)    All other components within normal limits  COMPREHENSIVE METABOLIC PANEL - Abnormal; Notable for the following:    Total Bilirubin 0.2 (*)    GFR calc non Af Amer 86 (*)    All other components within normal limits  URINALYSIS, ROUTINE W REFLEX MICROSCOPIC - Abnormal; Notable for the following:    Leukocytes, UA TRACE (*)    All other components within normal limits  URINE MICROSCOPIC-ADD ON - Abnormal; Notable for the following:    Squamous Epithelial / LPF MANY (*)    Bacteria, UA MANY (*)    All other components within normal limits  URINE CULTURE  LIPASE, BLOOD    Imaging Review No results found.   EKG Interpretation None      MDM   Final diagnoses:  Non-intractable vomiting with nausea, vomiting of unspecified type    Pt presenting with c/o nausea and vomiting.  Abdominal exam is benign.  Labs are reassuring and urine is not c/w UTI-urine culture pending.  Pt feels improved after IV fluids and phenergan and is tolerating po  challenge in the ED.  Discharged with strict return precautions.  Pt agreeable with plan.  Nursing notes including past medical history and social history reviewed and considered in documentation Prior records reviewed and considered during this visit     Threasa Beards, MD 11/17/13 [phone]

## 2013-11-16 NOTE — ED Notes (Signed)
Sipping ginger ale, no nausea

## 2013-11-16 NOTE — ED Notes (Signed)
Patient c/o nausea and vomiting since yesterday. States she had a fever of 101 this morning around 4 am and took tylenol, fever reduced

## 2013-11-16 NOTE — Discharge Instructions (Signed)
Return to the ED with any concerns including vomiting and not able to keep down liquids, vomiting blood, fainting, decreased level of alertness/lethargy, or any other alarming symptoms

## 2013-11-18 LAB — URINE CULTURE: Colony Count: 50000

## 2013-11-23 NOTE — Discharge Summary (Signed)
Physician Discharge Summary  Patient ID: Dawn Frost MRN: [id number] DOB/AGE: [birth year] 48 y.o.  Admit date: 10/28/2013 Discharge date: 11/23/2013  Admission Diagnoses: post menopausal bleeding and fibroid and chronic pelvic pain  Discharge Diagnoses: same Active Problems:   Post-operative state   Discharged Condition: good  Hospital Course: She underwent an uncomplicated TVH/BS. On POD #1 she was voiding, tolerating po well, and ambulating without difficulty. She voiced her readiness to go home.  Consults: None  Significant Diagnostic Studies: labs: Post op HBG was 10  Treatments: surgery: as above  Discharge Exam: Blood pressure 89/59, pulse 101, temperature 98.1 F (36.7 C), temperature source Oral, resp. rate 18, height 5\' 4"  (1.626 m), weight 53.071 kg (117 lb), last menstrual period 08/06/2013, SpO2 99.00%. General appearance: alert Resp: clear to auscultation bilaterally Cardio: regular rate and rhythm, S1, S2 normal, no murmur, click, rub or gallop GI: soft, non-tender; bowel sounds normal; no masses,  no organomegaly  Disposition: 01-Home or Self Care     Medication List         escitalopram 20 MG tablet  Commonly known as:  LEXAPRO  Take 20 mg by mouth every morning.     LORazepam 1 MG tablet  Commonly known as:  ATIVAN  Take 0.5-1 mg by mouth 2 (two) times daily as needed for anxiety.     multivitamin with minerals Tabs tablet  Take 1 tablet by mouth every morning.     POTASSIUM CHLORIDE PO  Take 4 tablets by mouth daily. Patient takes 4 OTC potassium chloride tablets that are equivalent to 20 meq as per her pharmacist.     promethazine 25 MG tablet  Commonly known as:  PHENERGAN  Take 1 tablet (25 mg total) by mouth every 6 (six) hours as needed for nausea or vomiting.           Follow-up Information   Follow up with Gussie Towson C., MD. Schedule an appointment as soon as possible for a visit in 6 weeks.   Specialty:  Obstetrics and  Gynecology   Contact information:   [place] [address] [phone]       Signed: Emily Filbert 11/23/2013, 9:40 AM

## 2013-12-11 ENCOUNTER — Encounter: Payer: BC Managed Care – PPO | Admitting: Obstetrics & Gynecology

## 2013-12-12 ENCOUNTER — Encounter: Payer: BC Managed Care – PPO | Admitting: Obstetrics & Gynecology

## 2013-12-13 ENCOUNTER — Emergency Department (HOSPITAL_COMMUNITY)
Admission: EM | Admit: 2013-12-13 | Discharge: 2013-12-13 | Disposition: A | Discharge status: Home / Self Care | Payer: BC Managed Care – PPO | Attending: Emergency Medicine | Admitting: Emergency Medicine

## 2013-12-13 ENCOUNTER — Encounter (HOSPITAL_COMMUNITY): Payer: Self-pay | Admitting: Emergency Medicine

## 2013-12-13 DIAGNOSIS — F17200 Nicotine dependence, unspecified, uncomplicated: Secondary | ICD-10-CM | POA: Diagnosis not present

## 2013-12-13 DIAGNOSIS — Z8679 Personal history of other diseases of the circulatory system: Secondary | ICD-10-CM | POA: Insufficient documentation

## 2013-12-13 DIAGNOSIS — F329 Major depressive disorder, single episode, unspecified: Secondary | ICD-10-CM | POA: Insufficient documentation

## 2013-12-13 DIAGNOSIS — Z862 Personal history of diseases of the blood and blood-forming organs and certain disorders involving the immune mechanism: Secondary | ICD-10-CM | POA: Diagnosis not present

## 2013-12-13 DIAGNOSIS — F3289 Other specified depressive episodes: Secondary | ICD-10-CM | POA: Insufficient documentation

## 2013-12-13 DIAGNOSIS — Z3202 Encounter for pregnancy test, result negative: Secondary | ICD-10-CM | POA: Diagnosis not present

## 2013-12-13 DIAGNOSIS — G8929 Other chronic pain: Secondary | ICD-10-CM | POA: Insufficient documentation

## 2013-12-13 DIAGNOSIS — Z90710 Acquired absence of both cervix and uterus: Secondary | ICD-10-CM | POA: Diagnosis not present

## 2013-12-13 DIAGNOSIS — Z87442 Personal history of urinary calculi: Secondary | ICD-10-CM | POA: Insufficient documentation

## 2013-12-13 DIAGNOSIS — Z87448 Personal history of other diseases of urinary system: Secondary | ICD-10-CM | POA: Insufficient documentation

## 2013-12-13 DIAGNOSIS — Z8619 Personal history of other infectious and parasitic diseases: Secondary | ICD-10-CM | POA: Insufficient documentation

## 2013-12-13 DIAGNOSIS — Z79899 Other long term (current) drug therapy: Secondary | ICD-10-CM | POA: Diagnosis not present

## 2013-12-13 DIAGNOSIS — F411 Generalized anxiety disorder: Secondary | ICD-10-CM | POA: Diagnosis not present

## 2013-12-13 DIAGNOSIS — R109 Unspecified abdominal pain: Secondary | ICD-10-CM | POA: Diagnosis not present

## 2013-12-13 DIAGNOSIS — K589 Irritable bowel syndrome without diarrhea: Secondary | ICD-10-CM | POA: Diagnosis not present

## 2013-12-13 DIAGNOSIS — Z8639 Personal history of other endocrine, nutritional and metabolic disease: Secondary | ICD-10-CM | POA: Insufficient documentation

## 2013-12-13 LAB — HEPATIC FUNCTION PANEL
ALK PHOS: 85 U/L (ref 39–117)
ALT: 11 U/L (ref 0–35)
AST: 15 U/L (ref 0–37)
Albumin: 3.3 g/dL — ABNORMAL LOW (ref 3.5–5.2)
Total Bilirubin: <0.2 mg/dL — ABNORMAL LOW (ref 0.3–1.2)
Total Protein: 6.3 g/dL (ref 6.0–8.3)

## 2013-12-13 LAB — URINE MICROSCOPIC-ADD ON

## 2013-12-13 LAB — I-STAT CHEM 8, ED
BUN: 13 mg/dL (ref 6–23)
CALCIUM ION: 1.12 mmol/L (ref 1.12–1.23)
Chloride: 109 mEq/L (ref 96–112)
Creatinine, Ser: 0.7 mg/dL (ref 0.50–1.10)
Glucose, Bld: 85 mg/dL (ref 70–99)
HEMATOCRIT: 36 % (ref 36.0–46.0)
Hemoglobin: 12.2 g/dL (ref 12.0–15.0)
Potassium: 3.4 mEq/L — ABNORMAL LOW (ref 3.7–5.3)
Sodium: 138 mEq/L (ref 137–147)
TCO2: 22 mmol/L (ref 0–100)

## 2013-12-13 LAB — URINALYSIS, ROUTINE W REFLEX MICROSCOPIC
Bilirubin Urine: NEGATIVE
Glucose, UA: NEGATIVE mg/dL
Ketones, ur: NEGATIVE mg/dL
NITRITE: NEGATIVE
PROTEIN: NEGATIVE mg/dL
SPECIFIC GRAVITY, URINE: 1.006 (ref 1.005–1.030)
UROBILINOGEN UA: 0.2 mg/dL (ref 0.0–1.0)
pH: 6 (ref 5.0–8.0)

## 2013-12-13 LAB — PREGNANCY, URINE: PREG TEST UR: NEGATIVE

## 2013-12-13 LAB — LIPASE, BLOOD: LIPASE: 28 U/L (ref 11–59)

## 2013-12-13 MED ORDER — PROMETHAZINE HCL 25 MG PO TABS: 25.0000 mg | ORAL_TABLET | Freq: Four times a day (QID) | ORAL | Status: DC | PRN

## 2013-12-13 MED ORDER — HYDROMORPHONE HCL PF 1 MG/ML IJ SOLN
1.0000 mg | Freq: Once | INTRAMUSCULAR | Status: AC
  Administered 2013-12-13: 1 mg via INTRAVENOUS
  Filled 2013-12-13: qty 1

## 2013-12-13 MED ORDER — CEPHALEXIN 500 MG PO CAPS: 500.0000 mg | ORAL_CAPSULE | Freq: Four times a day (QID) | ORAL | Status: DC

## 2013-12-13 MED ORDER — PROMETHAZINE HCL 25 MG/ML IJ SOLN
12.5000 mg | Freq: Once | INTRAMUSCULAR | Status: AC
  Administered 2013-12-13: 12.5 mg via INTRAVENOUS
  Filled 2013-12-13: qty 1

## 2013-12-13 MED ORDER — SODIUM CHLORIDE 0.9 % IV BOLUS (SEPSIS)
1000.0000 mL | INTRAVENOUS | Status: AC
  Administered 2013-12-13: 1000 mL via INTRAVENOUS

## 2013-12-13 MED ORDER — OXYCODONE-ACETAMINOPHEN 5-325 MG PO TABS
2.0000 | ORAL_TABLET | Freq: Once | ORAL | Status: AC
  Administered 2013-12-13: 2 via ORAL
  Filled 2013-12-13: qty 2

## 2013-12-13 MED ORDER — OXYCODONE-ACETAMINOPHEN 5-325 MG PO TABS: 1.0000 | ORAL_TABLET | Freq: Four times a day (QID) | ORAL | Status: DC | PRN

## 2013-12-13 NOTE — ED Notes (Signed)
Pt complains of left flank pain since 10pm, hx of kidney stones and the pain feels the same, urinary frequency and urgency

## 2013-12-13 NOTE — ED Provider Notes (Signed)
CSN: [id number]     Arrival date & time 12/13/13  0334 History   First MD Initiated Contact with Patient 12/13/13 [phone]     Chief Complaint  Patient presents with  . Flank Pain     (Consider location/radiation/quality/duration/timing/severity/associated sxs/prior Treatment) Patient is a 48 y.o. female presenting with flank pain. The history is provided by the patient.  Flank Pain This is a recurrent problem. The current episode started 6 to 12 hours ago. The problem occurs constantly. The problem has not changed since onset.Associated symptoms include abdominal pain (left flank pain). Pertinent negatives include no chest pain, no headaches and no shortness of breath. Nothing aggravates the symptoms. Nothing relieves the symptoms. Treatments tried: percocet. The treatment provided mild relief.    Past Medical History  Diagnosis Date  . Collagenous colitis   . WPW (Wolff-Parkinson-White syndrome)   . Addison disease   . Thyroid disease   . Clostridium difficile diarrhea   . Anxiety and depression   . Chronic diarrhea   . Chronic back pain   . Drug-seeking behavior   . Irritable bowel syndrome (IBS)   . Anemia   . Pancreatitis   . Kidney stone   . Kidney stone   . Depression   . Anxiety   . Vaginal delivery [id number], 1998   Past Surgical History  Procedure Laterality Date  . Cholecystectomy    . Tubal ligation    . Lithotripsy    . Sphincterotomy    . Cardiac electrophysiology mapping and ablation      for WPW  . Video bronchoscopy  08/31/2011    Procedure: VIDEO BRONCHOSCOPY WITH FLUORO;  Surgeon: Elsie Stain, MD;  Location: [facility];  Service: Cardiopulmonary;  Laterality: N/A;  . Vaginal hysterectomy  10/28/2013    Procedure: HYSTERECTOMY VAGINAL;  Surgeon: Emily Filbert, MD;  Location: [facility];  Service: Gynecology;;  . Salpingoophorectomy Bilateral 10/28/2013    Procedure: SALPINGO OOPHORECTOMY;  Surgeon: Emily Filbert, MD;  Location: [facility];  Service: Gynecology;   Laterality: Bilateral;  . Abdominal hysterectomy  10/28/2013   Family History  Problem Relation Age of Onset  . Prostate cancer Father   . Coronary artery disease Maternal Grandfather    History  Substance Use Topics  . Smoking status: Current Every Day Smoker -- 0.25 packs/day for 30 years    Types: Cigarettes  . Smokeless tobacco: Never Used  . Alcohol Use: Yes     Comment: seldom   OB History   Grav Para Term Preterm Abortions TAB SAB Ect Mult Living   3 3 3       3      Review of Systems  Constitutional: Negative for fever and fatigue.  HENT: Negative for congestion and drooling.   Eyes: Negative for pain.  Respiratory: Negative for cough and shortness of breath.   Cardiovascular: Negative for chest pain.  Gastrointestinal: Positive for nausea, vomiting (once) and abdominal pain (left flank pain). Negative for diarrhea.  Genitourinary: Positive for frequency and flank pain. Negative for dysuria and hematuria.  Musculoskeletal: Negative for back pain, gait problem and neck pain.  Skin: Negative for color change.  Neurological: Negative for dizziness and headaches.  Hematological: Negative for adenopathy.  Psychiatric/Behavioral: Negative for behavioral problems.  All other systems reviewed and are negative.     Allergies  Prednisone; Ambien; Butrans; Nsaids; and Zofran  Home Medications   Prior to Admission medications   Medication Sig Start Date End Date Taking? Authorizing Provider  escitalopram (LEXAPRO) 20 MG tablet Take 20 mg by mouth every morning.    Yes Historical Provider, MD  lisdexamfetamine (VYVANSE) 30 MG capsule Take 1 mg by mouth daily. 12/14/13 01/13/14 Yes Historical Provider, MD  LORazepam (ATIVAN) 1 MG tablet Take 0.5-1 mg by mouth 2 (two) times daily as needed for anxiety.   Yes Historical Provider, MD  Multiple Vitamin (MULITIVITAMIN WITH MINERALS) TABS Take 1 tablet by mouth every morning.    Yes Historical Provider, MD  POTASSIUM CHLORIDE PO  Take 4 tablets by mouth daily. Patient takes 4 OTC potassium chloride tablets that are equivalent to 20 meq as per her pharmacist.   Yes Historical Provider, MD  promethazine (PHENERGAN) 25 MG tablet Take 1 tablet (25 mg total) by mouth every 6 (six) hours as needed for nausea or vomiting. 11/16/13  Yes Threasa Beards, MD   BP 130/80  Pulse 113  Temp(Src) 98.2 F (36.8 C) (Oral)  Resp 18  SpO2 97% Physical Exam  Nursing note and vitals reviewed. Constitutional: She is oriented to person, place, and time. She appears well-developed and well-nourished.  HENT:  Head: Normocephalic.  Mouth/Throat: No oropharyngeal exudate.  Eyes: Conjunctivae and EOM are normal. Pupils are equal, round, and reactive to light.  Neck: Normal range of motion. Neck supple.  Cardiovascular: Normal rate, regular rhythm, normal heart sounds and intact distal pulses.  Exam reveals no gallop and no friction rub.   No murmur heard. Pulmonary/Chest: Effort normal and breath sounds normal. No respiratory distress. She has no wheezes.  Abdominal: Soft. Bowel sounds are normal. There is no tenderness. There is no rebound and no guarding.  Musculoskeletal: Normal range of motion. She exhibits no edema and no tenderness.  Mild left CVA ttp.   Neurological: She is alert and oriented to person, place, and time.  Skin: Skin is warm and dry.  Psychiatric: She has a normal mood and affect. Her behavior is normal.    ED Course  Procedures (including critical care time) Labs Review Labs Reviewed  URINALYSIS, ROUTINE W REFLEX MICROSCOPIC - Abnormal; Notable for the following:    APPearance CLOUDY (*)    Hgb urine dipstick LARGE (*)    Leukocytes, UA TRACE (*)    All other components within normal limits  URINE MICROSCOPIC-ADD ON - Abnormal; Notable for the following:    Squamous Epithelial / LPF FEW (*)    Bacteria, UA FEW (*)    All other components within normal limits  HEPATIC FUNCTION PANEL - Abnormal; Notable for  the following:    Albumin 3.3 (*)    Total Bilirubin <0.2 (*)    All other components within normal limits  I-STAT CHEM 8, ED - Abnormal; Notable for the following:    Potassium 3.4 (*)    All other components within normal limits  PREGNANCY, URINE  LIPASE, BLOOD    Imaging Review No results found.   EKG Interpretation None      MDM   Final diagnoses:  Left flank pain    7:15 AM 48 y.o. female w hx of kidney stones pw sudden onset left flank pain at 10pm last night. Sx c/w previous episodes of kidney stones. Pt AFVSS here.  Doubt imaging needed. Screening labs and pain/nausea control.   10:09 AM: Pain controlled. Pt continues to appear well. Doubt UTI, but pt w/ some dysuria so will be conservative and tx given small leuk/bact. Will send cx.  I have discussed the diagnosis/risks/treatment options with the patient and  believe the pt to be eligible for discharge home to follow-up with her urologist. We also discussed returning to the ED immediately if new or worsening sx occur. We discussed the sx which are most concerning (e.g., fever, worsening pain) that necessitate immediate return. Medications administered to the patient during their visit and any new prescriptions provided to the patient are listed below.  Medications given during this visit Medications  oxyCODONE-acetaminophen (PERCOCET/ROXICET) 5-325 MG per tablet 2 tablet (2 tablets Oral Given 12/13/13 0536)  sodium chloride 0.9 % bolus 1,000 mL (1,000 mLs Intravenous New Bag/Given 12/13/13 0801)  HYDROmorphone (DILAUDID) injection 1 mg (1 mg Intravenous Given 12/13/13 0801)  promethazine (PHENERGAN) injection 12.5 mg (12.5 mg Intravenous Given 12/13/13 0810)  promethazine (PHENERGAN) injection 12.5 mg (12.5 mg Intravenous Given 12/13/13 0933)  HYDROmorphone (DILAUDID) injection 1 mg (1 mg Intravenous Given 12/13/13 0920)    New Prescriptions   CEPHALEXIN (KEFLEX) 500 MG CAPSULE    Take 1 capsule (500 mg total) by mouth 4  (four) times daily.   OXYCODONE-ACETAMINOPHEN (PERCOCET) 5-325 MG PER TABLET    Take 1-2 tablets by mouth every 6 (six) hours as needed for moderate pain.   PROMETHAZINE (PHENERGAN) 25 MG TABLET    Take 1 tablet (25 mg total) by mouth every 6 (six) hours as needed for nausea or vomiting.     Pamella Pert, MD 12/13/13 1010

## 2013-12-14 LAB — URINE CULTURE: Colony Count: 5000

## 2013-12-17 ENCOUNTER — Encounter: Payer: Self-pay | Admitting: Obstetrics & Gynecology

## 2013-12-17 ENCOUNTER — Ambulatory Visit (INDEPENDENT_AMBULATORY_CARE_PROVIDER_SITE_OTHER): Payer: BC Managed Care – PPO | Admitting: Obstetrics & Gynecology

## 2013-12-17 VITALS — BP 145/92 | HR 91 | Resp 16 | Ht 64.0 in | Wt 121.0 lb

## 2013-12-17 DIAGNOSIS — R519 Headache, unspecified: Secondary | ICD-10-CM

## 2013-12-17 DIAGNOSIS — R51 Headache: Secondary | ICD-10-CM | POA: Diagnosis not present

## 2013-12-17 DIAGNOSIS — Z09 Encounter for follow-up examination after completed treatment for conditions other than malignant neoplasm: Secondary | ICD-10-CM

## 2013-12-17 MED ORDER — BUTALBITAL-APAP-CAFFEINE 50-325-40 MG PO TABS: 1.0000 | ORAL_TABLET | ORAL | Status: DC | PRN

## 2013-12-17 MED ORDER — BUTALBITAL-APAP-CAFFEINE 50-500-40 MG PO TABS: 1.0000 | ORAL_TABLET | ORAL | Status: DC | PRN

## 2013-12-17 MED ORDER — SUMATRIPTAN SUCCINATE 100 MG PO TABS: 100.0000 mg | ORAL_TABLET | Freq: Once | ORAL | Status: DC | PRN

## 2013-12-17 NOTE — Addendum Note (Signed)
Addended by: Gretchen Short on: 12/17/2013 04:17 PM   Modules accepted: Orders

## 2013-12-17 NOTE — Addendum Note (Signed)
Addended by: Gretchen Short on: 12/17/2013 04:10 PM   Modules accepted: Orders, Medications

## 2013-12-17 NOTE — Progress Notes (Signed)
   Subjective:    Patient ID: Dawn Frost, female    DOB: [birth year], 48 y.o.   MRN: [id number]  HPI  This 48 yo lady is now 7 weeks post op s/p TVH/BSO for fibroids. She is doing very well. She reports that her pre op hot flashes have resolved, that her colitis is actually much better. She does complain of a return of tension headaches and migraines and is requesting fiorcet and imitrex. She has not had sex since surgery. She reports normal bladder function.   Review of Systems     Objective:   Physical Exam Well-healed vaginal cuff Normal bimanual exam       Assessment & Plan:  Post op doing well- return of headaches- I have prescribed fiorcet and imitrex and have recommended an appt with Monna Fam, NP

## 2013-12-19 ENCOUNTER — Encounter: Payer: BC Managed Care – PPO | Admitting: Obstetrics & Gynecology

## 2014-01-07 ENCOUNTER — Encounter: Payer: Self-pay | Admitting: Nurse Practitioner

## 2014-01-07 ENCOUNTER — Ambulatory Visit (INDEPENDENT_AMBULATORY_CARE_PROVIDER_SITE_OTHER): Payer: BLUE CROSS/BLUE SHIELD | Admitting: Nurse Practitioner

## 2014-01-07 VITALS — BP 152/95 | HR 113 | Resp 16 | Ht 63.0 in | Wt 126.0 lb

## 2014-01-07 DIAGNOSIS — G4700 Insomnia, unspecified: Secondary | ICD-10-CM | POA: Diagnosis not present

## 2014-01-07 DIAGNOSIS — F410 Panic disorder [episodic paroxysmal anxiety] without agoraphobia: Secondary | ICD-10-CM

## 2014-01-07 DIAGNOSIS — G43119 Migraine with aura, intractable, without status migrainosus: Secondary | ICD-10-CM | POA: Diagnosis not present

## 2014-01-07 DIAGNOSIS — R519 Headache, unspecified: Secondary | ICD-10-CM

## 2014-01-07 DIAGNOSIS — R51 Headache: Secondary | ICD-10-CM | POA: Diagnosis not present

## 2014-01-07 DIAGNOSIS — G43009 Migraine without aura, not intractable, without status migrainosus: Secondary | ICD-10-CM | POA: Insufficient documentation

## 2014-01-07 DIAGNOSIS — F411 Generalized anxiety disorder: Secondary | ICD-10-CM

## 2014-01-07 MED ORDER — BUTALBITAL-APAP-CAFFEINE 50-325-40 MG PO TABS: 1.0000 | ORAL_TABLET | ORAL | Status: DC | PRN

## 2014-01-07 MED ORDER — ZOLPIDEM TARTRATE 10 MG PO TABS: 10.0000 mg | ORAL_TABLET | Freq: Every evening | ORAL | Status: DC | PRN

## 2014-01-07 MED ORDER — PROMETHAZINE HCL 25 MG PO TABS: 25.0000 mg | ORAL_TABLET | Freq: Four times a day (QID) | ORAL | Status: DC | PRN

## 2014-01-07 NOTE — Progress Notes (Signed)
Diagnosis: Daily headache since 7/15, Anxiety, Insomnia, Colitis, and multiple other physical issues    History: Dawn Frost 48 y.o. [id number] presents to [facility] today for migraine consultation. She has been having daily migraine since her hysterectomy in July. She had had her menopause 2 years prior to that and has been on no HRT. She has a long and complex medical history. She has been on migraine prevention in the past but nothing seems to help. She feels now that what seems to work for her is [employer], political party and she feels she is not over using that medication. Her migraines last at least 4 hours. She will be moving to [place] in May to join her husband after her son graduates from [employer]. She is on Disability for her Crohn Disease  Location: [facility]  Number of Headache days/month: daily since July 2015 hysterectomy  Severe: 3 Moderate: 15 Mild:8  Current Outpatient Prescriptions on File Prior to Visit  Medication Sig Dispense Refill  . escitalopram (LEXAPRO) 20 MG tablet Take 20 mg by mouth every morning.       . loperamide (IMODIUM) 2 MG capsule Take 2 mg by mouth.      Marland Kitchen LORazepam (ATIVAN) 1 MG tablet Take 0.5-1 mg by mouth 2 (two) times daily as needed for anxiety.      . Multiple Vitamin (MULITIVITAMIN WITH MINERALS) TABS Take 1 tablet by mouth every morning.       . potassium chloride (KLOR-CON M10) 10 MEQ tablet Take by mouth.       No current facility-administered medications on file prior to visit.    Acute prevention: NSAIDS, Imitrex, Midrin, Inderal, Topamax, Fioricet, Zoloft, Lexapro, Pain medications  Past Medical History  Diagnosis Date  . Collagenous colitis   . WPW (Wolff-Parkinson-White syndrome)   . Addison disease   . Thyroid disease   . Clostridium difficile diarrhea   . Anxiety and depression   . Chronic diarrhea   . Chronic back pain   . Drug-seeking behavior   . Irritable bowel syndrome (IBS)   . Anemia   . Pancreatitis   . Kidney  stone   . Kidney stone   . Depression   . Anxiety   . Vaginal delivery [id number], 1998   Past Surgical History  Procedure Laterality Date  . Cholecystectomy    . Tubal ligation    . Lithotripsy    . Sphincterotomy    . Cardiac electrophysiology mapping and ablation      for WPW  . Video bronchoscopy  08/31/2011    Procedure: VIDEO BRONCHOSCOPY WITH FLUORO;  Surgeon: Elsie Stain, MD;  Location: [facility];  Service: Cardiopulmonary;  Laterality: N/A;  . Vaginal hysterectomy  10/28/2013    Procedure: HYSTERECTOMY VAGINAL;  Surgeon: Emily Filbert, MD;  Location: [facility];  Service: Gynecology;;  . Salpingoophorectomy Bilateral 10/28/2013    Procedure: SALPINGO OOPHORECTOMY;  Surgeon: Emily Filbert, MD;  Location: [facility];  Service: Gynecology;  Laterality: Bilateral;  . Abdominal hysterectomy  10/28/2013   Family History  Problem Relation Age of Onset  . Prostate cancer Father   . Coronary artery disease Maternal Grandfather    Social History:  reports that she has been smoking Cigarettes.  She has a 7.5 pack-year smoking history. She has never used smokeless tobacco. She reports that she drinks alcohol. She reports that she uses illicit drugs (Barbituates). Allergies:  Allergies  Allergen Reactions  . Prednisone Other (See Comments)    Hyperactivity and agitation  .  Ambien [Zolpidem Tartrate] Other (See Comments)    headache  . Butrans [Buprenorphine] Other (See Comments)    Burned skin  . Nsaids Other (See Comments)    Bad for colitis  . Zofran Other (See Comments)    Headache     Triggers: stress  Birth control: hysterectomy  ROS: positive for daily headache, see above list  Exam: Well developed, well nourished caucasian female  General: NAD HEENT:Negative Cardiac:RRR Lungs:Clear Neuro:Negative M/S: shoulders are very tight Skin:Addisons  Impression:migraine - common Insomnia  Plan: Due to extensive medical issues we will leave her lexapro dose alone for  anxiety. She has tried and not done well with several other headache preventatives and considering this and her health issues she has chosen Botox for migraine prevention. Her migraines last up to 4 hours each and she should get a good benefit from this. For sleep she will trial Ambien. She has asked for refills on phenergan and Esgic. We will see her back when Botox is approved  Time Spent: 45 minutes

## 2014-01-07 NOTE — Patient Instructions (Signed)

## 2014-01-21 DIAGNOSIS — R51 Headache: Secondary | ICD-10-CM | POA: Insufficient documentation

## 2014-01-21 DIAGNOSIS — Z8739 Personal history of other diseases of the musculoskeletal system and connective tissue: Secondary | ICD-10-CM | POA: Diagnosis not present

## 2014-01-21 DIAGNOSIS — G8929 Other chronic pain: Secondary | ICD-10-CM | POA: Diagnosis not present

## 2014-01-21 DIAGNOSIS — Z862 Personal history of diseases of the blood and blood-forming organs and certain disorders involving the immune mechanism: Secondary | ICD-10-CM | POA: Insufficient documentation

## 2014-01-21 DIAGNOSIS — R112 Nausea with vomiting, unspecified: Secondary | ICD-10-CM | POA: Diagnosis not present

## 2014-01-21 DIAGNOSIS — K589 Irritable bowel syndrome without diarrhea: Secondary | ICD-10-CM | POA: Insufficient documentation

## 2014-01-21 DIAGNOSIS — Z8679 Personal history of other diseases of the circulatory system: Secondary | ICD-10-CM | POA: Insufficient documentation

## 2014-01-21 DIAGNOSIS — E876 Hypokalemia: Secondary | ICD-10-CM | POA: Diagnosis not present

## 2014-01-21 DIAGNOSIS — Z8619 Personal history of other infectious and parasitic diseases: Secondary | ICD-10-CM | POA: Diagnosis not present

## 2014-01-21 DIAGNOSIS — F418 Other specified anxiety disorders: Secondary | ICD-10-CM | POA: Diagnosis not present

## 2014-01-21 DIAGNOSIS — Z87442 Personal history of urinary calculi: Secondary | ICD-10-CM | POA: Insufficient documentation

## 2014-01-21 DIAGNOSIS — Z79899 Other long term (current) drug therapy: Secondary | ICD-10-CM | POA: Diagnosis not present

## 2014-01-21 DIAGNOSIS — R1110 Vomiting, unspecified: Secondary | ICD-10-CM | POA: Diagnosis present

## 2014-01-22 ENCOUNTER — Emergency Department (HOSPITAL_COMMUNITY): Payer: BC Managed Care – PPO

## 2014-01-22 ENCOUNTER — Encounter (HOSPITAL_COMMUNITY): Payer: Self-pay | Admitting: Emergency Medicine

## 2014-01-22 ENCOUNTER — Emergency Department (HOSPITAL_COMMUNITY)
Admission: EM | Admit: 2014-01-22 | Discharge: 2014-01-22 | Disposition: A | Discharge status: Home / Self Care | Payer: BC Managed Care – PPO | Attending: Emergency Medicine | Admitting: Emergency Medicine

## 2014-01-22 DIAGNOSIS — R519 Headache, unspecified: Secondary | ICD-10-CM

## 2014-01-22 DIAGNOSIS — E876 Hypokalemia: Secondary | ICD-10-CM

## 2014-01-22 DIAGNOSIS — R51 Headache: Secondary | ICD-10-CM

## 2014-01-22 DIAGNOSIS — R112 Nausea with vomiting, unspecified: Secondary | ICD-10-CM

## 2014-01-22 LAB — URINALYSIS, ROUTINE W REFLEX MICROSCOPIC
Bilirubin Urine: NEGATIVE
GLUCOSE, UA: NEGATIVE mg/dL
Hgb urine dipstick: NEGATIVE
KETONES UR: 40 mg/dL — AB
LEUKOCYTES UA: NEGATIVE
NITRITE: NEGATIVE
PH: 6 (ref 5.0–8.0)
Protein, ur: 30 mg/dL — AB
SPECIFIC GRAVITY, URINE: 1.021 (ref 1.005–1.030)
Urobilinogen, UA: 0.2 mg/dL (ref 0.0–1.0)

## 2014-01-22 LAB — I-STAT CHEM 8, ED
BUN: 15 mg/dL (ref 6–23)
CHLORIDE: 113 meq/L — AB (ref 96–112)
Calcium, Ion: 1.16 mmol/L (ref 1.12–1.23)
Creatinine, Ser: 0.4 mg/dL — ABNORMAL LOW (ref 0.50–1.10)
Glucose, Bld: 80 mg/dL (ref 70–99)
HEMATOCRIT: 32 % — AB (ref 36.0–46.0)
Hemoglobin: 10.9 g/dL — ABNORMAL LOW (ref 12.0–15.0)
POTASSIUM: 2.8 meq/L — AB (ref 3.7–5.3)
SODIUM: 146 meq/L (ref 137–147)
TCO2: 21 mmol/L (ref 0–100)

## 2014-01-22 LAB — CBC
HCT: 36.4 % (ref 36.0–46.0)
HEMOGLOBIN: 12.2 g/dL (ref 12.0–15.0)
MCH: 35.2 pg — AB (ref 26.0–34.0)
MCHC: 33.5 g/dL (ref 30.0–36.0)
MCV: 104.9 fL — ABNORMAL HIGH (ref 78.0–100.0)
PLATELETS: 295 10*3/uL (ref 150–400)
RBC: 3.47 MIL/uL — AB (ref 3.87–5.11)
RDW: 14.4 % (ref 11.5–15.5)
WBC: 10.3 10*3/uL (ref 4.0–10.5)

## 2014-01-22 LAB — I-STAT TROPONIN, ED: TROPONIN I, POC: 0 ng/mL (ref 0.00–0.08)

## 2014-01-22 LAB — URINE MICROSCOPIC-ADD ON

## 2014-01-22 LAB — BASIC METABOLIC PANEL
ANION GAP: 16 — AB (ref 5–15)
BUN: 18 mg/dL (ref 6–23)
CALCIUM: 9.1 mg/dL (ref 8.4–10.5)
CO2: 22 meq/L (ref 19–32)
Chloride: 111 mEq/L (ref 96–112)
Creatinine, Ser: 0.5 mg/dL (ref 0.50–1.10)
GFR calc Af Amer: >90 mL/min (ref >90)
GFR calc non Af Amer: >90 mL/min (ref >90)
GLUCOSE: 99 mg/dL (ref 70–99)
Potassium: 3.5 mEq/L — ABNORMAL LOW (ref 3.7–5.3)
Sodium: 149 mEq/L — ABNORMAL HIGH (ref 137–147)

## 2014-01-22 MED ORDER — SODIUM CHLORIDE 0.9 % IV SOLN
1000.0000 mL | Freq: Once | INTRAVENOUS | Status: AC
  Administered 2014-01-22: 1000 mL via INTRAVENOUS

## 2014-01-22 MED ORDER — DIPHENHYDRAMINE HCL 50 MG/ML IJ SOLN
25.0000 mg | Freq: Once | INTRAMUSCULAR | Status: AC
  Administered 2014-01-22: 25 mg via INTRAVENOUS
  Filled 2014-01-22: qty 1

## 2014-01-22 MED ORDER — PROMETHAZINE HCL 25 MG/ML IJ SOLN
12.5000 mg | Freq: Once | INTRAMUSCULAR | Status: AC
  Administered 2014-01-22: 12.5 mg via INTRAVENOUS
  Filled 2014-01-22: qty 1

## 2014-01-22 MED ORDER — PROMETHAZINE HCL 25 MG PO TABS: 25.0000 mg | ORAL_TABLET | Freq: Four times a day (QID) | ORAL | Status: DC | PRN

## 2014-01-22 MED ORDER — PROMETHAZINE HCL 25 MG RE SUPP: 25.0000 mg | Freq: Four times a day (QID) | RECTAL | Status: DC | PRN

## 2014-01-22 MED ORDER — SODIUM CHLORIDE 0.9 % IV SOLN
1000.0000 mL | INTRAVENOUS | Status: DC
  Administered 2014-01-22: 1000 mL via INTRAVENOUS

## 2014-01-22 MED ORDER — POTASSIUM CHLORIDE 20 MEQ/15ML (10%) PO LIQD
40.0000 meq | Freq: Once | ORAL | Status: AC
  Administered 2014-01-22: 40 meq via ORAL
  Filled 2014-01-22: qty 30

## 2014-01-22 MED ORDER — LORAZEPAM 2 MG/ML IJ SOLN
1.0000 mg | Freq: Once | INTRAMUSCULAR | Status: AC
  Administered 2014-01-22: 1 mg via INTRAVENOUS
  Filled 2014-01-22: qty 1

## 2014-01-22 MED ORDER — METOCLOPRAMIDE HCL 5 MG/ML IJ SOLN
10.0000 mg | Freq: Once | INTRAMUSCULAR | Status: AC
  Administered 2014-01-22: 10 mg via INTRAVENOUS
  Filled 2014-01-22: qty 2

## 2014-01-22 NOTE — ED Notes (Signed)
Pt requesting nausea medication. Will inform MD Otter.

## 2014-01-22 NOTE — ED Notes (Signed)
MD at bedside. 

## 2014-01-22 NOTE — ED Provider Notes (Signed)
CSN: [id number]     Arrival date & time 01/21/14  2325 History   First MD Initiated Contact with Patient 01/22/14 0109     Chief Complaint  Patient presents with  . Vomiting  . Chest Pain     (Consider location/radiation/quality/duration/timing/severity/associated sxs/prior Treatment) HPI 48 year old female presents to emergency room with nausea and vomiting.  Symptoms started around 4:00 yesterday.  She denies any known sick contacts, no unusual foods.  Patient recently returned from and hand.  She denies any fever or chills.  She has had some loose stools, but has this chronically due to colitis.  Patient also complaining of chest pain that started later in the evening.  Pain with movement, denies any shortness of breath or diaphoresis.  No prior history of coronary disease.  Patient also complaining of headache.  She has history of migraine headaches and reports her headache is similar to prior migraines. Past Medical History  Diagnosis Date  . Collagenous colitis   . WPW (Wolff-Parkinson-White syndrome)   . Addison disease   . Thyroid disease   . Clostridium difficile diarrhea   . Anxiety and depression   . Chronic diarrhea   . Chronic back pain   . Drug-seeking behavior   . Irritable bowel syndrome (IBS)   . Anemia   . Pancreatitis   . Kidney stone   . Kidney stone   . Depression   . Anxiety   . Vaginal delivery [id number], 1998   Past Surgical History  Procedure Laterality Date  . Cholecystectomy    . Tubal ligation    . Lithotripsy    . Sphincterotomy    . Cardiac electrophysiology mapping and ablation      for WPW  . Video bronchoscopy  08/31/2011    Procedure: VIDEO BRONCHOSCOPY WITH FLUORO;  Surgeon: Elsie Stain, MD;  Location: [facility];  Service: Cardiopulmonary;  Laterality: N/A;  . Vaginal hysterectomy  10/28/2013    Procedure: HYSTERECTOMY VAGINAL;  Surgeon: Emily Filbert, MD;  Location: [facility];  Service: Gynecology;;  . Salpingoophorectomy Bilateral  10/28/2013    Procedure: SALPINGO OOPHORECTOMY;  Surgeon: Emily Filbert, MD;  Location: [facility];  Service: Gynecology;  Laterality: Bilateral;  . Abdominal hysterectomy  10/28/2013   Family History  Problem Relation Age of Onset  . Prostate cancer Father   . Coronary artery disease Maternal Grandfather    History  Substance Use Topics  . Smoking status: Current Every Day Smoker -- 0.25 packs/day for 30 years    Types: Cigarettes  . Smokeless tobacco: Never Used  . Alcohol Use: Yes     Comment: seldom   OB History   Grav Para Term Preterm Abortions TAB SAB Ect Mult Living   3 3 3       3      Review of Systems  See History of Present Illness; otherwise all other systems are reviewed and negative   Allergies  Prednisone; Butrans; Nsaids; and Zofran  Home Medications   Prior to Admission medications   Medication Sig Start Date End Date Taking? Authorizing Provider  escitalopram (LEXAPRO) 20 MG tablet Take 20 mg by mouth every morning.    Yes Historical Provider, MD  LORazepam (ATIVAN) 1 MG tablet Take 0.5-1 mg by mouth 2 (two) times daily as needed for anxiety.   Yes Historical Provider, MD  Multiple Vitamin (MULITIVITAMIN WITH MINERALS) TABS Take 1 tablet by mouth every morning.    Yes Historical Provider, MD  potassium chloride (KLOR-CON M10)  10 MEQ tablet Take by mouth.   Yes Historical Provider, MD  promethazine (PHENERGAN) 25 MG tablet Take 1 tablet (25 mg total) by mouth every 6 (six) hours as needed for nausea or vomiting. 01/07/14  Yes Olegario Messier, NP  zolpidem (AMBIEN) 10 MG tablet Take 1 tablet (10 mg total) by mouth at bedtime as needed for sleep. 01/07/14 02/06/14 Yes Olegario Messier, NP  promethazine (PHENERGAN) 25 MG suppository Place 1 suppository (25 mg total) rectally every 6 (six) hours as needed for nausea or vomiting. 01/22/14   Kalman Drape, MD  promethazine (PHENERGAN) 25 MG tablet Take 1 tablet (25 mg total) by mouth every 6 (six) hours as needed for nausea.  01/22/14   Kalman Drape, MD   BP 131/71  Pulse 93  Temp(Src) 97.9 F (36.6 C)  Resp 18  SpO2 100% Physical Exam  Nursing note and vitals reviewed. Constitutional: She is oriented to person, place, and time. She appears well-developed and well-nourished.  HENT:  Head: Normocephalic and atraumatic.  Nose: Nose normal.  Mouth/Throat: Oropharynx is clear and moist.  Eyes: Conjunctivae and EOM are normal. Pupils are equal, round, and reactive to light.  Neck: Normal range of motion. Neck supple. No JVD present. No tracheal deviation present. No thyromegaly present.  Cardiovascular: Normal rate, regular rhythm, normal heart sounds and intact distal pulses.  Exam reveals no gallop and no friction rub.   No murmur heard. Pulmonary/Chest: Effort normal and breath sounds normal. No stridor. No respiratory distress. She has no wheezes. She has no rales. She exhibits no tenderness.  Abdominal: Soft. Bowel sounds are normal. She exhibits no distension and no mass. There is no tenderness. There is no rebound and no guarding.  Musculoskeletal: Normal range of motion. She exhibits no edema and no tenderness.  Lymphadenopathy:    She has no cervical adenopathy.  Neurological: She is alert and oriented to person, place, and time. She displays normal reflexes. She exhibits normal muscle tone. Coordination normal.  Skin: Skin is warm and dry. No rash noted. No erythema. No pallor.  Psychiatric: She has a normal mood and affect. Her behavior is normal. Judgment and thought content normal.    ED Course  Procedures (including critical care time) Labs Review Labs Reviewed  CBC - Abnormal; Notable for the following:    RBC 3.47 (*)    MCV 104.9 (*)    MCH 35.2 (*)    All other components within normal limits  BASIC METABOLIC PANEL - Abnormal; Notable for the following:    Sodium 149 (*)    Potassium 3.5 (*)    Anion gap 16 (*)    All other components within normal limits  URINALYSIS, ROUTINE W  REFLEX MICROSCOPIC - Abnormal; Notable for the following:    APPearance CLOUDY (*)    Ketones, ur 40 (*)    Protein, ur 30 (*)    All other components within normal limits  URINE MICROSCOPIC-ADD ON - Abnormal; Notable for the following:    Squamous Epithelial / LPF FEW (*)    Bacteria, UA FEW (*)    All other components within normal limits  I-STAT CHEM 8, ED - Abnormal; Notable for the following:    Potassium 2.8 (*)    Chloride 113 (*)    Creatinine, Ser 0.40 (*)    Hemoglobin 10.9 (*)    HCT 32.0 (*)    All other components within normal limits  Randolm Idol, ED    Imaging  Review Dg Chest 2 View  01/22/2014   CLINICAL DATA:  Vomiting and chest pain for 48 hr. Right anterior chest pain radiating to the back.  EXAM: CHEST  2 VIEW  COMPARISON:  09/19/2013  FINDINGS: Mild hyperinflation. Azygos lobe. The heart size and mediastinal contours are within normal limits. Both lungs are clear. The visualized skeletal structures are unremarkable.  IMPRESSION: No active cardiopulmonary disease.   Electronically Signed   By: Lucienne Capers M.D.   On: 01/22/2014 02:53     EKG Interpretation   Date/Time:  Wednesday January 22 2014 00:25:09 EDT Ventricular Rate:  97 PR Interval:  182 QRS Duration: 76 QT Interval:  360 QTC Calculation: 457 R Axis:   55 Text Interpretation:  Normal sinus rhythm with sinus arrhythmia Normal ECG  Confirmed by Carigan Lister  MD, Penny Frisbie ([id number]) on 01/22/2014 1:19:12 AM      MDM   Final diagnoses:  Non-intractable vomiting with nausea, vomiting of unspecified type  Acute nonintractable headache, unspecified headache type  Hypokalemia    48 year old female with nausea and vomiting.  No abdominal pain.  Labs show slightly elevated sodium.  Plan for aggressive fluid hydration, control of nausea and vomiting.  EKG without ischemic changes, troponin negative.   Patient reports nausea and that vomiting is better, but headache is worsening.  Will try Reglan and  Benadryl.   Patient has received 2 L of IV fluids.  Her sodium has improved.  Her potassium is low which may be due to dilution.  She is to receive an oral dose of potassium here, and will receive handout on dietary sources of potassium.  She has no longer had any nausea or vomiting or headache.    Kalman Drape, MD 01/22/14 [phone]

## 2014-01-22 NOTE — ED Notes (Signed)
Patient c/o continued severe nausea as well as migraine headache. Patient is requesting Imitrex and additional nausea medication. Advised patient I would speak to the physician.

## 2014-01-22 NOTE — ED Notes (Signed)
Pt aware of the need for a urine sample. 

## 2014-01-22 NOTE — ED Notes (Signed)
Pt transported to XRAY °

## 2014-01-22 NOTE — ED Notes (Signed)
Pt reports n/v which started yesterday around 1600.  Pt also reports L side cp which started last night at 2100.  Describes pain as squeezing pain.

## 2014-01-22 NOTE — Discharge Instructions (Signed)
Headaches, Frequently Asked Questions MIGRAINE HEADACHES Q: What is migraine? What causes it? How can I treat it? A: Generally, migraine headaches begin as a dull ache. Then they develop into a constant, throbbing, and pulsating pain. You may experience pain at the temples. You may experience pain at the front or back of one or both sides of the head. The pain is usually accompanied by a combination of:  Nausea.  Vomiting.  Sensitivity to light and noise. Some people (about 15%) experience an aura (see below) before an attack. The cause of migraine is believed to be chemical reactions in the brain. Treatment for migraine may include over-the-counter or prescription medications. It may also include self-help techniques. These include relaxation training and biofeedback.  Q: What is an aura? A: About 15% of people with migraine get an "aura". This is a sign of neurological symptoms that occur before a migraine headache. You may see wavy or jagged lines, dots, or flashing lights. You might experience tunnel vision or blind spots in one or both eyes. The aura can include visual or auditory hallucinations (something imagined). It may include disruptions in smell (such as strange odors), taste or touch. Other symptoms include:  Numbness.  A "pins and needles" sensation.  Difficulty in recalling or speaking the correct word. These neurological events may last as long as 60 minutes. These symptoms will fade as the headache begins. Q: What is a trigger? A: Certain physical or environmental factors can lead to or "trigger" a migraine. These include:  Foods.  Hormonal changes.  Weather.  Stress. It is important to remember that triggers are different for everyone. To help prevent migraine attacks, you need to figure out which triggers affect you. Keep a headache diary. This is a good way to track triggers. The diary will help you talk to your healthcare professional about your condition. Q: Does  weather affect migraines? A: Bright sunshine, hot, humid conditions, and drastic changes in barometric pressure may lead to, or "trigger," a migraine attack in some people. But studies have shown that weather does not act as a trigger for everyone with migraines. Q: What is the link between migraine and hormones? A: Hormones start and regulate many of your body's functions. Hormones keep your body in balance within a constantly changing environment. The levels of hormones in your body are unbalanced at times. Examples are during menstruation, pregnancy, or menopause. That can lead to a migraine attack. In fact, about three quarters of all women with migraine report that their attacks are related to the menstrual cycle.  Q: Is there an increased risk of stroke for migraine sufferers? A: The likelihood of a migraine attack causing a stroke is very remote. That is not to say that migraine sufferers cannot have a stroke associated with their migraines. In persons under age 53, the most common associated factor for stroke is migraine headache. But over the course of a person's normal life span, the occurrence of migraine headache may actually be associated with a reduced risk of dying from cerebrovascular disease due to stroke.  Q: What are acute medications for migraine? A: Acute medications are used to treat the pain of the headache after it has started. Examples over-the-counter medications, NSAIDs, ergots, and triptans.  Q: What are the triptans? A: Triptans are the newest class of abortive medications. They are specifically targeted to treat migraine. Triptans are vasoconstrictors. They moderate some chemical reactions in the brain. The triptans work on receptors in your brain. Triptans help  to restore the balance of a neurotransmitter called serotonin. Fluctuations in levels of serotonin are thought to be a main cause of migraine.  °Q: Are over-the-counter medications for migraine effective? °A:  Over-the-counter, or "OTC," medications may be effective in relieving mild to moderate pain and associated symptoms of migraine. But you should see your caregiver before beginning any treatment regimen for migraine.  °Q: What are preventive medications for migraine? °A: Preventive medications for migraine are sometimes referred to as "prophylactic" treatments. They are used to reduce the frequency, severity, and length of migraine attacks. Examples of preventive medications include antiepileptic medications, antidepressants, beta-blockers, calcium channel blockers, and NSAIDs (nonsteroidal anti-inflammatory drugs). °Q: Why are anticonvulsants used to treat migraine? °A: During the past few years, there has been an increased interest in antiepileptic drugs for the prevention of migraine. They are sometimes referred to as "anticonvulsants". Both epilepsy and migraine may be caused by similar reactions in the brain.  °Q: Why are antidepressants used to treat migraine? °A: Antidepressants are typically used to treat people with depression. They may reduce migraine frequency by regulating chemical levels, such as serotonin, in the brain.  °Q: What alternative therapies are used to treat migraine? °A: The term "alternative therapies" is often used to describe treatments considered outside the scope of conventional Western medicine. Examples of alternative therapy include acupuncture, acupressure, and yoga. Another common alternative treatment is herbal therapy. Some herbs are believed to relieve headache pain. Always discuss alternative therapies with your caregiver before proceeding. Some herbal products contain arsenic and other toxins. °TENSION HEADACHES °Q: What is a tension-type headache? What causes it? How can I treat it? °A: Tension-type headaches occur randomly. They are often the result of temporary stress, anxiety, fatigue, or anger. Symptoms include soreness in your temples, a tightening band-like sensation  around your head (a "vice-like" ache). Symptoms can also include a pulling feeling, pressure sensations, and contracting head and neck muscles. The headache begins in your forehead, temples, or the back of your head and neck. Treatment for tension-type headache may include over-the-counter or prescription medications. Treatment may also include self-help techniques such as relaxation training and biofeedback. °CLUSTER HEADACHES °Q: What is a cluster headache? What causes it? How can I treat it? °A: Cluster headache gets its name because the attacks come in groups. The pain arrives with little, if any, warning. It is usually on one side of the head. A tearing or bloodshot eye and a runny nose on the same side of the headache may also accompany the pain. Cluster headaches are believed to be caused by chemical reactions in the brain. They have been described as the most severe and intense of any headache type. Treatment for cluster headache includes prescription medication and oxygen. °SINUS HEADACHES °Q: What is a sinus headache? What causes it? How can I treat it? °A: When a cavity in the bones of the face and skull (a sinus) becomes inflamed, the inflammation will cause localized pain. This condition is usually the result of an allergic reaction, a tumor, or an infection. If your headache is caused by a sinus blockage, such as an infection, you will probably have a fever. An x-ray will confirm a sinus blockage. Your caregiver's treatment might include antibiotics for the infection, as well as antihistamines or decongestants.  °REBOUND HEADACHES °Q: What is a rebound headache? What causes it? How can I treat it? °A: A pattern of taking acute headache medications too often can lead to a condition known as "rebound headache."   A pattern of taking too much headache medication includes taking it more than 2 days per week or in excessive amounts. That means more than the label or a caregiver advises. With rebound  headaches, your medications not only stop relieving pain, they actually begin to cause headaches. Doctors treat rebound headache by tapering the medication that is being overused. Sometimes your caregiver will gradually substitute a different type of treatment or medication. Stopping may be a challenge. Regularly overusing a medication increases the potential for serious side effects. Consult a caregiver if you regularly use headache medications more than 2 days per week or more than the label advises. ADDITIONAL QUESTIONS AND ANSWERS Q: What is biofeedback? A: Biofeedback is a self-help treatment. Biofeedback uses special equipment to monitor your body's involuntary physical responses. Biofeedback monitors:  Breathing.  Pulse.  Heart rate.  Temperature.  Muscle tension.  Brain activity. Biofeedback helps you refine and perfect your relaxation exercises. You learn to control the physical responses that are related to stress. Once the technique has been mastered, you do not need the equipment any more. Q: Are headaches hereditary? A: Four out of five (80%) of people that suffer report a family history of migraine. Scientists are not sure if this is genetic or a family predisposition. Despite the uncertainty, a child has a 50% chance of having migraine if one parent suffers. The child has a 75% chance if both parents suffer.  Q: Can children get headaches? A: By the time they reach high school, most young people have experienced some type of headache. Many safe and effective approaches or medications can prevent a headache from occurring or stop it after it has begun.  Q: What type of doctor should I see to diagnose and treat my headache? A: Start with your primary caregiver. Discuss his or her experience and approach to headaches. Discuss methods of classification, diagnosis, and treatment. Your caregiver may decide to recommend you to a headache specialist, depending upon your symptoms or other  physical conditions. Having diabetes, allergies, etc., may require a more comprehensive and inclusive approach to your headache. The National Head[employer] Foundation will provide, upon request, a list of [facility] physician members in your state. Document Released: 06/11/2003 Document Revised: 06/13/2011 Document Reviewed: 11/19/2007 [facility] Patient Information 2015 [place], [place]. This information is not intended to replace advice given to you by your health care provider. Make sure you discuss any questions you have with your health care provider.  Nausea and Vomiting Nausea is a sick feeling that often comes before throwing up (vomiting). Vomiting is a reflex where stomach contents come out of your mouth. Vomiting can cause severe loss of body fluids (dehydration). Children and elderly adults can become dehydrated quickly, especially if they also have diarrhea. Nausea and vomiting are symptoms of a condition or disease. It is important to find the cause of your symptoms. CAUSES   Direct irritation of the stomach lining. This irritation can result from increased acid production (gastroesophageal reflux disease), infection, food poisoning, taking certain medicines (such as nonsteroidal anti-inflammatory drugs), alcohol use, or tobacco use.  Signals from the brain.These signals could be caused by a headache, heat exposure, an inner ear disturbance, increased pressure in the brain from injury, infection, a tumor, or a concussion, pain, emotional stimulus, or metabolic problems.  An obstruction in the gastrointestinal tract (bowel obstruction).  Illnesses such as diabetes, hepatitis, gallbladder problems, appendicitis, kidney problems, cancer, sepsis, atypical symptoms of a heart attack, or eating disorders.  Medical treatments such  as chemotherapy and radiation.  Receiving medicine that makes you sleep (general anesthetic) during surgery. DIAGNOSIS Your caregiver may ask for tests to be done if the  problems do not improve after a few days. Tests may also be done if symptoms are severe or if the reason for the nausea and vomiting is not clear. Tests may include:  Urine tests.  Blood tests.  Stool tests.  Cultures (to look for evidence of infection).  X-rays or other imaging studies. Test results can help your caregiver make decisions about treatment or the need for additional tests. TREATMENT You need to stay well hydrated. Drink frequently but in small amounts.You may wish to drink water, sports drinks, clear broth, or eat frozen ice pops or gelatin dessert to help stay hydrated.When you eat, eating slowly may help prevent nausea.There are also some antinausea medicines that may help prevent nausea. HOME CARE INSTRUCTIONS   Take all medicine as directed by your caregiver.  If you do not have an appetite, do not force yourself to eat. However, you must continue to drink fluids.  If you have an appetite, eat a normal diet unless your caregiver tells you differently.  Eat a variety of complex carbohydrates (rice, wheat, potatoes, bread), lean meats, yogurt, fruits, and vegetables.  Avoid high-fat foods because they are more difficult to digest.  Drink enough water and fluids to keep your urine clear or pale yellow.  If you are dehydrated, ask your caregiver for specific rehydration instructions. Signs of dehydration may include:  Severe thirst.  Dry lips and mouth.  Dizziness.  Dark urine.  Decreasing urine frequency and amount.  Confusion.  Rapid breathing or pulse. SEEK IMMEDIATE MEDICAL CARE IF:   You have blood or brown flecks (like coffee grounds) in your vomit.  You have black or bloody stools.  You have a severe headache or stiff neck.  You are confused.  You have severe abdominal pain.  You have chest pain or trouble breathing.  You do not urinate at least once every 8 hours.  You develop cold or clammy skin.  You continue to vomit for longer  than 24 to 48 hours.  You have a fever. MAKE SURE YOU:   Understand these instructions.  Will watch your condition.  Will get help right away if you are not doing well or get worse. Document Released: 03/21/2005 Document Revised: 06/13/2011 Document Reviewed: 08/18/2010 [facility] Patient Information 2015 [place], [place]. This information is not intended to replace advice given to you by your health care provider. Make sure you discuss any questions you have with your health care provider.  Potassium Content of Foods Potassium is a mineral found in many foods and drinks. It helps keep fluids and minerals balanced in your body and affects how steadily your heart beats. Potassium also helps control your blood pressure and keep your muscles and nervous system healthy. Certain health conditions and medicines may change the balance of potassium in your body. When this happens, you can help balance your level of potassium through the foods that you do or do not eat. Your health care provider or dietitian may recommend an amount of potassium that you should have each day. The following lists of foods provide the amount of potassium (in parentheses) per serving in each item. HIGH IN POTASSIUM  The following foods and beverages have 200 mg or more of potassium per serving:  Apricots, 2 raw or 5 dry (200 mg).  Artichoke, 1 medium (345 mg).  Avocado, raw,  each (245 mg).  Banana, 1 medium (425 mg).  Beans, lima, or baked beans, canned,  cup (280 mg).  Beans, white, canned,  cup (595 mg).  Beef roast, 3 oz (320 mg).  Beef, ground, 3 oz (270 mg).  Beets, raw or cooked,  cup (260 mg).  Bran muffin, 2 oz (300 mg).  Broccoli,  cup (230 mg).  Brussels sprouts,  cup (250 mg).  Cantaloupe,  cup (215 mg).  Cereal, 100% bran,  cup (200-400 mg).  Cheeseburger, single, fast food, 1 each (225-400 mg).  Chicken, 3 oz (220 mg).  Clams, canned, 3 oz (535 mg).  Crab, 3 oz (225  mg).  Dates, 5 each (270 mg).  Dried beans and peas,  cup (300-475 mg).  Figs, dried, 2 each (260 mg).  Fish: halibut, tuna, cod, snapper, 3 oz (480 mg).  Fish: salmon, haddock, swordfish, perch, 3 oz (300 mg).  Fish, tuna, canned 3 oz (200 mg).  [place] fries, fast food, 3 oz (470 mg).  Granola with fruit and nuts,  cup (200 mg).  Grapefruit juice,  cup (200 mg).  Greens, beet,  cup (655 mg).  Honeydew melon,  cup (200 mg).  Kale, raw, 1 cup (300 mg).  Kiwi, 1 medium (240 mg).  Kohlrabi, rutabaga, parsnips,  cup (280 mg).  Lentils,  cup (365 mg).  Mango, 1 each (325 mg).  Milk, chocolate, 1 cup (420 mg).  Milk: nonfat, low-fat, whole, buttermilk, 1 cup (350-380 mg).  Molasses, 1 Tbsp (295 mg).  Mushrooms,  cup (280) mg.  Nectarine, 1 each (275 mg).  Nuts: almonds, peanuts, hazelnuts, [place], cashew, mixed, 1 oz (200 mg).  Nuts, pistachios, 1 oz (295 mg).  Orange, 1 each (240 mg).  Orange juice,  cup (235 mg).  Papaya, medium,  fruit (390 mg).  Peanut butter, chunky, 2 Tbsp (240 mg).  Peanut butter, smooth, 2 Tbsp (210 mg).  Pear, 1 medium (200 mg).  Pomegranate, 1 whole (400 mg).  Pomegranate juice,  cup (215 mg).  Pork, 3 oz (350 mg).  Potato chips, salted, 1 oz (465 mg).  Potato, baked with skin, 1 medium (925 mg).  Potatoes, boiled,  cup (255 mg).  Potatoes, mashed,  cup (330 mg).  Prune juice,  cup (370 mg).  Prunes, 5 each (305 mg).  Pudding, chocolate,  cup (230 mg).  Pumpkin, canned,  cup (250 mg).  Raisins, seedless,  cup (270 mg).  Seeds, sunflower or pumpkin, 1 oz (240 mg).  Soy milk, 1 cup (300 mg).  Spinach,  cup (420 mg).  Spinach, canned,  cup (370 mg).  Sweet potato, baked with skin, 1 medium (450 mg).  Swiss chard,  cup (480 mg).  Tomato or vegetable juice,  cup (275 mg).  Tomato sauce or puree,  cup (400-550 mg).  Tomato, raw, 1 medium (290 mg).  Tomatoes, canned,  cup  (200-300 mg).  [place], 3 oz (250 mg).  Wheat germ, 1 oz (250 mg).  Winter squash,  cup (250 mg).  Yogurt, plain or fruited, 6 oz (260-435 mg).  Zucchini,  cup (220 mg). MODERATE IN POTASSIUM The following foods and beverages have 50-200 mg of potassium per serving:  Apple, 1 each (150 mg).  Apple juice,  cup (150 mg).  Applesauce,  cup (90 mg).  Apricot nectar,  cup (140 mg).  Asparagus, small spears,  cup or 6 spears (155 mg).  Bagel, cinnamon raisin, 1 each (130 mg).  Bagel, egg or plain, 4 in., 1 each (  70 mg).  Beans, green,  cup (90 mg).  Beans, yellow,  cup (190 mg).  Beer, regular, 12 oz (100 mg).  Beets, canned,  cup (125 mg).  Blackberries,  cup (115 mg).  Blueberries,  cup (60 mg).  Bread, whole wheat, 1 slice (70 mg).  Broccoli, raw,  cup (145 mg).  Cabbage,  cup (150 mg).  Carrots, cooked or raw,  cup (180 mg).  Cauliflower, raw,  cup (150 mg).  Celery, raw,  cup (155 mg).  Cereal, bran flakes, cup (120-150 mg).  Cheese, cottage,  cup (110 mg).  Cherries, 10 each (150 mg).  Chocolate, 1 oz bar (165 mg).  Coffee, brewed 6 oz (90 mg).  Corn,  cup or 1 ear (195 mg).  Cucumbers,  cup (80 mg).  Egg, large, 1 each (60 mg).  Eggplant,  cup (60 mg).  Endive, raw, cup (80 mg).  English muffin, 1 each (65 mg).  Fish, orange roughy, 3 oz (150 mg).  Frankfurter, beef or pork, 1 each (75 mg).  Fruit cocktail,  cup (115 mg).  Grape juice,  cup (170 mg).  Grapefruit,  fruit (175 mg).  Grapes,  cup (155 mg).  Greens: kale, turnip, collard,  cup (110-150 mg).  Ice cream or frozen yogurt, chocolate,  cup (175 mg).  Ice cream or frozen yogurt, vanilla,  cup (120-150 mg).  Lemons, limes, 1 each (80 mg).  Lettuce, all types, 1 cup (100 mg).  Mixed vegetables,  cup (150 mg).  Mushrooms, raw,  cup (110 mg).  Nuts: walnuts, pecans, or macadamia, 1 oz (125 mg).  Oatmeal,  cup (80 mg).  Okra,   cup (110 mg).  Onions, raw,  cup (120 mg).  Peach, 1 each (185 mg).  Peaches, canned,  cup (120 mg).  Pears, canned,  cup (120 mg).  Peas, green, frozen,  cup (90 mg).  Peppers, green,  cup (130 mg).  Peppers, red,  cup (160 mg).  Pineapple juice,  cup (165 mg).  Pineapple, fresh or canned,  cup (100 mg).  Plums, 1 each (105 mg).  Pudding, vanilla,  cup (150 mg).  Raspberries,  cup (90 mg).  Rhubarb,  cup (115 mg).  Rice, wild,  cup (80 mg).  Shrimp, 3 oz (155 mg).  Spinach, raw, 1 cup (170 mg).  Strawberries,  cup (125 mg).  Summer squash  cup (175-200 mg).  Swiss chard, raw, 1 cup (135 mg).  Tangerines, 1 each (140 mg).  Tea, brewed, 6 oz (65 mg).  Turnips,  cup (140 mg).  Watermelon,  cup (85 mg).  Wine, red, table, 5 oz (180 mg).  Wine, white, table, 5 oz (100 mg). LOW IN POTASSIUM The following foods and beverages have less than 50 mg of potassium per serving.  Bread, white, 1 slice (30 mg).  Carbonated beverages, 12 oz (less than 5 mg).  Cheese, 1 oz (20-30 mg).  Cranberries,  cup (45 mg).  Cranberry juice cocktail,  cup (20 mg).  Fats and oils, 1 Tbsp (less than 5 mg).  Hummus, 1 Tbsp (32 mg).  Nectar: papaya, mango, or pear,  cup (35 mg).  Rice, white or brown,  cup (50 mg).  Spaghetti or macaroni,  cup cooked (30 mg).  Tortilla, flour or corn, 1 each (50 mg).  Waffle, 4 in., 1 each (50 mg).  Water chestnuts,  cup (40 mg). Document Released: 11/02/2004 Document Revised: 03/26/2013 Document Reviewed: 02/15/2013 [facility] Patient Information 2015 [place], [place]. This information is not intended  to replace advice given to you by your health care provider. Make sure you discuss any questions you have with your health care provider. ° °

## 2014-01-22 NOTE — ED Notes (Signed)
Right hand 20 gauge removed per protocol. IV site clean, dry, and intact upon removal.

## 2014-01-27 ENCOUNTER — Emergency Department (HOSPITAL_COMMUNITY): Payer: BC Managed Care – PPO

## 2014-01-27 ENCOUNTER — Emergency Department (HOSPITAL_COMMUNITY)
Admission: EM | Admit: 2014-01-27 | Discharge: 2014-01-27 | Disposition: A | Discharge status: Home / Self Care | Payer: BC Managed Care – PPO | Attending: Emergency Medicine | Admitting: Emergency Medicine

## 2014-01-27 ENCOUNTER — Encounter (HOSPITAL_COMMUNITY): Payer: Self-pay | Admitting: Emergency Medicine

## 2014-01-27 DIAGNOSIS — R109 Unspecified abdominal pain: Secondary | ICD-10-CM

## 2014-01-27 DIAGNOSIS — Z9851 Tubal ligation status: Secondary | ICD-10-CM | POA: Diagnosis not present

## 2014-01-27 DIAGNOSIS — G8929 Other chronic pain: Secondary | ICD-10-CM | POA: Diagnosis not present

## 2014-01-27 DIAGNOSIS — Z9049 Acquired absence of other specified parts of digestive tract: Secondary | ICD-10-CM | POA: Diagnosis not present

## 2014-01-27 DIAGNOSIS — Z8739 Personal history of other diseases of the musculoskeletal system and connective tissue: Secondary | ICD-10-CM | POA: Insufficient documentation

## 2014-01-27 DIAGNOSIS — Z720 Tobacco use: Secondary | ICD-10-CM | POA: Insufficient documentation

## 2014-01-27 DIAGNOSIS — K529 Noninfective gastroenteritis and colitis, unspecified: Secondary | ICD-10-CM | POA: Diagnosis not present

## 2014-01-27 DIAGNOSIS — Z8639 Personal history of other endocrine, nutritional and metabolic disease: Secondary | ICD-10-CM | POA: Diagnosis not present

## 2014-01-27 DIAGNOSIS — Z90710 Acquired absence of both cervix and uterus: Secondary | ICD-10-CM | POA: Insufficient documentation

## 2014-01-27 DIAGNOSIS — F419 Anxiety disorder, unspecified: Secondary | ICD-10-CM | POA: Insufficient documentation

## 2014-01-27 DIAGNOSIS — Z8679 Personal history of other diseases of the circulatory system: Secondary | ICD-10-CM | POA: Diagnosis not present

## 2014-01-27 DIAGNOSIS — Z87442 Personal history of urinary calculi: Secondary | ICD-10-CM | POA: Diagnosis not present

## 2014-01-27 DIAGNOSIS — Z79899 Other long term (current) drug therapy: Secondary | ICD-10-CM | POA: Diagnosis not present

## 2014-01-27 DIAGNOSIS — Z3202 Encounter for pregnancy test, result negative: Secondary | ICD-10-CM | POA: Insufficient documentation

## 2014-01-27 DIAGNOSIS — Z862 Personal history of diseases of the blood and blood-forming organs and certain disorders involving the immune mechanism: Secondary | ICD-10-CM | POA: Insufficient documentation

## 2014-01-27 DIAGNOSIS — F329 Major depressive disorder, single episode, unspecified: Secondary | ICD-10-CM | POA: Diagnosis not present

## 2014-01-27 LAB — URINALYSIS, ROUTINE W REFLEX MICROSCOPIC
Bilirubin Urine: NEGATIVE
Glucose, UA: NEGATIVE mg/dL
Ketones, ur: NEGATIVE mg/dL
Leukocytes, UA: NEGATIVE
NITRITE: NEGATIVE
PROTEIN: NEGATIVE mg/dL
SPECIFIC GRAVITY, URINE: 1.018 (ref 1.005–1.030)
Urobilinogen, UA: 0.2 mg/dL (ref 0.0–1.0)
pH: 6 (ref 5.0–8.0)

## 2014-01-27 LAB — URINE MICROSCOPIC-ADD ON

## 2014-01-27 LAB — COMPREHENSIVE METABOLIC PANEL
ALT: 10 U/L (ref 0–35)
ANION GAP: 15 (ref 5–15)
AST: 14 U/L (ref 0–37)
Albumin: 4 g/dL (ref 3.5–5.2)
Alkaline Phosphatase: 133 U/L — ABNORMAL HIGH (ref 39–117)
BUN: 14 mg/dL (ref 6–23)
CALCIUM: 9.1 mg/dL (ref 8.4–10.5)
CO2: 22 mEq/L (ref 19–32)
Chloride: 102 mEq/L (ref 96–112)
Creatinine, Ser: 0.6 mg/dL (ref 0.50–1.10)
GFR calc non Af Amer: >90 mL/min (ref >90)
GLUCOSE: 101 mg/dL — AB (ref 70–99)
Potassium: 3.3 mEq/L — ABNORMAL LOW (ref 3.7–5.3)
SODIUM: 139 meq/L (ref 137–147)
Total Bilirubin: 0.4 mg/dL (ref 0.3–1.2)
Total Protein: 7.2 g/dL (ref 6.0–8.3)

## 2014-01-27 LAB — LIPASE, BLOOD: Lipase: 14 U/L (ref 11–59)

## 2014-01-27 LAB — CBC WITH DIFFERENTIAL/PLATELET
Basophils Absolute: 0.1 10*3/uL (ref 0.0–0.1)
Basophils Relative: 1 % (ref 0–1)
EOS ABS: 0.3 10*3/uL (ref 0.0–0.7)
Eosinophils Relative: 4 % (ref 0–5)
HCT: 38.2 % (ref 36.0–46.0)
Hemoglobin: 13.4 g/dL (ref 12.0–15.0)
LYMPHS ABS: 1 10*3/uL (ref 0.7–4.0)
Lymphocytes Relative: 13 % (ref 12–46)
MCH: 35.7 pg — AB (ref 26.0–34.0)
MCHC: 35.1 g/dL (ref 30.0–36.0)
MCV: 101.9 fL — ABNORMAL HIGH (ref 78.0–100.0)
Monocytes Absolute: 0.5 10*3/uL (ref 0.1–1.0)
Monocytes Relative: 6 % (ref 3–12)
NEUTROS PCT: 76 % (ref 43–77)
Neutro Abs: 5.8 10*3/uL (ref 1.7–7.7)
PLATELETS: 235 10*3/uL (ref 150–400)
RBC: 3.75 MIL/uL — ABNORMAL LOW (ref 3.87–5.11)
RDW: 13.9 % (ref 11.5–15.5)
WBC: 7.7 10*3/uL (ref 4.0–10.5)

## 2014-01-27 LAB — PREGNANCY, URINE: Preg Test, Ur: NEGATIVE

## 2014-01-27 MED ORDER — SODIUM CHLORIDE 0.9 % IV BOLUS (SEPSIS)
1000.0000 mL | Freq: Once | INTRAVENOUS | Status: AC
  Administered 2014-01-27: 1000 mL via INTRAVENOUS

## 2014-01-27 MED ORDER — CIPROFLOXACIN HCL 500 MG PO TABS: 500.0000 mg | ORAL_TABLET | Freq: Two times a day (BID) | ORAL | Status: DC

## 2014-01-27 MED ORDER — METOCLOPRAMIDE HCL 10 MG PO TABS
10.0000 mg | ORAL_TABLET | Freq: Once | ORAL | Status: AC
  Administered 2014-01-27: 10 mg via ORAL
  Filled 2014-01-27: qty 1

## 2014-01-27 MED ORDER — LORAZEPAM 2 MG/ML IJ SOLN
1.0000 mg | Freq: Once | INTRAMUSCULAR | Status: AC
  Administered 2014-01-27: 1 mg via INTRAVENOUS
  Filled 2014-01-27: qty 1

## 2014-01-27 MED ORDER — HYDROMORPHONE HCL 1 MG/ML IJ SOLN
1.0000 mg | Freq: Once | INTRAMUSCULAR | Status: AC
  Administered 2014-01-27: 1 mg via INTRAVENOUS
  Filled 2014-01-27: qty 1

## 2014-01-27 MED ORDER — METRONIDAZOLE 500 MG PO TABS: 500.0000 mg | ORAL_TABLET | Freq: Three times a day (TID) | ORAL | Status: DC

## 2014-01-27 MED ORDER — PROMETHAZINE HCL 25 MG/ML IJ SOLN
12.5000 mg | Freq: Once | INTRAMUSCULAR | Status: AC
  Administered 2014-01-27: 12.5 mg via INTRAVENOUS
  Filled 2014-01-27: qty 1

## 2014-01-27 NOTE — Discharge Instructions (Signed)
Colitis Colitis is inflammation of the colon. Colitis can be a short-term or long-standing (chronic) illness. Crohn's disease and ulcerative colitis are 2 types of colitis which are chronic. They usually require lifelong treatment. CAUSES  There are many different causes of colitis, including:  Viruses.  Germs (bacteria).  Medicine reactions. SYMPTOMS   Diarrhea.  Intestinal bleeding.  Pain.  Fever.  Throwing up (vomiting).  Tiredness (fatigue).  Weight loss.  Bowel blockage. DIAGNOSIS  The diagnosis of colitis is based on examination and stool or blood tests. X-rays, CT scan, and colonoscopy may also be needed. TREATMENT  Treatment may include:  Fluids given through the vein (intravenously).  Bowel rest (nothing to eat or drink for a period of time).  Medicine for pain and diarrhea.  Medicines (antibiotics) that kill germs.  Cortisone medicines.  Surgery. HOME CARE INSTRUCTIONS   Get plenty of rest.  Drink enough water and fluids to keep your urine clear or pale yellow.  Eat a well-balanced diet.  Call your caregiver for follow-up as recommended. SEEK IMMEDIATE MEDICAL CARE IF:   You develop chills.  You have an oral temperature above 102 F (38.9 C), not controlled by medicine.  You have extreme weakness, fainting, or dehydration.  You have repeated vomiting.  You develop severe belly (abdominal) pain or are passing bloody or tarry stools. MAKE SURE YOU:   Understand these instructions.  Will watch your condition.  Will get help right away if you are not doing well or get worse. Document Released: 04/28/2004 Document Revised: 06/13/2011 Document Reviewed: 07/24/2009 [facility] Patient Information 2015 [place], [place]. This information is not intended to replace advice given to you by your health care provider. Make sure you discuss any questions you have with your health care provider.   Flank Pain Flank pain refers to pain that is located  on the side of the body between the upper abdomen and the back. The pain may occur over a short period of time (acute) or may be long-term or reoccurring (chronic). It may be mild or severe. Flank pain can be caused by many things. CAUSES  Some of the more common causes of flank pain include:  Muscle strains.   Muscle spasms.   A disease of your spine (vertebral disk disease).   A lung infection (pneumonia).   Fluid around your lungs (pulmonary edema).   A kidney infection.   Kidney stones.   A very painful skin rash caused by the chickenpox virus (shingles).   Gallbladder disease.  [place] care will depend on the cause of your pain. In general,  Rest as directed by your caregiver.  Drink enough fluids to keep your urine clear or pale yellow.  Only take over-the-counter or prescription medicines as directed by your caregiver. Some medicines may help relieve the pain.  Tell your caregiver about any changes in your pain.  Follow up with your caregiver as directed. SEEK IMMEDIATE MEDICAL CARE IF:   Your pain is not controlled with medicine.   You have new or worsening symptoms.  Your pain increases.   You have abdominal pain.   You have shortness of breath.   You have persistent nausea or vomiting.   You have swelling in your abdomen.   You feel faint or pass out.   You have blood in your urine.  You have a fever or persistent symptoms for more than 2-3 days.  You have a fever and your symptoms suddenly get worse. MAKE SURE YOU:  Understand these instructions.  Will watch your condition.  Will get help right away if you are not doing well or get worse. Document Released: 05/12/2005 Document Revised: 12/14/2011 Document Reviewed: 11/03/2011 [facility] Patient Information 2015 [place], [place]. This information is not intended to replace advice given to you by your health care provider. Make sure you discuss any questions  you have with your health care provider.

## 2014-01-27 NOTE — ED Notes (Signed)
Pt states she was here last week for vomiting, abd pain.  Pt here for same sx plus diarrhea. States she does not feel better and is having rt sided abd pain.

## 2014-01-27 NOTE — ED Provider Notes (Signed)
CSN: [id number]     Arrival date & time 01/27/14  4098 History   First MD Initiated Contact with Patient 01/27/14 [phone]     Chief Complaint  Patient presents with  . Abdominal Pain  . Nausea  . Emesis  . Diarrhea     (Consider location/radiation/quality/duration/timing/severity/associated sxs/prior Treatment) HPI Ms. Feeser is a 48 year old female past medical history of chronic diarrhea, chronic colitis, kidney stones who presents to the ER with 3 days of nausea, vomiting, diarrhea, right-sided abdominal pain. Patient states that she is seen in ER for her nausea and vomiting on 01/22/14, was treated in the ER, and was asymptomatic when she left. Patient states she felt better for approximately 2 days, then the diarrhea resumed, followed by nausea and vomiting, and a constant right-sided abdominal pain. Patient states her nausea and vomiting have persisted enough that she is unable to eat or drink anything by mouth, and feels she may be getting dehydrated. She states her diarrhea is watery in nature. Patient states her symptoms are consistent with her previous episodes of colitis, and she is unsure of her flank pain is more related to her colitis or a kidney stone, however it is not a new pain for her. Patient states her kidney stones that she is in the past feels similar to the pain she is experiencing today. Patient denies any weakness, dizziness, syncope, fever, chest pain, shortness of breath, dysuria.  Past Medical History  Diagnosis Date  . Collagenous colitis   . WPW (Wolff-Parkinson-White syndrome)   . Addison disease   . Thyroid disease   . Clostridium difficile diarrhea   . Anxiety and depression   . Chronic diarrhea   . Chronic back pain   . Drug-seeking behavior   . Irritable bowel syndrome (IBS)   . Anemia   . Pancreatitis   . Kidney stone   . Kidney stone   . Depression   . Anxiety   . Vaginal delivery [id number], 1998   Past Surgical History  Procedure Laterality  Date  . Cholecystectomy    . Tubal ligation    . Lithotripsy    . Sphincterotomy    . Cardiac electrophysiology mapping and ablation      for WPW  . Video bronchoscopy  08/31/2011    Procedure: VIDEO BRONCHOSCOPY WITH FLUORO;  Surgeon: Elsie Stain, MD;  Location: [facility];  Service: Cardiopulmonary;  Laterality: N/A;  . Vaginal hysterectomy  10/28/2013    Procedure: HYSTERECTOMY VAGINAL;  Surgeon: Emily Filbert, MD;  Location: [facility];  Service: Gynecology;;  . Salpingoophorectomy Bilateral 10/28/2013    Procedure: SALPINGO OOPHORECTOMY;  Surgeon: Emily Filbert, MD;  Location: [facility];  Service: Gynecology;  Laterality: Bilateral;  . Abdominal hysterectomy  10/28/2013   Family History  Problem Relation Age of Onset  . Prostate cancer Father   . Coronary artery disease Maternal Grandfather    History  Substance Use Topics  . Smoking status: Current Every Day Smoker -- 0.25 packs/day for 30 years    Types: Cigarettes  . Smokeless tobacco: Never Used  . Alcohol Use: Yes     Comment: seldom   OB History   Grav Para Term Preterm Abortions TAB SAB Ect Mult Living   3 3 3       3      Review of Systems  Constitutional: Negative for fever.  HENT: Negative for trouble swallowing.   Eyes: Negative for visual disturbance.  Respiratory: Negative for shortness of breath.  Cardiovascular: Negative for chest pain.  Gastrointestinal: Positive for nausea, vomiting and abdominal pain.  Genitourinary: Positive for flank pain. Negative for dysuria.  Musculoskeletal: Negative for neck pain.  Skin: Negative for rash.  Neurological: Negative for dizziness, weakness and numbness.  Psychiatric/Behavioral: Negative.       Allergies  Prednisone; Butrans; Nsaids; and Zofran  Home Medications   Prior to Admission medications   Medication Sig Start Date End Date Taking? Authorizing Provider  acetaminophen (TYLENOL) 325 MG tablet Take 650 mg by mouth every 6 (six) hours as needed for headache  (headache).   Yes Historical Provider, MD  escitalopram (LEXAPRO) 20 MG tablet Take 20 mg by mouth every morning.    Yes Historical Provider, MD  LORazepam (ATIVAN) 1 MG tablet Take 0.5-1 mg by mouth 2 (two) times daily as needed for anxiety (anxiety).    Yes Historical Provider, MD  Multiple Vitamin (MULITIVITAMIN WITH MINERALS) TABS Take 1 tablet by mouth every morning.    Yes Historical Provider, MD  potassium chloride (KLOR-CON M10) 10 MEQ tablet Take 10 mEq by mouth daily.    Yes Historical Provider, MD  promethazine (PHENERGAN) 25 MG tablet Take 25 mg by mouth every 6 (six) hours as needed for nausea or vomiting (nausea & vomiting).    Yes Historical Provider, MD  zolpidem (AMBIEN) 10 MG tablet Take 10 mg by mouth at bedtime as needed for sleep (sleep).    Yes Historical Provider, MD  ciprofloxacin (CIPRO) 500 MG tablet Take 1 tablet (500 mg total) by mouth 2 (two) times daily. One po bid x 7 days 01/27/14   Carrie Mew, PA-C  metroNIDAZOLE (FLAGYL) 500 MG tablet Take 1 tablet (500 mg total) by mouth 3 (three) times daily. One po bid x 7 days 01/27/14   Carrie Mew, PA-C   BP 133/99  Pulse 91  Temp(Src) 98.5 F (36.9 C) (Oral)  Resp 16  SpO2 100% Physical Exam  Nursing note and vitals reviewed. Constitutional: She is oriented to person, place, and time. She appears well-developed and well-nourished. No distress.  HENT:  Head: Normocephalic and atraumatic.  Mouth/Throat: Oropharynx is clear and moist. No oropharyngeal exudate.  Eyes: Right eye exhibits no discharge. Left eye exhibits no discharge. No scleral icterus.  Neck: Normal range of motion.  Cardiovascular: Normal rate, regular rhythm, S1 normal, S2 normal and normal heart sounds.   No murmur heard. Pulses:      Radial pulses are 2+ on the right side, and 2+ on the left side.  Pulmonary/Chest: Effort normal and breath sounds normal. No accessory muscle usage. Not tachypneic. No respiratory distress.  Abdominal: Soft.  Normal appearance and bowel sounds are normal. There is no tenderness. There is CVA tenderness. There is no rigidity, no guarding, no tenderness at McBurney's point and negative Murphy's sign.  Right-sided flank pain. Positive CVA tenderness on right.  Musculoskeletal: Normal range of motion. She exhibits no edema and no tenderness.  Neurological: She is alert and oriented to person, place, and time. No cranial nerve deficit. Coordination normal.  Skin: Skin is warm and dry. No rash noted. She is not diaphoretic.  Psychiatric: She has a normal mood and affect.    ED Course  Procedures (including critical care time) Labs Review Labs Reviewed  CBC WITH DIFFERENTIAL - Abnormal; Notable for the following:    RBC 3.75 (*)    MCV 101.9 (*)    MCH 35.7 (*)    All other components within normal limits  COMPREHENSIVE METABOLIC PANEL - Abnormal; Notable for the following:    Potassium 3.3 (*)    Glucose, Bld 101 (*)    Alkaline Phosphatase 133 (*)    All other components within normal limits  URINALYSIS, ROUTINE W REFLEX MICROSCOPIC - Abnormal; Notable for the following:    APPearance CLOUDY (*)    Hgb urine dipstick MODERATE (*)    All other components within normal limits  URINE MICROSCOPIC-ADD ON - Abnormal; Notable for the following:    Squamous Epithelial / LPF FEW (*)    Bacteria, UA FEW (*)    All other components within normal limits  LIPASE, BLOOD  PREGNANCY, URINE  GI PATHOGEN PANEL BY PCR, STOOL    Imaging Review US Renal  01/27/2014   CLINICAL DATA:  Right flank pain  EXAM: RENAL/URINARY TRACT ULTRASOUND COMPLETE  COMPARISON:  CT 08/07/2013  FINDINGS: Right Kidney:  Length: 9.8 cm. Nonobstructing renal calculi. 7.4 mm and 6.3 mm midpole calculi noted. Echogenicity within normal limits. No mass or hydronephrosis visualized.  Left Kidney:  Length: 10.7 cm. 5.6 mm left upper pole calculus. Additional 6 mm midpole calculus noted on CT. Echogenicity within normal limits. No mass or  hydronephrosis visualized.  Bladder:  Not well distended  IMPRESSION: Bilateral renal calculi without renal obstruction or mass.   Electronically Signed   By: Franchot Gallo M.D.   On: 01/27/2014 10:22     EKG Interpretation None      MDM   Final diagnoses:  Right flank pain  Colitis     48 year old female with nausea, vomiting, diarrhea, in the ER 5 days ago for same. Patient now reporting also some right flank pain which she states she was not experiencing on her last visit. Patient states she feels this may be related to a kidney stone, however she is more concerned about her diarrhea which has become worse and was 5 days ago. We will follow up with repeat labs, stool culture due to patient's past history of colitis, C. difficile, and follow-up with US renal for rule out a stone.   Patient moderate hemoglobin on UA, this could be indicative of a possible nephrolithiasis. Few bacteria noted on UA also, along with few squamous epithelial cells. Patient's denying any dysuria. I believe this may be due to a contaminated catch. Patient mildly hypokalemic, patient states 3.3 is actually a high level for her, and she states she is currently taking potassium by mouth at home. I encouraged her to continue this therapy.  US renal with impression: Bilateral renal calculi without renal obstruction or mass.  Patient is nontoxic, nonseptic appearing, in no apparent distress.  Patient's pain and other symptoms adequately managed in emergency department.  Fluid bolus given.  Labs, imaging and vitals reviewed.  Patient does not meet the SIRS or Sepsis criteria.  On repeat exam patient does not have a surgical abdomin and there are no peritoneal signs.  No indication of appendicitis, bowel obstruction, bowel perforation, cholecystitis, diverticulitis, PID or ectopic pregnancy.    I discussed these results with patient, and she states that typically when they do not find anything on renal exam, with this  chronic pain she experiences, a CT abdomen and pelvis is performed, and colitis is typically found. I discussed this option with patient today, and patient states she would prefer not to have another CT scan. Patient states that this is chronic pain for her, and she feels it is not any different than a typical presentation of what  she thinks may be just her colitis. We will discharge patient at this time, and treat her as a suspected colitis with antibiotics. I strongly recommended patient follow-up with Dr. Christoper Fabian who is her gastroenterologist and she typically sees when she experiences this pain. I also recommended patient follow-up with a primary care provider, and patient states she is in the process of getting one at this time. I also discussed return precautions with patient, and she was agreeable to this plan. Patient stating that she does not wish to go home with any pain medications, and states that she has some Percocet at home from a previous kidney stone that she can take if her pain becomes uncontrolled.   BP 133/99  Pulse 91  Temp(Src) 98.5 F (36.9 C) (Oral)  Resp 16  SpO2 100%  Signed,  Dahlia Bailiff, PA-C 5:43 PM  This patient seen and discussed with Dr. Lacretia Leigh, M.D.  Carrie Mew, PA-C 01/27/14 1743

## 2014-01-27 NOTE — ED Notes (Signed)
PA at bedside.

## 2014-01-28 LAB — GI PATHOGEN PANEL BY PCR, STOOL
C difficile toxin A/B: NEGATIVE
CAMPYLOBACTER BY PCR: NEGATIVE
Cryptosporidium by PCR: NEGATIVE
E COLI 0157 BY PCR: NEGATIVE
E coli (ETEC) LT/ST: NEGATIVE
E coli (STEC): NEGATIVE
G lamblia by PCR: NEGATIVE
Norovirus GI/GII: NEGATIVE
ROTAVIRUS A BY PCR: NEGATIVE
Salmonella by PCR: NEGATIVE
Shigella by PCR: NEGATIVE

## 2014-01-29 NOTE — ED Provider Notes (Signed)
Medical screening examination/treatment/procedure(s) were conducted as a shared visit with non-physician practitioner(s) and myself.  I personally evaluated the patient during the encounter.   EKG Interpretation None       Leota Jacobsen, MD 01/29/14 1015

## 2014-02-03 ENCOUNTER — Encounter (HOSPITAL_COMMUNITY): Payer: Self-pay | Admitting: Emergency Medicine

## 2014-02-12 ENCOUNTER — Emergency Department (HOSPITAL_COMMUNITY)
Admission: EM | Admit: 2014-02-12 | Discharge: 2014-02-12 | Disposition: A | Discharge status: Home / Self Care | Payer: BC Managed Care – PPO | Attending: Emergency Medicine | Admitting: Emergency Medicine

## 2014-02-12 ENCOUNTER — Encounter (HOSPITAL_COMMUNITY): Payer: Self-pay | Admitting: Emergency Medicine

## 2014-02-12 ENCOUNTER — Emergency Department (HOSPITAL_COMMUNITY): Payer: BC Managed Care – PPO

## 2014-02-12 DIAGNOSIS — D649 Anemia, unspecified: Secondary | ICD-10-CM | POA: Insufficient documentation

## 2014-02-12 DIAGNOSIS — Z8679 Personal history of other diseases of the circulatory system: Secondary | ICD-10-CM | POA: Diagnosis not present

## 2014-02-12 DIAGNOSIS — R109 Unspecified abdominal pain: Secondary | ICD-10-CM

## 2014-02-12 DIAGNOSIS — Z8719 Personal history of other diseases of the digestive system: Secondary | ICD-10-CM | POA: Diagnosis not present

## 2014-02-12 DIAGNOSIS — Z87442 Personal history of urinary calculi: Secondary | ICD-10-CM | POA: Insufficient documentation

## 2014-02-12 DIAGNOSIS — F329 Major depressive disorder, single episode, unspecified: Secondary | ICD-10-CM | POA: Insufficient documentation

## 2014-02-12 DIAGNOSIS — R112 Nausea with vomiting, unspecified: Secondary | ICD-10-CM | POA: Diagnosis not present

## 2014-02-12 DIAGNOSIS — Z8619 Personal history of other infectious and parasitic diseases: Secondary | ICD-10-CM | POA: Insufficient documentation

## 2014-02-12 DIAGNOSIS — Z8639 Personal history of other endocrine, nutritional and metabolic disease: Secondary | ICD-10-CM | POA: Diagnosis not present

## 2014-02-12 DIAGNOSIS — F419 Anxiety disorder, unspecified: Secondary | ICD-10-CM | POA: Insufficient documentation

## 2014-02-12 DIAGNOSIS — Z90710 Acquired absence of both cervix and uterus: Secondary | ICD-10-CM | POA: Insufficient documentation

## 2014-02-12 DIAGNOSIS — Z792 Long term (current) use of antibiotics: Secondary | ICD-10-CM | POA: Insufficient documentation

## 2014-02-12 DIAGNOSIS — Z9851 Tubal ligation status: Secondary | ICD-10-CM | POA: Insufficient documentation

## 2014-02-12 DIAGNOSIS — Z9089 Acquired absence of other organs: Secondary | ICD-10-CM | POA: Diagnosis not present

## 2014-02-12 DIAGNOSIS — Z79899 Other long term (current) drug therapy: Secondary | ICD-10-CM | POA: Insufficient documentation

## 2014-02-12 DIAGNOSIS — G8929 Other chronic pain: Secondary | ICD-10-CM | POA: Insufficient documentation

## 2014-02-12 DIAGNOSIS — Z720 Tobacco use: Secondary | ICD-10-CM | POA: Diagnosis not present

## 2014-02-12 DIAGNOSIS — R1031 Right lower quadrant pain: Secondary | ICD-10-CM | POA: Insufficient documentation

## 2014-02-12 LAB — CBC WITH DIFFERENTIAL/PLATELET
Basophils Absolute: 0.1 10*3/uL (ref 0.0–0.1)
Basophils Relative: 1 % (ref 0–1)
Eosinophils Absolute: 0.2 10*3/uL (ref 0.0–0.7)
Eosinophils Relative: 2 % (ref 0–5)
HCT: 39.3 % (ref 36.0–46.0)
Hemoglobin: 13.3 g/dL (ref 12.0–15.0)
Lymphocytes Relative: 19 % (ref 12–46)
Lymphs Abs: 1.4 10*3/uL (ref 0.7–4.0)
MCH: 35.2 pg — ABNORMAL HIGH (ref 26.0–34.0)
MCHC: 33.8 g/dL (ref 30.0–36.0)
MCV: 104 fL — ABNORMAL HIGH (ref 78.0–100.0)
Monocytes Absolute: 0.4 10*3/uL (ref 0.1–1.0)
Monocytes Relative: 5 % (ref 3–12)
Neutro Abs: 5.6 10*3/uL (ref 1.7–7.7)
Neutrophils Relative %: 73 % (ref 43–77)
Platelets: 289 10*3/uL (ref 150–400)
RBC: 3.78 MIL/uL — ABNORMAL LOW (ref 3.87–5.11)
RDW: 14.1 % (ref 11.5–15.5)
WBC: 7.7 10*3/uL (ref 4.0–10.5)

## 2014-02-12 LAB — URINALYSIS, ROUTINE W REFLEX MICROSCOPIC
Bilirubin Urine: NEGATIVE
Glucose, UA: NEGATIVE mg/dL
Ketones, ur: NEGATIVE mg/dL
Leukocytes, UA: NEGATIVE
Nitrite: NEGATIVE
Protein, ur: NEGATIVE mg/dL
Specific Gravity, Urine: 1.018 (ref 1.005–1.030)
Urobilinogen, UA: 0.2 mg/dL (ref 0.0–1.0)
pH: 5.5 (ref 5.0–8.0)

## 2014-02-12 LAB — COMPREHENSIVE METABOLIC PANEL
ALT: 15 U/L (ref 0–35)
AST: 18 U/L (ref 0–37)
Albumin: 3.9 g/dL (ref 3.5–5.2)
Alkaline Phosphatase: 102 U/L (ref 39–117)
Anion gap: 14 (ref 5–15)
BUN: 16 mg/dL (ref 6–23)
CO2: 22 mEq/L (ref 19–32)
Calcium: 9.1 mg/dL (ref 8.4–10.5)
Chloride: 108 mEq/L (ref 96–112)
Creatinine, Ser: 0.61 mg/dL (ref 0.50–1.10)
GFR calc Af Amer: >90 mL/min (ref >90)
GFR calc non Af Amer: >90 mL/min (ref >90)
Glucose, Bld: 90 mg/dL (ref 70–99)
Potassium: 4.3 mEq/L (ref 3.7–5.3)
Sodium: 144 mEq/L (ref 137–147)
Total Bilirubin: 0.4 mg/dL (ref 0.3–1.2)
Total Protein: 7.2 g/dL (ref 6.0–8.3)

## 2014-02-12 LAB — URINE MICROSCOPIC-ADD ON

## 2014-02-12 LAB — LIPASE, BLOOD: Lipase: 22 U/L (ref 11–59)

## 2014-02-12 MED ORDER — SODIUM CHLORIDE 0.9 % IV BOLUS (SEPSIS)
1000.0000 mL | Freq: Once | INTRAVENOUS | Status: AC
  Administered 2014-02-12: 1000 mL via INTRAVENOUS

## 2014-02-12 MED ORDER — PROMETHAZINE HCL 25 MG PO TABS: 25.0000 mg | ORAL_TABLET | Freq: Four times a day (QID) | ORAL | Status: DC | PRN

## 2014-02-12 MED ORDER — HYDROCODONE-ACETAMINOPHEN 5-325 MG PO TABS: 1.0000 | ORAL_TABLET | Freq: Four times a day (QID) | ORAL | Status: DC | PRN

## 2014-02-12 MED ORDER — HYDROMORPHONE HCL 1 MG/ML IJ SOLN
1.0000 mg | Freq: Once | INTRAMUSCULAR | Status: AC
  Administered 2014-02-12: 1 mg via INTRAVENOUS
  Filled 2014-02-12: qty 1

## 2014-02-12 MED ORDER — METOCLOPRAMIDE HCL 5 MG/ML IJ SOLN
10.0000 mg | Freq: Once | INTRAMUSCULAR | Status: AC
  Administered 2014-02-12: 10 mg via INTRAVENOUS
  Filled 2014-02-12: qty 2

## 2014-02-12 MED ORDER — PROMETHAZINE HCL 25 MG/ML IJ SOLN
25.0000 mg | Freq: Once | INTRAMUSCULAR | Status: AC
  Administered 2014-02-12: 25 mg via INTRAVENOUS
  Filled 2014-02-12: qty 1

## 2014-02-12 NOTE — ED Provider Notes (Signed)
CSN: [id number]     Arrival date & time 02/12/14  1056 History   First MD Initiated Contact with Patient 02/12/14 1113     Chief Complaint  Patient presents with  . Flank Pain     (Consider location/radiation/quality/duration/timing/severity/associated sxs/prior Treatment) HPI Comments: Patient with a history of Kidney Stones presents today with a chief complaint of right flank pain.  Pain radiates to the right lower quadrant of her abdomen.  She reports that the pain has been intermittent since yesterday, but has been more constant today.  She describes the pain as a "sharp, gripping" pain.  She states that the pain feels similar to the pain that she has had with Kidney stones in the past.  She reports that the pain is associated with 3-4 episodes of vomiting today.  She has taken Tylenol for the pain without relief.  She denies fever, chills, dysuria, hematuria, constipation, or vaginal discharge.  She does report associated urgency and frequency.  She also reports that she has chronic diarrhea.    Patient is a 48 y.o. female presenting with flank pain. The history is provided by the patient.  Flank Pain Associated symptoms include abdominal pain, nausea and vomiting.    Past Medical History  Diagnosis Date  . Collagenous colitis   . WPW (Wolff-Parkinson-White syndrome)   . Addison disease   . Thyroid disease   . Clostridium difficile diarrhea   . Anxiety and depression   . Chronic diarrhea   . Chronic back pain   . Drug-seeking behavior   . Irritable bowel syndrome (IBS)   . Anemia   . Pancreatitis   . Kidney stone   . Kidney stone   . Depression   . Anxiety   . Vaginal delivery [id number], 1998   Past Surgical History  Procedure Laterality Date  . Cholecystectomy    . Tubal ligation    . Lithotripsy    . Sphincterotomy    . Cardiac electrophysiology mapping and ablation      for WPW  . Video bronchoscopy  08/31/2011    Procedure: VIDEO BRONCHOSCOPY WITH FLUORO;   Surgeon: Elsie Stain, MD;  Location: [facility];  Service: Cardiopulmonary;  Laterality: N/A;  . Vaginal hysterectomy  10/28/2013    Procedure: HYSTERECTOMY VAGINAL;  Surgeon: Emily Filbert, MD;  Location: [facility];  Service: Gynecology;;  . Salpingoophorectomy Bilateral 10/28/2013    Procedure: SALPINGO OOPHORECTOMY;  Surgeon: Emily Filbert, MD;  Location: [facility];  Service: Gynecology;  Laterality: Bilateral;  . Abdominal hysterectomy  10/28/2013   Family History  Problem Relation Age of Onset  . Prostate cancer Father   . Coronary artery disease Maternal Grandfather    History  Substance Use Topics  . Smoking status: Current Every Day Smoker -- 0.25 packs/day for 30 years    Types: Cigarettes  . Smokeless tobacco: Never Used  . Alcohol Use: Yes     Comment: seldom   OB History    Gravida Para Term Preterm AB TAB SAB Ectopic Multiple Living   3 3 3       3      Review of Systems  Gastrointestinal: Positive for nausea, vomiting and abdominal pain.  Genitourinary: Positive for flank pain.  All other systems reviewed and are negative.     Allergies  Prednisone; Butrans; Nsaids; and Zofran  Home Medications   Prior to Admission medications   Medication Sig Start Date End Date Taking? Authorizing Provider  acetaminophen (TYLENOL) 325 MG tablet  Take 650 mg by mouth every 6 (six) hours as needed for headache (headache).   Yes Historical Provider, MD  escitalopram (LEXAPRO) 20 MG tablet Take 20 mg by mouth every morning.    Yes Historical Provider, MD  LORazepam (ATIVAN) 1 MG tablet Take 0.5-1 mg by mouth 2 (two) times daily as needed for anxiety (anxiety).    Yes Historical Provider, MD  Multiple Vitamin (MULITIVITAMIN WITH MINERALS) TABS Take 1 tablet by mouth every morning.    Yes Historical Provider, MD  potassium chloride (KLOR-CON M10) 10 MEQ tablet Take 10 mEq by mouth daily.    Yes Historical Provider, MD  promethazine (PHENERGAN) 25 MG tablet Take 25 mg by mouth every 6  (six) hours as needed for nausea or vomiting (nausea & vomiting).    Yes Historical Provider, MD  zolpidem (AMBIEN) 10 MG tablet Take 10 mg by mouth at bedtime as needed for sleep (sleep).    Yes Historical Provider, MD  ciprofloxacin (CIPRO) 500 MG tablet Take 1 tablet (500 mg total) by mouth 2 (two) times daily. One po bid x 7 days 01/27/14   Carrie Mew, PA-C  metroNIDAZOLE (FLAGYL) 500 MG tablet Take 1 tablet (500 mg total) by mouth 3 (three) times daily. One po bid x 7 days 01/27/14   Carrie Mew, PA-C   BP 146/106 mmHg  Pulse 105  Temp(Src) 98.7 F (37.1 C) (Oral)  Resp 18  SpO2 100% Physical Exam  Constitutional: She appears well-developed and well-nourished.  HENT:  Head: Normocephalic and atraumatic.  Mouth/Throat: Oropharynx is clear and moist.  Neck: Normal range of motion. Neck supple.  Cardiovascular: Normal rate, regular rhythm and normal heart sounds.   Pulmonary/Chest: Effort normal and breath sounds normal. No respiratory distress. She has no wheezes. She has no rales.  Abdominal: Soft. Bowel sounds are normal. She exhibits no distension and no mass. There is no rebound and no guarding.  Right CVA tenderness  Musculoskeletal: Normal range of motion.  Neurological: She is alert.  Skin: Skin is warm and dry. She is not diaphoretic.  Psychiatric: She has a normal mood and affect.  Nursing note and vitals reviewed.   ED Course  Procedures (including critical care time) Labs Review Labs Reviewed  CBC WITH DIFFERENTIAL  COMPREHENSIVE METABOLIC PANEL  LIPASE, BLOOD  URINALYSIS, ROUTINE W REFLEX MICROSCOPIC    Imaging Review Ct Renal Stone Study  02/12/2014   CLINICAL DATA:  Right flank pain with nausea and vomiting  EXAM: CT ABDOMEN AND PELVIS WITHOUT CONTRAST  TECHNIQUE: Multidetector CT imaging of the abdomen and pelvis was performed following the standard protocol without oral or intravenous contrast material administration.  COMPARISON:  CT abdomen and  pelvis Aug 07, 2013  FINDINGS: There is mild bibasilar atelectatic change. Lung bases are otherwise clear.  No focal liver lesions are identified on this noncontrast enhanced study. Gallbladder is absent. The common bile duct appears within normal limits for postcholecystectomy state. No mass or calculus is seen in the biliary ductal system.  Spleen, pancreas, and adrenals appear normal.  There is a 2 mm calculus in the upper pole of the right kidney. There is a nearby 6 x 2 mm calculus in the upper pole of the right kidney. There is a 1 mm calculus with a nearby 5 mm calculus in the mid right kidney. There is a 1 mm lower pole right renal calculus. There is no hydronephrosis or renal mass on the right. There is no appreciable ureteral calculus  on the right. On the left, there is a 3 mm calculus in the upper to midpole region. There is no left renal mass or hydronephrosis. There is no left-sided ureteral calculus.  In the pelvis, the urinary bladder is midline with normal wall thickness. There is no pelvic mass or fluid collection. Uterus is absent.  The appendix appears normal.  There is no bowel obstruction. No free air or portal venous air. There is no ascites, adenopathy, or abscess in the abdomen or pelvis. There is atherosclerotic change in the aorta but no aneurysm. There are no blastic or lytic bone lesions.  IMPRESSION: Intrarenal calculi bilaterally, more on the right than on the left. No hydronephrosis on either side. No ureteral calculi.  Appendix appears normal.  No bowel obstruction.  No abscess.  Gallbladder absent.  Uterus absent.   Electronically Signed   By: Lowella Grip M.D.   On: 02/12/2014 12:47     EKG Interpretation None     3:00 PM Reassessed patient.  She reports improvement in pain and nausea.  Patient tolerating PO liquids.  Abdomen soft with mild tenderness of the RLQ and right flank.  No rebound or guarding.   MDM   Final diagnoses:  Right flank pain   Patient with a  history of Kidney Stones presents today with right flank pain radiating to the right lower abdomen   She reports that the pain feels similar to pain that she has had with kidney stones in the past.  UA shows hemoglobin, but no signs of infection.  Labs unremarkable.  CT renal stone study shows intrarenal calculi bilaterally, but no ureteral calculi.  No hydronephrosis.  Pain and nausea controlled at time of discharge.  Patient tolerating PO liquids. Patient stable for discharge.  Return precautions given.    Hyman Bible, PA-C 02/13/14 2106  Virgel Manifold, MD 02/14/14 [phone]

## 2014-02-12 NOTE — ED Notes (Signed)
PA Bedside

## 2014-02-12 NOTE — ED Notes (Signed)
Pt c/o rt flank pain since yesterday w/ NV.  Denies dysuria.

## 2014-02-18 ENCOUNTER — Encounter: Payer: Self-pay | Admitting: Nurse Practitioner

## 2014-02-18 ENCOUNTER — Other Ambulatory Visit: Payer: Self-pay | Admitting: *Deleted

## 2014-02-18 NOTE — Telephone Encounter (Signed)
Pt called requesting a RF on Fioricet for her headaches.  Pt was seen in ED @ [facility] 02/13/15 and was given Vicodin  Spoke with Monna Fam, NP who denied pt's request for Fioricet due to getting the recent prescription for Vicodin.  Pt states she does not understand why because they are different meds.  Explained that they were still in same class of meds.  Pt stated that [employer] lost her bags in [place] once and she was told that Fioricet was not a narcotic.  Pt became agitated and hung the phone up.

## 2014-03-10 ENCOUNTER — Encounter (HOSPITAL_COMMUNITY): Payer: Self-pay | Admitting: Emergency Medicine

## 2014-03-10 ENCOUNTER — Other Ambulatory Visit: Payer: Self-pay

## 2014-03-10 ENCOUNTER — Emergency Department
Admission: EM | Admit: 2014-03-10 | Discharge: 2014-03-10 | Disposition: A | Discharge status: Other Acute Inpatient Hospital | Payer: BC Managed Care – PPO | Source: Home / Self Care | Attending: Family Medicine | Admitting: Family Medicine

## 2014-03-10 ENCOUNTER — Emergency Department (HOSPITAL_COMMUNITY)
Admission: EM | Admit: 2014-03-10 | Discharge: 2014-03-10 | Disposition: A | Discharge status: Home / Self Care | Payer: BC Managed Care – PPO | Attending: Emergency Medicine | Admitting: Emergency Medicine

## 2014-03-10 ENCOUNTER — Emergency Department (INDEPENDENT_AMBULATORY_CARE_PROVIDER_SITE_OTHER): Payer: BC Managed Care – PPO

## 2014-03-10 ENCOUNTER — Other Ambulatory Visit: Payer: Self-pay | Admitting: Nurse Practitioner

## 2014-03-10 ENCOUNTER — Encounter: Payer: Self-pay | Admitting: Emergency Medicine

## 2014-03-10 DIAGNOSIS — R0781 Pleurodynia: Secondary | ICD-10-CM | POA: Insufficient documentation

## 2014-03-10 DIAGNOSIS — G8929 Other chronic pain: Secondary | ICD-10-CM | POA: Diagnosis not present

## 2014-03-10 DIAGNOSIS — F329 Major depressive disorder, single episode, unspecified: Secondary | ICD-10-CM | POA: Diagnosis not present

## 2014-03-10 DIAGNOSIS — K589 Irritable bowel syndrome without diarrhea: Secondary | ICD-10-CM | POA: Insufficient documentation

## 2014-03-10 DIAGNOSIS — R05 Cough: Secondary | ICD-10-CM | POA: Diagnosis present

## 2014-03-10 DIAGNOSIS — Z8619 Personal history of other infectious and parasitic diseases: Secondary | ICD-10-CM | POA: Insufficient documentation

## 2014-03-10 DIAGNOSIS — Z87442 Personal history of urinary calculi: Secondary | ICD-10-CM | POA: Insufficient documentation

## 2014-03-10 DIAGNOSIS — R509 Fever, unspecified: Secondary | ICD-10-CM

## 2014-03-10 DIAGNOSIS — F419 Anxiety disorder, unspecified: Secondary | ICD-10-CM | POA: Diagnosis not present

## 2014-03-10 DIAGNOSIS — Z720 Tobacco use: Secondary | ICD-10-CM | POA: Diagnosis not present

## 2014-03-10 DIAGNOSIS — R059 Cough, unspecified: Secondary | ICD-10-CM

## 2014-03-10 DIAGNOSIS — J3489 Other specified disorders of nose and nasal sinuses: Secondary | ICD-10-CM | POA: Diagnosis not present

## 2014-03-10 DIAGNOSIS — Z8679 Personal history of other diseases of the circulatory system: Secondary | ICD-10-CM | POA: Insufficient documentation

## 2014-03-10 DIAGNOSIS — R0789 Other chest pain: Secondary | ICD-10-CM | POA: Insufficient documentation

## 2014-03-10 DIAGNOSIS — Z8639 Personal history of other endocrine, nutritional and metabolic disease: Secondary | ICD-10-CM | POA: Insufficient documentation

## 2014-03-10 DIAGNOSIS — R079 Chest pain, unspecified: Secondary | ICD-10-CM

## 2014-03-10 DIAGNOSIS — Z862 Personal history of diseases of the blood and blood-forming organs and certain disorders involving the immune mechanism: Secondary | ICD-10-CM | POA: Insufficient documentation

## 2014-03-10 DIAGNOSIS — Z79899 Other long term (current) drug therapy: Secondary | ICD-10-CM | POA: Insufficient documentation

## 2014-03-10 DIAGNOSIS — R0602 Shortness of breath: Secondary | ICD-10-CM | POA: Diagnosis not present

## 2014-03-10 DIAGNOSIS — R0981 Nasal congestion: Secondary | ICD-10-CM | POA: Diagnosis not present

## 2014-03-10 LAB — CBC
HCT: 37.1 % (ref 36.0–46.0)
Hemoglobin: 12.3 g/dL (ref 12.0–15.0)
MCH: 35 pg — AB (ref 26.0–34.0)
MCHC: 33.2 g/dL (ref 30.0–36.0)
MCV: 105.7 fL — ABNORMAL HIGH (ref 78.0–100.0)
Platelets: 256 10*3/uL (ref 150–400)
RBC: 3.51 MIL/uL — ABNORMAL LOW (ref 3.87–5.11)
RDW: 13.2 % (ref 11.5–15.5)
WBC: 10.7 10*3/uL — ABNORMAL HIGH (ref 4.0–10.5)

## 2014-03-10 LAB — BASIC METABOLIC PANEL
ANION GAP: 12 (ref 5–15)
BUN: 12 mg/dL (ref 6–23)
CO2: 26 mEq/L (ref 19–32)
Calcium: 9.1 mg/dL (ref 8.4–10.5)
Chloride: 106 mEq/L (ref 96–112)
Creatinine, Ser: 0.72 mg/dL (ref 0.50–1.10)
GFR calc Af Amer: >90 mL/min (ref >90)
Glucose, Bld: 93 mg/dL (ref 70–99)
POTASSIUM: 4.4 meq/L (ref 3.7–5.3)
SODIUM: 144 meq/L (ref 137–147)

## 2014-03-10 LAB — I-STAT TROPONIN, ED: TROPONIN I, POC: 0 ng/mL (ref 0.00–0.08)

## 2014-03-10 LAB — PRO B NATRIURETIC PEPTIDE: Pro B Natriuretic peptide (BNP): 227.7 pg/mL — ABNORMAL HIGH (ref 0–125)

## 2014-03-10 MED ORDER — ALBUTEROL SULFATE HFA 108 (90 BASE) MCG/ACT IN AERS: 1.0000 | INHALATION_SPRAY | Freq: Four times a day (QID) | RESPIRATORY_TRACT | Status: DC | PRN

## 2014-03-10 MED ORDER — PROMETHAZINE HCL 25 MG PO TABS: 25.0000 mg | ORAL_TABLET | Freq: Four times a day (QID) | ORAL | Status: DC | PRN

## 2014-03-10 MED ORDER — BENZONATATE 100 MG PO CAPS: 100.0000 mg | ORAL_CAPSULE | Freq: Three times a day (TID) | ORAL | Status: DC

## 2014-03-10 MED ORDER — OXYCODONE-ACETAMINOPHEN 5-325 MG PO TABS
1.0000 | ORAL_TABLET | Freq: Once | ORAL | Status: AC
  Administered 2014-03-10: 1 via ORAL
  Filled 2014-03-10: qty 1

## 2014-03-10 MED ORDER — PROMETHAZINE HCL 25 MG PO TABS
25.0000 mg | ORAL_TABLET | Freq: Once | ORAL | Status: AC
  Administered 2014-03-10: 25 mg via ORAL
  Filled 2014-03-10: qty 1

## 2014-03-10 NOTE — Discharge Instructions (Signed)
Take the prescribed medication as directed. Follow-up with cardiology-- call to schedule appt. Return to the ED for new or worsening symptoms.

## 2014-03-10 NOTE — ED Provider Notes (Signed)
Dawn Frost is a 48 y.o. female who presents to Urgent Care today for cough sore throat and chest pain. Patient has a several day history of cough and congestion. She denies a sore throat sneezing. However she notes central chest pain. She feels as though there is a giraffe sitting on her chest. The pain is worse with cough and exertion. She also notes shortness of breath on exertion. She denies any wheezing. The chest pressure does not radiate. No fevers or chills vomiting or diarrhea. She has a history of Wolff-Parkinson-White syndrome that has resolved after ablation. She has tried some over-the-counter medications for her symptoms that have not helped. She denies any leg swelling or orthopnea.   Past Medical History  Diagnosis Date  . Collagenous colitis   . WPW (Wolff-Parkinson-White syndrome)   . Addison disease   . Thyroid disease   . Clostridium difficile diarrhea   . Anxiety and depression   . Chronic diarrhea   . Chronic back pain   . Drug-seeking behavior   . Irritable bowel syndrome (IBS)   . Anemia   . Pancreatitis   . Kidney stone   . Kidney stone   . Depression   . Anxiety   . Vaginal delivery 1735,6701, 1998   Past Surgical History  Procedure Laterality Date  . Cholecystectomy    . Tubal ligation    . Lithotripsy    . Sphincterotomy    . Cardiac electrophysiology mapping and ablation      for WPW  . Video bronchoscopy  08/31/2011    Procedure: VIDEO BRONCHOSCOPY WITH FLUORO;  Surgeon: Elsie Stain, MD;  Location: [facility];  Service: Cardiopulmonary;  Laterality: N/A;  . Vaginal hysterectomy  10/28/2013    Procedure: HYSTERECTOMY VAGINAL;  Surgeon: Emily Filbert, MD;  Location: [facility];  Service: Gynecology;;  . Salpingoophorectomy Bilateral 10/28/2013    Procedure: SALPINGO OOPHORECTOMY;  Surgeon: Emily Filbert, MD;  Location: [facility];  Service: Gynecology;  Laterality: Bilateral;  . Abdominal hysterectomy  10/28/2013   History  Substance Use Topics  .  Smoking status: Current Every Day Smoker -- 0.25 packs/day for 30 years    Types: Cigarettes  . Smokeless tobacco: Never Used  . Alcohol Use: Yes     Comment: seldom   ROS as above Medications: No current facility-administered medications for this encounter.   Current Outpatient Prescriptions  Medication Sig Dispense Refill  . acetaminophen (TYLENOL) 325 MG tablet Take 650 mg by mouth every 6 (six) hours as needed for headache (headache).    . ciprofloxacin (CIPRO) 500 MG tablet Take 1 tablet (500 mg total) by mouth 2 (two) times daily. One po bid x 7 days 14 tablet 0  . escitalopram (LEXAPRO) 20 MG tablet Take 20 mg by mouth every morning.     Marland Kitchen HYDROcodone-acetaminophen (NORCO/VICODIN) 5-325 MG per tablet Take 1-2 tablets by mouth every 6 (six) hours as needed. 10 tablet 0  . LORazepam (ATIVAN) 1 MG tablet Take 0.5-1 mg by mouth 2 (two) times daily as needed for anxiety (anxiety).     . metroNIDAZOLE (FLAGYL) 500 MG tablet Take 1 tablet (500 mg total) by mouth 3 (three) times daily. One po bid x 7 days 21 tablet 0  . Multiple Vitamin (MULITIVITAMIN WITH MINERALS) TABS Take 1 tablet by mouth every morning.     . potassium chloride (KLOR-CON M10) 10 MEQ tablet Take 10 mEq by mouth daily.     . promethazine (PHENERGAN) 25 MG tablet Take  1 tablet (25 mg total) by mouth every 6 (six) hours as needed for nausea. 20 tablet 0  . zolpidem (AMBIEN) 10 MG tablet Take 10 mg by mouth at bedtime as needed for sleep (sleep).      Allergies  Allergen Reactions  . Prednisone Other (See Comments)    Hyperactivity and agitation  . Butrans [Buprenorphine] Other (See Comments)    Burned skin  . Nsaids Other (See Comments)    Bad for colitis  . Zofran Other (See Comments)    Headache      Exam:  BP 127/86 mmHg  Pulse 74  Temp(Src) 98.3 F (36.8 C) (Oral)  Resp 18  SpO2 100%  LMP 08/06/2013 Gen: Well NAD HEENT: EOMI,  MMM no JVD  Lungs: Normal work of breathing. CTABL Heart: RRR no  MRG Abd: NABS, Soft. Nondistended, Nontender Exts: Brisk capillary refill, warm and well perfused. No edema bilateral lower extremities.   twelve-lead EKG shows normal sinus rhythm at 67 bpm. No ST segment elevation or depression. Low voltage in the limb leads. Normal EKG otherwise. No Q waves.  No results found for this or any previous visit (from the past 24 hour(s)). Dg Chest 2 View  03/10/2014   CLINICAL DATA:  Cough and fever and body aches.  Chest pain.  EXAM: CHEST  2 VIEW  COMPARISON:  01/22/2014  FINDINGS: The heart size and mediastinal contours are within normal limits. Both lungs are clear. The visualized skeletal structures are unremarkable.  IMPRESSION: Normal exam.   Electronically Signed   By: Rozetta Nunnery M.D.   On: 03/10/2014 10:49    Assessment and Plan: 48 y.o. female with  Chest pain. Patient has central exertional chest pain. This is concerning for cardiac etiology. Recommended  transport to the emergency department. She will go there now.. I recommended EMS transport. Patient declined.  Discussed warning signs or symptoms. Please see discharge instructions. Patient expresses understanding.     Gregor Hams, MD 03/10/14 [phone]

## 2014-03-10 NOTE — Discharge Instructions (Signed)
Thank you for coming in today.  Go directly to the emergency room  Chest Pain (Nonspecific) It is often hard to give a specific diagnosis for the cause of chest pain. There is always a chance that your pain could be related to something serious, such as a heart attack or a blood clot in the lungs. You need to follow up with your health care provider for further evaluation. CAUSES   Heartburn.  Pneumonia or bronchitis.  Anxiety or stress.  Inflammation around your heart (pericarditis) or lung (pleuritis or pleurisy).  A blood clot in the lung.  A collapsed lung (pneumothorax). It can develop suddenly on its own (spontaneous pneumothorax) or from trauma to the chest.  Shingles infection (herpes zoster virus). The chest wall is composed of bones, muscles, and cartilage. Any of these can be the source of the pain.  The bones can be bruised by injury.  The muscles or cartilage can be strained by coughing or overwork.  The cartilage can be affected by inflammation and become sore (costochondritis). DIAGNOSIS  Lab tests or other studies may be needed to find the cause of your pain. Your health care provider may have you take a test called an ambulatory electrocardiogram (ECG). An ECG records your heartbeat patterns over a 24-hour period. You may also have other tests, such as:  Transthoracic echocardiogram (TTE). During echocardiography, sound waves are used to evaluate how blood flows through your heart.  Transesophageal echocardiogram (TEE).  Cardiac monitoring. This allows your health care provider to monitor your heart rate and rhythm in real time.  Holter monitor. This is a portable device that records your heartbeat and can help diagnose heart arrhythmias. It allows your health care provider to track your heart activity for several days, if needed.  Stress tests by exercise or by giving medicine that makes the heart beat faster. TREATMENT   Treatment depends on what may be  causing your chest pain. Treatment may include:  Acid blockers for heartburn.  Anti-inflammatory medicine.  Pain medicine for inflammatory conditions.  Antibiotics if an infection is present.  You may be advised to change lifestyle habits. This includes stopping smoking and avoiding alcohol, caffeine, and chocolate.  You may be advised to keep your head raised (elevated) when sleeping. This reduces the chance of acid going backward from your stomach into your esophagus. Most of the time, nonspecific chest pain will improve within 2-3 days with rest and mild pain medicine.  HOME CARE INSTRUCTIONS   If antibiotics were prescribed, take them as directed. Finish them even if you start to feel better.  For the next few days, avoid physical activities that bring on chest pain. Continue physical activities as directed.  Do not use any tobacco products, including cigarettes, chewing tobacco, or electronic cigarettes.  Avoid drinking alcohol.  Only take medicine as directed by your health care provider.  Follow your health care provider's suggestions for further testing if your chest pain does not go away.  Keep any follow-up appointments you made. If you do not go to an appointment, you could develop lasting (chronic) problems with pain. If there is any problem keeping an appointment, call to reschedule. SEEK MEDICAL CARE IF:   Your chest pain does not go away, even after treatment.  You have a rash with blisters on your chest.  You have a fever. SEEK IMMEDIATE MEDICAL CARE IF:   You have increased chest pain or pain that spreads to your arm, neck, jaw, back, or abdomen.  You have shortness of breath.  You have an increasing cough, or you cough up blood.  You have severe back or abdominal pain.  You feel nauseous or vomit.  You have severe weakness.  You faint.  You have chills. This is an emergency. Do not wait to see if the pain will go away. Get medical help at once.  Call your local emergency services (911 in [place] Do not drive yourself to the hospital. MAKE SURE YOU:   Understand these instructions.  Will watch your condition.  Will get help right away if you are not doing well or get worse. Document Released: 12/29/2004 Document Revised: 03/26/2013 Document Reviewed: 10/25/2007 [facility] Patient Information 2015 [place], [place]. This information is not intended to replace advice given to you by your health care provider. Make sure you discuss any questions you have with your health care provider.

## 2014-03-10 NOTE — ED Notes (Signed)
Reports 3 days of congestion, cough leading to pressure in chest, fatigue from coughing, headache, body aches and some shortness of breath.

## 2014-03-10 NOTE — ED Notes (Signed)
Per pt, was at urgent care in [place], states increased cough and she thought she has PNA-chest xray was negative although MD concerned about EKG and told patient to come here for further testing-pateint states chest pain with cough, no radiation

## 2014-03-10 NOTE — ED Provider Notes (Signed)
CSN: [id number]     Arrival date & time 03/10/14  1136 History   First MD Initiated Contact with Patient 03/10/14 1241     Chief Complaint  Patient presents with  . Cough  . Abnormal ECG     (Consider location/radiation/quality/duration/timing/severity/associated sxs/prior Treatment) The history is provided by the patient and medical records.    This is a 48 year old female with past medical history significant for addison's disease, anxiety, depression, WPW s/p ablation, presenting to the ED for cough, sore throat, shortness of breath and chest pain ongoing for the past 4 days.  Patient states chest pain and SOB worse with coughing, pain localized to her right lateral ribs, described as an aching pain without noted radiation.  Patient denies any fevers but states she has had some subjective chills for the past 2 days. She has taken over-the-counter cough and cold medications without noted improvement of her symptoms. Patient states since her ablation for Wolff-Parkinson-White she has not had any cardiac issues. She is not currently followed by cardiologist.  Patient is not a smoker.  Denies any abdominal pain, nausea, vomiting, or diarrhea. Patient states prior to her URI, she had no exertional chest pain or shortness of breath. Vital signs stable on arrival.  Past Medical History  Diagnosis Date  . Collagenous colitis   . WPW (Wolff-Parkinson-White syndrome)   . Addison disease   . Thyroid disease   . Clostridium difficile diarrhea   . Anxiety and depression   . Chronic diarrhea   . Chronic back pain   . Drug-seeking behavior   . Irritable bowel syndrome (IBS)   . Anemia   . Pancreatitis   . Kidney stone   . Kidney stone   . Depression   . Anxiety   . Vaginal delivery [id number], 1998   Past Surgical History  Procedure Laterality Date  . Cholecystectomy    . Tubal ligation    . Lithotripsy    . Sphincterotomy    . Cardiac electrophysiology mapping and ablation      for WPW    . Video bronchoscopy  08/31/2011    Procedure: VIDEO BRONCHOSCOPY WITH FLUORO;  Surgeon: Elsie Stain, MD;  Location: [facility];  Service: Cardiopulmonary;  Laterality: N/A;  . Vaginal hysterectomy  10/28/2013    Procedure: HYSTERECTOMY VAGINAL;  Surgeon: Emily Filbert, MD;  Location: [facility];  Service: Gynecology;;  . Salpingoophorectomy Bilateral 10/28/2013    Procedure: SALPINGO OOPHORECTOMY;  Surgeon: Emily Filbert, MD;  Location: [facility];  Service: Gynecology;  Laterality: Bilateral;  . Abdominal hysterectomy  10/28/2013   Family History  Problem Relation Age of Onset  . Prostate cancer Father   . Coronary artery disease Maternal Grandfather    History  Substance Use Topics  . Smoking status: Current Every Day Smoker -- 0.25 packs/day for 30 years    Types: Cigarettes  . Smokeless tobacco: Never Used  . Alcohol Use: Yes     Comment: seldom   OB History    Gravida Para Term Preterm AB TAB SAB Ectopic Multiple Living   3 3 3       3      Review of Systems  HENT: Positive for congestion and rhinorrhea.   Respiratory: Positive for cough and shortness of breath.   Cardiovascular: Positive for chest pain.  All other systems reviewed and are negative.     Allergies  Prednisone; Butrans; Nsaids; and Zofran  Home Medications   Prior to Admission medications  Medication Sig Start Date End Date Taking? Authorizing Provider  acetaminophen (TYLENOL) 325 MG tablet Take 650 mg by mouth every 6 (six) hours as needed for headache (headache).    Historical Provider, MD  ciprofloxacin (CIPRO) 500 MG tablet Take 1 tablet (500 mg total) by mouth 2 (two) times daily. One po bid x 7 days 01/27/14   Carrie Mew, PA-C  escitalopram (LEXAPRO) 20 MG tablet Take 20 mg by mouth every morning.     Historical Provider, MD  HYDROcodone-acetaminophen (NORCO/VICODIN) 5-325 MG per tablet Take 1-2 tablets by mouth every 6 (six) hours as needed. 02/12/14   Heather Laisure, PA-C  LORazepam (ATIVAN) 1  MG tablet Take 0.5-1 mg by mouth 2 (two) times daily as needed for anxiety (anxiety).     Historical Provider, MD  metroNIDAZOLE (FLAGYL) 500 MG tablet Take 1 tablet (500 mg total) by mouth 3 (three) times daily. One po bid x 7 days 01/27/14   Carrie Mew, PA-C  Multiple Vitamin (MULITIVITAMIN WITH MINERALS) TABS Take 1 tablet by mouth every morning.     Historical Provider, MD  potassium chloride (KLOR-CON M10) 10 MEQ tablet Take 10 mEq by mouth daily.     Historical Provider, MD  promethazine (PHENERGAN) 25 MG tablet Take 1 tablet (25 mg total) by mouth every 6 (six) hours as needed for nausea. 02/12/14   Heather Laisure, PA-C  zolpidem (AMBIEN) 10 MG tablet Take 10 mg by mouth at bedtime as needed for sleep (sleep).     Historical Provider, MD   BP 158/74 mmHg  Pulse 84  Resp 16  SpO2 100%  LMP 08/06/2013   Physical Exam  Constitutional: She is oriented to person, place, and time. She appears well-developed and well-nourished. No distress.  HENT:  Head: Normocephalic and atraumatic.  Mouth/Throat: Oropharynx is clear and moist.  Eyes: Conjunctivae and EOM are normal. Pupils are equal, round, and reactive to light.  Neck: Normal range of motion. Neck supple.  Cardiovascular: Normal rate, regular rhythm and normal heart sounds.   Pulmonary/Chest: Effort normal and breath sounds normal. No respiratory distress. She has no wheezes. She exhibits tenderness and bony tenderness. She exhibits no edema, no deformity and no swelling.    Mild tenderness of right lateral ribs without noted deformity, lungs clear bilaterally  Abdominal: Soft. Bowel sounds are normal. There is no tenderness. There is no guarding.  Musculoskeletal: Normal range of motion. She exhibits no edema.  Neurological: She is alert and oriented to person, place, and time.  Skin: Skin is warm and dry. She is not diaphoretic.  Psychiatric: She has a normal mood and affect.  Nursing note and vitals reviewed.   ED Course    Procedures (including critical care time) Labs Review Labs Reviewed  CBC - Abnormal; Notable for the following:    WBC 10.7 (*)    RBC 3.51 (*)    MCV 105.7 (*)    MCH 35.0 (*)    All other components within normal limits  PRO B NATRIURETIC PEPTIDE - Abnormal; Notable for the following:    Pro B Natriuretic peptide (BNP) 227.7 (*)    All other components within normal limits  BASIC METABOLIC PANEL  I-STAT TROPOININ, ED    Imaging Review Dg Chest 2 View  03/10/2014   CLINICAL DATA:  Cough and fever and body aches.  Chest pain.  EXAM: CHEST  2 VIEW  COMPARISON:  01/22/2014  FINDINGS: The heart size and mediastinal contours are within normal limits.  Both lungs are clear. The visualized skeletal structures are unremarkable.  IMPRESSION: Normal exam.   Electronically Signed   By: Rozetta Nunnery M.D.   On: 03/10/2014 10:49     EKG Interpretation None      MDM   Final diagnoses:  Chest pain, unspecified chest pain type  Cough  Shortness of breath   48 year old female with cough, sore throat, chest pain, ongoing since Friday (03/07/14).  Patient states chest pain is more localized to her right ribs, worse with active coughing. She does note some occasional shortness of breath, also worse with coughing.  Seen at urgent care and sent to the ED for further evaluation of her chest pain.  On arrival patient in NAD, vital signs stable.  EKG sinus rhythm without acute ischemic changes, largely unchanged from previous. Will obtain basic labs.  CXR from urgent care reviewed-- negative for acute process.  Troponin negative.  Lab work largely reassuring.  BNP mildly elevated at 227.7-- no correlating vascular congestion or edema on CXR and patient does not appear fluid overloaded.  Given patient's current constellation of symptoms, feel her chest pain (right ribs, reproducible) and shortness of breath are largely due to her URI.  She is adamant that she had no chest pain or shortness of breath prior  to onset of illness. Low suspicion for ACS, PE, dissection, or other acute cardiac event at this time. Given patient's history of WPW, she will be referred back to cardiology for any ongoing chest pain.  Rx cough meds and albuterol inhaler PRN.  Discussed plan with patient, he/she acknowledged understanding and agreed with plan of care.  Return precautions given for new or worsening symptoms.  Case discussed with attending physician, Dr. Ralene Bathe, who evaluated patient and agrees with assessment and plan of care.  Larene Pickett, PA-C 03/10/14 1433  Quintella Reichert, MD 03/10/14 [phone]

## 2014-03-10 NOTE — ED Notes (Signed)
Pt being sent by [facility] c/o cough and chest pain.  Staff reports that chest x-ray was negative, but the EKG was "concerning."  Pt being sent for further eval.

## 2014-03-10 NOTE — ED Provider Notes (Signed)
Unable to upload EKG in MUSE.  NSR, rate of 73.  Normal axis.  q waves in V1, V2, V3. No acute ST changes.   Quintella Reichert, MD 03/10/14 [phone]

## 2014-03-15 ENCOUNTER — Encounter (HOSPITAL_COMMUNITY): Payer: Self-pay | Admitting: Emergency Medicine

## 2014-03-15 ENCOUNTER — Emergency Department (HOSPITAL_COMMUNITY): Payer: BC Managed Care – PPO

## 2014-03-15 ENCOUNTER — Emergency Department (HOSPITAL_COMMUNITY)
Admission: EM | Admit: 2014-03-15 | Discharge: 2014-03-15 | Disposition: A | Discharge status: Home / Self Care | Payer: BC Managed Care – PPO | Attending: Emergency Medicine | Admitting: Emergency Medicine

## 2014-03-15 DIAGNOSIS — R059 Cough, unspecified: Secondary | ICD-10-CM

## 2014-03-15 DIAGNOSIS — G8929 Other chronic pain: Secondary | ICD-10-CM | POA: Diagnosis not present

## 2014-03-15 DIAGNOSIS — F329 Major depressive disorder, single episode, unspecified: Secondary | ICD-10-CM | POA: Diagnosis not present

## 2014-03-15 DIAGNOSIS — Z79899 Other long term (current) drug therapy: Secondary | ICD-10-CM | POA: Insufficient documentation

## 2014-03-15 DIAGNOSIS — Z720 Tobacco use: Secondary | ICD-10-CM | POA: Insufficient documentation

## 2014-03-15 DIAGNOSIS — J441 Chronic obstructive pulmonary disease with (acute) exacerbation: Secondary | ICD-10-CM | POA: Insufficient documentation

## 2014-03-15 DIAGNOSIS — Z792 Long term (current) use of antibiotics: Secondary | ICD-10-CM | POA: Insufficient documentation

## 2014-03-15 DIAGNOSIS — Z8639 Personal history of other endocrine, nutritional and metabolic disease: Secondary | ICD-10-CM | POA: Diagnosis not present

## 2014-03-15 DIAGNOSIS — R110 Nausea: Secondary | ICD-10-CM | POA: Diagnosis not present

## 2014-03-15 DIAGNOSIS — D649 Anemia, unspecified: Secondary | ICD-10-CM | POA: Diagnosis not present

## 2014-03-15 DIAGNOSIS — R05 Cough: Secondary | ICD-10-CM

## 2014-03-15 DIAGNOSIS — Z87442 Personal history of urinary calculi: Secondary | ICD-10-CM | POA: Insufficient documentation

## 2014-03-15 DIAGNOSIS — Z8719 Personal history of other diseases of the digestive system: Secondary | ICD-10-CM | POA: Insufficient documentation

## 2014-03-15 DIAGNOSIS — R0602 Shortness of breath: Secondary | ICD-10-CM | POA: Diagnosis present

## 2014-03-15 DIAGNOSIS — Z8619 Personal history of other infectious and parasitic diseases: Secondary | ICD-10-CM | POA: Diagnosis not present

## 2014-03-15 DIAGNOSIS — F419 Anxiety disorder, unspecified: Secondary | ICD-10-CM | POA: Diagnosis not present

## 2014-03-15 DIAGNOSIS — M25511 Pain in right shoulder: Secondary | ICD-10-CM | POA: Insufficient documentation

## 2014-03-15 MED ORDER — PREDNISONE 20 MG PO TABS
40.0000 mg | ORAL_TABLET | Freq: Every day | ORAL | Status: DC
Start: 2014-03-15 — End: 2014-07-23

## 2014-03-15 NOTE — Discharge Instructions (Signed)
Follow up with your doctor for continued or worsening symptoms as dicussed Chronic Obstructive Pulmonary Disease Exacerbation Chronic obstructive pulmonary disease (COPD) is a common lung condition in which airflow from the lungs is limited. COPD is a general term that can be used to describe many different lung problems that limit airflow, including chronic bronchitis and emphysema. COPD exacerbations are episodes when breathing symptoms become much worse and require extra treatment. Without treatment, COPD exacerbations can be life threatening, and frequent COPD exacerbations can cause further damage to your lungs. CAUSES   Respiratory infections.   Exposure to smoke.   Exposure to air pollution, chemical fumes, or dust. Sometimes there is no apparent cause or trigger. RISK FACTORS  Smoking cigarettes.  Older age.  Frequent prior COPD exacerbations. SIGNS AND SYMPTOMS   Increased coughing.   Increased thick spit (sputum) production.   Increased wheezing.   Increased shortness of breath.   Rapid breathing.   Chest tightness. DIAGNOSIS  Your medical history, a physical exam, and tests will help your health care provider make a diagnosis. Tests may include:  A chest X-ray.  Basic lab tests.  Sputum testing.  An arterial blood gas test. TREATMENT  Depending on the severity of your COPD exacerbation, you may need to be admitted to a hospital for treatment. Some of the treatments commonly used to treat COPD exacerbations are:   Antibiotic medicines.   Bronchodilators. These are drugs that expand the air passages. They may be given with an inhaler or nebulizer. Spacer devices may be needed to help improve drug delivery.  Corticosteroid medicines.  Supplemental oxygen therapy.  HOME CARE INSTRUCTIONS   Do not smoke. Quitting smoking is very important to prevent COPD from getting worse and exacerbations from happening as often.  Avoid exposure to all  substances that irritate the airway, especially to tobacco smoke.   If you were prescribed an antibiotic medicine, finish it all even if you start to feel better.  Take all medicines as directed by your health care provider.It is important to use correct technique with inhaled medicines.  Drink enough fluids to keep your urine clear or pale yellow (unless you have a medical condition that requires fluid restriction).  Use a cool mist vaporizer. This makes it easier to clear your chest when you cough.   If you have a home nebulizer and oxygen, continue to use them as directed.   Maintain all necessary vaccinations to prevent infections.   Exercise regularly.   Eat a healthy diet.   Keep all follow-up appointments as directed by your health care provider. SEEK IMMEDIATE MEDICAL CARE IF:  You have worsening shortness of breath.   You have trouble talking.   You have severe chest pain.  You have blood in your sputum.  You have a fever.  You have weakness, vomit repeatedly, or faint.   You feel confused.   You continue to get worse. MAKE SURE YOU:   Understand these instructions.  Will watch your condition.  Will get help right away if you are not doing well or get worse. Document Released: 01/16/2007 Document Revised: 08/05/2013 Document Reviewed: 11/23/2012 [facility] Patient Information 2015 [place], [place]. This information is not intended to replace advice given to you by your health care provider. Make sure you discuss any questions you have with your health care provider.

## 2014-03-15 NOTE — ED Provider Notes (Signed)
CSN: [id number]     Arrival date & time 03/15/14  1239 History  This chart was scribed for non-physician practitioner, Glendell Docker, NP, working with Dorie Rank, MD, by Delphia Grates, ED Scribe. This patient was seen in room WTR8/WTR8 and the patient's care was started at 1:06 PM.   Chief Complaint  Patient presents with  . Facial Pain    Pressure in sinuses  . Cough    x 5 days  . Shortness of Breath    pain in chest and back with cough  . Shoulder Pain    The history is provided by the patient. No language interpreter was used.     HPI Comments: Dawn Frost is a 48 y.o. female, with history of thyroid disease, Addison disease, anxiety, and depression, who presents to the Emergency Department complaining of shortness of breath for the past week. Patient was seen here 5 days ago for similar complaint, where and EKG and labs were taken. She states that she was prescribed an inhaler, however, she reports her symptoms still persist. There is associated  "sporadic" fever (Tmax 100.1 F), cough, nausea, and chest pain described as pressure that radiates to the right ribs and right shoulder. She reports this pain is only with cough. Patient reports using her inhaler every 4 hours. Patient denies vomiting.   Past Medical History  Diagnosis Date  . Collagenous colitis   . WPW (Wolff-Parkinson-White syndrome)   . Addison disease   . Thyroid disease   . Clostridium difficile diarrhea   . Anxiety and depression   . Chronic diarrhea   . Chronic back pain   . Drug-seeking behavior   . Irritable bowel syndrome (IBS)   . Anemia   . Pancreatitis   . Kidney stone   . Kidney stone   . Depression   . Anxiety   . Vaginal delivery [id number], 1998   Past Surgical History  Procedure Laterality Date  . Cholecystectomy    . Tubal ligation    . Lithotripsy    . Sphincterotomy    . Cardiac electrophysiology mapping and ablation      for WPW  . Video bronchoscopy  08/31/2011     Procedure: VIDEO BRONCHOSCOPY WITH FLUORO;  Surgeon: Elsie Stain, MD;  Location: [facility];  Service: Cardiopulmonary;  Laterality: N/A;  . Vaginal hysterectomy  10/28/2013    Procedure: HYSTERECTOMY VAGINAL;  Surgeon: Emily Filbert, MD;  Location: [facility];  Service: Gynecology;;  . Salpingoophorectomy Bilateral 10/28/2013    Procedure: SALPINGO OOPHORECTOMY;  Surgeon: Emily Filbert, MD;  Location: [facility];  Service: Gynecology;  Laterality: Bilateral;  . Abdominal hysterectomy  10/28/2013   Family History  Problem Relation Age of Onset  . Prostate cancer Father   . Coronary artery disease Maternal Grandfather    History  Substance Use Topics  . Smoking status: Current Every Day Smoker -- 0.25 packs/day for 30 years    Types: Cigarettes  . Smokeless tobacco: Never Used  . Alcohol Use: Yes     Comment: seldom   OB History    Gravida Para Term Preterm AB TAB SAB Ectopic Multiple Living   3 3 3       3      Review of Systems  Constitutional: Positive for fever.  Respiratory: Positive for cough.   Cardiovascular: Positive for chest pain (with cough).  Gastrointestinal: Positive for nausea.  Musculoskeletal: Positive for myalgias and arthralgias (right shoulder).  All other systems reviewed and are negative.  Allergies  Prednisone; Butrans; Nsaids; and Zofran  Home Medications   Prior to Admission medications   Medication Sig Start Date End Date Taking? Authorizing Provider  acetaminophen (TYLENOL) 325 MG tablet Take 650 mg by mouth every 6 (six) hours as needed for mild pain, fever or headache.     Historical Provider, MD  albuterol (PROVENTIL HFA;VENTOLIN HFA) 108 (90 BASE) MCG/ACT inhaler Inhale 1-2 puffs into the lungs every 6 (six) hours as needed for wheezing. 03/10/14   Larene Pickett, PA-C  benzonatate (TESSALON) 100 MG capsule Take 1 capsule (100 mg total) by mouth every 8 (eight) hours. 03/10/14   Larene Pickett, PA-C  ciprofloxacin (CIPRO) 500 MG tablet Take 1  tablet (500 mg total) by mouth 2 (two) times daily. One po bid x 7 days Patient not taking: Reported on 03/10/2014 01/27/14   Carrie Mew, PA-C  escitalopram (LEXAPRO) 20 MG tablet Take 20 mg by mouth every morning.     Historical Provider, MD  HYDROcodone-acetaminophen (NORCO/VICODIN) 5-325 MG per tablet Take 1-2 tablets by mouth every 6 (six) hours as needed. Patient not taking: Reported on 03/10/2014 02/12/14   Hyman Bible, PA-C  LORazepam (ATIVAN) 1 MG tablet Take 0.5-1 mg by mouth 2 (two) times daily as needed for anxiety (anxiety).     Historical Provider, MD  Menthol (HALLS COUGH DROPS MT) Use as directed 1 lozenge in the mouth or throat as needed (for cough).    Historical Provider, MD  metroNIDAZOLE (FLAGYL) 500 MG tablet Take 1 tablet (500 mg total) by mouth 3 (three) times daily. One po bid x 7 days Patient not taking: Reported on 03/10/2014 01/27/14   Carrie Mew, PA-C  Multiple Vitamin (MULITIVITAMIN WITH MINERALS) TABS Take 1 tablet by mouth every morning.     Historical Provider, MD  potassium chloride (KLOR-CON M10) 10 MEQ tablet Take 10 mEq by mouth daily.     Historical Provider, MD  promethazine (PHENERGAN) 25 MG tablet Take 1 tablet (25 mg total) by mouth every 6 (six) hours as needed for nausea or vomiting. 03/10/14   Larene Pickett, PA-C  zolpidem (AMBIEN) 10 MG tablet Take 5 mg by mouth at bedtime as needed for sleep (sleep).     Historical Provider, MD   Triage Vitals: BP 130/86 mmHg  Pulse 100  Temp(Src) 98.8 F (37.1 C) (Oral)  Resp 20  Wt 126 lb (57.153 kg)  SpO2 99%  LMP 08/06/2013  Physical Exam  Constitutional: She is oriented to person, place, and time. She appears well-developed and well-nourished. No distress.  HENT:  Head: Normocephalic and atraumatic.  Eyes: Conjunctivae and EOM are normal.  Neck: Neck supple. No tracheal deviation present.  Cardiovascular: Normal rate.   Pulmonary/Chest: Effort normal. No respiratory distress. She has rales.   Musculoskeletal: Normal range of motion.  Neurological: She is alert and oriented to person, place, and time.  Skin: Skin is warm and dry.  Psychiatric: She has a normal mood and affect. Her behavior is normal.  Nursing note and vitals reviewed.   ED Course  Procedures (including critical care time)  DIAGNOSTIC STUDIES: Oxygen Saturation is 99% on room air, normal by my interpretation.    COORDINATION OF CARE: At 1312 Discussed treatment plan with patient which includes CXR and review of past medical record from last visit to avoid repeating any unnecessary lab work up. Patient agrees.   Labs Review Labs Reviewed - No data to display  Imaging Review Dg Chest 2 View  03/15/2014   CLINICAL DATA:  Cough and chest pain for 5 days. History of pneumonia. Smoking history.  EXAM: CHEST  2 VIEW  COMPARISON:  03/10/2014.  FINDINGS: There is opacity that projects over the apex and superior aspect of the right upper lobe. An apparent curled radiodense opacity is associated with this. This may reflect the patient's hair superimposed on the right upper lobe.  Lungs are hyperexpanded but otherwise clear. No pleural effusion or pneumothorax.  Heart, mediastinum and hila are unremarkable.  Bony thorax is intact.  IMPRESSION: 1. On the frontal view, opacity projects over the right upper lobe extending to the apex. This is most likely the patient's hair superimposed over the right upper lobe, supported by lack of an abnormality on the lateral view. Recommend repeating the frontal view with the patient's hair pulled out of the field of view of the lungs. 2. No convincing acute cardiopulmonary disease. 3. Lung hyperexpansion suggesting COPD.   Electronically Signed   By: Lajean Manes M.D.   On: 03/15/2014 14:08     EKG Interpretation None      MDM   Final diagnoses:  Cough  COPD exacerbation    Pt is wells and perc negative. No infection noted on x-ray. Discussed copd with pt as she had never  heard that term. Will treat with steroids for a couple of days  I personally performed the services described in this documentation, which was scribed in my presence. The recorded information has been reviewed and is accurate.    Glendell Docker, NP 03/15/14 1454  Dorie Rank, MD 03/15/14 [phone]

## 2014-03-15 NOTE — ED Notes (Signed)
Pt reports increased chest wall pain and back pain with cough, sx increased over 7 days. Reports headache, facial pressure and persistent cough. Prescribed meds not decreasing symptoms

## 2014-04-10 ENCOUNTER — Emergency Department (HOSPITAL_COMMUNITY): Payer: BLUE CROSS/BLUE SHIELD

## 2014-04-10 ENCOUNTER — Encounter (HOSPITAL_COMMUNITY): Payer: Self-pay | Admitting: Emergency Medicine

## 2014-04-10 ENCOUNTER — Emergency Department (HOSPITAL_COMMUNITY)
Admission: EM | Admit: 2014-04-10 | Discharge: 2014-04-10 | Disposition: A | Discharge status: Home / Self Care | Payer: BLUE CROSS/BLUE SHIELD | Attending: Emergency Medicine | Admitting: Emergency Medicine

## 2014-04-10 DIAGNOSIS — Z8639 Personal history of other endocrine, nutritional and metabolic disease: Secondary | ICD-10-CM | POA: Insufficient documentation

## 2014-04-10 DIAGNOSIS — Z87442 Personal history of urinary calculi: Secondary | ICD-10-CM | POA: Insufficient documentation

## 2014-04-10 DIAGNOSIS — Z8679 Personal history of other diseases of the circulatory system: Secondary | ICD-10-CM | POA: Insufficient documentation

## 2014-04-10 DIAGNOSIS — R0602 Shortness of breath: Secondary | ICD-10-CM | POA: Diagnosis not present

## 2014-04-10 DIAGNOSIS — Z720 Tobacco use: Secondary | ICD-10-CM | POA: Diagnosis not present

## 2014-04-10 DIAGNOSIS — Z8619 Personal history of other infectious and parasitic diseases: Secondary | ICD-10-CM | POA: Diagnosis not present

## 2014-04-10 DIAGNOSIS — G8929 Other chronic pain: Secondary | ICD-10-CM | POA: Insufficient documentation

## 2014-04-10 DIAGNOSIS — Z792 Long term (current) use of antibiotics: Secondary | ICD-10-CM | POA: Insufficient documentation

## 2014-04-10 DIAGNOSIS — M546 Pain in thoracic spine: Secondary | ICD-10-CM | POA: Insufficient documentation

## 2014-04-10 DIAGNOSIS — D649 Anemia, unspecified: Secondary | ICD-10-CM | POA: Diagnosis not present

## 2014-04-10 DIAGNOSIS — K589 Irritable bowel syndrome without diarrhea: Secondary | ICD-10-CM | POA: Insufficient documentation

## 2014-04-10 DIAGNOSIS — F329 Major depressive disorder, single episode, unspecified: Secondary | ICD-10-CM | POA: Insufficient documentation

## 2014-04-10 DIAGNOSIS — Z79899 Other long term (current) drug therapy: Secondary | ICD-10-CM | POA: Diagnosis not present

## 2014-04-10 DIAGNOSIS — Z7952 Long term (current) use of systemic steroids: Secondary | ICD-10-CM | POA: Diagnosis not present

## 2014-04-10 DIAGNOSIS — R079 Chest pain, unspecified: Secondary | ICD-10-CM

## 2014-04-10 DIAGNOSIS — F419 Anxiety disorder, unspecified: Secondary | ICD-10-CM | POA: Insufficient documentation

## 2014-04-10 DIAGNOSIS — R1013 Epigastric pain: Secondary | ICD-10-CM | POA: Insufficient documentation

## 2014-04-10 DIAGNOSIS — R110 Nausea: Secondary | ICD-10-CM | POA: Insufficient documentation

## 2014-04-10 DIAGNOSIS — R0789 Other chest pain: Secondary | ICD-10-CM | POA: Insufficient documentation

## 2014-04-10 LAB — BRAIN NATRIURETIC PEPTIDE: B Natriuretic Peptide: 51.4 pg/mL (ref 0.0–100.0)

## 2014-04-10 LAB — I-STAT TROPONIN, ED
Troponin i, poc: 0 ng/mL (ref 0.00–0.08)
Troponin i, poc: 0 ng/mL (ref 0.00–0.08)

## 2014-04-10 LAB — CBC
HCT: 38.2 % (ref 36.0–46.0)
Hemoglobin: 12.6 g/dL (ref 12.0–15.0)
MCH: 35 pg — AB (ref 26.0–34.0)
MCHC: 33 g/dL (ref 30.0–36.0)
MCV: 106.1 fL — AB (ref 78.0–100.0)
PLATELETS: 237 10*3/uL (ref 150–400)
RBC: 3.6 MIL/uL — ABNORMAL LOW (ref 3.87–5.11)
RDW: 12.7 % (ref 11.5–15.5)
WBC: 6.2 10*3/uL (ref 4.0–10.5)

## 2014-04-10 LAB — BASIC METABOLIC PANEL
Anion gap: 7 (ref 5–15)
BUN: 14 mg/dL (ref 6–23)
CO2: 24 mmol/L (ref 19–32)
Calcium: 8.5 mg/dL (ref 8.4–10.5)
Chloride: 111 mEq/L (ref 96–112)
Creatinine, Ser: 0.68 mg/dL (ref 0.50–1.10)
GFR calc Af Amer: >90 mL/min (ref >90)
GLUCOSE: 79 mg/dL (ref 70–99)
POTASSIUM: 3.9 mmol/L (ref 3.5–5.1)
Sodium: 142 mmol/L (ref 135–145)

## 2014-04-10 MED ORDER — HYDROMORPHONE HCL 1 MG/ML IJ SOLN
1.0000 mg | Freq: Once | INTRAMUSCULAR | Status: AC
  Administered 2014-04-10: 1 mg via INTRAVENOUS
  Filled 2014-04-10: qty 1

## 2014-04-10 MED ORDER — METOCLOPRAMIDE HCL 5 MG/ML IJ SOLN
10.0000 mg | Freq: Once | INTRAMUSCULAR | Status: AC
  Administered 2014-04-10: 10 mg via INTRAVENOUS
  Filled 2014-04-10: qty 2

## 2014-04-10 MED ORDER — IOHEXOL 350 MG/ML SOLN
100.0000 mL | Freq: Once | INTRAVENOUS | Status: AC | PRN
  Administered 2014-04-10: 100 mL via INTRAVENOUS

## 2014-04-10 MED ORDER — GI COCKTAIL ~~LOC~~
30.0000 mL | Freq: Once | ORAL | Status: AC
  Administered 2014-04-10: 30 mL via ORAL
  Filled 2014-04-10: qty 30

## 2014-04-10 NOTE — ED Notes (Signed)
Wal[place], MD at bedside. 

## 2014-04-10 NOTE — ED Notes (Signed)
Pt c/o SOB, pressure in epigastric area radiating to both axilla, nausea.

## 2014-04-10 NOTE — Discharge Instructions (Signed)

## 2014-04-10 NOTE — ED Provider Notes (Signed)
CSN: [id number]     Arrival date & time 04/10/14  0914 History   First MD Initiated Contact with Patient 04/10/14 [phone]     Chief Complaint  Patient presents with  . Shortness of Breath  . Chest Pain     (Consider location/radiation/quality/duration/timing/severity/associated sxs/prior Treatment) Patient is a 49 y.o. female presenting with chest pain. The history is provided by the patient.  Chest Pain Pain location:  Substernal area and epigastric Pain quality: pressure and tightness   Pain radiates to:  Mid back Pain radiates to the back: yes   Pain severity:  Severe Onset quality:  Sudden Duration:  2 days Timing:  Constant Progression:  Unchanged Chronicity:  Recurrent Context: at rest   Associated symptoms: abdominal pain, back pain, nausea and shortness of breath   Associated symptoms: no cough, no fever and not vomiting     Past Medical History  Diagnosis Date  . Collagenous colitis   . WPW (Wolff-Parkinson-White syndrome)   . Addison disease   . Thyroid disease   . Clostridium difficile diarrhea   . Anxiety and depression   . Chronic diarrhea   . Chronic back pain   . Drug-seeking behavior   . Irritable bowel syndrome (IBS)   . Anemia   . Pancreatitis   . Kidney stone   . Kidney stone   . Depression   . Anxiety   . Vaginal delivery [id number], 1998   Past Surgical History  Procedure Laterality Date  . Cholecystectomy    . Tubal ligation    . Lithotripsy    . Sphincterotomy    . Cardiac electrophysiology mapping and ablation      for WPW  . Video bronchoscopy  08/31/2011    Procedure: VIDEO BRONCHOSCOPY WITH FLUORO;  Surgeon: Elsie Stain, MD;  Location: [facility];  Service: Cardiopulmonary;  Laterality: N/A;  . Vaginal hysterectomy  10/28/2013    Procedure: HYSTERECTOMY VAGINAL;  Surgeon: Emily Filbert, MD;  Location: [facility];  Service: Gynecology;;  . Salpingoophorectomy Bilateral 10/28/2013    Procedure: SALPINGO OOPHORECTOMY;  Surgeon: Emily Filbert, MD;  Location: [facility];  Service: Gynecology;  Laterality: Bilateral;  . Abdominal hysterectomy  10/28/2013   Family History  Problem Relation Age of Onset  . Prostate cancer Father   . Coronary artery disease Maternal Grandfather    History  Substance Use Topics  . Smoking status: Current Every Day Smoker -- 0.25 packs/day for 30 years    Types: Cigarettes  . Smokeless tobacco: Never Used  . Alcohol Use: Yes     Comment: seldom   OB History    Gravida Para Term Preterm AB TAB SAB Ectopic Multiple Living   3 3 3       3      Review of Systems  Constitutional: Negative for fever.  Respiratory: Positive for shortness of breath. Negative for cough.   Cardiovascular: Positive for chest pain.  Gastrointestinal: Positive for nausea and abdominal pain. Negative for vomiting.  Musculoskeletal: Positive for back pain.  All other systems reviewed and are negative.     Allergies  Prednisone; Butrans; Nsaids; and Zofran  Home Medications   Prior to Admission medications   Medication Sig Start Date End Date Taking? Authorizing Provider  acetaminophen (TYLENOL) 325 MG tablet Take 650 mg by mouth every 6 (six) hours as needed for mild pain, fever or headache.    Yes Historical Provider, MD  albuterol (PROVENTIL HFA;VENTOLIN HFA) 108 (90 BASE) MCG/ACT inhaler Inhale  1-2 puffs into the lungs every 6 (six) hours as needed for wheezing. 03/10/14  Yes Larene Pickett, PA-C  escitalopram (LEXAPRO) 20 MG tablet Take 20 mg by mouth every morning.    Yes Historical Provider, MD  LORazepam (ATIVAN) 1 MG tablet Take 0.5-1 mg by mouth 2 (two) times daily as needed for anxiety (anxiety).    Yes Historical Provider, MD  Menthol (HALLS COUGH DROPS MT) Use as directed 1 lozenge in the mouth or throat as needed (for cough).   Yes Historical Provider, MD  Multiple Vitamin (MULITIVITAMIN WITH MINERALS) TABS Take 1 tablet by mouth every morning.    Yes Historical Provider, MD  promethazine (PHENERGAN) 25  MG tablet Take 1 tablet (25 mg total) by mouth every 6 (six) hours as needed for nausea or vomiting. 03/10/14  Yes Larene Pickett, PA-C  zolpidem (AMBIEN) 10 MG tablet Take 5 mg by mouth at bedtime as needed for sleep (sleep).    Yes Historical Provider, MD  benzonatate (TESSALON) 100 MG capsule Take 1 capsule (100 mg total) by mouth every 8 (eight) hours. Patient not taking: Reported on 04/10/2014 03/10/14   Larene Pickett, PA-C  ciprofloxacin (CIPRO) 500 MG tablet Take 1 tablet (500 mg total) by mouth 2 (two) times daily. One po bid x 7 days Patient not taking: Reported on 03/10/2014 01/27/14   Carrie Mew, PA-C  HYDROcodone-acetaminophen (NORCO/VICODIN) 5-325 MG per tablet Take 1-2 tablets by mouth every 6 (six) hours as needed. Patient not taking: Reported on 03/10/2014 02/12/14   Hyman Bible, PA-C  metroNIDAZOLE (FLAGYL) 500 MG tablet Take 1 tablet (500 mg total) by mouth 3 (three) times daily. One po bid x 7 days Patient not taking: Reported on 03/10/2014 01/27/14   Carrie Mew, PA-C  predniSONE (DELTASONE) 20 MG tablet Take 2 tablets (40 mg total) by mouth daily. Patient not taking: Reported on 04/10/2014 03/15/14   Glendell Docker, NP   BP 143/93 mmHg  Pulse 88  Temp(Src) 98 F (36.7 C) (Oral)  Resp 18  SpO2 100%  LMP 08/06/2013 Physical Exam  Constitutional: She is oriented to person, place, and time. She appears well-developed and well-nourished. No distress.  HENT:  Head: Normocephalic and atraumatic.  Mouth/Throat: Oropharynx is clear and moist.  Eyes: EOM are normal. Pupils are equal, round, and reactive to light.  Neck: Normal range of motion. Neck supple.  Cardiovascular: Normal rate and regular rhythm.  Exam reveals no friction rub.   No murmur heard. Pulmonary/Chest: Effort normal and breath sounds normal. No respiratory distress. She has no wheezes. She has no rales.  Abdominal: Soft. She exhibits no distension. There is tenderness (epigastrum). There is no rebound.   Musculoskeletal: Normal range of motion. She exhibits no edema.  Neurological: She is alert and oriented to person, place, and time.  Skin: No rash noted. She is not diaphoretic.  Nursing note and vitals reviewed.   ED Course  Procedures (including critical care time) Labs Review Labs Reviewed  [place], ED    Imaging Review Ct Angio Chest Aorta W/cm &/or Wo/cm  04/10/2014   CLINICAL DATA:  Two day history of chest pain and epigastric abdominal pain radiating into the axillary regions, associated with nausea and shortness of breath. Current history of urinary tract calculi.  EXAM: CT ANGIOGRAPHY CHEST, ABDOMEN AND PELVIS  TECHNIQUE: Initially, multidetector CT imaging through the chest was performed without intravenous contrast. Multidetector CT imaging  through the chest, abdomen and pelvis was then performed using the standard protocol during bolus administration of intravenous contrast. Multiplanar reconstructed images and MIPs were obtained and reviewed to evaluate the vascular anatomy.  CONTRAST:  118mL OMNIPAQUE IOHEXOL 350 MG/ML IV.  COMPARISON:  CT abdomen and pelvis 02/12/2014, 08/07/2013, 03/02/2012, 01/30/2012, 01/06/2012, 03/12/2011, 01/30/2011, 01/27/2011, 09/27/2010, 10/09/2009. No prior chest CT.  FINDINGS: CTA CHEST FINDINGS  Unenhanced images demonstrate no evidence of mural hematoma involving thoracic aorta. No visible coronary artery atherosclerosis.  Enhanced images demonstrate no evidence of thoracic aortic aneurysm or dissection. Proximal great vessels widely patent. Mild atherosclerotic calcification involving the aortic arch in the distal descending thoracic aorta.  Heart size normal. No pericardial effusion. Central pulmonary arteries patent.  Pulmonary parenchyma clear without localized airspace consolidation, interstitial disease, or parenchymal nodules or masses. Azygos fissure again noted. No pleural  effusions. Central airways patent without significant bronchial wall thickening.  No significant mediastinal, hilar or axillary lymphadenopathy. Thyroid gland normal in appearance. Bone window images demonstrate mild lower thoracic spondylosis.  Review of the MIP images confirms the above findings.  CTA ABDOMEN AND PELVIS FINDINGS  Mild atherosclerosis involving the abdominal aorta without evidence of aneurysm or dissection. Widely patent celiac artery, SMA, IMA, and single renal arteries bilaterally. No visible iliofemoral atherosclerosis.  Numerous nonobstructing bilateral renal calculi. No obstructing ureteral calculus on either side. Kidneys otherwise normal in appearance.  Normal early arterial phase appearance of the liver, spleen, and adrenal glands. Gallbladder surgically absent which explains the mild extrahepatic and central intrahepatic biliary ductal dilation, unchanged. Mild diffuse pancreatic ductal dilation, unchanged, without focal parenchymal abnormality. No significant lymphadenopathy.  Stomach decompressed and normal in appearance. Normal-appearing small bowel. Moderate stool burden throughout the colon. No evidence of pathologic colonic wall thickening; thickening of the wall of the transverse colon is felt to be due to the fact this segment is decompressed, as there is no pericolonic inflammation. Normal appendix in the right upper pelvis. No ascites.  Uterus surgically absent. No adnexal masses. Urinary bladder unremarkable.  Bone window images unremarkable.  Review of the MIP images confirms the above findings.  IMPRESSION: 1. No evidence of thoracic or abdominal aortic aneurysm or dissection. Minimal to mild atherosclerosis involving the thoracic and abdominal aorta. 2.  No acute cardiopulmonary disease. 3. No acute abnormalities involving the abdomen or pelvis. 4. Numerous nonobstructing bilateral renal calculi.   Electronically Signed   By: Evangeline Dakin M.D.   On: 04/10/2014 12:31    Ct Angio Abd/pel W/ And/or W/o  04/10/2014   CLINICAL DATA:  Two day history of chest pain and epigastric abdominal pain radiating into the axillary regions, associated with nausea and shortness of breath. Current history of urinary tract calculi.  EXAM: CT ANGIOGRAPHY CHEST, ABDOMEN AND PELVIS  TECHNIQUE: Initially, multidetector CT imaging through the chest was performed without intravenous contrast. Multidetector CT imaging through the chest, abdomen and pelvis was then performed using the standard protocol during bolus administration of intravenous contrast. Multiplanar reconstructed images and MIPs were obtained and reviewed to evaluate the vascular anatomy.  CONTRAST:  139mL OMNIPAQUE IOHEXOL 350 MG/ML IV.  COMPARISON:  CT abdomen and pelvis 02/12/2014, 08/07/2013, 03/02/2012, 01/30/2012, 01/06/2012, 03/12/2011, 01/30/2011, 01/27/2011, 09/27/2010, 10/09/2009. No prior chest CT.  FINDINGS: CTA CHEST FINDINGS  Unenhanced images demonstrate no evidence of mural hematoma involving thoracic aorta. No visible coronary artery atherosclerosis.  Enhanced images demonstrate no evidence of thoracic aortic aneurysm or dissection. Proximal great vessels widely patent. Mild atherosclerotic calcification involving the  aortic arch in the distal descending thoracic aorta.  Heart size normal. No pericardial effusion. Central pulmonary arteries patent.  Pulmonary parenchyma clear without localized airspace consolidation, interstitial disease, or parenchymal nodules or masses. Azygos fissure again noted. No pleural effusions. Central airways patent without significant bronchial wall thickening.  No significant mediastinal, hilar or axillary lymphadenopathy. Thyroid gland normal in appearance. Bone window images demonstrate mild lower thoracic spondylosis.  Review of the MIP images confirms the above findings.  CTA ABDOMEN AND PELVIS FINDINGS  Mild atherosclerosis involving the abdominal aorta without evidence of aneurysm or  dissection. Widely patent celiac artery, SMA, IMA, and single renal arteries bilaterally. No visible iliofemoral atherosclerosis.  Numerous nonobstructing bilateral renal calculi. No obstructing ureteral calculus on either side. Kidneys otherwise normal in appearance.  Normal early arterial phase appearance of the liver, spleen, and adrenal glands. Gallbladder surgically absent which explains the mild extrahepatic and central intrahepatic biliary ductal dilation, unchanged. Mild diffuse pancreatic ductal dilation, unchanged, without focal parenchymal abnormality. No significant lymphadenopathy.  Stomach decompressed and normal in appearance. Normal-appearing small bowel. Moderate stool burden throughout the colon. No evidence of pathologic colonic wall thickening; thickening of the wall of the transverse colon is felt to be due to the fact this segment is decompressed, as there is no pericolonic inflammation. Normal appendix in the right upper pelvis. No ascites.  Uterus surgically absent. No adnexal masses. Urinary bladder unremarkable.  Bone window images unremarkable.  Review of the MIP images confirms the above findings.  IMPRESSION: 1. No evidence of thoracic or abdominal aortic aneurysm or dissection. Minimal to mild atherosclerosis involving the thoracic and abdominal aorta. 2.  No acute cardiopulmonary disease. 3. No acute abnormalities involving the abdomen or pelvis. 4. Numerous nonobstructing bilateral renal calculi.   Electronically Signed   By: Evangeline Dakin M.D.   On: 04/10/2014 12:31     EKG Interpretation   Date/Time:  Thursday April 10 2014 09:24:05 EST Ventricular Rate:  79 PR Interval:  170 QRS Duration: 84 QT Interval:  389 QTC Calculation: 446 R Axis:   47 Text Interpretation:  Sinus rhythm Probable anteroseptal infarct, old  Baseline wander in lead(s) V2 V3 V4 V5 No significant change since last  tracing Confirmed by Mingo Amber  MD, [place] ([id number]) on 04/10/2014 10:23:48 AM       MDM   Final diagnoses:  Chest pain    41F presents with chest pain that radiates straight through to her back. Began suddenly 2 days ago, associated SOB. Chest pain described as tightness, pressure. Had hx of pneumonia before, states feels similar to prior pneumonia. Patient is sitting in bed, no apparent distress, however is clutching her chest with her fist at her epigastrum. I am concerned about possible dissection.  Will CT her chest and check labs. EKG similar to prior. Dissection study negative. Serial troponins negative. Likely GI related, given GI cocktail. Stable for discharge.   Evelina Bucy, MD 04/11/14 [phone]

## 2014-04-17 ENCOUNTER — Encounter (HOSPITAL_COMMUNITY): Payer: Self-pay | Admitting: Surgery

## 2014-06-02 ENCOUNTER — Inpatient Hospital Stay (HOSPITAL_COMMUNITY)
Admission: EM | Admit: 2014-06-02 | Discharge: 2014-06-05 | DRG: 384 | Disposition: A | Discharge status: Home / Self Care | Payer: BLUE CROSS/BLUE SHIELD | Attending: Internal Medicine | Admitting: Internal Medicine

## 2014-06-02 ENCOUNTER — Emergency Department (HOSPITAL_COMMUNITY): Payer: BLUE CROSS/BLUE SHIELD

## 2014-06-02 ENCOUNTER — Encounter (HOSPITAL_COMMUNITY): Payer: Self-pay | Admitting: Emergency Medicine

## 2014-06-02 DIAGNOSIS — F17210 Nicotine dependence, cigarettes, uncomplicated: Secondary | ICD-10-CM | POA: Diagnosis present

## 2014-06-02 DIAGNOSIS — K5289 Other specified noninfective gastroenteritis and colitis: Secondary | ICD-10-CM | POA: Diagnosis present

## 2014-06-02 DIAGNOSIS — E876 Hypokalemia: Secondary | ICD-10-CM | POA: Diagnosis present

## 2014-06-02 DIAGNOSIS — Z9049 Acquired absence of other specified parts of digestive tract: Secondary | ICD-10-CM | POA: Diagnosis present

## 2014-06-02 DIAGNOSIS — F419 Anxiety disorder, unspecified: Secondary | ICD-10-CM | POA: Diagnosis present

## 2014-06-02 DIAGNOSIS — R112 Nausea with vomiting, unspecified: Secondary | ICD-10-CM | POA: Diagnosis present

## 2014-06-02 DIAGNOSIS — K449 Diaphragmatic hernia without obstruction or gangrene: Secondary | ICD-10-CM | POA: Diagnosis present

## 2014-06-02 DIAGNOSIS — R52 Pain, unspecified: Secondary | ICD-10-CM

## 2014-06-02 DIAGNOSIS — K52831 Collagenous colitis: Secondary | ICD-10-CM | POA: Diagnosis present

## 2014-06-02 DIAGNOSIS — R079 Chest pain, unspecified: Secondary | ICD-10-CM

## 2014-06-02 DIAGNOSIS — K259 Gastric ulcer, unspecified as acute or chronic, without hemorrhage or perforation: Secondary | ICD-10-CM | POA: Diagnosis present

## 2014-06-02 DIAGNOSIS — Z79899 Other long term (current) drug therapy: Secondary | ICD-10-CM | POA: Diagnosis not present

## 2014-06-02 DIAGNOSIS — Z7952 Long term (current) use of systemic steroids: Secondary | ICD-10-CM | POA: Diagnosis not present

## 2014-06-02 DIAGNOSIS — K263 Acute duodenal ulcer without hemorrhage or perforation: Secondary | ICD-10-CM | POA: Diagnosis present

## 2014-06-02 DIAGNOSIS — E039 Hypothyroidism, unspecified: Secondary | ICD-10-CM | POA: Diagnosis present

## 2014-06-02 DIAGNOSIS — R197 Diarrhea, unspecified: Secondary | ICD-10-CM | POA: Diagnosis present

## 2014-06-02 DIAGNOSIS — R1013 Epigastric pain: Secondary | ICD-10-CM | POA: Diagnosis present

## 2014-06-02 DIAGNOSIS — R1110 Vomiting, unspecified: Secondary | ICD-10-CM

## 2014-06-02 DIAGNOSIS — Z809 Family history of malignant neoplasm, unspecified: Secondary | ICD-10-CM

## 2014-06-02 LAB — CBC WITH DIFFERENTIAL/PLATELET
Basophils Absolute: 0 10*3/uL (ref 0.0–0.1)
Basophils Relative: 0 % (ref 0–1)
EOS ABS: 0.7 10*3/uL (ref 0.0–0.7)
Eosinophils Relative: 7 % — ABNORMAL HIGH (ref 0–5)
HCT: 39.2 % (ref 36.0–46.0)
Hemoglobin: 13.4 g/dL (ref 12.0–15.0)
LYMPHS ABS: 2.1 10*3/uL (ref 0.7–4.0)
Lymphocytes Relative: 23 % (ref 12–46)
MCH: 35.4 pg — AB (ref 26.0–34.0)
MCHC: 34.2 g/dL (ref 30.0–36.0)
MCV: 103.4 fL — ABNORMAL HIGH (ref 78.0–100.0)
MONOS PCT: 9 % (ref 3–12)
Monocytes Absolute: 0.8 10*3/uL (ref 0.1–1.0)
NEUTROS PCT: 61 % (ref 43–77)
Neutro Abs: 5.7 10*3/uL (ref 1.7–7.7)
Platelets: 366 10*3/uL (ref 150–400)
RBC: 3.79 MIL/uL — ABNORMAL LOW (ref 3.87–5.11)
RDW: 13.7 % (ref 11.5–15.5)
WBC: 9.4 10*3/uL (ref 4.0–10.5)

## 2014-06-02 LAB — COMPREHENSIVE METABOLIC PANEL
ALT: 16 U/L (ref 0–35)
ANION GAP: 12 (ref 5–15)
AST: 20 U/L (ref 0–37)
Albumin: 4.4 g/dL (ref 3.5–5.2)
Alkaline Phosphatase: 162 U/L — ABNORMAL HIGH (ref 39–117)
BILIRUBIN TOTAL: 0.6 mg/dL (ref 0.3–1.2)
BUN: 10 mg/dL (ref 6–23)
CALCIUM: 9.2 mg/dL (ref 8.4–10.5)
CO2: 23 mmol/L (ref 19–32)
CREATININE: 0.6 mg/dL (ref 0.50–1.10)
Chloride: 103 mmol/L (ref 96–112)
GLUCOSE: 93 mg/dL (ref 70–99)
Potassium: 2.5 mmol/L — CL (ref 3.5–5.1)
Sodium: 138 mmol/L (ref 135–145)
Total Protein: 7.5 g/dL (ref 6.0–8.3)

## 2014-06-02 LAB — URINE MICROSCOPIC-ADD ON

## 2014-06-02 LAB — URINALYSIS, ROUTINE W REFLEX MICROSCOPIC
BILIRUBIN URINE: NEGATIVE
Glucose, UA: NEGATIVE mg/dL
Ketones, ur: NEGATIVE mg/dL
Leukocytes, UA: NEGATIVE
NITRITE: NEGATIVE
PROTEIN: NEGATIVE mg/dL
SPECIFIC GRAVITY, URINE: 1.018 (ref 1.005–1.030)
UROBILINOGEN UA: 0.2 mg/dL (ref 0.0–1.0)
pH: 6.5 (ref 5.0–8.0)

## 2014-06-02 LAB — LIPASE, BLOOD: LIPASE: 22 U/L (ref 11–59)

## 2014-06-02 MED ORDER — ZOLPIDEM TARTRATE 5 MG PO TABS
5.0000 mg | ORAL_TABLET | Freq: Every evening | ORAL | Status: DC | PRN
  Administered 2014-06-04: 5 mg via ORAL
  Filled 2014-06-02: qty 1

## 2014-06-02 MED ORDER — MORPHINE SULFATE 4 MG/ML IJ SOLN
4.0000 mg | Freq: Once | INTRAMUSCULAR | Status: AC
  Administered 2014-06-02: 4 mg via INTRAVENOUS
  Filled 2014-06-02: qty 1

## 2014-06-02 MED ORDER — ACETAMINOPHEN 325 MG PO TABS: 650.0000 mg | ORAL_TABLET | Freq: Four times a day (QID) | ORAL | Status: DC | PRN

## 2014-06-02 MED ORDER — ACETAMINOPHEN 650 MG RE SUPP: 650.0000 mg | Freq: Four times a day (QID) | RECTAL | Status: DC | PRN

## 2014-06-02 MED ORDER — POTASSIUM CHLORIDE IN NACL 20-0.9 MEQ/L-% IV SOLN
INTRAVENOUS | Status: DC
  Administered 2014-06-03: 01:00:00 via INTRAVENOUS
  Filled 2014-06-02 (×2): qty 1000

## 2014-06-02 MED ORDER — POTASSIUM CHLORIDE 10 MEQ/100ML IV SOLN
10.0000 meq | Freq: Once | INTRAVENOUS | Status: AC
  Administered 2014-06-02: 10 meq via INTRAVENOUS
  Filled 2014-06-02: qty 100

## 2014-06-02 MED ORDER — SODIUM CHLORIDE 0.9 % IV SOLN
INTRAVENOUS | Status: AC
  Administered 2014-06-02: 23:00:00 via INTRAVENOUS

## 2014-06-02 MED ORDER — FENTANYL CITRATE 0.05 MG/ML IJ SOLN
25.0000 ug | INTRAMUSCULAR | Status: DC | PRN
  Administered 2014-06-03 (×4): 25 ug via INTRAVENOUS
  Filled 2014-06-02 (×4): qty 2

## 2014-06-02 MED ORDER — SODIUM CHLORIDE 0.9 % IV BOLUS (SEPSIS)
1000.0000 mL | Freq: Once | INTRAVENOUS | Status: AC
  Administered 2014-06-02: 1000 mL via INTRAVENOUS

## 2014-06-02 MED ORDER — ALBUTEROL SULFATE (2.5 MG/3ML) 0.083% IN NEBU: 3.0000 mL | INHALATION_SOLUTION | Freq: Four times a day (QID) | RESPIRATORY_TRACT | Status: DC | PRN

## 2014-06-02 MED ORDER — POTASSIUM CHLORIDE 10 MEQ/100ML IV SOLN
10.0000 meq | INTRAVENOUS | Status: AC
  Administered 2014-06-03 (×5): 10 meq via INTRAVENOUS
  Filled 2014-06-02 (×5): qty 100

## 2014-06-02 MED ORDER — HYDROMORPHONE HCL 1 MG/ML IJ SOLN
1.0000 mg | Freq: Once | INTRAMUSCULAR | Status: AC
  Administered 2014-06-02: 1 mg via INTRAVENOUS
  Filled 2014-06-02: qty 1

## 2014-06-02 MED ORDER — METOCLOPRAMIDE HCL 5 MG/ML IJ SOLN
10.0000 mg | Freq: Once | INTRAMUSCULAR | Status: AC
  Administered 2014-06-02: 10 mg via INTRAVENOUS
  Filled 2014-06-02: qty 2

## 2014-06-02 MED ORDER — ESCITALOPRAM OXALATE 20 MG PO TABS
20.0000 mg | ORAL_TABLET | Freq: Every morning | ORAL | Status: DC
Start: 2014-06-03 — End: 2014-06-05
  Administered 2014-06-03 – 2014-06-05 (×3): 20 mg via ORAL
  Filled 2014-06-02 (×4): qty 1

## 2014-06-02 MED ORDER — PROMETHAZINE HCL 25 MG/ML IJ SOLN
25.0000 mg | Freq: Once | INTRAMUSCULAR | Status: AC
  Administered 2014-06-02: 25 mg via INTRAVENOUS
  Filled 2014-06-02: qty 1

## 2014-06-02 MED ORDER — LORAZEPAM 0.5 MG PO TABS
0.5000 mg | ORAL_TABLET | Freq: Two times a day (BID) | ORAL | Status: DC | PRN
  Administered 2014-06-03 – 2014-06-04 (×3): 1 mg via ORAL
  Filled 2014-06-02 (×3): qty 2

## 2014-06-02 MED ORDER — ENOXAPARIN SODIUM 40 MG/0.4ML ~~LOC~~ SOLN
40.0000 mg | Freq: Every day | SUBCUTANEOUS | Status: DC
  Administered 2014-06-03 – 2014-06-04 (×3): 40 mg via SUBCUTANEOUS
  Filled 2014-06-02 (×3): qty 0.4

## 2014-06-02 NOTE — ED Provider Notes (Signed)
CSN: [id number]     Arrival date & time 06/02/14  1415 History   First MD Initiated Contact with Patient 06/02/14 1617     Chief Complaint  Patient presents with  . Emesis  . Abdominal Pain     (Consider location/radiation/quality/duration/timing/severity/associated sxs/prior Treatment) HPI   PCP: No PCP Per Patient Blood pressure 120/81, pulse 85, temperature 98.2 F (36.8 C), temperature source Oral, resp. rate 20, last menstrual period 08/06/2013, SpO2 100 %.  Dawn Frost is a 49 y.o.female with a significant PMH of WPW, addisons disease, thyroid disease, anxiety, chronic diarrhea, chronic back pain, drug-seeking behavior, IBS, anemia, pancreatitis, kidney stones, depression  presents to the ER with complaints of RUQ pain, epigastric pain, nausea and vomiting that started on Saturday but acutely worsened today. She has tried home medications of ativan, tylenol, and phenergan without relief. She reports the pain as excruciating and cries during exam.  Negative Review of Symptoms: CP, headache, fevers, diarrhea, lower extremity swelling, SOB, neck pain, confusion, weakness.  Past Medical History  Diagnosis Date  . Collagenous colitis   . WPW (Wolff-Parkinson-White syndrome)   . Addison disease   . Thyroid disease   . Clostridium difficile diarrhea   . Anxiety and depression   . Chronic diarrhea   . Chronic back pain   . Drug-seeking behavior   . Irritable bowel syndrome (IBS)   . Anemia   . Pancreatitis   . Kidney stone   . Kidney stone   . Depression   . Anxiety   . Vaginal delivery [id number], 1998   Past Surgical History  Procedure Laterality Date  . Cholecystectomy    . Tubal ligation    . Lithotripsy    . Sphincterotomy    . Cardiac electrophysiology mapping and ablation      for WPW  . Video bronchoscopy  08/31/2011    Procedure: VIDEO BRONCHOSCOPY WITH FLUORO;  Surgeon: Elsie Stain, MD;  Location: [facility];  Service: Cardiopulmonary;  Laterality:  N/A;  . Vaginal hysterectomy  10/28/2013    Procedure: HYSTERECTOMY VAGINAL;  Surgeon: Emily Filbert, MD;  Location: [facility];  Service: Gynecology;;  . Salpingoophorectomy Bilateral 10/28/2013    Procedure: SALPINGO OOPHORECTOMY;  Surgeon: Emily Filbert, MD;  Location: [facility];  Service: Gynecology;  Laterality: Bilateral;  . Abdominal hysterectomy  10/28/2013   Family History  Problem Relation Age of Onset  . Prostate cancer Father   . Coronary artery disease Maternal Grandfather    History  Substance Use Topics  . Smoking status: Current Every Day Smoker -- 0.25 packs/day for 30 years    Types: Cigarettes  . Smokeless tobacco: Never Used  . Alcohol Use: Yes     Comment: seldom   OB History    Gravida Para Term Preterm AB TAB SAB Ectopic Multiple Living   3 3 3       3      Review of Systems  10 Systems reviewed and are negative for acute change except as noted in the HPI.    Allergies  Prednisone; Amoxicillin; Butrans; Nsaids; and Zofran  Home Medications   Prior to Admission medications   Medication Sig Start Date End Date Taking? Authorizing Provider  acetaminophen (TYLENOL) 325 MG tablet Take 650 mg by mouth every 6 (six) hours as needed for mild pain, fever or headache.    Yes Historical Provider, MD  albuterol (PROVENTIL HFA;VENTOLIN HFA) 108 (90 BASE) MCG/ACT inhaler Inhale 1-2 puffs into the lungs every 6 (six)  hours as needed for wheezing. 03/10/14  Yes Larene Pickett, PA-C  ciprofloxacin (CIPRO) 500 MG tablet Take 1 tablet (500 mg total) by mouth 2 (two) times daily. One po bid x 7 days 01/27/14  Yes Carrie Mew, PA-C  escitalopram (LEXAPRO) 20 MG tablet Take 20 mg by mouth every morning.    Yes Historical Provider, MD  LORazepam (ATIVAN) 1 MG tablet Take 0.5-1 mg by mouth 2 (two) times daily as needed for anxiety (anxiety).    Yes Historical Provider, MD  Menthol (HALLS COUGH DROPS MT) Use as directed 1 lozenge in the mouth or throat as needed (for cough).   Yes  Historical Provider, MD  Multiple Vitamin (MULITIVITAMIN WITH MINERALS) TABS Take 1 tablet by mouth every morning.    Yes Historical Provider, MD  zolpidem (AMBIEN) 10 MG tablet Take 5 mg by mouth at bedtime as needed for sleep (sleep).    Yes Historical Provider, MD  benzonatate (TESSALON) 100 MG capsule Take 1 capsule (100 mg total) by mouth every 8 (eight) hours. Patient not taking: Reported on 04/10/2014 03/10/14   Larene Pickett, PA-C  HYDROcodone-acetaminophen (NORCO/VICODIN) 5-325 MG per tablet Take 1-2 tablets by mouth every 6 (six) hours as needed. Patient not taking: Reported on 03/10/2014 02/12/14   Hyman Bible, PA-C  metroNIDAZOLE (FLAGYL) 500 MG tablet Take 1 tablet (500 mg total) by mouth 3 (three) times daily. One po bid x 7 days Patient not taking: Reported on 03/10/2014 01/27/14   Carrie Mew, PA-C  predniSONE (DELTASONE) 20 MG tablet Take 2 tablets (40 mg total) by mouth daily. Patient not taking: Reported on 04/10/2014 03/15/14   Glendell Docker, NP  promethazine (PHENERGAN) 25 MG tablet Take 1 tablet (25 mg total) by mouth every 6 (six) hours as needed for nausea or vomiting. Patient not taking: Reported on 06/02/2014 03/10/14   Larene Pickett, PA-C   BP 120/81 mmHg  Pulse 85  Temp(Src) 98.2 F (36.8 C) (Oral)  Resp 20  SpO2 100%  LMP 08/06/2013 Physical Exam  Constitutional: She appears well-developed and well-nourished. She appears distressed (tearful and hunched over).  HENT:  Head: Normocephalic and atraumatic.  Eyes: Pupils are equal, round, and reactive to light.  Neck: Normal range of motion. Neck supple.  Cardiovascular: Normal rate and regular rhythm.   Pulmonary/Chest: Effort normal. No respiratory distress. She has no wheezes.  Abdominal: Soft. Bowel sounds are normal. She exhibits no distension. There is tenderness in the right upper quadrant and epigastric area. There is guarding (voluntary guarding). There is no rigidity, no rebound and no CVA tenderness.   Neurological: She is alert.  Skin: Skin is warm and dry.  Psychiatric: Her mood appears anxious.  Nursing note and vitals reviewed.   ED Course  Procedures (including critical care time) Labs Review Labs Reviewed  CBC WITH DIFFERENTIAL/PLATELET - Abnormal; Notable for the following:    RBC 3.79 (*)    MCV 103.4 (*)    MCH 35.4 (*)    Eosinophils Relative 7 (*)    All other components within normal limits  COMPREHENSIVE METABOLIC PANEL - Abnormal; Notable for the following:    Potassium 2.5 (*)    Alkaline Phosphatase 162 (*)    All other components within normal limits  URINALYSIS, ROUTINE W REFLEX MICROSCOPIC - Abnormal; Notable for the following:    APPearance CLOUDY (*)    Hgb urine dipstick LARGE (*)    All other components within normal limits  URINE MICROSCOPIC-ADD ON -  Abnormal; Notable for the following:    Bacteria, UA FEW (*)    All other components within normal limits  LIPASE, BLOOD  I-STAT CHEM 8, ED    Imaging Review Dg Abd Acute W/chest  06/02/2014   CLINICAL DATA:  Abdominal pain, nausea, vomiting  EXAM: ACUTE ABDOMEN SERIES (ABDOMEN 2 VIEW & CHEST 1 VIEW)  COMPARISON:  03/15/2014  FINDINGS: No free abdominal air is noted. Normal small bowel gas pattern. Postcholecystectomy surgical clips are noted. No radiopaque calculi are noted. There is mild colonic gas in right colon and descending colon. Moderate colonic gas in transverse colon. Heart size and mediastinal contours are within normal limits. Both lungs are clear.  IMPRESSION: No acute disease within chest. Normal small bowel gas pattern. Mild colonic gas is noted in right colon and descending colon. Moderate colonic gas in transverse colon. No free abdominal air.   Electronically Signed   By: Lahoma Crocker M.D.   On: 06/02/2014 19:19     EKG Interpretation   Date/Time:  Monday June 02 2014 20:32:28 EST Ventricular Rate:  59 PR Interval:  187 QRS Duration: 88 QT Interval:  454 QTC Calculation: 450 R  Axis:   67 Text Interpretation:  Sinus rhythm Probable anterolateral infarct, old U  waves present changed from prior ecg Confirmed by CAMPOS  MD, Lennette Bihari  ([id number]) on 06/02/2014 9:56:52 PM      MDM   Final diagnoses:  Pain  Hypokalemia  Epigastric pain  Intractable vomiting with nausea, vomiting of unspecified type   The patient has a hx of cholecystectomy but today has RUQ pain, inttractable vomiting and elevated [place] phos at 162. Her lab work also shows severe hypokalemia at 2.5 and the EKG correlates this with + u-waves. Abdominal US was done to evaluate abdominal pain and ultrasound tech reports dilated bile duct at 1 cm.   Medications  HYDROmorphone (DILAUDID) injection 1 mg (not administered)  sodium chloride 0.9 % bolus 1,000 mL (1,000 mLs Intravenous New Bag/Given 06/02/14 1804)  promethazine (PHENERGAN) injection 25 mg (25 mg Intravenous Given 06/02/14 1804)  morphine 4 MG/ML injection 4 mg (4 mg Intravenous Given 06/02/14 1813)  morphine 4 MG/ML injection 4 mg (4 mg Intravenous Given 06/02/14 1955)  metoCLOPramide (REGLAN) injection 10 mg (10 mg Intravenous Given 06/02/14 1955)  potassium chloride 10 mEq in 100 mL IVPB (10 mEq Intravenous New Bag/Given 06/02/14 2046)    The patients pain has been very difficult to control. IV potassium replacement initiated. Pt will require admission for hypokalemia and intractable vomiting.   Patient admitted to [facility], inpatient, tele.  Linus Mako, PA-C 06/02/14 [place], MD 06/02/14 2242

## 2014-06-02 NOTE — H&P (Signed)
[facility] History and Physical  [name] KZS:[id number] DOB: [birth year] DOA: 06/02/2014  Referring physician: ER physician. PCP: No PCP Per Patient   Chief Complaint: Nausea vomiting diarrhea with abdominal pain.  HPI: Dawn Frost is a 49 y.o. female with history of microscopic colitis, WPW syndrome status post ablation, anxiety presents to the ER because of nausea vomiting and right upper quadrant pain. Patient also has dated which patient states is chronic. Patient had a tooth abscess for which patient was placed on Cipro 3 days ago for which patient started developing nausea and vomiting and abdominal pain. Abdominal pain mostly in the right upper quadrant. Cramping pain. Patient has watery diarrhea. In addition patient states she has been having retrosternal chest discomfort which has been constant with no shortness of breath. In the ER acute abdominal series and sonogram of the abdomen has been unremarkable. In addition patient is found to have severe hypokalemia with EKG showing U waves. Patient will be admitted for further management.   Review of Systems: As presented in the history of presenting illness, rest negative.  Past Medical History  Diagnosis Date  . Collagenous colitis   . WPW (Wolff-Parkinson-White syndrome)   . Addison disease   . Thyroid disease   . Clostridium difficile diarrhea   . Anxiety and depression   . Chronic diarrhea   . Chronic back pain   . Drug-seeking behavior   . Irritable bowel syndrome (IBS)   . Anemia   . Pancreatitis   . Kidney stone   . Kidney stone   . Depression   . Anxiety   . Vaginal delivery [id number], 1998   Past Surgical History  Procedure Laterality Date  . Cholecystectomy    . Tubal ligation    . Lithotripsy    . Sphincterotomy    . Cardiac electrophysiology mapping and ablation      for WPW  . Video bronchoscopy  08/31/2011    Procedure: VIDEO BRONCHOSCOPY WITH FLUORO;  Surgeon: Elsie Stain, MD;   Location: [facility];  Service: Cardiopulmonary;  Laterality: N/A;  . Vaginal hysterectomy  10/28/2013    Procedure: HYSTERECTOMY VAGINAL;  Surgeon: Emily Filbert, MD;  Location: [facility];  Service: Gynecology;;  . Salpingoophorectomy Bilateral 10/28/2013    Procedure: SALPINGO OOPHORECTOMY;  Surgeon: Emily Filbert, MD;  Location: [facility];  Service: Gynecology;  Laterality: Bilateral;  . Abdominal hysterectomy  10/28/2013   Social History:  reports that she has been smoking Cigarettes.  She has a 7.5 pack-year smoking history. She has never used smokeless tobacco. She reports that she drinks alcohol. She reports that she uses illicit drugs (Barbituates). Where does patient live home. Can patient participate in ADLs? Yes.  Allergies  Allergen Reactions  . Prednisone Other (See Comments)    Hyperactivity and agitation  . Amoxicillin     Pt states she had a bad reaction to amox  . Butrans [Buprenorphine] Other (See Comments)    Burned skin  . Nsaids Other (See Comments)    Bad for colitis  . Zofran Other (See Comments)    Headache     Family History:  Family History  Problem Relation Age of Onset  . Prostate cancer Father   . Coronary artery disease Maternal Grandfather       Prior to Admission medications   Medication Sig Start Date End Date Taking? Authorizing Provider  acetaminophen (TYLENOL) 325 MG tablet Take 650 mg by mouth every 6 (six) hours as needed for mild pain,  fever or headache.    Yes Historical Provider, MD  albuterol (PROVENTIL HFA;VENTOLIN HFA) 108 (90 BASE) MCG/ACT inhaler Inhale 1-2 puffs into the lungs every 6 (six) hours as needed for wheezing. 03/10/14  Yes Larene Pickett, PA-C  ciprofloxacin (CIPRO) 500 MG tablet Take 1 tablet (500 mg total) by mouth 2 (two) times daily. One po bid x 7 days 01/27/14  Yes Carrie Mew, PA-C  escitalopram (LEXAPRO) 20 MG tablet Take 20 mg by mouth every morning.    Yes Historical Provider, MD  LORazepam (ATIVAN) 1 MG tablet Take  0.5-1 mg by mouth 2 (two) times daily as needed for anxiety (anxiety).    Yes Historical Provider, MD  Menthol (HALLS COUGH DROPS MT) Use as directed 1 lozenge in the mouth or throat as needed (for cough).   Yes Historical Provider, MD  Multiple Vitamin (MULITIVITAMIN WITH MINERALS) TABS Take 1 tablet by mouth every morning.    Yes Historical Provider, MD  zolpidem (AMBIEN) 10 MG tablet Take 5 mg by mouth at bedtime as needed for sleep (sleep).    Yes Historical Provider, MD  benzonatate (TESSALON) 100 MG capsule Take 1 capsule (100 mg total) by mouth every 8 (eight) hours. Patient not taking: Reported on 04/10/2014 03/10/14   Larene Pickett, PA-C  HYDROcodone-acetaminophen (NORCO/VICODIN) 5-325 MG per tablet Take 1-2 tablets by mouth every 6 (six) hours as needed. Patient not taking: Reported on 03/10/2014 02/12/14   Hyman Bible, PA-C  metroNIDAZOLE (FLAGYL) 500 MG tablet Take 1 tablet (500 mg total) by mouth 3 (three) times daily. One po bid x 7 days Patient not taking: Reported on 03/10/2014 01/27/14   Carrie Mew, PA-C  predniSONE (DELTASONE) 20 MG tablet Take 2 tablets (40 mg total) by mouth daily. Patient not taking: Reported on 04/10/2014 03/15/14   Glendell Docker, NP  promethazine (PHENERGAN) 25 MG tablet Take 1 tablet (25 mg total) by mouth every 6 (six) hours as needed for nausea or vomiting. Patient not taking: Reported on 06/02/2014 03/10/14   Larene Pickett, PA-C    Physical Exam: Filed Vitals:   06/02/14 1805 06/02/14 1815 06/02/14 1941 06/02/14 2233  BP: 130/91 130/96 120/81 122/63  Pulse: 90 89 85 76  Temp:  98.6 F (37 C) 98.2 F (36.8 C) 97.8 F (36.6 C)  TempSrc:  Oral Oral Oral  Resp: 20 18 20 20   SpO2: 99% 99% 100% 97%     General:  Moderately built and nourished.  Eyes: Anicteric no pallor.  ENT: No discharge from the ears eyes nose or mouth. Caried tooth.  Neck: No mass felt.  Cardiovascular: S1 and S2 heard.  Respiratory: No rhonchi or  crepitations.  Abdomen: Soft nontender bowel sounds present. No guarding or rigidity.  Skin: No rash.  Musculoskeletal: No edema.  Psychiatric: Appears normal.  Neurologic: Alert awake oriented to time place and person. Moves all extremities.  Labs on Admission:  Basic Metabolic Panel:  Recent Labs Lab 06/02/14 1621  NA 138  K 2.5*  CL 103  CO2 23  GLUCOSE 93  BUN 10  CREATININE 0.60  CALCIUM 9.2   Liver Function Tests:  Recent Labs Lab 06/02/14 1621  AST 20  ALT 16  ALKPHOS 162*  BILITOT 0.6  PROT 7.5  ALBUMIN 4.4    Recent Labs Lab 06/02/14 1621  LIPASE 22   No results for input(s): AMMONIA in the last 168 hours. CBC:  Recent Labs Lab 06/02/14 1621  WBC 9.4  NEUTROABS  5.7  HGB 13.4  HCT 39.2  MCV 103.4*  PLT 366   Cardiac Enzymes: No results for input(s): CKTOTAL, CKMB, CKMBINDEX, TROPONINI in the last 168 hours.  BNP (last 3 results)  Recent Labs  04/10/14 1025  BNP 51.4    ProBNP (last 3 results)  Recent Labs  03/10/14 1201  PROBNP 227.7*    CBG: No results for input(s): GLUCAP in the last 168 hours.  Radiological Exams on Admission: US Abdomen Limited  06/02/2014   CLINICAL DATA:  Right upper quadrant pain, epigastric pain for 2 days  EXAM: US ABDOMEN LIMITED - RIGHT UPPER QUADRANT  COMPARISON:  None.  FINDINGS: Gallbladder:  Surgically absent  Common bile duct:  Diameter: 10 mm in diameter mild prominent in size probable postcholecystectomy  Liver:  No focal lesion identified. Mild intrahepatic biliary ductal dilatation probable post cholecystectomy.  IMPRESSION: 1. Surgically absent gallbladder. Mild intrahepatic and extrahepatic biliary ductal dilatation probable postcholecystectomy.   Electronically Signed   By: Lahoma Crocker M.D.   On: 06/02/2014 22:27   Dg Abd Acute W/chest  06/02/2014   CLINICAL DATA:  Abdominal pain, nausea, vomiting  EXAM: ACUTE ABDOMEN SERIES (ABDOMEN 2 VIEW & CHEST 1 VIEW)  COMPARISON:  03/15/2014   FINDINGS: No free abdominal air is noted. Normal small bowel gas pattern. Postcholecystectomy surgical clips are noted. No radiopaque calculi are noted. There is mild colonic gas in right colon and descending colon. Moderate colonic gas in transverse colon. Heart size and mediastinal contours are within normal limits. Both lungs are clear.  IMPRESSION: No acute disease within chest. Normal small bowel gas pattern. Mild colonic gas is noted in right colon and descending colon. Moderate colonic gas in transverse colon. No free abdominal air.   Electronically Signed   By: Lahoma Crocker M.D.   On: 06/02/2014 19:19    EKG: Independently reviewed. Normal sinus rhythm with U waves.  Assessment/Plan Principal Problem:   Nausea with vomiting Active Problems:   Hypokalemia   Epigastric pain   Nausea & vomiting   1. Nausea vomiting with diarrhea and abdominal pain - patient is still complaining of right upper quadrant pain. At this time we will check stool studies including C. difficile and stool cultures as patient was recently placed on antibiotics for her tooth infection. Due to persistent abdominal pain we will check CT abdomen and pelvis. Patient has been kept nothing by mouth for now. 2. Chest pain - retrosternal persistent. Cycle cardiac markers check d-dimer. Patient chest pain happening even before patient started having vomiting. 3. Severe hypokalemia - replaced potassium through IV and check magnesium levels. Closely follow metabolic panel. 4. History of anxiety - continue present medication. 5. History of microscopic colitis being followed at [facility] - check CT abdomen and pelvis. See #1. 6. History of WPW syndrome status post ablation.   DVT Prophylaxis Lovenox.  Code Status: Full code.  Family Communication: None.  Disposition Plan: Admit to inpatient.    Adryen Cookson N. [facility] Pager [phone].  If 7PM-7AM, please contact  night-coverage [email or web address] Password [facility] 06/02/2014, 11:47 PM

## 2014-06-02 NOTE — ED Notes (Signed)
I tried to get labs and was not successful.  I made nurse aware she tried and was unsuccessful as well

## 2014-06-02 NOTE — ED Notes (Signed)
Ronny Bacon, Lab personal has informed that patients potassium is 2.5. Notified Tiffany, Therapist, sports. New order obtained for EKG.

## 2014-06-02 NOTE — ED Notes (Signed)
Pt c/o abdominal pain onset Saturday, emesis, worsened today.

## 2014-06-02 NOTE — ED Notes (Signed)
Informed Tiffany, PA of patients request for additional pain medication and nausea.

## 2014-06-03 ENCOUNTER — Inpatient Hospital Stay (HOSPITAL_COMMUNITY): Payer: BLUE CROSS/BLUE SHIELD

## 2014-06-03 ENCOUNTER — Encounter (HOSPITAL_COMMUNITY): Payer: Self-pay

## 2014-06-03 DIAGNOSIS — F419 Anxiety disorder, unspecified: Secondary | ICD-10-CM

## 2014-06-03 DIAGNOSIS — R197 Diarrhea, unspecified: Secondary | ICD-10-CM

## 2014-06-03 LAB — BASIC METABOLIC PANEL
Anion gap: 7 (ref 5–15)
BUN: 8 mg/dL (ref 6–23)
CALCIUM: 7.9 mg/dL — AB (ref 8.4–10.5)
CO2: 24 mmol/L (ref 19–32)
CREATININE: 0.57 mg/dL (ref 0.50–1.10)
Chloride: 106 mmol/L (ref 96–112)
GFR calc Af Amer: >90 mL/min (ref >90)
GFR calc non Af Amer: >90 mL/min (ref >90)
GLUCOSE: 89 mg/dL (ref 70–99)
Potassium: 3.2 mmol/L — ABNORMAL LOW (ref 3.5–5.1)
Sodium: 137 mmol/L (ref 135–145)

## 2014-06-03 LAB — CBC WITH DIFFERENTIAL/PLATELET
BASOS ABS: 0.1 10*3/uL (ref 0.0–0.1)
BASOS PCT: 1 % (ref 0–1)
EOS ABS: 1 10*3/uL — AB (ref 0.0–0.7)
EOS PCT: 11 % — AB (ref 0–5)
HCT: 33.4 % — ABNORMAL LOW (ref 36.0–46.0)
Hemoglobin: 11.4 g/dL — ABNORMAL LOW (ref 12.0–15.0)
Lymphocytes Relative: 16 % (ref 12–46)
Lymphs Abs: 1.6 10*3/uL (ref 0.7–4.0)
MCH: 35.8 pg — AB (ref 26.0–34.0)
MCHC: 34.1 g/dL (ref 30.0–36.0)
MCV: 105 fL — AB (ref 78.0–100.0)
Monocytes Absolute: 0.5 10*3/uL (ref 0.1–1.0)
Monocytes Relative: 5 % (ref 3–12)
Neutro Abs: 6.5 10*3/uL (ref 1.7–7.7)
Neutrophils Relative %: 67 % (ref 43–77)
PLATELETS: 302 10*3/uL (ref 150–400)
RBC: 3.18 MIL/uL — ABNORMAL LOW (ref 3.87–5.11)
RDW: 13.9 % (ref 11.5–15.5)
WBC: 9.7 10*3/uL (ref 4.0–10.5)

## 2014-06-03 LAB — GLUCOSE, CAPILLARY
GLUCOSE-CAPILLARY: 73 mg/dL (ref 70–99)
Glucose-Capillary: 156 mg/dL — ABNORMAL HIGH (ref 70–99)
Glucose-Capillary: 189 mg/dL — ABNORMAL HIGH (ref 70–99)
Glucose-Capillary: 67 mg/dL — ABNORMAL LOW (ref 70–99)
Glucose-Capillary: 70 mg/dL (ref 70–99)
Glucose-Capillary: 79 mg/dL (ref 70–99)

## 2014-06-03 LAB — CLOSTRIDIUM DIFFICILE BY PCR: Toxigenic C. Difficile by PCR: NEGATIVE

## 2014-06-03 LAB — D-DIMER, QUANTITATIVE: D-Dimer, Quant: 0.81 ug/mL-FEU — ABNORMAL HIGH (ref 0.00–0.48)

## 2014-06-03 LAB — MAGNESIUM: Magnesium: 1.9 mg/dL (ref 1.5–2.5)

## 2014-06-03 LAB — TROPONIN I

## 2014-06-03 LAB — LIPASE, BLOOD: Lipase: 42 U/L (ref 11–59)

## 2014-06-03 MED ORDER — MORPHINE SULFATE 4 MG/ML IJ SOLN
4.0000 mg | INTRAMUSCULAR | Status: DC | PRN
  Administered 2014-06-03 – 2014-06-05 (×9): 4 mg via INTRAVENOUS
  Filled 2014-06-03 (×9): qty 1

## 2014-06-03 MED ORDER — PROMETHAZINE HCL 25 MG/ML IJ SOLN
25.0000 mg | Freq: Four times a day (QID) | INTRAMUSCULAR | Status: DC | PRN
  Administered 2014-06-03 – 2014-06-05 (×5): 25 mg via INTRAVENOUS
  Filled 2014-06-03 (×5): qty 1

## 2014-06-03 MED ORDER — SODIUM CHLORIDE 0.9 % IV BOLUS (SEPSIS): 1000.0000 mL | Freq: Once | INTRAVENOUS | Status: DC

## 2014-06-03 MED ORDER — IOHEXOL 300 MG/ML  SOLN
25.0000 mL | INTRAMUSCULAR | Status: AC
  Administered 2014-06-03 (×2): 25 mL via ORAL

## 2014-06-03 MED ORDER — IOHEXOL 350 MG/ML SOLN
100.0000 mL | Freq: Once | INTRAVENOUS | Status: AC | PRN
  Administered 2014-06-03: 100 mL via INTRAVENOUS

## 2014-06-03 MED ORDER — PROMETHAZINE HCL 25 MG/ML IJ SOLN: 12.5000 mg | Freq: Four times a day (QID) | INTRAMUSCULAR | Status: DC | PRN

## 2014-06-03 MED ORDER — SODIUM CHLORIDE 0.9 % IV SOLN
INTRAVENOUS | Status: DC
  Administered 2014-06-03 – 2014-06-05 (×3): via INTRAVENOUS
  Filled 2014-06-03 (×8): qty 1000

## 2014-06-03 MED ORDER — METOCLOPRAMIDE HCL 5 MG/ML IJ SOLN: 5.0000 mg | Freq: Four times a day (QID) | INTRAMUSCULAR | Status: DC | PRN

## 2014-06-03 MED ORDER — DEXTROSE 50 % IV SOLN
INTRAVENOUS | Status: AC
  Administered 2014-06-03: 25 mL
  Filled 2014-06-03: qty 50

## 2014-06-03 MED ORDER — MORPHINE SULFATE 2 MG/ML IJ SOLN
2.0000 mg | INTRAMUSCULAR | Status: DC | PRN
  Administered 2014-06-03 (×2): 2 mg via INTRAVENOUS
  Filled 2014-06-03 (×2): qty 1

## 2014-06-03 MED ORDER — DEXTROSE 50 % IV SOLN
INTRAVENOUS | Status: AC
  Administered 2014-06-03: 50 mL
  Filled 2014-06-03: qty 50

## 2014-06-03 MED ORDER — POTASSIUM CHLORIDE CRYS ER 20 MEQ PO TBCR
40.0000 meq | EXTENDED_RELEASE_TABLET | ORAL | Status: AC
Start: 2014-06-03 — End: 2014-06-03
  Administered 2014-06-03 (×2): 40 meq via ORAL
  Filled 2014-06-03 (×2): qty 2

## 2014-06-03 MED ORDER — PROMETHAZINE HCL 25 MG/ML IJ SOLN
12.5000 mg | Freq: Four times a day (QID) | INTRAMUSCULAR | Status: AC | PRN
  Administered 2014-06-03 (×2): 12.5 mg via INTRAVENOUS
  Filled 2014-06-03 (×2): qty 1

## 2014-06-03 NOTE — Progress Notes (Signed)
Hypoglycemic Event  CBG: 67  Treatment: D50 IV 25 mL  Symptoms: None  Follow-up CBG: Time:1254 CBG Result:156  Possible Reasons for Event: Inadequate meal intake  Comments/MD notified:    Stacey Drain  Remember to initiate Hypoglycemia Order Set & complete

## 2014-06-03 NOTE — Consult Note (Signed)
Referring Provider: Dr. Grandville Silos Primary Care Physician:  No PCP Per Patient Primary Gastroenterologist:  UNASSIGNED  Reason for Consultation:  Abdominal pain; Nausea and Vomiting  HPI: Dawn Frost is a 49 y.o. female with a history of collagenous colitis being seen in consultation due to acute onset of epigastric pain that radiated to her back along with nausea and vomiting. Abdominal pain was sharp and constant. Reports chronic diarrhea that worsened recently and describes it as nonbloody that increases when she eats. Last colonoscopy in 2014 in [place], which was normal. Denies melena, hematochezia. Recently on Cipro for a tooth abscess and while on Cipro the N/V/Abd pain started. Denies being on Budesonide for the collagenous colitis at this time. Lipase, LFTs normal. CT shows scattered colonic wall thickening with increased prominence at the splenic flexure. C. Diff negative. Potassium low at 2.5 on admit has been repleted to 3.2. Nurse in room during my evaluation.   Past Medical History  Diagnosis Date  . Collagenous colitis   . WPW (Wolff-Parkinson-White syndrome)   . Addison disease   . Thyroid disease   . Clostridium difficile diarrhea   . Anxiety and depression   . Chronic diarrhea   . Chronic back pain   . Drug-seeking behavior   . Irritable bowel syndrome (IBS)   . Anemia   . Pancreatitis   . Kidney stone   . Kidney stone   . Depression   . Anxiety   . Vaginal delivery [id number] 1998    Past Surgical History  Procedure Laterality Date  . Cholecystectomy    . Tubal ligation    . Lithotripsy    . Sphincterotomy    . Cardiac electrophysiology mapping and ablation      for WPW  . Video bronchoscopy  08/31/2011    Procedure: VIDEO BRONCHOSCOPY WITH FLUORO;  Surgeon: Elsie Stain, MD;  Location: [facility];  Service: Cardiopulmonary;  Laterality: N/A;  . Vaginal hysterectomy  10/28/2013    Procedure: HYSTERECTOMY VAGINAL;  Surgeon: Emily Filbert, MD;   Location: [facility];  Service: Gynecology;;  . Salpingoophorectomy Bilateral 10/28/2013    Procedure: SALPINGO OOPHORECTOMY;  Surgeon: Emily Filbert, MD;  Location: [facility];  Service: Gynecology;  Laterality: Bilateral;  . Abdominal hysterectomy  10/28/2013    Prior to Admission medications   Medication Sig Start Date End Date Taking? Authorizing Provider  acetaminophen (TYLENOL) 325 MG tablet Take 650 mg by mouth every 6 (six) hours as needed for mild pain, fever or headache.    Yes Historical Provider, MD  albuterol (PROVENTIL HFA;VENTOLIN HFA) 108 (90 BASE) MCG/ACT inhaler Inhale 1-2 puffs into the lungs every 6 (six) hours as needed for wheezing. 03/10/14  Yes Larene Pickett, PA-C  ciprofloxacin (CIPRO) 500 MG tablet Take 1 tablet (500 mg total) by mouth 2 (two) times daily. One po bid x 7 days 01/27/14  Yes Carrie Mew, PA-C  escitalopram (LEXAPRO) 20 MG tablet Take 20 mg by mouth every morning.    Yes Historical Provider, MD  LORazepam (ATIVAN) 1 MG tablet Take 0.5-1 mg by mouth 2 (two) times daily as needed for anxiety (anxiety).    Yes Historical Provider, MD  Menthol (HALLS COUGH DROPS MT) Use as directed 1 lozenge in the mouth or throat as needed (for cough).   Yes Historical Provider, MD  Multiple Vitamin (MULITIVITAMIN WITH MINERALS) TABS Take 1 tablet by mouth every morning.    Yes Historical Provider, MD  zolpidem (AMBIEN) 10 MG tablet Take 5  mg by mouth at bedtime as needed for sleep (sleep).    Yes Historical Provider, MD  benzonatate (TESSALON) 100 MG capsule Take 1 capsule (100 mg total) by mouth every 8 (eight) hours. Patient not taking: Reported on 04/10/2014 03/10/14   Larene Pickett, PA-C  HYDROcodone-acetaminophen (NORCO/VICODIN) 5-325 MG per tablet Take 1-2 tablets by mouth every 6 (six) hours as needed. Patient not taking: Reported on 03/10/2014 02/12/14   Hyman Bible, PA-C  metroNIDAZOLE (FLAGYL) 500 MG tablet Take 1 tablet (500 mg total) by mouth 3 (three) times daily. One po  bid x 7 days Patient not taking: Reported on 03/10/2014 01/27/14   Carrie Mew, PA-C  predniSONE (DELTASONE) 20 MG tablet Take 2 tablets (40 mg total) by mouth daily. Patient not taking: Reported on 04/10/2014 03/15/14   Glendell Docker, NP  promethazine (PHENERGAN) 25 MG tablet Take 1 tablet (25 mg total) by mouth every 6 (six) hours as needed for nausea or vomiting. Patient not taking: Reported on 06/02/2014 03/10/14   Larene Pickett, PA-C    Scheduled Meds: . enoxaparin (LOVENOX) injection  40 mg Subcutaneous QHS  . escitalopram  20 mg Oral q morning - 10a  . potassium chloride  40 mEq Oral Q4H  . sodium chloride  1,000 mL Intravenous Once   Continuous Infusions: . sodium chloride 0.9 % 1,000 mL with potassium chloride 40 mEq infusion 100 mL/hr at 06/03/14 1512   PRN Meds:.acetaminophen **OR** acetaminophen, albuterol, LORazepam, metoCLOPramide (REGLAN) injection, morphine injection, promethazine, zolpidem  Allergies as of 06/02/2014 - Review Complete 06/02/2014  Allergen Reaction Noted  . Prednisone Other (See Comments) 10/13/2012  . Amoxicillin  06/02/2014  . Butrans [buprenorphine] Other (See Comments) 09/26/2013  . Nsaids Other (See Comments) 05/16/2011  . Zofran Other (See Comments) 01/23/2011    Family History  Problem Relation Age of Onset  . Prostate cancer Father   . Coronary artery disease Maternal Grandfather     History   Social History  . Marital Status: Married    Spouse Name: N/A  . Number of Children: 3  . Years of Education: N/A   Occupational History  . Inventory in a warehouse    Social History Main Topics  . Smoking status: Current Every Day Smoker -- 0.25 packs/day for 30 years    Types: Cigarettes  . Smokeless tobacco: Never Used  . Alcohol Use: Yes     Comment: seldom  . Drug Use: Yes    Special: Barbituates  . Sexual Activity:    Partners: Male    Patent examiner Protection: Surgical   Other Topics Concern  . Not on file   Social  History Narrative    Review of Systems: All negative from GI standpoint except as stated above in HPI.  Physical Exam: Vital signs: Filed Vitals:   06/03/14 1420  BP: 113/62  Pulse: 86  Temp: 98.5 F (36.9 C)  Resp: 18   Last BM Date: 06/02/14 General:   Alert,  Well-developed, well-nourished, pleasant and cooperative in NAD HEENT: anicteric Lungs:  Clear throughout to auscultation.   No wheezes, crackles, or rhonchi. No acute distress. Heart:  Regular rate and rhythm; no murmurs, clicks, rubs,  or gallops. Abdomen: epigastric tenderness with guarding, soft, nondistended, +BS Rectal:  Deferred Ext: no edema  GI:  Lab Results:  Recent Labs  06/02/14 1621 06/03/14 0812  WBC 9.4 9.7  HGB 13.4 11.4*  HCT 39.2 33.4*  PLT 366 302   BMET  Recent Labs  06/02/14 1621 06/03/14 0812  NA 138 137  K 2.5* 3.2*  CL 103 106  CO2 23 24  GLUCOSE 93 89  BUN 10 8  CREATININE 0.60 0.57  CALCIUM 9.2 7.9*   LFT  Recent Labs  06/02/14 1621  PROT 7.5  ALBUMIN 4.4  AST 20  ALT 16  ALKPHOS 162*  BILITOT 0.6   PT/INR No results for input(s): LABPROT, INR in the last 72 hours.   Studies/Results: Ct Angio Chest Pe W/cm &/or Wo Cm  06/03/2014   CLINICAL DATA:  RIGHT upper quadrant and epigastric pain with nausea and vomiting for 3-4 days increased yesterday, chest pain, smoker, history of collagenous colitis, absent disease, pancreatitis, Wolff-Parkinson-White syndrome, irritable bowel syndrome, kidney stones, multiple prior surgeries  EXAM: CT ANGIOGRAPHY CHEST  CT ABDOMEN AND PELVIS WITH CONTRAST  TECHNIQUE: Multidetector CT imaging of the chest was performed using the standard protocol during bolus administration of intravenous contrast. Multiplanar CT image reconstructions and MIPs were obtained to evaluate the vascular anatomy. Multidetector CT imaging of the abdomen and pelvis was performed using the standard protocol during bolus administration of intravenous contrast.  Sagittal and coronal MPR images reconstructed from axial data set.  CONTRAST:  136mL OMNIPAQUE IOHEXOL 350 MG/ML SOLN IV  COMPARISON:  04/10/2014 CTA chest abdomen pelvis  FINDINGS: CTA CHEST FINDINGS  Aorta normal caliber without aneurysm or dissection.  No thoracic adenopathy.  Pulmonary arteries well opacified and patent.  No evidence pulmonary embolism.  Azygos fissure noted.  Small focus of infiltrate or scarring in medial aspect of RIGHT middle lobe image 51.  Tiny nodular density minor fissure image 41.  Few tiny nonspecific LEFT lower lobe nodules.  No additional infiltrate, pleural effusion or pneumothorax.  Underlying emphysematous changes.  No acute osseous findings.  CT ABDOMEN and PELVIS FINDINGS  Gallbladder and uterus surgically absent.  BILATERAL nonobstructing renal calculi.  Liver, spleen, pancreas, kidneys, and adrenal glands otherwise unremarkable.  Normal appendix and ovaries.  Stomach decompressed, grossly unremarkable.  Scattered areas of mild nonspecific colonic wall thickening to rectum.  Questionable area of more prominent colonic wall thickening is seen at the splenic flexure, potentially artifact from incomplete distention or contraction but mass not excluded.  Unremarkable small bowel loops.  Bladder and ureters unremarkable.  No mass, adenopathy, free fluid, free air, inflammatory process or hernia.  Osseous structures unremarkable.  Review of the MIP images confirms the above findings.  IMPRESSION: No evidence of pulmonary embolism.  Tiny LEFT lung nodules, recommendation below.  Minimal infiltrate or scarring in the medial RIGHT middle lobe.  If the patient is at high risk for bronchogenic carcinoma, follow-up chest CT at 1 year is recommended. If the patient is at low risk, no follow-up is needed. This recommendation follows the consensus statement: Guidelines for Management of Small Pulmonary Nodules Detected on CT Scans: A Statement from the [place] as published in  Radiology 2005; 237:395-400.  Multiple BILATERAL nonobstructing renal calculi.  Mild scattered wall thickening of the colon diffusely which may be related to history of collagenous colitis with an additional area of more prominent thickening at the splenic flexure for which mass cannot be excluded ; colonoscopy evaluation recommended to exclude splenic flexure tumor.   Electronically Signed   By: Lavonia Dana M.D.   On: 06/03/2014 07:46   Ct Abdomen Pelvis W Contrast  06/03/2014   CLINICAL DATA:  RIGHT upper quadrant and epigastric pain with nausea and vomiting for 3-4 days increased yesterday, chest pain, smoker,  history of collagenous colitis, absent disease, pancreatitis, Wolff-Parkinson-White syndrome, irritable bowel syndrome, kidney stones, multiple prior surgeries  EXAM: CT ANGIOGRAPHY CHEST  CT ABDOMEN AND PELVIS WITH CONTRAST  TECHNIQUE: Multidetector CT imaging of the chest was performed using the standard protocol during bolus administration of intravenous contrast. Multiplanar CT image reconstructions and MIPs were obtained to evaluate the vascular anatomy. Multidetector CT imaging of the abdomen and pelvis was performed using the standard protocol during bolus administration of intravenous contrast. Sagittal and coronal MPR images reconstructed from axial data set.  CONTRAST:  124mL OMNIPAQUE IOHEXOL 350 MG/ML SOLN IV  COMPARISON:  04/10/2014 CTA chest abdomen pelvis  FINDINGS: CTA CHEST FINDINGS  Aorta normal caliber without aneurysm or dissection.  No thoracic adenopathy.  Pulmonary arteries well opacified and patent.  No evidence pulmonary embolism.  Azygos fissure noted.  Small focus of infiltrate or scarring in medial aspect of RIGHT middle lobe image 51.  Tiny nodular density minor fissure image 41.  Few tiny nonspecific LEFT lower lobe nodules.  No additional infiltrate, pleural effusion or pneumothorax.  Underlying emphysematous changes.  No acute osseous findings.  CT ABDOMEN and PELVIS  FINDINGS  Gallbladder and uterus surgically absent.  BILATERAL nonobstructing renal calculi.  Liver, spleen, pancreas, kidneys, and adrenal glands otherwise unremarkable.  Normal appendix and ovaries.  Stomach decompressed, grossly unremarkable.  Scattered areas of mild nonspecific colonic wall thickening to rectum.  Questionable area of more prominent colonic wall thickening is seen at the splenic flexure, potentially artifact from incomplete distention or contraction but mass not excluded.  Unremarkable small bowel loops.  Bladder and ureters unremarkable.  No mass, adenopathy, free fluid, free air, inflammatory process or hernia.  Osseous structures unremarkable.  Review of the MIP images confirms the above findings.  IMPRESSION: No evidence of pulmonary embolism.  Tiny LEFT lung nodules, recommendation below.  Minimal infiltrate or scarring in the medial RIGHT middle lobe.  If the patient is at high risk for bronchogenic carcinoma, follow-up chest CT at 1 year is recommended. If the patient is at low risk, no follow-up is needed. This recommendation follows the consensus statement: Guidelines for Management of Small Pulmonary Nodules Detected on CT Scans: A Statement from the [place] as published in Radiology 2005; 237:395-400.  Multiple BILATERAL nonobstructing renal calculi.  Mild scattered wall thickening of the colon diffusely which may be related to history of collagenous colitis with an additional area of more prominent thickening at the splenic flexure for which mass cannot be excluded ; colonoscopy evaluation recommended to exclude splenic flexure tumor.   Electronically Signed   By: Lavonia Dana M.D.   On: 06/03/2014 07:46   US Abdomen Limited  06/02/2014   CLINICAL DATA:  Right upper quadrant pain, epigastric pain for 2 days  EXAM: US ABDOMEN LIMITED - RIGHT UPPER QUADRANT  COMPARISON:  None.  FINDINGS: Gallbladder:  Surgically absent  Common bile duct:  Diameter: 10 mm in diameter mild  prominent in size probable postcholecystectomy  Liver:  No focal lesion identified. Mild intrahepatic biliary ductal dilatation probable post cholecystectomy.  IMPRESSION: 1. Surgically absent gallbladder. Mild intrahepatic and extrahepatic biliary ductal dilatation probable postcholecystectomy.   Electronically Signed   By: Lahoma Crocker M.D.   On: 06/02/2014 22:27   Dg Abd Acute W/chest  06/02/2014   CLINICAL DATA:  Abdominal pain, nausea, vomiting  EXAM: ACUTE ABDOMEN SERIES (ABDOMEN 2 VIEW & CHEST 1 VIEW)  COMPARISON:  03/15/2014  FINDINGS: No free abdominal air is noted. Normal small  bowel gas pattern. Postcholecystectomy surgical clips are noted. No radiopaque calculi are noted. There is mild colonic gas in right colon and descending colon. Moderate colonic gas in transverse colon. Heart size and mediastinal contours are within normal limits. Both lungs are clear.  IMPRESSION: No acute disease within chest. Normal small bowel gas pattern. Mild colonic gas is noted in right colon and descending colon. Moderate colonic gas in transverse colon. No free abdominal air.   Electronically Signed   By: Lahoma Crocker M.D.   On: 06/02/2014 19:19    Impression/Plan: N/V/abdominal pain/Diarrhea - question gastroenteritis vs. IBS vs. Infection. GI pathogen panel pending and C. Diff negative. Doubt peptic ulcer disease. No evidence of pancreatitis or biliary process on CT and labs. Suspect splenic flexure appearance on CT due to underdistention especially with normal colonoscopy in 2014. EGD tomorrow to look for peptic ulcer disease. NPO p MN.    LOS: 1 day   [place] C.  06/03/2014, 5:22 PM

## 2014-06-03 NOTE — Care Management Note (Addendum)
    Page 1 of 1   06/05/2014     12:37:17 PM CARE MANAGEMENT NOTE 06/05/2014  Patient:  LAVERLE, PILLARD   Account Number:  [id number]  Date Initiated:  06/03/2014  Documentation initiated by:  Dessa Phi  Subjective/Objective Assessment:   49 y/o f admitted w/n/[id number].     Action/Plan:   From home.   Anticipated DC Date:  06/05/2014   Anticipated DC Plan:  [place]  CM consult      Choice offered to / List presented to:             Status of service:  Completed, signed off Medicare Important Message given?   (If response is "NO", the following Medicare IM given date fields will be blank) Date Medicare IM given:   Medicare IM given by:   Date Additional Medicare IM given:   Additional Medicare IM given by:    Discharge Disposition:  HOME/SELF CARE  Per UR Regulation:  Reviewed for med. necessity/level of care/duration of stay  If discussed at [place] of Stay Meetings, dates discussed:    Comments:  06/05/14 Dessa Phi RN BSN NCM [phone] d/c home no needs or orders.  06/03/14 Dessa Phi RN NCM [phone] Monitor progress.d/c plan home.

## 2014-06-03 NOTE — Progress Notes (Signed)
[facility] PROGRESS NOTE  Pretty Weltman JXB:[id number] DOB: 10-07-1965 DOA: 06/02/2014 PCP: No PCP Per Patient  Assessment/Plan: #1 nausea/ vomiting/ diarrhea /abdominal pain Questionable etiology. Patient complaining of abdominal pain more epigastric radiating to the back. On physical exam patient with diffuse upper abdominal pain. Patient also with increased frequency of her chronic diarrhea. Concern for a flare of microscopic colitis. CT abdomen and pelvis with mild scattered wall thickening of the colon diffusely with an additional area of more prominent thickening of the splenic flexure for which mass cannot be excluded. C. difficile PCR was negative. Check a GI stool pathogen. Continue IV fluids, pain management, supportive care. Consult with GI for further evaluation and management.  #2 hypokalemia Likely secondary to GI losses. Replete.  #3 chest pain Patient denies any current chest pain. D-dimer was elevated and a such CT angiogram of the chest was done which was negative for PE.  #4 history of anxiety Continue current regimen.  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  Code Status: Full Family Communication: Updated patient. No family present. Disposition Plan: Remain inpatient.   Consultants:  None  Procedures:  CT angiogram chest 06/03/2014  CT abdomen and pelvis 06/03/2014  Acute abdominal series 06/02/2014  Abdominal ultrasound 06/02/2014  Antibiotics: None  HPI/Subjective: Patient complaining of upper abdominal pain especially more epigastric pain radiating to her back. Patient also complaining of nausea. Patient states has had an increased frequency of loose chronic stools.  Objective: Filed Vitals:   06/03/14 1420  BP: 113/62  Pulse: 86  Temp: 98.5 F (36.9 C)  Resp: 18    Intake/Output Summary (Last 24 hours) at 06/03/14 1712 Last data filed at 06/03/14 1422  Gross per 24 hour   Intake   1920 ml  Output      0 ml  Net   1920 ml   Filed Weights   06/02/14 2352  Weight: 56.745 kg (125 lb 1.6 oz)    Exam:   General:  NAD  Cardiovascular: RRR  Respiratory: CTAB  Abdomen: Soft, tender to palpation throughout the upper abdomen, nondistended, positive bowel sounds.  Musculoskeletal: No clubbing cyanosis or edema.  Data Reviewed: Basic Metabolic Panel:  Recent Labs Lab 06/02/14 1621 06/03/14 0812  NA 138 137  K 2.5* 3.2*  CL 103 106  CO2 23 24  GLUCOSE 93 89  BUN 10 8  CREATININE 0.60 0.57  CALCIUM 9.2 7.9*  MG  --  1.9   Liver Function Tests:  Recent Labs Lab 06/02/14 1621  AST 20  ALT 16  ALKPHOS 162*  BILITOT 0.6  PROT 7.5  ALBUMIN 4.4    Recent Labs Lab 06/02/14 1621  LIPASE 22   No results for input(s): AMMONIA in the last 168 hours. CBC:  Recent Labs Lab 06/02/14 1621 06/03/14 0812  WBC 9.4 9.7  NEUTROABS 5.7 6.5  HGB 13.4 11.4*  HCT 39.2 33.4*  MCV 103.4* 105.0*  PLT 366 302   Cardiac Enzymes:  Recent Labs Lab 06/02/14 2319  TROPONINI <0.03   BNP (last 3 results)  Recent Labs  04/10/14 1025  BNP 51.4    ProBNP (last 3 results)  Recent Labs  03/10/14 1201  PROBNP 227.7*    CBG:  Recent Labs Lab 06/03/14 0051 06/03/14 0615 06/03/14 1151 06/03/14 1224  GLUCAP 79 70 67* 156*    Recent Results (from the past 240 hour(s))  Clostridium Difficile by PCR     Status: None  Collection Time: 06/03/14  1:08 AM  Result Value Ref Range Status   C difficile by pcr NEGATIVE NEGATIVE Final    Comment: Performed at [facility]     Studies: Ct Angio Chest Pe W/cm &/or Wo Cm  06/03/2014   CLINICAL DATA:  RIGHT upper quadrant and epigastric pain with nausea and vomiting for 3-4 days increased yesterday, chest pain, smoker, history of collagenous colitis, absent disease, pancreatitis, Wolff-Parkinson-White syndrome, irritable bowel syndrome, kidney stones, multiple prior surgeries  EXAM: CT  ANGIOGRAPHY CHEST  CT ABDOMEN AND PELVIS WITH CONTRAST  TECHNIQUE: Multidetector CT imaging of the chest was performed using the standard protocol during bolus administration of intravenous contrast. Multiplanar CT image reconstructions and MIPs were obtained to evaluate the vascular anatomy. Multidetector CT imaging of the abdomen and pelvis was performed using the standard protocol during bolus administration of intravenous contrast. Sagittal and coronal MPR images reconstructed from axial data set.  CONTRAST:  117mL OMNIPAQUE IOHEXOL 350 MG/ML SOLN IV  COMPARISON:  04/10/2014 CTA chest abdomen pelvis  FINDINGS: CTA CHEST FINDINGS  Aorta normal caliber without aneurysm or dissection.  No thoracic adenopathy.  Pulmonary arteries well opacified and patent.  No evidence pulmonary embolism.  Azygos fissure noted.  Small focus of infiltrate or scarring in medial aspect of RIGHT middle lobe image 51.  Tiny nodular density minor fissure image 41.  Few tiny nonspecific LEFT lower lobe nodules.  No additional infiltrate, pleural effusion or pneumothorax.  Underlying emphysematous changes.  No acute osseous findings.  CT ABDOMEN and PELVIS FINDINGS  Gallbladder and uterus surgically absent.  BILATERAL nonobstructing renal calculi.  Liver, spleen, pancreas, kidneys, and adrenal glands otherwise unremarkable.  Normal appendix and ovaries.  Stomach decompressed, grossly unremarkable.  Scattered areas of mild nonspecific colonic wall thickening to rectum.  Questionable area of more prominent colonic wall thickening is seen at the splenic flexure, potentially artifact from incomplete distention or contraction but mass not excluded.  Unremarkable small bowel loops.  Bladder and ureters unremarkable.  No mass, adenopathy, free fluid, free air, inflammatory process or hernia.  Osseous structures unremarkable.  Review of the MIP images confirms the above findings.  IMPRESSION: No evidence of pulmonary embolism.  Tiny LEFT lung  nodules, recommendation below.  Minimal infiltrate or scarring in the medial RIGHT middle lobe.  If the patient is at high risk for bronchogenic carcinoma, follow-up chest CT at 1 year is recommended. If the patient is at low risk, no follow-up is needed. This recommendation follows the consensus statement: Guidelines for Management of Small Pulmonary Nodules Detected on CT Scans: A Statement from the [place] as published in Radiology 2005; 237:395-400.  Multiple BILATERAL nonobstructing renal calculi.  Mild scattered wall thickening of the colon diffusely which may be related to history of collagenous colitis with an additional area of more prominent thickening at the splenic flexure for which mass cannot be excluded ; colonoscopy evaluation recommended to exclude splenic flexure tumor.   Electronically Signed   By: Lavonia Dana M.D.   On: 06/03/2014 07:46   Ct Abdomen Pelvis W Contrast  06/03/2014   CLINICAL DATA:  RIGHT upper quadrant and epigastric pain with nausea and vomiting for 3-4 days increased yesterday, chest pain, smoker, history of collagenous colitis, absent disease, pancreatitis, Wolff-Parkinson-White syndrome, irritable bowel syndrome, kidney stones, multiple prior surgeries  EXAM: CT ANGIOGRAPHY CHEST  CT ABDOMEN AND PELVIS WITH CONTRAST  TECHNIQUE: Multidetector CT imaging of the chest was performed using the standard protocol during bolus  administration of intravenous contrast. Multiplanar CT image reconstructions and MIPs were obtained to evaluate the vascular anatomy. Multidetector CT imaging of the abdomen and pelvis was performed using the standard protocol during bolus administration of intravenous contrast. Sagittal and coronal MPR images reconstructed from axial data set.  CONTRAST:  189mL OMNIPAQUE IOHEXOL 350 MG/ML SOLN IV  COMPARISON:  04/10/2014 CTA chest abdomen pelvis  FINDINGS: CTA CHEST FINDINGS  Aorta normal caliber without aneurysm or dissection.  No thoracic  adenopathy.  Pulmonary arteries well opacified and patent.  No evidence pulmonary embolism.  Azygos fissure noted.  Small focus of infiltrate or scarring in medial aspect of RIGHT middle lobe image 51.  Tiny nodular density minor fissure image 41.  Few tiny nonspecific LEFT lower lobe nodules.  No additional infiltrate, pleural effusion or pneumothorax.  Underlying emphysematous changes.  No acute osseous findings.  CT ABDOMEN and PELVIS FINDINGS  Gallbladder and uterus surgically absent.  BILATERAL nonobstructing renal calculi.  Liver, spleen, pancreas, kidneys, and adrenal glands otherwise unremarkable.  Normal appendix and ovaries.  Stomach decompressed, grossly unremarkable.  Scattered areas of mild nonspecific colonic wall thickening to rectum.  Questionable area of more prominent colonic wall thickening is seen at the splenic flexure, potentially artifact from incomplete distention or contraction but mass not excluded.  Unremarkable small bowel loops.  Bladder and ureters unremarkable.  No mass, adenopathy, free fluid, free air, inflammatory process or hernia.  Osseous structures unremarkable.  Review of the MIP images confirms the above findings.  IMPRESSION: No evidence of pulmonary embolism.  Tiny LEFT lung nodules, recommendation below.  Minimal infiltrate or scarring in the medial RIGHT middle lobe.  If the patient is at high risk for bronchogenic carcinoma, follow-up chest CT at 1 year is recommended. If the patient is at low risk, no follow-up is needed. This recommendation follows the consensus statement: Guidelines for Management of Small Pulmonary Nodules Detected on CT Scans: A Statement from the [place] as published in Radiology 2005; 237:395-400.  Multiple BILATERAL nonobstructing renal calculi.  Mild scattered wall thickening of the colon diffusely which may be related to history of collagenous colitis with an additional area of more prominent thickening at the splenic flexure for  which mass cannot be excluded ; colonoscopy evaluation recommended to exclude splenic flexure tumor.   Electronically Signed   By: Lavonia Dana M.D.   On: 06/03/2014 07:46   US Abdomen Limited  06/02/2014   CLINICAL DATA:  Right upper quadrant pain, epigastric pain for 2 days  EXAM: US ABDOMEN LIMITED - RIGHT UPPER QUADRANT  COMPARISON:  None.  FINDINGS: Gallbladder:  Surgically absent  Common bile duct:  Diameter: 10 mm in diameter mild prominent in size probable postcholecystectomy  Liver:  No focal lesion identified. Mild intrahepatic biliary ductal dilatation probable post cholecystectomy.  IMPRESSION: 1. Surgically absent gallbladder. Mild intrahepatic and extrahepatic biliary ductal dilatation probable postcholecystectomy.   Electronically Signed   By: Lahoma Crocker M.D.   On: 06/02/2014 22:27   Dg Abd Acute W/chest  06/02/2014   CLINICAL DATA:  Abdominal pain, nausea, vomiting  EXAM: ACUTE ABDOMEN SERIES (ABDOMEN 2 VIEW & CHEST 1 VIEW)  COMPARISON:  03/15/2014  FINDINGS: No free abdominal air is noted. Normal small bowel gas pattern. Postcholecystectomy surgical clips are noted. No radiopaque calculi are noted. There is mild colonic gas in right colon and descending colon. Moderate colonic gas in transverse colon. Heart size and mediastinal contours are within normal limits. Both lungs are clear.  IMPRESSION:  No acute disease within chest. Normal small bowel gas pattern. Mild colonic gas is noted in right colon and descending colon. Moderate colonic gas in transverse colon. No free abdominal air.   Electronically Signed   By: Lahoma Crocker M.D.   On: 06/02/2014 19:19    Scheduled Meds: . enoxaparin (LOVENOX) injection  40 mg Subcutaneous QHS  . escitalopram  20 mg Oral q morning - 10a  . potassium chloride  40 mEq Oral Q4H  . sodium chloride  1,000 mL Intravenous Once   Continuous Infusions: . sodium chloride 0.9 % 1,000 mL with potassium chloride 40 mEq infusion 100 mL/hr at 06/03/14 1512     Principal Problem:   Nausea with vomiting Active Problems:   Diarrhea   Epigastric pain   Collagenous colitis   Hypokalemia   Anxiety disorder   Nausea & vomiting    Time spent: 35 mins    [facility] MD [facility] Pager [phone]. If 7PM-7AM, please contact night-coverage at [email or web address], password [facility] 06/03/2014, 5:12 PM  LOS: 1 day

## 2014-06-03 NOTE — Progress Notes (Signed)
Nutrition Brief Note  Patient identified on the Malnutrition Screening Tool (MST) Report  Pt's weight has remained stable with some weight gain. Pt reports N/V x 3 days. Prior to those symptoms she was eating well with good appetite. UBW is 130 lb.  Wt Readings from Last 15 Encounters:  06/02/14 125 lb 1.6 oz (56.745 kg)  03/15/14 126 lb (57.153 kg)  01/07/14 126 lb (57.153 kg)  12/17/13 121 lb (54.885 kg)  11/16/13 125 lb (56.7 kg)  08/15/13 115 lb (52.164 kg)  08/12/13 113 lb (51.256 kg)  08/08/13 110 lb (49.896 kg)  08/08/13 112 lb 9.6 oz (51.075 kg)  05/09/13 115 lb (52.164 kg)  03/13/13 119 lb (53.978 kg)  03/12/13 112 lb (50.803 kg)  03/06/13 112 lb (50.803 kg)  11/02/12 133 lb (60.328 kg)  10/28/12 133 lb (60.328 kg)    Body mass index is 21.46 kg/(m^2). Patient meets criteria for normal range based on current BMI.   Current diet order is Clear liquids, patient is consuming approximately 75% of meals at this time. Labs and medications reviewed.   No nutrition interventions warranted at this time. If nutrition issues arise, please consult RD.   Clayton Bibles, MS, RD, LDN Pager: [phone] After Hours Pager: [phone]

## 2014-06-03 NOTE — Progress Notes (Signed)
Dear Doctor: Dawn Frost This patient has been identified as a candidate for PICC for the following reason (s): drug pH or osmolality (causing phlebitis, infiltration in 24 hours) If you agree, please write an order for the indicated device. For any questions contact the Vascular Access Team at [phone] if no answer, please leave a message.  Thank you for supporting the early vascular access assessment program.

## 2014-06-04 ENCOUNTER — Encounter (HOSPITAL_COMMUNITY): Payer: Self-pay | Admitting: Gastroenterology

## 2014-06-04 ENCOUNTER — Inpatient Hospital Stay (HOSPITAL_COMMUNITY): Payer: BLUE CROSS/BLUE SHIELD | Admitting: Anesthesiology

## 2014-06-04 ENCOUNTER — Encounter (HOSPITAL_COMMUNITY)
Admission: EM | Disposition: A | Discharge status: Home / Self Care | Payer: Self-pay | Source: Home / Self Care | Attending: Internal Medicine

## 2014-06-04 DIAGNOSIS — G43A1 Cyclical vomiting, intractable: Secondary | ICD-10-CM

## 2014-06-04 HISTORY — PX: ESOPHAGOGASTRODUODENOSCOPY (EGD) WITH PROPOFOL: SHX5813

## 2014-06-04 LAB — BASIC METABOLIC PANEL
ANION GAP: 3 — AB (ref 5–15)
CALCIUM: 8.1 mg/dL — AB (ref 8.4–10.5)
CHLORIDE: 120 mmol/L — AB (ref 96–112)
CO2: 20 mmol/L (ref 19–32)
CREATININE: 0.53 mg/dL (ref 0.50–1.10)
GFR calc Af Amer: >90 mL/min (ref >90)
GFR calc non Af Amer: >90 mL/min (ref >90)
GLUCOSE: 86 mg/dL (ref 70–99)
Potassium: 4.6 mmol/L (ref 3.5–5.1)
SODIUM: 143 mmol/L (ref 135–145)

## 2014-06-04 LAB — CBC WITH DIFFERENTIAL/PLATELET
BASOS ABS: 0.1 10*3/uL (ref 0.0–0.1)
Basophils Relative: 1 % (ref 0–1)
EOS ABS: 0.9 10*3/uL — AB (ref 0.0–0.7)
Eosinophils Relative: 16 % — ABNORMAL HIGH (ref 0–5)
HCT: 34.1 % — ABNORMAL LOW (ref 36.0–46.0)
HEMOGLOBIN: 11.3 g/dL — AB (ref 12.0–15.0)
Lymphocytes Relative: 27 % (ref 12–46)
Lymphs Abs: 1.4 10*3/uL (ref 0.7–4.0)
MCH: 35.5 pg — AB (ref 26.0–34.0)
MCHC: 33.1 g/dL (ref 30.0–36.0)
MCV: 107.2 fL — ABNORMAL HIGH (ref 78.0–100.0)
Monocytes Absolute: 0.4 10*3/uL (ref 0.1–1.0)
Monocytes Relative: 7 % (ref 3–12)
NEUTROS PCT: 49 % (ref 43–77)
Neutro Abs: 2.5 10*3/uL (ref 1.7–7.7)
Platelets: 270 10*3/uL (ref 150–400)
RBC: 3.18 MIL/uL — ABNORMAL LOW (ref 3.87–5.11)
RDW: 13.7 % (ref 11.5–15.5)
WBC: 5.2 10*3/uL (ref 4.0–10.5)

## 2014-06-04 LAB — GLUCOSE, CAPILLARY
GLUCOSE-CAPILLARY: 141 mg/dL — AB (ref 70–99)
Glucose-Capillary: 85 mg/dL (ref 70–99)
Glucose-Capillary: 89 mg/dL (ref 70–99)

## 2014-06-04 SURGERY — ESOPHAGOGASTRODUODENOSCOPY (EGD) WITH PROPOFOL
Anesthesia: Monitor Anesthesia Care

## 2014-06-04 MED ORDER — PROPOFOL 10 MG/ML IV BOLUS
INTRAVENOUS | Status: AC
  Filled 2014-06-04: qty 20

## 2014-06-04 MED ORDER — BUDESONIDE 3 MG PO CP24
9.0000 mg | ORAL_CAPSULE | Freq: Every day | ORAL | Status: DC
  Administered 2014-06-04 – 2014-06-05 (×2): 9 mg via ORAL
  Filled 2014-06-04 (×2): qty 3

## 2014-06-04 MED ORDER — PANTOPRAZOLE SODIUM 40 MG IV SOLR
40.0000 mg | Freq: Two times a day (BID) | INTRAVENOUS | Status: DC
  Administered 2014-06-04 – 2014-06-05 (×2): 40 mg via INTRAVENOUS
  Filled 2014-06-04 (×3): qty 40

## 2014-06-04 MED ORDER — PROMETHAZINE HCL 25 MG/ML IJ SOLN
12.5000 mg | Freq: Once | INTRAMUSCULAR | Status: AC
  Administered 2014-06-04: 12.5 mg via INTRAVENOUS

## 2014-06-04 MED ORDER — PROMETHAZINE HCL 25 MG/ML IJ SOLN
INTRAMUSCULAR | Status: AC
  Filled 2014-06-04: qty 1

## 2014-06-04 MED ORDER — SODIUM CHLORIDE 0.9 % IV SOLN: INTRAVENOUS | Status: DC

## 2014-06-04 MED ORDER — LACTATED RINGERS IV SOLN: INTRAVENOUS | Status: DC

## 2014-06-04 MED ORDER — LACTATED RINGERS IV SOLN
INTRAVENOUS | Status: DC | PRN
  Administered 2014-06-04: 12:00:00 via INTRAVENOUS

## 2014-06-04 MED ORDER — PANTOPRAZOLE SODIUM 40 MG IV SOLR
40.0000 mg | Freq: Two times a day (BID) | INTRAVENOUS | Status: DC
  Administered 2014-06-04: 40 mg via INTRAVENOUS
  Filled 2014-06-04 (×2): qty 40

## 2014-06-04 MED ORDER — PROPOFOL INFUSION 10 MG/ML OPTIME
INTRAVENOUS | Status: DC | PRN
  Administered 2014-06-04: 300 ug/kg/min via INTRAVENOUS

## 2014-06-04 SURGICAL SUPPLY — 14 items

## 2014-06-04 NOTE — Anesthesia Postprocedure Evaluation (Signed)
  Anesthesia Post-op Note  Patient: Dawn Frost  Procedure(s) Performed: Procedure(s) (LRB): ESOPHAGOGASTRODUODENOSCOPY (EGD) WITH PROPOFOL (N/A)  Patient Location: PACU  Anesthesia Type: MAC  Level of Consciousness: awake and alert   Airway and Oxygen Therapy: Patient Spontanous Breathing  Post-op Pain: mild  Post-op Assessment: Post-op Vital signs reviewed, Patient's Cardiovascular Status Stable, Respiratory Function Stable, Patent Airway and No signs of Nausea or vomiting  Last Vitals:  Filed Vitals:   06/04/14 1400  BP: 138/87  Pulse: 66  Temp: 36.8 C  Resp: 18    Post-op Vital Signs: stable   Complications: No apparent anesthesia complications

## 2014-06-04 NOTE — Op Note (Signed)
[facility] [place], [address]   ENDOSCOPY PROCEDURE REPORT  PATIENT: Dawn, Frost  MR#: [id number] BIRTHDATE: 1965/08/27 , 48  yrs. old GENDER: female ENDOSCOPIST: Clarene Essex, MD REFERRED BY: PROCEDURE DATE:  2014-06-22 PROCEDURE:  EGD w/ biopsy ASA CLASS:     Class II INDICATIONS:  epigastric abdominal pain, nausea, and vomiting. MEDICATIONS: Propofol 300 mg IV TOPICAL ANESTHETIC: none  DESCRIPTION OF PROCEDURE: After the risks benefits and alternatives of the procedure were thoroughly explained, informed consent was obtained.  The Pentax Gastroscope [id number] endoscope was introduced through the mouth and advanced to the second portion of the duodenum , Without limitations.  The instrument was slowly withdrawn as the mucosa was fully examined.    The findings are recorded below unfortunately the picture mechanism was not functioning so photo documentation was not obtained Retroflexed views revealed a hiatal hernia.     The scope was then withdrawn from the patient and the procedure completed.  COMPLICATIONS: There were no immediate complications.  ENDOSCOPIC IMPRESSION: 1. Small hiatal hernia 2. 2 small antral ulcers status post biopsy 3. Otherwise within normal limits to the third part of the duodenum with biopsies of the fundus to rule out H. pylori as well  RECOMMENDATIONS: slowly advance diet await pathology continue outpatient pump inhibitors and resume Entocort for her colitis and happy to see back when necessary  REPEAT EXAM: as needed  eSigned:  Clarene Essex, MD June 22, 2014 1:27 PM    CC:  CPT CODES: ICD CODES:  The ICD and CPT codes recommended by this software are interpretations from the data that the clinical staff has captured with the software.  The verification of the translation of this report to the ICD and CPT codes and modifiers is the sole responsibility of the health care institution and  practicing physician where this report was generated.  [employer] will not be held responsible for the validity of the ICD and CPT codes included on this report.  AMA assumes no liability for data contained or not contained herein. CPT is a Designer, television/film set of the [employer].

## 2014-06-04 NOTE — Progress Notes (Signed)
Dawn Frost 12:25 PM  Subjective: Patient with midepigastric pain but decreased nausea vomiting and diarrhea unchanged helped by Entocort in the past and no new complaints and her hospital computer chart was reviewed and patient was seen and examined  Objective: Vital signs stable afebrile no acute distress exam please see preassessment evaluation labs and x-rays reviewed  Assessment: Midepigastric abdominal pain questionable etiology in patient with history of colitis  Plan: We rediscussed endoscopy and will proceed with anesthesia assistance with further workup and plans pending those findings and will resume Entocort for colitis since that has helped her in the past  [facility]

## 2014-06-04 NOTE — Anesthesia Preprocedure Evaluation (Addendum)
Anesthesia Evaluation  Patient identified by MRN, date of birth, ID band Patient awake  General Assessment Comment: Collagenous colitis  . WPW (Wolff-Parkinson-White syndrome)  . Addison disease  . Thyroid disease  . Clostridium difficile diarrhea  . Anxiety and depression  . Chronic diarrhea  . Chronic back pain  . Drug-seeking behavior  . Irritable bowel syndrome (IBS)  . Anemia  . Pancreatitis  . Kidney stone  . Kidney stone  . Depression  . Anxiety  . 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   Neuro/Psych  Headaches, PSYCHIATRIC DISORDERS Anxiety Depression    GI/Hepatic Neg liver ROS, Intractable nausea and vomiting.   Endo/Other  Hypothyroidism   Renal/GU Renal disease  negative genitourinary   Musculoskeletal negative musculoskeletal ROS (+)   Abdominal   Peds negative pediatric ROS (+)  Hematology  (+) anemia ,   Anesthesia Other Findings   Reproductive/Obstetrics negative OB ROS                            Anesthesia Physical Anesthesia Plan  ASA: III  Anesthesia Plan: MAC   Post-op Pain Management:    Induction: Intravenous  Airway Management Planned:   Additional Equipment:   Intra-op Plan:   Post-operative Plan:   Informed Consent: I have reviewed the patients History and Physical, chart, labs and discussed the procedure including the risks, benefits and alternatives for the proposed anesthesia with the patient or authorized  representative who has indicated his/her understanding and acceptance.   Dental advisory given  Plan Discussed with: CRNA  Anesthesia Plan Comments:         Anesthesia Quick Evaluation

## 2014-06-04 NOTE — Transfer of Care (Signed)
Immediate Anesthesia Transfer of Care Note  Patient: Dawn Frost  Procedure(s) Performed: Procedure(s): ESOPHAGOGASTRODUODENOSCOPY (EGD) WITH PROPOFOL (N/A)  Patient Location: PACU and Endoscopy Unit  Anesthesia Type:MAC  Level of Consciousness: awake, alert , oriented and patient cooperative  Airway & Oxygen Therapy: Patient Spontanous Breathing and Patient connected to nasal cannula oxygen  Post-op Assessment: Report given to RN and Post -op Vital signs reviewed and stable  Post vital signs: Reviewed and stable  Last Vitals:  Filed Vitals:   06/04/14 1201  BP: 145/82  Pulse: 68  Temp: 36.7 C  Resp: 11    Complications: No apparent anesthesia complications

## 2014-06-04 NOTE — Progress Notes (Deleted)
NGT inserted earlier tonight after patient complained of increasing abdominal pain, increasing nausea, more distended and firm abdomen with lesser bowel sounds from yesterday. NGT placement double checked with another RN,  connected to LIWS and small amount of light greenish gastric content came out but somewhat relieved the patient with nausea and lessen her abdominal pain.

## 2014-06-04 NOTE — Progress Notes (Signed)
[facility] PROGRESS NOTE  Assessment/Plan: Intractable Nausea with vomiting/  Epigastric pain: - Unclear etiology, CT scan of the abdomen and pelvis show mild scattered wall thickening of the colon diffusely. - C. dif PCR was negative, GI stool pathogens are pending, she is on IV fluids. - EGD on 06/14/2014. - Continue PPI.  Hypokalemia: - Likely secondary to GI losses continue to replete, magnesium level 1.9.  History of anxiety: Continue current regimen.    Code Status: full Family Communication: none  Disposition Plan: inpatient   Consultants:  GI  Procedures:  egd  Antibiotics:  None  HPI/Subjective: Complaining of epigastric pain. Bad mouth taste in the morning.  Objective: Filed Vitals:   06/03/14 0444 06/03/14 1420 06/03/14 2101 06/04/14 0502  BP: 104/64 113/62 119/75 107/66  Pulse: 56 86 69 64  Temp: 98 F (36.7 C) 98.5 F (36.9 C) 98.3 F (36.8 C) 98 F (36.7 C)  TempSrc: Oral Oral Oral Oral  Resp: 18 18 20 19   Height:      Weight:      SpO2: 97% 98% 98% 98%    Intake/Output Summary (Last 24 hours) at 06/04/14 1002 Last data filed at 06/04/14 0900  Gross per 24 hour  Intake 2652.5 ml  Output      0 ml  Net 2652.5 ml   Filed Weights   06/02/14 2352  Weight: 56.745 kg (125 lb 1.6 oz)    Exam:  General: Alert, awake, oriented x3, in no acute distress.  HEENT: No bruits, no goiter.  Heart: Regular rate and rhythm. Lungs: Good air movement,  clear Abdomen: Soft, Epigastric nondistended, positive bowel sounds.  Neuro: Grossly intact, nonfocal.   Data Reviewed: Basic Metabolic Panel:  Recent Labs Lab 06/02/14 1621 06/03/14 0812  NA 138 137  K 2.5* 3.2*  CL 103 106  CO2 23 24  GLUCOSE 93 89  BUN 10 8  CREATININE 0.60 0.57  CALCIUM 9.2 7.9*  MG  --  1.9   Liver Function Tests:  Recent Labs Lab 06/02/14 1621  AST 20  ALT 16  ALKPHOS 162*  BILITOT 0.6  PROT 7.5  ALBUMIN 4.4    Recent Labs Lab  06/02/14 1621 06/03/14 0812  LIPASE 22 42   No results for input(s): AMMONIA in the last 168 hours. CBC:  Recent Labs Lab 06/02/14 1621 06/03/14 0812 06/04/14 0820  WBC 9.4 9.7 5.2  NEUTROABS 5.7 6.5 2.5  HGB 13.4 11.4* 11.3*  HCT 39.2 33.4* 34.1*  MCV 103.4* 105.0* 107.2*  PLT 366 302 270   Cardiac Enzymes:  Recent Labs Lab 06/02/14 2319  TROPONINI <0.03   BNP (last 3 results)  Recent Labs  04/10/14 1025  BNP 51.4    ProBNP (last 3 results)  Recent Labs  03/10/14 1201  PROBNP 227.7*    CBG:  Recent Labs Lab 06/03/14 1151 06/03/14 1224 06/03/14 1802 06/03/14 2353 06/04/14 0533  GLUCAP 67* 156* 189* 73 89    Recent Results (from the past 240 hour(s))  Clostridium Difficile by PCR     Status: None   Collection Time: 06/03/14  1:08 AM  Result Value Ref Range Status   C difficile by pcr NEGATIVE NEGATIVE Final    Comment: Performed at [facility]  Stool culture     Status: None (Preliminary result)   Collection Time: 06/03/14  1:08 AM  Result Value Ref Range Status   Specimen Description STOOL  Final   Special Requests NONE  Final  Culture   Final    Culture reincubated for better growth Performed at [facility]    Report Status PENDING  Incomplete     Studies: Ct Angio Chest Pe W/cm &/or Wo Cm  06/03/2014   CLINICAL DATA:  RIGHT upper quadrant and epigastric pain with nausea and vomiting for 3-4 days increased yesterday, chest pain, smoker, history of collagenous colitis, absent disease, pancreatitis, Wolff-Parkinson-White syndrome, irritable bowel syndrome, kidney stones, multiple prior surgeries  EXAM: CT ANGIOGRAPHY CHEST  CT ABDOMEN AND PELVIS WITH CONTRAST  TECHNIQUE: Multidetector CT imaging of the chest was performed using the standard protocol during bolus administration of intravenous contrast. Multiplanar CT image reconstructions and MIPs were obtained to evaluate the vascular anatomy. Multidetector CT imaging of the  abdomen and pelvis was performed using the standard protocol during bolus administration of intravenous contrast. Sagittal and coronal MPR images reconstructed from axial data set.  CONTRAST:  131mL OMNIPAQUE IOHEXOL 350 MG/ML SOLN IV  COMPARISON:  04/10/2014 CTA chest abdomen pelvis  FINDINGS: CTA CHEST FINDINGS  Aorta normal caliber without aneurysm or dissection.  No thoracic adenopathy.  Pulmonary arteries well opacified and patent.  No evidence pulmonary embolism.  Azygos fissure noted.  Small focus of infiltrate or scarring in medial aspect of RIGHT middle lobe image 51.  Tiny nodular density minor fissure image 41.  Few tiny nonspecific LEFT lower lobe nodules.  No additional infiltrate, pleural effusion or pneumothorax.  Underlying emphysematous changes.  No acute osseous findings.  CT ABDOMEN and PELVIS FINDINGS  Gallbladder and uterus surgically absent.  BILATERAL nonobstructing renal calculi.  Liver, spleen, pancreas, kidneys, and adrenal glands otherwise unremarkable.  Normal appendix and ovaries.  Stomach decompressed, grossly unremarkable.  Scattered areas of mild nonspecific colonic wall thickening to rectum.  Questionable area of more prominent colonic wall thickening is seen at the splenic flexure, potentially artifact from incomplete distention or contraction but mass not excluded.  Unremarkable small bowel loops.  Bladder and ureters unremarkable.  No mass, adenopathy, free fluid, free air, inflammatory process or hernia.  Osseous structures unremarkable.  Review of the MIP images confirms the above findings.  IMPRESSION: No evidence of pulmonary embolism.  Tiny LEFT lung nodules, recommendation below.  Minimal infiltrate or scarring in the medial RIGHT middle lobe.  If the patient is at high risk for bronchogenic carcinoma, follow-up chest CT at 1 year is recommended. If the patient is at low risk, no follow-up is needed. This recommendation follows the consensus statement: Guidelines for  Management of Small Pulmonary Nodules Detected on CT Scans: A Statement from the [place] as published in Radiology 2005; 237:395-400.  Multiple BILATERAL nonobstructing renal calculi.  Mild scattered wall thickening of the colon diffusely which may be related to history of collagenous colitis with an additional area of more prominent thickening at the splenic flexure for which mass cannot be excluded ; colonoscopy evaluation recommended to exclude splenic flexure tumor.   Electronically Signed   By: Lavonia Dana M.D.   On: 06/03/2014 07:46   Ct Abdomen Pelvis W Contrast  06/03/2014   CLINICAL DATA:  RIGHT upper quadrant and epigastric pain with nausea and vomiting for 3-4 days increased yesterday, chest pain, smoker, history of collagenous colitis, absent disease, pancreatitis, Wolff-Parkinson-White syndrome, irritable bowel syndrome, kidney stones, multiple prior surgeries  EXAM: CT ANGIOGRAPHY CHEST  CT ABDOMEN AND PELVIS WITH CONTRAST  TECHNIQUE: Multidetector CT imaging of the chest was performed using the standard protocol during bolus administration of intravenous contrast. Multiplanar  CT image reconstructions and MIPs were obtained to evaluate the vascular anatomy. Multidetector CT imaging of the abdomen and pelvis was performed using the standard protocol during bolus administration of intravenous contrast. Sagittal and coronal MPR images reconstructed from axial data set.  CONTRAST:  150mL OMNIPAQUE IOHEXOL 350 MG/ML SOLN IV  COMPARISON:  04/10/2014 CTA chest abdomen pelvis  FINDINGS: CTA CHEST FINDINGS  Aorta normal caliber without aneurysm or dissection.  No thoracic adenopathy.  Pulmonary arteries well opacified and patent.  No evidence pulmonary embolism.  Azygos fissure noted.  Small focus of infiltrate or scarring in medial aspect of RIGHT middle lobe image 51.  Tiny nodular density minor fissure image 41.  Few tiny nonspecific LEFT lower lobe nodules.  No additional infiltrate, pleural  effusion or pneumothorax.  Underlying emphysematous changes.  No acute osseous findings.  CT ABDOMEN and PELVIS FINDINGS  Gallbladder and uterus surgically absent.  BILATERAL nonobstructing renal calculi.  Liver, spleen, pancreas, kidneys, and adrenal glands otherwise unremarkable.  Normal appendix and ovaries.  Stomach decompressed, grossly unremarkable.  Scattered areas of mild nonspecific colonic wall thickening to rectum.  Questionable area of more prominent colonic wall thickening is seen at the splenic flexure, potentially artifact from incomplete distention or contraction but mass not excluded.  Unremarkable small bowel loops.  Bladder and ureters unremarkable.  No mass, adenopathy, free fluid, free air, inflammatory process or hernia.  Osseous structures unremarkable.  Review of the MIP images confirms the above findings.  IMPRESSION: No evidence of pulmonary embolism.  Tiny LEFT lung nodules, recommendation below.  Minimal infiltrate or scarring in the medial RIGHT middle lobe.  If the patient is at high risk for bronchogenic carcinoma, follow-up chest CT at 1 year is recommended. If the patient is at low risk, no follow-up is needed. This recommendation follows the consensus statement: Guidelines for Management of Small Pulmonary Nodules Detected on CT Scans: A Statement from the [place] as published in Radiology 2005; 237:395-400.  Multiple BILATERAL nonobstructing renal calculi.  Mild scattered wall thickening of the colon diffusely which may be related to history of collagenous colitis with an additional area of more prominent thickening at the splenic flexure for which mass cannot be excluded ; colonoscopy evaluation recommended to exclude splenic flexure tumor.   Electronically Signed   By: Lavonia Dana M.D.   On: 06/03/2014 07:46   US Abdomen Limited  06/02/2014   CLINICAL DATA:  Right upper quadrant pain, epigastric pain for 2 days  EXAM: US ABDOMEN LIMITED - RIGHT UPPER QUADRANT   COMPARISON:  None.  FINDINGS: Gallbladder:  Surgically absent  Common bile duct:  Diameter: 10 mm in diameter mild prominent in size probable postcholecystectomy  Liver:  No focal lesion identified. Mild intrahepatic biliary ductal dilatation probable post cholecystectomy.  IMPRESSION: 1. Surgically absent gallbladder. Mild intrahepatic and extrahepatic biliary ductal dilatation probable postcholecystectomy.   Electronically Signed   By: Lahoma Crocker M.D.   On: 06/02/2014 22:27   Dg Abd Acute W/chest  06/02/2014   CLINICAL DATA:  Abdominal pain, nausea, vomiting  EXAM: ACUTE ABDOMEN SERIES (ABDOMEN 2 VIEW & CHEST 1 VIEW)  COMPARISON:  03/15/2014  FINDINGS: No free abdominal air is noted. Normal small bowel gas pattern. Postcholecystectomy surgical clips are noted. No radiopaque calculi are noted. There is mild colonic gas in right colon and descending colon. Moderate colonic gas in transverse colon. Heart size and mediastinal contours are within normal limits. Both lungs are clear.  IMPRESSION: No acute disease within chest.  Normal small bowel gas pattern. Mild colonic gas is noted in right colon and descending colon. Moderate colonic gas in transverse colon. No free abdominal air.   Electronically Signed   By: Lahoma Crocker M.D.   On: 06/02/2014 19:19    Scheduled Meds: . enoxaparin (LOVENOX) injection  40 mg Subcutaneous QHS  . escitalopram  20 mg Oral q morning - 10a  . pantoprazole (PROTONIX) IV  40 mg Intravenous Q12H  . sodium chloride  1,000 mL Intravenous Once   Continuous Infusions: . sodium chloride 0.9 % 1,000 mL with potassium chloride 40 mEq infusion 125 mL/hr at 06/04/14 [place], Ineta Sinning  [facility] Pager [phone]. If 7PM-7AM, please contact night-coverage at [email or web address], password [facility] 06/04/2014, 10:02 AM  LOS: 2 days

## 2014-06-05 ENCOUNTER — Encounter (HOSPITAL_COMMUNITY): Payer: Self-pay | Admitting: Gastroenterology

## 2014-06-05 DIAGNOSIS — K263 Acute duodenal ulcer without hemorrhage or perforation: Secondary | ICD-10-CM

## 2014-06-05 DIAGNOSIS — K5289 Other specified noninfective gastroenteritis and colitis: Secondary | ICD-10-CM

## 2014-06-05 LAB — GLUCOSE, CAPILLARY
Glucose-Capillary: 83 mg/dL (ref 70–99)
Glucose-Capillary: 129 mg/dL — ABNORMAL HIGH (ref 70–99)

## 2014-06-05 MED ORDER — HYDROCODONE-ACETAMINOPHEN 5-325 MG PO TABS: 1.0000 | ORAL_TABLET | Freq: Four times a day (QID) | ORAL | Status: DC | PRN

## 2014-06-05 MED ORDER — BUDESONIDE 3 MG PO CP24: 9.0000 mg | ORAL_CAPSULE | Freq: Every day | ORAL | Status: DC

## 2014-06-05 MED ORDER — PANTOPRAZOLE SODIUM 40 MG PO TBEC: 40.0000 mg | DELAYED_RELEASE_TABLET | Freq: Two times a day (BID) | ORAL | Status: DC

## 2014-06-05 NOTE — Progress Notes (Signed)
[facility] PROGRESS NOTE  Assessment/Plan: Intractable Nausea with vomiting/  Epigastric pain: - Due to peptic ulcer disease - EGD on 06/04/2014: That showed a small hiatal hernia 2 small antral ulcer, status post biopsy - Continue PPI.  Hypokalemia: - Resolved.  History of anxiety: - Continue current regimen.    Code Status: full Family Communication: none  Disposition Plan: inpatient   Consultants:  GI  Procedures:  egd  Antibiotics:  None  HPI/Subjective: Improved epigastric pain.  Objective: Filed Vitals:   06/04/14 1330 06/04/14 1400 06/04/14 2150 06/05/14 0524  BP: 125/70 138/87 121/79 106/61  Pulse: 64 66 68 62  Temp:  98.3 F (36.8 C) 98.1 F (36.7 C) 97.8 F (36.6 C)  TempSrc:  Oral Oral Oral  Resp: 16 18 16 16   Height:      Weight:    58.832 kg (129 lb 11.2 oz)  SpO2: 100% 99% 99% 99%    Intake/Output Summary (Last 24 hours) at 06/05/14 0858 Last data filed at 06/05/14 0400  Gross per 24 hour  Intake   4130 ml  Output      0 ml  Net   4130 ml   Filed Weights   06/02/14 2352 06/05/14 0524  Weight: 56.745 kg (125 lb 1.6 oz) 58.832 kg (129 lb 11.2 oz)    Exam:  General: Alert, awake, oriented x3, in no acute distress.  HEENT: No bruits, no goiter.  Heart: Regular rate and rhythm. Lungs: Good air movement,  clear Abdomen: Soft, Epigastric nondistended, positive bowel sounds.  Neuro: Grossly intact, nonfocal.   Data Reviewed: Basic Metabolic Panel:  Recent Labs Lab 06/02/14 1621 06/03/14 0812 06/04/14 0820  NA 138 137 143  K 2.5* 3.2* 4.6  CL 103 106 120*  CO2 23 24 20   GLUCOSE 93 89 86  BUN 10 8 <5*  CREATININE 0.60 0.57 0.53  CALCIUM 9.2 7.9* 8.1*  MG  --  1.9  --    Liver Function Tests:  Recent Labs Lab 06/02/14 1621  AST 20  ALT 16  ALKPHOS 162*  BILITOT 0.6  PROT 7.5  ALBUMIN 4.4    Recent Labs Lab 06/02/14 1621 06/03/14 0812  LIPASE 22 42   No results for input(s): AMMONIA in the last  168 hours. CBC:  Recent Labs Lab 06/02/14 1621 06/03/14 0812 06/04/14 0820  WBC 9.4 9.7 5.2  NEUTROABS 5.7 6.5 2.5  HGB 13.4 11.4* 11.3*  HCT 39.2 33.4* 34.1*  MCV 103.4* 105.0* 107.2*  PLT 366 302 270   Cardiac Enzymes:  Recent Labs Lab 06/02/14 2319  TROPONINI <0.03   BNP (last 3 results)  Recent Labs  04/10/14 1025  BNP 51.4    ProBNP (last 3 results)  Recent Labs  03/10/14 1201  PROBNP 227.7*    CBG:  Recent Labs Lab 06/03/14 2353 06/04/14 0533 06/04/14 1725 06/04/14 2335 06/05/14 0523  GLUCAP 73 89 141* 85 83    Recent Results (from the past 240 hour(s))  Clostridium Difficile by PCR     Status: None   Collection Time: 06/03/14  1:08 AM  Result Value Ref Range Status   C difficile by pcr NEGATIVE NEGATIVE Final    Comment: Performed at [facility]  Stool culture     Status: None (Preliminary result)   Collection Time: 06/03/14  1:08 AM  Result Value Ref Range Status   Specimen Description STOOL  Final   Special Requests NONE  Final   Culture   Final  Culture reincubated for better growth Performed at [place] PENDING  Incomplete     Studies: No results found.  Scheduled Meds: . budesonide  9 mg Oral Daily  . enoxaparin (LOVENOX) injection  40 mg Subcutaneous QHS  . escitalopram  20 mg Oral q morning - 10a  . pantoprazole (PROTONIX) IV  40 mg Intravenous Q12H  . sodium chloride  1,000 mL Intravenous Once   Continuous Infusions: . sodium chloride 0.9 % 1,000 mL with potassium chloride 40 mEq infusion 125 mL/hr at 06/05/14 0030     Charlynne Cousins  [facility] Pager [phone]. If 7PM-7AM, please contact night-coverage at [email or web address], password [facility] 06/05/2014, 8:58 AM  LOS: 3 days

## 2014-06-05 NOTE — Discharge Summary (Addendum)
Physician Discharge Summary  Dawn Frost NAT:[id number] DOB: May 20, 1965 DOA: 06/02/2014  PCP: No PCP Per Patient  Admit date: 06/02/2014 Discharge date: 06/05/2014  Time spent: 30 minutes  Recommendations for Outpatient Follow-up:  1. Follow up with GI as an outpatient for Biopsy result in 2-4 weeks.  Discharge Diagnoses:  Principal Problem:   Peptic ulcer of antrum of stomach Active Problems:   Diarrhea   Collagenous colitis   Hypokalemia   Anxiety disorder   Epigastric pain   Nausea & vomiting   Discharge Condition: stable  Diet recommendation: regular  Filed Weights   06/02/14 2352 06/05/14 0524  Weight: 56.745 kg (125 lb 1.6 oz) 58.832 kg (129 lb 11.2 oz)    History of present illness:  49 y.o. female with history of microscopic colitis, WPW syndrome status post ablation, anxiety presents to the ER because of nausea vomiting and right upper quadrant pain. Patient also has dated which patient states is chronic. Patient had a tooth abscess for which patient was placed on Cipro 3 days ago for which patient started developing nausea and vomiting and abdominal pain. Abdominal pain mostly in the right upper quadrant. Cramping pain. Patient has watery diarrhea. In addition patient states she has been having retrosternal chest discomfort which has been constant with no shortness of breath. In the ER acute abdominal series and sonogram of the abdomen has been unremarkable. In addition patient is found to have severe hypokalemia with EKG showing U waves. Patient will be admitted for further management.    Hospital Course:  Intractable Nausea with vomiting/ Epigastric pain: - Start on a PPI IV twice a day on admission, EGD on 06/04/2014: That showed a small hiatal hernia 2 small antral ulcer, status post biopsy. - Will go home on PPI.  Hypokalemia: - Likely due to nausea vomiting repleted now resolved.  History of anxiety: - Continue current regimen.  Collagenous colitis: -  On admission started on Cipro Flagyl then DC'd, GI recommended to start budesonide we she will continue as an outpatient.  Procedures:  EGD on 06/04/2014 that showed hiatal hernia 2 small antral ulcers.  Consultations:  Gastroenterology  Discharge Exam: Filed Vitals:   06/05/14 0524  BP: 106/61  Pulse: 62  Temp: 97.8 F (36.6 C)  Resp: 16    General: See progress note  Discharge Instructions   Discharge Instructions    Diet - low sodium heart healthy    Complete by:  As directed      Increase activity slowly    Complete by:  As directed           Current Discharge Medication List    START taking these medications   Details  budesonide (ENTOCORT EC) 3 MG 24 hr capsule Take 3 capsules (9 mg total) by mouth daily. Qty: 30 capsule, Refills: 0    pantoprazole (PROTONIX) 40 MG tablet Take 1 tablet (40 mg total) by mouth 2 (two) times daily. Qty: 60 tablet, Refills: 3      CONTINUE these medications which have CHANGED   Details  HYDROcodone-acetaminophen (NORCO/VICODIN) 5-325 MG per tablet Take 1-2 tablets by mouth every 6 (six) hours as needed. Qty: 10 tablet, Refills: 0      CONTINUE these medications which have NOT CHANGED   Details  acetaminophen (TYLENOL) 325 MG tablet Take 650 mg by mouth every 6 (six) hours as needed for mild pain, fever or headache.     albuterol (PROVENTIL HFA;VENTOLIN HFA) 108 (90 BASE) MCG/ACT inhaler Inhale 1-2 puffs  into the lungs every 6 (six) hours as needed for wheezing. Qty: 1 Inhaler, Refills: 0    escitalopram (LEXAPRO) 20 MG tablet Take 20 mg by mouth every morning.    Associated Diagnoses: Postmenopausal bleeding    LORazepam (ATIVAN) 1 MG tablet Take 0.5-1 mg by mouth 2 (two) times daily as needed for anxiety (anxiety).     Menthol (HALLS COUGH DROPS MT) Use as directed 1 lozenge in the mouth or throat as needed (for cough).    Multiple Vitamin (MULITIVITAMIN WITH MINERALS) TABS Take 1 tablet by mouth every morning.      zolpidem (AMBIEN) 10 MG tablet Take 5 mg by mouth at bedtime as needed for sleep (sleep).     benzonatate (TESSALON) 100 MG capsule Take 1 capsule (100 mg total) by mouth every 8 (eight) hours. Qty: 21 capsule, Refills: 0    predniSONE (DELTASONE) 20 MG tablet Take 2 tablets (40 mg total) by mouth daily. Qty: 10 tablet, Refills: 0    promethazine (PHENERGAN) 25 MG tablet Take 1 tablet (25 mg total) by mouth every 6 (six) hours as needed for nausea or vomiting. Qty: 12 tablet, Refills: 0      STOP taking these medications     ciprofloxacin (CIPRO) 500 MG tablet      metroNIDAZOLE (FLAGYL) 500 MG tablet        Allergies  Allergen Reactions  . Prednisone Other (See Comments)    Hyperactivity and agitation  . Amoxicillin     Pt states she had a bad reaction to amox  . Butrans [Buprenorphine] Other (See Comments)    Burned skin  . Nsaids Other (See Comments)    Bad for colitis  . Zofran Other (See Comments)    Headache       The results of significant diagnostics from this hospitalization (including imaging, microbiology, ancillary and laboratory) are listed below for reference.    Significant Diagnostic Studies: Ct Angio Chest Pe W/cm &/or Wo Cm  06/03/2014   CLINICAL DATA:  RIGHT upper quadrant and epigastric pain with nausea and vomiting for 3-4 days increased yesterday, chest pain, smoker, history of collagenous colitis, absent disease, pancreatitis, Wolff-Parkinson-White syndrome, irritable bowel syndrome, kidney stones, multiple prior surgeries  EXAM: CT ANGIOGRAPHY CHEST  CT ABDOMEN AND PELVIS WITH CONTRAST  TECHNIQUE: Multidetector CT imaging of the chest was performed using the standard protocol during bolus administration of intravenous contrast. Multiplanar CT image reconstructions and MIPs were obtained to evaluate the vascular anatomy. Multidetector CT imaging of the abdomen and pelvis was performed using the standard protocol during bolus administration of  intravenous contrast. Sagittal and coronal MPR images reconstructed from axial data set.  CONTRAST:  133mL OMNIPAQUE IOHEXOL 350 MG/ML SOLN IV  COMPARISON:  04/10/2014 CTA chest abdomen pelvis  FINDINGS: CTA CHEST FINDINGS  Aorta normal caliber without aneurysm or dissection.  No thoracic adenopathy.  Pulmonary arteries well opacified and patent.  No evidence pulmonary embolism.  Azygos fissure noted.  Small focus of infiltrate or scarring in medial aspect of RIGHT middle lobe image 51.  Tiny nodular density minor fissure image 41.  Few tiny nonspecific LEFT lower lobe nodules.  No additional infiltrate, pleural effusion or pneumothorax.  Underlying emphysematous changes.  No acute osseous findings.  CT ABDOMEN and PELVIS FINDINGS  Gallbladder and uterus surgically absent.  BILATERAL nonobstructing renal calculi.  Liver, spleen, pancreas, kidneys, and adrenal glands otherwise unremarkable.  Normal appendix and ovaries.  Stomach decompressed, grossly unremarkable.  Scattered areas of mild  nonspecific colonic wall thickening to rectum.  Questionable area of more prominent colonic wall thickening is seen at the splenic flexure, potentially artifact from incomplete distention or contraction but mass not excluded.  Unremarkable small bowel loops.  Bladder and ureters unremarkable.  No mass, adenopathy, free fluid, free air, inflammatory process or hernia.  Osseous structures unremarkable.  Review of the MIP images confirms the above findings.  IMPRESSION: No evidence of pulmonary embolism.  Tiny LEFT lung nodules, recommendation below.  Minimal infiltrate or scarring in the medial RIGHT middle lobe.  If the patient is at high risk for bronchogenic carcinoma, follow-up chest CT at 1 year is recommended. If the patient is at low risk, no follow-up is needed. This recommendation follows the consensus statement: Guidelines for Management of Small Pulmonary Nodules Detected on CT Scans: A Statement from the [place]  as published in Radiology 2005; 237:395-400.  Multiple BILATERAL nonobstructing renal calculi.  Mild scattered wall thickening of the colon diffusely which may be related to history of collagenous colitis with an additional area of more prominent thickening at the splenic flexure for which mass cannot be excluded ; colonoscopy evaluation recommended to exclude splenic flexure tumor.   Electronically Signed   By: Lavonia Dana M.D.   On: 06/03/2014 07:46   Ct Abdomen Pelvis W Contrast  06/03/2014   CLINICAL DATA:  RIGHT upper quadrant and epigastric pain with nausea and vomiting for 3-4 days increased yesterday, chest pain, smoker, history of collagenous colitis, absent disease, pancreatitis, Wolff-Parkinson-White syndrome, irritable bowel syndrome, kidney stones, multiple prior surgeries  EXAM: CT ANGIOGRAPHY CHEST  CT ABDOMEN AND PELVIS WITH CONTRAST  TECHNIQUE: Multidetector CT imaging of the chest was performed using the standard protocol during bolus administration of intravenous contrast. Multiplanar CT image reconstructions and MIPs were obtained to evaluate the vascular anatomy. Multidetector CT imaging of the abdomen and pelvis was performed using the standard protocol during bolus administration of intravenous contrast. Sagittal and coronal MPR images reconstructed from axial data set.  CONTRAST:  176mL OMNIPAQUE IOHEXOL 350 MG/ML SOLN IV  COMPARISON:  04/10/2014 CTA chest abdomen pelvis  FINDINGS: CTA CHEST FINDINGS  Aorta normal caliber without aneurysm or dissection.  No thoracic adenopathy.  Pulmonary arteries well opacified and patent.  No evidence pulmonary embolism.  Azygos fissure noted.  Small focus of infiltrate or scarring in medial aspect of RIGHT middle lobe image 51.  Tiny nodular density minor fissure image 41.  Few tiny nonspecific LEFT lower lobe nodules.  No additional infiltrate, pleural effusion or pneumothorax.  Underlying emphysematous changes.  No acute osseous findings.  CT ABDOMEN  and PELVIS FINDINGS  Gallbladder and uterus surgically absent.  BILATERAL nonobstructing renal calculi.  Liver, spleen, pancreas, kidneys, and adrenal glands otherwise unremarkable.  Normal appendix and ovaries.  Stomach decompressed, grossly unremarkable.  Scattered areas of mild nonspecific colonic wall thickening to rectum.  Questionable area of more prominent colonic wall thickening is seen at the splenic flexure, potentially artifact from incomplete distention or contraction but mass not excluded.  Unremarkable small bowel loops.  Bladder and ureters unremarkable.  No mass, adenopathy, free fluid, free air, inflammatory process or hernia.  Osseous structures unremarkable.  Review of the MIP images confirms the above findings.  IMPRESSION: No evidence of pulmonary embolism.  Tiny LEFT lung nodules, recommendation below.  Minimal infiltrate or scarring in the medial RIGHT middle lobe.  If the patient is at high risk for bronchogenic carcinoma, follow-up chest CT at 1 year is recommended. If  the patient is at low risk, no follow-up is needed. This recommendation follows the consensus statement: Guidelines for Management of Small Pulmonary Nodules Detected on CT Scans: A Statement from the [place] as published in Radiology 2005; 237:395-400.  Multiple BILATERAL nonobstructing renal calculi.  Mild scattered wall thickening of the colon diffusely which may be related to history of collagenous colitis with an additional area of more prominent thickening at the splenic flexure for which mass cannot be excluded ; colonoscopy evaluation recommended to exclude splenic flexure tumor.   Electronically Signed   By: Lavonia Dana M.D.   On: 06/03/2014 07:46   US Abdomen Limited  06/02/2014   CLINICAL DATA:  Right upper quadrant pain, epigastric pain for 2 days  EXAM: US ABDOMEN LIMITED - RIGHT UPPER QUADRANT  COMPARISON:  None.  FINDINGS: Gallbladder:  Surgically absent  Common bile duct:  Diameter: 10 mm in  diameter mild prominent in size probable postcholecystectomy  Liver:  No focal lesion identified. Mild intrahepatic biliary ductal dilatation probable post cholecystectomy.  IMPRESSION: 1. Surgically absent gallbladder. Mild intrahepatic and extrahepatic biliary ductal dilatation probable postcholecystectomy.   Electronically Signed   By: Lahoma Crocker M.D.   On: 06/02/2014 22:27   Dg Abd Acute W/chest  06/02/2014   CLINICAL DATA:  Abdominal pain, nausea, vomiting  EXAM: ACUTE ABDOMEN SERIES (ABDOMEN 2 VIEW & CHEST 1 VIEW)  COMPARISON:  03/15/2014  FINDINGS: No free abdominal air is noted. Normal small bowel gas pattern. Postcholecystectomy surgical clips are noted. No radiopaque calculi are noted. There is mild colonic gas in right colon and descending colon. Moderate colonic gas in transverse colon. Heart size and mediastinal contours are within normal limits. Both lungs are clear.  IMPRESSION: No acute disease within chest. Normal small bowel gas pattern. Mild colonic gas is noted in right colon and descending colon. Moderate colonic gas in transverse colon. No free abdominal air.   Electronically Signed   By: Lahoma Crocker M.D.   On: 06/02/2014 19:19    Microbiology: Recent Results (from the past 240 hour(s))  Clostridium Difficile by PCR     Status: None   Collection Time: 06/03/14  1:08 AM  Result Value Ref Range Status   C difficile by pcr NEGATIVE NEGATIVE Final    Comment: Performed at [facility]  Stool culture     Status: None (Preliminary result)   Collection Time: 06/03/14  1:08 AM  Result Value Ref Range Status   Specimen Description STOOL  Final   Special Requests NONE  Final   Culture   Final    NO SUSPICIOUS COLONIES, CONTINUING TO HOLD Performed at [facility]    Report Status PENDING  Incomplete     Labs: Basic Metabolic Panel:  Recent Labs Lab 06/02/14 1621 06/03/14 0812 06/04/14 0820  NA 138 137 143  K 2.5* 3.2* 4.6  CL 103 106 120*  CO2 23 24 20    GLUCOSE 93 89 86  BUN 10 8 <5*  CREATININE 0.60 0.57 0.53  CALCIUM 9.2 7.9* 8.1*  MG  --  1.9  --    Liver Function Tests:  Recent Labs Lab 06/02/14 1621  AST 20  ALT 16  ALKPHOS 162*  BILITOT 0.6  PROT 7.5  ALBUMIN 4.4    Recent Labs Lab 06/02/14 1621 06/03/14 0812  LIPASE 22 42   No results for input(s): AMMONIA in the last 168 hours. CBC:  Recent Labs Lab 06/02/14 1621 06/03/14 4010 06/04/14 0820  WBC 9.4 9.7 5.2  NEUTROABS 5.7 6.5 2.5  HGB 13.4 11.4* 11.3*  HCT 39.2 33.4* 34.1*  MCV 103.4* 105.0* 107.2*  PLT 366 302 270   Cardiac Enzymes:  Recent Labs Lab 06/02/14 2319  TROPONINI <0.03   BNP: BNP (last 3 results)  Recent Labs  04/10/14 1025  BNP 51.4    ProBNP (last 3 results)  Recent Labs  03/10/14 1201  PROBNP 227.7*    CBG:  Recent Labs Lab 06/03/14 2353 06/04/14 0533 06/04/14 1725 06/04/14 2335 06/05/14 0523  GLUCAP 73 89 141* 85 83       Signed:  FELIZ ORTIZ, ABRAHAM  [facility] 06/05/2014, 9:27 AM

## 2014-06-06 LAB — GI PATHOGEN PANEL BY PCR, STOOL
C DIFFICILE TOXIN A/B: NOT DETECTED
CAMPYLOBACTER BY PCR: NOT DETECTED
Cryptosporidium by PCR: NOT DETECTED
E coli (ETEC) LT/ST: NOT DETECTED
E coli (STEC): NOT DETECTED
E coli 0157 by PCR: NOT DETECTED
G LAMBLIA BY PCR: NOT DETECTED
Norovirus GI/GII: NOT DETECTED
ROTAVIRUS A BY PCR: NOT DETECTED
SALMONELLA BY PCR: NOT DETECTED
Shigella by PCR: NOT DETECTED

## 2014-06-07 LAB — STOOL CULTURE

## 2014-06-15 ENCOUNTER — Encounter (HOSPITAL_BASED_OUTPATIENT_CLINIC_OR_DEPARTMENT_OTHER): Payer: Self-pay | Admitting: Emergency Medicine

## 2014-06-15 ENCOUNTER — Emergency Department (HOSPITAL_BASED_OUTPATIENT_CLINIC_OR_DEPARTMENT_OTHER)
Admission: EM | Admit: 2014-06-15 | Discharge: 2014-06-16 | Discharge status: Against Medical Advice | Payer: BLUE CROSS/BLUE SHIELD | Attending: Emergency Medicine | Admitting: Emergency Medicine

## 2014-06-15 DIAGNOSIS — R112 Nausea with vomiting, unspecified: Secondary | ICD-10-CM | POA: Diagnosis not present

## 2014-06-15 DIAGNOSIS — R21 Rash and other nonspecific skin eruption: Secondary | ICD-10-CM | POA: Insufficient documentation

## 2014-06-15 DIAGNOSIS — G8929 Other chronic pain: Secondary | ICD-10-CM | POA: Diagnosis not present

## 2014-06-15 DIAGNOSIS — Z720 Tobacco use: Secondary | ICD-10-CM | POA: Insufficient documentation

## 2014-06-15 DIAGNOSIS — R197 Diarrhea, unspecified: Secondary | ICD-10-CM | POA: Insufficient documentation

## 2014-06-15 DIAGNOSIS — R109 Unspecified abdominal pain: Secondary | ICD-10-CM | POA: Diagnosis not present

## 2014-06-15 DIAGNOSIS — R600 Localized edema: Secondary | ICD-10-CM | POA: Diagnosis not present

## 2014-06-15 NOTE — ED Notes (Signed)
Pt reports abd pain with N/V/D and rash and edema Left ankle

## 2014-07-04 ENCOUNTER — Telehealth: Payer: Self-pay

## 2014-07-04 NOTE — Telephone Encounter (Signed)
07/04/14 Disc received from [facility] and filed on shelf.Dawn Frost

## 2014-07-23 ENCOUNTER — Emergency Department (HOSPITAL_COMMUNITY): Payer: BLUE CROSS/BLUE SHIELD

## 2014-07-23 ENCOUNTER — Encounter (HOSPITAL_COMMUNITY): Payer: Self-pay | Admitting: *Deleted

## 2014-07-23 ENCOUNTER — Emergency Department (HOSPITAL_COMMUNITY)
Admission: EM | Admit: 2014-07-23 | Discharge: 2014-07-23 | Disposition: A | Discharge status: Home / Self Care | Payer: BLUE CROSS/BLUE SHIELD | Attending: Emergency Medicine | Admitting: Emergency Medicine

## 2014-07-23 DIAGNOSIS — F329 Major depressive disorder, single episode, unspecified: Secondary | ICD-10-CM | POA: Insufficient documentation

## 2014-07-23 DIAGNOSIS — G8929 Other chronic pain: Secondary | ICD-10-CM | POA: Diagnosis not present

## 2014-07-23 DIAGNOSIS — Z8679 Personal history of other diseases of the circulatory system: Secondary | ICD-10-CM | POA: Diagnosis not present

## 2014-07-23 DIAGNOSIS — Z862 Personal history of diseases of the blood and blood-forming organs and certain disorders involving the immune mechanism: Secondary | ICD-10-CM | POA: Diagnosis not present

## 2014-07-23 DIAGNOSIS — Z8619 Personal history of other infectious and parasitic diseases: Secondary | ICD-10-CM | POA: Diagnosis not present

## 2014-07-23 DIAGNOSIS — S300XXA Contusion of lower back and pelvis, initial encounter: Secondary | ICD-10-CM | POA: Insufficient documentation

## 2014-07-23 DIAGNOSIS — Z880 Allergy status to penicillin: Secondary | ICD-10-CM | POA: Diagnosis not present

## 2014-07-23 DIAGNOSIS — Y9289 Other specified places as the place of occurrence of the external cause: Secondary | ICD-10-CM | POA: Insufficient documentation

## 2014-07-23 DIAGNOSIS — Z8719 Personal history of other diseases of the digestive system: Secondary | ICD-10-CM | POA: Diagnosis not present

## 2014-07-23 DIAGNOSIS — Z7952 Long term (current) use of systemic steroids: Secondary | ICD-10-CM | POA: Insufficient documentation

## 2014-07-23 DIAGNOSIS — R112 Nausea with vomiting, unspecified: Secondary | ICD-10-CM | POA: Insufficient documentation

## 2014-07-23 DIAGNOSIS — E079 Disorder of thyroid, unspecified: Secondary | ICD-10-CM | POA: Diagnosis not present

## 2014-07-23 DIAGNOSIS — F419 Anxiety disorder, unspecified: Secondary | ICD-10-CM | POA: Diagnosis not present

## 2014-07-23 DIAGNOSIS — Z720 Tobacco use: Secondary | ICD-10-CM | POA: Diagnosis not present

## 2014-07-23 DIAGNOSIS — Y9389 Activity, other specified: Secondary | ICD-10-CM | POA: Insufficient documentation

## 2014-07-23 DIAGNOSIS — Y998 Other external cause status: Secondary | ICD-10-CM | POA: Diagnosis not present

## 2014-07-23 DIAGNOSIS — Z87442 Personal history of urinary calculi: Secondary | ICD-10-CM | POA: Insufficient documentation

## 2014-07-23 DIAGNOSIS — S3992XA Unspecified injury of lower back, initial encounter: Secondary | ICD-10-CM | POA: Diagnosis present

## 2014-07-23 DIAGNOSIS — M549 Dorsalgia, unspecified: Secondary | ICD-10-CM

## 2014-07-23 DIAGNOSIS — Z79899 Other long term (current) drug therapy: Secondary | ICD-10-CM | POA: Diagnosis not present

## 2014-07-23 MED ORDER — PROMETHAZINE HCL 25 MG PO TABS
12.5000 mg | ORAL_TABLET | Freq: Once | ORAL | Status: AC
  Administered 2014-07-23: 12.5 mg via ORAL
  Filled 2014-07-23: qty 1

## 2014-07-23 MED ORDER — HYDROCODONE-ACETAMINOPHEN 5-325 MG PO TABS
1.0000 | ORAL_TABLET | Freq: Once | ORAL | Status: AC
  Administered 2014-07-23: 1 via ORAL
  Filled 2014-07-23: qty 1

## 2014-07-23 MED ORDER — METHOCARBAMOL 500 MG PO TABS: 1000.0000 mg | ORAL_TABLET | Freq: Four times a day (QID) | ORAL | Status: DC

## 2014-07-23 MED ORDER — METHOCARBAMOL 500 MG PO TABS
1000.0000 mg | ORAL_TABLET | Freq: Once | ORAL | Status: AC
  Administered 2014-07-23: 1000 mg via ORAL
  Filled 2014-07-23: qty 2

## 2014-07-23 NOTE — ED Provider Notes (Signed)
CSN: [id number]     Arrival date & time 07/23/14  1718 History  This chart was scribed for Dawn Frost , PA-C, working with Dawn Rank, MD by Dawn Frost, ED Scribe. The patient was seen in room WTR5/WTR5 at 6:09 PM.     Chief Complaint  Patient presents with  . Back Pain   The history is provided by the patient. No language interpreter was used.   HPI Comments: Dawn Frost is a 49 y.o. female with a medical hx of WPW, thyroid disease, chronic back pain and IBS who presents to the Emergency Department complaining of low back pain onset last night at 7 PM. Pt was thrown from her horse last night onto the road. Pt thinks that she landed on her back more towards her left side. Pt didn't have her helmet on. Pt is not able to stand up without difficulty because of the pain. Pt had this pain last night but it has since been progressing. She states that she is having associated symptoms of n/v, left buttock pain. She states that she has tried heating pad, warm soaks, tylenol with no relief for her symptoms. She denies numbness, tingling, LOC, hitting her head. Patient denies warning symptoms of back pain including: fecal incontinence, urinary retention or overflow incontinence, night sweats, waking from sleep with back pain, unexplained fevers or weight loss, h/o cancer, IVDU, recent trauma.       Past Medical History  Diagnosis Date  . Collagenous colitis   . WPW (Wolff-Parkinson-White syndrome)   . Addison disease   . Thyroid disease   . Clostridium difficile diarrhea   . Anxiety and depression   . Chronic diarrhea   . Chronic back pain   . Drug-seeking behavior   . Irritable bowel syndrome (IBS)   . Anemia   . Pancreatitis   . Kidney stone   . Kidney stone   . Depression   . Anxiety   . Vaginal delivery 4818,5631, 1998   Past Surgical History  Procedure Laterality Date  . Cholecystectomy    . Tubal ligation    . Lithotripsy    . Sphincterotomy    . Cardiac electrophysiology  mapping and ablation      for WPW  . Video bronchoscopy  08/31/2011    Procedure: VIDEO BRONCHOSCOPY WITH FLUORO;  Surgeon: Elsie Stain, MD;  Location: [facility];  Service: Cardiopulmonary;  Laterality: N/A;  . Vaginal hysterectomy  10/28/2013    Procedure: HYSTERECTOMY VAGINAL;  Surgeon: Emily Filbert, MD;  Location: [facility];  Service: Gynecology;;  . Salpingoophorectomy Bilateral 10/28/2013    Procedure: SALPINGO OOPHORECTOMY;  Surgeon: Emily Filbert, MD;  Location: [facility];  Service: Gynecology;  Laterality: Bilateral;  . Abdominal hysterectomy  10/28/2013  . Esophagogastroduodenoscopy (egd) with propofol N/A 06/04/2014    Procedure: ESOPHAGOGASTRODUODENOSCOPY (EGD) WITH PROPOFOL;  Surgeon: Jeryl Columbia, MD;  Location: [facility];  Service: Endoscopy;  Laterality: N/A;   Family History  Problem Relation Age of Onset  . Prostate cancer Father   . Coronary artery disease Maternal Grandfather    History  Substance Use Topics  . Smoking status: Current Every Day Smoker -- 0.25 packs/day for 30 years    Types: Cigarettes  . Smokeless tobacco: Never Used  . Alcohol Use: Yes     Comment: seldom   OB History    Gravida Para Term Preterm AB TAB SAB Ectopic Multiple Living   3 3 3        3  Review of Systems  Constitutional: Negative for fever, fatigue and unexpected weight change.  HENT: Negative for tinnitus.   Eyes: Negative for photophobia, pain and visual disturbance.  Respiratory: Negative for shortness of breath.   Cardiovascular: Negative for chest pain.  Gastrointestinal: Positive for nausea and vomiting. Negative for abdominal pain and constipation.       No bowel incontinence  Genitourinary: Negative for dysuria, hematuria, flank pain, vaginal bleeding, vaginal discharge and pelvic pain.       No bladder incontinence   Musculoskeletal: Positive for myalgias (left buttock pain) and back pain. Negative for gait problem and neck pain.  Skin: Negative for wound.   Neurological: Negative for dizziness, syncope, weakness, light-headedness, numbness and headaches.       Denies saddle paresthesias.  Psychiatric/Behavioral: Negative for confusion and decreased concentration.    Allergies  Prednisone; Amoxicillin; Butrans; Nsaids; and Zofran  Home Medications   Prior to Admission medications   Medication Sig Start Date End Date Taking? Authorizing Provider  acetaminophen (TYLENOL) 325 MG tablet Take 650 mg by mouth every 6 (six) hours as needed for mild pain or moderate pain (back pain).    Yes Historical Provider, MD  escitalopram (LEXAPRO) 20 MG tablet Take 20 mg by mouth every morning.    Yes Historical Provider, MD  LORazepam (ATIVAN) 1 MG tablet Take 0.5 mg by mouth 2 (two) times daily.    Yes Historical Provider, MD  Multiple Vitamin (MULITIVITAMIN WITH MINERALS) TABS Take 1 tablet by mouth every morning.    Yes Historical Provider, MD  pantoprazole (PROTONIX) 40 MG tablet Take 1 tablet (40 mg total) by mouth 2 (two) times daily. 06/05/14  Yes Charlynne Cousins, MD  promethazine (PHENERGAN) 25 MG tablet Take 1 tablet (25 mg total) by mouth every 6 (six) hours as needed for nausea or vomiting. 03/10/14  Yes Larene Pickett, PA-C  albuterol (PROVENTIL HFA;VENTOLIN HFA) 108 (90 BASE) MCG/ACT inhaler Inhale 1-2 puffs into the lungs every 6 (six) hours as needed for wheezing. Patient not taking: Reported on 07/23/2014 03/10/14   Larene Pickett, PA-C  benzonatate (TESSALON) 100 MG capsule Take 1 capsule (100 mg total) by mouth every 8 (eight) hours. Patient not taking: Reported on 04/10/2014 03/10/14   Larene Pickett, PA-C  budesonide (ENTOCORT EC) 3 MG 24 hr capsule Take 3 capsules (9 mg total) by mouth daily. Patient not taking: Reported on 07/23/2014 06/05/14   Charlynne Cousins, MD  HYDROcodone-acetaminophen (NORCO/VICODIN) 5-325 MG per tablet Take 1-2 tablets by mouth every 6 (six) hours as needed. Patient not taking: Reported on 07/23/2014 06/05/14   Charlynne Cousins, MD  predniSONE (DELTASONE) 20 MG tablet Take 2 tablets (40 mg total) by mouth daily. Patient not taking: Reported on 04/10/2014 03/15/14   Glendell Docker, NP   BP 130/89 mmHg  Pulse 82  Temp(Src) 98 F (36.7 C) (Oral)  Resp 18  SpO2 98%  LMP 08/06/2013  Physical Exam  Constitutional: She is oriented to person, place, and time. She appears well-developed and well-nourished.  HENT:  Head: Normocephalic and atraumatic. Head is without raccoon's eyes and without Battle's sign.  Right Ear: Tympanic membrane, external ear and ear canal normal. No hemotympanum.  Left Ear: Tympanic membrane, external ear and ear canal normal. No hemotympanum.  Nose: Nose normal. No nasal septal hematoma.  Mouth/Throat: Uvula is midline, oropharynx is clear and moist and mucous membranes are normal.  Eyes: Conjunctivae, EOM and lids are normal. Pupils are equal, round,  and reactive to light. Right eye exhibits no discharge. Left eye exhibits no discharge. Right eye exhibits no nystagmus. Left eye exhibits no nystagmus.  No visible hyphema noted  Neck: Normal range of motion. Neck supple. No tracheal deviation present.  Cardiovascular: Normal rate and regular rhythm.   Pulmonary/Chest: Effort normal and breath sounds normal. No respiratory distress.  Abdominal: Soft. She exhibits no distension. There is no tenderness. There is no CVA tenderness.  Musculoskeletal: Normal range of motion.       Cervical back: She exhibits normal range of motion, no tenderness and no bony tenderness.       Thoracic back: She exhibits no tenderness and no bony tenderness.       Lumbar back: She exhibits tenderness and bony tenderness. She exhibits normal range of motion and no swelling.       Back:  No step-off noted with palpation of spine.   Neurological: She is alert and oriented to person, place, and time. She has normal strength and normal reflexes. No cranial nerve deficit or sensory deficit. Coordination  normal. GCS eye subscore is 4. GCS verbal subscore is 5. GCS motor subscore is 6.  5/5 strength in entire lower extremities bilaterally. No sensation deficit. Pt ambulates but takes small steps.   Skin: Skin is warm and dry. No rash noted.  Psychiatric: She has a normal mood and affect. Her behavior is normal.  Nursing note and vitals reviewed.   ED Course  Procedures   DIAGNOSTIC STUDIES: Oxygen Saturation is 98% on RA, normal by my interpretation.    COORDINATION OF CARE: 6:12 PM-Discussed treatment plan which includes Robaxin, Norco, L-spine X-ray, X-ray of sacrum/coccyx, with pt at bedside and pt agreed to plan.    Labs Review Labs Reviewed - No data to display  Imaging Review Dg Lumbar Spine Complete  07/23/2014   CLINICAL DATA:  49 year old female new with left buttock and coccyx pain after being thrown from a horse 1 day previously  EXAM: LUMBAR SPINE - COMPLETE 4+ VIEW  COMPARISON:  Concurrently obtained radiographs of the sacrum and coccyx; prior CT abdomen and pelvis 04/10/2014  FINDINGS: There is no evidence of lumbar spine fracture. Alignment is normal. Intervertebral disc spaces are maintained.  IMPRESSION: Negative.   Electronically Signed   By: Jacqulynn Cadet M.D.   On: 07/23/2014 19:29   Dg Sacrum/coccyx  07/23/2014   CLINICAL DATA:  Back pain following being thrown from a horse 1 day ago, initial encounter  EXAM: SACRUM AND COCCYX - 2+ VIEW  COMPARISON:  04/10/2014  FINDINGS: The pelvic ring is intact. The sacrum appears intact. Anterior orientation of the coccyx is noted but stable from a previous exam.  IMPRESSION: No acute abnormality is noted.   Electronically Signed   By: Inez Catalina M.D.   On: 07/23/2014 19:31     EKG Interpretation None       8:10 PM X-rays neg. Pt informed.   No red flag s/s of low back pain. Patient was counseled on back pain precautions and told to do activity as tolerated but do not lift, push, or pull heavy objects more than 10  Frost for the next week.  Patient counseled to use ice or heat on back for no longer than 15 minutes every hour.   Patient prescribed muscle relaxer and counseled on proper use of muscle relaxant medication.    Urged patient not to drink alcohol, drive, or perform any other activities that requires focus while taking this  medication.  Patient urged to follow-up with PCP if pain does not improve with treatment and rest or if pain becomes recurrent. Urged to return with worsening severe pain, loss of bowel or bladder control, trouble walking.   The patient verbalizes understanding and agrees with the plan.   MDM   Final diagnoses:  Back pain  Contusion of lower back, initial encounter   Contusion after alleged fall. X-rays neg. Pain treated in ED. X-rays neg. No neuro deficits. Pain likely 2/2 contusion and muscle spasm. Tx: tylenol, robaxin. No indication for narcotic pain medications. No indications for advanced imaging. Doubt serious head injury, now > 24 hrs since accident occurred.    I personally performed the services described in this documentation, which was scribed in my presence. The recorded information has been reviewed and is accurate.      Carlisle Cater, PA-C 07/23/14 2012  Dawn Rank, MD 07/24/14 [phone]

## 2014-07-23 NOTE — ED Notes (Signed)
Pt ambulatory to triage room,  reports she was thrown from her horse last night, c/o lower back pain and left buttocks pain.

## 2014-07-23 NOTE — Discharge Instructions (Signed)
Please read and follow all provided instructions.  Your diagnoses today include:  1. Contusion of lower back, initial encounter   2. Back pain    Tests performed today include:  Vital signs - see below for your results today  X-rays of your lower back - no broken bones  Medications prescribed:   Robaxin (methocarbamol) - muscle relaxer medication  DO NOT drive or perform any activities that require you to be awake and alert because this medicine can make you drowsy.   Take any prescribed medications only as directed.  Home care instructions:   Follow any educational materials contained in this packet  Please rest, use ice or heat on your back for the next several days  Do not lift, push, pull anything more than 10 pounds for the next week  Follow-up instructions: Please follow-up with your primary care provider in the next 1 week for further evaluation of your symptoms.   Return instructions:  SEEK IMMEDIATE MEDICAL ATTENTION IF YOU HAVE:  New numbness, tingling, weakness, or problem with the use of your arms or legs  Severe back pain not relieved with medications  Loss control of your bowels or bladder  Increasing pain in any areas of the body (such as chest or abdominal pain)  Shortness of breath, dizziness, or fainting.   Worsening nausea (feeling sick to your stomach), vomiting, fever, or sweats  Any other emergent concerns regarding your health   Additional Information:  Your vital signs today were: BP 130/89 mmHg   Pulse 82   Temp(Src) 98 F (36.7 C) (Oral)   Resp 18   SpO2 98%   LMP 08/06/2013 If your blood pressure (BP) was elevated above 135/85 this visit, please have this repeated by your doctor within one month. --------------

## 2014-07-23 NOTE — ED Notes (Signed)
Pt states that she will need phenergan because she has been taking tylenol at home and it makes her nauseated and vomit. PA notified of the same

## 2014-07-23 NOTE — ED Notes (Signed)
Patient refused w/c, ambulatory upon d/c in NAD.

## 2014-07-23 NOTE — ED Notes (Signed)
Transported to xray 

## 2014-07-29 ENCOUNTER — Encounter (HOSPITAL_BASED_OUTPATIENT_CLINIC_OR_DEPARTMENT_OTHER): Payer: Self-pay | Admitting: *Deleted

## 2014-07-29 ENCOUNTER — Inpatient Hospital Stay (HOSPITAL_BASED_OUTPATIENT_CLINIC_OR_DEPARTMENT_OTHER)
Admission: EM | Admit: 2014-07-29 | Discharge: 2014-07-31 | DRG: 392 | Disposition: A | Discharge status: Home / Self Care | Payer: BLUE CROSS/BLUE SHIELD | Attending: Internal Medicine | Admitting: Internal Medicine

## 2014-07-29 DIAGNOSIS — E039 Hypothyroidism, unspecified: Secondary | ICD-10-CM | POA: Diagnosis present

## 2014-07-29 DIAGNOSIS — R197 Diarrhea, unspecified: Secondary | ICD-10-CM

## 2014-07-29 DIAGNOSIS — M549 Dorsalgia, unspecified: Secondary | ICD-10-CM | POA: Diagnosis present

## 2014-07-29 DIAGNOSIS — Z79899 Other long term (current) drug therapy: Secondary | ICD-10-CM

## 2014-07-29 DIAGNOSIS — F419 Anxiety disorder, unspecified: Secondary | ICD-10-CM | POA: Diagnosis present

## 2014-07-29 DIAGNOSIS — F17210 Nicotine dependence, cigarettes, uncomplicated: Secondary | ICD-10-CM | POA: Diagnosis present

## 2014-07-29 DIAGNOSIS — K5289 Other specified noninfective gastroenteritis and colitis: Principal | ICD-10-CM

## 2014-07-29 DIAGNOSIS — R112 Nausea with vomiting, unspecified: Secondary | ICD-10-CM | POA: Diagnosis present

## 2014-07-29 DIAGNOSIS — I456 Pre-excitation syndrome: Secondary | ICD-10-CM | POA: Diagnosis present

## 2014-07-29 DIAGNOSIS — D7589 Other specified diseases of blood and blood-forming organs: Secondary | ICD-10-CM | POA: Diagnosis present

## 2014-07-29 DIAGNOSIS — N950 Postmenopausal bleeding: Secondary | ICD-10-CM | POA: Diagnosis present

## 2014-07-29 DIAGNOSIS — E271 Primary adrenocortical insufficiency: Secondary | ICD-10-CM | POA: Diagnosis present

## 2014-07-29 DIAGNOSIS — Z885 Allergy status to narcotic agent status: Secondary | ICD-10-CM

## 2014-07-29 DIAGNOSIS — Z880 Allergy status to penicillin: Secondary | ICD-10-CM

## 2014-07-29 DIAGNOSIS — Z87442 Personal history of urinary calculi: Secondary | ICD-10-CM | POA: Diagnosis not present

## 2014-07-29 DIAGNOSIS — F329 Major depressive disorder, single episode, unspecified: Secondary | ICD-10-CM | POA: Diagnosis present

## 2014-07-29 DIAGNOSIS — Z888 Allergy status to other drugs, medicaments and biological substances status: Secondary | ICD-10-CM | POA: Diagnosis not present

## 2014-07-29 DIAGNOSIS — G8929 Other chronic pain: Secondary | ICD-10-CM | POA: Diagnosis present

## 2014-07-29 DIAGNOSIS — K52831 Collagenous colitis: Secondary | ICD-10-CM | POA: Diagnosis present

## 2014-07-29 DIAGNOSIS — E876 Hypokalemia: Secondary | ICD-10-CM | POA: Diagnosis present

## 2014-07-29 DIAGNOSIS — Z8249 Family history of ischemic heart disease and other diseases of the circulatory system: Secondary | ICD-10-CM

## 2014-07-29 DIAGNOSIS — E079 Disorder of thyroid, unspecified: Secondary | ICD-10-CM | POA: Diagnosis present

## 2014-07-29 DIAGNOSIS — G43A1 Cyclical vomiting, intractable: Secondary | ICD-10-CM | POA: Diagnosis not present

## 2014-07-29 LAB — BASIC METABOLIC PANEL
Anion gap: 11 (ref 5–15)
BUN: 11 mg/dL (ref 6–23)
CALCIUM: 8.6 mg/dL (ref 8.4–10.5)
CO2: 22 mmol/L (ref 19–32)
Chloride: 107 mmol/L (ref 96–112)
Creatinine, Ser: 0.72 mg/dL (ref 0.50–1.10)
GFR calc Af Amer: >90 mL/min (ref >90)
GFR calc non Af Amer: >90 mL/min (ref >90)
Glucose, Bld: 94 mg/dL (ref 70–99)
Potassium: 2.2 mmol/L — CL (ref 3.5–5.1)
Sodium: 140 mmol/L (ref 135–145)

## 2014-07-29 LAB — CBC WITH DIFFERENTIAL/PLATELET
BASOS ABS: 0.1 10*3/uL (ref 0.0–0.1)
Basophils Relative: 1 % (ref 0–1)
EOS ABS: 0.4 10*3/uL (ref 0.0–0.7)
Eosinophils Relative: 5 % (ref 0–5)
HCT: 34.9 % — ABNORMAL LOW (ref 36.0–46.0)
HEMOGLOBIN: 12.2 g/dL (ref 12.0–15.0)
Lymphocytes Relative: 34 % (ref 12–46)
Lymphs Abs: 2.8 10*3/uL (ref 0.7–4.0)
MCH: 35.8 pg — ABNORMAL HIGH (ref 26.0–34.0)
MCHC: 35 g/dL (ref 30.0–36.0)
MCV: 102.3 fL — AB (ref 78.0–100.0)
Monocytes Absolute: 0.6 10*3/uL (ref 0.1–1.0)
Monocytes Relative: 7 % (ref 3–12)
Neutro Abs: 4.4 10*3/uL (ref 1.7–7.7)
Neutrophils Relative %: 53 % (ref 43–77)
PLATELETS: 238 10*3/uL (ref 150–400)
RBC: 3.41 MIL/uL — ABNORMAL LOW (ref 3.87–5.11)
RDW: 13.1 % (ref 11.5–15.5)
WBC: 8.2 10*3/uL (ref 4.0–10.5)

## 2014-07-29 LAB — MAGNESIUM: MAGNESIUM: 1.9 mg/dL (ref 1.5–2.5)

## 2014-07-29 LAB — LIPASE, BLOOD: Lipase: 16 U/L (ref 11–59)

## 2014-07-29 LAB — HEPATIC FUNCTION PANEL
ALK PHOS: 121 U/L — AB (ref 39–117)
ALT: 21 U/L (ref 0–35)
AST: 25 U/L (ref 0–37)
Albumin: 4.2 g/dL (ref 3.5–5.2)
Bilirubin, Direct: 0.3 mg/dL (ref 0.0–0.5)
Indirect Bilirubin: 0.5 mg/dL (ref 0.3–0.9)
Total Bilirubin: 0.8 mg/dL (ref 0.3–1.2)
Total Protein: 6.6 g/dL (ref 6.0–8.3)

## 2014-07-29 MED ORDER — PROMETHAZINE HCL 25 MG PO TABS
12.5000 mg | ORAL_TABLET | Freq: Four times a day (QID) | ORAL | Status: DC | PRN
  Administered 2014-07-30: 12.5 mg via ORAL
  Filled 2014-07-29: qty 1

## 2014-07-29 MED ORDER — FOLIC ACID 1 MG PO TABS
1.0000 mg | ORAL_TABLET | Freq: Every day | ORAL | Status: DC
  Administered 2014-07-30 – 2014-07-31 (×3): 1 mg via ORAL
  Filled 2014-07-29 (×3): qty 1

## 2014-07-29 MED ORDER — HYDROMORPHONE HCL 1 MG/ML IJ SOLN
0.5000 mg | INTRAMUSCULAR | Status: DC | PRN
  Administered 2014-07-30 – 2014-07-31 (×9): 0.5 mg via INTRAVENOUS
  Filled 2014-07-29 (×9): qty 1

## 2014-07-29 MED ORDER — ACETAMINOPHEN 325 MG PO TABS: 650.0000 mg | ORAL_TABLET | Freq: Four times a day (QID) | ORAL | Status: DC | PRN

## 2014-07-29 MED ORDER — FENTANYL CITRATE (PF) 100 MCG/2ML IJ SOLN
50.0000 ug | Freq: Once | INTRAMUSCULAR | Status: AC
  Administered 2014-07-29: 50 ug via INTRAVENOUS
  Filled 2014-07-29: qty 2

## 2014-07-29 MED ORDER — SODIUM CHLORIDE 0.9 % IV SOLN
INTRAVENOUS | Status: DC
  Administered 2014-07-29: via INTRAVENOUS

## 2014-07-29 MED ORDER — LORAZEPAM 0.5 MG PO TABS
0.5000 mg | ORAL_TABLET | Freq: Two times a day (BID) | ORAL | Status: DC
  Administered 2014-07-30 – 2014-07-31 (×4): 0.5 mg via ORAL
  Filled 2014-07-29 (×4): qty 1

## 2014-07-29 MED ORDER — SODIUM CHLORIDE 0.9 % IV BOLUS (SEPSIS)
500.0000 mL | Freq: Once | INTRAVENOUS | Status: AC
  Administered 2014-07-29: 500 mL via INTRAVENOUS

## 2014-07-29 MED ORDER — ACETAMINOPHEN 650 MG RE SUPP: 650.0000 mg | Freq: Four times a day (QID) | RECTAL | Status: DC | PRN

## 2014-07-29 MED ORDER — SODIUM CHLORIDE 0.9 % IJ SOLN: 3.0000 mL | Freq: Two times a day (BID) | INTRAMUSCULAR | Status: DC

## 2014-07-29 MED ORDER — POTASSIUM CHLORIDE 10 MEQ/100ML IV SOLN
10.0000 meq | Freq: Once | INTRAVENOUS | Status: AC
  Administered 2014-07-29: 10 meq via INTRAVENOUS
  Filled 2014-07-29: qty 100

## 2014-07-29 MED ORDER — ESCITALOPRAM OXALATE 20 MG PO TABS
20.0000 mg | ORAL_TABLET | Freq: Every morning | ORAL | Status: DC
  Administered 2014-07-30 – 2014-07-31 (×2): 20 mg via ORAL
  Filled 2014-07-29 (×2): qty 1

## 2014-07-29 MED ORDER — POTASSIUM CHLORIDE 10 MEQ/100ML IV SOLN
10.0000 meq | INTRAVENOUS | Status: AC
  Administered 2014-07-30 (×4): 10 meq via INTRAVENOUS
  Filled 2014-07-29 (×4): qty 100

## 2014-07-29 MED ORDER — VITAMIN B-1 100 MG PO TABS
100.0000 mg | ORAL_TABLET | Freq: Every day | ORAL | Status: DC
  Administered 2014-07-30 – 2014-07-31 (×3): 100 mg via ORAL
  Filled 2014-07-29 (×3): qty 1

## 2014-07-29 MED ORDER — ZOLPIDEM TARTRATE 5 MG PO TABS
5.0000 mg | ORAL_TABLET | Freq: Every evening | ORAL | Status: DC | PRN
  Administered 2014-07-30 (×2): 5 mg via ORAL
  Filled 2014-07-29 (×2): qty 1

## 2014-07-29 MED ORDER — PROMETHAZINE HCL 25 MG/ML IJ SOLN
12.5000 mg | Freq: Four times a day (QID) | INTRAMUSCULAR | Status: DC | PRN
  Administered 2014-07-30 – 2014-07-31 (×5): 12.5 mg via INTRAVENOUS
  Filled 2014-07-29 (×6): qty 1

## 2014-07-29 MED ORDER — PROMETHAZINE HCL 25 MG/ML IJ SOLN
25.0000 mg | Freq: Once | INTRAMUSCULAR | Status: AC
  Administered 2014-07-29: 25 mg via INTRAVENOUS
  Filled 2014-07-29: qty 1

## 2014-07-29 MED ORDER — ADULT MULTIVITAMIN W/MINERALS CH
1.0000 | ORAL_TABLET | Freq: Every day | ORAL | Status: DC
  Administered 2014-07-30 – 2014-07-31 (×3): 1 via ORAL
  Filled 2014-07-29 (×3): qty 1

## 2014-07-29 NOTE — H&P (Signed)
[facility] History and Physical  Dawn Frost OZD:[id number] DOB: 12-11-1965 DOA: 07/29/2014  Referring physician: Irene Pap PCP: No PCP Per Patient   Chief Complaint: Nausea and Vomiting  HPI: Dawn Frost is a 49 y.o. female presents with nausea and vomiting. Patient has a history of collagenous colitis and anxiety disorder with prior admissions for intractable vomiting who presents with similar complaints. She states that ina ddition she has been having abdominal pain. She states this started on Sunday She states that she went to her PCP todayu had labs drawn and was found to have severe hypokalemia. She has no headache but admits to dizziness. She has no chest pain noted. She has no swelling of her legs. She has had some diarrhea with no blood in the stools. She states that she has had no fevers noted. She states that she has a history of collagenous colitis which she has not seen a GI for in a while. She was seen a long time ago in [facility] but has not followed up with anyone.   Review of Systems:  Constitutional:  +weight loss, no Fevers, chills, fatigue.  HEENT:  No headaches, No sneezing, itching, ear ache, nasal congestion, post nasal drip,  Cardio-vascular:  No chest pain, Orthopnea, PND, swelling in lower extremities, anasarca, +dizziness  GI:  No heartburn, indigestion, +abdominal pain, +nausea, +vomiting, +diarrhea  Resp:  No shortness of breath with exertion or at rest. No excess mucus, no productive cough, No non-productive cough, No coughing up of blood  Skin:  no rash or lesions.  GU:  no dysuria, change in color of urine, no urgency or frequency. No flank pain.  Musculoskeletal:  No joint pain or swelling. No decreased range of motion. No back pain.  Psych:  No change in mood or affect. +anxiety. No memory loss.   Past Medical History  Diagnosis Date  . Collagenous colitis   . WPW (Wolff-Parkinson-White syndrome)   . Addison disease   .  Thyroid disease   . Clostridium difficile diarrhea   . Anxiety and depression   . Chronic diarrhea   . Chronic back pain   . Drug-seeking behavior   . Irritable bowel syndrome (IBS)   . Anemia   . Pancreatitis   . Kidney stone   . Kidney stone   . Depression   . Anxiety   . Vaginal delivery [id number], 1998   Past Surgical History  Procedure Laterality Date  . Cholecystectomy    . Tubal ligation    . Lithotripsy    . Sphincterotomy    . Cardiac electrophysiology mapping and ablation      for WPW  . Video bronchoscopy  08/31/2011    Procedure: VIDEO BRONCHOSCOPY WITH FLUORO;  Surgeon: Elsie Stain, MD;  Location: [facility];  Service: Cardiopulmonary;  Laterality: N/A;  . Vaginal hysterectomy  10/28/2013    Procedure: HYSTERECTOMY VAGINAL;  Surgeon: Emily Filbert, MD;  Location: [facility];  Service: Gynecology;;  . Salpingoophorectomy Bilateral 10/28/2013    Procedure: SALPINGO OOPHORECTOMY;  Surgeon: Emily Filbert, MD;  Location: [facility];  Service: Gynecology;  Laterality: Bilateral;  . Abdominal hysterectomy  10/28/2013  . Esophagogastroduodenoscopy (egd) with propofol N/A 06/04/2014    Procedure: ESOPHAGOGASTRODUODENOSCOPY (EGD) WITH PROPOFOL;  Surgeon: Jeryl Columbia, MD;  Location: [facility];  Service: Endoscopy;  Laterality: N/A;   Social History:  reports that she has been smoking Cigarettes.  She has a 7.5 pack-year smoking history. She has never used smokeless tobacco.  She reports that she drinks alcohol. She reports that she uses illicit drugs (Barbituates).  Allergies  Allergen Reactions  . Prednisone Other (See Comments)    Hyperactivity and agitation  . Amoxicillin     Pt states she had a bad reaction to amox  . Butrans [Buprenorphine] Other (See Comments)    Burned skin  . Nsaids Other (See Comments)    Bad for colitis  . Zofran Other (See Comments)    Headache     Family History  Problem Relation Age of Onset  . Prostate cancer Father   . Coronary artery  disease Maternal Grandfather     Prior to Admission medications   Medication Sig Start Date End Date Taking? Authorizing Provider  acetaminophen (TYLENOL) 325 MG tablet Take 650 mg by mouth every 6 (six) hours as needed for mild pain or moderate pain (back pain).     Historical Provider, MD  escitalopram (LEXAPRO) 20 MG tablet Take 20 mg by mouth every morning.     Historical Provider, MD  LORazepam (ATIVAN) 1 MG tablet Take 0.5 mg by mouth 2 (two) times daily.     Historical Provider, MD  methocarbamol (ROBAXIN) 500 MG tablet Take 2 tablets (1,000 mg total) by mouth 4 (four) times daily. 07/23/14   Carlisle Cater, PA-C  Multiple Vitamin (MULITIVITAMIN WITH MINERALS) TABS Take 1 tablet by mouth every morning.     Historical Provider, MD  pantoprazole (PROTONIX) 40 MG tablet Take 1 tablet (40 mg total) by mouth 2 (two) times daily. 06/05/14   Charlynne Cousins, MD  promethazine (PHENERGAN) 25 MG tablet Take 1 tablet (25 mg total) by mouth every 6 (six) hours as needed for nausea or vomiting. 03/10/14   Larene Pickett, PA-C   Physical Exam: Filed Vitals:   07/29/14 1858 07/29/14 2043 07/29/14 2148  BP: 131/89 140/98 140/91  Pulse: 108 86 80  Temp: 98.2 F (36.8 C)  97.9 F (36.6 C)  TempSrc: Oral  Oral  Resp: 20 18 20   Height: 5\' 4"  (1.626 m)    Weight: 58.968 kg (130 lb)    SpO2: 100% 100% 100%    Wt Readings from Last 3 Encounters:  07/29/14 58.968 kg (130 lb)  06/05/14 58.832 kg (129 lb 11.2 oz)  03/15/14 57.153 kg (126 lb)    General:  Appears calm and comfortable Eyes: PERRL, normal lids, irises & conjunctiva ENT: grossly normal hearing, lips & tongue Neck: no LAD, masses or thyromegaly Cardiovascular: RRR, no m/r/g. No LE edema. Respiratory: CTA bilaterally, no w/r/r. Normal respiratory effort. Abdomen: soft, ntnd Skin: no rash or induration seen on limited exam Musculoskeletal: grossly normal tone BUE/BLE Psychiatric: grossly normal mood and affect, speech fluent and  appropriate Neurologic: grossly non-focal.          Labs on Admission:  Basic Metabolic Panel:  Recent Labs Lab 07/29/14 1929 07/29/14 2016  NA  --  140  K  --  2.2*  CL  --  107  CO2  --  22  GLUCOSE  --  94  BUN  --  11  CREATININE  --  0.72  CALCIUM  --  8.6  MG 1.9  --    Liver Function Tests:  Recent Labs Lab 07/29/14 1929  AST 25  ALT 21  ALKPHOS 121*  BILITOT 0.8  PROT 6.6  ALBUMIN 4.2    Recent Labs Lab 07/29/14 1929  LIPASE 16   No results for input(s): AMMONIA in the last 168  hours. CBC:  Recent Labs Lab 07/29/14 2016  WBC 8.2  NEUTROABS 4.4  HGB 12.2  HCT 34.9*  MCV 102.3*  PLT 238   Cardiac Enzymes: No results for input(s): CKTOTAL, CKMB, CKMBINDEX, TROPONINI in the last 168 hours.  BNP (last 3 results)  Recent Labs  04/10/14 1025  BNP 51.4    ProBNP (last 3 results)  Recent Labs  03/10/14 1201  PROBNP 227.7*    CBG: No results for input(s): GLUCAP in the last 168 hours.  Radiological Exams on Admission: No results found.   Assessment/Plan Active Problems:   Hypothyroid   Collagenous colitis   Hypokalemia   Nausea & vomiting   Hypokalemia, gastrointestinal losses   1. Severe Hypokalemia due to GI loss -will replete with potassium IV runs -check mag levels -will repeat labs in am -place on telemetry monitor  2. Collagenous Colitis -not being followed by GI -supportive care  3. Nausea and Vomiting -phenergan as needed -hydrate as ordered  4. Macrocytosis -will check folate B12 iron studies  5. Anxiety disorder -on ativan will continue  6. H/o WPW syndrome -she has had ablation -monitor on telemetry  7. Hypothyroid -will check TSH   Code Status: Full Code (must indicate code status--if unknown or must be presumed, indicate so) DVT Prophylaxis:SCD Family Communication: None (indicate person spoken with, if applicable, with phone number if by telephone) Disposition Plan: Home (indicate  anticipated LOS)  Time spent: 18min  Shavonne Ambroise A [facility] Pager [phone]

## 2014-07-29 NOTE — ED Notes (Signed)
Was seen by her MD today with vomiting diarrhea palpitations and tingling. Her MD called and told her she has a K+ of 2.6

## 2014-07-29 NOTE — ED Provider Notes (Signed)
CSN: [id number]     Arrival date & time 07/29/14  3500 History  This chart was scribed for Davonna Belling, MD by Irene Pap, ED Scribe. This patient was seen in room MH09/MH09 and patient care was started at 8:52 PM.    Chief Complaint  Patient presents with  . Abnormal Lab   The history is provided by the patient. No language interpreter was used.    HPI Comments: Dawn Frost is a 49 y.o. female with a history of collagenous colitis who presents to the Emergency Department complaining of vomiting, diarrhea, palpations and tingling in her feet and legs onset earlier today. She states that these problems began Sunday. She states that she went to see Dr. Almedia Balls who states that her potassium was 2.6. She reports that she is unable to eat and that she is unable to keep down food. She reports that these are similar to past episodes. She reports taking Phenogren to no relief. She denies fever or chills. She denies having a gall bladder.  She reports allergies to NSAIDs, Zofran, and amoxicillin.   Past Medical History  Diagnosis Date  . Collagenous colitis   . WPW (Wolff-Parkinson-White syndrome)   . Addison disease   . Thyroid disease   . Clostridium difficile diarrhea   . Anxiety and depression   . Chronic diarrhea   . Chronic back pain   . Drug-seeking behavior   . Irritable bowel syndrome (IBS)   . Anemia   . Pancreatitis   . Kidney stone   . Kidney stone   . Depression   . Anxiety   . Vaginal delivery [id number], 1998   Past Surgical History  Procedure Laterality Date  . Cholecystectomy    . Tubal ligation    . Lithotripsy    . Sphincterotomy    . Cardiac electrophysiology mapping and ablation      for WPW  . Video bronchoscopy  08/31/2011    Procedure: VIDEO BRONCHOSCOPY WITH FLUORO;  Surgeon: Elsie Stain, MD;  Location: [facility];  Service: Cardiopulmonary;  Laterality: N/A;  . Vaginal hysterectomy  10/28/2013    Procedure: HYSTERECTOMY VAGINAL;  Surgeon:  Emily Filbert, MD;  Location: [facility];  Service: Gynecology;;  . Salpingoophorectomy Bilateral 10/28/2013    Procedure: SALPINGO OOPHORECTOMY;  Surgeon: Emily Filbert, MD;  Location: [facility];  Service: Gynecology;  Laterality: Bilateral;  . Abdominal hysterectomy  10/28/2013  . Esophagogastroduodenoscopy (egd) with propofol N/A 06/04/2014    Procedure: ESOPHAGOGASTRODUODENOSCOPY (EGD) WITH PROPOFOL;  Surgeon: Jeryl Columbia, MD;  Location: [facility];  Service: Endoscopy;  Laterality: N/A;   Family History  Problem Relation Age of Onset  . Prostate cancer Father   . Coronary artery disease Maternal Grandfather    History  Substance Use Topics  . Smoking status: Current Every Day Smoker -- 0.25 packs/day for 30 years    Types: Cigarettes  . Smokeless tobacco: Never Used  . Alcohol Use: Yes     Comment: seldom   OB History    Gravida Para Term Preterm AB TAB SAB Ectopic Multiple Living   3 3 3       3      Review of Systems  Constitutional: Positive for appetite change. Negative for fever and chills.  Cardiovascular: Positive for palpitations.  Gastrointestinal: Positive for vomiting and diarrhea.  Neurological: Positive for numbness.  All other systems reviewed and are negative.   Allergies  Prednisone; Amoxicillin; Butrans; Nsaids; and Zofran  Home Medications  Prior to Admission medications   Medication Sig Start Date End Date Taking? Authorizing Provider  acetaminophen (TYLENOL) 325 MG tablet Take 650 mg by mouth every 6 (six) hours as needed for mild pain or moderate pain (back pain).    Yes Historical Provider, MD  escitalopram (LEXAPRO) 20 MG tablet Take 20 mg by mouth every morning.    Yes Historical Provider, MD  LORazepam (ATIVAN) 1 MG tablet Take 1 mg by mouth 2 (two) times daily as needed for anxiety (anxiety).    Yes Historical Provider, MD  methocarbamol (ROBAXIN) 500 MG tablet Take 2 tablets (1,000 mg total) by mouth 4 (four) times daily. 07/23/14  Yes Carlisle Cater, PA-C   Multiple Vitamin (MULITIVITAMIN WITH MINERALS) TABS Take 1 tablet by mouth every morning.    Yes Historical Provider, MD  pantoprazole (PROTONIX) 40 MG tablet Take 1 tablet (40 mg total) by mouth 2 (two) times daily. 06/05/14  Yes Charlynne Cousins, MD  potassium chloride (K-DUR,KLOR-CON) 10 MEQ tablet Take 2 tablets by mouth daily. 07/29/14  Yes Historical Provider, MD  promethazine (PHENERGAN) 25 MG tablet Take 1 tablet (25 mg total) by mouth every 6 (six) hours as needed for nausea or vomiting. 03/10/14  Yes Larene Pickett, PA-C   BP 140/98 mmHg  Pulse 86  Temp(Src) 98.2 F (36.8 C) (Oral)  Resp 18  Ht 5\' 4"  (1.626 m)  Wt 130 lb (58.968 kg)  BMI 22.30 kg/m2  SpO2 100%  LMP 08/06/2013   Physical Exam  Constitutional: She appears well-developed and well-nourished. She is cooperative. No distress.  HENT:  Head: Normocephalic and atraumatic.  Eyes: Conjunctivae are normal. Right eye exhibits no discharge. Left eye exhibits no discharge.  Neck: Neck supple.  Cardiovascular: Normal rate, regular rhythm and normal heart sounds.  Exam reveals no gallop and no friction rub.   No murmur heard. Pulmonary/Chest: Effort normal and breath sounds normal. No respiratory distress.  Abdominal: Soft. She exhibits no distension. There is no tenderness.  Genitourinary:  Right flank tenderness  Musculoskeletal: She exhibits no edema or tenderness.  No CVA tenderness  Neurological: She is alert.  Skin: Skin is warm and dry.  Psychiatric: She has a normal mood and affect. Her behavior is normal. Thought content normal.  Appears uncomfortable  Nursing note and vitals reviewed.   ED Course  Procedures (including critical care time)  8:56 PM-Discussed treatment plan which includes potassium and nausea medication with pt at bedside and pt agreed to plan.   Labs Review Labs Reviewed  BASIC METABOLIC PANEL - Abnormal; Notable for the following:    Potassium 2.2 (*)    All other components within  normal limits  CBC WITH DIFFERENTIAL/PLATELET - Abnormal; Notable for the following:    RBC 3.41 (*)    HCT 34.9 (*)    MCV 102.3 (*)    MCH 35.8 (*)    All other components within normal limits  HEPATIC FUNCTION PANEL - Abnormal; Notable for the following:    Alkaline Phosphatase 121 (*)    All other components within normal limits  MAGNESIUM  LIPASE, BLOOD  MAGNESIUM  FERRITIN  IRON AND TIBC  VITAMIN B12  FOLATE RBC  TSH  HEMOGLOBIN A1C  COMPREHENSIVE METABOLIC PANEL  CBC    Imaging Review No results found.   EKG Interpretation   Date/Time:  Tuesday July 29 2014 19:03:25 EDT Ventricular Rate:  106 PR Interval:    QRS Duration: 84 QT Interval:  488 QTC Calculation: 648 R  Axis:   46 Text Interpretation:  Critical Test Result: Long QTc Sinus rhythm  Anterior infarct , age undetermined ST \\T \ T wave abnormality, consider  lateral ischemia Prolonged QT Abnormal ECG Confirmed by Alvino Chapel  MD,  Ovid Curd [phone]) on 07/29/2014 8:42:55 PM      MDM   Final diagnoses:  Hypokalemia  Nausea vomiting and diarrhea    Patient with hypokalemia. Has nausea vomiting diarrhea with a history of colitis. Has been unable tolerate orals and will admit to internal medicine. Discussed with Dr. Humphrey Rolls at [facility] I personally performed the services described in this documentation, which was scribed in my presence. The recorded information has been reviewed and is accurate.     Davonna Belling, MD 07/30/14 [phone]

## 2014-07-30 LAB — IRON AND TIBC
Iron: 177 ug/dL — ABNORMAL HIGH (ref 42–145)
SATURATION RATIOS: 84 % — AB (ref 20–55)
TIBC: 211 ug/dL — ABNORMAL LOW (ref 250–470)
UIBC: 34 ug/dL — ABNORMAL LOW (ref 125–400)

## 2014-07-30 LAB — COMPREHENSIVE METABOLIC PANEL
ALK PHOS: 101 U/L (ref 39–117)
ALT: 17 U/L (ref 0–35)
AST: 19 U/L (ref 0–37)
Albumin: 3.4 g/dL — ABNORMAL LOW (ref 3.5–5.2)
Anion gap: 10 (ref 5–15)
BILIRUBIN TOTAL: 0.6 mg/dL (ref 0.3–1.2)
BUN: 8 mg/dL (ref 6–23)
CHLORIDE: 112 mmol/L (ref 96–112)
CO2: 22 mmol/L (ref 19–32)
Calcium: 8.1 mg/dL — ABNORMAL LOW (ref 8.4–10.5)
Creatinine, Ser: 0.62 mg/dL (ref 0.50–1.10)
GFR calc Af Amer: >90 mL/min (ref >90)
GFR calc non Af Amer: >90 mL/min (ref >90)
Glucose, Bld: 87 mg/dL (ref 70–99)
Potassium: 2.4 mmol/L — CL (ref 3.5–5.1)
Sodium: 144 mmol/L (ref 135–145)
Total Protein: 5.4 g/dL — ABNORMAL LOW (ref 6.0–8.3)

## 2014-07-30 LAB — TSH: TSH: 1.923 u[IU]/mL (ref 0.350–4.500)

## 2014-07-30 LAB — FERRITIN: Ferritin: 39 ng/mL (ref 10–291)

## 2014-07-30 LAB — CBC
HEMATOCRIT: 31.8 % — AB (ref 36.0–46.0)
Hemoglobin: 10.8 g/dL — ABNORMAL LOW (ref 12.0–15.0)
MCH: 35.6 pg — ABNORMAL HIGH (ref 26.0–34.0)
MCHC: 34 g/dL (ref 30.0–36.0)
MCV: 105 fL — ABNORMAL HIGH (ref 78.0–100.0)
Platelets: 203 10*3/uL (ref 150–400)
RBC: 3.03 MIL/uL — ABNORMAL LOW (ref 3.87–5.11)
RDW: 14.2 % (ref 11.5–15.5)
WBC: 5.9 10*3/uL (ref 4.0–10.5)

## 2014-07-30 LAB — GLUCOSE, CAPILLARY: GLUCOSE-CAPILLARY: 141 mg/dL — AB (ref 70–99)

## 2014-07-30 LAB — VITAMIN B12: Vitamin B-12: 340 pg/mL (ref 211–911)

## 2014-07-30 LAB — MAGNESIUM: Magnesium: 1.9 mg/dL (ref 1.5–2.5)

## 2014-07-30 MED ORDER — MAGNESIUM SULFATE 2 GM/50ML IV SOLN
2.0000 g | Freq: Once | INTRAVENOUS | Status: AC
  Administered 2014-07-30: 2 g via INTRAVENOUS
  Filled 2014-07-30: qty 50

## 2014-07-30 MED ORDER — POTASSIUM CHLORIDE CRYS ER 20 MEQ PO TBCR
40.0000 meq | EXTENDED_RELEASE_TABLET | Freq: Four times a day (QID) | ORAL | Status: AC
Start: 2014-07-30 — End: 2014-07-30
  Administered 2014-07-30 (×2): 40 meq via ORAL
  Filled 2014-07-30 (×2): qty 2

## 2014-07-30 MED ORDER — LORAZEPAM 0.5 MG PO TABS
0.5000 mg | ORAL_TABLET | Freq: Once | ORAL | Status: AC
  Administered 2014-07-30: 0.5 mg via ORAL
  Filled 2014-07-30: qty 1

## 2014-07-30 MED ORDER — SODIUM CHLORIDE 0.9 % IV SOLN
INTRAVENOUS | Status: DC
  Administered 2014-07-30 – 2014-07-31 (×2): via INTRAVENOUS
  Filled 2014-07-30 (×3): qty 1000

## 2014-07-30 MED ORDER — POTASSIUM CHLORIDE 10 MEQ/100ML IV SOLN
10.0000 meq | INTRAVENOUS | Status: AC
  Administered 2014-07-30 (×2): 10 meq via INTRAVENOUS
  Filled 2014-07-30 (×2): qty 100

## 2014-07-30 NOTE — Progress Notes (Signed)
[facility] PROGRESS NOTE   Dawn Frost [id number] DOB: 03/22/1966 DOA: 07/29/2014 PCP: No PCP Per Patient  HPI/Subjective: Presented with diarrhea, nausea and slight vomiting.  Assessment/Plan: Active Problems:   Hypothyroid   Collagenous colitis   Hypokalemia   Nausea & vomiting   Hypokalemia, gastrointestinal losses   Microscopic colitis The collagenous subtype of microscopic colitis, patient presented with diarrhea, nausea and slight vomiting. She think this is probably exacerbation of her collagenous colitis. Patient not been before on Entocort, will curbside GI prior to using it. Last biopsy in October 2012 showed lymphocytic colitis.  Hypokalemia Significant hypokalemia with potassium of 2.2 in admission, likely secondary to GI losses. Potassium increased to 2.4 after 4 rounds of IV potassium. Switch to oral supplementation as patient nausea improved, added potassium to the IV fluids. Give extra dose of magnesium, check BMP and magnesium in a.m.  Macrocytosis Folate and B12 levels are okay.  Anxiety disorder Patient is on Ativan will continue.   Code Status: Full Code Family Communication: Plan discussed with the patient. Disposition Plan: Remains inpatient Diet: Diet clear liquid Room service appropriate?: Yes; Fluid consistency:: Thin  Consultants:  None  Procedures:  None  Antibiotics:  None   Objective: Filed Vitals:   07/30/14 0611  BP: 116/74  Pulse: 61  Temp: 97.6 F (36.4 C)  Resp: 18    Intake/Output Summary (Last 24 hours) at 07/30/14 1216 Last data filed at 07/30/14 0942  Gross per 24 hour  Intake 1257.5 ml  Output      0 ml  Net 1257.5 ml   Filed Weights   07/29/14 1858 07/29/14 2300  Weight: 58.968 kg (130 lb) 48.2 kg (106 lb 4.2 oz)    Exam: General: Alert and awake, oriented x3, not in any acute distress. HEENT: anicteric sclera, pupils reactive to light and accommodation, EOMI CVS: S1-S2 clear, no  murmur rubs or gallops Chest: clear to auscultation bilaterally, no wheezing, rales or rhonchi Abdomen: soft nontender, nondistended, normal bowel sounds, no organomegaly Extremities: no cyanosis, clubbing or edema noted bilaterally Neuro: Cranial nerves II-XII intact, no focal neurological deficits  Data Reviewed: Basic Metabolic Panel:  Recent Labs Lab 07/29/14 1929 07/29/14 2016 07/30/14 0508  NA  --  140 144  K  --  2.2* 2.4*  CL  --  107 112  CO2  --  22 22  GLUCOSE  --  94 87  BUN  --  11 8  CREATININE  --  0.72 0.62  CALCIUM  --  8.6 8.1*  MG 1.9  --  1.9   Liver Function Tests:  Recent Labs Lab 07/29/14 1929 07/30/14 0508  AST 25 19  ALT 21 17  ALKPHOS 121* 101  BILITOT 0.8 0.6  PROT 6.6 5.4*  ALBUMIN 4.2 3.4*    Recent Labs Lab 07/29/14 1929  LIPASE 16   No results for input(s): AMMONIA in the last 168 hours. CBC:  Recent Labs Lab 07/29/14 2016 07/30/14 0508  WBC 8.2 5.9  NEUTROABS 4.4  --   HGB 12.2 10.8*  HCT 34.9* 31.8*  MCV 102.3* 105.0*  PLT 238 203   Cardiac Enzymes: No results for input(s): CKTOTAL, CKMB, CKMBINDEX, TROPONINI in the last 168 hours. BNP (last 3 results)  Recent Labs  04/10/14 1025  BNP 51.4    ProBNP (last 3 results)  Recent Labs  03/10/14 1201  PROBNP 227.7*    CBG:  Recent Labs Lab 07/30/14 0756  GLUCAP 141*    Micro No  results found for this or any previous visit (from the past 240 hour(s)).   Studies: No results found.  Scheduled Meds: . escitalopram  20 mg Oral q morning - 92H  . folic acid  1 mg Oral Daily  . LORazepam  0.5 mg Oral BID  . multivitamin with minerals  1 tablet Oral Daily  . sodium chloride  3 mL Intravenous Q12H  . thiamine  100 mg Oral Daily   Continuous Infusions: . sodium chloride 75 mL/hr at 07/29/14 2334       Time spent: 35 minutes    [facility] A  [facility] Pager [phone] If 7PM-7AM, please contact night-coverage at [email or web address],  password [facility] 07/30/2014, 12:16 PM  LOS: 1 day

## 2014-07-30 NOTE — Care Management Note (Addendum)
    Page 1 of 1   07/31/2014     2:59:49 PM CARE MANAGEMENT NOTE 07/31/2014  Patient:  Dawn Frost, Dawn Frost   Account Number:  [id number]  Date Initiated:  07/30/2014  Documentation initiated by:  Dessa Phi  Subjective/Objective Assessment:   49 y/o f admitted w/n/v/d.Colitis.     Action/Plan:   From home.   Anticipated DC Date:  07/31/2014   Anticipated DC Plan:  [place]  CM consult      Choice offered to / List presented to:             Status of service:  Completed, signed off Medicare Important Message given?   (If response is "NO", the following Medicare IM given date fields will be blank) Date Medicare IM given:   Medicare IM given by:   Date Additional Medicare IM given:   Additional Medicare IM given by:    Discharge Disposition:  HOME/SELF CARE  Per UR Regulation:  Reviewed for med. necessity/level of care/duration of stay  If discussed at [place] of Stay Meetings, dates discussed:    Comments:  07/31/14 Dessa Phi RN BSN NCM [phone] d/c home no needs or orders.  07/30/14 Dessa Phi RN BSN NCM [phone] No anticipated d/c needs.

## 2014-07-30 NOTE — Progress Notes (Signed)
CRITICAL VALUE ALERT  Critical value received:  Potassium 2.4  Date of notification:  07/30/2014  Time of notification:  0649  Critical value read back:Yes.    Nurse who received alert:  Sabino Gasser, RN  MD notified (1st page):  Reece Levy, MD  Time of first page:  [phone]  MD notified (2nd page):  Time of second page:  Responding MD:  Reece Levy, MD  Time MD responded:  [phone]

## 2014-07-31 LAB — BASIC METABOLIC PANEL
Anion gap: 4 — ABNORMAL LOW (ref 5–15)
BUN: 10 mg/dL (ref 6–23)
CALCIUM: 7.6 mg/dL — AB (ref 8.4–10.5)
CO2: 20 mmol/L (ref 19–32)
Chloride: 119 mmol/L — ABNORMAL HIGH (ref 96–112)
Creatinine, Ser: 0.58 mg/dL (ref 0.50–1.10)
GFR calc Af Amer: >90 mL/min (ref >90)
Glucose, Bld: 106 mg/dL — ABNORMAL HIGH (ref 70–99)
Potassium: 3.1 mmol/L — ABNORMAL LOW (ref 3.5–5.1)
SODIUM: 143 mmol/L (ref 135–145)

## 2014-07-31 LAB — HEMOGLOBIN A1C
HEMOGLOBIN A1C: 5.3 % (ref 4.8–5.6)
MEAN PLASMA GLUCOSE: 105 mg/dL

## 2014-07-31 LAB — FOLATE RBC
FOLATE, HEMOLYSATE: 338.9 ng/mL
FOLATE, RBC: 1083 ng/mL (ref >498)
Hematocrit: 31.3 % — ABNORMAL LOW (ref 34.0–46.6)

## 2014-07-31 LAB — GLUCOSE, CAPILLARY: Glucose-Capillary: 100 mg/dL — ABNORMAL HIGH (ref 70–99)

## 2014-07-31 LAB — MAGNESIUM: Magnesium: 2 mg/dL (ref 1.5–2.5)

## 2014-07-31 MED ORDER — BUDESONIDE 3 MG PO CPEP
9.0000 mg | ORAL_CAPSULE | Freq: Every day | ORAL | Status: DC
  Administered 2014-07-31: 9 mg via ORAL
  Filled 2014-07-31: qty 3

## 2014-07-31 MED ORDER — POTASSIUM CHLORIDE CRYS ER 10 MEQ PO TBCR
60.0000 meq | EXTENDED_RELEASE_TABLET | Freq: Four times a day (QID) | ORAL | Status: AC
  Administered 2014-07-31 (×2): 60 meq via ORAL
  Filled 2014-07-31 (×2): qty 6

## 2014-07-31 NOTE — Discharge Summary (Signed)
Physician Discharge Summary  Dawn Frost [id number]:[id number] DOB: Mar 04, 1966 DOA: 07/29/2014  PCP: No PCP Per Patient  Admit date: 07/29/2014 Discharge date: 07/31/2014  Time spent: 40 minutes  Recommendations for Outpatient Follow-up:  1. Follow-up with primary care physician within one week. 2. Patient does not want a follow-up with physician here as she is actively moving to [place].  Discharge Diagnoses:  Active Problems:   Hypothyroid   Collagenous colitis   Hypokalemia   Nausea & vomiting   Hypokalemia, gastrointestinal losses   Discharge Condition: Stable  Diet recommendation: Heart healthy  Filed Weights   07/29/14 1858 07/29/14 2300  Weight: 58.968 kg (130 lb) 48.2 kg (106 lb 4.2 oz)    History of present illness:  Dawn Frost is a 49 y.o. female presents with nausea and vomiting. Patient has a history of collagenous colitis and anxiety disorder with prior admissions for intractable vomiting who presents with similar complaints. She states that ina ddition she has been having abdominal pain. She states this started on Sunday She states that she went to her PCP todayu had labs drawn and was found to have severe hypokalemia. She has no headache but admits to dizziness. She has no chest pain noted. She has no swelling of her legs. She has had some diarrhea with no blood in the stools. She states that she has had no fevers noted. She states that she has a history of collagenous colitis which she has not seen a GI for in a while. She was seen a long time ago in [facility] but has not followed up with anyone.  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 She think this is probably exacerbation of her microscopic colitis. Last biopsy in October 2012 showed lymphocytic colitis. Patient reported that she is on 9 mg of Entocort, restarted, instructed to avoid  NSAIDs. Patient feels better today, discharged to follow-up with her primary gastroenterologist.  Hypokalemia Significant hypokalemia with potassium of 2.2 in admission, likely secondary to GI losses. Potassium increased to 2.4 after 4 rounds of IV potassium. Switch to oral supplementation as patient nausea improved, added potassium to the IV fluids. Give extra dose of magnesium, check BMP and magnesium in a.m. Potassium today is 3.1, another 120 mEq of potassium given prior to discharge. I asked patient to stay for one more day to ensure resolution of diarrhea and hypokalemia but she reported that she is the middle of moving to [place], [place] she needs to leave the hospital as soon as possible.  Macrocytosis Folate and B12 levels are okay.  Anxiety disorder Patient is on Ativan will continue.   Procedures:  None  Consultations:  None  Discharge Exam: Filed Vitals:   07/31/14 0335  BP: 137/88  Pulse: 61  Temp: 97.9 F (36.6 C)  Resp: 16   General: Alert and awake, oriented x3, not in any acute distress. HEENT: anicteric sclera, pupils reactive to light and accommodation, EOMI CVS: S1-S2 clear, no murmur rubs or gallops Chest: clear to auscultation bilaterally, no wheezing, rales or rhonchi Abdomen: soft nontender, nondistended, normal bowel sounds, no organomegaly Extremities: no cyanosis, clubbing or edema noted bilaterally Neuro: Cranial nerves II-XII intact, no focal neurological deficits  Discharge Instructions   Discharge Instructions    Diet - low sodium heart healthy    Complete by:  As directed      Increase activity slowly    Complete by:  As directed  Current Discharge Medication List    START taking these medications   Details  budesonide (ENTOCORT EC) 3 MG 24 hr capsule Take 3 capsules (9 mg total) by mouth daily.      CONTINUE these medications which have NOT CHANGED   Details  acetaminophen (TYLENOL) 325 MG tablet Take 650  mg by mouth every 6 (six) hours as needed for mild pain or moderate pain (back pain).     escitalopram (LEXAPRO) 20 MG tablet Take 20 mg by mouth every morning.    Associated Diagnoses: Postmenopausal bleeding    LORazepam (ATIVAN) 1 MG tablet Take 1 mg by mouth 2 (two) times daily as needed for anxiety (anxiety).     methocarbamol (ROBAXIN) 500 MG tablet Take 2 tablets (1,000 mg total) by mouth 4 (four) times daily. Qty: 20 tablet, Refills: 0    Multiple Vitamin (MULITIVITAMIN WITH MINERALS) TABS Take 1 tablet by mouth every morning.     pantoprazole (PROTONIX) 40 MG tablet Take 1 tablet (40 mg total) by mouth 2 (two) times daily. Qty: 60 tablet, Refills: 3    potassium chloride (K-DUR,KLOR-CON) 10 MEQ tablet Take 2 tablets by mouth daily.    promethazine (PHENERGAN) 25 MG tablet Take 1 tablet (25 mg total) by mouth every 6 (six) hours as needed for nausea or vomiting. Qty: 12 tablet, Refills: 0       Allergies  Allergen Reactions  . Prednisone Other (See Comments)    Hyperactivity and agitation  . Amoxicillin     Pt states she had a bad reaction to amox  . Butrans [Buprenorphine] Other (See Comments)    Burned skin  . Nsaids Other (See Comments)    Bad for colitis  . Zofran Other (See Comments)    Headache       The results of significant diagnostics from this hospitalization (including imaging, microbiology, ancillary and laboratory) are listed below for reference.    Significant Diagnostic Studies: Dg Lumbar Spine Complete  07/23/2014   CLINICAL DATA:  49 year old female new with left buttock and coccyx pain after being thrown from a horse 1 day previously  EXAM: LUMBAR SPINE - COMPLETE 4+ VIEW  COMPARISON:  Concurrently obtained radiographs of the sacrum and coccyx; prior CT abdomen and pelvis 04/10/2014  FINDINGS: There is no evidence of lumbar spine fracture. Alignment is normal. Intervertebral disc spaces are maintained.  IMPRESSION: Negative.   Electronically  Signed   By: Jacqulynn Cadet M.D.   On: 07/23/2014 19:29   Dg Sacrum/coccyx  07/23/2014   CLINICAL DATA:  Back pain following being thrown from a horse 1 day ago, initial encounter  EXAM: SACRUM AND COCCYX - 2+ VIEW  COMPARISON:  04/10/2014  FINDINGS: The pelvic ring is intact. The sacrum appears intact. Anterior orientation of the coccyx is noted but stable from a previous exam.  IMPRESSION: No acute abnormality is noted.   Electronically Signed   By: Inez Catalina M.D.   On: 07/23/2014 19:31    Microbiology: No results found for this or any previous visit (from the past 240 hour(s)).   Labs: Basic Metabolic Panel:  Recent Labs Lab 07/29/14 1929 07/29/14 2016 07/30/14 0508 07/31/14 0440  NA  --  140 144 143  K  --  2.2* 2.4* 3.1*  CL  --  107 112 119*  CO2  --  22 22 20   GLUCOSE  --  94 87 106*  BUN  --  11 8 10   CREATININE  --  0.72  0.62 0.58  CALCIUM  --  8.6 8.1* 7.6*  MG 1.9  --  1.9 2.0   Liver Function Tests:  Recent Labs Lab 07/29/14 1929 07/30/14 0508  AST 25 19  ALT 21 17  ALKPHOS 121* 101  BILITOT 0.8 0.6  PROT 6.6 5.4*  ALBUMIN 4.2 3.4*    Recent Labs Lab 07/29/14 1929  LIPASE 16   No results for input(s): AMMONIA in the last 168 hours. CBC:  Recent Labs Lab 07/29/14 2016 07/30/14 0508  WBC 8.2 5.9  NEUTROABS 4.4  --   HGB 12.2 10.8*  HCT 34.9* 31.8*  MCV 102.3* 105.0*  PLT 238 203   Cardiac Enzymes: No results for input(s): CKTOTAL, CKMB, CKMBINDEX, TROPONINI in the last 168 hours. BNP: BNP (last 3 results)  Recent Labs  04/10/14 1025  BNP 51.4    ProBNP (last 3 results)  Recent Labs  03/10/14 1201  PROBNP 227.7*    CBG:  Recent Labs Lab 07/30/14 0756 07/31/14 0748  GLUCAP 141* 100*       Signed:  Eliam Snapp A  [facility] 07/31/2014, 1:53 PM

## 2014-08-13 ENCOUNTER — Emergency Department (HOSPITAL_BASED_OUTPATIENT_CLINIC_OR_DEPARTMENT_OTHER)
Admission: EM | Admit: 2014-08-13 | Discharge: 2014-08-14 | Disposition: A | Discharge status: Home / Self Care | Payer: BLUE CROSS/BLUE SHIELD | Attending: Emergency Medicine | Admitting: Emergency Medicine

## 2014-08-13 ENCOUNTER — Encounter (HOSPITAL_BASED_OUTPATIENT_CLINIC_OR_DEPARTMENT_OTHER): Payer: Self-pay

## 2014-08-13 DIAGNOSIS — Z8739 Personal history of other diseases of the musculoskeletal system and connective tissue: Secondary | ICD-10-CM | POA: Insufficient documentation

## 2014-08-13 DIAGNOSIS — E860 Dehydration: Secondary | ICD-10-CM | POA: Insufficient documentation

## 2014-08-13 DIAGNOSIS — R1110 Vomiting, unspecified: Secondary | ICD-10-CM

## 2014-08-13 DIAGNOSIS — Z87442 Personal history of urinary calculi: Secondary | ICD-10-CM | POA: Insufficient documentation

## 2014-08-13 DIAGNOSIS — Z862 Personal history of diseases of the blood and blood-forming organs and certain disorders involving the immune mechanism: Secondary | ICD-10-CM | POA: Diagnosis not present

## 2014-08-13 DIAGNOSIS — E876 Hypokalemia: Secondary | ICD-10-CM | POA: Diagnosis not present

## 2014-08-13 DIAGNOSIS — R197 Diarrhea, unspecified: Secondary | ICD-10-CM

## 2014-08-13 DIAGNOSIS — Z8679 Personal history of other diseases of the circulatory system: Secondary | ICD-10-CM | POA: Diagnosis not present

## 2014-08-13 DIAGNOSIS — F329 Major depressive disorder, single episode, unspecified: Secondary | ICD-10-CM | POA: Insufficient documentation

## 2014-08-13 DIAGNOSIS — Z8719 Personal history of other diseases of the digestive system: Secondary | ICD-10-CM | POA: Insufficient documentation

## 2014-08-13 DIAGNOSIS — F419 Anxiety disorder, unspecified: Secondary | ICD-10-CM | POA: Diagnosis not present

## 2014-08-13 DIAGNOSIS — Z8619 Personal history of other infectious and parasitic diseases: Secondary | ICD-10-CM | POA: Insufficient documentation

## 2014-08-13 DIAGNOSIS — Z720 Tobacco use: Secondary | ICD-10-CM | POA: Insufficient documentation

## 2014-08-13 DIAGNOSIS — G8929 Other chronic pain: Secondary | ICD-10-CM | POA: Diagnosis not present

## 2014-08-13 HISTORY — DX: Hypokalemia: E87.6

## 2014-08-13 LAB — BASIC METABOLIC PANEL
Anion gap: 12 (ref 5–15)
Anion gap: 11 (ref 5–15)
BUN: 14 mg/dL (ref 6–20)
BUN: 11 mg/dL (ref 6–20)
CALCIUM: 8.5 mg/dL — AB (ref 8.9–10.3)
CHLORIDE: 109 mmol/L (ref 101–111)
CO2: 19 mmol/L — ABNORMAL LOW (ref 22–32)
CO2: 18 mmol/L — ABNORMAL LOW (ref 22–32)
Calcium: 9.3 mg/dL (ref 8.9–10.3)
Chloride: 112 mmol/L — ABNORMAL HIGH (ref 101–111)
Creatinine, Ser: 0.85 mg/dL (ref 0.44–1.00)
Creatinine, Ser: 0.63 mg/dL (ref 0.44–1.00)
GFR calc Af Amer: >60 mL/min (ref >60)
GFR calc Af Amer: >60 mL/min (ref >60)
GFR calc non Af Amer: >60 mL/min (ref >60)
GFR calc non Af Amer: >60 mL/min (ref >60)
GLUCOSE: 86 mg/dL (ref 70–99)
Glucose, Bld: 110 mg/dL — ABNORMAL HIGH (ref 70–99)
POTASSIUM: 2.6 mmol/L — AB (ref 3.5–5.1)
Potassium: 3 mmol/L — ABNORMAL LOW (ref 3.5–5.1)
SODIUM: 140 mmol/L (ref 135–145)
Sodium: 141 mmol/L (ref 135–145)

## 2014-08-13 LAB — CBC WITH DIFFERENTIAL/PLATELET
Basophils Absolute: 0 10*3/uL (ref 0.0–0.1)
Basophils Relative: 0 % (ref 0–1)
Eosinophils Absolute: 0.3 10*3/uL (ref 0.0–0.7)
Eosinophils Relative: 3 % (ref 0–5)
HCT: 36 % (ref 36.0–46.0)
Hemoglobin: 12.2 g/dL (ref 12.0–15.0)
LYMPHS ABS: 2 10*3/uL (ref 0.7–4.0)
LYMPHS PCT: 20 % (ref 12–46)
MCH: 36.2 pg — ABNORMAL HIGH (ref 26.0–34.0)
MCHC: 33.9 g/dL (ref 30.0–36.0)
MCV: 106.8 fL — ABNORMAL HIGH (ref 78.0–100.0)
MONOS PCT: 6 % (ref 3–12)
Monocytes Absolute: 0.6 10*3/uL (ref 0.1–1.0)
NEUTROS ABS: 7.3 10*3/uL (ref 1.7–7.7)
NEUTROS PCT: 71 % (ref 43–77)
PLATELETS: 231 10*3/uL (ref 150–400)
RBC: 3.37 MIL/uL — AB (ref 3.87–5.11)
RDW: 13.7 % (ref 11.5–15.5)
WBC: 10.2 10*3/uL (ref 4.0–10.5)

## 2014-08-13 LAB — TROPONIN I

## 2014-08-13 MED ORDER — LORAZEPAM 2 MG/ML IJ SOLN
1.0000 mg | Freq: Once | INTRAMUSCULAR | Status: AC
  Administered 2014-08-13: 1 mg via INTRAVENOUS
  Filled 2014-08-13: qty 1

## 2014-08-13 MED ORDER — POTASSIUM CHLORIDE 10 MEQ/100ML IV SOLN
10.0000 meq | Freq: Once | INTRAVENOUS | Status: AC
  Administered 2014-08-13: 10 meq via INTRAVENOUS
  Filled 2014-08-13: qty 100

## 2014-08-13 MED ORDER — SODIUM CHLORIDE 0.9 % IV BOLUS (SEPSIS)
1000.0000 mL | Freq: Once | INTRAVENOUS | Status: AC
  Administered 2014-08-13: 1000 mL via INTRAVENOUS

## 2014-08-13 MED ORDER — PROMETHAZINE HCL 25 MG/ML IJ SOLN
25.0000 mg | Freq: Once | INTRAMUSCULAR | Status: AC
  Administered 2014-08-13: 25 mg via INTRAMUSCULAR
  Filled 2014-08-13: qty 1

## 2014-08-13 MED ORDER — POTASSIUM CHLORIDE 10 MEQ/100ML IV SOLN: 10.0000 meq | Freq: Once | INTRAVENOUS | Status: DC

## 2014-08-13 MED ORDER — SODIUM CHLORIDE 0.9 % IV BOLUS (SEPSIS)
500.0000 mL | Freq: Once | INTRAVENOUS | Status: AC
  Administered 2014-08-13: 500 mL via INTRAVENOUS

## 2014-08-13 MED ORDER — POTASSIUM CHLORIDE CRYS ER 20 MEQ PO TBCR
40.0000 meq | EXTENDED_RELEASE_TABLET | Freq: Once | ORAL | Status: AC
  Administered 2014-08-13: 40 meq via ORAL
  Filled 2014-08-13: qty 2

## 2014-08-13 MED ORDER — MORPHINE SULFATE 4 MG/ML IJ SOLN
4.0000 mg | Freq: Once | INTRAMUSCULAR | Status: AC
  Administered 2014-08-13: 4 mg via INTRAVENOUS
  Filled 2014-08-13: qty 1

## 2014-08-13 MED ORDER — POTASSIUM CHLORIDE 10 MEQ/100ML IV SOLN
10.0000 meq | Freq: Once | INTRAVENOUS | Status: AC
  Administered 2014-08-13: 10 meq via INTRAVENOUS

## 2014-08-13 MED ORDER — PROMETHAZINE HCL 25 MG PO TABS: 25.0000 mg | ORAL_TABLET | Freq: Once | ORAL | Status: DC

## 2014-08-13 MED ORDER — POTASSIUM CHLORIDE 10 MEQ/100ML IV SOLN
INTRAVENOUS | Status: AC
  Filled 2014-08-13: qty 100

## 2014-08-13 NOTE — ED Provider Notes (Signed)
CSN: [id number]     Arrival date & time 08/13/14  1805 History   First MD Initiated Contact with Patient 08/13/14 1811     This chart was scribed for Ripley Fraise, MD by Forrestine Him, ED Scribe. This patient was seen in room MH02/MH02 and the patient's care was started 6:31 PM.   Chief Complaint  Patient presents with  . Emesis   Patient is a 49 y.o. female presenting with vomiting. The history is provided by the patient. No language interpreter was used.  Emesis Severity:  Moderate Duration:  2 days Timing:  Constant Number of daily episodes:  Multiple  Progression:  Unchanged Chronicity:  Recurrent Associated symptoms: abdominal pain and diarrhea   Associated symptoms: no chills   Abdominal pain:    Location:  RLQ   Severity:  Moderate   Onset quality:  Gradual   Duration:  2 days   Timing:  Constant   Progression:  Unchanged   Chronicity:  Recurrent Diarrhea:    Number of occurrences:  Multiple   Severity:  Severe   Duration:  2 days   Timing:  Intermittent   Progression:  Unchanged Risk factors: no sick contacts     HPI Comments: Dawn Frost is a 49 y.o. female with a PMHx of WPW, Addison disease, Thyroid disease, Colitis, and Hypokalemia who presents to the Emergency Department complaining of several episodes of intermittent emesis x 2 days. Pt also reports multiple episodes of diarrhea and constant RLQ abdominal pain that radiates to her back. No OTC medications attempted prior to arrival. No recent fever or chills. Dawn Frost was recently seen by her PCP and had blood drawn in office. Results came back earlier today with a Potassium of 2.6. Pt was evaluated in ED 4/27 for same set of symptoms and was admitted to hospital due to hypokalemia. Pt with known allergies to Prednisone, Amoxicillin, NSAIDS, Butrans, and Zofran.  Past Medical History  Diagnosis Date  . Collagenous colitis   . WPW (Wolff-Parkinson-White syndrome)   . Addison disease   . Thyroid disease    . Clostridium difficile diarrhea   . Anxiety and depression   . Chronic diarrhea   . Chronic back pain   . Drug-seeking behavior   . Irritable bowel syndrome (IBS)   . Anemia   . Pancreatitis   . Kidney stone   . Kidney stone   . Depression   . Anxiety   . Vaginal delivery [id number], 1998  . Hypokalemia    Past Surgical History  Procedure Laterality Date  . Cholecystectomy    . Tubal ligation    . Lithotripsy    . Sphincterotomy    . Cardiac electrophysiology mapping and ablation      for WPW  . Video bronchoscopy  08/31/2011    Procedure: VIDEO BRONCHOSCOPY WITH FLUORO;  Surgeon: Elsie Stain, MD;  Location: [facility];  Service: Cardiopulmonary;  Laterality: N/A;  . Vaginal hysterectomy  10/28/2013    Procedure: HYSTERECTOMY VAGINAL;  Surgeon: Emily Filbert, MD;  Location: [facility];  Service: Gynecology;;  . Salpingoophorectomy Bilateral 10/28/2013    Procedure: SALPINGO OOPHORECTOMY;  Surgeon: Emily Filbert, MD;  Location: [facility];  Service: Gynecology;  Laterality: Bilateral;  . Abdominal hysterectomy  10/28/2013  . Esophagogastroduodenoscopy (egd) with propofol N/A 06/04/2014    Procedure: ESOPHAGOGASTRODUODENOSCOPY (EGD) WITH PROPOFOL;  Surgeon: Jeryl Columbia, MD;  Location: [facility];  Service: Endoscopy;  Laterality: N/A;   Family History  Problem Relation Age  of Onset  . Prostate cancer Father   . Coronary artery disease Maternal Grandfather    History  Substance Use Topics  . Smoking status: Current Every Day Smoker -- 0.25 packs/day for 30 years    Types: Cigarettes  . Smokeless tobacco: Never Used  . Alcohol Use: Yes     Comment: seldom   OB History    Gravida Para Term Preterm AB TAB SAB Ectopic Multiple Living   3 3 3       3      Review of Systems  Constitutional: Negative for fever and chills.  Gastrointestinal: Positive for nausea, vomiting, abdominal pain and diarrhea.  Musculoskeletal: Positive for back pain.  Skin: Negative for rash.   Psychiatric/Behavioral: Negative for confusion.  All other systems reviewed and are negative.     Allergies  Prednisone; Amoxicillin; Butrans; Nsaids; and Zofran  Home Medications   Prior to Admission medications   Medication Sig Start Date End Date Taking? Authorizing Provider  acetaminophen (TYLENOL) 325 MG tablet Take 650 mg by mouth every 6 (six) hours as needed for mild pain or moderate pain (back pain).     Historical Provider, MD  budesonide (ENTOCORT EC) 3 MG 24 hr capsule Take 3 capsules (9 mg total) by mouth daily. 07/31/14   Verlee Monte, MD  escitalopram (LEXAPRO) 20 MG tablet Take 20 mg by mouth every morning.     Historical Provider, MD  LORazepam (ATIVAN) 1 MG tablet Take 1 mg by mouth 2 (two) times daily as needed for anxiety (anxiety).     Historical Provider, MD  methocarbamol (ROBAXIN) 500 MG tablet Take 2 tablets (1,000 mg total) by mouth 4 (four) times daily. 07/23/14   Carlisle Cater, PA-C  Multiple Vitamin (MULITIVITAMIN WITH MINERALS) TABS Take 1 tablet by mouth every morning.     Historical Provider, MD  pantoprazole (PROTONIX) 40 MG tablet Take 1 tablet (40 mg total) by mouth 2 (two) times daily. 06/05/14   Charlynne Cousins, MD  potassium chloride (K-DUR,KLOR-CON) 10 MEQ tablet Take 2 tablets by mouth daily. 07/29/14   Historical Provider, MD  promethazine (PHENERGAN) 25 MG tablet Take 1 tablet (25 mg total) by mouth every 6 (six) hours as needed for nausea or vomiting. 03/10/14   Larene Pickett, PA-C   Triage Vitals: BP 136/87 mmHg  Pulse 94  Temp(Src) 98.4 F (36.9 C) (Oral)  Resp 20  Ht 5\' 3"  (1.6 m)  SpO2 100%  LMP 08/06/2013   Physical Exam  CONSTITUTIONAL: Well developed/well nourished HEAD: Normocephalic/atraumatic EYES: EOMI/PERRL ENMT: Mucous membranes moist NECK: supple no meningeal signs SPINE/BACK:entire spine nontender CV: S1/S2 noted, no murmurs/rubs/gallops noted LUNGS: Lungs are clear to auscultation bilaterally, no apparent  distress ABDOMEN: soft, nontender, no rebound or guarding, bowel sounds noted throughout abdomen GU:no cva tenderness NEURO: Pt is awake/alert/appropriate, moves all extremitiesx4.  No facial droop.   EXTREMITIES: pulses normal/equal, full ROM SKIN: warm, color normal PSYCH: no abnormalities of mood noted, alert and oriented to situation   ED Course  Procedures ( Medications  sodium chloride 0.9 % bolus 1,000 mL (0 mLs Intravenous Stopped 08/13/14 2148)  morphine 4 MG/ML injection 4 mg (4 mg Intravenous Given 08/13/14 1905)  promethazine (PHENERGAN) injection 25 mg (25 mg Intramuscular Given 08/13/14 1909)  potassium chloride 10 mEq in 100 mL IVPB (0 mEq Intravenous Stopped 08/13/14 2148)  potassium chloride SA (K-DUR,KLOR-CON) CR tablet 40 mEq (40 mEq Oral Given 08/13/14 1935)  LORazepam (ATIVAN) injection 1 mg (1 mg Intravenous  Given 08/13/14 2003)  morphine 4 MG/ML injection 4 mg (4 mg Intravenous Given 08/13/14 2113)  potassium chloride SA (K-DUR,KLOR-CON) CR tablet 40 mEq (40 mEq Oral Given 08/13/14 2112)  potassium chloride 10 mEq in 100 mL IVPB (0 mEq Intravenous Stopped 08/13/14 2337)  morphine 4 MG/ML injection 4 mg (4 mg Intravenous Given 08/13/14 2359)  sodium chloride 0.9 % bolus 500 mL (500 mLs Intravenous New Bag/Given 08/13/14 2356)    DIAGNOSTIC STUDIES: Oxygen Saturation is 100% on RA, Normal by my interpretation.    COORDINATION OF CARE: 6:34 PM- Will give Phenergan, Morphine, and fluids. Will order BMP, CBC, and EKG. Discussed treatment plan with pt at bedside and pt agreed to plan.    7:41 PM Pt now having CP Ekg/troponin ordered She is being treated for hypoKalemia 7:53 PM Repeat EKG negative 8:33 PM Pt reports CP improved Reports likely due to anxiety Now just having back pain which is similar to prior episodes for her.  No new SOB is reported She does not want to be admitted Requests additional K dosing and recheck BP 141/87 mmHg  Pulse 74  Temp(Src) 98.4 F  (36.9 C) (Oral)  Resp 17  Ht 5\' 3"  (1.6 m)  SpO2 100%  LMP 08/06/2013 12:12 AM Potassium has improved She is mildly dehydrated (no anion gap) Will give another 565ml bolus Otherwise no vomiting here She reports her back pain is c/w prior "flares of my colitis" No signs of acute abdominal emergency She feels all of her symptoms are chronic Will d/c home with close PCP followup BP 131/85 mmHg  Pulse 68  Temp(Src) 98.4 F (36.9 C) (Oral)  Resp 20  Ht 5\' 3"  (1.6 m)  SpO2 100%  LMP 08/06/2013  Labs Review Labs Reviewed  BASIC METABOLIC PANEL - Abnormal; Notable for the following:    Potassium 2.6 (*)    CO2 19 (*)    Glucose, Bld 110 (*)    All other components within normal limits  CBC WITH DIFFERENTIAL/PLATELET - Abnormal; Notable for the following:    RBC 3.37 (*)    MCV 106.8 (*)    MCH 36.2 (*)    All other components within normal limits  BASIC METABOLIC PANEL - Abnormal; Notable for the following:    Potassium 3.0 (*)    Chloride 112 (*)    CO2 18 (*)    Calcium 8.5 (*)    All other components within normal limits  TROPONIN I      EKG Interpretation   Date/Time:  Wednesday Aug 13 2014 18:37:53 EDT Ventricular Rate:  79 PR Interval:  178 QRS Duration: 86 QT Interval:  422 QTC Calculation: 483 R Axis:   41 Text Interpretation:  Normal sinus rhythm Non-specific ST-t changes  Abnormal ECG changed from prior - qt is less prolonged on this EKG  Confirmed by Christy Gentles  MD, [place] ([id number]) on 08/13/2014 6:58:00 PM      EKG Interpretation  Date/Time:  Wednesday Aug 13 2014 19:47:20 EDT Ventricular Rate:  77 PR Interval:  182 QRS Duration: 84 QT Interval:  420 QTC Calculation: 475 R Axis:   25 Text Interpretation:  Normal sinus rhythm Normal ECG Confirmed by Christy Gentles  MD, Elenore Rota ([id number]) on 08/13/2014 7:53:16 PM       MDM   Final diagnoses:  Vomiting and diarrhea  Dehydration  Hypokalemia    Nursing notes including past medical history and social  history reviewed and considered in documentation Labs/vital reviewed myself and considered during evaluation  Previous records reviewed and considered    I personally performed the services described in this documentation, which was scribed in my presence. The recorded information has been reviewed and is accurate.      Ripley Fraise, MD 08/14/14 [phone]

## 2014-08-13 NOTE — ED Notes (Signed)
Vomiting, diarrhea x 2 days-sent by PCP-was advised via phone this am lab K+ 2.6

## 2014-08-14 NOTE — ED Notes (Signed)
Patient awaiting IV fluids to finish prior to discharge per MD

## 2014-08-16 ENCOUNTER — Inpatient Hospital Stay (HOSPITAL_COMMUNITY)
Admission: EM | Admit: 2014-08-16 | Discharge: 2014-08-18 | DRG: 641 | Disposition: A | Discharge status: Home / Self Care | Payer: BLUE CROSS/BLUE SHIELD | Attending: Internal Medicine | Admitting: Internal Medicine

## 2014-08-16 ENCOUNTER — Encounter (HOSPITAL_COMMUNITY): Payer: Self-pay | Admitting: Emergency Medicine

## 2014-08-16 DIAGNOSIS — E876 Hypokalemia: Secondary | ICD-10-CM | POA: Diagnosis present

## 2014-08-16 DIAGNOSIS — M549 Dorsalgia, unspecified: Secondary | ICD-10-CM | POA: Diagnosis present

## 2014-08-16 DIAGNOSIS — Z886 Allergy status to analgesic agent status: Secondary | ICD-10-CM | POA: Diagnosis not present

## 2014-08-16 DIAGNOSIS — D539 Nutritional anemia, unspecified: Secondary | ICD-10-CM | POA: Diagnosis present

## 2014-08-16 DIAGNOSIS — K589 Irritable bowel syndrome without diarrhea: Secondary | ICD-10-CM | POA: Diagnosis present

## 2014-08-16 DIAGNOSIS — Z880 Allergy status to penicillin: Secondary | ICD-10-CM | POA: Diagnosis not present

## 2014-08-16 DIAGNOSIS — R1110 Vomiting, unspecified: Secondary | ICD-10-CM | POA: Diagnosis not present

## 2014-08-16 DIAGNOSIS — R112 Nausea with vomiting, unspecified: Secondary | ICD-10-CM | POA: Diagnosis not present

## 2014-08-16 DIAGNOSIS — I456 Pre-excitation syndrome: Secondary | ICD-10-CM | POA: Diagnosis present

## 2014-08-16 DIAGNOSIS — F419 Anxiety disorder, unspecified: Secondary | ICD-10-CM | POA: Diagnosis present

## 2014-08-16 DIAGNOSIS — Z87442 Personal history of urinary calculi: Secondary | ICD-10-CM

## 2014-08-16 DIAGNOSIS — F329 Major depressive disorder, single episode, unspecified: Secondary | ICD-10-CM | POA: Diagnosis present

## 2014-08-16 DIAGNOSIS — Z888 Allergy status to other drugs, medicaments and biological substances status: Secondary | ICD-10-CM | POA: Diagnosis not present

## 2014-08-16 DIAGNOSIS — G43A1 Cyclical vomiting, intractable: Secondary | ICD-10-CM

## 2014-08-16 DIAGNOSIS — Z8249 Family history of ischemic heart disease and other diseases of the circulatory system: Secondary | ICD-10-CM

## 2014-08-16 DIAGNOSIS — G8929 Other chronic pain: Secondary | ICD-10-CM | POA: Diagnosis present

## 2014-08-16 DIAGNOSIS — E872 Acidosis: Secondary | ICD-10-CM | POA: Diagnosis present

## 2014-08-16 DIAGNOSIS — R197 Diarrhea, unspecified: Secondary | ICD-10-CM

## 2014-08-16 DIAGNOSIS — E271 Primary adrenocortical insufficiency: Secondary | ICD-10-CM | POA: Diagnosis present

## 2014-08-16 DIAGNOSIS — F17210 Nicotine dependence, cigarettes, uncomplicated: Secondary | ICD-10-CM | POA: Diagnosis present

## 2014-08-16 DIAGNOSIS — Z79899 Other long term (current) drug therapy: Secondary | ICD-10-CM

## 2014-08-16 LAB — COMPREHENSIVE METABOLIC PANEL
ALT: 21 U/L (ref 14–54)
AST: 19 U/L (ref 15–41)
Albumin: 3.8 g/dL (ref 3.5–5.0)
Alkaline Phosphatase: 144 U/L — ABNORMAL HIGH (ref 38–126)
Anion gap: 12 (ref 5–15)
BUN: 10 mg/dL (ref 6–20)
CO2: 18 mmol/L — ABNORMAL LOW (ref 22–32)
Calcium: 8.8 mg/dL — ABNORMAL LOW (ref 8.9–10.3)
Chloride: 114 mmol/L — ABNORMAL HIGH (ref 101–111)
Creatinine, Ser: 0.59 mg/dL (ref 0.44–1.00)
GFR calc non Af Amer: >60 mL/min (ref >60)
GLUCOSE: 90 mg/dL (ref 65–99)
Potassium: 2.4 mmol/L — CL (ref 3.5–5.1)
SODIUM: 144 mmol/L (ref 135–145)
TOTAL PROTEIN: 6.7 g/dL (ref 6.5–8.1)
Total Bilirubin: 0.6 mg/dL (ref 0.3–1.2)

## 2014-08-16 LAB — CBC WITH DIFFERENTIAL/PLATELET
BASOS PCT: 0 % (ref 0–1)
Basophils Absolute: 0 10*3/uL (ref 0.0–0.1)
EOS PCT: 1 % (ref 0–5)
Eosinophils Absolute: 0.1 10*3/uL (ref 0.0–0.7)
HCT: 34.9 % — ABNORMAL LOW (ref 36.0–46.0)
Hemoglobin: 11.6 g/dL — ABNORMAL LOW (ref 12.0–15.0)
LYMPHS ABS: 1.4 10*3/uL (ref 0.7–4.0)
LYMPHS PCT: 14 % (ref 12–46)
MCH: 35.3 pg — AB (ref 26.0–34.0)
MCHC: 33.2 g/dL (ref 30.0–36.0)
MCV: 106.1 fL — ABNORMAL HIGH (ref 78.0–100.0)
MONOS PCT: 5 % (ref 3–12)
Monocytes Absolute: 0.5 10*3/uL (ref 0.1–1.0)
Neutro Abs: 8 10*3/uL — ABNORMAL HIGH (ref 1.7–7.7)
Neutrophils Relative %: 80 % — ABNORMAL HIGH (ref 43–77)
Platelets: 218 10*3/uL (ref 150–400)
RBC: 3.29 MIL/uL — ABNORMAL LOW (ref 3.87–5.11)
RDW: 14.4 % (ref 11.5–15.5)
WBC: 10 10*3/uL (ref 4.0–10.5)

## 2014-08-16 LAB — URINALYSIS, ROUTINE W REFLEX MICROSCOPIC
BILIRUBIN URINE: NEGATIVE
Glucose, UA: NEGATIVE mg/dL
Hgb urine dipstick: NEGATIVE
KETONES UR: NEGATIVE mg/dL
Leukocytes, UA: NEGATIVE
NITRITE: NEGATIVE
PH: 6 (ref 5.0–8.0)
PROTEIN: 30 mg/dL — AB
SPECIFIC GRAVITY, URINE: 1.03 (ref 1.005–1.030)
UROBILINOGEN UA: 0.2 mg/dL (ref 0.0–1.0)

## 2014-08-16 LAB — MAGNESIUM
MAGNESIUM: 1.7 mg/dL (ref 1.7–2.4)
Magnesium: 2.6 mg/dL — ABNORMAL HIGH (ref 1.7–2.4)

## 2014-08-16 LAB — VITAMIN B12: VITAMIN B 12: 171 pg/mL — AB (ref 180–914)

## 2014-08-16 LAB — URINE MICROSCOPIC-ADD ON

## 2014-08-16 LAB — LIPASE, BLOOD: Lipase: 15 U/L — ABNORMAL LOW (ref 22–51)

## 2014-08-16 LAB — PHOSPHORUS: PHOSPHORUS: 2.9 mg/dL (ref 2.5–4.6)

## 2014-08-16 MED ORDER — ONDANSETRON HCL 4 MG PO TABS: 4.0000 mg | ORAL_TABLET | Freq: Four times a day (QID) | ORAL | Status: DC | PRN

## 2014-08-16 MED ORDER — ESCITALOPRAM OXALATE 20 MG PO TABS
20.0000 mg | ORAL_TABLET | Freq: Every morning | ORAL | Status: DC
  Administered 2014-08-16 – 2014-08-18 (×3): 20 mg via ORAL
  Filled 2014-08-16 (×3): qty 1

## 2014-08-16 MED ORDER — LORAZEPAM 2 MG/ML IJ SOLN
1.0000 mg | Freq: Once | INTRAMUSCULAR | Status: AC
  Administered 2014-08-16: 1 mg via INTRAVENOUS
  Filled 2014-08-16: qty 1

## 2014-08-16 MED ORDER — LORAZEPAM 2 MG/ML IJ SOLN: 0.5000 mg | Freq: Four times a day (QID) | INTRAMUSCULAR | Status: DC | PRN

## 2014-08-16 MED ORDER — ACETAMINOPHEN 650 MG RE SUPP
650.0000 mg | Freq: Four times a day (QID) | RECTAL | Status: DC | PRN
Start: 2014-08-16 — End: 2014-08-18

## 2014-08-16 MED ORDER — MORPHINE SULFATE 2 MG/ML IJ SOLN
1.0000 mg | INTRAMUSCULAR | Status: AC | PRN
  Administered 2014-08-16 – 2014-08-17 (×4): 1 mg via INTRAVENOUS
  Filled 2014-08-16 (×4): qty 1

## 2014-08-16 MED ORDER — POTASSIUM CHLORIDE 10 MEQ/100ML IV SOLN
10.0000 meq | INTRAVENOUS | Status: DC
  Administered 2014-08-16 (×2): 10 meq via INTRAVENOUS
  Filled 2014-08-16 (×2): qty 100

## 2014-08-16 MED ORDER — BUDESONIDE 3 MG PO CPEP
9.0000 mg | ORAL_CAPSULE | Freq: Every day | ORAL | Status: DC
  Administered 2014-08-16 – 2014-08-18 (×3): 9 mg via ORAL
  Filled 2014-08-16 (×3): qty 3

## 2014-08-16 MED ORDER — PROMETHAZINE HCL 25 MG/ML IJ SOLN: 12.5000 mg | Freq: Four times a day (QID) | INTRAMUSCULAR | Status: DC | PRN

## 2014-08-16 MED ORDER — MORPHINE SULFATE 2 MG/ML IJ SOLN: 1.0000 mg | INTRAMUSCULAR | Status: DC | PRN

## 2014-08-16 MED ORDER — MAGNESIUM SULFATE 2 GM/50ML IV SOLN
2.0000 g | Freq: Once | INTRAVENOUS | Status: AC
  Administered 2014-08-16: 2 g via INTRAVENOUS
  Filled 2014-08-16: qty 50

## 2014-08-16 MED ORDER — MORPHINE SULFATE 2 MG/ML IJ SOLN
2.0000 mg | Freq: Once | INTRAMUSCULAR | Status: AC
  Administered 2014-08-16: 2 mg via INTRAVENOUS
  Filled 2014-08-16: qty 1

## 2014-08-16 MED ORDER — LORAZEPAM 2 MG/ML IJ SOLN
0.5000 mg | Freq: Four times a day (QID) | INTRAMUSCULAR | Status: AC | PRN
Start: 2014-08-16 — End: 2014-08-17
  Administered 2014-08-16 – 2014-08-17 (×3): 0.5 mg via INTRAVENOUS
  Filled 2014-08-16 (×3): qty 1

## 2014-08-16 MED ORDER — POTASSIUM CHLORIDE CRYS ER 20 MEQ PO TBCR
40.0000 meq | EXTENDED_RELEASE_TABLET | Freq: Every day | ORAL | Status: DC
  Administered 2014-08-17: 40 meq via ORAL
  Filled 2014-08-16: qty 2

## 2014-08-16 MED ORDER — SODIUM CHLORIDE 0.9 % IV SOLN
8.0000 mg | Freq: Four times a day (QID) | INTRAVENOUS | Status: DC | PRN
  Filled 2014-08-16: qty 4

## 2014-08-16 MED ORDER — POTASSIUM CHLORIDE 2 MEQ/ML IV SOLN
INTRAVENOUS | Status: DC
  Administered 2014-08-16 – 2014-08-17 (×2): via INTRAVENOUS
  Filled 2014-08-16 (×4): qty 1000

## 2014-08-16 MED ORDER — LORAZEPAM 1 MG PO TABS
1.0000 mg | ORAL_TABLET | Freq: Two times a day (BID) | ORAL | Status: DC | PRN
Start: 2014-08-16 — End: 2014-08-18

## 2014-08-16 MED ORDER — PROMETHAZINE HCL 25 MG/ML IJ SOLN
12.5000 mg | Freq: Four times a day (QID) | INTRAMUSCULAR | Status: AC | PRN
  Administered 2014-08-16 – 2014-08-17 (×3): 12.5 mg via INTRAVENOUS
  Filled 2014-08-16 (×3): qty 1

## 2014-08-16 MED ORDER — ALUM & MAG HYDROXIDE-SIMETH 200-200-20 MG/5ML PO SUSP: 30.0000 mL | Freq: Four times a day (QID) | ORAL | Status: DC | PRN

## 2014-08-16 MED ORDER — PANTOPRAZOLE SODIUM 40 MG PO TBEC
40.0000 mg | DELAYED_RELEASE_TABLET | Freq: Two times a day (BID) | ORAL | Status: DC
  Administered 2014-08-16 – 2014-08-18 (×3): 40 mg via ORAL
  Filled 2014-08-16 (×4): qty 1

## 2014-08-16 MED ORDER — ACETAMINOPHEN 325 MG PO TABS: 650.0000 mg | ORAL_TABLET | Freq: Four times a day (QID) | ORAL | Status: DC | PRN

## 2014-08-16 MED ORDER — PROMETHAZINE HCL 25 MG PO TABS: 25.0000 mg | ORAL_TABLET | Freq: Four times a day (QID) | ORAL | Status: DC | PRN

## 2014-08-16 MED ORDER — POTASSIUM CHLORIDE 10 MEQ/100ML IV SOLN: 10.0000 meq | Freq: Once | INTRAVENOUS | Status: DC

## 2014-08-16 MED ORDER — PROMETHAZINE HCL 25 MG/ML IJ SOLN
6.2500 mg | Freq: Once | INTRAMUSCULAR | Status: AC
  Administered 2014-08-16: 6.25 mg via INTRAVENOUS
  Filled 2014-08-16: qty 1

## 2014-08-16 MED ORDER — SODIUM CHLORIDE 0.9 % IJ SOLN
3.0000 mL | Freq: Two times a day (BID) | INTRAMUSCULAR | Status: DC
  Administered 2014-08-16: 3 mL via INTRAVENOUS

## 2014-08-16 MED ORDER — LISDEXAMFETAMINE DIMESYLATE 30 MG PO CAPS
30.0000 mg | ORAL_CAPSULE | Freq: Every day | ORAL | Status: DC
  Administered 2014-08-17 – 2014-08-18 (×2): 30 mg via ORAL
  Filled 2014-08-16 (×2): qty 1

## 2014-08-16 NOTE — ED Provider Notes (Signed)
CSN: [id number]     Arrival date & time 08/16/14  1019 History   First MD Initiated Contact with Patient 08/16/14 1021     Chief Complaint  Patient presents with  . Dizziness  . Emesis  . Abdominal Pain    HPI   49 year old female a history of collagenous colitis presents today with 4 day history nausea vomiting and hypokalemia. Patient reports a significant past medical history of colitis with recurrent flares. She reports this episode has lasted 4 days and includes  right quadrant pain with no radiation of symptoms, and a "tingling" sensation in her feet. She reports these have been present since initial flare. Patient patient per she was seen in the ED 2 days ago for this episode after she was found to have a potassium 2.6 at her doctor's office. Patient reports she was seen and treated with inpatient Hospital medical on 07/30/2014 for similar presentation. Patient reports that she was given IV potassium 2 days prior with oral after intravenous antiemetics were on board. She reports since then she has been unable to tolerate by mouth potassium, with continued nausea vomiting and diarrhea. Patient denies headache, chest pain, shortness of breath, bloody stools or vomit, lower extremity swelling or edema. Patient reports her last colonoscopy was on 08/24/2012 that showed normal ileum and normal mucus throughout colon.  Patient also had an upper endoscopy on 06/04/2014 no significant findings. C-Diff cultures from 06/03/14 negative.   Past Medical History  Diagnosis Date  . Collagenous colitis   . WPW (Wolff-Parkinson-White syndrome)   . Addison disease   . Thyroid disease   . Clostridium difficile diarrhea   . Anxiety and depression   . Chronic diarrhea   . Chronic back pain   . Drug-seeking behavior   . Irritable bowel syndrome (IBS)   . Anemia   . Pancreatitis   . Kidney stone   . Kidney stone   . Depression   . Anxiety   . Vaginal delivery [id number], 1998  . Hypokalemia    Past  Surgical History  Procedure Laterality Date  . Cholecystectomy    . Tubal ligation    . Lithotripsy    . Sphincterotomy    . Cardiac electrophysiology mapping and ablation      for WPW  . Video bronchoscopy  08/31/2011    Procedure: VIDEO BRONCHOSCOPY WITH FLUORO;  Surgeon: Elsie Stain, MD;  Location: [facility];  Service: Cardiopulmonary;  Laterality: N/A;  . Vaginal hysterectomy  10/28/2013    Procedure: HYSTERECTOMY VAGINAL;  Surgeon: Emily Filbert, MD;  Location: [facility];  Service: Gynecology;;  . Salpingoophorectomy Bilateral 10/28/2013    Procedure: SALPINGO OOPHORECTOMY;  Surgeon: Emily Filbert, MD;  Location: [facility];  Service: Gynecology;  Laterality: Bilateral;  . Abdominal hysterectomy  10/28/2013  . Esophagogastroduodenoscopy (egd) with propofol N/A 06/04/2014    Procedure: ESOPHAGOGASTRODUODENOSCOPY (EGD) WITH PROPOFOL;  Surgeon: Jeryl Columbia, MD;  Location: [facility];  Service: Endoscopy;  Laterality: N/A;   Family History  Problem Relation Age of Onset  . Prostate cancer Father   . Coronary artery disease Maternal Grandfather    History  Substance Use Topics  . Smoking status: Current Every Day Smoker -- 0.25 packs/day for 30 years    Types: Cigarettes  . Smokeless tobacco: Never Used  . Alcohol Use: Yes     Comment: seldom   OB History    Gravida Para Term Preterm AB TAB SAB Ectopic Multiple Living   3 3  3       3     Review of Systems  All other systems reviewed and are negative.  Allergies  Prednisone; Amoxicillin; Butrans; Nsaids; and Zofran  Home Medications   Prior to Admission medications   Medication Sig Start Date End Date Taking? Authorizing Provider  acetaminophen (TYLENOL) 325 MG tablet Take 650 mg by mouth every 6 (six) hours as needed for mild pain or moderate pain (back pain).    Yes Historical Provider, MD  budesonide (ENTOCORT EC) 3 MG 24 hr capsule Take 3 capsules (9 mg total) by mouth daily. 07/31/14  Yes Verlee Monte, MD  escitalopram  (LEXAPRO) 20 MG tablet Take 20 mg by mouth every morning.    Yes Historical Provider, MD  lisdexamfetamine (VYVANSE) 30 MG capsule Take 30 mg by mouth daily.  08/04/14 09/03/14 Yes Historical Provider, MD  LORazepam (ATIVAN) 1 MG tablet Take 1 mg by mouth 2 (two) times daily as needed for anxiety (anxiety).    Yes Historical Provider, MD  Multiple Vitamin (MULITIVITAMIN WITH MINERALS) TABS Take 1 tablet by mouth every morning.    Yes Historical Provider, MD  pantoprazole (PROTONIX) 40 MG tablet Take 1 tablet (40 mg total) by mouth 2 (two) times daily. 06/05/14  Yes Charlynne Cousins, MD  potassium chloride (K-DUR,KLOR-CON) 10 MEQ tablet Take 2 tablets by mouth daily. 07/29/14  Yes Historical Provider, MD  promethazine (PHENERGAN) 25 MG tablet Take 1 tablet (25 mg total) by mouth every 6 (six) hours as needed for nausea or vomiting. 03/10/14  Yes Larene Pickett, PA-C  methocarbamol (ROBAXIN) 500 MG tablet Take 2 tablets (1,000 mg total) by mouth 4 (four) times daily. Patient not taking: Reported on 08/16/2014 07/23/14   Carlisle Cater, PA-C   LMP 08/06/2013 Physical Exam  Constitutional: She is oriented to person, place, and time. She appears well-developed and well-nourished.  HENT:  Head: Normocephalic and atraumatic.  Eyes: Pupils are equal, round, and reactive to light.  Neck: Normal range of motion. Neck supple. No JVD present. No tracheal deviation present. No thyromegaly present.  Cardiovascular: Normal rate, regular rhythm, normal heart sounds and intact distal pulses.  Exam reveals no gallop and no friction rub.   No murmur heard. Pulmonary/Chest: Effort normal and breath sounds normal. No stridor. No respiratory distress. She has no wheezes. She has no rales. She exhibits no tenderness.  Abdominal: Soft. There is no splenomegaly or hepatomegaly. There is no rigidity, no rebound, no guarding, no CVA tenderness, no tenderness at McBurney's point and negative Murphy's sign.  Mild right quadrant  tenderness palpation, no mass, signs of trauma.   Musculoskeletal: Normal range of motion.  Lymphadenopathy:    She has no cervical adenopathy.  Neurological: She is alert and oriented to person, place, and time. Coordination normal.  Skin: Skin is warm and dry.  Psychiatric: She has a normal mood and affect. Her behavior is normal. Judgment and thought content normal.  Nursing note and vitals reviewed.   ED Course  Procedures (including critical care time) Labs Review Labs Reviewed  CBC WITH DIFFERENTIAL/PLATELET  COMPREHENSIVE METABOLIC PANEL  LIPASE, BLOOD  URINALYSIS, ROUTINE W REFLEX MICROSCOPIC    Imaging Review No results found.   EKG Interpretation None     MDM   Final diagnoses:  Hypokalemia, gastrointestinal losses  Nausea, vomiting and diarrhea     Labs: Urinalysis, CBC, CMP, lipase, urinalysis- significant for hypokalemia and hypocalcemia  Imaging: None indicated  Consults: Hospitalistist  Therapeutics: Ativan, potassium, morphine,  Phenergan  Assessment: Hypokalemia, nausea vomiting and diarrhea likely secondary to collagenous colitis flare  Plan: Patient presented with hypokalemia nausea vomiting and diarrhea. Presentation is identical to previous episodes. Recent hospital admission April 2016 for the same, seen in the ED 2 days prior for hypokalemia given potassium and discharged home. She continued to have difficulty holding down oral potassium, having diarrhea, and continued abdominal pain. Presentation vital signs are normal, significant for hypokalemia, she was treated in the ED with pain medication and antinausea given potassium. Hospitalist service was consult at for potassium replacement and management of nausea vomiting diarrhea. Patient care was transferred to hospital service.      Okey Regal, PA-C 08/16/14 1533  Blanchie Dessert, MD 08/16/14 1534

## 2014-08-16 NOTE — ED Notes (Signed)
Nurse starting IV 

## 2014-08-16 NOTE — H&P (Signed)
[facility] History and Physical  Dawn Frost XAJ:[id number] DOB: 07/19/1965 DOA: 08/16/2014  Referring physician: Dr. Tomi Bamberger PCP: Ardith Dark, PA-C   Chief Complaint: Intractable nausea and vomiting with abdominal pain  HPI: Dawn Frost is a 49 y.o. female with past medical history of collagenous colitis on Entocort recently discharged from the hospital on 07/31/2014 that comes in for intractable nausea vomiting for the last 4 days. She relates she's been trying to take her Phenergan home but she continues to throw up. She has not taken her Entocort over these last few days or at least she thinks she has not kept it down, right flank pain but no rashes. She denies any fever, cough, burning when she urinates, chest pain.  She saw her primary care doctor a week prior to admission her potassium was 2.4 she was into the urgent care her potassium came up after IV potassium and oral potassium at urgent care and was discharged home.  In the ED: Basic metabolic panel was done that shows a potassium of 2.4 a bicarbonate of 18 her lipase is mildly elevated no leukocytosis. EKG shows a prolonged QTC so we are consulted for further evaluation.  Review of Systems:  Constitutional:  No weight loss, night sweats, Fevers, chills, fatigue.  HEENT:  No headaches, Difficulty swallowing,Tooth/dental problems,Sore throat,  No sneezing, itching, ear ache, nasal congestion, post nasal drip,  Cardio-vascular:  No chest pain, Orthopnea, PND, swelling in lower extremities, anasarca, dizziness, palpitations  GI:  No heartburn, indigestion, abdominal pain, nausea, vomiting, diarrhea, change in bowel habits, loss of appetite  Resp:  No shortness of breath with exertion or at rest. No excess mucus, no productive cough, No non-productive cough, No coughing up of blood.No change in color of mucus.No wheezing.No chest wall deformity  Skin:  no rash or lesions.  GU:  no dysuria, change in color of  urine, no urgency or frequency. No flank pain.  Musculoskeletal:  No joint pain or swelling. No decreased range of motion. No back pain.  Psych:  No change in mood or affect. No depression or anxiety. No memory loss.   Past Medical History  Diagnosis Date  . Collagenous colitis   . WPW (Wolff-Parkinson-White syndrome)   . Addison disease   . Thyroid disease   . Clostridium difficile diarrhea   . Anxiety and depression   . Chronic diarrhea   . Chronic back pain   . Drug-seeking behavior   . Irritable bowel syndrome (IBS)   . Anemia   . Pancreatitis   . Kidney stone   . Kidney stone   . Depression   . Anxiety   . Vaginal delivery [id number], 1998  . Hypokalemia    Past Surgical History  Procedure Laterality Date  . Cholecystectomy    . Tubal ligation    . Lithotripsy    . Sphincterotomy    . Cardiac electrophysiology mapping and ablation      for WPW  . Video bronchoscopy  08/31/2011    Procedure: VIDEO BRONCHOSCOPY WITH FLUORO;  Surgeon: Elsie Stain, MD;  Location: [facility];  Service: Cardiopulmonary;  Laterality: N/A;  . Vaginal hysterectomy  10/28/2013    Procedure: HYSTERECTOMY VAGINAL;  Surgeon: Emily Filbert, MD;  Location: [facility];  Service: Gynecology;;  . Salpingoophorectomy Bilateral 10/28/2013    Procedure: SALPINGO OOPHORECTOMY;  Surgeon: Emily Filbert, MD;  Location: [facility];  Service: Gynecology;  Laterality: Bilateral;  . Abdominal hysterectomy  10/28/2013  . Esophagogastroduodenoscopy (egd) with propofol N/A  06/04/2014    Procedure: ESOPHAGOGASTRODUODENOSCOPY (EGD) WITH PROPOFOL;  Surgeon: Jeryl Columbia, MD;  Location: [facility];  Service: Endoscopy;  Laterality: N/A;   Social History:  reports that she has been smoking Cigarettes.  She has a 7.5 pack-year smoking history. She has never used smokeless tobacco. She reports that she drinks alcohol. She reports that she does not use illicit drugs.  Allergies  Allergen Reactions  . Prednisone Other (See  Comments)    Hyperactivity and agitation  . Amoxicillin     Pt states she had a bad reaction to amox  . Butrans [Buprenorphine] Other (See Comments)    Burned skin  . Nsaids Other (See Comments)    Bad for colitis  . Zofran Other (See Comments)    Headache     Family History  Problem Relation Age of Onset  . Prostate cancer Father   . Coronary artery disease Maternal Grandfather     Prior to Admission medications   Medication Sig Start Date End Date Taking? Authorizing Provider  acetaminophen (TYLENOL) 325 MG tablet Take 650 mg by mouth every 6 (six) hours as needed for mild pain or moderate pain (back pain).    Yes Historical Provider, MD  budesonide (ENTOCORT EC) 3 MG 24 hr capsule Take 3 capsules (9 mg total) by mouth daily. 07/31/14  Yes Verlee Monte, MD  escitalopram (LEXAPRO) 20 MG tablet Take 20 mg by mouth every morning.    Yes Historical Provider, MD  lisdexamfetamine (VYVANSE) 30 MG capsule Take 30 mg by mouth daily.  08/04/14 09/03/14 Yes Historical Provider, MD  LORazepam (ATIVAN) 1 MG tablet Take 1 mg by mouth 2 (two) times daily as needed for anxiety (anxiety).    Yes Historical Provider, MD  Multiple Vitamin (MULITIVITAMIN WITH MINERALS) TABS Take 1 tablet by mouth every morning.    Yes Historical Provider, MD  pantoprazole (PROTONIX) 40 MG tablet Take 1 tablet (40 mg total) by mouth 2 (two) times daily. 06/05/14  Yes Charlynne Cousins, MD  potassium chloride (K-DUR,KLOR-CON) 10 MEQ tablet Take 2 tablets by mouth daily. 07/29/14  Yes Historical Provider, MD  promethazine (PHENERGAN) 25 MG tablet Take 1 tablet (25 mg total) by mouth every 6 (six) hours as needed for nausea or vomiting. 03/10/14  Yes Larene Pickett, PA-C  methocarbamol (ROBAXIN) 500 MG tablet Take 2 tablets (1,000 mg total) by mouth 4 (four) times daily. Patient not taking: Reported on 08/16/2014 07/23/14   Carlisle Cater, PA-C   Physical Exam: There were no vitals filed for this visit.  Wt Readings from Last 3  Encounters:  07/29/14 48.2 kg (106 lb 4.2 oz)  06/05/14 58.832 kg (129 lb 11.2 oz)  03/15/14 57.153 kg (126 lb)    General:  Appears calm and comfortable Eyes: PERRL, normal lids, irises & conjunctiva ENT: Mucosal membranes are dry. Neck: no LAD, masses or thyromegaly Cardiovascular: RRR, no m/r/g. No LE edema. Telemetry: SR, no arrhythmias  Respiratory: CTA bilaterally, no w/r/r. Normal respiratory effort. Abdomen: Her abdomen is soft with some right flank pain no rebound or guarding no rashes or ulceration Skin: She has no rashes. Musculoskeletal: grossly normal tone BUE/BLE Psychiatric: grossly normal mood and affect, speech fluent and appropriate Neurologic: grossly non-focal.          Labs on Admission:  Basic Metabolic Panel:  Recent Labs Lab 08/13/14 1830 08/13/14 2323 08/16/14 1142  NA 140 141 144  K 2.6* 3.0* 2.4*  CL 109 112* 114*  CO2 19*  18* 18*  GLUCOSE 110* 86 90  BUN 14 11 10   CREATININE 0.85 0.63 0.59  CALCIUM 9.3 8.5* 8.8*   Liver Function Tests:  Recent Labs Lab 08/16/14 1142  AST 19  ALT 21  ALKPHOS 144*  BILITOT 0.6  PROT 6.7  ALBUMIN 3.8    Recent Labs Lab 08/16/14 1142  LIPASE 15*   No results for input(s): AMMONIA in the last 168 hours. CBC:  Recent Labs Lab 08/13/14 1830 08/16/14 1142  WBC 10.2 10.0  NEUTROABS 7.3 8.0*  HGB 12.2 11.6*  HCT 36.0 34.9*  MCV 106.8* 106.1*  PLT 231 218   Cardiac Enzymes:  Recent Labs Lab 08/13/14 1830  TROPONINI <0.03    BNP (last 3 results)  Recent Labs  04/10/14 1025  BNP 51.4    ProBNP (last 3 results)  Recent Labs  03/10/14 1201  PROBNP 227.7*    CBG: No results for input(s): GLUCAP in the last 168 hours.  Radiological Exams on Admission: No results found.  EKG: Independently reviewed. Sinus rhythm with prolonged QT 500, and U waves.  Assessment/Plan Hypokalemia, gastrointestinal losses: - Multifactorial due to a gastrointestinal losses and prerenal  etiology. - I'll go ahead and start on aggressive IV fluids with half-normal saline with potassium. I will also check a mag which is probably contributing to her low potassium level. - 2 g of mag iv and recheck in the morning. - Check a phosphorus level.    Intractable nausea and vomiting - Question due to noncompliance, she's been 4 days without her Entocort. - I will go ahead and start on Zofran continue Phenergan, repeat an EKG tomorrow morning. - And see if she tolerates her diet.  Macrocytic anemia: Most likely due vitamin deficiency which is a B-12 and RBC folate.  Mild elevation of phosphatase: - Is likely due to vomiting. Check a hepatic function panel. She has no right upper quadrant pain. - Previous abdominal CT scan reviewed she has no gallbladder.  Right medial lobe scarring: - CT on 06/03/2014, the recommendations are to repeat a CT scan in a year.   Code Status: full DVT Prophylaxis:scd's Family Communication: none Disposition Plan: inpatient  Time spent: 45 min  Charlynne Cousins [facility] Pager [phone]

## 2014-08-16 NOTE — ED Notes (Signed)
Pt requesting pain medications, PA notified.

## 2014-08-16 NOTE — ED Notes (Signed)
Patient is a hard stick unable to get blood

## 2014-08-16 NOTE — ED Notes (Signed)
Notified pharmacy to send phenergan, per Merrilee Seashore since the tube system is not working "it won't be any time soon that we can bring it up to you", so we'll send one of out staff to pick up medications to prevent delay in pt's care.

## 2014-08-16 NOTE — ED Notes (Signed)
Spoke with Diane, 4th floor, will be moved from original assignment. Allowing 25 minutes.

## 2014-08-16 NOTE — ED Notes (Signed)
Patient with Hx of cholitis and hypopotassemia c/o dizziness, emesis, diarrehea. Pt states the emesis and diarrhea seem to be from her colitis, pt states that her potassium stays low, was recently 2.8, and that she cannot keep pills down between emesis and diarrhea.

## 2014-08-17 DIAGNOSIS — R1110 Vomiting, unspecified: Secondary | ICD-10-CM

## 2014-08-17 LAB — CBC
HCT: 32.8 % — ABNORMAL LOW (ref 36.0–46.0)
Hemoglobin: 11.2 g/dL — ABNORMAL LOW (ref 12.0–15.0)
MCH: 35.9 pg — ABNORMAL HIGH (ref 26.0–34.0)
MCHC: 34.1 g/dL (ref 30.0–36.0)
MCV: 105.1 fL — AB (ref 78.0–100.0)
PLATELETS: 201 10*3/uL (ref 150–400)
RBC: 3.12 MIL/uL — ABNORMAL LOW (ref 3.87–5.11)
RDW: 14.5 % (ref 11.5–15.5)
WBC: 5.2 10*3/uL (ref 4.0–10.5)

## 2014-08-17 LAB — BASIC METABOLIC PANEL
ANION GAP: 11 (ref 5–15)
BUN: 6 mg/dL (ref 6–20)
CHLORIDE: 114 mmol/L — AB (ref 101–111)
CO2: 17 mmol/L — ABNORMAL LOW (ref 22–32)
CREATININE: 0.47 mg/dL (ref 0.44–1.00)
Calcium: 8.4 mg/dL — ABNORMAL LOW (ref 8.9–10.3)
GFR calc Af Amer: >60 mL/min (ref >60)
GLUCOSE: 100 mg/dL — AB (ref 65–99)
Potassium: 3.3 mmol/L — ABNORMAL LOW (ref 3.5–5.1)
SODIUM: 142 mmol/L (ref 135–145)

## 2014-08-17 MED ORDER — MORPHINE SULFATE 2 MG/ML IJ SOLN
1.0000 mg | INTRAMUSCULAR | Status: DC | PRN
  Administered 2014-08-17 – 2014-08-18 (×3): 1 mg via INTRAVENOUS
  Filled 2014-08-17 (×3): qty 1

## 2014-08-17 MED ORDER — PROMETHAZINE HCL 25 MG/ML IJ SOLN
12.5000 mg | Freq: Four times a day (QID) | INTRAMUSCULAR | Status: DC | PRN
  Administered 2014-08-17 – 2014-08-18 (×2): 12.5 mg via INTRAVENOUS
  Filled 2014-08-17 (×2): qty 1

## 2014-08-17 MED ORDER — LOPERAMIDE HCL 2 MG PO CAPS
2.0000 mg | ORAL_CAPSULE | ORAL | Status: DC | PRN
  Administered 2014-08-17: 2 mg via ORAL
  Filled 2014-08-17: qty 1

## 2014-08-17 MED ORDER — SODIUM BICARBONATE 8.4 % IV SOLN
INTRAVENOUS | Status: DC
  Administered 2014-08-17 (×2): via INTRAVENOUS
  Filled 2014-08-17 (×4): qty 1000

## 2014-08-17 NOTE — Progress Notes (Signed)
[facility] PROGRESS NOTE  Assessment/Plan: Hypokalemia, gastrointestinal losses: - Multifactorial due to a gastrointestinal losses and prerenal etiology. - Cont to improve, will cont oral repletion.  Metabolic acidosis due GI loses: - start D5 with HCO. - b-me tin am due GI losses. - start imodium.   Intractable nausea and vomiting - Improved will like to try diet - And see if she tolerates her diet.  Macrocytic anemia: - pending B-12 and RBC folate.  Mild elevation of phosphatase: - Is likely due to vomiting. Check a hepatic function panel. She has no right upper quadrant pain. - Previous abdominal CT scan reviewed she has no gallbladder.  Right medial lobe scarring: - CT on 06/03/2014, the recommendations are to repeat a CT scan in a year.   Code Status: full DVT Prophylaxis:scd's Family Communication: none Disposition Plan: inpatient   Consultants:  none  Procedures:  none  Antibiotics:  none (indicate start date, and stop date if known)  HPI/Subjective: Relates her pain is better, no vomiting this am, multiple BM overnight. Will like to try a diet.  Objective: Filed Vitals:   08/16/14 1430 08/16/14 1516 08/16/14 2123 08/17/14 0516  BP: 130/84 146/88 157/88 142/89  Pulse: 70 62 65 63  Temp:  97.8 F (36.6 C) 97.6 F (36.4 C) 97.7 F (36.5 C)  TempSrc:  Oral Oral Oral  Resp: 17 18 18 16   Height:  5\' 3"  (1.6 m)    Weight:  55.566 kg (122 lb 8 oz)    SpO2: 100% 100% 100% 99%   No intake or output data in the 24 hours ending 08/17/14 0932 Filed Weights   08/16/14 1516  Weight: 55.566 kg (122 lb 8 oz)    Exam:  General: Alert, awake, oriented x3, in no acute distress.  HEENT: No bruits, no goiter.  Heart: Regular rate and rhythm. Lungs: Good air movement, clear Abdomen: Soft, nontender, nondistended, positive bowel sounds.  Neuro: Grossly intact, nonfocal.   Data Reviewed: Basic Metabolic Panel:  Recent Labs Lab  08/13/14 1830 08/13/14 2323 08/16/14 1142 08/16/14 1147 08/16/14 1549 08/17/14 0628  NA 140 141 144  --   --  142  K 2.6* 3.0* 2.4*  --   --  3.3*  CL 109 112* 114*  --   --  114*  CO2 19* 18* 18*  --   --  17*  GLUCOSE 110* 86 90  --   --  100*  BUN 14 11 10   --   --  6  CREATININE 0.85 0.63 0.59  --   --  0.47  CALCIUM 9.3 8.5* 8.8*  --   --  8.4*  MG  --   --   --  1.7 2.6*  --   PHOS  --   --   --   --  2.9  --    Liver Function Tests:  Recent Labs Lab 08/16/14 1142  AST 19  ALT 21  ALKPHOS 144*  BILITOT 0.6  PROT 6.7  ALBUMIN 3.8    Recent Labs Lab 08/16/14 1142  LIPASE 15*   No results for input(s): AMMONIA in the last 168 hours. CBC:  Recent Labs Lab 08/13/14 1830 08/16/14 1142 08/17/14 0628  WBC 10.2 10.0 5.2  NEUTROABS 7.3 8.0*  --   HGB 12.2 11.6* 11.2*  HCT 36.0 34.9* 32.8*  MCV 106.8* 106.1* 105.1*  PLT 231 218 201   Cardiac Enzymes:  Recent Labs Lab 08/13/14 1830  TROPONINI <0.03  BNP (last 3 results)  Recent Labs  04/10/14 1025  BNP 51.4    ProBNP (last 3 results)  Recent Labs  03/10/14 1201  PROBNP 227.7*    CBG: No results for input(s): GLUCAP in the last 168 hours.  No results found for this or any previous visit (from the past 240 hour(s)).   Studies: No results found.  Scheduled Meds: . budesonide  9 mg Oral Daily  . escitalopram  20 mg Oral q morning - 10a  . lisdexamfetamine  30 mg Oral Daily  . pantoprazole  40 mg Oral BID  . potassium chloride  40 mEq Oral Daily  . sodium chloride  3 mL Intravenous Q12H   Continuous Infusions: . dextrose 5 % 1,000 mL with sodium bicarbonate 150 mEq infusion      Time Spent: 25 min   Charlynne Cousins  [facility] Pager [phone]. If 7PM-7AM, please contact night-coverage at [email or web address], password [facility] 08/17/2014, 9:32 AM  LOS: 1 day

## 2014-08-18 LAB — FOLATE RBC
FOLATE, HEMOLYSATE: 252.5 ng/mL
FOLATE, RBC: 763 ng/mL (ref >498)
Hematocrit: 33.1 % — ABNORMAL LOW (ref 34.0–46.6)

## 2014-08-18 LAB — BASIC METABOLIC PANEL
ANION GAP: 10 (ref 5–15)
BUN: 11 mg/dL (ref 6–20)
CALCIUM: 8.2 mg/dL — AB (ref 8.9–10.3)
CO2: 26 mmol/L (ref 22–32)
Chloride: 107 mmol/L (ref 101–111)
Creatinine, Ser: 0.51 mg/dL (ref 0.44–1.00)
GLUCOSE: 102 mg/dL — AB (ref 65–99)
Potassium: 3.1 mmol/L — ABNORMAL LOW (ref 3.5–5.1)
Sodium: 143 mmol/L (ref 135–145)

## 2014-08-18 MED ORDER — CYANOCOBALAMIN 1000 MCG/ML IJ SOLN: 1000.0000 ug | Freq: Once | INTRAMUSCULAR | Status: DC

## 2014-08-18 MED ORDER — PROMETHAZINE HCL 25 MG PO TABS: 25.0000 mg | ORAL_TABLET | Freq: Four times a day (QID) | ORAL | Status: DC | PRN

## 2014-08-18 MED ORDER — CYANOCOBALAMIN 1000 MCG/ML IJ SOLN
1000.0000 ug | Freq: Once | INTRAMUSCULAR | Status: AC
  Administered 2014-08-18: 1000 ug via INTRAMUSCULAR
  Filled 2014-08-18: qty 1

## 2014-08-18 MED ORDER — POTASSIUM CHLORIDE CRYS ER 20 MEQ PO TBCR
40.0000 meq | EXTENDED_RELEASE_TABLET | Freq: Two times a day (BID) | ORAL | Status: DC
  Administered 2014-08-18: 40 meq via ORAL
  Filled 2014-08-18: qty 2

## 2014-08-18 MED ORDER — HYDROCODONE-ACETAMINOPHEN 5-325 MG PO TABS: 1.0000 | ORAL_TABLET | Freq: Four times a day (QID) | ORAL | Status: DC | PRN

## 2014-08-18 MED ORDER — LOPERAMIDE HCL 2 MG PO CAPS: 2.0000 mg | ORAL_CAPSULE | ORAL | Status: DC | PRN

## 2014-08-18 MED ORDER — POTASSIUM CHLORIDE CRYS ER 20 MEQ PO TBCR: 40.0000 meq | EXTENDED_RELEASE_TABLET | Freq: Two times a day (BID) | ORAL | Status: DC

## 2014-08-18 MED ORDER — POTASSIUM CHLORIDE CRYS ER 20 MEQ PO TBCR
40.0000 meq | EXTENDED_RELEASE_TABLET | Freq: Two times a day (BID) | ORAL | Status: DC
Start: 2014-08-19 — End: 2014-08-18
  Administered 2014-08-18: 40 meq via ORAL
  Filled 2014-08-18: qty 2

## 2014-08-18 NOTE — Discharge Summary (Signed)
Physician Discharge Summary  Dawn Frost IFO:[id number] DOB: 03-24-1966 DOA: 08/16/2014  PCP: Ardith Dark, PA-C  Admit date: 08/16/2014 Discharge date: 08/18/2014  Time spent: 35 minutes  Recommendations for Outpatient Follow-up:  1. Follow up with GI as an outpatient in 2 weeks.  Discharge Diagnoses:  Principal Problem:   Hypokalemia Active Problems:   Hypokalemia, gastrointestinal losses   Intractable nausea and vomiting   Macrocytic anemia   Discharge Condition: stable  Diet recommendation: regular  Filed Weights   08/16/14 1516  Weight: 55.566 kg (122 lb 8 oz)    History of present illness:  49 y.o. female with past medical history of collagenous colitis on Entocort recently discharged from the hospital on 07/31/2014 that comes in for intractable nausea vomiting for the last 4 days. She relates she's been trying to take her Phenergan home but she continues to throw up. She has not taken her Entocort over these last few days or at least she thinks she has not kept it down, right flank pain but no rashes. She denies any fever, cough, burning when she urinates, chest pain.   Hospital Course:  Hypokalemia, gastrointestinal losses: - Multifactorial due to a gastrointestinal losses and prerenal etiology. - It was replete IV and orally and it came up to 3.1. - will follow up with PCP in 1 week and check a b-met. - home KCL BID 40 mEq.  Intractable nausea and vomiting - Question due to noncompliance, she's been 4 days without her Entocort. - start on phenergan and entocort, was able to tolerate her diet.  Metabolic acidosis due GI loses: - due to GI loses start on D5 HCO and loperamide. - metabolic acidosis resolved.   Macrocytic anemia: - Low B-12, was start on B12 intramuscular injections. - will cont B12 injection daily for 7 days.  Mild elevation of Alk phosphatase: - Is likely due to vomiting. Hepatic function panel showed a barely elevated lipase,  AST/ALT with in normal. - Previous abdominal CT scan reviewed she has no gallbladder.  Right medial lobe scarring: - CT on 06/03/2014, the recommendations are to repeat a CT scan in a year.   Procedures:  none  Consultations:  none  Discharge Exam: Filed Vitals:   08/18/14 0600  BP: 134/78  Pulse: 55  Temp: 98.1 F (36.7 C)  Resp: 18    General: A&O x3 Cardiovascular: RRR Respiratory: good air movement CTA B/L  Discharge Instructions   Discharge Instructions    Diet - low sodium heart healthy    Complete by:  As directed      Increase activity slowly    Complete by:  As directed           Current Discharge Medication List    START taking these medications   Details  cyanocobalamin (,VITAMIN B-12,) 1000 MCG/ML injection Inject 1 mL (1,000 mcg total) into the muscle once. Qty: 7 mL, Refills: 0    loperamide (IMODIUM) 2 MG capsule Take 1 capsule (2 mg total) by mouth as needed for diarrhea or loose stools. Qty: 30 capsule, Refills: 0      CONTINUE these medications which have CHANGED   Details  potassium chloride SA (K-DUR,KLOR-CON) 20 MEQ tablet Take 2 tablets (40 mEq total) by mouth 2 (two) times daily. Qty: 60 tablet, Refills: 0    promethazine (PHENERGAN) 25 MG tablet Take 1 tablet (25 mg total) by mouth every 6 (six) hours as needed for nausea or vomiting. Qty: 12 tablet, Refills: 0  CONTINUE these medications which have NOT CHANGED   Details  acetaminophen (TYLENOL) 325 MG tablet Take 650 mg by mouth every 6 (six) hours as needed for mild pain or moderate pain (back pain).     budesonide (ENTOCORT EC) 3 MG 24 hr capsule Take 3 capsules (9 mg total) by mouth daily.    escitalopram (LEXAPRO) 20 MG tablet Take 20 mg by mouth every morning.    Associated Diagnoses: Postmenopausal bleeding    lisdexamfetamine (VYVANSE) 30 MG capsule Take 30 mg by mouth daily.     LORazepam (ATIVAN) 1 MG tablet Take 1 mg by mouth 2 (two) times daily as needed for  anxiety (anxiety).     Multiple Vitamin (MULITIVITAMIN WITH MINERALS) TABS Take 1 tablet by mouth every morning.     pantoprazole (PROTONIX) 40 MG tablet Take 1 tablet (40 mg total) by mouth 2 (two) times daily. Qty: 60 tablet, Refills: 3    methocarbamol (ROBAXIN) 500 MG tablet Take 2 tablets (1,000 mg total) by mouth 4 (four) times daily. Qty: 20 tablet, Refills: 0       Allergies  Allergen Reactions  . Prednisone Other (See Comments)    Hyperactivity and agitation  . Amoxicillin     Pt states she had a bad reaction to amox  . Butrans [Buprenorphine] Other (See Comments)    Burned skin  . Nsaids Other (See Comments)    Bad for colitis  . Zofran Other (See Comments)    Headache       The results of significant diagnostics from this hospitalization (including imaging, microbiology, ancillary and laboratory) are listed below for reference.    Significant Diagnostic Studies: Dg Lumbar Spine Complete  07/23/2014   CLINICAL DATA:  49 year old female new with left buttock and coccyx pain after being thrown from a horse 1 day previously  EXAM: LUMBAR SPINE - COMPLETE 4+ VIEW  COMPARISON:  Concurrently obtained radiographs of the sacrum and coccyx; prior CT abdomen and pelvis 04/10/2014  FINDINGS: There is no evidence of lumbar spine fracture. Alignment is normal. Intervertebral disc spaces are maintained.  IMPRESSION: Negative.   Electronically Signed   By: Jacqulynn Cadet M.D.   On: 07/23/2014 19:29   Dg Sacrum/coccyx  07/23/2014   CLINICAL DATA:  Back pain following being thrown from a horse 1 day ago, initial encounter  EXAM: SACRUM AND COCCYX - 2+ VIEW  COMPARISON:  04/10/2014  FINDINGS: The pelvic ring is intact. The sacrum appears intact. Anterior orientation of the coccyx is noted but stable from a previous exam.  IMPRESSION: No acute abnormality is noted.   Electronically Signed   By: Inez Catalina M.D.   On: 07/23/2014 19:31    Microbiology: No results found for this or  any previous visit (from the past 240 hour(s)).   Labs: Basic Metabolic Panel:  Recent Labs Lab 08/13/14 1830 08/13/14 2323 08/16/14 1142 08/16/14 1147 08/16/14 1549 08/17/14 0628 08/18/14 0536  NA 140 141 144  --   --  142 143  K 2.6* 3.0* 2.4*  --   --  3.3* 3.1*  CL 109 112* 114*  --   --  114* 107  CO2 19* 18* 18*  --   --  17* 26  GLUCOSE 110* 86 90  --   --  100* 102*  BUN '14 11 10  ' --   --  6 11  CREATININE 0.85 0.63 0.59  --   --  0.47 0.51  CALCIUM 9.3 8.5* 8.8*  --   --  8.4* 8.2*  MG  --   --   --  1.7 2.6*  --   --   PHOS  --   --   --   --  2.9  --   --    Liver Function Tests:  Recent Labs Lab 08/16/14 1142  AST 19  ALT 21  ALKPHOS 144*  BILITOT 0.6  PROT 6.7  ALBUMIN 3.8    Recent Labs Lab 08/16/14 1142  LIPASE 15*   No results for input(s): AMMONIA in the last 168 hours. CBC:  Recent Labs Lab 08/13/14 1830 08/16/14 1142 08/17/14 0628  WBC 10.2 10.0 5.2  NEUTROABS 7.3 8.0*  --   HGB 12.2 11.6* 11.2*  HCT 36.0 34.9* 32.8*  MCV 106.8* 106.1* 105.1*  PLT 231 218 201   Cardiac Enzymes:  Recent Labs Lab 08/13/14 1830  TROPONINI <0.03   BNP: BNP (last 3 results)  Recent Labs  04/10/14 1025  BNP 51.4    ProBNP (last 3 results)  Recent Labs  03/10/14 1201  PROBNP 227.7*    CBG: No results for input(s): GLUCAP in the last 168 hours.     Signed:  Charlynne Cousins  [facility] 08/18/2014, 8:43 AM

## 2014-08-18 NOTE — Progress Notes (Signed)
Patient given discharge instructions via teach back method.  Patient verbalized understanding of instructions and had no further questions.  Will continue with discharging patient.

## 2014-09-11 ENCOUNTER — Encounter (HOSPITAL_COMMUNITY): Payer: Self-pay | Admitting: Surgery

## 2014-11-12 ENCOUNTER — Emergency Department (HOSPITAL_COMMUNITY)
Admission: EM | Admit: 2014-11-12 | Discharge: 2014-11-12 | Disposition: A | Discharge status: Home / Self Care | Payer: BLUE CROSS/BLUE SHIELD | Attending: Emergency Medicine | Admitting: Emergency Medicine

## 2014-11-12 ENCOUNTER — Encounter (HOSPITAL_COMMUNITY): Payer: Self-pay

## 2014-11-12 ENCOUNTER — Emergency Department (HOSPITAL_COMMUNITY): Payer: BLUE CROSS/BLUE SHIELD

## 2014-11-12 DIAGNOSIS — Z9049 Acquired absence of other specified parts of digestive tract: Secondary | ICD-10-CM | POA: Diagnosis not present

## 2014-11-12 DIAGNOSIS — Z862 Personal history of diseases of the blood and blood-forming organs and certain disorders involving the immune mechanism: Secondary | ICD-10-CM | POA: Diagnosis not present

## 2014-11-12 DIAGNOSIS — E876 Hypokalemia: Secondary | ICD-10-CM | POA: Diagnosis not present

## 2014-11-12 DIAGNOSIS — R109 Unspecified abdominal pain: Secondary | ICD-10-CM | POA: Diagnosis present

## 2014-11-12 DIAGNOSIS — Z8619 Personal history of other infectious and parasitic diseases: Secondary | ICD-10-CM | POA: Insufficient documentation

## 2014-11-12 DIAGNOSIS — F329 Major depressive disorder, single episode, unspecified: Secondary | ICD-10-CM | POA: Diagnosis not present

## 2014-11-12 DIAGNOSIS — Z79899 Other long term (current) drug therapy: Secondary | ICD-10-CM | POA: Insufficient documentation

## 2014-11-12 DIAGNOSIS — Z880 Allergy status to penicillin: Secondary | ICD-10-CM | POA: Diagnosis not present

## 2014-11-12 DIAGNOSIS — I456 Pre-excitation syndrome: Secondary | ICD-10-CM | POA: Insufficient documentation

## 2014-11-12 DIAGNOSIS — R1013 Epigastric pain: Secondary | ICD-10-CM | POA: Insufficient documentation

## 2014-11-12 DIAGNOSIS — Z9851 Tubal ligation status: Secondary | ICD-10-CM | POA: Diagnosis not present

## 2014-11-12 DIAGNOSIS — Z7952 Long term (current) use of systemic steroids: Secondary | ICD-10-CM | POA: Diagnosis not present

## 2014-11-12 DIAGNOSIS — F419 Anxiety disorder, unspecified: Secondary | ICD-10-CM | POA: Diagnosis not present

## 2014-11-12 DIAGNOSIS — G8929 Other chronic pain: Secondary | ICD-10-CM | POA: Insufficient documentation

## 2014-11-12 DIAGNOSIS — Z87442 Personal history of urinary calculi: Secondary | ICD-10-CM | POA: Insufficient documentation

## 2014-11-12 DIAGNOSIS — Z90710 Acquired absence of both cervix and uterus: Secondary | ICD-10-CM | POA: Insufficient documentation

## 2014-11-12 DIAGNOSIS — Z8719 Personal history of other diseases of the digestive system: Secondary | ICD-10-CM | POA: Diagnosis not present

## 2014-11-12 DIAGNOSIS — Z720 Tobacco use: Secondary | ICD-10-CM | POA: Insufficient documentation

## 2014-11-12 LAB — BASIC METABOLIC PANEL
ANION GAP: 8 (ref 5–15)
BUN: 12 mg/dL (ref 6–20)
CHLORIDE: 109 mmol/L (ref 101–111)
CO2: 22 mmol/L (ref 22–32)
Calcium: 9.2 mg/dL (ref 8.9–10.3)
Creatinine, Ser: 0.52 mg/dL (ref 0.44–1.00)
GFR calc Af Amer: >60 mL/min (ref >60)
GFR calc non Af Amer: >60 mL/min (ref >60)
GLUCOSE: 121 mg/dL — AB (ref 65–99)
POTASSIUM: 2.5 mmol/L — AB (ref 3.5–5.1)
Sodium: 139 mmol/L (ref 135–145)

## 2014-11-12 LAB — CBC
HEMATOCRIT: 37.5 % (ref 36.0–46.0)
HEMOGLOBIN: 12.8 g/dL (ref 12.0–15.0)
MCH: 36 pg — AB (ref 26.0–34.0)
MCHC: 34.1 g/dL (ref 30.0–36.0)
MCV: 105.3 fL — AB (ref 78.0–100.0)
PLATELETS: 232 10*3/uL (ref 150–400)
RBC: 3.56 MIL/uL — ABNORMAL LOW (ref 3.87–5.11)
RDW: 13.8 % (ref 11.5–15.5)
WBC: 8.5 10*3/uL (ref 4.0–10.5)

## 2014-11-12 LAB — URINALYSIS, ROUTINE W REFLEX MICROSCOPIC
Bilirubin Urine: NEGATIVE
Glucose, UA: NEGATIVE mg/dL
Hgb urine dipstick: NEGATIVE
Ketones, ur: NEGATIVE mg/dL
Leukocytes, UA: NEGATIVE
NITRITE: NEGATIVE
Protein, ur: NEGATIVE mg/dL
SPECIFIC GRAVITY, URINE: 1.03 (ref 1.005–1.030)
Urobilinogen, UA: 0.2 mg/dL (ref 0.0–1.0)
pH: 6 (ref 5.0–8.0)

## 2014-11-12 LAB — HEPATIC FUNCTION PANEL
ALBUMIN: 4 g/dL (ref 3.5–5.0)
ALK PHOS: 141 U/L — AB (ref 38–126)
ALT: 12 U/L — ABNORMAL LOW (ref 14–54)
AST: 18 U/L (ref 15–41)
Bilirubin, Direct: <0.1 mg/dL — ABNORMAL LOW (ref 0.1–0.5)
Total Protein: 6.8 g/dL (ref 6.5–8.1)

## 2014-11-12 LAB — LIPASE, BLOOD: LIPASE: 13 U/L — AB (ref 22–51)

## 2014-11-12 MED ORDER — DIPHENHYDRAMINE HCL 50 MG/ML IJ SOLN
25.0000 mg | Freq: Once | INTRAMUSCULAR | Status: AC
  Administered 2014-11-12: 25 mg via INTRAVENOUS
  Filled 2014-11-12: qty 1

## 2014-11-12 MED ORDER — IOHEXOL 300 MG/ML  SOLN
25.0000 mL | Freq: Once | INTRAMUSCULAR | Status: AC | PRN
  Administered 2014-11-12: 25 mL via ORAL

## 2014-11-12 MED ORDER — PROCHLORPERAZINE EDISYLATE 5 MG/ML IJ SOLN
10.0000 mg | Freq: Once | INTRAMUSCULAR | Status: AC
  Administered 2014-11-12: 10 mg via INTRAVENOUS
  Filled 2014-11-12: qty 2

## 2014-11-12 MED ORDER — IOHEXOL 300 MG/ML  SOLN
100.0000 mL | Freq: Once | INTRAMUSCULAR | Status: AC | PRN
  Administered 2014-11-12: 100 mL via INTRAVENOUS

## 2014-11-12 MED ORDER — POTASSIUM CHLORIDE CRYS ER 20 MEQ PO TBCR
40.0000 meq | EXTENDED_RELEASE_TABLET | ORAL | Status: DC
  Administered 2014-11-12: 40 meq via ORAL
  Filled 2014-11-12: qty 2

## 2014-11-12 MED ORDER — SODIUM CHLORIDE 0.9 % IV BOLUS (SEPSIS)
1000.0000 mL | Freq: Once | INTRAVENOUS | Status: AC
  Administered 2014-11-12: 1000 mL via INTRAVENOUS

## 2014-11-12 MED ORDER — MORPHINE SULFATE 4 MG/ML IJ SOLN
4.0000 mg | Freq: Once | INTRAMUSCULAR | Status: AC
  Administered 2014-11-12: 4 mg via INTRAVENOUS
  Filled 2014-11-12: qty 1

## 2014-11-12 NOTE — ED Notes (Signed)
Pt complains of right side pain, nausea, vomiting, diarrhea for two days, currently lives in [place] here visiting her kids

## 2014-11-12 NOTE — Discharge Instructions (Signed)

## 2014-11-12 NOTE — ED Provider Notes (Addendum)
CSN: [id number]     Arrival date & time 11/12/14  1855 History   First MD Initiated Contact with Patient 11/12/14 2026     Chief Complaint  Patient presents with  . Flank Pain     (Consider location/radiation/quality/duration/timing/severity/associated sxs/prior Treatment) Patient is a 49 y.o. female presenting with flank pain. The history is provided by the patient.  Flank Pain This is a chronic problem. The current episode started less than 1 hour ago. The problem occurs constantly. The problem has not changed since onset.Associated symptoms include abdominal pain. Pertinent negatives include no chest pain, no headaches and no shortness of breath. Nothing aggravates the symptoms. Nothing relieves the symptoms. She has tried nothing for the symptoms. The treatment provided no relief.    49 yo F with a chief complaint of epigastric as well as right flank pain. Patient states is been going on for about 2 days. Associated with nausea vomiting diarrhea. Patient denies fevers or chills. Patient denies blood in her stool. Patient denies bilious or bloody emesis. Patient states she's had pain like this before and that was colitis. Patient does have a history of kidney stones but thinks that this feels different. Patient states that she is from out of town though has 4 recent admissions in the past 4 months. Also has documented multiple episodes of pain seeking behavior.   Past Medical History  Diagnosis Date  . Collagenous colitis   . WPW (Wolff-Parkinson-White syndrome)   . Addison disease   . Thyroid disease   . Clostridium difficile diarrhea   . Anxiety and depression   . Chronic diarrhea   . Chronic back pain   . Drug-seeking behavior   . Irritable bowel syndrome (IBS)   . Anemia   . Pancreatitis   . Kidney stone   . Kidney stone   . Depression   . Anxiety   . Vaginal delivery [id number], 1998  . Hypokalemia    Past Surgical History  Procedure Laterality Date  . Cholecystectomy     . Tubal ligation    . Lithotripsy    . Sphincterotomy    . Cardiac electrophysiology mapping and ablation      for WPW  . Video bronchoscopy  08/31/2011    Procedure: VIDEO BRONCHOSCOPY WITH FLUORO;  Surgeon: Elsie Stain, MD;  Location: [facility];  Service: Cardiopulmonary;  Laterality: N/A;  . Vaginal hysterectomy  10/28/2013    Procedure: HYSTERECTOMY VAGINAL;  Surgeon: Emily Filbert, MD;  Location: [facility];  Service: Gynecology;;  . Salpingoophorectomy Bilateral 10/28/2013    Procedure: SALPINGO OOPHORECTOMY;  Surgeon: Emily Filbert, MD;  Location: [facility];  Service: Gynecology;  Laterality: Bilateral;  . Abdominal hysterectomy  10/28/2013  . Esophagogastroduodenoscopy (egd) with propofol N/A 06/04/2014    Procedure: ESOPHAGOGASTRODUODENOSCOPY (EGD) WITH PROPOFOL;  Surgeon: Jeryl Columbia, MD;  Location: [facility];  Service: Endoscopy;  Laterality: N/A;   Family History  Problem Relation Age of Onset  . Prostate cancer Father   . Coronary artery disease Maternal Grandfather    Social History  Substance Use Topics  . Smoking status: Current Every Day Smoker -- 0.25 packs/day for 30 years    Types: Cigarettes  . Smokeless tobacco: Never Used  . Alcohol Use: Yes     Comment: seldom   OB History    Gravida Para Term Preterm AB TAB SAB Ectopic Multiple Living   3 3 3       3      Review of  Systems  Constitutional: Negative for fever and chills.  HENT: Negative for congestion and rhinorrhea.   Eyes: Negative for redness and visual disturbance.  Respiratory: Negative for shortness of breath and wheezing.   Cardiovascular: Negative for chest pain and palpitations.  Gastrointestinal: Positive for abdominal pain. Negative for nausea, vomiting and blood in stool.  Genitourinary: Positive for flank pain. Negative for dysuria and urgency.  Musculoskeletal: Negative for myalgias and arthralgias.  Skin: Negative for pallor and wound.  Neurological: Negative for dizziness and headaches.       Allergies  Prednisone; Amoxicillin; Butrans; Nsaids; and Zofran  Home Medications   Prior to Admission medications   Medication Sig Start Date End Date Taking? Authorizing Provider  acetaminophen (TYLENOL) 325 MG tablet Take 650 mg by mouth every 6 (six) hours as needed for mild pain or moderate pain (back pain).    Yes Historical Provider, MD  budesonide (ENTOCORT EC) 3 MG 24 hr capsule Take 3 capsules (9 mg total) by mouth daily. 07/31/14  Yes Verlee Monte, MD  butalbital-acetaminophen-caffeine (FIORICET, ESGIC) 50-325-40 MG per tablet Take 1 tablet by mouth 4 (four) times daily as needed. Headaches. 11/09/14  Yes Historical Provider, MD  escitalopram (LEXAPRO) 20 MG tablet Take 20 mg by mouth every morning.    Yes Historical Provider, MD  lisdexamfetamine (VYVANSE) 30 MG capsule Take 30 mg by mouth daily. 08/04/14  Yes Historical Provider, MD  loperamide (IMODIUM) 2 MG capsule Take 1 capsule (2 mg total) by mouth as needed for diarrhea or loose stools. 08/18/14  Yes Charlynne Cousins, MD  LORazepam (ATIVAN) 1 MG tablet Take 1 mg by mouth 2 (two) times daily as needed for anxiety (anxiety).    Yes Historical Provider, MD  Multiple Vitamin (MULITIVITAMIN WITH MINERALS) TABS Take 1 tablet by mouth every morning.    Yes Historical Provider, MD  pantoprazole (PROTONIX) 40 MG tablet Take 1 tablet (40 mg total) by mouth 2 (two) times daily. 06/05/14  Yes Charlynne Cousins, MD  potassium chloride SA (K-DUR,KLOR-CON) 20 MEQ tablet Take 2 tablets (40 mEq total) by mouth 2 (two) times daily. 08/18/14  Yes Charlynne Cousins, MD  promethazine (PHENERGAN) 25 MG tablet Take 1 tablet (25 mg total) by mouth every 6 (six) hours as needed for nausea or vomiting. 08/18/14  Yes Charlynne Cousins, MD  SUMAtriptan (IMITREX) 100 MG tablet Take 50 tablets by mouth once as needed. Migraine.  May repeat in 2 hours if needed. 10/21/14  Yes Historical Provider, MD  cyanocobalamin (,VITAMIN B-12,) 1000 MCG/ML  injection Inject 1 mL (1,000 mcg total) into the muscle once. Patient not taking: Reported on 11/12/2014 08/18/14   Charlynne Cousins, MD  HYDROcodone-acetaminophen [facility]) 5-325 MG per tablet Take 1 tablet by mouth every 6 (six) hours as needed for moderate pain. Patient not taking: Reported on 11/12/2014 08/18/14   Charlynne Cousins, MD  methocarbamol (ROBAXIN) 500 MG tablet Take 2 tablets (1,000 mg total) by mouth 4 (four) times daily. Patient not taking: Reported on 08/16/2014 07/23/14   Carlisle Cater, PA-C   BP 120/73 mmHg  Pulse 86  Temp(Src) 98 F (36.7 C) (Oral)  Resp 20  Ht 5\' 4"  (1.626 m)  SpO2 100%  LMP 08/06/2013 Physical Exam  Constitutional: She is oriented to person, place, and time. She appears well-developed and well-nourished. No distress.  HENT:  Head: Normocephalic and atraumatic.  Eyes: EOM are normal. Pupils are equal, round, and reactive to light.  Neck: Normal range of motion.  Neck supple.  Cardiovascular: Normal rate and regular rhythm.  Exam reveals no gallop and no friction rub.   No murmur heard. Pulmonary/Chest: Effort normal. She has no wheezes. She has no rales.  Abdominal: Soft. She exhibits no distension (mild right flank tenderness, negative Murphy sign mild epigastric tenderness). There is tenderness. There is no rebound and no guarding.  Musculoskeletal: She exhibits no edema or tenderness.  Neurological: She is alert and oriented to person, place, and time.  Skin: Skin is warm and dry. She is not diaphoretic.  Psychiatric: She has a normal mood and affect. Her behavior is normal.    ED Course  Procedures (including critical care time) Labs Review Labs Reviewed  URINALYSIS, ROUTINE W REFLEX MICROSCOPIC (NOT AT [facility]) - Abnormal; Notable for the following:    APPearance CLOUDY (*)    All other components within normal limits  BASIC METABOLIC PANEL - Abnormal; Notable for the following:    Potassium 2.5 (*)    Glucose, Bld 121 (*)    All other  components within normal limits  CBC - Abnormal; Notable for the following:    RBC 3.56 (*)    MCV 105.3 (*)    MCH 36.0 (*)    All other components within normal limits  HEPATIC FUNCTION PANEL - Abnormal; Notable for the following:    ALT 12 (*)    Alkaline Phosphatase 141 (*)    Total Bilirubin <0.1 (*)    Bilirubin, Direct <0.1 (*)    All other components within normal limits  LIPASE, BLOOD - Abnormal; Notable for the following:    Lipase 13 (*)    All other components within normal limits    Imaging Review Ct Abdomen Pelvis W Contrast  11/12/2014   CLINICAL DATA:  Right-sided abdominal pain with nausea and vomiting. Diarrhea for 2 days  EXAM: CT ABDOMEN AND PELVIS WITH CONTRAST  TECHNIQUE: Multidetector CT imaging of the abdomen and pelvis was performed using the standard protocol following bolus administration of intravenous contrast. Oral contrast was also administered.  CONTRAST:  18mL OMNIPAQUE IOHEXOL 300 MG/ML SOLN, 46mL OMNIPAQUE IOHEXOL 300 MG/ML SOLN  COMPARISON:  June 03, 2014  FINDINGS: Lung bases are clear.  There is mild hepatic steatosis. No focal liver lesions are identified. Gallbladder is absent. There is mild intrahepatic and extrahepatic biliary duct dilatation. The proximal common bile duct measures 12 mm, unchanged from prior study. No mass or calculus is seen in the biliary ductal system.  Spleen appears normal.  Adrenals appear normal.  No pancreatic mass or inflammation is seen. The pancreatic duct is prominent, measuring approximately 3 mm in the head -body junction region. This finding was also present previously.  There are scattered 1-2 mm calculi throughout the right kidney. There are scattered 1-2 mm calculi in the left kidney as well. There is no renal mass or hydronephrosis on either side. There is no ureteral calculus on either side.  In the pelvis, there is no mass or fluid collection. Urinary bladder is midline with normal wall thickness. The uterus is  absent.  The appendix appears normal.  There is no bowel obstruction. No free air or portal venous air. There is no demonstrable ascites, adenopathy, or abscess in the abdomen or pelvis. There is no abdominal aortic aneurysm. There are no blastic or lytic bone lesions.  IMPRESSION: Stable hepatic steatosis. Gallbladder is absent. There is mild biliary duct dilatation without mass or calculus seen. There is also mild pancreatic duct prominence without mass  or inflammatory focus in the pancreas. If further evaluation of this ductal dilatation is felt to be warranted, nonemergent MRCP could be helpful to further assess.  Uterus absent.  No bowel obstruction. No abscess. No bowel wall or mesenteric thickening. Appendix appears normal.   Electronically Signed   By: Lowella Grip III M.D.   On: 11/12/2014 22:36     EKG Interpretation None      MDM   Final diagnoses:  Epigastric pain  Hypokalemia    49 yo F with a chief complaint of epigastric as well as right flank pain. CT scan rule out colitis. Afebrile stable vital signs on arrival.  Patient with a CT scan with no acute abnormalities. UA without infection or blood. Hypokalemia appears to be at baseline. Given replacement here. Able to tolerate by mouth. Reassess patient resting comfortably however I came into the room started writhing in pain. Patient requesting more pain and nausea medications. Discussed patient's prior visits and without current abdominal pathology would not give her further IV pain medicine. 10:51 PM:  I have discussed the diagnosis/risks/treatment options with the patient and believe the pt to be eligible for discharge home to follow-up with PCP. We also discussed returning to the ED immediately if new or worsening sx occur. We discussed the sx which are most concerning (e.g., sudden worsening pain) that necessitate immediate return. Medications administered to the patient during their visit and any new prescriptions provided  to the patient are listed below.  Medications given during this visit Medications  sodium chloride 0.9 % bolus 1,000 mL (0 mLs Intravenous Stopped 11/12/14 2239)  morphine 4 MG/ML injection 4 mg (4 mg Intravenous Given 11/12/14 2115)  prochlorperazine (COMPAZINE) injection 10 mg (10 mg Intravenous Given 11/12/14 2114)  diphenhydrAMINE (BENADRYL) injection 25 mg (25 mg Intravenous Given 11/12/14 2113)  iohexol (OMNIPAQUE) 300 MG/ML solution 100 mL (100 mLs Intravenous Contrast Given 11/12/14 2134)  iohexol (OMNIPAQUE) 300 MG/ML solution 25 mL (25 mLs Oral Contrast Given 11/12/14 2134)    New Prescriptions   No medications on file     The patient appears reasonably screen and/or stabilized for discharge and I doubt any other medical condition or other [facility] requiring further screening, evaluation, or treatment in the ED at this time prior to discharge.    Deno Etienne, DO 11/12/14 Parker, DO 11/12/14 2251

## 2015-06-08 ENCOUNTER — Encounter (HOSPITAL_COMMUNITY): Payer: Self-pay | Admitting: Emergency Medicine

## 2015-06-08 ENCOUNTER — Emergency Department (HOSPITAL_COMMUNITY)
Admission: EM | Admit: 2015-06-08 | Discharge: 2015-06-08 | Disposition: A | Discharge status: Home / Self Care | Payer: BLUE CROSS/BLUE SHIELD | Attending: Emergency Medicine | Admitting: Emergency Medicine

## 2015-06-08 DIAGNOSIS — E876 Hypokalemia: Secondary | ICD-10-CM | POA: Diagnosis not present

## 2015-06-08 DIAGNOSIS — D649 Anemia, unspecified: Secondary | ICD-10-CM | POA: Insufficient documentation

## 2015-06-08 DIAGNOSIS — K859 Acute pancreatitis without necrosis or infection, unspecified: Secondary | ICD-10-CM

## 2015-06-08 DIAGNOSIS — Z90710 Acquired absence of both cervix and uterus: Secondary | ICD-10-CM | POA: Insufficient documentation

## 2015-06-08 DIAGNOSIS — F329 Major depressive disorder, single episode, unspecified: Secondary | ICD-10-CM | POA: Diagnosis not present

## 2015-06-08 DIAGNOSIS — Z87891 Personal history of nicotine dependence: Secondary | ICD-10-CM | POA: Insufficient documentation

## 2015-06-08 DIAGNOSIS — Z8639 Personal history of other endocrine, nutritional and metabolic disease: Secondary | ICD-10-CM | POA: Insufficient documentation

## 2015-06-08 DIAGNOSIS — F419 Anxiety disorder, unspecified: Secondary | ICD-10-CM | POA: Insufficient documentation

## 2015-06-08 DIAGNOSIS — Z9851 Tubal ligation status: Secondary | ICD-10-CM | POA: Diagnosis not present

## 2015-06-08 DIAGNOSIS — Z9049 Acquired absence of other specified parts of digestive tract: Secondary | ICD-10-CM | POA: Diagnosis not present

## 2015-06-08 DIAGNOSIS — K861 Other chronic pancreatitis: Secondary | ICD-10-CM | POA: Insufficient documentation

## 2015-06-08 DIAGNOSIS — Z87442 Personal history of urinary calculi: Secondary | ICD-10-CM | POA: Diagnosis not present

## 2015-06-08 DIAGNOSIS — Z79899 Other long term (current) drug therapy: Secondary | ICD-10-CM | POA: Diagnosis not present

## 2015-06-08 DIAGNOSIS — G8929 Other chronic pain: Secondary | ICD-10-CM | POA: Diagnosis not present

## 2015-06-08 DIAGNOSIS — Z8679 Personal history of other diseases of the circulatory system: Secondary | ICD-10-CM | POA: Insufficient documentation

## 2015-06-08 DIAGNOSIS — Z880 Allergy status to penicillin: Secondary | ICD-10-CM | POA: Insufficient documentation

## 2015-06-08 DIAGNOSIS — R1010 Upper abdominal pain, unspecified: Secondary | ICD-10-CM | POA: Diagnosis present

## 2015-06-08 DIAGNOSIS — Z8719 Personal history of other diseases of the digestive system: Secondary | ICD-10-CM | POA: Diagnosis not present

## 2015-06-08 DIAGNOSIS — Z8619 Personal history of other infectious and parasitic diseases: Secondary | ICD-10-CM | POA: Insufficient documentation

## 2015-06-08 LAB — CBC
HCT: 38.5 % (ref 36.0–46.0)
HEMOGLOBIN: 12.5 g/dL (ref 12.0–15.0)
MCH: 34.3 pg — AB (ref 26.0–34.0)
MCHC: 32.5 g/dL (ref 30.0–36.0)
MCV: 105.8 fL — ABNORMAL HIGH (ref 78.0–100.0)
PLATELETS: 219 10*3/uL (ref 150–400)
RBC: 3.64 MIL/uL — AB (ref 3.87–5.11)
RDW: 14.8 % (ref 11.5–15.5)
WBC: 8 10*3/uL (ref 4.0–10.5)

## 2015-06-08 LAB — URINALYSIS, ROUTINE W REFLEX MICROSCOPIC
Bilirubin Urine: NEGATIVE
Glucose, UA: NEGATIVE mg/dL
Hgb urine dipstick: NEGATIVE
Ketones, ur: NEGATIVE mg/dL
LEUKOCYTES UA: NEGATIVE
NITRITE: NEGATIVE
Protein, ur: NEGATIVE mg/dL
Specific Gravity, Urine: 1.017 (ref 1.005–1.030)
pH: 5.5 (ref 5.0–8.0)

## 2015-06-08 LAB — COMPREHENSIVE METABOLIC PANEL
ALT: 30 U/L (ref 14–54)
ANION GAP: 8 (ref 5–15)
AST: 27 U/L (ref 15–41)
Albumin: 4.5 g/dL (ref 3.5–5.0)
Alkaline Phosphatase: 106 U/L (ref 38–126)
BILIRUBIN TOTAL: 0.3 mg/dL (ref 0.3–1.2)
BUN: 17 mg/dL (ref 6–20)
CALCIUM: 9.4 mg/dL (ref 8.9–10.3)
CO2: 25 mmol/L (ref 22–32)
Chloride: 105 mmol/L (ref 101–111)
Creatinine, Ser: 0.74 mg/dL (ref 0.44–1.00)
GFR calc non Af Amer: >60 mL/min (ref >60)
Glucose, Bld: 102 mg/dL — ABNORMAL HIGH (ref 65–99)
Potassium: 4.1 mmol/L (ref 3.5–5.1)
SODIUM: 138 mmol/L (ref 135–145)
TOTAL PROTEIN: 7.3 g/dL (ref 6.5–8.1)

## 2015-06-08 LAB — LIPASE, BLOOD: Lipase: 24 U/L (ref 11–51)

## 2015-06-08 MED ORDER — PROMETHAZINE HCL 25 MG/ML IJ SOLN
25.0000 mg | Freq: Once | INTRAMUSCULAR | Status: AC
  Administered 2015-06-08: 25 mg via INTRAVENOUS
  Filled 2015-06-08: qty 1

## 2015-06-08 MED ORDER — OXYCODONE-ACETAMINOPHEN 5-325 MG PO TABS: 1.0000 | ORAL_TABLET | ORAL | Status: DC | PRN

## 2015-06-08 MED ORDER — SODIUM CHLORIDE 0.9 % IV BOLUS (SEPSIS)
1000.0000 mL | Freq: Once | INTRAVENOUS | Status: AC
  Administered 2015-06-08: 1000 mL via INTRAVENOUS

## 2015-06-08 MED ORDER — MORPHINE SULFATE (PF) 4 MG/ML IV SOLN
4.0000 mg | Freq: Once | INTRAVENOUS | Status: AC
  Administered 2015-06-08: 4 mg via INTRAVENOUS
  Filled 2015-06-08: qty 1

## 2015-06-08 MED ORDER — PROMETHAZINE HCL 25 MG PO TABS: 25.0000 mg | ORAL_TABLET | Freq: Four times a day (QID) | ORAL | Status: DC | PRN

## 2015-06-08 MED ORDER — METOCLOPRAMIDE HCL 5 MG/ML IJ SOLN
10.0000 mg | Freq: Once | INTRAMUSCULAR | Status: AC
  Administered 2015-06-08: 10 mg via INTRAVENOUS
  Filled 2015-06-08: qty 2

## 2015-06-08 NOTE — ED Notes (Signed)
Jacquelynn Cree RM attempted IV x 2  not successful

## 2015-06-08 NOTE — ED Provider Notes (Signed)
CSN: [id number]     Arrival date & time 06/08/15  1027 History   First MD Initiated Contact with Patient 06/08/15 1207     Chief Complaint  Patient presents with  . Abdominal Pain  . Emesis     (Consider location/radiation/quality/duration/timing/severity/associated sxs/prior Treatment) HPI Comments: 50 year old female with past medical history including chronic pancreatitis, collagenous colitis, WPW status post ablation, IBS, kidney stones who presents with abdominal pain and vomiting. Patient states that she began having upper abdominal pain and vomiting yesterday that are consistent with previous episodes of pancreatitis. She has chronic diarrhea but denies any change in her bowel movements. No bloody stools. No fevers or cough/cold symptoms. No urinary symptoms.  Patient is a 50 y.o. female presenting with abdominal pain and vomiting. The history is provided by the patient.  Abdominal Pain Associated symptoms: vomiting   Emesis Associated symptoms: abdominal pain     Past Medical History  Diagnosis Date  . Collagenous colitis   . WPW (Wolff-Parkinson-White syndrome)   . Addison disease ([place])   . Thyroid disease   . Clostridium difficile diarrhea   . Anxiety and depression   . Chronic diarrhea   . Chronic back pain   . Drug-seeking behavior   . Irritable bowel syndrome (IBS)   . Anemia   . Pancreatitis   . Kidney stone   . Kidney stone   . Depression   . Anxiety   . Vaginal delivery [id number], 1998  . Hypokalemia    Past Surgical History  Procedure Laterality Date  . Cholecystectomy    . Tubal ligation    . Lithotripsy    . Sphincterotomy    . Cardiac electrophysiology mapping and ablation      for WPW  . Video bronchoscopy  08/31/2011    Procedure: VIDEO BRONCHOSCOPY WITH FLUORO;  Surgeon: Elsie Stain, MD;  Location: [facility];  Service: Cardiopulmonary;  Laterality: N/A;  . Vaginal hysterectomy  10/28/2013    Procedure: HYSTERECTOMY VAGINAL;  Surgeon:  Emily Filbert, MD;  Location: [facility];  Service: Gynecology;;  . Salpingoophorectomy Bilateral 10/28/2013    Procedure: SALPINGO OOPHORECTOMY;  Surgeon: Emily Filbert, MD;  Location: [facility];  Service: Gynecology;  Laterality: Bilateral;  . Abdominal hysterectomy  10/28/2013  . Esophagogastroduodenoscopy (egd) with propofol N/A 06/04/2014    Procedure: ESOPHAGOGASTRODUODENOSCOPY (EGD) WITH PROPOFOL;  Surgeon: Jeryl Columbia, MD;  Location: [facility];  Service: Endoscopy;  Laterality: N/A;   Family History  Problem Relation Age of Onset  . Prostate cancer Father   . Coronary artery disease Maternal Grandfather    Social History  Substance Use Topics  . Smoking status: Former Smoker -- 0.25 packs/day for 30 years    Types: Cigarettes    Quit date: 04/04/2015  . Smokeless tobacco: Never Used  . Alcohol Use: Yes     Comment: seldom   OB History    Gravida Para Term Preterm AB TAB SAB Ectopic Multiple Living   3 3 3       3      Review of Systems  Gastrointestinal: Positive for vomiting and abdominal pain.   10 Systems reviewed and are negative for acute change except as noted in the HPI.    Allergies  Prednisone; Amoxicillin; Butrans; Nsaids; and Zofran  Home Medications   Prior to Admission medications   Medication Sig Start Date End Date Taking? Authorizing Provider  acetaminophen (TYLENOL) 325 MG tablet Take 650 mg by mouth every 6 (six) hours as  needed for mild pain or moderate pain (back pain).    Yes Historical Provider, MD  budesonide (ENTOCORT EC) 3 MG 24 hr capsule Take 3 capsules (9 mg total) by mouth daily. 07/31/14  Yes Verlee Monte, MD  butalbital-acetaminophen-caffeine (FIORICET, ESGIC) 50-325-40 MG per tablet Take 1 tablet by mouth 4 (four) times daily as needed. Headaches. 11/09/14  Yes Historical Provider, MD  clidinium-chlordiazePOXIDE (LIBRAX) 5-2.5 MG capsule Take 1 capsule by mouth 2 (two) times daily as needed (ulcers).    Yes Historical Provider, MD  cyanocobalamin  (,VITAMIN B-12,) 1000 MCG/ML injection Inject 1 mL (1,000 mcg total) into the muscle once. Patient taking differently: Inject 1,000 mcg into the muscle every 7 (seven) days.  08/18/14  Yes Charlynne Cousins, MD  escitalopram (LEXAPRO) 20 MG tablet Take 20 mg by mouth every morning.    Yes Historical Provider, MD  famotidine (PEPCID) 40 MG tablet Take 40 mg by mouth daily as needed for heartburn or indigestion.   Yes Historical Provider, MD  folic acid (FOLVITE) 1 MG tablet Take 1 mg by mouth daily.   Yes Historical Provider, MD  lipase/protease/amylase (CREON) 36000 UNITS CPEP capsule Take [id number] Units by mouth as directed. Take 2 capsules before meals and 1 capsule before a single snack daily   Yes Historical Provider, MD  loperamide (IMODIUM) 2 MG capsule Take 1 capsule (2 mg total) by mouth as needed for diarrhea or loose stools. 08/18/14  Yes Charlynne Cousins, MD  LORazepam (ATIVAN) 1 MG tablet Take 1 mg by mouth 2 (two) times daily as needed for anxiety (anxiety).    Yes Historical Provider, MD  Multiple Vitamin (MULITIVITAMIN WITH MINERALS) TABS Take 1 tablet by mouth every morning.    Yes Historical Provider, MD  potassium chloride SA (K-DUR,KLOR-CON) 20 MEQ tablet Take 2 tablets (40 mEq total) by mouth 2 (two) times daily. Patient taking differently: Take 20 mEq by mouth 2 (two) times daily.  08/18/14  Yes Charlynne Cousins, MD  Vitamin D, Ergocalciferol, (DRISDOL) 50000 units CAPS capsule Take 50,000 Units by mouth every 7 (seven) days.   Yes Historical Provider, MD  zolpidem (AMBIEN) 10 MG tablet Take 10 mg by mouth at bedtime as needed for sleep.   Yes Historical Provider, MD  oxyCODONE-acetaminophen (PERCOCET) 5-325 MG tablet Take 1 tablet by mouth every 4 (four) hours as needed for severe pain. 06/08/15   Sharlett Iles, MD  promethazine (PHENERGAN) 25 MG tablet Take 1 tablet (25 mg total) by mouth every 6 (six) hours as needed for nausea or vomiting. 06/08/15   Wenda Overland  Little, MD   BP 129/74 mmHg  Pulse 73  Temp(Src) 98.1 F (36.7 C) (Oral)  Resp 16  Ht 5\' 4"  (1.626 m)  Wt 115 lb (52.164 kg)  BMI 19.73 kg/m2  SpO2 97%  LMP 08/06/2013 Physical Exam  Constitutional: She is oriented to person, place, and time. She appears well-developed and well-nourished. No distress.  Uncomfortable  HENT:  Head: Normocephalic and atraumatic.  Moist mucous membranes  Eyes: Conjunctivae are normal. Pupils are equal, round, and reactive to light.  Neck: Neck supple.  Cardiovascular: Normal rate, regular rhythm and normal heart sounds.   No murmur heard. Pulmonary/Chest: Effort normal and breath sounds normal.  Abdominal: Soft. Bowel sounds are normal. She exhibits no distension. There is tenderness. There is no rebound and no guarding.  Midepigastric tenderness to palpation  Musculoskeletal: She exhibits no edema.  Neurological: She is alert and oriented to person,  place, and time.  Fluent speech  Skin: Skin is warm and dry.  Psychiatric: She has a normal mood and affect. Judgment normal.  Nursing note and vitals reviewed.   ED Course  Procedures (including critical care time) Labs Review Labs Reviewed  COMPREHENSIVE METABOLIC PANEL - Abnormal; Notable for the following:    Glucose, Bld 102 (*)    All other components within normal limits  CBC - Abnormal; Notable for the following:    RBC 3.64 (*)    MCV 105.8 (*)    MCH 34.3 (*)    All other components within normal limits  URINALYSIS, ROUTINE W REFLEX MICROSCOPIC (NOT AT [facility]) - Abnormal; Notable for the following:    APPearance CLOUDY (*)    All other components within normal limits  LIPASE, BLOOD    Imaging Review No results found. I have personally reviewed and evaluated these lab results as part of my medical decision-making.   EKG Interpretation None     Medications  sodium chloride 0.9 % bolus 1,000 mL (1,000 mLs Intravenous Rate/Dose Change 06/08/15 1533)  morphine 4 MG/ML injection 4  mg (4 mg Intravenous Given 06/08/15 1411)  metoCLOPramide (REGLAN) injection 10 mg (10 mg Intravenous Given 06/08/15 1411)  promethazine (PHENERGAN) injection 25 mg (25 mg Intravenous Given 06/08/15 1530)    MDM   Final diagnoses:  Acute on chronic pancreatitis (HCC)   Pt w/ h/o chronic pancreatitis Presents with 1 day of epigastric abdominal pain and vomiting consistent with previous episodes of pancreatitis. On exam she was uncomfortable but nontoxic and in no acute distress. Vital signs unremarkable. Midepigastric tenderness noted with no lower abdominal pain. Obtained above lab work and gave the patient an IV fluid bolus, Reglan, morphine, and later Phenergan. Labwork unremarkable, lipase 24, WBC 8. On reexamination, the patient was comfortable and stated that her symptoms had improved. She was able to drink Sprite. I discussed supportive care including liquid diet and provided with medications to control symptoms at home. Emphasized importance of follow-up with PCP. Reviewed return precautions and patient discharged in satisfactory condition.      Sharlett Iles, MD 06/08/15 1640

## 2015-06-08 NOTE — ED Notes (Signed)
Pt reports generalized upper abdominal pain and emesis related to flare up of pancreatitis onset yesterday.

## 2015-12-22 ENCOUNTER — Emergency Department: Payer: BLUE CROSS/BLUE SHIELD

## 2015-12-22 ENCOUNTER — Encounter: Payer: Self-pay | Admitting: Emergency Medicine

## 2015-12-22 ENCOUNTER — Emergency Department
Admission: EM | Admit: 2015-12-22 | Discharge: 2015-12-22 | Disposition: A | Discharge status: Home / Self Care | Payer: BLUE CROSS/BLUE SHIELD | Attending: Emergency Medicine | Admitting: Emergency Medicine

## 2015-12-22 DIAGNOSIS — E039 Hypothyroidism, unspecified: Secondary | ICD-10-CM | POA: Insufficient documentation

## 2015-12-22 DIAGNOSIS — R197 Diarrhea, unspecified: Secondary | ICD-10-CM | POA: Insufficient documentation

## 2015-12-22 DIAGNOSIS — E876 Hypokalemia: Secondary | ICD-10-CM | POA: Insufficient documentation

## 2015-12-22 DIAGNOSIS — Z87891 Personal history of nicotine dependence: Secondary | ICD-10-CM | POA: Diagnosis not present

## 2015-12-22 DIAGNOSIS — R112 Nausea with vomiting, unspecified: Secondary | ICD-10-CM | POA: Insufficient documentation

## 2015-12-22 DIAGNOSIS — Z79899 Other long term (current) drug therapy: Secondary | ICD-10-CM | POA: Diagnosis not present

## 2015-12-22 DIAGNOSIS — R1013 Epigastric pain: Secondary | ICD-10-CM | POA: Diagnosis present

## 2015-12-22 LAB — COMPREHENSIVE METABOLIC PANEL
ALT: 13 U/L — ABNORMAL LOW (ref 14–54)
ANION GAP: 12 (ref 5–15)
AST: 14 U/L — ABNORMAL LOW (ref 15–41)
Albumin: 4.4 g/dL (ref 3.5–5.0)
Alkaline Phosphatase: 153 U/L — ABNORMAL HIGH (ref 38–126)
BILIRUBIN TOTAL: 0.5 mg/dL (ref 0.3–1.2)
BUN: 19 mg/dL (ref 6–20)
CHLORIDE: 108 mmol/L (ref 101–111)
CO2: 19 mmol/L — ABNORMAL LOW (ref 22–32)
Calcium: 9.2 mg/dL (ref 8.9–10.3)
Creatinine, Ser: 0.69 mg/dL (ref 0.44–1.00)
GFR calc Af Amer: >60 mL/min (ref >60)
GLUCOSE: 95 mg/dL (ref 65–99)
POTASSIUM: 2.4 mmol/L — AB (ref 3.5–5.1)
Sodium: 139 mmol/L (ref 135–145)
TOTAL PROTEIN: 7.7 g/dL (ref 6.5–8.1)

## 2015-12-22 LAB — CBC
HCT: 39.9 % (ref 35.0–47.0)
HEMOGLOBIN: 14 g/dL (ref 12.0–16.0)
MCH: 36.3 pg — ABNORMAL HIGH (ref 26.0–34.0)
MCHC: 35.2 g/dL (ref 32.0–36.0)
MCV: 103.3 fL — ABNORMAL HIGH (ref 80.0–100.0)
Platelets: 239 10*3/uL (ref 150–440)
RBC: 3.87 MIL/uL (ref 3.80–5.20)
RDW: 14 % (ref 11.5–14.5)
WBC: 10 10*3/uL (ref 3.6–11.0)

## 2015-12-22 LAB — URINALYSIS COMPLETE WITH MICROSCOPIC (ARMC ONLY)
Bacteria, UA: NONE SEEN
Bilirubin Urine: NEGATIVE
Glucose, UA: NEGATIVE mg/dL
Ketones, ur: NEGATIVE mg/dL
LEUKOCYTES UA: NEGATIVE
NITRITE: NEGATIVE
Protein, ur: 30 mg/dL — AB
Specific Gravity, Urine: 1.027 (ref 1.005–1.030)
pH: 6 (ref 5.0–8.0)

## 2015-12-22 LAB — C DIFFICILE QUICK SCREEN W PCR REFLEX
C DIFFICLE (CDIFF) ANTIGEN: NEGATIVE
C Diff interpretation: NEGATIVE
C Diff toxin: NEGATIVE

## 2015-12-22 LAB — TROPONIN I: Troponin I: <0.03 ng/mL (ref <0.03)

## 2015-12-22 LAB — MAGNESIUM: Magnesium: 2 mg/dL (ref 1.7–2.4)

## 2015-12-22 LAB — LIPASE, BLOOD: LIPASE: 18 U/L (ref 11–51)

## 2015-12-22 MED ORDER — BUTALBITAL-APAP-CAFFEINE 50-325-40 MG PO TABS: 1.0000 | ORAL_TABLET | Freq: Four times a day (QID) | ORAL | 0 refills | Status: AC | PRN

## 2015-12-22 MED ORDER — OXYCODONE-ACETAMINOPHEN 5-325 MG PO TABS: 1.0000 | ORAL_TABLET | ORAL | 0 refills | Status: DC | PRN

## 2015-12-22 MED ORDER — POTASSIUM CHLORIDE 10 MEQ/100ML IV SOLN
10.0000 meq | Freq: Once | INTRAVENOUS | Status: DC
  Filled 2015-12-22: qty 100

## 2015-12-22 MED ORDER — IOPAMIDOL (ISOVUE-300) INJECTION 61%
100.0000 mL | Freq: Once | INTRAVENOUS | Status: AC | PRN
  Administered 2015-12-22: 100 mL via INTRAVENOUS

## 2015-12-22 MED ORDER — POTASSIUM CHLORIDE CRYS ER 20 MEQ PO TBCR: 40.0000 meq | EXTENDED_RELEASE_TABLET | Freq: Once | ORAL | Status: DC

## 2015-12-22 MED ORDER — SODIUM CHLORIDE 0.9 % IV BOLUS (SEPSIS)
2000.0000 mL | Freq: Once | INTRAVENOUS | Status: AC
  Administered 2015-12-22: 2000 mL via INTRAVENOUS

## 2015-12-22 MED ORDER — IOPAMIDOL (ISOVUE-300) INJECTION 61%
30.0000 mL | Freq: Once | INTRAVENOUS | Status: AC
  Administered 2015-12-22: 30 mL via ORAL

## 2015-12-22 MED ORDER — MORPHINE SULFATE (PF) 4 MG/ML IV SOLN
4.0000 mg | Freq: Once | INTRAVENOUS | Status: AC
  Administered 2015-12-22: 4 mg via INTRAVENOUS
  Filled 2015-12-22: qty 1

## 2015-12-22 MED ORDER — POTASSIUM CHLORIDE 20 MEQ PO PACK
PACK | ORAL | Status: AC
Start: 2015-12-22 — End: 2015-12-22
  Administered 2015-12-22: 40 meq
  Filled 2015-12-22: qty 2

## 2015-12-22 MED ORDER — OXYCODONE-ACETAMINOPHEN 5-325 MG PO TABS
1.0000 | ORAL_TABLET | Freq: Once | ORAL | Status: AC
  Administered 2015-12-22: 1 via ORAL
  Filled 2015-12-22: qty 1

## 2015-12-22 MED ORDER — METOCLOPRAMIDE HCL 5 MG/ML IJ SOLN
10.0000 mg | Freq: Once | INTRAMUSCULAR | Status: AC
  Administered 2015-12-22: 10 mg via INTRAVENOUS
  Filled 2015-12-22: qty 2

## 2015-12-22 MED ORDER — METOCLOPRAMIDE HCL 10 MG PO TABS: 10.0000 mg | ORAL_TABLET | Freq: Three times a day (TID) | ORAL | 1 refills | Status: DC

## 2015-12-22 NOTE — ED Notes (Signed)
Pt returned from CT °

## 2015-12-22 NOTE — ED Notes (Addendum)
Pt ambulated back to room from triage with NAD noted. Pt states "i am here from [place] to visit my granddaughter. I do see a GI specialists there and they are unsure why I have chronic pancreatitis"

## 2015-12-22 NOTE — ED Notes (Signed)
Patient transported to CT 

## 2015-12-22 NOTE — Discharge Instructions (Signed)

## 2015-12-22 NOTE — ED Triage Notes (Signed)
Pt to ed with c/o abd pain that started on Sunday.  Pt states n/v/d.  Hx of pancreatitis.

## 2015-12-22 NOTE — ED Provider Notes (Signed)
[facility] Emergency Department Provider Note  ____________________________________________  Time seen: Approximately 6:46 PM  I have reviewed the triage vital signs and the nursing notes.   HISTORY  Chief Complaint Abdominal Pain; Emesis; and Diarrhea   HPI Dawn Frost is a 50 y.o. female history of Addison's disease, anemia, anxiety depression, C. difficile diarrhea, collagenous colitis, IBS, hypokalemia, chronic pancreatitis, WPW status post ablation who presents for evaluation of abdominal pain. Patient reports the pain is been going on for 3 days. The pain is epigastric, sharp, radiating to her back, similar to prior episodes of pancreatitis. She's also been having pain in the right lower quadrant that radiates to her right flank similar to prior episodes of colitis. She is here visiting her son from [place]. She reports multiple episodes of nonbloody nonbilious emesis and watery diarrhea. She denies melena, fever, dysuria, hematuria, chest pain, shortness of breath. She currently endorses that her pain is 8/10. She reports that she hasn't taken her potassium for the last few days because of her nausea and vomiting.  Past Medical History:  Diagnosis Date  . Addison disease ([place])   . Anemia   . Anxiety   . Anxiety and depression   . Chronic back pain   . Chronic diarrhea   . Clostridium difficile diarrhea   . Collagenous colitis   . Depression   . Drug-seeking behavior   . Hypokalemia   . Irritable bowel syndrome (IBS)   . Kidney stone   . Kidney stone   . Pancreatitis   . Thyroid disease   . Vaginal delivery [id number], 1998  . WPW (Wolff-Parkinson-White syndrome)     Patient Active Problem List   Diagnosis Date Noted  . Intractable nausea and vomiting 08/16/2014  . Macrocytic anemia 08/16/2014  . Hypokalemia, gastrointestinal losses 07/29/2014  . Peptic ulcer of duodenum 06/02/2014  . Epigastric pain 06/02/2014  . Nausea & vomiting  06/02/2014  . Insomnia 01/07/2014  . Migraine without aura 01/07/2014  . Post-operative state 10/28/2013  . Adenomyosis 08/08/2013  . Fibroids 08/08/2013  . Anxiety disorder 03/22/2012  . Panic disorder 03/22/2012  . Hypokalemia 01/30/2012  . Leukocytosis 01/30/2012  . Dehydration 01/30/2012  . Abdominal pain 01/06/2012  . Tobacco abuse 01/06/2012  . Hypersensitivity pneumonitis ([place]) 08/31/2011  . Chronic diarrhea 08/28/2011  . Collagenous colitis 08/28/2011  . Chest pain 08/28/2011  . Diarrhea 06/09/2011  . Esophagitis 02/11/2011  . Lymphocytic colitis 02/06/2011  . Adrenal insufficiency ([place]) 02/06/2011  . Hypothyroid 02/06/2011  . DEPRESSION 06/20/2010  . COSTOCHONDRITIS, ACUTE 03/14/2010  . 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 ACUTE GASTRITIS WITHOUT MENTION OF HEMORRHAGE 01/14/2009  . WEIGHT LOSS, RECENT 01/14/2009  . ANEMIA, MACROCYTIC, HX OF 01/14/2009    Past Surgical History:  Procedure Laterality Date  . ABDOMINAL HYSTERECTOMY  10/28/2013  . CARDIAC ELECTROPHYSIOLOGY MAPPING AND ABLATION     for WPW  . CHOLECYSTECTOMY    . ESOPHAGOGASTRODUODENOSCOPY (EGD) WITH PROPOFOL N/A 06/04/2014   Procedure: ESOPHAGOGASTRODUODENOSCOPY (EGD) WITH PROPOFOL;  Surgeon: Jeryl Columbia, MD;  Location: [facility];  Service: Endoscopy;  Laterality: N/A;  . LITHOTRIPSY    . SALPINGOOPHORECTOMY Bilateral 10/28/2013   Procedure: SALPINGO OOPHORECTOMY;  Surgeon: Emily Filbert, MD;  Location: [facility];  Service: Gynecology;  Laterality: Bilateral;  . SPHINCTEROTOMY    . TUBAL LIGATION    . VAGINAL HYSTERECTOMY  10/28/2013   Procedure: HYSTERECTOMY VAGINAL;  Surgeon: Emily Filbert, MD;  Location: [facility];  Service: Gynecology;;  .  VIDEO BRONCHOSCOPY  08/31/2011   Procedure: VIDEO BRONCHOSCOPY WITH FLUORO;  Surgeon: Elsie Stain, MD;  Location: [facility];  Service: Cardiopulmonary;  Laterality: N/A;    Prior to Admission medications   Medication Sig Start Date End  Date Taking? Authorizing Provider  acetaminophen (TYLENOL) 325 MG tablet Take 650 mg by mouth every 6 (six) hours as needed for mild pain or moderate pain (back pain).     Historical Provider, MD  budesonide (ENTOCORT EC) 3 MG 24 hr capsule Take 3 capsules (9 mg total) by mouth daily. 07/31/14   Verlee Monte, MD  butalbital-acetaminophen-caffeine (FIORICET, ESGIC) [id number] MG tablet Take 1-2 tablets by mouth every 6 (six) hours as needed for headache. 12/22/15 12/21/16  Rudene Re, MD  clidinium-chlordiazePOXIDE (LIBRAX) 5-2.5 MG capsule Take 1 capsule by mouth 2 (two) times daily as needed (ulcers).     Historical Provider, MD  cyanocobalamin (,VITAMIN B-12,) 1000 MCG/ML injection Inject 1 mL (1,000 mcg total) into the muscle once. Patient taking differently: Inject 1,000 mcg into the muscle every 7 (seven) days.  08/18/14   Charlynne Cousins, MD  escitalopram (LEXAPRO) 20 MG tablet Take 20 mg by mouth every morning.     Historical Provider, MD  famotidine (PEPCID) 40 MG tablet Take 40 mg by mouth daily as needed for heartburn or indigestion.    Historical Provider, MD  folic acid (FOLVITE) 1 MG tablet Take 1 mg by mouth daily.    Historical Provider, MD  lipase/protease/amylase (CREON) 36000 UNITS CPEP capsule Take [id number] Units by mouth as directed. Take 2 capsules before meals and 1 capsule before a single snack daily    Historical Provider, MD  loperamide (IMODIUM) 2 MG capsule Take 1 capsule (2 mg total) by mouth as needed for diarrhea or loose stools. 08/18/14   Charlynne Cousins, MD  LORazepam (ATIVAN) 1 MG tablet Take 1 mg by mouth 2 (two) times daily as needed for anxiety (anxiety).     Historical Provider, MD  metoCLOPramide (REGLAN) 10 MG tablet Take 1 tablet (10 mg total) by mouth 3 (three) times daily with meals. 12/22/15 12/21/16  Rudene Re, MD  Multiple Vitamin (MULITIVITAMIN WITH MINERALS) TABS Take 1 tablet by mouth every morning.     Historical Provider, MD    oxyCODONE-acetaminophen (ROXICET) 5-325 MG tablet Take 1 tablet by mouth every 4 (four) hours as needed for severe pain. 12/22/15   Rudene Re, MD  potassium chloride SA (K-DUR,KLOR-CON) 20 MEQ tablet Take 2 tablets (40 mEq total) by mouth 2 (two) times daily. Patient taking differently: Take 20 mEq by mouth 2 (two) times daily.  08/18/14   Charlynne Cousins, MD  promethazine (PHENERGAN) 25 MG tablet Take 1 tablet (25 mg total) by mouth every 6 (six) hours as needed for nausea or vomiting. 06/08/15   Sharlett Iles, MD  Vitamin D, Ergocalciferol, (DRISDOL) 50000 units CAPS capsule Take 50,000 Units by mouth every 7 (seven) days.    Historical Provider, MD  zolpidem (AMBIEN) 10 MG tablet Take 10 mg by mouth at bedtime as needed for sleep.    Historical Provider, MD    Allergies Prednisone; Amoxicillin; Butrans [buprenorphine]; Nsaids; and Zofran  Family History  Problem Relation Age of Onset  . Coronary artery disease Maternal Grandfather   . Prostate cancer Father     Social History Social History  Substance Use Topics  . Smoking status: Former Smoker    Packs/day: 0.25    Years: 30.00    Types:  Cigarettes    Quit date: 04/04/2015  . Smokeless tobacco: Never Used  . Alcohol use Yes     Comment: seldom    Review of Systems  Constitutional: Negative for fever. Eyes: Negative for visual changes. ENT: Negative for sore throat. Cardiovascular: Negative for chest pain. Respiratory: Negative for shortness of breath. Gastrointestinal: + abdominal pain, nausea, vomiting and diarrhea. Genitourinary: Negative for dysuria. Musculoskeletal: Negative for back pain. Skin: Negative for rash. Neurological: Negative for headaches, weakness or numbness.  ____________________________________________   PHYSICAL EXAM:  VITAL SIGNS: ED Triage Vitals  Enc Vitals Group     BP 12/22/15 1751 123/90     Pulse Rate 12/22/15 1751 98     Resp 12/22/15 1751 17     Temp 12/22/15  1751 98.3 F (36.8 C)     Temp Source 12/22/15 1751 Oral     SpO2 12/22/15 1751 97 %     Weight 12/22/15 1741 110 lb (49.9 kg)     Height 12/22/15 1741 5\' 3"  (1.6 m)     Head Circumference --      Peak Flow --      Pain Score 12/22/15 1742 7     Pain Loc --      Pain Edu? --      Excl. in [place]? --     Constitutional: Alert and oriented. Well appearing and in no apparent distress. HEENT:      Head: Normocephalic and atraumatic.         Eyes: Conjunctivae are normal. Sclera is non-icteric. EOMI. PERRL      Mouth/Throat: Mucous membranes are moist.       Neck: Supple with no signs of meningismus. Cardiovascular: Regular rate and rhythm. No murmurs, gallops, or rubs. 2+ symmetrical distal pulses are present in all extremities. No JVD. Respiratory: Normal respiratory effort. Lungs are clear to auscultation bilaterally. No wheezes, crackles, or rhonchi.  Gastrointestinal: Soft, Tender to palpation with guarding in the right lower quadrant, also tender to palpation in the epigastric region, non distended with positive bowel sounds. No rebound or guarding. Genitourinary: No CVA tenderness. Musculoskeletal: Nontender with normal range of motion in all extremities. No edema, cyanosis, or erythema of extremities. Neurologic: Normal speech and language. Face is symmetric. Moving all extremities. No gross focal neurologic deficits are appreciated. Skin: Skin is warm, dry and intact. No rash noted. Psychiatric: Mood and affect are normal. Speech and behavior are normal.  ____________________________________________   LABS (all labs ordered are listed, but only abnormal results are displayed)  Labs Reviewed  COMPREHENSIVE METABOLIC PANEL - Abnormal; Notable for the following:       Result Value   Potassium 2.4 (*)    CO2 19 (*)    AST 14 (*)    ALT 13 (*)    Alkaline Phosphatase 153 (*)    All other components within normal limits  CBC - Abnormal; Notable for the following:    MCV 103.3  (*)    MCH 36.3 (*)    All other components within normal limits  URINALYSIS COMPLETEWITH MICROSCOPIC ([facility] ONLY) - Abnormal; Notable for the following:    Color, Urine YELLOW (*)    APPearance HAZY (*)    Hgb urine dipstick 3+ (*)    Protein, ur 30 (*)    Squamous Epithelial / LPF 6-30 (*)    All other components within normal limits  C DIFFICILE QUICK SCREEN W PCR REFLEX  LIPASE, BLOOD  MAGNESIUM  TROPONIN I  ____________________________________________  EKG  ED ECG REPORT I, Rudene Re, the attending physician, personally viewed and interpreted this ECG.  18:45 - normal sinus rhythm, rate of 87, normal intervals, normal axis, ST depressions on inferior and lateral leads which are old from prior.  19:12 - normal sinus rhythm, rate of 88, normal intervals, normal axis, no STE,  ST depressions in inferior lateral leads which are old from prior.TE,  ____________________________________________  RADIOLOGY  CT a/P: 1. Moderate fluid throughout the colon typically seen with diarrhea. No colonic wall thickening or pericolonic inflammation. 2. Multiple bilateral renal calculi but no obstructing ureteral calculi or bladder calculi. 3. Intra and extrahepatic biliary dilatation likely due to prior cholecystectomy. ____________________________________________   PROCEDURES  Procedure(s) performed: None Procedures Critical Care performed:  None ____________________________________________   INITIAL IMPRESSION / ASSESSMENT AND PLAN / ED COURSE  50 y.o. female history of Addison's disease, anemia, anxiety depression, C. difficile diarrhea, collagenous colitis, IBS, hypokalemia, chronic pancreatitis, WPW status post ablation who presents for evaluation of abdominal pain 3 days located in her epigastric and right lower quadrant associated with nausea, vomiting, diarrhea. Vital signs are within normal limits, she is tender to palpation on both epigastric and right lower  quadrant regions. UA concerning for blood patient with history of kidney stone. Patient also has a history of chronic pancreatitis and colitis. We'll check stool for C. Difficile. Lipase is normal, normal LFTs, she is status post cholecystectomy. We'll get CT scan of her abdomen and pelvis. We'll treat symptoms with IV fluids, IV Zofran, IV morphine, we'll replete her potassium.  Clinical Course  Comment By Time  CT with no acute findings. C. Diff negative. Labs WNL. VS remain stable. Will discharge patient home with zofran and f/u with PCP. Rudene Re, MD 09/19 2228    Pertinent labs & imaging results that were available during my care of the patient were reviewed by me and considered in my medical decision making (see chart for details).    ____________________________________________   FINAL CLINICAL IMPRESSION(S) / ED DIAGNOSES  Final diagnoses:  Epigastric pain  Nausea vomiting and diarrhea  Hypokalemia      NEW MEDICATIONS STARTED DURING THIS VISIT:  Discharge Medication List as of 12/22/2015 10:32 PM    START taking these medications   Details  metoCLOPramide (REGLAN) 10 MG tablet Take 1 tablet (10 mg total) by mouth 3 (three) times daily with meals., Starting Tue 12/22/2015, Until Wed 12/21/2016, Print         Note:  This document was prepared using Dragon voice recognition software and may include unintentional dictation errors.    Rudene Re, MD 12/23/15 1234

## 2015-12-22 NOTE — ED Notes (Signed)
Pt unable to urinate at this moment, given specimen cup and sent out to [facility].

## 2015-12-22 NOTE — ED Notes (Signed)
Pt tolerating PO fluids

## 2015-12-22 NOTE — ED Notes (Signed)
Multiple attempts at IV access unsuccessful, including [place] IV.  MD Alfred Levins informed.

## 2015-12-22 NOTE — ED Notes (Signed)
MD at bedside. 

## 2015-12-24 ENCOUNTER — Emergency Department (HOSPITAL_BASED_OUTPATIENT_CLINIC_OR_DEPARTMENT_OTHER)
Admission: EM | Admit: 2015-12-24 | Discharge: 2015-12-24 | Disposition: A | Discharge status: Home / Self Care | Payer: BLUE CROSS/BLUE SHIELD | Attending: Emergency Medicine | Admitting: Emergency Medicine

## 2015-12-24 ENCOUNTER — Encounter (HOSPITAL_BASED_OUTPATIENT_CLINIC_OR_DEPARTMENT_OTHER): Payer: Self-pay | Admitting: Emergency Medicine

## 2015-12-24 DIAGNOSIS — Z87891 Personal history of nicotine dependence: Secondary | ICD-10-CM | POA: Diagnosis not present

## 2015-12-24 DIAGNOSIS — R1013 Epigastric pain: Secondary | ICD-10-CM | POA: Diagnosis present

## 2015-12-24 DIAGNOSIS — E876 Hypokalemia: Secondary | ICD-10-CM | POA: Insufficient documentation

## 2015-12-24 DIAGNOSIS — R197 Diarrhea, unspecified: Secondary | ICD-10-CM | POA: Diagnosis not present

## 2015-12-24 DIAGNOSIS — R112 Nausea with vomiting, unspecified: Secondary | ICD-10-CM | POA: Diagnosis not present

## 2015-12-24 LAB — COMPREHENSIVE METABOLIC PANEL
ALT: 13 U/L — ABNORMAL LOW (ref 14–54)
AST: 15 U/L (ref 15–41)
Albumin: 4.1 g/dL (ref 3.5–5.0)
Alkaline Phosphatase: 132 U/L — ABNORMAL HIGH (ref 38–126)
Anion gap: 9 (ref 5–15)
BUN: 12 mg/dL (ref 6–20)
CHLORIDE: 109 mmol/L (ref 101–111)
CO2: 22 mmol/L (ref 22–32)
CREATININE: 0.56 mg/dL (ref 0.44–1.00)
Calcium: 8.9 mg/dL (ref 8.9–10.3)
Glucose, Bld: 92 mg/dL (ref 65–99)
POTASSIUM: 2.3 mmol/L — AB (ref 3.5–5.1)
Sodium: 140 mmol/L (ref 135–145)
Total Bilirubin: 0.5 mg/dL (ref 0.3–1.2)
Total Protein: 6.8 g/dL (ref 6.5–8.1)

## 2015-12-24 LAB — CBC
HEMATOCRIT: 34.9 % — AB (ref 36.0–46.0)
Hemoglobin: 11.9 g/dL — ABNORMAL LOW (ref 12.0–15.0)
MCH: 36 pg — ABNORMAL HIGH (ref 26.0–34.0)
MCHC: 34.1 g/dL (ref 30.0–36.0)
MCV: 105.4 fL — AB (ref 78.0–100.0)
PLATELETS: 216 10*3/uL (ref 150–400)
RBC: 3.31 MIL/uL — AB (ref 3.87–5.11)
RDW: 12.9 % (ref 11.5–15.5)
WBC: 8.6 10*3/uL (ref 4.0–10.5)

## 2015-12-24 LAB — LIPASE, BLOOD: LIPASE: 16 U/L (ref 11–51)

## 2015-12-24 MED ORDER — SODIUM CHLORIDE 0.9 % IV BOLUS (SEPSIS)
500.0000 mL | Freq: Once | INTRAVENOUS | Status: AC
  Administered 2015-12-24: 500 mL via INTRAVENOUS

## 2015-12-24 MED ORDER — PROMETHAZINE HCL 25 MG/ML IJ SOLN
12.5000 mg | Freq: Once | INTRAMUSCULAR | Status: AC
  Administered 2015-12-24: 12.5 mg via INTRAVENOUS
  Filled 2015-12-24: qty 1

## 2015-12-24 MED ORDER — POTASSIUM CHLORIDE CRYS ER 20 MEQ PO TBCR
40.0000 meq | EXTENDED_RELEASE_TABLET | Freq: Once | ORAL | Status: AC
  Administered 2015-12-24: 40 meq via ORAL
  Filled 2015-12-24: qty 2

## 2015-12-24 MED ORDER — SODIUM CHLORIDE 0.9 % IV BOLUS (SEPSIS)
1000.0000 mL | Freq: Once | INTRAVENOUS | Status: AC
  Administered 2015-12-24: 1000 mL via INTRAVENOUS

## 2015-12-24 MED ORDER — POTASSIUM CHLORIDE CRYS ER 20 MEQ PO TBCR: 40.0000 meq | EXTENDED_RELEASE_TABLET | Freq: Two times a day (BID) | ORAL | 0 refills | Status: DC

## 2015-12-24 MED ORDER — POTASSIUM CHLORIDE 10 MEQ/100ML IV SOLN
10.0000 meq | Freq: Once | INTRAVENOUS | Status: AC
  Administered 2015-12-24: 10 meq via INTRAVENOUS
  Filled 2015-12-24: qty 100

## 2015-12-24 NOTE — ED Triage Notes (Signed)
Patient states that she "I am having N/V/D and low potassium and flair up of my colitis and pancreatitis. I am here from [place] and can not go back like this. They were unable to get an IV at [facility] and I feel miserable"

## 2015-12-24 NOTE — ED Provider Notes (Signed)
[facility] Provider Note   CSN: [id number] Arrival date & time: 12/24/15  1740  By signing my name below, I, Emmanuella Mensah, attest that this documentation has been prepared under the direction and in the presence of Davonna Belling, MD. Electronically Signed: Judithann Sauger, ED Scribe. 12/24/15. 7:02 PM.   History   Chief Complaint Chief Complaint  Patient presents with  . Abdominal Pain    HPI Comments: Nakishia Lacsamana is a 50 y.o. female with a hx of Addison's disease, C. diff diarrhea, colitis, chronic pancreatitis, IBS, and kidney stones who presents to the Emergency Department complaining of gradually worsening persistent multiple episodes of non-bloody vomiting and diarrhea onset 4 days ago. She reports associated moderate epigastric pain. She notes that these symptoms are consistent with her chronic Colitis and Pancreatitis. Pt was seen on 12/22/15 for these symptoms where she had normal labs and a normal CT abdomen and discharged with Reglan. She adds that she is here today because her symptoms are progressing and she is unable to keep her potassium medication down. No alleviating factors noted. She denies any known sick contacts. She also denies any recent medication changes. She reports that she cannot take NSAIDs, prednisone, and Zofran. She denies any fever, chills, melena, chest pain, or shortness of breath. She is here visiting from [place].   The history is provided by the patient. No language interpreter was used.  Abdominal Pain  Associated symptoms include diarrhea, nausea and vomiting. Pertinent negatives include fever.    Past Medical History:  Diagnosis Date  . Addison disease ([place])   . Anemia   . Anxiety   . Anxiety and depression   . Chronic back pain   . Chronic diarrhea   . Clostridium difficile diarrhea   . Collagenous colitis   . Depression   . Drug-seeking behavior   . Hypokalemia   . Irritable bowel syndrome (IBS)   . Kidney  stone   . Kidney stone   . Pancreatitis   . Thyroid disease   . Vaginal delivery [id number], 1998  . WPW (Wolff-Parkinson-White syndrome)     Patient Active Problem List   Diagnosis Date Noted  . Intractable nausea and vomiting 08/16/2014  . Macrocytic anemia 08/16/2014  . Hypokalemia, gastrointestinal losses 07/29/2014  . Peptic ulcer of duodenum 06/02/2014  . Epigastric pain 06/02/2014  . Nausea & vomiting 06/02/2014  . Insomnia 01/07/2014  . Migraine without aura 01/07/2014  . Post-operative state 10/28/2013  . Adenomyosis 08/08/2013  . Fibroids 08/08/2013  . Anxiety disorder 03/22/2012  . Panic disorder 03/22/2012  . Hypokalemia 01/30/2012  . Leukocytosis 01/30/2012  . Dehydration 01/30/2012  . Abdominal pain 01/06/2012  . Tobacco abuse 01/06/2012  . Hypersensitivity pneumonitis ([place]) 08/31/2011  . Chronic diarrhea 08/28/2011  . Collagenous colitis 08/28/2011  . Chest pain 08/28/2011  . Diarrhea 06/09/2011  . Esophagitis 02/11/2011  . Lymphocytic colitis 02/06/2011  . Adrenal insufficiency ([place]) 02/06/2011  . Hypothyroid 02/06/2011  . DEPRESSION 06/20/2010  . COSTOCHONDRITIS, ACUTE 03/14/2010  . 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 ACUTE GASTRITIS WITHOUT MENTION OF HEMORRHAGE 01/14/2009  . WEIGHT LOSS, RECENT 01/14/2009  . ANEMIA, MACROCYTIC, HX OF 01/14/2009    Past Surgical History:  Procedure Laterality Date  . ABDOMINAL HYSTERECTOMY  10/28/2013  . CARDIAC ELECTROPHYSIOLOGY MAPPING AND ABLATION     for WPW  . CHOLECYSTECTOMY    . ESOPHAGOGASTRODUODENOSCOPY (EGD) WITH PROPOFOL N/A 06/04/2014   Procedure: ESOPHAGOGASTRODUODENOSCOPY (EGD) WITH PROPOFOL;  Surgeon: Jeryl Columbia, MD;  Location: [facility];  Service: Endoscopy;  Laterality: N/A;  . LITHOTRIPSY    . SALPINGOOPHORECTOMY Bilateral 10/28/2013   Procedure: SALPINGO OOPHORECTOMY;  Surgeon: Emily Filbert, MD;  Location: [facility];  Service: Gynecology;  Laterality: Bilateral;  .  SPHINCTEROTOMY    . TUBAL LIGATION    . VAGINAL HYSTERECTOMY  10/28/2013   Procedure: HYSTERECTOMY VAGINAL;  Surgeon: Emily Filbert, MD;  Location: [facility];  Service: Gynecology;;  . VIDEO BRONCHOSCOPY  08/31/2011   Procedure: VIDEO BRONCHOSCOPY WITH FLUORO;  Surgeon: Elsie Stain, MD;  [facility];  Service: Cardiopulmonary;  Laterality: N/A;    OB History    Gravida Para Term Preterm AB Living   3 3 3     3    SAB TAB Ectopic Multiple Live Births                   Home Medications    Prior to Admission medications   Medication Sig Start Date End Date Taking? Authorizing Provider  acetaminophen (TYLENOL) 325 MG tablet Take 650 mg by mouth every 6 (six) hours as needed for mild pain or moderate pain (back pain).     Historical Provider, MD  budesonide (ENTOCORT EC) 3 MG 24 hr capsule Take 3 capsules (9 mg total) by mouth daily. 07/31/14   Verlee Monte, MD  butalbital-acetaminophen-caffeine (FIORICET, ESGIC) [id number] MG tablet Take 1-2 tablets by mouth every 6 (six) hours as needed for headache. 12/22/15 12/21/16  Rudene Re, MD  clidinium-chlordiazePOXIDE (LIBRAX) 5-2.5 MG capsule Take 1 capsule by mouth 2 (two) times daily as needed (ulcers).     Historical Provider, MD  cyanocobalamin (,VITAMIN B-12,) 1000 MCG/ML injection Inject 1 mL (1,000 mcg total) into the muscle once. Patient taking differently: Inject 1,000 mcg into the muscle every 7 (seven) days.  08/18/14   Charlynne Cousins, MD  escitalopram (LEXAPRO) 20 MG tablet Take 20 mg by mouth every morning.     Historical Provider, MD  famotidine (PEPCID) 40 MG tablet Take 40 mg by mouth daily as needed for heartburn or indigestion.    Historical Provider, MD  folic acid (FOLVITE) 1 MG tablet Take 1 mg by mouth daily.    Historical Provider, MD  lipase/protease/amylase (CREON) 36000 UNITS CPEP capsule Take [id number] Units by mouth as directed. Take 2 capsules before meals and 1 capsule before a single snack daily     Historical Provider, MD  loperamide (IMODIUM) 2 MG capsule Take 1 capsule (2 mg total) by mouth as needed for diarrhea or loose stools. 08/18/14   Charlynne Cousins, MD  LORazepam (ATIVAN) 1 MG tablet Take 1 mg by mouth 2 (two) times daily as needed for anxiety (anxiety).     Historical Provider, MD  metoCLOPramide (REGLAN) 10 MG tablet Take 1 tablet (10 mg total) by mouth 3 (three) times daily with meals. 12/22/15 12/21/16  Rudene Re, MD  Multiple Vitamin (MULITIVITAMIN WITH MINERALS) TABS Take 1 tablet by mouth every morning.     Historical Provider, MD  oxyCODONE-acetaminophen (ROXICET) 5-325 MG tablet Take 1 tablet by mouth every 4 (four) hours as needed for severe pain. 12/22/15   Rudene Re, MD  potassium chloride SA (K-DUR,KLOR-CON) 20 MEQ tablet Take 2 tablets (40 mEq total) by mouth 2 (two) times daily. 12/24/15   Davonna Belling, MD  promethazine (PHENERGAN) 25 MG tablet Take 1 tablet (25 mg total) by mouth every 6 (six) hours as needed for nausea or vomiting. 06/08/15   Wenda Overland  Little, MD  Vitamin D, Ergocalciferol, (DRISDOL) 50000 units CAPS capsule Take 50,000 Units by mouth every 7 (seven) days.    Historical Provider, MD  zolpidem (AMBIEN) 10 MG tablet Take 10 mg by mouth at bedtime as needed for sleep.    Historical Provider, MD    Family History Family History  Problem Relation Age of Onset  . Coronary artery disease Maternal Grandfather   . Prostate cancer Father     Social History Social History  Substance Use Topics  . Smoking status: Former Smoker    Packs/day: 0.25    Years: 30.00    Types: Cigarettes    Quit date: 04/04/2015  . Smokeless tobacco: Never Used  . Alcohol use Yes     Comment: seldom     Allergies   Prednisone; Amoxicillin; Butrans [buprenorphine]; Nsaids; and Zofran   Review of Systems Review of Systems  Constitutional: Negative for chills and fever.  Respiratory: Negative for shortness of breath.   Cardiovascular:  Negative for chest pain.  Gastrointestinal: Positive for abdominal pain, diarrhea, nausea and vomiting.  All other systems reviewed and are negative.    Physical Exam Updated Vital Signs BP 114/90 (BP Location: Left Arm)   Pulse 66   Temp 98.2 F (36.8 C) (Oral)   Resp 16   Ht 5\' 4"  (1.626 m)   Wt 110 lb (49.9 kg)   LMP 08/06/2013   SpO2 100%   BMI 18.88 kg/m   Physical Exam  Constitutional: She is oriented to person, place, and time. She appears well-developed and well-nourished. No distress.  HENT:  Head: Normocephalic and atraumatic.  Mucous membrane slightly dry  Eyes: Conjunctivae and EOM are normal.  Neck: Neck supple. No tracheal deviation present.  Cardiovascular: Normal rate.   Pulmonary/Chest: Effort normal. No respiratory distress.  Abdominal: There is tenderness.  Mild epigastric tenderness  Musculoskeletal: Normal range of motion.  Neurological: She is alert and oriented to person, place, and time.  Skin: Skin is warm and dry.  Psychiatric: She has a normal mood and affect. Her behavior is normal.  Nursing note and vitals reviewed.    ED Treatments / Results  DIAGNOSTIC STUDIES: Oxygen Saturation is 100% on RA, normal by my interpretation.    COORDINATION OF CARE: 6:37 PM- Pt advised of plan for treatment and pt agrees. Pt will receive lab work for further evaluation.    Labs (all labs ordered are listed, but only abnormal results are displayed) Labs Reviewed  COMPREHENSIVE METABOLIC PANEL - Abnormal; Notable for the following:       Result Value   Potassium 2.3 (*)    ALT 13 (*)    Alkaline Phosphatase 132 (*)    All other components within normal limits  CBC - Abnormal; Notable for the following:    RBC 3.31 (*)    Hemoglobin 11.9 (*)    HCT 34.9 (*)    MCV 105.4 (*)    MCH 36.0 (*)    All other components within normal limits  LIPASE, BLOOD    EKG  EKG Interpretation None       Radiology No results  found.  Procedures Procedures (including critical care time)  Medications Ordered in ED Medications  potassium chloride 10 mEq in 100 mL IVPB (0 mEq Intravenous Stopped 12/24/15 2145)  potassium chloride SA (K-DUR,KLOR-CON) CR tablet 40 mEq (40 mEq Oral Given 12/24/15 2044)  sodium chloride 0.9 % bolus 1,000 mL (0 mLs Intravenous Stopped 12/24/15 2145)  promethazine (PHENERGAN) injection  12.5 mg (12.5 mg Intravenous Given 12/24/15 1949)  promethazine (PHENERGAN) injection 12.5 mg (12.5 mg Intravenous Given 12/24/15 2039)  sodium chloride 0.9 % bolus 500 mL (0 mLs Intravenous Stopped 12/24/15 2145)     Initial Impression / Assessment and Plan / ED Course  Davonna Belling, MD has reviewed the triage vital signs and the nursing notes.  Pertinent labs & imaging results that were available during my care of the patient were reviewed by me and considered in my medical decision making (see chart for details).  Clinical Course    Patient nausea vomiting diarrhea. Seen at [facility] 2 days ago for same. CT scan done at that time was reassuring. Had hypokalemia however. Potassium is gone from 2.4-2.3. States she did not get her potassium filled. States she's continued to vomit. She has tolerated orals here. She would rather not be admitted. I think it is safe for discharge. She states she has antiemetics at home. Previous multiple workups for her abdominal pain and states she is just visiting from [place].  Final Clinical Impressions(s) / ED Diagnoses   Final diagnoses:  Hypokalemia  Nausea vomiting and diarrhea    New Prescriptions Current Discharge Medication List     I personally performed the services described in this documentation, which was scribed in my presence. The recorded information has been reviewed and is accurate.      Davonna Belling, MD 12/24/15 2238

## 2017-12-09 ENCOUNTER — Encounter: Payer: Self-pay | Admitting: Emergency Medicine

## 2017-12-09 ENCOUNTER — Emergency Department
Admission: EM | Admit: 2017-12-09 | Discharge: 2017-12-09 | Disposition: A | Discharge status: Home / Self Care | Payer: BLUE CROSS/BLUE SHIELD | Attending: Emergency Medicine | Admitting: Emergency Medicine

## 2017-12-09 ENCOUNTER — Emergency Department: Payer: BLUE CROSS/BLUE SHIELD

## 2017-12-09 DIAGNOSIS — Z87891 Personal history of nicotine dependence: Secondary | ICD-10-CM | POA: Insufficient documentation

## 2017-12-09 DIAGNOSIS — F13930 Sedative, hypnotic or anxiolytic use, unspecified with withdrawal, uncomplicated: Secondary | ICD-10-CM | POA: Diagnosis not present

## 2017-12-09 DIAGNOSIS — F13230 Sedative, hypnotic or anxiolytic dependence with withdrawal, uncomplicated: Secondary | ICD-10-CM

## 2017-12-09 DIAGNOSIS — F4329 Adjustment disorder with other symptoms: Secondary | ICD-10-CM | POA: Diagnosis not present

## 2017-12-09 DIAGNOSIS — R4182 Altered mental status, unspecified: Secondary | ICD-10-CM | POA: Diagnosis present

## 2017-12-09 DIAGNOSIS — E039 Hypothyroidism, unspecified: Secondary | ICD-10-CM | POA: Insufficient documentation

## 2017-12-09 DIAGNOSIS — Z79899 Other long term (current) drug therapy: Secondary | ICD-10-CM | POA: Insufficient documentation

## 2017-12-09 DIAGNOSIS — F329 Major depressive disorder, single episode, unspecified: Secondary | ICD-10-CM | POA: Diagnosis not present

## 2017-12-09 DIAGNOSIS — F419 Anxiety disorder, unspecified: Secondary | ICD-10-CM | POA: Insufficient documentation

## 2017-12-09 LAB — LIPASE, BLOOD: LIPASE: 19 U/L (ref 11–51)

## 2017-12-09 LAB — CBC WITH DIFFERENTIAL/PLATELET
Basophils Absolute: 0.1 10*3/uL (ref 0–0.1)
Basophils Relative: 1 %
Eosinophils Absolute: 0.9 10*3/uL — ABNORMAL HIGH (ref 0–0.7)
Eosinophils Relative: 8 %
HEMATOCRIT: 30.3 % — AB (ref 35.0–47.0)
Hemoglobin: 10 g/dL — ABNORMAL LOW (ref 12.0–16.0)
LYMPHS ABS: 2 10*3/uL (ref 1.0–3.6)
LYMPHS PCT: 17 %
MCH: 34.2 pg — ABNORMAL HIGH (ref 26.0–34.0)
MCHC: 33 g/dL (ref 32.0–36.0)
MCV: 103.8 fL — AB (ref 80.0–100.0)
MONO ABS: 0.7 10*3/uL (ref 0.2–0.9)
Monocytes Relative: 6 %
NEUTROS ABS: 8.2 10*3/uL — AB (ref 1.4–6.5)
Neutrophils Relative %: 68 %
Platelets: 224 10*3/uL (ref 150–440)
RBC: 2.92 MIL/uL — ABNORMAL LOW (ref 3.80–5.20)
RDW: 15.3 % — AB (ref 11.5–14.5)
WBC: 11.8 10*3/uL — ABNORMAL HIGH (ref 3.6–11.0)

## 2017-12-09 LAB — COMPREHENSIVE METABOLIC PANEL
ALT: 14 U/L (ref 0–44)
ANION GAP: 4 — AB (ref 5–15)
AST: 17 U/L (ref 15–41)
Albumin: 3.5 g/dL (ref 3.5–5.0)
Alkaline Phosphatase: 106 U/L (ref 38–126)
BUN: 10 mg/dL (ref 6–20)
CHLORIDE: 118 mmol/L — AB (ref 98–111)
CO2: 22 mmol/L (ref 22–32)
Calcium: 8.5 mg/dL — ABNORMAL LOW (ref 8.9–10.3)
Creatinine, Ser: 0.81 mg/dL (ref 0.44–1.00)
Glucose, Bld: 83 mg/dL (ref 70–99)
Potassium: 3.4 mmol/L — ABNORMAL LOW (ref 3.5–5.1)
Sodium: 144 mmol/L (ref 135–145)
TOTAL PROTEIN: 6.2 g/dL — AB (ref 6.5–8.1)
Total Bilirubin: 0.6 mg/dL (ref 0.3–1.2)

## 2017-12-09 LAB — ETHANOL

## 2017-12-09 LAB — TSH: TSH: 0.576 u[IU]/mL (ref 0.350–4.500)

## 2017-12-09 LAB — ACETAMINOPHEN LEVEL: Acetaminophen (Tylenol), Serum: <10 ug/mL — ABNORMAL LOW (ref 10–30)

## 2017-12-09 LAB — LACTIC ACID, PLASMA
Lactic Acid, Venous: 0.8 mmol/L (ref 0.5–1.9)
Lactic Acid, Venous: 0.8 mmol/L (ref 0.5–1.9)

## 2017-12-09 LAB — AMMONIA: Ammonia: 22 umol/L (ref 9–35)

## 2017-12-09 LAB — SALICYLATE LEVEL

## 2017-12-09 LAB — TROPONIN I

## 2017-12-09 MED ORDER — ACETAMINOPHEN 325 MG PO TABS
650.0000 mg | ORAL_TABLET | Freq: Once | ORAL | Status: AC
  Administered 2017-12-09: 650 mg via ORAL
  Filled 2017-12-09: qty 2

## 2017-12-09 NOTE — ED Notes (Signed)
Patient has been sleeping did wake  Up to talk to TTS, patient talked to nurse when she took tray in and states " I hope I can leave here soon, I just want to go home' she denies Si/hi or avh. Will continue to monitor , camera surveillance in progress for safety.

## 2017-12-09 NOTE — ED Provider Notes (Signed)
Patient was cleared by psychiatry for discharge.   Earleen Newport, MD 12/09/17 [id number]

## 2017-12-09 NOTE — ED Notes (Signed)
Patient moved to room one from the quad, nurse received report from Amy RN in quad prior to transfer, Patient received breakfast tray, and is calm and cooperative.

## 2017-12-09 NOTE — ED Notes (Signed)
Awakened pt and introduced myself to her  - plan of care discussed including pending psych evaluation and plan to move to another area where she will have a room  Pt verbalized agreement   Breakfast provided   She is voluntary   [place] Patient sleeping: No. Patient alert and oriented: yes Behavior appropriate: Yes.  ; If no, describe:  Nutrition and fluids offered: yes Toileting and hygiene offered: Yes  Sitter present: q15 minute observations and security monitoring Law enforcement present: Yes  ODS  ENVIRONMENTAL ASSESSMENT Potentially harmful objects out of patient reach: Yes.   Personal belongings secured: Yes.   Patient dressed in hospital provided attire only: Yes.   Plastic bags out of patient reach: Yes.   Patient care equipment (cords, cables, call bells, lines, and drains) shortened, removed, or accounted for: Yes.   Equipment and supplies removed from bottom of stretcher: Yes.   Potentially toxic materials out of patient reach: Yes.   Sharps container removed or out of patient reach: Yes.

## 2017-12-09 NOTE — BH Assessment (Signed)
Assessment Note  Dawn Frost is an 52 y.o. female who presents to ED because "my mother told me to", per triage note. When this Probation officer assessed pt she reported she was not aware of why she was brought to the hospital. She reported that she is currently in town for her daughter's wedding. Pt reported "I've been feeling strange and weird stuff has been happening ... I had a bad UTI". She also reports experiencing visual hallucinations "once or twice" within the last week. Pt unable to recall this visual hallucination. She states she lives in [place], [place] with her husband and her son Dawn Frost). Pt was a poor historian and could not provide any information about the events that resulted to her being admitted to the ED. She reports she is currently prescribed Cymbalta and Ativan by her General Physician. Pt unable to report timeline of symptoms. Pt denied SI/HI/AH. She denied past/current substance use.  This Probation officer gathered more information from pt's daughter, who reports: "2 weeks ago she got a UTI and she started becoming confused. My family thinks she has sundowners. Every evening, for the past 2 weeks or so (around 7:00pm-7:30pm), she has been taken down pictures and curtains saying she's 'getting ready for a baby boy' ... We don't have any baby boys in our family. She has also been looking out the window saying 'that's a pretty playground' when there is no playground in sight".   Pt's family is reporting bizarre and unusual behavior exhibited by patient. And reports this behavior is new for her. Pt also has history of low potassium and Crohn's disease.   Diagnosis:  Unspecified Depressive Disorder Unspecified Anxiety Disorder  Past Medical History:  Past Medical History:  Diagnosis Date  . Addison disease ([place])   . Anemia   . Anxiety   . Anxiety and depression   . Chronic back pain   . Chronic diarrhea   . Clostridium difficile diarrhea   . Collagenous colitis   . Depression   .  Drug-seeking behavior   . Hypokalemia   . Irritable bowel syndrome (IBS)   . Kidney stone   . Kidney stone   . Pancreatitis   . Thyroid disease   . Vaginal delivery [id number], 1998  . WPW (Wolff-Parkinson-White syndrome)     Past Surgical History:  Procedure Laterality Date  . ABDOMINAL HYSTERECTOMY  10/28/2013  . CARDIAC ELECTROPHYSIOLOGY MAPPING AND ABLATION     for WPW  . CHOLECYSTECTOMY    . ESOPHAGOGASTRODUODENOSCOPY (EGD) WITH PROPOFOL N/A 06/04/2014   Procedure: ESOPHAGOGASTRODUODENOSCOPY (EGD) WITH PROPOFOL;  Surgeon: Jeryl Columbia, MD;  [facility];  Service: Endoscopy;  Laterality: N/A;  . LITHOTRIPSY    . SALPINGOOPHORECTOMY Bilateral 10/28/2013   Procedure: SALPINGO OOPHORECTOMY;  Surgeon: Emily Filbert, MD;  Location: [facility];  Service: Gynecology;  Laterality: Bilateral;  . SPHINCTEROTOMY    . TUBAL LIGATION    . VAGINAL HYSTERECTOMY  10/28/2013   Procedure: HYSTERECTOMY VAGINAL;  Surgeon: Emily Filbert, MD;  Location: [facility];  Service: Gynecology;;  . VIDEO BRONCHOSCOPY  08/31/2011   Procedure: VIDEO BRONCHOSCOPY WITH FLUORO;  Surgeon: Elsie Stain, MD;  [facility];  Service: Cardiopulmonary;  Laterality: N/A;    Family History:  Family History  Problem Relation Age of Onset  . Coronary artery disease Maternal Grandfather   . Prostate cancer Father     Social History:  reports that she quit smoking about 2 years ago. Her smoking use included cigarettes. She has a  7.50 pack-year smoking history. She has never used smokeless tobacco. She reports that she drinks alcohol. She reports that she does not use drugs.  Additional Social History:  Alcohol / Drug Use Pain Medications: See MAR Prescriptions: See MAR Over the Counter: See MAR History of alcohol / drug use?: No history of alcohol / drug abuse Longest period of sobriety (when/how long): None  CIWA:   COWS:    Allergies:  Allergies  Allergen Reactions  . Prednisone Other (See Comments)     Hyperactivity and agitation  . Amoxicillin     Pt states she had a bad reaction to amox Has patient had a PCN reaction causing immediate rash, facial/tongue/throat swelling, SOB or lightheadedness with hypotension: unknown Has patient had a PCN reaction causing severe rash involving mucus membranes or skin necrosis: unknown Has patient had a PCN reaction that required hospitalization unknown Has patient had a PCN reaction occurring within the last 10 years: No If all of the above answers are "NO", then may proceed with Cephalosporin use.   Dawn Frost [Buprenorphine] Other (See Comments)    Burned skin  . Nsaids Other (See Comments)    Bad for colitis  . Zofran Other (See Comments)    Headache     Home Medications:  (Not in a hospital admission)  OB/GYN Status:  Patient's last menstrual period was 08/06/2013.  General Assessment Data Location of Assessment[facility] ED TTS Assessment: In system Is this a Tele or Face-to-Face Assessment?: Face-to-Face Is this an Initial Assessment or a Re-assessment for this encounter?: Initial Assessment Patient Accompanied by:: N/A Language Other than English: No Living Arrangements: Other (Comment)(With husband and son (73yo)) What gender do you identify as?: Female Marital status: Married Pharmacist, community name: Dawn Frost Pregnancy Status: No Living Arrangements: Spouse/significant other, Children(In [place]) Can pt return to current living arrangement?: Yes Admission Status: Voluntary Is patient capable of signing voluntary admission?: Yes Referral Source: Self/Family/Friend Insurance type: Insurance risk surveyor Exam ([place]) Medical Exam completed: Yes  Crisis Care Plan Living Arrangements: Spouse/significant other, Children(In [place]) Legal Guardian: Other:(Self) Name of Psychiatrist: None Name of Therapist: None  Education Status Is patient currently in school?: No Is the patient employed, unemployed or receiving disability?:  Unemployed  Risk to self with the past 6 months Suicidal Ideation: No Has patient been a risk to self within the past 6 months prior to admission? : No Suicidal Intent: No Has patient had any suicidal intent within the past 6 months prior to admission? : No Is patient at risk for suicide?: No Suicidal Plan?: No Has patient had any suicidal plan within the past 6 months prior to admission? : No Access to Means: No What has been your use of drugs/alcohol within the last 12 months?: None Reported Previous Attempts/Gestures: No How many times?: 0 Other Self Harm Risks: None Triggers for Past Attempts: None known Intentional Self Injurious Behavior: None Family Suicide History: Unknown Recent stressful life event(s): Recent negative physical changes(UTI) Persecutory voices/beliefs?: No Depression: Yes Depression Symptoms: Despondent, Insomnia Substance abuse history and/or treatment for substance abuse?: No Suicide prevention information given to non-admitted patients: Not applicable  Risk to Others within the past 6 months Homicidal Ideation: No Does patient have any lifetime risk of violence toward others beyond the six months prior to admission? : No Thoughts of Harm to Others: No Current Homicidal Intent: No Current Homicidal Plan: No Access to Homicidal Means: No Identified Victim: None History of harm to others?: No Assessment of Violence:  None Noted Violent Behavior Description: None Does patient have access to weapons?: No Criminal Charges Pending?: No Does patient have a court date: No Is patient on probation?: No  Psychosis Hallucinations: Visual("1 or 2 times within the last week") Delusions: Unspecified  Mental Status Report Appearance/Hygiene: In scrubs Eye Contact: Fair Motor Activity: Freedom of movement Speech: Logical/coherent Level of Consciousness: Alert Mood: Depressed Affect: Depressed Anxiety Level: Moderate Thought Processes: Coherent,  Relevant Judgement: Unimpaired Orientation: Person, Place, Time, Situation Obsessive Compulsive Thoughts/Behaviors: None  Cognitive Functioning Concentration: Normal Memory: Recent Intact, Remote Intact Is patient IDD: No Insight: Fair Impulse Control: Fair Appetite: Good Have you had any weight changes? : No Change Sleep: Decreased Total Hours of Sleep: 2(2-3 hours) Vegetative Symptoms: None  ADLScreening [facility] Assessment Services) Patient's cognitive ability adequate to safely complete daily activities?: Yes Patient able to express need for assistance with ADLs?: Yes Independently performs ADLs?: Yes (appropriate for developmental age)  Prior Inpatient Therapy Prior Inpatient Therapy: No  Prior Outpatient Therapy Prior Outpatient Therapy: No Does patient have an ACCT team?: No Does patient have Intensive In-House Services?  : No Does patient have [facility] services? : No Does patient have P4CC services?: No  ADL Screening (condition at time of admission) Patient's cognitive ability adequate to safely complete daily activities?: Yes Patient able to express need for assistance with ADLs?: Yes Independently performs ADLs?: Yes (appropriate for developmental age)       Abuse/Neglect Assessment (Assessment to be complete while patient is alone) Abuse/Neglect Assessment Can Be Completed: Yes Physical Abuse: Denies Verbal Abuse: Denies Sexual Abuse: Denies Exploitation of patient/patient's resources: Denies Self-Neglect: Denies Values / Beliefs Cultural Requests During Hospitalization: None Spiritual Requests During Hospitalization: None Consults Spiritual Care Consult Needed: No Social Work Consult Needed: No         Child/Adolescent Assessment Running Away Risk: (Patient is an adult)  Disposition:  Disposition Initial Assessment Completed for this Encounter: Yes Disposition of Patient: (Pending SOC)  On Site Evaluation by:   Reviewed with Physician:     Frederich Cha 12/09/2017 1:15 PM

## 2017-12-09 NOTE — ED Notes (Signed)
Patient alert, oriented and ambulatory at discharge. Patient AVS/Follow-up Care/Medications  reviewed with her and written copy given to patient. Patient given suicide Prevention Lifeline given to patient and written number given to patient. Patient verbalized understanding. Patient vitals at discharge 98.4-148/90-98-20-100% room air. Patient left with mother Remo Lipps. Patient escorted to lobby by this Probation officer to meet mom.

## 2017-12-09 NOTE — ED Notes (Signed)
Patient observed lying in a hallway bed with eyes closed  Even, unlabored respirations observed   NAD pt appears to be sleeping  I will continue to monitor along with every 15 minute visual observations and ongoing security monitoring

## 2017-12-09 NOTE — ED Triage Notes (Signed)
Pt. States "I am here because My mother told me to".

## 2017-12-09 NOTE — ED Provider Notes (Signed)
[facility] Emergency Department Provider Note   ____________________________________________   First MD Initiated Contact with Patient 12/09/17 [phone]     (approximate)  I have reviewed the triage vital signs and the nursing notes.   HISTORY  Chief Complaint Altered Mental Status  History limited by patient does not know why she is here.  HPI Dawn Frost is a 52 y.o. female brought to the ED from home via EMS "because my mother told me to".  Reportedly patient was found to be delusional and tearing curtains off the windows.  Patient voices no complaints.  Specifically, denies recent fever, chills, chest pain, shortness of breath, abdominal pain, nausea or vomiting.  Does relate chronic back pain.   Past Medical History:  Diagnosis Date  . Addison disease ([place])   . Anemia   . Anxiety   . Anxiety and depression   . Chronic back pain   . Chronic diarrhea   . Clostridium difficile diarrhea   . Collagenous colitis   . Depression   . Drug-seeking behavior   . Hypokalemia   . Irritable bowel syndrome (IBS)   . Kidney stone   . Kidney stone   . Pancreatitis   . Thyroid disease   . Vaginal delivery [id number], 1998  . WPW (Wolff-Parkinson-White syndrome)     Patient Active Problem List   Diagnosis Date Noted  . Intractable nausea and vomiting 08/16/2014  . Macrocytic anemia 08/16/2014  . Hypokalemia, gastrointestinal losses 07/29/2014  . Peptic ulcer of duodenum 06/02/2014  . Epigastric pain 06/02/2014  . Nausea & vomiting 06/02/2014  . Insomnia 01/07/2014  . Migraine without aura 01/07/2014  . Post-operative state 10/28/2013  . Adenomyosis 08/08/2013  . Fibroids 08/08/2013  . Anxiety disorder 03/22/2012  . Panic disorder 03/22/2012  . Hypokalemia 01/30/2012  . Leukocytosis 01/30/2012  . Dehydration 01/30/2012  . Abdominal pain 01/06/2012  . Tobacco abuse 01/06/2012  . Hypersensitivity pneumonitis ([place]) 08/31/2011  . Chronic diarrhea  08/28/2011  . Collagenous colitis 08/28/2011  . Chest pain 08/28/2011  . Diarrhea 06/09/2011  . Esophagitis 02/11/2011  . Lymphocytic colitis 02/06/2011  . Adrenal insufficiency ([place]) 02/06/2011  . Hypothyroid 02/06/2011  . DEPRESSION 06/20/2010  . COSTOCHONDRITIS, ACUTE 03/14/2010  . 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 ACUTE GASTRITIS WITHOUT MENTION OF HEMORRHAGE 01/14/2009  . WEIGHT LOSS, RECENT 01/14/2009  . ANEMIA, MACROCYTIC, HX OF 01/14/2009    Past Surgical History:  Procedure Laterality Date  . ABDOMINAL HYSTERECTOMY  10/28/2013  . CARDIAC ELECTROPHYSIOLOGY MAPPING AND ABLATION     for WPW  . CHOLECYSTECTOMY    . ESOPHAGOGASTRODUODENOSCOPY (EGD) WITH PROPOFOL N/A 06/04/2014   Procedure: ESOPHAGOGASTRODUODENOSCOPY (EGD) WITH PROPOFOL;  Surgeon: Jeryl Columbia, MD;  Location: [facility];  Service: Endoscopy;  Laterality: N/A;  . LITHOTRIPSY    . SALPINGOOPHORECTOMY Bilateral 10/28/2013   Procedure: SALPINGO OOPHORECTOMY;  Surgeon: Emily Filbert, MD;  Location: [facility];  Service: Gynecology;  Laterality: Bilateral;  . SPHINCTEROTOMY    . TUBAL LIGATION    . VAGINAL HYSTERECTOMY  10/28/2013   Procedure: HYSTERECTOMY VAGINAL;  Surgeon: Emily Filbert, MD;  Location: [facility];  Service: Gynecology;;  . VIDEO BRONCHOSCOPY  08/31/2011   Procedure: VIDEO BRONCHOSCOPY WITH FLUORO;  Surgeon: Elsie Stain, MD;  Location: [facility];  Service: Cardiopulmonary;  Laterality: N/A;    Prior to Admission medications   Medication Sig Start Date End Date Taking? Authorizing Provider  DULoxetine (CYMBALTA) 60 MG capsule Take 120 mg by mouth  daily. 11/26/17  Yes [provider]  LORazepam (ATIVAN) 0.5 MG tablet Take 0.5 mg by mouth daily. 12/09/17 12/13/17 Yes [provider]  pantoprazole (PROTONIX) 40 MG tablet Take 40 mg by mouth 2 (two) times daily. 10/24/17  Yes [provider]  potassium chloride SA (K-DUR,KLOR-CON) 20 MEQ tablet Take 40 mEq by  mouth 3 (three) times daily.   Yes [provider]  promethazine (PHENERGAN) 25 MG tablet Take 25 mg by mouth every 8 (eight) hours as needed for nausea or vomiting.   Yes [provider]  cyanocobalamin (,VITAMIN B-12,) 1000 MCG/ML injection Inject 1 mL (1,000 mcg total) into the muscle once. Patient not taking: Reported on 12/09/2017 08/18/14   Charlynne Cousins, MD  loperamide (IMODIUM) 2 MG capsule Take 1 capsule (2 mg total) by mouth as needed for diarrhea or loose stools. Patient not taking: Reported on 12/09/2017 08/18/14   Charlynne Cousins, MD  metoCLOPramide (REGLAN) 10 MG tablet Take 1 tablet (10 mg total) by mouth 3 (three) times daily with meals. 12/22/15 12/21/16  Rudene Re, MD  oxyCODONE-acetaminophen (ROXICET) 5-325 MG tablet Take 1 tablet by mouth every 4 (four) hours as needed for severe pain. Patient not taking: Reported on 12/09/2017 12/22/15   Rudene Re, MD  potassium chloride SA (K-DUR,KLOR-CON) 20 MEQ tablet Take 2 tablets (40 mEq total) by mouth 2 (two) times daily. Patient not taking: Reported on 12/09/2017 12/24/15   Davonna Belling, MD  promethazine (PHENERGAN) 25 MG tablet Take 1 tablet (25 mg total) by mouth every 6 (six) hours as needed for nausea or vomiting. Patient not taking: Reported on 12/09/2017 06/08/15   Little, Wenda Overland, MD    Allergies Prednisone; Amoxicillin; Butrans [buprenorphine]; Nsaids; and Zofran  Family History  Problem Relation Age of Onset  . Coronary artery disease Maternal Grandfather   . Prostate cancer Father     Social History Social History   Tobacco Use  . Smoking status: Former Smoker    Packs/day: 0.25    Years: 30.00    Pack years: 7.50    Types: Cigarettes    Last attempt to quit: 04/04/2015    Years since quitting: 2.6  . Smokeless tobacco: Never Used  Substance Use Topics  . Alcohol use: Yes    Comment: seldom  . Drug use: No    Types: Barbituates    Review of  Systems  Constitutional: No fever/chills Eyes: No visual changes. ENT: No sore throat. Cardiovascular: Denies chest pain. Respiratory: Denies shortness of breath. Gastrointestinal: No abdominal pain.  No nausea, no vomiting.  No diarrhea.  No constipation. Genitourinary: Negative for dysuria. Musculoskeletal: Positive for chronic back pain. Skin: Negative for rash. Neurological: Negative for headaches, focal weakness or numbness. Psychiatric:Positive for bizarre behavior.  Negative for SI/HI/AH/VH.  ____________________________________________   PHYSICAL EXAM:  VITAL SIGNS: ED Triage Vitals  Enc Vitals Group     BP      Pulse      Resp      Temp      Temp src      SpO2      Weight      Height      Head Circumference      Peak Flow      Pain Score      Pain Loc      Pain Edu?      Excl. in [place]?     Constitutional: Alert and oriented.  Disheveled appearing and in no acute  distress. Eyes: Conjunctivae are normal. PERRL. EOMI. Head: Atraumatic. Nose: No external evidence of injury. Mouth/Throat: Mucous membranes are moist.  No dental malocclusion. Neck: No stridor.  No cervical spine tenderness to palpation. Cardiovascular: Normal rate, regular rhythm. Grossly normal heart sounds.  Good peripheral circulation. Respiratory: Normal respiratory effort.  No retractions. Lungs CTAB. Gastrointestinal: Soft and nontender. No distention. No abdominal bruits. No CVA tenderness. Musculoskeletal: No lower extremity tenderness nor edema.  No joint effusions. Neurologic:  Normal speech and language. No gross focal neurologic deficits are appreciated. No gait instability. Skin:  Skin is warm, dry and intact. No rash noted. Psychiatric: Mood and affect are normal. Speech and behavior are normal.  ____________________________________________   LABS (all labs ordered are listed, but only abnormal results are displayed)  Labs Reviewed  CBC WITH DIFFERENTIAL/PLATELET - Abnormal;  Notable for the following components:      Result Value   WBC 11.8 (*)    RBC 2.92 (*)    Hemoglobin 10.0 (*)    HCT 30.3 (*)    MCV 103.8 (*)    MCH 34.2 (*)    RDW 15.3 (*)    Neutro Abs 8.2 (*)    Eosinophils Absolute 0.9 (*)    All other components within normal limits  COMPREHENSIVE METABOLIC PANEL - Abnormal; Notable for the following components:   Potassium 3.4 (*)    Chloride 118 (*)    Calcium 8.5 (*)    Total Protein 6.2 (*)    Anion gap 4 (*)    All other components within normal limits  ACETAMINOPHEN LEVEL - Abnormal; Notable for the following components:   Acetaminophen (Tylenol), Serum <10 (*)    All other components within normal limits  SALICYLATE LEVEL  ETHANOL  LIPASE, BLOOD  TROPONIN I  LACTIC ACID, PLASMA  LACTIC ACID, PLASMA  AMMONIA  TSH  URINALYSIS, COMPLETE (UACMP) WITH MICROSCOPIC  URINE DRUG SCREEN, QUALITATIVE ([facility] ONLY)   ____________________________________________  EKG  ED ECG REPORT I, Mellody Masri J, the attending physician, personally viewed and interpreted this ECG.   Date: 12/09/2017  EKG Time: 0602  Rate: 100  Rhythm: normal EKG, normal sinus rhythm  Axis: Normal  Intervals:none  ST&T Change: Nonspecific  ____________________________________________  RADIOLOGY  ED MD interpretation: None  Official radiology report(s): Ct Head Wo Contrast  Result Date: 12/09/2017 CLINICAL DATA:  Headache, nausea and vomiting. EXAM: CT HEAD WITHOUT CONTRAST TECHNIQUE: Contiguous axial images were obtained from the base of the skull through the vertex without intravenous contrast. COMPARISON:  Head CT 12/30/2010 FINDINGS: BRAIN: The ventricles and sulci are normal. No intraparenchymal hemorrhage, mass effect nor midline shift. No acute large vascular territory infarcts. Grey-white matter distinction is maintained. The basal ganglia are unremarkable. No abnormal extra-axial fluid collections. Basal cisterns are not effaced and midline. The  brainstem and cerebellar hemispheres are without acute abnormalities. VASCULAR: No hyperdense vessel sign or unexpected calcifications. SKULL/SOFT TISSUES: No skull fracture. No significant soft tissue swelling. ORBITS/SINUSES: The included ocular globes and orbital contents are normal.The mastoid air cells are clear. The included paranasal sinuses are well-aerated. OTHER: None. IMPRESSION: Normal head CT. Electronically Signed   By: Ashley Royalty M.D.   On: 12/09/2017 03:29    ____________________________________________   PROCEDURES  Procedure(s) performed: None  Procedures  Critical Care performed: No  ____________________________________________   INITIAL IMPRESSION / ASSESSMENT AND PLAN / ED COURSE  As part of my medical decision making, I reviewed the following data within the [facility]  notes reviewed and incorporated, Labs reviewed, Old chart reviewed, A consult was requested and obtained from this/these consultant(s) Psychiatry and Notes from prior ED visits   52 year old female sent by her mother via EMS for "delusions" and bizarre behavior which include tearing curtains off the wall which patient denies.  Currently patient is cooperative and does not present a danger to herself or others.  Will keep patient under voluntary status pending tele-psychiatry evaluation and disposition.  Clinical Course as of Dec 10 642  Sat Dec 09, 2017  [id number] Parents provided more information to the nurse which include that patient just arrived from [place] 2 days ago.  Reportedly patient is actively being weaned from benzodiazepines by her PCP.  Also recently had a UTI.  Last year she was diagnosed with possible early onset dementia.  Parents state patient's behavior worsens in the evening time.  Patient currently asleep and she has been cooperative while in the emergency department.  Will keep patient under voluntary status pending psychiatric evaluation and disposition.    [JS]    Clinical Course User Index [JS] Paulette Blanch, MD     ____________________________________________   FINAL CLINICAL IMPRESSION(S) / ED DIAGNOSES  Final diagnoses:  Adjustment disorder with other symptom  Benzodiazepine withdrawal without complication [facility])     ED Discharge Orders    None       Note:  This document was prepared using Dragon voice recognition software and may include unintentional dictation errors.    Paulette Blanch, MD 12/09/17 [phone]

## 2017-12-09 NOTE — ED Notes (Signed)
Patient offered breakfast, but did not eat at this time.

## 2017-12-09 NOTE — ED Provider Notes (Signed)
I spoke verbally by phone with a psychiatrist who recommends the patient be discharged home.  His impression was delirium secondary to over-the-counter sleep aid.  I have completed patient discharge instructions with regulation for behavioral health as well as primary care follow-up.   Lisa Roca, MD 12/09/17 [phone]

## 2017-12-09 NOTE — ED Notes (Signed)
Pt changed out into behavioral clothing by this tech, pt belongings placed in belongings bag and handed off to [employer]. Belongings collected are as follows...Marland KitchenMarland KitchenMarland Kitchen  1 white tank top, 1 white colored t-shirt, 1 red pair of fleece pants, 1 white shoe, (pt couldn't recall where the match was)

## 2017-12-09 NOTE — Discharge Instructions (Addendum)
Return to the emergency department immediately for any new or worsening confusion or altered mental status, concern for overdose, depression or thoughts of harming yourself or others.

## 2017-12-09 NOTE — ED Notes (Signed)
Patient alert, oriented and ambulatory. Patient denies SI/HI and A/V hallucinations. Patient is calm and cooperative. Patient is resting in room with eyes open on bed. Respirations are even and non labored. No distress noted. Q 15 minute checks in progress and patient remains safe on unit. Monitoring continues.

## 2017-12-09 NOTE — ED Notes (Signed)
Spoke with patients parents in Mother(Joan) [phone] Father([phone].  They stated she is visiting from [place], and arrived in [place] 2 days ago.  Parents states both nights patient has gotten up during the night and broken things in house.  Patient also reports seeing things the rest of the family was not seeing.  Mother states patient had been on ativan but is being tampered off by primary doctor.  Mother also states patient had UTI, but was treated.  Mother states patients husbands # who is still in [place] could give more information about patient. [phone].

## 2020-04-04 DIAGNOSIS — U071 COVID-19: Secondary | ICD-10-CM

## 2020-04-04 DIAGNOSIS — J1282 Pneumonia due to coronavirus disease 2019: Secondary | ICD-10-CM

## 2020-04-04 HISTORY — DX: COVID-19: U07.1

## 2020-04-04 HISTORY — DX: Pneumonia due to coronavirus disease 2019: J12.82

## 2020-10-07 ENCOUNTER — Encounter: Payer: Self-pay | Admitting: Emergency Medicine

## 2020-10-07 ENCOUNTER — Other Ambulatory Visit: Payer: Self-pay

## 2020-10-07 ENCOUNTER — Emergency Department (INDEPENDENT_AMBULATORY_CARE_PROVIDER_SITE_OTHER)
Admission: EM | Admit: 2020-10-07 | Discharge: 2020-10-07 | Disposition: A | Discharge status: Home / Self Care | Payer: BC Managed Care – PPO | Source: Home / Self Care

## 2020-10-07 ENCOUNTER — Emergency Department (INDEPENDENT_AMBULATORY_CARE_PROVIDER_SITE_OTHER): Payer: BC Managed Care – PPO

## 2020-10-07 DIAGNOSIS — R059 Cough, unspecified: Secondary | ICD-10-CM | POA: Diagnosis not present

## 2020-10-07 MED ORDER — METHYLPREDNISOLONE 4 MG PO TBPK: ORAL_TABLET | ORAL | 0 refills | Status: DC

## 2020-10-07 MED ORDER — BENZONATATE 200 MG PO CAPS: 200.0000 mg | ORAL_CAPSULE | Freq: Three times a day (TID) | ORAL | 0 refills | Status: AC | PRN

## 2020-10-07 NOTE — Discharge Instructions (Addendum)
Advised patient take medication as directed with food to completion.  Advised patient may use Tessalon Perles daily, as needed for cough encourage patient to smoke less and encourage increase daily water intake.

## 2020-10-07 NOTE — ED Provider Notes (Signed)
[facility]    CSN: [id number] Arrival date & time: 10/07/20  1233      History   Chief Complaint Chief Complaint  Patient presents with   Cough    HPI Dawn Frost is a 55 y.o. female.   HPI patient presents with cough and sore throat for 3-4 days and reports visiting here from [place] arrived on 09/29/2020.  Patient reports PMH of pneumonia and was hospitalized with COVID-19/pneumonia in January 2021.  PMH significant for WPW, hypersensitivity pneumonitis, and tobacco use/nicotine dependence reports smoking half pack per day.   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[place])    Anemia    Anxiety    Anxiety and depression    Chronic back pain    Chronic diarrhea    Clostridium difficile diarrhea    Collagenous colitis    Depression    Drug-seeking behavior    Hypokalemia    Irritable bowel syndrome (IBS)    Kidney stone    Kidney stone    Pancreatitis    Pneumonia due to COVID-19 virus 04/2020   hospitalized in [place]   Thyroid disease    Vaginal delivery [id number], 1998   WPW (Wolff-Parkinson-White syndrome)     Patient Active Problem List   Diagnosis Date Noted   Intractable nausea and vomiting 08/16/2014   Macrocytic anemia 08/16/2014   Hypokalemia, gastrointestinal losses 07/29/2014   Peptic ulcer of duodenum 06/02/2014   Epigastric pain 06/02/2014   Nausea & vomiting 06/02/2014   Insomnia 01/07/2014   Migraine without aura 01/07/2014   Post-operative state 10/28/2013   Adenomyosis 08/08/2013   Fibroids 08/08/2013   Anxiety disorder 03/22/2012   Panic disorder 03/22/2012   Hypokalemia 01/30/2012   Leukocytosis 01/30/2012   Dehydration 01/30/2012   Abdominal pain 01/06/2012   Tobacco abuse 01/06/2012   Hypersensitivity pneumonitis ([place]) 08/31/2011   Chronic diarrhea 08/28/2011   Collagenous colitis 08/28/2011   Chest pain 08/28/2011   Diarrhea 06/09/2011   Esophagitis 02/11/2011   Lymphocytic colitis 02/06/2011    Adrenal insufficiency ([place]) 02/06/2011   Hypothyroid 02/06/2011   DEPRESSION 06/20/2010   COSTOCHONDRITIS, ACUTE 03/14/2010   WOLFF (WOLFE)-PARKINSON-WHITE (WPW) SYNDROME 01/14/2009   ACUTE GASTRITIS WITHOUT MENTION OF HEMORRHAGE 01/14/2009   WEIGHT LOSS, RECENT 01/14/2009   ANEMIA, MACROCYTIC, HX OF 01/14/2009    Past Surgical History:  Procedure Laterality Date   ABDOMINAL HYSTERECTOMY  10/28/2013   CARDIAC ELECTROPHYSIOLOGY MAPPING AND ABLATION     for WPW   CHOLECYSTECTOMY     ESOPHAGOGASTRODUODENOSCOPY (EGD) WITH PROPOFOL N/A 06/04/2014   Procedure: ESOPHAGOGASTRODUODENOSCOPY (EGD) WITH PROPOFOL;  Surgeon: Jeryl Columbia, MD;  Location: [facility];  Service: Endoscopy;  Laterality: N/A;   LITHOTRIPSY     SALPINGOOPHORECTOMY Bilateral 10/28/2013   Procedure: SALPINGO OOPHORECTOMY;  Surgeon: Emily Filbert, MD;  Location: [facility];  Service: Gynecology;  Laterality: Bilateral;   SPHINCTEROTOMY     TUBAL LIGATION     VAGINAL HYSTERECTOMY  10/28/2013   Procedure: HYSTERECTOMY VAGINAL;  Surgeon: Emily Filbert, MD;  Location: [facility];  Service: Gynecology;;   VIDEO BRONCHOSCOPY  08/31/2011   Procedure: VIDEO BRONCHOSCOPY WITH FLUORO;  Surgeon: Elsie Stain, MD;  Location: [facility];  Service: Cardiopulmonary;  Laterality: N/A;    OB History     Gravida  3   Para  3   Term  3   Preterm      AB      Living  3  SAB      IAB      Ectopic      Multiple      Live Births               Home Medications    Prior to Admission medications   Medication Sig Start Date End Date Taking? Authorizing Provider  benzonatate (TESSALON) 200 MG capsule Take 1 capsule (200 mg total) by mouth 3 (three) times daily as needed for up to 7 days for cough. 10/07/20 10/14/20 Yes Eliezer Lofts, FNP  methylPREDNISolone (MEDROL DOSEPAK) 4 MG TBPK tablet Take as directed. 10/07/20  Yes Eliezer Lofts, FNP  cyanocobalamin (,VITAMIN B-12,) 1000 MCG/ML injection Inject 1 mL (1,000 mcg total)  into the muscle once. Patient not taking: Reported on 12/09/2017 08/18/14   Charlynne Cousins, MD  DULoxetine (CYMBALTA) 60 MG capsule Take 120 mg by mouth daily. Patient not taking: Reported on 10/07/2020 11/26/17   [provider]  escitalopram (LEXAPRO) 20 MG tablet Take 20 mg by mouth daily. 09/07/20   [provider]  loperamide (IMODIUM) 2 MG capsule Take 1 capsule (2 mg total) by mouth as needed for diarrhea or loose stools. Patient not taking: Reported on 12/09/2017 08/18/14   Charlynne Cousins, MD  metoCLOPramide (REGLAN) 10 MG tablet Take 1 tablet (10 mg total) by mouth 3 (three) times daily with meals. Patient not taking: Reported on 10/07/2020 12/22/15 12/21/16  Rudene Re, MD  oxyCODONE-acetaminophen (ROXICET) 5-325 MG tablet Take 1 tablet by mouth every 4 (four) hours as needed for severe pain. Patient not taking: Reported on 12/09/2017 12/22/15   Rudene Re, MD  pantoprazole (PROTONIX) 40 MG tablet Take 40 mg by mouth 2 (two) times daily. 10/24/17   [provider]  potassium chloride SA (K-DUR,KLOR-CON) 20 MEQ tablet Take 2 tablets (40 mEq total) by mouth 2 (two) times daily. Patient not taking: Reported on 12/09/2017 12/24/15   Davonna Belling, MD  potassium chloride SA (K-DUR,KLOR-CON) 20 MEQ tablet Take 40 mEq by mouth 3 (three) times daily. Patient not taking: Reported on 10/07/2020    [provider]  promethazine (PHENERGAN) 25 MG tablet Take 1 tablet (25 mg total) by mouth every 6 (six) hours as needed for nausea or vomiting. Patient not taking: Reported on 12/09/2017 06/08/15   Little, Wenda Overland, MD  promethazine (PHENERGAN) 25 MG tablet Take 25 mg by mouth every 8 (eight) hours as needed for nausea or vomiting.    [provider]    Family History Family History  Problem Relation Age of Onset   Coronary artery disease Maternal Grandfather    Prostate cancer Father     Social History Social History   Tobacco Use    Smoking status: Every Day    Packs/day: 0.50    Years: 30.00    Pack years: 15.00    Types: Cigarettes   Smokeless tobacco: Never  Substance Use Topics   Alcohol use: Yes    Alcohol/week: 1.0 standard drink    Types: 1 Glasses of wine per week   Drug use: No    Types: Barbituates     Allergies   Amoxicillin, Butrans [buprenorphine], and Nsaids   Review of Systems Review of Systems  Constitutional:  Positive for fever.  Respiratory:  Positive for cough.        Bilateral chest congestion for 4 days    Physical Exam Triage Vital Signs ED Triage Vitals  Enc Vitals Group     BP 10/07/20  1246 (!) 135/93     Pulse Rate 10/07/20 1246 99     Resp 10/07/20 1246 20     Temp 10/07/20 1246 99.3 F (37.4 C)     Temp Source 10/07/20 1246 Oral     SpO2 10/07/20 1246 98 %     Weight 10/07/20 1250 146 lb (66.2 kg)     Height 10/07/20 1250 5\' 4"  (1.626 m)     Head Circumference --      Peak Flow --      Pain Score 10/07/20 1250 4     Pain Loc --      Pain Edu? --      Excl. in [place]? --    No data found.  Updated Vital Signs BP (!) 135/93 (BP Location: Right Arm)   Pulse 99   Temp 99.3 F (37.4 C) (Oral)   Resp 20   Ht 5\' 4"  (1.626 m)   Wt 146 lb (66.2 kg)   LMP 08/06/2013   SpO2 98%   BMI 25.06 kg/m    Physical Exam Vitals and nursing note reviewed.  Constitutional:      General: She is not in acute distress.    Appearance: Normal appearance. She is ill-appearing.  HENT:     Head: Normocephalic and atraumatic.     Right Ear: Tympanic membrane and ear canal normal.     Left Ear: Tympanic membrane and ear canal normal.     Nose: Nose normal.     Mouth/Throat:     Mouth: Mucous membranes are moist.     Pharynx: Oropharynx is clear.  Eyes:     Extraocular Movements: Extraocular movements intact.     Conjunctiva/sclera: Conjunctivae normal.     Pupils: Pupils are equal, round, and reactive to light.  Cardiovascular:     Rate and Rhythm: Normal rate and regular  rhythm.     Pulses: Normal pulses.     Heart sounds: Normal heart sounds.  Pulmonary:     Effort: Pulmonary effort is normal. No respiratory distress.     Breath sounds: Normal breath sounds. No wheezing, rhonchi or rales.  Musculoskeletal:        General: Normal range of motion.     Cervical back: Normal range of motion and neck supple. No tenderness.  Lymphadenopathy:     Cervical: No cervical adenopathy.  Skin:    General: Skin is warm and dry.     Capillary Refill: Capillary refill takes 2 to 3 seconds.  Neurological:     General: No focal deficit present.     Mental Status: She is alert and oriented to person, place, and time. Mental status is at baseline.  Psychiatric:        Mood and Affect: Mood normal.        Behavior: Behavior normal.     UC Treatments / Results  Labs (all labs ordered are listed, but only abnormal results are displayed) Labs Reviewed - No data to display  EKG   Radiology DG Chest 2 View  Result Date: 10/07/2020 CLINICAL DATA:  Cough for 5 days. EXAM: CHEST - 2 VIEW COMPARISON:  03/15/2014 FINDINGS: The heart size and mediastinal contours are within normal limits. Azygos fissure incidentally noted. Both lungs are clear. The visualized skeletal structures are unremarkable. IMPRESSION: No active cardiopulmonary disease. Electronically Signed   By: Marlaine Hind M.D.   On: 10/07/2020 14:16    Procedures Procedures (including critical care time)  Medications Ordered in UC Medications -  No data to display  Initial Impression / Assessment and Plan / UC Course  I have reviewed the triage vital signs and the nursing notes.  Pertinent labs & imaging results that were available during my care of the patient were reviewed by me and considered in my medical decision making (see chart for details).    MDM: 1.  Cough-CRX negative for active cardiopulmonary disease.  Rx'd Medrol Dosepak and Tessalon Perles.  Final Clinical Impressions(s) / UC Diagnoses    Final diagnoses:  Cough     Discharge Instructions      Advised patient take medication as directed with food to completion.  Advised patient may use Tessalon Perles daily, as needed for cough encourage patient to smoke less and encourage increase daily water intake.     ED Prescriptions     Medication Sig Dispense Auth. Provider   methylPREDNISolone (MEDROL DOSEPAK) 4 MG TBPK tablet Take as directed. 1 each Eliezer Lofts, FNP   benzonatate (TESSALON) 200 MG capsule Take 1 capsule (200 mg total) by mouth 3 (three) times daily as needed for up to 7 days for cough. 30 capsule Eliezer Lofts, FNP      PDMP not reviewed this encounter.   Eliezer Lofts, [place] 10/07/20 1505

## 2020-10-07 NOTE — ED Triage Notes (Signed)
Visiting from [place] - arrived on 09/29/20 Cough started on Sat w/ sore throat  COVID Pneumonia 1/21 hospitalized  Hx of pneumonia Denies fever  No OTC meds

## 2023-09-23 ENCOUNTER — Ambulatory Visit
Admission: EM | Admit: 2023-09-23 | Discharge: 2023-09-23 | Disposition: A | Discharge status: Home / Self Care | Attending: Family Medicine | Admitting: Family Medicine

## 2023-09-23 ENCOUNTER — Ambulatory Visit (INDEPENDENT_AMBULATORY_CARE_PROVIDER_SITE_OTHER)

## 2023-09-23 ENCOUNTER — Encounter: Payer: Self-pay | Admitting: Emergency Medicine

## 2023-09-23 DIAGNOSIS — R0602 Shortness of breath: Secondary | ICD-10-CM | POA: Diagnosis not present

## 2023-09-23 DIAGNOSIS — J438 Other emphysema: Secondary | ICD-10-CM | POA: Diagnosis not present

## 2023-09-23 DIAGNOSIS — J189 Pneumonia, unspecified organism: Secondary | ICD-10-CM

## 2023-09-23 DIAGNOSIS — F17200 Nicotine dependence, unspecified, uncomplicated: Secondary | ICD-10-CM

## 2023-09-23 DIAGNOSIS — R059 Cough, unspecified: Secondary | ICD-10-CM

## 2023-09-23 MED ORDER — IPRATROPIUM-ALBUTEROL 0.5-2.5 (3) MG/3ML IN SOLN
3.0000 mL | Freq: Once | RESPIRATORY_TRACT | Status: AC
  Administered 2023-09-23: 3 mL via RESPIRATORY_TRACT

## 2023-09-23 MED ORDER — PREDNISONE 20 MG PO TABS: 40.0000 mg | ORAL_TABLET | Freq: Every day | ORAL | 0 refills | Status: DC

## 2023-09-23 MED ORDER — METHYLPREDNISOLONE SODIUM SUCC 125 MG IJ SOLR
80.0000 mg | Freq: Once | INTRAMUSCULAR | Status: AC
  Administered 2023-09-23: 80 mg via INTRAMUSCULAR

## 2023-09-23 MED ORDER — DOXYCYCLINE HYCLATE 100 MG PO CAPS: 100.0000 mg | ORAL_CAPSULE | Freq: Two times a day (BID) | ORAL | 0 refills | Status: DC

## 2023-09-23 MED ORDER — ALBUTEROL SULFATE HFA 108 (90 BASE) MCG/ACT IN AERS: 1.0000 | INHALATION_SPRAY | Freq: Four times a day (QID) | RESPIRATORY_TRACT | 0 refills | Status: AC | PRN

## 2023-09-23 NOTE — ED Provider Notes (Signed)
 [facility]    CSN: [id number] Arrival date & time: 09/23/23  1430      History   Chief Complaint Chief Complaint  Patient presents with   Cough    HPI Dawn Frost is a 58 y.o. female.   Patient is here for a cough.  She has an acute cough for 4 days.  She states she is coughing up some sputum.  She is a longtime smoker.  Has emphysema documented on old CT of chest many years ago.  Patient is unaware that she has emphysema.  She lives in [place]  but comes to this area often visiting family.  She is not on any inhalers or COPD medication.  No fever or chills.  She is feeling short of breath.  She is feeling very tired.    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[facility])    Anemia    Anxiety    Anxiety and depression    Chronic back pain    Chronic diarrhea    Clostridium difficile diarrhea    Collagenous colitis    Depression    Drug-seeking behavior    Hypokalemia    Irritable bowel syndrome (IBS)    Kidney stone    Kidney stone    Pancreatitis    Pneumonia due to COVID-19 virus 04/2020   hospitalized in [place]   Thyroid  disease    Vaginal delivery [id number], 1998   WPW (Wolff-Parkinson-White syndrome)     Patient Active Problem List   Diagnosis Date Noted   Intractable nausea and vomiting 08/16/2014   Macrocytic anemia 08/16/2014   Hypokalemia due to excessive gastrointestinal loss of potassium 07/29/2014   Peptic ulcer of duodenum 06/02/2014   Epigastric pain 06/02/2014   Nausea & vomiting 06/02/2014   Insomnia 01/07/2014   Migraine without aura 01/07/2014   Post-operative state 10/28/2013   Adenomyosis 08/08/2013   Fibroids 08/08/2013   Anxiety disorder 03/22/2012   Panic disorder 03/22/2012   Hypokalemia 01/30/2012   Leukocytosis 01/30/2012   Dehydration 01/30/2012   Abdominal pain 01/06/2012   Tobacco abuse 01/06/2012   Hypersensitivity pneumonitis ([facility]) 08/31/2011   Chronic diarrhea 08/28/2011   Collagenous colitis  08/28/2011   Chest pain 08/28/2011   Diarrhea 06/09/2011   Esophagitis 02/11/2011   Lymphocytic colitis 02/06/2011   Adrenal insufficiency ([facility]) 02/06/2011   Hypothyroid 02/06/2011   DEPRESSION 06/20/2010   COSTOCHONDRITIS, ACUTE 03/14/2010   WOLFF (WOLFE)-PARKINSON-WHITE (WPW) SYNDROME 01/14/2009   Acute gastritis 01/14/2009   WEIGHT LOSS, RECENT 01/14/2009   ANEMIA, MACROCYTIC, HX OF 01/14/2009    Past Surgical History:  Procedure Laterality Date   ABDOMINAL HYSTERECTOMY  10/28/2013   CARDIAC ELECTROPHYSIOLOGY MAPPING AND ABLATION     for WPW   CHOLECYSTECTOMY     ESOPHAGOGASTRODUODENOSCOPY (EGD) WITH PROPOFOL  N/A 06/04/2014   Procedure: ESOPHAGOGASTRODUODENOSCOPY (EGD) WITH PROPOFOL ;  Surgeon: Oliva FORBES Boots, MD;  Location: [facility];  Service: Endoscopy;  Laterality: N/A;   LITHOTRIPSY     SALPINGOOPHORECTOMY Bilateral 10/28/2013   Procedure: SALPINGO OOPHORECTOMY;  Surgeon: Harland JAYSON Birkenhead, MD;  Location: [facility];  Service: Gynecology;  Laterality: Bilateral;   SPHINCTEROTOMY     TUBAL LIGATION     VAGINAL HYSTERECTOMY  10/28/2013   Procedure: HYSTERECTOMY VAGINAL;  Surgeon: Harland JAYSON Birkenhead, MD;  Location: [facility];  Service: Gynecology;;   VIDEO BRONCHOSCOPY  08/31/2011   Procedure: VIDEO BRONCHOSCOPY WITH FLUORO;  Surgeon: Belvie FORBES Silvan, MD;  Location: [facility];  Service: Cardiopulmonary;  Laterality: N/A;  OB History     Gravida  3   Para  3   Term  3   Preterm      AB      Living  3      SAB      IAB      Ectopic      Multiple      Live Births               Home Medications    Prior to Admission medications   Medication Sig Start Date End Date Taking? Authorizing Provider  albuterol  (VENTOLIN  HFA) 108 (90 Base) MCG/ACT inhaler Inhale 1-2 puffs into the lungs every 6 (six) hours as needed for wheezing or shortness of breath. 09/23/23  Yes Maranda Jamee Jacob, MD  doxycycline (VIBRAMYCIN) 100 MG capsule Take 1 capsule (100 mg total) by mouth 2  (two) times daily. 09/23/23  Yes Maranda Jamee Jacob, MD  escitalopram  (LEXAPRO ) 20 MG tablet Take 20 mg by mouth daily. 09/07/20  Yes [provider]  pantoprazole  (PROTONIX ) 40 MG tablet Take 40 mg by mouth 2 (two) times daily. 10/24/17  Yes [provider]  predniSONE  (DELTASONE ) 20 MG tablet Take 2 tablets (40 mg total) by mouth daily with breakfast. 09/23/23  Yes Maranda Jamee Jacob, MD  promethazine  (PHENERGAN ) 25 MG tablet Take 25 mg by mouth every 8 (eight) hours as needed for nausea or vomiting.    [provider]    Family History Family History  Problem Relation Age of Onset   Coronary artery disease Maternal Grandfather    Prostate cancer Father     Social History Social History   Tobacco Use   Smoking status: Every Day    Current packs/day: 0.50    Average packs/day: 0.5 packs/day for 30.0 years (15.0 ttl pk-yrs)    Types: Cigarettes   Smokeless tobacco: Never  Vaping Use   Vaping status: Never Used  Substance Use Topics   Alcohol use: Yes    Alcohol/week: 1.0 standard drink of alcohol    Types: 1 Glasses of wine per week   Drug use: No    Types: Barbituates     Allergies   Amoxicillin, Butrans [buprenorphine], and Nsaids   Review of Systems Review of Systems   Physical Exam Triage Vital Signs ED Triage Vitals  Encounter Vitals Group     BP 09/23/23 1443 (!) 131/96     Girls Systolic BP Percentile --      Girls Diastolic BP Percentile --      Boys Systolic BP Percentile --      Boys Diastolic BP Percentile --      Pulse Rate 09/23/23 1443 (!) 106     Resp 09/23/23 1443 (!) 28     Temp 09/23/23 1443 98 F (36.7 C)     Temp Source 09/23/23 1443 Oral     SpO2 09/23/23 1443 92 %     Weight --      Height --      Head Circumference --      Peak Flow --      Pain Score 09/23/23 1446 0     Pain Loc --      Pain Education --      Exclude from Growth Chart --    No data found.  Updated Vital Signs BP (!) 131/96 (BP Location:  Right Arm)   Pulse (!) 106   Temp 98 F (36.7 C) (Oral)   Resp ROLLEN)  28   LMP 08/06/2013   SpO2 92%   Physical Exam Constitutional:      General: She is not in acute distress.    Appearance: She is well-developed. She is ill-appearing.  HENT:     Head: Normocephalic and atraumatic.   Eyes:     Conjunctiva/sclera: Conjunctivae normal.     Pupils: Pupils are equal, round, and reactive to light.    Cardiovascular:     Rate and Rhythm: Regular rhythm. Tachycardia present.     Heart sounds: Normal heart sounds.  Pulmonary:     Effort: Pulmonary effort is normal. No respiratory distress.     Breath sounds: Wheezing and rhonchi present.     Comments: Few anterior wheezes and rhonchi.  No rales identified Abdominal:     General: There is no distension.     Palpations: Abdomen is soft.   Musculoskeletal:        General: Normal range of motion.     Cervical back: Normal range of motion.   Skin:    General: Skin is warm and dry.   Neurological:     Mental Status: She is alert.      UC Treatments / Results  Labs (all labs ordered are listed, but only abnormal results are displayed) Labs Reviewed - No data to display  EKG   Radiology DG Chest 2 View Result Date: 09/23/2023 CLINICAL DATA:  Four day history of cough and shortness of breath EXAM: CHEST - 2 VIEW COMPARISON:  Chest radiograph dated 10/07/2020 FINDINGS: Incidentally noted azygous fissure. Normal lung volumes. Patchy right middle lobe/lingular opacity. No pleural effusion or pneumothorax. The heart size and mediastinal contours are within normal limits. No acute osseous abnormality. IMPRESSION: Patchy right middle lobe/lingular opacity, which may represent atelectasis or pneumonia. Electronically Signed   By: Limin  Xu M.D.   On: 09/23/2023 15:24    Procedures Procedures (including critical care time)  Medications Ordered in UC Medications  methylPREDNISolone  sodium succinate  (SOLU-MEDROL ) 125 mg/2 mL  injection 80 mg (80 mg Intramuscular Given 09/23/23 1456)  ipratropium-albuterol  (DUONEB) 0.5-2.5 (3) MG/3ML nebulizer solution 3 mL (3 mLs Nebulization Given 09/23/23 1456)    Initial Impression / Assessment and Plan / UC Course  I have reviewed the triage vital signs and the nursing notes.  Pertinent labs & imaging results that were available during my care of the patient were reviewed by me and considered in my medical decision making (see chart for details).     Patient is allergic to penicillins Will treat with doxycycline Return if not better in a few days Final Clinical Impressions(s) / UC Diagnoses   Final diagnoses:  SOB (shortness of breath)  Other emphysema (HCC)  Tobacco dependence  Community acquired pneumonia of right middle lobe of lung     Discharge Instructions      Make sure you are drinking lots of water Take the antibiotic 2 times a day.  Take this antibiotic with food Take the prednisone  once a day for 5 days I have prescribed an albuterol  inhaler for you to use when you feel short of breath Try to quit, or at least reduce your smoking See your doctor if not improving by next week     ED Prescriptions     Medication Sig Dispense Auth. Provider   predniSONE  (DELTASONE ) 20 MG tablet Take 2 tablets (40 mg total) by mouth daily with breakfast. 10 tablet Maranda Jamee Jacob, MD   doxycycline (VIBRAMYCIN) 100 MG capsule Take 1 capsule (  100 mg total) by mouth 2 (two) times daily. 14 capsule Maranda Jamee Jacob, MD   albuterol  (VENTOLIN  HFA) 108 (90 Base) MCG/ACT inhaler Inhale 1-2 puffs into the lungs every 6 (six) hours as needed for wheezing or shortness of breath. 18 g Maranda Jamee Jacob, MD      PDMP not reviewed this encounter.   Maranda Jamee Jacob, MD 09/23/23 1536

## 2023-09-23 NOTE — ED Triage Notes (Signed)
 Patient c/o cough, congestion and SOB x 3 days.  Patient does get winded with walking.  Patient has taken Tylenol  Cold & Flu, using her Albuterol  Inhaler w/o relief.

## 2023-09-23 NOTE — Discharge Instructions (Signed)
 Make sure you are drinking lots of water Take the antibiotic 2 times a day.  Take this antibiotic with food Take the prednisone  once a day for 5 days I have prescribed an albuterol  inhaler for you to use when you feel short of breath Try to quit, or at least reduce your smoking See your doctor if not improving by next week

## 2023-09-23 NOTE — ED Notes (Signed)
 Resp e/u, tachypneic. MD already aware.

## 2024-02-21 ENCOUNTER — Other Ambulatory Visit: Payer: Self-pay

## 2024-02-21 ENCOUNTER — Ambulatory Visit
Admission: RE | Admit: 2024-02-21 | Discharge: 2024-02-21 | Disposition: A | Discharge status: Home / Self Care | Source: Ambulatory Visit | Attending: Family Medicine | Admitting: Family Medicine

## 2024-02-21 VITALS — BP 122/76 | HR 104 | Temp 98.7°F | Resp 16

## 2024-02-21 DIAGNOSIS — R1030 Lower abdominal pain, unspecified: Secondary | ICD-10-CM

## 2024-02-21 DIAGNOSIS — N3001 Acute cystitis with hematuria: Secondary | ICD-10-CM | POA: Diagnosis not present

## 2024-02-21 LAB — POCT URINALYSIS DIP (MANUAL ENTRY)
Bilirubin, UA: NEGATIVE
Glucose, UA: NEGATIVE mg/dL
Ketones, POC UA: NEGATIVE mg/dL
Nitrite, UA: POSITIVE — AB
Protein Ur, POC: >=300 mg/dL — AB
Spec Grav, UA: >=1.03 — AB (ref 1.010–1.025)
Urobilinogen, UA: 0.2 U/dL
pH, UA: 6 (ref 5.0–8.0)

## 2024-02-21 MED ORDER — SULFAMETHOXAZOLE-TRIMETHOPRIM 800-160 MG PO TABS: 1.0000 | ORAL_TABLET | Freq: Two times a day (BID) | ORAL | 0 refills | Status: AC

## 2024-02-21 NOTE — Discharge Instructions (Addendum)
 Advised patient take medication as directed with food to completion.  Encouraged increase daily water intake to 64 ounces per day while taking this medication.  Advised we will follow-up with urine culture results once received.  Advised if symptoms worsen and/or unresolved please follow-up with your PCP, [facility] urology, or here for further evaluation.  Contact information provided with this AVS today.

## 2024-02-21 NOTE — ED Provider Notes (Signed)
 Dawn Frost CARE    CSN: [id number] Arrival date & time: 02/21/24  1337      History   Chief Complaint Chief Complaint  Patient presents with   Abdominal Pain    lower abdominal pain - Entered by patient    HPI Dawn Frost is a 58 y.o. female.   HPI Pleasant 58 year old female presents with lower abdominal pain for 2 days.  PMH significant for Addison's disease, chronic back pain and chronic diarrhea.  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HCC)    Anemia    Anxiety    Anxiety and depression    Chronic back pain    Chronic diarrhea    Clostridium difficile diarrhea    Collagenous colitis    Depression    Drug-seeking behavior    Hypokalemia    Irritable bowel syndrome (IBS)    Kidney stone    Kidney stone    Pancreatitis    Pneumonia due to COVID-19 virus 04/2020   hospitalized in [place]   Thyroid  disease    Vaginal delivery 8011,8004, 1998   WPW (Wolff-Parkinson-White syndrome)     Patient Active Problem List   Diagnosis Date Noted   Intractable nausea and vomiting 08/16/2014   Macrocytic anemia 08/16/2014   Hypokalemia due to excessive gastrointestinal loss of potassium 07/29/2014   Peptic ulcer of duodenum 06/02/2014   Epigastric pain 06/02/2014   Nausea & vomiting 06/02/2014   Insomnia 01/07/2014   Migraine without aura 01/07/2014   Post-operative state 10/28/2013   Adenomyosis 08/08/2013   Fibroids 08/08/2013   Anxiety disorder 03/22/2012   Panic disorder 03/22/2012   Hypokalemia 01/30/2012   Leukocytosis 01/30/2012   Dehydration 01/30/2012   Abdominal pain 01/06/2012   Tobacco abuse 01/06/2012   Hypersensitivity pneumonitis (HCC) 08/31/2011   Chronic diarrhea 08/28/2011   Collagenous colitis 08/28/2011   Chest pain 08/28/2011   Diarrhea 06/09/2011   Esophagitis 02/11/2011   Lymphocytic colitis 02/06/2011   Adrenal insufficiency 02/06/2011   Hypothyroid 02/06/2011   DEPRESSION 06/20/2010   COSTOCHONDRITIS,  ACUTE 03/14/2010   WOLFF (WOLFE)-PARKINSON-WHITE (WPW) SYNDROME 01/14/2009   Acute gastritis 01/14/2009   WEIGHT LOSS, RECENT 01/14/2009   ANEMIA, MACROCYTIC, HX OF 01/14/2009    Past Surgical History:  Procedure Laterality Date   ABDOMINAL HYSTERECTOMY  10/28/2013   CARDIAC ELECTROPHYSIOLOGY MAPPING AND ABLATION     for WPW   CHOLECYSTECTOMY     ESOPHAGOGASTRODUODENOSCOPY (EGD) WITH PROPOFOL  N/A 06/04/2014   Procedure: ESOPHAGOGASTRODUODENOSCOPY (EGD) WITH PROPOFOL ;  Surgeon: Oliva FORBES Boots, MD;  Location: [facility];  Service: Endoscopy;  Laterality: N/A;   LITHOTRIPSY     SALPINGOOPHORECTOMY Bilateral 10/28/2013   Procedure: SALPINGO OOPHORECTOMY;  Surgeon: Harland JAYSON Birkenhead, MD;  Location: [facility];  Service: Gynecology;  Laterality: Bilateral;   SPHINCTEROTOMY     TUBAL LIGATION     VAGINAL HYSTERECTOMY  10/28/2013   Procedure: HYSTERECTOMY VAGINAL;  Surgeon: Harland JAYSON Birkenhead, MD;  Location: [facility];  Service: Gynecology;;   VIDEO BRONCHOSCOPY  08/31/2011   Procedure: VIDEO BRONCHOSCOPY WITH FLUORO;  Surgeon: Belvie FORBES Silvan, MD;  Location: [facility];  Service: Cardiopulmonary;  Laterality: N/A;    OB History     Gravida  3   Para  3   Term  3   Preterm      AB      Living  3      SAB      IAB      Ectopic  Multiple      Live Births               Home Medications    Prior to Admission medications   Medication Sig Start Date End Date Taking? Authorizing Provider  lisdexamfetamine (VYVANSE ) 50 MG capsule Take 50 mg by mouth daily.   Yes [provider]  sucralfate  (CARAFATE ) 1 g tablet Take 1 g by mouth 4 (four) times daily -  with meals and at bedtime.   Yes [provider]  sulfamethoxazole-trimethoprim (BACTRIM DS) 800-160 MG tablet Take 1 tablet by mouth 2 (two) times daily for 7 days. 02/21/24 02/28/24 Yes Teddy Sharper, FNP  albuterol  (VENTOLIN  HFA) 108 (90 Base) MCG/ACT inhaler Inhale 1-2 puffs into the lungs every 6 (six) hours as  needed for wheezing or shortness of breath. 09/23/23   Maranda Jamee Jacob, MD  escitalopram  (LEXAPRO ) 20 MG tablet Take 20 mg by mouth daily. 09/07/20   [provider]  pantoprazole  (PROTONIX ) 40 MG tablet Take 40 mg by mouth 2 (two) times daily. 10/24/17   [provider]  promethazine  (PHENERGAN ) 25 MG tablet Take 25 mg by mouth every 8 (eight) hours as needed for nausea or vomiting.    [provider]    Family History Family History  Problem Relation Age of Onset   Coronary artery disease Maternal Grandfather    Prostate cancer Father     Social History Social History   Tobacco Use   Smoking status: Every Day    Current packs/day: 0.50    Average packs/day: 0.5 packs/day for 30.0 years (15.0 ttl pk-yrs)    Types: Cigarettes   Smokeless tobacco: Never  Vaping Use   Vaping status: Never Used  Substance Use Topics   Alcohol use: Not Currently    Alcohol/week: 1.0 standard drink of alcohol    Types: 1 Glasses of wine per week   Drug use: No    Types: Barbituates     Allergies   Amoxicillin, Butrans [buprenorphine], and Nsaids   Review of Systems Review of Systems  Gastrointestinal:  Positive for abdominal pain.  Genitourinary:  Positive for frequency.     Physical Exam Triage Vital Signs ED Triage Vitals  Encounter Vitals Group     BP 02/21/24 1345 122/76     Girls Systolic BP Percentile --      Girls Diastolic BP Percentile --      Boys Systolic BP Percentile --      Boys Diastolic BP Percentile --      Pulse Rate 02/21/24 1345 (!) 104     Resp 02/21/24 1345 16     Temp 02/21/24 1345 98.7 F (37.1 C)     Temp src --      SpO2 02/21/24 1345 94 %     Weight --      Height --      Head Circumference --      Peak Flow --      Pain Score 02/21/24 1350 7     Pain Loc --      Pain Education --      Exclude from Growth Chart --    No data found.  Updated Vital Signs BP 122/76   Pulse (!) 104   Temp 98.7 F (37.1 C)   Resp 16    LMP 08/06/2013   SpO2 94%   Visual Acuity Right Eye Distance:   Left Eye Distance:   Bilateral Distance:    Right Eye  Near:   Left Eye Near:    Bilateral Near:     Physical Exam Vitals and nursing note reviewed.  Constitutional:      Appearance: Normal appearance. She is normal weight. She is ill-appearing.  HENT:     Head: Normocephalic and atraumatic.     Mouth/Throat:     Mouth: Mucous membranes are moist.     Pharynx: Oropharynx is clear.  Eyes:     Extraocular Movements: Extraocular movements intact.     Conjunctiva/sclera: Conjunctivae normal.     Pupils: Pupils are equal, round, and reactive to light.  Cardiovascular:     Rate and Rhythm: Normal rate and regular rhythm.     Heart sounds: Normal heart sounds.  Pulmonary:     Effort: Pulmonary effort is normal.     Breath sounds: Normal breath sounds. No wheezing, rhonchi or rales.  Abdominal:     Tenderness: There is no right CVA tenderness or left CVA tenderness.  Skin:    General: Skin is warm and dry.  Neurological:     General: No focal deficit present.     Mental Status: She is alert and oriented to person, place, and time. Mental status is at baseline.  Psychiatric:        Mood and Affect: Mood normal.        Behavior: Behavior normal.      UC Treatments / Results  Labs (all labs ordered are listed, but only abnormal results are displayed) Labs Reviewed  POCT URINALYSIS DIP (MANUAL ENTRY) - Abnormal; Notable for the following components:      Result Value   Clarity, UA cloudy (*)    Spec Grav, UA >=1.030 (*)    Blood, UA large (*)    Protein Ur, POC >=300 (*)    Nitrite, UA Positive (*)    Leukocytes, UA Small (1+) (*)    All other components within normal limits  URINE CULTURE    EKG   Radiology No results found.  Procedures Procedures (including critical care time)  Medications Ordered in UC Medications - No data to display  Initial Impression / Assessment and Plan / UC Course   I have reviewed the triage vital signs and the nursing notes.  Pertinent labs & imaging results that were available during my care of the patient were reviewed by me and considered in my medical decision making (see chart for details).     MDM: 1.  Acute cystitis with hematuria-UA revealed above, urine culture ordered, Rx'd Bactrim DS 800-160 mg tablet: Take 1 tablet twice daily x 7 days; 2.  Lower abdominal pain-secondary to above. Advised patient take medication as directed with food to completion.  Encouraged increase daily water intake to 64 ounces per day while taking this medication.  Advised we will follow-up with urine culture results once received.  Advised if symptoms worsen and/or unresolved please follow-up with your PCP, [facility] urology, or here for further evaluation.  Contact information provided with this AVS today.  Patient discharged home, hemodynamically stable. Final Clinical Impressions(s) / UC Diagnoses   Final diagnoses:  Lower abdominal pain  Acute cystitis with hematuria     Discharge Instructions      Advised patient take medication as directed with food to completion.  Encouraged increase daily water intake to 64 ounces per day while taking this medication.  Advised we will follow-up with urine culture results once received.  Advised if symptoms worsen and/or unresolved please follow-up with your PCP,  [facility] urology, or here for further evaluation.  Contact information provided with this AVS today.     ED Prescriptions     Medication Sig Dispense Auth. Provider   sulfamethoxazole-trimethoprim (BACTRIM DS) 800-160 MG tablet Take 1 tablet by mouth 2 (two) times daily for 7 days. 14 tablet Burgundy Matuszak, FNP      PDMP not reviewed this encounter.   Teddy Sharper, FNP 02/21/24 1429

## 2024-02-21 NOTE — ED Triage Notes (Signed)
 X couple of days has had low abd pain. Reports radiates around left side and hurts in left flank. Reports hurts everywhere. Reports she feels like she has to urinate frequently. Does not burn or hurt while urinating, but doesn't feel normal. Had temp of 100 the other day, per pt. Had motrin and did not help much.

## 2024-02-22 ENCOUNTER — Telehealth: Payer: Self-pay

## 2024-02-22 NOTE — Telephone Encounter (Signed)
 Call made to pt to check on status since UC visit. No needs at this time. Call back if any questions or concerns.

## 2024-02-24 LAB — URINE CULTURE: Culture: >=100000 — AB

## 2024-02-26 ENCOUNTER — Ambulatory Visit (HOSPITAL_COMMUNITY): Payer: Self-pay

## 2024-03-09 ENCOUNTER — Observation Stay (HOSPITAL_COMMUNITY): Admitting: Certified Registered Nurse Anesthetist

## 2024-03-09 ENCOUNTER — Emergency Department (HOSPITAL_COMMUNITY)

## 2024-03-09 ENCOUNTER — Encounter (HOSPITAL_COMMUNITY): Payer: Self-pay | Admitting: Family Medicine

## 2024-03-09 ENCOUNTER — Other Ambulatory Visit: Payer: Self-pay

## 2024-03-09 ENCOUNTER — Observation Stay (HOSPITAL_COMMUNITY)

## 2024-03-09 ENCOUNTER — Observation Stay (HOSPITAL_COMMUNITY)
Admission: EM | Admit: 2024-03-09 | Discharge: 2024-03-11 | DRG: 854 | Disposition: A | Attending: Emergency Medicine | Admitting: Emergency Medicine

## 2024-03-09 ENCOUNTER — Encounter (HOSPITAL_COMMUNITY): Admission: EM | Disposition: A | Payer: Self-pay | Source: Home / Self Care | Attending: Emergency Medicine

## 2024-03-09 DIAGNOSIS — D72829 Elevated white blood cell count, unspecified: Secondary | ICD-10-CM | POA: Diagnosis present

## 2024-03-09 DIAGNOSIS — A419 Sepsis, unspecified organism: Secondary | ICD-10-CM | POA: Insufficient documentation

## 2024-03-09 DIAGNOSIS — Z720 Tobacco use: Secondary | ICD-10-CM | POA: Diagnosis present

## 2024-03-09 DIAGNOSIS — N201 Calculus of ureter: Secondary | ICD-10-CM | POA: Diagnosis present

## 2024-03-09 DIAGNOSIS — F419 Anxiety disorder, unspecified: Secondary | ICD-10-CM

## 2024-03-09 DIAGNOSIS — K529 Noninfective gastroenteritis and colitis, unspecified: Secondary | ICD-10-CM | POA: Diagnosis present

## 2024-03-09 DIAGNOSIS — K209 Esophagitis, unspecified without bleeding: Principal | ICD-10-CM | POA: Diagnosis present

## 2024-03-09 DIAGNOSIS — I456 Pre-excitation syndrome: Secondary | ICD-10-CM | POA: Diagnosis present

## 2024-03-09 DIAGNOSIS — F32A Depression, unspecified: Secondary | ICD-10-CM

## 2024-03-09 DIAGNOSIS — N3001 Acute cystitis with hematuria: Secondary | ICD-10-CM

## 2024-03-09 HISTORY — PX: CYSTOSCOPY WITH RETROGRADE PYELOGRAM, URETEROSCOPY AND STENT PLACEMENT: SHX5789

## 2024-03-09 HISTORY — PX: CYSTOSCOPY/RETROGRADE/URETEROSCOPY/STONE EXTRACTION WITH BASKET: SHX5317

## 2024-03-09 LAB — CBC WITH DIFFERENTIAL/PLATELET
Abs Immature Granulocytes: 0.09 K/uL — ABNORMAL HIGH (ref 0.00–0.07)
Basophils Absolute: 0.1 K/uL (ref 0.0–0.1)
Basophils Relative: 0 %
Eosinophils Absolute: 0.1 K/uL (ref 0.0–0.5)
Eosinophils Relative: 0 %
HCT: 39.6 % (ref 36.0–46.0)
Hemoglobin: 12.9 g/dL (ref 12.0–15.0)
Immature Granulocytes: 1 %
Lymphocytes Relative: 7 %
Lymphs Abs: 1.4 K/uL (ref 0.7–4.0)
MCH: 32.1 pg (ref 26.0–34.0)
MCHC: 32.6 g/dL (ref 30.0–36.0)
MCV: 98.5 fL (ref 80.0–100.0)
Monocytes Absolute: 0.7 K/uL (ref 0.1–1.0)
Monocytes Relative: 4 %
Neutro Abs: 17.1 K/uL — ABNORMAL HIGH (ref 1.7–7.7)
Neutrophils Relative %: 88 %
Platelets: 319 K/uL (ref 150–400)
RBC: 4.02 MIL/uL (ref 3.87–5.11)
RDW: 14.6 % (ref 11.5–15.5)
WBC: 19.3 K/uL — ABNORMAL HIGH (ref 4.0–10.5)
nRBC: 0 % (ref 0.0–0.2)

## 2024-03-09 LAB — I-STAT CHEM 8, ED
BUN: 9 mg/dL (ref 6–20)
Calcium, Ion: 1.18 mmol/L (ref 1.15–1.40)
Chloride: 102 mmol/L (ref 98–111)
Creatinine, Ser: 0.7 mg/dL (ref 0.44–1.00)
Glucose, Bld: 113 mg/dL — ABNORMAL HIGH (ref 70–99)
HCT: 41 % (ref 36.0–46.0)
Hemoglobin: 13.9 g/dL (ref 12.0–15.0)
Potassium: 4.4 mmol/L (ref 3.5–5.1)
Sodium: 136 mmol/L (ref 135–145)
TCO2: 26 mmol/L (ref 22–32)

## 2024-03-09 LAB — URINALYSIS, W/ REFLEX TO CULTURE (INFECTION SUSPECTED)
Bilirubin Urine: NEGATIVE
Glucose, UA: NEGATIVE mg/dL
Hgb urine dipstick: NEGATIVE
Ketones, ur: NEGATIVE mg/dL
Nitrite: NEGATIVE
Protein, ur: NEGATIVE mg/dL
Specific Gravity, Urine: 1.045 — ABNORMAL HIGH (ref 1.005–1.030)
pH: 8 (ref 5.0–8.0)

## 2024-03-09 LAB — COMPREHENSIVE METABOLIC PANEL WITH GFR
ALT: 12 U/L (ref 0–44)
AST: 20 U/L (ref 15–41)
Albumin: 4.4 g/dL (ref 3.5–5.0)
Alkaline Phosphatase: 144 U/L — ABNORMAL HIGH (ref 38–126)
Anion gap: 13 (ref 5–15)
BUN: 9 mg/dL (ref 6–20)
CO2: 24 mmol/L (ref 22–32)
Calcium: 10.3 mg/dL (ref 8.9–10.3)
Chloride: 101 mmol/L (ref 98–111)
Creatinine, Ser: 0.59 mg/dL (ref 0.44–1.00)
GFR, Estimated: >60 mL/min (ref >60)
Glucose, Bld: 110 mg/dL — ABNORMAL HIGH (ref 70–99)
Potassium: 4.2 mmol/L (ref 3.5–5.1)
Sodium: 138 mmol/L (ref 135–145)
Total Bilirubin: 0.4 mg/dL (ref 0.0–1.2)
Total Protein: 7.2 g/dL (ref 6.5–8.1)

## 2024-03-09 LAB — PROCALCITONIN: Procalcitonin: <0.1 ng/mL

## 2024-03-09 LAB — LIPASE, BLOOD: Lipase: 14 U/L (ref 11–51)

## 2024-03-09 LAB — PROTIME-INR
INR: 1 (ref 0.8–1.2)
Prothrombin Time: 13.8 s (ref 11.4–15.2)

## 2024-03-09 LAB — HIV ANTIBODY (ROUTINE TESTING W REFLEX): HIV Screen 4th Generation wRfx: NONREACTIVE

## 2024-03-09 SURGERY — CYSTOURETEROSCOPY, WITH RETROGRADE PYELOGRAM AND STENT INSERTION
Anesthesia: General | Site: Ureter | Laterality: Left

## 2024-03-09 MED ORDER — LACTATED RINGERS IV SOLN: INTRAVENOUS | Status: DC | PRN

## 2024-03-09 MED ORDER — SODIUM CHLORIDE 0.9 % IV SOLN
1.0000 g | Freq: Once | INTRAVENOUS | Status: DC
  Filled 2024-03-09: qty 10

## 2024-03-09 MED ORDER — FENTANYL CITRATE (PF) 100 MCG/2ML IJ SOLN
INTRAMUSCULAR | Status: AC
  Filled 2024-03-09: qty 2

## 2024-03-09 MED ORDER — ACETAMINOPHEN 650 MG RE SUPP: 650.0000 mg | Freq: Four times a day (QID) | RECTAL | Status: DC | PRN

## 2024-03-09 MED ORDER — SUCRALFATE 1 G PO TABS
1.0000 g | ORAL_TABLET | Freq: Three times a day (TID) | ORAL | Status: DC
  Administered 2024-03-09 – 2024-03-11 (×6): 1 g via ORAL
  Filled 2024-03-09 (×6): qty 1

## 2024-03-09 MED ORDER — ACETAMINOPHEN 10 MG/ML IV SOLN
INTRAVENOUS | Status: AC
  Filled 2024-03-09: qty 100

## 2024-03-09 MED ORDER — FENTANYL CITRATE (PF) 250 MCG/5ML IJ SOLN
INTRAMUSCULAR | Status: DC | PRN
  Administered 2024-03-09 (×2): 50 ug via INTRAVENOUS

## 2024-03-09 MED ORDER — PANTOPRAZOLE SODIUM 40 MG IV SOLR
40.0000 mg | Freq: Once | INTRAVENOUS | Status: AC
  Administered 2024-03-09: 40 mg via INTRAVENOUS
  Filled 2024-03-09: qty 10

## 2024-03-09 MED ORDER — ONDANSETRON HCL 4 MG PO TABS: 4.0000 mg | ORAL_TABLET | Freq: Four times a day (QID) | ORAL | Status: DC | PRN

## 2024-03-09 MED ORDER — PROPOFOL 10 MG/ML IV BOLUS
INTRAVENOUS | Status: DC | PRN
  Administered 2024-03-09: 30 mg via INTRAVENOUS
  Administered 2024-03-09: 140 mg via INTRAVENOUS
  Administered 2024-03-09: 30 mg via INTRAVENOUS

## 2024-03-09 MED ORDER — MORPHINE SULFATE (PF) 2 MG/ML IV SOLN
2.0000 mg | INTRAVENOUS | Status: DC | PRN
  Administered 2024-03-09 – 2024-03-11 (×16): 2 mg via INTRAVENOUS
  Filled 2024-03-09 (×16): qty 1

## 2024-03-09 MED ORDER — IOHEXOL 300 MG/ML  SOLN
INTRAMUSCULAR | Status: DC | PRN
  Administered 2024-03-09: 4 mL

## 2024-03-09 MED ORDER — SODIUM CHLORIDE 0.9 % IV SOLN: INTRAVENOUS | Status: DC

## 2024-03-09 MED ORDER — SODIUM CHLORIDE 0.9 % IR SOLN
Status: DC | PRN
  Administered 2024-03-09: 3000 mL via INTRAVESICAL

## 2024-03-09 MED ORDER — IOHEXOL 300 MG/ML  SOLN
100.0000 mL | Freq: Once | INTRAMUSCULAR | Status: AC | PRN
  Administered 2024-03-09: 90 mL via INTRAVENOUS

## 2024-03-09 MED ORDER — FOLIC ACID 1 MG PO TABS
1.0000 mg | ORAL_TABLET | Freq: Every day | ORAL | Status: DC
  Administered 2024-03-09 – 2024-03-11 (×3): 1 mg via ORAL
  Filled 2024-03-09 (×3): qty 1

## 2024-03-09 MED ORDER — SUCCINYLCHOLINE CHLORIDE 200 MG/10ML IV SOSY
PREFILLED_SYRINGE | INTRAVENOUS | Status: DC | PRN
  Administered 2024-03-09: 100 mg via INTRAVENOUS

## 2024-03-09 MED ORDER — MIDAZOLAM HCL 2 MG/2ML IJ SOLN
INTRAMUSCULAR | Status: AC
  Filled 2024-03-09: qty 2

## 2024-03-09 MED ORDER — ESCITALOPRAM OXALATE 20 MG PO TABS
20.0000 mg | ORAL_TABLET | Freq: Every day | ORAL | Status: DC
  Administered 2024-03-09 – 2024-03-11 (×3): 20 mg via ORAL
  Filled 2024-03-09: qty 1
  Filled 2024-03-09 (×2): qty 2

## 2024-03-09 MED ORDER — ADULT MULTIVITAMIN W/MINERALS CH
1.0000 | ORAL_TABLET | Freq: Every day | ORAL | Status: DC
  Administered 2024-03-09 – 2024-03-11 (×3): 1 via ORAL
  Filled 2024-03-09 (×3): qty 1

## 2024-03-09 MED ORDER — ONDANSETRON HCL 4 MG/2ML IJ SOLN
4.0000 mg | Freq: Once | INTRAMUSCULAR | Status: AC
  Administered 2024-03-09: 4 mg via INTRAVENOUS
  Filled 2024-03-09: qty 2

## 2024-03-09 MED ORDER — FENTANYL CITRATE (PF) 50 MCG/ML IJ SOSY: 25.0000 ug | PREFILLED_SYRINGE | INTRAMUSCULAR | Status: DC | PRN

## 2024-03-09 MED ORDER — DEXAMETHASONE SOD PHOSPHATE PF 10 MG/ML IJ SOLN
INTRAMUSCULAR | Status: DC | PRN
  Administered 2024-03-09: 5 mg via INTRAVENOUS

## 2024-03-09 MED ORDER — ONDANSETRON HCL 4 MG/2ML IJ SOLN: 4.0000 mg | Freq: Once | INTRAMUSCULAR | Status: DC | PRN

## 2024-03-09 MED ORDER — HYDROMORPHONE HCL 1 MG/ML IJ SOLN
0.5000 mg | Freq: Once | INTRAMUSCULAR | Status: AC
  Administered 2024-03-09: 0.5 mg via INTRAVENOUS
  Filled 2024-03-09: qty 1

## 2024-03-09 MED ORDER — AMISULPRIDE (ANTIEMETIC) 5 MG/2ML IV SOLN: 10.0000 mg | Freq: Once | INTRAVENOUS | Status: DC | PRN

## 2024-03-09 MED ORDER — ONDANSETRON HCL 4 MG/2ML IJ SOLN
4.0000 mg | Freq: Four times a day (QID) | INTRAMUSCULAR | Status: DC | PRN
  Administered 2024-03-09 – 2024-03-10 (×2): 4 mg via INTRAVENOUS
  Filled 2024-03-09 (×2): qty 2

## 2024-03-09 MED ORDER — MIDAZOLAM HCL (PF) 2 MG/2ML IJ SOLN
INTRAMUSCULAR | Status: DC | PRN
  Administered 2024-03-09: 2 mg via INTRAVENOUS

## 2024-03-09 MED ORDER — ALUM & MAG HYDROXIDE-SIMETH 200-200-20 MG/5ML PO SUSP
30.0000 mL | Freq: Once | ORAL | Status: AC
  Administered 2024-03-09: 30 mL via ORAL
  Filled 2024-03-09: qty 30

## 2024-03-09 MED ORDER — SODIUM CHLORIDE 0.9 % IV SOLN
1.0000 g | Freq: Once | INTRAVENOUS | Status: AC
  Administered 2024-03-09: 1 g via INTRAVENOUS
  Filled 2024-03-09: qty 10

## 2024-03-09 MED ORDER — SODIUM CHLORIDE 0.9 % IV SOLN
1.0000 g | INTRAVENOUS | Status: DC
  Filled 2024-03-09: qty 10

## 2024-03-09 MED ORDER — MELATONIN 5 MG PO TABS
5.0000 mg | ORAL_TABLET | Freq: Every evening | ORAL | Status: DC | PRN
  Administered 2024-03-09 – 2024-03-11 (×2): 5 mg via ORAL
  Filled 2024-03-09 (×2): qty 1

## 2024-03-09 MED ORDER — ACETAMINOPHEN 325 MG PO TABS
650.0000 mg | ORAL_TABLET | Freq: Four times a day (QID) | ORAL | Status: DC | PRN
  Administered 2024-03-10 – 2024-03-11 (×3): 650 mg via ORAL
  Filled 2024-03-09 (×3): qty 2

## 2024-03-09 MED ORDER — PANTOPRAZOLE SODIUM 40 MG IV SOLR
40.0000 mg | Freq: Two times a day (BID) | INTRAVENOUS | Status: DC
  Administered 2024-03-09 – 2024-03-11 (×4): 40 mg via INTRAVENOUS
  Filled 2024-03-09 (×4): qty 10

## 2024-03-09 MED ORDER — PHENYLEPHRINE 80 MCG/ML (10ML) SYRINGE FOR IV PUSH (FOR BLOOD PRESSURE SUPPORT)
PREFILLED_SYRINGE | INTRAVENOUS | Status: DC | PRN
  Administered 2024-03-09 (×2): 80 ug via INTRAVENOUS
  Administered 2024-03-09: 160 ug via INTRAVENOUS

## 2024-03-09 MED ORDER — ONDANSETRON HCL 4 MG/2ML IJ SOLN
INTRAMUSCULAR | Status: DC | PRN
  Administered 2024-03-09: 4 mg via INTRAVENOUS

## 2024-03-09 MED ORDER — THIAMINE MONONITRATE 100 MG PO TABS
100.0000 mg | ORAL_TABLET | Freq: Every day | ORAL | Status: DC
  Administered 2024-03-09 – 2024-03-11 (×3): 100 mg via ORAL
  Filled 2024-03-09 (×3): qty 1

## 2024-03-09 MED ORDER — SODIUM CHLORIDE 0.9 % IV SOLN
1.0000 g | Freq: Once | INTRAVENOUS | Status: AC
  Administered 2024-03-10: 1 g via INTRAVENOUS

## 2024-03-09 MED ORDER — LIDOCAINE 2% (20 MG/ML) 5 ML SYRINGE
INTRAMUSCULAR | Status: DC | PRN
  Administered 2024-03-09: 60 mg via INTRAVENOUS

## 2024-03-09 MED ORDER — SODIUM CHLORIDE 0.9 % IV BOLUS
1000.0000 mL | Freq: Once | INTRAVENOUS | Status: AC
  Administered 2024-03-09: 1000 mL via INTRAVENOUS

## 2024-03-09 SURGICAL SUPPLY — 17 items
BAG URO CATCHER STRL LF (MISCELLANEOUS) ×2 IMPLANT
BASKET ZERO TIP NITINOL 2.4FR (BASKET) IMPLANT
CATH URETL OPEN END 6FR 70 (CATHETERS) ×2 IMPLANT
CLOTH BEACON ORANGE TIMEOUT ST (SAFETY) ×2 IMPLANT
FIBER LASER MOSES 200 DFL (Laser) IMPLANT
GLOVE BIO SURGEON STRL SZ7.5 (GLOVE) ×2 IMPLANT
GOWN STRL REUS W/ TWL XL LVL3 (GOWN DISPOSABLE) ×2 IMPLANT
GUIDEWIRE STR DUAL SENSOR (WIRE) ×2 IMPLANT
GUIDEWIRE ZIPWRE .038 STRAIGHT (WIRE) IMPLANT
KIT TURNOVER KIT A (KITS) ×2 IMPLANT
MANIFOLD NEPTUNE II (INSTRUMENTS) ×2 IMPLANT
PACK CYSTO (CUSTOM PROCEDURE TRAY) ×2 IMPLANT
SHEATH NAVIGATOR HD 11/13X28 (SHEATH) IMPLANT
SHEATH NAVIGATOR HD 11/13X36 (SHEATH) IMPLANT
STENT URET 6FRX24 CONTOUR (STENTS) IMPLANT
TUBING CONNECTING 10 (TUBING) ×2 IMPLANT
TUBING UROLOGY SET (TUBING) ×2 IMPLANT

## 2024-03-09 NOTE — Anesthesia Postprocedure Evaluation (Signed)
 Anesthesia Post Note  Patient: Dawn Frost  Procedure(s) Performed: CYSTOSCOPY/URETEROSCOPY/HOLMIUM LASER/STENT PLACEMENT (Left: Ureter)     Patient location during evaluation: PACU Anesthesia Type: General Level of consciousness: awake and alert Pain management: pain level controlled Vital Signs Assessment: post-procedure vital signs reviewed and stable Respiratory status: spontaneous breathing, nonlabored ventilation, respiratory function stable and patient connected to nasal cannula oxygen Cardiovascular status: blood pressure returned to baseline and stable Postop Assessment: no apparent nausea or vomiting Anesthetic complications: no   No notable events documented.  Last Vitals:  Vitals:   03/09/24 1811 03/09/24 1857  BP: 134/74   Pulse: 96   Resp:  14  Temp: 36.7 C   SpO2: 93%     Last Pain:                 Garnette FORBES Skillern

## 2024-03-09 NOTE — Assessment & Plan Note (Signed)
 Came to ED with symptoms of her typical esophagitis with CT findings of severe distal esophagitis with periesophageal fluid and edema. She has been out of her carafate  for only one day -continue IV PPI BID -start back carafate  QID -IVF -anti-emetics  -EGD in 11/2023 with [facility]: Showing diffuse esophagitis from 33cm up to 20cm. Suggest a caustic esophogeal injury, classified as grade 1. Biopsy negative for H.pylori  -encouraged smoking cessation  -denies any NSAID use and alcohol only at thanksgiving  -consider GI consult if no improvement

## 2024-03-09 NOTE — Anesthesia Preprocedure Evaluation (Addendum)
 Anesthesia Evaluation  Patient identified by MRN, date of birth, ID band Patient awake    Reviewed: Allergy  & Precautions, NPO status , Patient's Chart, lab work & pertinent test results  Airway Mallampati: II  TM Distance: >3 FB Neck ROM: Full    Dental  (+) Dental Advisory Given, Edentulous Upper, Edentulous Lower   Pulmonary Current SmokerPatient did not abstain from smoking.   Pulmonary exam normal breath sounds clear to auscultation       Cardiovascular + dysrhythmias (WPW)  Rhythm:Regular Rate:Tachycardia     Neuro/Psych  Headaches PSYCHIATRIC DISORDERS Anxiety Depression       GI/Hepatic PUD,GERD  Medicated,,(+)     substance abuse    Endo/Other  Hypothyroidism  Addison's disease   Renal/GU Renal disease (LEFT URETERAL STONE; PYELONEPHRITIS)     Musculoskeletal negative musculoskeletal ROS (+)    Abdominal   Peds  (+) ATTENTION DEFICIT DISORDER WITHOUT HYPERACTIVITY Hematology negative hematology ROS (+)   Anesthesia Other Findings   Reproductive/Obstetrics                              Anesthesia Physical Anesthesia Plan  ASA: 3 and emergent  Anesthesia Plan: General   Post-op Pain Management: Ofirmev  IV (intra-op)*   Induction: Intravenous  PONV Risk Score and Plan: 2 and Midazolam , Dexamethasone  and Ondansetron   Airway Management Planned: Oral ETT  Additional Equipment:   Intra-op Plan:   Post-operative Plan: Extubation in OR  Informed Consent: I have reviewed the patients History and Physical, chart, labs and discussed the procedure including the risks, benefits and alternatives for the proposed anesthesia with the patient or authorized representative who has indicated his/her understanding and acceptance.     Dental advisory given  Plan Discussed with: CRNA  Anesthesia Plan Comments:          Anesthesia Quick Evaluation

## 2024-03-09 NOTE — Discharge Instructions (Signed)
 Removal of the stent: Remove the stent by pulling the string on Monday morning 03/11/2024 as instructed.  Follow-up with Dr. Nieves to ensure the left kidney recovers properly.

## 2024-03-09 NOTE — Anesthesia Procedure Notes (Signed)
 Procedure Name: Intubation Date/Time: 03/09/2024 4:49 PM  Performed by: Cena Epps, CRNAPre-anesthesia Checklist: Patient identified, Emergency Drugs available, Suction available and Patient being monitored Patient Re-evaluated:Patient Re-evaluated prior to induction Oxygen Delivery Method: Circle System Utilized Preoxygenation: Pre-oxygenation with 100% oxygen Induction Type: IV induction Ventilation: Mask ventilation without difficulty Laryngoscope Size: Mac and 3 Grade View: Grade I Tube type: Oral Tube size: 7.0 mm Number of attempts: 1 Airway Equipment and Method: Stylet and Oral airway Placement Confirmation: ETT inserted through vocal cords under direct vision, positive ETCO2 and breath sounds checked- equal and bilateral Secured at: 20 cm Tube secured with: Tape Dental Injury: Teeth and Oropharynx as per pre-operative assessment

## 2024-03-09 NOTE — Consult Note (Signed)
 Consultation: Left ureteral stone, leukocytosis possible pyelonephritis Requested by: Dr. Elsie Body  History of Present Illness: Dawn Frost is a 58 year old female with a history of kidney stones.  She had ureteroscopy with Dr. Andra around 2010.  About 2 weeks ago she developed fever chills, dysuria frequency and urgency that was treated with antibiotics for UTI.  Symptoms improved.  She had epigastric pain and came to emergency today.  CT scan revealed a 7 mm left distal stone without hydronephrosis but some patchy enhancement of the left kidney consistent with pyelonephritis.  She has had no further fever chills or dysuria.  The symptoms cleared with the antibiotics.  White count is 19, creatinine 0.59.  UA with rare bacteria, 21-50 red cells 21-50 white cell.  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HCC)    Anemia    Anxiety    Anxiety and depression    Chronic back pain    Chronic diarrhea    Clostridium difficile diarrhea    Collagenous colitis    Depression    Drug-seeking behavior    Hypokalemia    Irritable bowel syndrome (IBS)    Kidney stone    Kidney stone    Pancreatitis    Pneumonia due to COVID-19 virus 04/2020   hospitalized in [place]   Thyroid  disease    Vaginal delivery 8011,8004, 1998   WPW (Wolff-Parkinson-White syndrome)    Past Surgical History:  Procedure Laterality Date   ABDOMINAL HYSTERECTOMY  10/28/2013   CARDIAC ELECTROPHYSIOLOGY MAPPING AND ABLATION     for WPW   CHOLECYSTECTOMY     ESOPHAGOGASTRODUODENOSCOPY (EGD) WITH PROPOFOL  N/A 06/04/2014   Procedure: ESOPHAGOGASTRODUODENOSCOPY (EGD) WITH PROPOFOL ;  Surgeon: Oliva FORBES Boots, MD;  Location: [facility];  Service: Endoscopy;  Laterality: N/A;   LITHOTRIPSY     SALPINGOOPHORECTOMY Bilateral 10/28/2013   Procedure: SALPINGO OOPHORECTOMY;  Surgeon: Harland JAYSON Birkenhead, MD;  Location: [facility];  Service: Gynecology;  Laterality: Bilateral;   SPHINCTEROTOMY     TUBAL LIGATION     VAGINAL  HYSTERECTOMY  10/28/2013   Procedure: HYSTERECTOMY VAGINAL;  Surgeon: Harland JAYSON Birkenhead, MD;  Location: [facility];  Service: Gynecology;;   VIDEO BRONCHOSCOPY  08/31/2011   Procedure: VIDEO BRONCHOSCOPY WITH FLUORO;  Surgeon: Belvie FORBES Silvan, MD;  Location: [facility];  Service: Cardiopulmonary;  Laterality: N/A;    Home Medications:  (Not in a hospital admission)  Allergies:  Allergies  Allergen Reactions   Gabapentin Other (See Comments)    Severe confusion   Zolpidem  Other (See Comments)     Headaches   Amoxicillin Other (See Comments)    Unknown    Butrans [Buprenorphine] Other (See Comments)    Burned skin   Nsaids Other (See Comments)    Bad for colitis    Family History  Problem Relation Age of Onset   Coronary artery disease Maternal Grandfather    Prostate cancer Father    Social History:  reports that she has been smoking cigarettes. She has a 15 pack-year smoking history. She has never used smokeless tobacco. She reports that she does not currently use alcohol after a past usage of about 1.0 standard drink of alcohol per week. She reports that she does not use drugs.  ROS: A complete review of systems was performed.  All systems are negative except for pertinent findings as noted. Review of Systems  All other systems reviewed and are negative.    Physical Exam:  Vital signs in last 24 hours: Temp:  [  98.2 F (36.8 C)] 98.2 F (36.8 C) (12/06 1149) Pulse Rate:  [105-115] 105 (12/06 1149) Resp:  [18-21] 21 (12/06 1149) BP: (142-152)/(85-90) 142/90 (12/06 1149) SpO2:  [96 %-99 %] 96 % (12/06 1149) Weight:  [61.2 kg] 61.2 kg (12/06 0822) General:  Alert and oriented, No acute distress HEENT: Normocephalic, atraumatic Cardiovascular: Regular rate and rhythm Lungs: Regular rate and effort Abdomen: Soft, nontender, nondistended, no abdominal masses Back: No CVA tenderness Extremities: No edema Neurologic: Grossly intact  Laboratory Data:  Results for orders placed  or performed during the hospital encounter of 03/09/24 (from the past 24 hours)  I-stat chem 8, ED     Status: Abnormal   Collection Time: 03/09/24  9:57 AM  Result Value Ref Range   Sodium 136 135 - 145 mmol/L   Potassium 4.4 3.5 - 5.1 mmol/L   Chloride 102 98 - 111 mmol/L   BUN 9 6 - 20 mg/dL   Creatinine, Ser 9.29 0.44 - 1.00 mg/dL   Glucose, Bld 886 (H) 70 - 99 mg/dL   Calcium , Ion 1.18 1.15 - 1.40 mmol/L   TCO2 26 22 - 32 mmol/L   Hemoglobin 13.9 12.0 - 15.0 g/dL   HCT 58.9 63.9 - 53.9 %  Comprehensive metabolic panel     Status: Abnormal   Collection Time: 03/09/24 10:15 AM  Result Value Ref Range   Sodium 138 135 - 145 mmol/L   Potassium 4.2 3.5 - 5.1 mmol/L   Chloride 101 98 - 111 mmol/L   CO2 24 22 - 32 mmol/L   Glucose, Bld 110 (H) 70 - 99 mg/dL   BUN 9 6 - 20 mg/dL   Creatinine, Ser 9.40 0.44 - 1.00 mg/dL   Calcium  10.3 8.9 - 10.3 mg/dL   Total Protein 7.2 6.5 - 8.1 g/dL   Albumin 4.4 3.5 - 5.0 g/dL   AST 20 15 - 41 U/L   ALT 12 0 - 44 U/L   Alkaline Phosphatase 144 (H) 38 - 126 U/L   Total Bilirubin 0.4 0.0 - 1.2 mg/dL   GFR, Estimated >39 >39 mL/min   Anion gap 13 5 - 15  CBC with Differential     Status: Abnormal   Collection Time: 03/09/24 10:15 AM  Result Value Ref Range   WBC 19.3 (H) 4.0 - 10.5 K/uL   RBC 4.02 3.87 - 5.11 MIL/uL   Hemoglobin 12.9 12.0 - 15.0 g/dL   HCT 60.3 63.9 - 53.9 %   MCV 98.5 80.0 - 100.0 fL   MCH 32.1 26.0 - 34.0 pg   MCHC 32.6 30.0 - 36.0 g/dL   RDW 85.3 88.4 - 84.4 %   Platelets 319 150 - 400 K/uL   nRBC 0.0 0.0 - 0.2 %   Neutrophils Relative % 88 %   Neutro Abs 17.1 (H) 1.7 - 7.7 K/uL   Lymphocytes Relative 7 %   Lymphs Abs 1.4 0.7 - 4.0 K/uL   Monocytes Relative 4 %   Monocytes Absolute 0.7 0.1 - 1.0 K/uL   Eosinophils Relative 0 %   Eosinophils Absolute 0.1 0.0 - 0.5 K/uL   Basophils Relative 0 %   Basophils Absolute 0.1 0.0 - 0.1 K/uL   Immature Granulocytes 1 %   Abs Immature Granulocytes 0.09 (H) 0.00 - 0.07  K/uL  Lipase, blood     Status: None   Collection Time: 03/09/24 10:15 AM  Result Value Ref Range   Lipase 14 11 - 51 U/L  Urinalysis, w/ Reflex  to Culture (Infection Suspected) -Urine, Clean Catch     Status: Abnormal   Collection Time: 03/09/24 11:11 AM  Result Value Ref Range   Specimen Source URINE, CLEAN CATCH    Color, Urine YELLOW YELLOW   APPearance CLOUDY (A) CLEAR   Specific Gravity, Urine 1.045 (H) 1.005 - 1.030   pH 8.0 5.0 - 8.0   Glucose, UA NEGATIVE NEGATIVE mg/dL   Hgb urine dipstick NEGATIVE NEGATIVE   Bilirubin Urine NEGATIVE NEGATIVE   Ketones, ur NEGATIVE NEGATIVE mg/dL   Protein, ur NEGATIVE NEGATIVE mg/dL   Nitrite NEGATIVE NEGATIVE   Leukocytes,Ua LARGE (A) NEGATIVE   RBC / HPF 21-50 0 - 5 RBC/hpf   WBC, UA 21-50 0 - 5 WBC/hpf   Bacteria, UA RARE (A) NONE SEEN   Squamous Epithelial / HPF 0-5 0 - 5 /HPF   Mucus PRESENT    No results found for this or any previous visit (from the past 240 hours). Creatinine: Recent Labs    03/09/24 0957 03/09/24 1015  CREATININE 0.70 0.59    Impression/Assessment/plan:   Left ureteral stone, leukocytosis with residual versus resolving left pyelonephritis-history certainly sounds like stone passage treated as UTI.  The stone has settled and not causing hydronephrosis.  I discussed with the patient and her family the I discussed with the patient the nature, potential benefits, risks and alternatives to cystoscopy, left ureteroscopy with stent possible laser lithotripsy, including side effects of the proposed treatment, the likelihood of the patient achieving the goals of the procedure, and any potential problems that might occur during the procedure or recuperation.  We discussed alternative would be to continued stone passage but the stone could grow or cause obstruction, pain or fever again.  Also discussed if the stone is severely impacted or a lot of manipulation is required she may need a prestent/staged procedure.  All  questions answered. Patient elects to proceed with cystoscopy, left ureteroscopy, left stent with possible laser lithotripsy.    Donnice Brooks 03/09/2024

## 2024-03-09 NOTE — H&P (Addendum)
 History and Physical    Patient: Dawn Frost FMW:[id number] DOB: 08-30-1965 DOA: 03/09/2024 DOS: the patient was seen and examined on 03/09/2024 PCP: Patient, No Pcp Per  Patient coming from: Home - lives alone. Ambulates independently    Chief Complaint: epigastric pain and vomiting   HPI: Dawn Frost is a 58 y.o. female with medical history significant of Addison's disease, WPW syndrome, collagenous colitis with chronic diarrhea, chronic back pain, anxiety and depression, migraines, tobacco abuse, PUD and Barrett's esophagus with complaints of epigastric pain. She states around 11pm she had epigastric pain that radiated to her back. She states pain 10/10 but subsided with pain medication. She had associated nausea/vomiting. She ran out of carafate  since yesterday. She has been taking her PPI BID.   She drinks one pepsi/day. No NSAIDs. Denies any blood in her vomit and denies dark, tarry stools. She is more stressed. She states she has stopped drinking since August, but did drink wine at thanksgiving.   She denies any CVA tenderness, blood in her urine or other urinary symptoms. Was treated for a UTI around 02/18/24.   Admitted at [facility] in August 2025 for chest pain. EGD done during this stay. Showing diffuse esophagitis from 33cm up to 20cm. Suggest a caustic esophogeal injury, classified as grade 1. Biopsies taken. Placed on PPI BID and carafate  QID. Her biopsies were negative for H. Pylori.     Denies any fever/chills, vision changes/headaches, chest pain or palpitations, shortness of breath or cough, dysuria or leg swelling.   She smokes 1/2 PPD and drinks alcohol for social occasions.   ER Course:  vitals: afebrile, bp: 152/85, HR: 115, RR: 18, oxygen: 99%RA Pertinent labs: wbc: 19.3, UA with large LE, WBC: 21-50 and RBC: 21-50.  CXR: no acute findings CT abdomen/pelvis: 1. Severe distal esophagitis with periesophageal fluid and edema, without extraluminal gas. 2. Left  pyelonephritis. 3. Left distal ureteral stone measuring 0.7 cm at the proximal margin of the left ureterovesical junction without left hydronephrosis or hydroureter. 4. 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 5. Degenerative facet arthropathy at L4L5 and 3 mm grade 1 degenerative retrolisthesis at L2-L3. 6. Several additional chronic and incidental findings are present, including cholecystectomy, common bile duct measuring 8 mm similar to prior, mild aortoiliac atheromatous vascular calcification, normal appendix, and absent uterus. In ED: urology consulted. Given rocephin , protonix , 1L IVF, dilaudid . TRH asked to admit.     Review of Systems: As mentioned in the history of present illness. All other systems reviewed and are negative. 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HCC)    Anemia    Anxiety    Anxiety and depression    Chronic back pain    Chronic diarrhea    Clostridium difficile diarrhea    Collagenous colitis    Depression    Drug-seeking behavior    Hypokalemia    Irritable bowel syndrome (IBS)    Kidney stone    Kidney stone    Pancreatitis    Pneumonia due to COVID-19 virus 04/2020   hospitalized in [place]   Thyroid  disease    Vaginal delivery 8011,8004, 1998   WPW (Wolff-Parkinson-White syndrome)    Past Surgical History:  Procedure Laterality Date   ABDOMINAL HYSTERECTOMY  10/28/2013   CARDIAC ELECTROPHYSIOLOGY MAPPING AND ABLATION     for WPW   CHOLECYSTECTOMY     ESOPHAGOGASTRODUODENOSCOPY (EGD) WITH PROPOFOL  N/A 06/04/2014   Procedure: ESOPHAGOGASTRODUODENOSCOPY (  EGD) WITH PROPOFOL ;  Surgeon: Oliva FORBES Boots, MD;  Location: [facility];  Service: Endoscopy;  Laterality: N/A;   LITHOTRIPSY     SALPINGOOPHORECTOMY Bilateral 10/28/2013   Procedure: SALPINGO OOPHORECTOMY;  Surgeon: Harland JAYSON Birkenhead, MD;  Location: [facility];  Service: Gynecology;   Laterality: Bilateral;   SPHINCTEROTOMY     TUBAL LIGATION     VAGINAL HYSTERECTOMY  10/28/2013   Procedure: HYSTERECTOMY VAGINAL;  Surgeon: Harland JAYSON Birkenhead, MD;  Location: [facility];  Service: Gynecology;;   VIDEO BRONCHOSCOPY  08/31/2011   Procedure: VIDEO BRONCHOSCOPY WITH FLUORO;  Surgeon: Belvie FORBES Silvan, MD;  Location: [facility];  Service: Cardiopulmonary;  Laterality: N/A;   Social History:  reports that she has been smoking cigarettes. She has a 15 pack-year smoking history. She has never used smokeless tobacco. She reports that she does not currently use alcohol after a past usage of about 1.0 standard drink of alcohol per week. She reports that she does not use drugs.  Allergies  Allergen Reactions   Gabapentin Other (See Comments)    Severe confusion   Zolpidem  Other (See Comments)     Headaches   Amoxicillin Other (See Comments)    Unknown    Butrans [Buprenorphine] Other (See Comments)    Burned skin   Nsaids Other (See Comments)    Bad for colitis    Family History  Problem Relation Age of Onset   Coronary artery disease Maternal Grandfather    Prostate cancer Father     Prior to Admission medications   Medication Sig Start Date End Date Taking? Authorizing Provider  albuterol  (VENTOLIN  HFA) 108 (90 Base) MCG/ACT inhaler Inhale 1-2 puffs into the lungs every 6 (six) hours as needed for wheezing or shortness of breath. 09/23/23   Maranda Jamee Jacob, MD  escitalopram  (LEXAPRO ) 20 MG tablet Take 20 mg by mouth daily. 09/07/20   [provider]  lisdexamfetamine (VYVANSE ) 50 MG capsule Take 50 mg by mouth daily.    [provider]  pantoprazole  (PROTONIX ) 40 MG tablet Take 40 mg by mouth 2 (two) times daily. 10/24/17   [provider]  promethazine  (PHENERGAN ) 25 MG tablet Take 25 mg by mouth every 8 (eight) hours as needed for nausea or vomiting.    [provider]  sucralfate  (CARAFATE ) 1 g tablet Take 1 g by mouth 4 (four) times daily -   with meals and at bedtime.    [provider]    Physical Exam: Vitals:   03/09/24 0822 03/09/24 1149  BP: (!) 152/85 (!) 142/90  Pulse: (!) 115 (!) 105  Resp: 18 (!) 21  Temp: 98.2 F (36.8 C) 98.2 F (36.8 C)  TempSrc: Oral Oral  SpO2: 99% 96%  Weight: 61.2 kg   Height: 5' 4 (1.626 m)    General:  Appears calm and comfortable and is in NAD Eyes:  PERRL, EOMI, normal lids, iris ENT:  grossly normal hearing, lips & tongue, mmm; edentulous  Neck:  no LAD, masses or thyromegaly; no carotid bruits Cardiovascular:  RRR, no m/r/g. No LE edema.  Respiratory:   CTA bilaterally with no wheezes/rales/rhonchi.  Normal respiratory effort. Abdomen:  TTP in epigastric area. BS+ no rebound or gaurding  Back:   normal alignment, no CVAT Skin:  no rash or induration seen on limited exam Musculoskeletal:  grossly normal tone BUE/BLE, good ROM, no bony abnormality Lower extremity:  No LE edema.  Limited foot exam with no ulcerations.  2+ distal pulses. Psychiatric:  grossly normal mood and affect, speech fluent and appropriate, AOx3 Neurologic:  CN 2-12 grossly intact, moves all extremities in coordinated fashion, sensation intact   Radiological Exams on Admission: Independently reviewed - see discussion in A/P where applicable  CT ABDOMEN PELVIS W CONTRAST Result Date: 03/09/2024 EXAM: CT ABDOMEN AND PELVIS WITH CONTRAST 03/09/2024 10:21:18 AM TECHNIQUE: CT of the abdomen and pelvis was performed with the administration of 90 cc Omnipaque  300 intravenous contrast. Multiplanar reformatted images are provided for review. Automated exposure control, iterative reconstruction, and/or weight-based adjustment of the mA/kV was utilized to reduce the radiation dose to as low as reasonably achievable. COMPARISON: 12/22/2015 CLINICAL HISTORY: Epigastric pain with nausea and vomiting starting last night. FINDINGS: LOWER CHEST: Prominent distal esophageal wall thickening observed with periesophageal  fluid/edema. The esophageal wall thickening extends cephalad to the topmost image. Although nonspecific, the appearance favors severe distal esophagitis. No extraluminal gas is present along the distal esophagus. LIVER: The liver is unremarkable. GALLBLADDER AND BILE DUCTS: Cholecystectomy. Common bile duct 8 mm in diameter, similar to 12/22/2015. SPLEEN: No acute abnormality. 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 No current substantial peripancreatic edema/stranding. ADRENAL GLANDS: No acute abnormality. KIDNEYS, URETERS AND BLADDER: 0.7 cm left distal ureteral stone along the proximal margin of the left ureterovesical junction (UVJ), image 74 series 2. However, there is no left hydronephrosis or hydroureter. Hypoenhancing segments of the left kidney especially anterolaterally, without cortical rim sign, and accordingly compatible with left pyelonephritis. Urinary bladder is unremarkable. GI AND BOWEL: Stomach demonstrates no acute abnormality. Normal appendix. There is no bowel obstruction. PERITONEUM AND RETROPERITONEUM: No ascites. No free air. VASCULATURE: Mild aortoiliac atheromatous vascular calcification. LYMPH NODES: No lymphadenopathy. REPRODUCTIVE ORGANS: Uterus absent. BONES AND SOFT TISSUES: Degenerative facet arthropathy bilaterally at L4-L5. 3 mm grade 1 degenerative retrolisthesis at L2-L3. No focal soft tissue abnormality. IMPRESSION: 1. Severe distal esophagitis with periesophageal fluid and edema, without extraluminal gas. 2. Left pyelonephritis. 3. Left distal ureteral stone measuring 0.7 cm at the proximal margin of the left ureterovesical junction without left hydronephrosis or hydroureter. 4. 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 5. Degenerative facet arthropathy at L4L5 and 3 mm grade 1 degenerative  retrolisthesis at [facility]. 6. Several additional chronic and incidental findings are present, including cholecystectomy, common bile duct measuring 8 mm similar to prior, mild aortoiliac atheromatous vascular calcification, normal appendix, and absent uterus. Electronically signed by: Ryan Salvage MD 03/09/2024 10:37 AM EST RP Workstation: [id number]   DG Chest Port 1 View Result Date: 03/09/2024 EXAM: 1 VIEW(S) XRAY OF THE CHEST 03/09/2024 09:35:00 AM COMPARISON: 09/23/2023 CLINICAL HISTORY: acute chest pain and epigastric pain. FINDINGS: LUNGS AND PLEURA: No focal pulmonary opacity. No pleural effusion. No pneumothorax. HEART AND MEDIASTINUM: No acute abnormality of the cardiac and mediastinal silhouettes. BONES AND SOFT TISSUES: No acute osseous abnormality. IMPRESSION: 1. No acute cardiopulmonary process. Electronically signed by: Ryan Salvage MD 03/09/2024 10:21 AM EST RP Workstation: [id number]    EKG: Independently reviewed.  Sinus tachycardia with rate 105; nonspecific ST changes with no evidence of acute ischemia   Labs on Admission: I have personally reviewed the available labs and imaging studies at the time of the admission.  Pertinent labs:   wbc: 19.3,  UA with large LE, WBC: 21-50 and RBC: 21-50.   Assessment and Plan: Principal Problem:   Left ureteral stone Active Problems:   Esophagitis   Leukocytosis  chronic diarrhea from collagenous colitis   Anxiety and depression   WOLFF (Tela Kotecki)-PARKINSON-WHITE (WPW) SYNDROME   Tobacco abuse   Sepsis (HCC)    Assessment and Plan: * Left ureteral stone 58 year old presenting to ED with one day history of acute epigastric pain with radiation to her back and nausea with vomiting found to have incidental finding of left ureteral stone with possible pyelo meeting sepsis criteria with leukocytosis of 19.2 and tachycardia  -obs to tele  -she has no urinary symptoms, but hx of UTI treated in 11/16.  -urology consulted with  plans for OR with stent today  -NPO, SCDs (last ate yesterday evening)  -continue rocephin  -anti emetics  -urine culture pending  -continue IVF  -pain control with morphine   -f/u with urology plan   Esophagitis Came to ED with symptoms of her typical esophagitis with CT findings of severe distal esophagitis with periesophageal fluid and edema. She has been out of her carafate  for only one day -continue IV PPI BID -start back carafate  QID -IVF -anti-emetics  -EGD in 11/2023 with [facility]: Showing diffuse esophagitis from 33cm up to 20cm. Suggest a caustic esophogeal injury, classified as grade 1. Biopsy negative for H.pylori  -encouraged smoking cessation  -denies any NSAID use and alcohol only at thanksgiving  -consider GI consult if no improvement   Leukocytosis Concerning for possible source of pyelo/stone off imaging Meets sepsis criteria with tachycardia and leukocytosis  Continue rocephin , going to OR today for stent  Continue IVF BC for fever  Trend   chronic diarrhea from collagenous colitis Stable   Anxiety and depression Continue lexapro    WOLFF (Rubert Frediani)-PARKINSON-WHITE (WPW) SYNDROME S/p ablation in 1995   Tobacco abuse Smoking 1/2 PPD Declines nicotine  patch     Advance Care Planning:   Code Status: Full Code     Consultants: Urology: Dr. Nieves    Procedures: OR with Dr. Nieves    Antibiotics: Rocephin : 12/6     DVT Prophylaxis: SCDs   Family Communication: daughter at bedside   Severity of Illness: The appropriate patient status for this patient is OBSERVATION. Observation status is judged to be reasonable and necessary in order to provide the required intensity of service to ensure the patient's safety. The patient's presenting symptoms, physical exam findings, and initial radiographic and laboratory data in the context of their medical condition is felt to place them at decreased risk for further clinical deterioration. Furthermore, it is  anticipated that the patient will be medically stable for discharge from the hospital within 2 midnights of admission.   Author: Isaiah Geralds, MD 03/09/2024 1:42 PM  For on call review [email or web address].

## 2024-03-09 NOTE — Assessment & Plan Note (Signed)
 Concerning for possible source of pyelo/stone off imaging Meets sepsis criteria with tachycardia and leukocytosis  Continue rocephin , going to OR today for stent  Continue IVF BC for fever  Trend

## 2024-03-09 NOTE — Assessment & Plan Note (Signed)
-   Continue lexapro

## 2024-03-09 NOTE — ED Provider Notes (Signed)
 [place] EMERGENCY DEPARTMENT AT [facility] Provider Note  CSN: [id number] Arrival date & time: 03/09/24 9186  Chief Complaint(s) Abdominal Pain  HPI Dawn Frost is a 58 y.o. female history of Barrett's esophagus presenting to the emergency department with epigastric pain.  Patient reports epigastric pain.  Reports associated nausea vomiting, was also had some diarrhea.  Pain radiates to the back.  No fevers or chills.  No urinary symptoms.  No shortness of breath.  Feels somewhat similar to prior hospitalization where she had esophagitis.  Denies recent alcohol use, drinks some on Thanksgiving but none since.  Reports prior history of gallstone pancreatitis but has had a gallbladder removal   Past Medical History 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HCC)    Anemia    Anxiety    Anxiety and depression    Chronic back pain    Chronic diarrhea    Clostridium difficile diarrhea    Collagenous colitis    Depression    Drug-seeking behavior    Hypokalemia    Irritable bowel syndrome (IBS)    Kidney stone    Kidney stone    Pancreatitis    Pneumonia due to COVID-19 virus 04/2020   hospitalized in [place]   Thyroid  disease    Vaginal delivery 8011,8004, 1998   WPW (Wolff-Parkinson-White syndrome)    Patient Active Problem List   Diagnosis Date Noted   Anxiety and depression 03/09/2024   Left ureteral stone 03/09/2024   Intractable nausea and vomiting 08/16/2014   Macrocytic anemia 08/16/2014   Peptic ulcer of duodenum 06/02/2014   Epigastric pain 06/02/2014   Nausea & vomiting 06/02/2014   Insomnia 01/07/2014   Migraine without aura 01/07/2014   Adenomyosis 08/08/2013   Anxiety disorder 03/22/2012   Panic disorder 03/22/2012   Leukocytosis 01/30/2012   Dehydration 01/30/2012   Abdominal pain 01/06/2012   Tobacco abuse 01/06/2012   Hypersensitivity pneumonitis (HCC) 08/31/2011   Chronic diarrhea 08/28/2011   Collagenous colitis  08/28/2011   Chest pain 08/28/2011   Diarrhea 06/09/2011   Esophagitis 02/11/2011   Lymphocytic colitis 02/06/2011   Adrenal insufficiency 02/06/2011   Hypothyroid 02/06/2011   DEPRESSION 06/20/2010   COSTOCHONDRITIS, ACUTE 03/14/2010   WOLFF (WOLFE)-PARKINSON-WHITE (WPW) SYNDROME 01/14/2009   Acute gastritis 01/14/2009   WEIGHT LOSS, RECENT 01/14/2009   ANEMIA, MACROCYTIC, HX OF 01/14/2009   Home Medication(s) Prior to Admission medications   Medication Sig Start Date End Date Taking? Authorizing Provider  albuterol  (VENTOLIN  HFA) 108 (90 Base) MCG/ACT inhaler Inhale 1-2 puffs into the lungs every 6 (six) hours as needed for wheezing or shortness of breath. 09/23/23   Maranda Jamee Jacob, MD  escitalopram  (LEXAPRO ) 20 MG tablet Take 20 mg by mouth daily. 09/07/20   [provider]  lisdexamfetamine (VYVANSE ) 50 MG capsule Take 50 mg by mouth daily.    [provider]  pantoprazole  (PROTONIX ) 40 MG tablet Take 40 mg by mouth 2 (two) times daily. 10/24/17   [provider]  promethazine  (PHENERGAN ) 25 MG tablet Take 25 mg by mouth every 8 (eight) hours as needed for nausea or vomiting.    [provider]  sucralfate  (CARAFATE ) 1 g tablet Take 1 g by mouth 4 (four) times daily -  with meals and at bedtime.    [provider]  Past Surgical History Past Surgical History:  Procedure Laterality Date   ABDOMINAL HYSTERECTOMY  10/28/2013   CARDIAC ELECTROPHYSIOLOGY MAPPING AND ABLATION     for WPW   CHOLECYSTECTOMY     ESOPHAGOGASTRODUODENOSCOPY (EGD) WITH PROPOFOL  N/A 06/04/2014   Procedure: ESOPHAGOGASTRODUODENOSCOPY (EGD) WITH PROPOFOL ;  Surgeon: Oliva FORBES Boots, MD;  Location: [facility];  Service: Endoscopy;  Laterality: N/A;   LITHOTRIPSY     SALPINGOOPHORECTOMY Bilateral 10/28/2013   Procedure: SALPINGO OOPHORECTOMY;   Surgeon: Harland JAYSON Birkenhead, MD;  Location: [facility];  Service: Gynecology;  Laterality: Bilateral;   SPHINCTEROTOMY     TUBAL LIGATION     VAGINAL HYSTERECTOMY  10/28/2013   Procedure: HYSTERECTOMY VAGINAL;  Surgeon: Harland JAYSON Birkenhead, MD;  Location: [facility];  Service: Gynecology;;   VIDEO BRONCHOSCOPY  08/31/2011   Procedure: VIDEO BRONCHOSCOPY WITH FLUORO;  Surgeon: Belvie FORBES Silvan, MD;  Location: [facility];  Service: Cardiopulmonary;  Laterality: N/A;   Family History Family History  Problem Relation Age of Onset   Coronary artery disease Maternal Grandfather    Prostate cancer Father     Social History Social History   Tobacco Use   Smoking status: Every Day    Current packs/day: 0.50    Average packs/day: 0.5 packs/day for 30.0 years (15.0 ttl pk-yrs)    Types: Cigarettes   Smokeless tobacco: Never  Vaping Use   Vaping status: Never Used  Substance Use Topics   Alcohol use: Not Currently    Alcohol/week: 1.0 standard drink of alcohol    Types: 1 Glasses of wine per week   Drug use: No    Types: Barbituates   Allergies Amoxicillin, Butrans [buprenorphine], and Nsaids  Review of Systems Review of Systems  All other systems reviewed and are negative.   Physical Exam Vital Signs  I have reviewed the triage vital signs BP (!) 142/90   Pulse (!) 105   Temp 98.2 F (36.8 C) (Oral)   Resp (!) 21   Ht 5' 4 (1.626 m)   Wt 61.2 kg   LMP 08/06/2013   SpO2 96%   BMI 23.17 kg/m  Physical Exam Vitals and nursing note reviewed.  Constitutional:      General: She is not in acute distress.    Appearance: She is well-developed.  HENT:     Head: Normocephalic and atraumatic.     Mouth/Throat:     Mouth: Mucous membranes are dry.  Eyes:     Pupils: Pupils are equal, round, and reactive to light.  Cardiovascular:     Rate and Rhythm: Normal rate and regular rhythm.     Heart sounds: No murmur heard. Pulmonary:     Effort: Pulmonary effort is normal. No respiratory distress.      Breath sounds: Normal breath sounds.  Abdominal:     General: Abdomen is flat.     Palpations: Abdomen is soft.     Tenderness: There is abdominal tenderness in the epigastric area.  Musculoskeletal:        General: No tenderness.     Right lower leg: No edema.     Left lower leg: No edema.  Skin:    General: Skin is warm and dry.  Neurological:     General: No focal deficit present.     Mental Status: She is alert. Mental status is at baseline.  Psychiatric:        Mood and Affect: Mood normal.        Behavior: Behavior normal.  ED Results and Treatments Labs (all labs ordered are listed, but only abnormal results are displayed) Labs Reviewed  COMPREHENSIVE METABOLIC PANEL WITH GFR - Abnormal; Notable for the following components:      Result Value   Glucose, Bld 110 (*)    Alkaline Phosphatase 144 (*)    All other components within normal limits  CBC WITH DIFFERENTIAL/PLATELET - Abnormal; Notable for the following components:   WBC 19.3 (*)    Neutro Abs 17.1 (*)    Abs Immature Granulocytes 0.09 (*)    All other components within normal limits  URINALYSIS, W/ REFLEX TO CULTURE (INFECTION SUSPECTED) - Abnormal; Notable for the following components:   APPearance CLOUDY (*)    Specific Gravity, Urine 1.045 (*)    Leukocytes,Ua LARGE (*)    Bacteria, UA RARE (*)    All other components within normal limits  I-STAT CHEM 8, ED - Abnormal; Notable for the following components:   Glucose, Bld 113 (*)    All other components within normal limits  URINE CULTURE  LIPASE, BLOOD                                                                                                                          Radiology CT ABDOMEN PELVIS W CONTRAST Result Date: 03/09/2024 EXAM: CT ABDOMEN AND PELVIS WITH CONTRAST 03/09/2024 10:21:18 AM TECHNIQUE: CT of the abdomen and pelvis was performed with the administration of 90 cc Omnipaque  300 intravenous contrast. Multiplanar reformatted  images are provided for review. Automated exposure control, iterative reconstruction, and/or weight-based adjustment of the mA/kV was utilized to reduce the radiation dose to as low as reasonably achievable. COMPARISON: 12/22/2015 CLINICAL HISTORY: Epigastric pain with nausea and vomiting starting last night. FINDINGS: LOWER CHEST: Prominent distal esophageal wall thickening observed with periesophageal fluid/edema. The esophageal wall thickening extends cephalad to the topmost image. Although nonspecific, the appearance favors severe distal esophagitis. No extraluminal gas is present along the distal esophagus. LIVER: The liver is unremarkable. GALLBLADDER AND BILE DUCTS: Cholecystectomy. Common bile duct 8 mm in diameter, similar to 12/22/2015. SPLEEN: No acute abnormality. 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 No current substantial peripancreatic edema/stranding. ADRENAL GLANDS: No acute abnormality. KIDNEYS, URETERS AND BLADDER: 0.7 cm left distal ureteral stone along the proximal margin of the left ureterovesical junction (UVJ), image 74 series 2. However, there is no left hydronephrosis or hydroureter. Hypoenhancing segments of the left kidney especially anterolaterally, without cortical rim sign, and accordingly compatible with left pyelonephritis. Urinary bladder is unremarkable. GI AND BOWEL: Stomach demonstrates no acute abnormality. Normal appendix. There is no bowel obstruction. PERITONEUM AND RETROPERITONEUM: No ascites. No free air. VASCULATURE: Mild aortoiliac atheromatous vascular calcification. LYMPH NODES: No lymphadenopathy. REPRODUCTIVE ORGANS: Uterus absent. BONES AND SOFT TISSUES: Degenerative facet arthropathy bilaterally at L4-L5. 3 mm grade 1 degenerative retrolisthesis at L2-L3. No focal soft tissue abnormality. IMPRESSION: 1. Severe distal esophagitis with periesophageal fluid and edema, without  extraluminal gas.  2. Left pyelonephritis. 3. Left distal ureteral stone measuring 0.7 cm at the proximal margin of the left ureterovesical junction without left hydronephrosis or hydroureter. 4. 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 5. Degenerative facet arthropathy at L4L5 and 3 mm grade 1 degenerative retrolisthesis at [facility]. 6. Several additional chronic and incidental findings are present, including cholecystectomy, common bile duct measuring 8 mm similar to prior, mild aortoiliac atheromatous vascular calcification, normal appendix, and absent uterus. Electronically signed by: Ryan Salvage MD 03/09/2024 10:37 AM EST RP Workstation: [id number]   DG Chest Port 1 View Result Date: 03/09/2024 EXAM: 1 VIEW(S) XRAY OF THE CHEST 03/09/2024 09:35:00 AM COMPARISON: 09/23/2023 CLINICAL HISTORY: acute chest pain and epigastric pain. FINDINGS: LUNGS AND PLEURA: No focal pulmonary opacity. No pleural effusion. No pneumothorax. HEART AND MEDIASTINUM: No acute abnormality of the cardiac and mediastinal silhouettes. BONES AND SOFT TISSUES: No acute osseous abnormality. IMPRESSION: 1. No acute cardiopulmonary process. Electronically signed by: Ryan Salvage MD 03/09/2024 10:21 AM EST RP Workstation: [id number]    Pertinent labs & imaging results that were available during my care of the patient were reviewed by me and considered in my medical decision making (see MDM for details).  Medications Ordered in ED Medications  cefTRIAXone  (ROCEPHIN ) 1 g in sodium chloride  0.9 % 100 mL IVPB (1 g Intravenous New Bag/Given 03/09/24 1244)  sodium chloride  0.9 % bolus 1,000 mL (0 mLs Intravenous Stopped 03/09/24 1245)  ondansetron  (ZOFRAN ) injection 4 mg (4 mg Intravenous Given 03/09/24 1008)  HYDROmorphone  (DILAUDID ) injection 0.5 mg (0.5 mg Intravenous Given 03/09/24 1009)  iohexol  (OMNIPAQUE ) 300 MG/ML solution 100 mL (90 mLs  Intravenous Contrast Given 03/09/24 1010)  pantoprazole  (PROTONIX ) injection 40 mg (40 mg Intravenous Given 03/09/24 1112)  alum & mag hydroxide-simeth (MAALOX/MYLANTA) 200-200-20 MG/5ML suspension 30 mL (30 mLs Oral Given 03/09/24 1111)  HYDROmorphone  (DILAUDID ) injection 0.5 mg (0.5 mg Intravenous Given 03/09/24 1109)                                                                                                                                     Procedures Procedures  (including critical care time)  Medical Decision Making / ED Course   MDM:  58 year old presenting to the emergency department with abdominal pain.  Patient appears uncomfortable.  Vitals with tachycardia.  Exam with epigastric tenderness.  Differential includes pancreatitis, esophagitis, gastritis, GERD, choledocholithiasis, SBO, perforation.  Will obtain CT scan.  Doubt other process such as ACS or cardiopulmonary cause of symptoms but will check EKG, chest x-ray.  Will treat her symptoms and reassess.  Clinical Course as of 03/09/24 1254  Sat Mar 09, 2024  1220 CT scan shows esophagitis.  Patient received PPI, GI cocktail, additional pain control.  Reports symptoms have now resolved.  CT was incidentally notable for left UVJ stone which was 7 mm, as well as  possible left kidney pyelonephritis.  Patient has no CVA tenderness or lower abdominal pain.  Was recently treated for UTI, urinalysis shows evidence of persistent infection.  She has no hydronephrosis but given size of stone and possible UTI discussed with Dr. Nieves, recommends hospital admission, n.p.o. for now.  He will come and see patient.  Will sign out to hospitalist. [WS]  1251 Signed out to Dr. Waddell for admission [WS]    Clinical Course User Index [WS] Francesca Elsie CROME, MD     Additional history obtained: -Additional history obtained from family -External records from outside source obtained and reviewed including: Chart review including previous  notes, labs, imaging, consultation notes including prior notes    Lab Tests: -I ordered, reviewed, and interpreted labs.   The pertinent results include:   Labs Reviewed  COMPREHENSIVE METABOLIC PANEL WITH GFR - Abnormal; Notable for the following components:      Result Value   Glucose, Bld 110 (*)    Alkaline Phosphatase 144 (*)    All other components within normal limits  CBC WITH DIFFERENTIAL/PLATELET - Abnormal; Notable for the following components:   WBC 19.3 (*)    Neutro Abs 17.1 (*)    Abs Immature Granulocytes 0.09 (*)    All other components within normal limits  URINALYSIS, W/ REFLEX TO CULTURE (INFECTION SUSPECTED) - Abnormal; Notable for the following components:   APPearance CLOUDY (*)    Specific Gravity, Urine 1.045 (*)    Leukocytes,Ua LARGE (*)    Bacteria, UA RARE (*)    All other components within normal limits  I-STAT CHEM 8, ED - Abnormal; Notable for the following components:   Glucose, Bld 113 (*)    All other components within normal limits  URINE CULTURE  LIPASE, BLOOD    Notable for leukocytosis, sign of UTI  EKG   EKG Interpretation Date/Time:  Saturday March 09 2024 09:12:17 EST Ventricular Rate:  105 PR Interval:  168 QRS Duration:  81 QT Interval:  351 QTC Calculation: 464 R Axis:   -16  Text Interpretation: Sinus tachycardia Borderline left axis deviation Anterior infarct, old Confirmed by Francesca Elsie ([id number]) on 03/09/2024 9:21:40 AM         Imaging Studies ordered: I ordered imaging studies including CT abdomen On my interpretation imaging demonstrates esophagitis, renal stone I independently visualized and interpreted imaging. I agree with the radiologist interpretation   Medicines ordered and prescription drug management: Meds ordered this encounter  Medications   sodium chloride  0.9 % bolus 1,000 mL   ondansetron  (ZOFRAN ) injection 4 mg   HYDROmorphone  (DILAUDID ) injection 0.5 mg   iohexol  (OMNIPAQUE ) 300 MG/ML  solution 100 mL   pantoprazole  (PROTONIX ) injection 40 mg   alum & mag hydroxide-simeth (MAALOX/MYLANTA) 200-200-20 MG/5ML suspension 30 mL   HYDROmorphone  (DILAUDID ) injection 0.5 mg   cefTRIAXone  (ROCEPHIN ) 1 g in sodium chloride  0.9 % 100 mL IVPB    Antibiotic Indication::   UTI    -I have reviewed the patients home medicines and have made adjustments as needed   Consultations Obtained: I requested consultation with the urology,  and discussed lab and imaging findings as well as pertinent plan - they recommend: Admit   Cardiac Monitoring: The patient was maintained on a cardiac monitor.  I personally viewed and interpreted the cardiac monitored which showed an underlying rhythm of: Normal sinus rhythm  Social Determinants of Health:  Diagnosis or treatment significantly limited by social determinants of health: current smoker   Reevaluation: After  the interventions noted above, I reevaluated the patient and found that their symptoms have improved  Co morbidities that complicate the patient evaluation  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HCC)    Anemia    Anxiety    Anxiety and depression    Chronic back pain    Chronic diarrhea    Clostridium difficile diarrhea    Collagenous colitis    Depression    Drug-seeking behavior    Hypokalemia    Irritable bowel syndrome (IBS)    Kidney stone    Kidney stone    Pancreatitis    Pneumonia due to COVID-19 virus 04/2020   hospitalized in [place]   Thyroid  disease    Vaginal delivery 8011,8004, 1998   WPW (Wolff-Parkinson-White syndrome)       Dispostion: Disposition decision including need for hospitalization was considered, and patient admitted to the hospital.    Final Clinical Impression(s) / ED Diagnoses Final diagnoses:  Esophagitis  Acute cystitis with hematuria  Ureteral stone     This chart was dictated using voice recognition software.  Despite best efforts to proofread,  errors can  occur which can change the documentation meaning.    Francesca Elsie CROME, MD 03/09/24 1254

## 2024-03-09 NOTE — Assessment & Plan Note (Addendum)
 58 year old presenting to ED with one day history of acute epigastric pain with radiation to her back and nausea with vomiting found to have incidental finding of left ureteral stone with possible pyelo meeting sepsis criteria with leukocytosis of 19.2 and tachycardia  -obs to tele  -she has no urinary symptoms, but hx of UTI treated in 11/16.  -urology consulted with plans for OR with stent today  -NPO, SCDs (last ate yesterday evening)  -continue rocephin  -anti emetics  -urine culture pending  -continue IVF  -pain control with morphine   -f/u with urology plan

## 2024-03-09 NOTE — Assessment & Plan Note (Signed)
 Smoking 1/2 PPD Declines nicotine  patch

## 2024-03-09 NOTE — Op Note (Addendum)
 Preoperative diagnosis: Left ureteral stone, leukocytosis, residual or resolving left pyelonephritis Postoperative diagnosis: Same  Procedure: Cystoscopy with left retrograde pyelogram, left ureteroscopy, stone basket extraction, left ureteral stent placement  Surgeon: Nieves  Anesthesia: General  Indication for procedure: Dawn Frost is a 58 year old female who developed fever and chills, dysuria and frequency 2 weeks ago.  She was treated with antibiotics.  Presented today with epigastric pain and CT demonstrated esophagitis but also left distal ureteral stone and patchy hypoenhancement of the left kidney consistent with pyelonephritis.  There was no hydronephrosis.  Urine was bland.  Discussed management options including surveillance with continued stone passage versus cystoscopy with left stent with attempt at left ureteroscopy and laser lithotripsy.  Patient elected to proceed with stent and ureteroscopy/HLL if feasible.  Findings: On exam the vulva appeared normal without lesion.  The meatus and the vagina appeared normal. On cystoscopy, the urethra was unremarkable, bladder unremarkable without lesion.  No stone or foreign body in the bladder.  Ureteral orifices were in the normal orthotopic position.  Left retrograde pyelogram-this outlined a single ureter single collecting system unit with a filling defect at the UVJ consistent with the stone with some mild dilation proximally of the ureter and collecting system.  Left ureteroscopy confirmed the stone in the left distal ureter where it was grasped with a ZeroTip basket and removed intact without difficulty.  Description of procedure: After consent was obtained patient brought to the operating room.  After adequate anesthesia she was placed in lithotomy position and prepped and draped in the usual sterile fashion.  Timeout was performed to confirm the patient and procedure.  The cystoscope was passed per urethra and the bladder inspected.   The left ureteral orifice was cannulated with an open-ended catheter and left retrograde injection of contrast was performed.  Then a sensor wire was advanced and coiled in the upper calyx.  A dual channel semirigid ureteroscope was advanced adjacent to the wire and went into the distal ureter without difficulty where the stone was located.  The stone was then snared with a ZeroTip basket and arranged to be in line with the scope and was extracted without difficulty.  Reinspection of the ureter up into the mid ureter noted there to be no other stone and no injury.  The scope was backed out and the wire was backloaded on the cystoscope and a 624 cm stent advanced.  The wire was removed with a good coil seen in the kidney and a good coil in the bladder.  The bladder was drained and the scope removed.  The string was taped to the patient.  She was awakened and taken to the cover room in stable condition.  Complications: None  Blood loss: None  Specimens: None-stone to patient.  Drains: 6 x 24 cm left ureteral stent with string  Disposition: Stable to PACU.  I spoke with Dawn Frost and discussed procedure, post-op care and f/u (OV for UA and US ).

## 2024-03-09 NOTE — Assessment & Plan Note (Signed)
 Stable

## 2024-03-09 NOTE — ED Triage Notes (Signed)
 Patient c/o epigastric pain with n/v since 11 pm last night Denies diarrhea/fever/chills Pain rated 10/10

## 2024-03-09 NOTE — Transfer of Care (Signed)
 Immediate Anesthesia Transfer of Care Note  Patient: Dawn Frost  Procedure(s) Performed: CYSTOSCOPY/URETEROSCOPY/HOLMIUM LASER/STENT PLACEMENT (Left: Ureter)  Patient Location: PACU  Anesthesia Type:General  Level of Consciousness: awake, alert , and oriented  Airway & Oxygen Therapy: Patient Spontanous Breathing  Post-op Assessment: Report given to RN and Post -op Vital signs reviewed and stable  Post vital signs: Reviewed and stable  Last Vitals:  Vitals Value Taken Time  BP 133/87 03/09/24 17:31  Temp 36.4 C 03/09/24 17:30  Pulse 101 03/09/24 17:33  Resp 20 03/09/24 17:33  SpO2 90 % 03/09/24 17:33  Vitals shown include unfiled device data.  Last Pain:  Vitals:   03/09/24 1356  TempSrc:   PainSc: 9          Complications: No notable events documented.

## 2024-03-09 NOTE — Assessment & Plan Note (Signed)
 S/p ablation in 1995

## 2024-03-10 ENCOUNTER — Encounter (HOSPITAL_COMMUNITY): Payer: Self-pay | Admitting: Family Medicine

## 2024-03-10 DIAGNOSIS — N201 Calculus of ureter: Secondary | ICD-10-CM | POA: Diagnosis present

## 2024-03-10 LAB — CBC
HCT: 33.4 % — ABNORMAL LOW (ref 36.0–46.0)
Hemoglobin: 10.5 g/dL — ABNORMAL LOW (ref 12.0–15.0)
MCH: 32 pg (ref 26.0–34.0)
MCHC: 31.4 g/dL (ref 30.0–36.0)
MCV: 101.8 fL — ABNORMAL HIGH (ref 80.0–100.0)
Platelets: 242 K/uL (ref 150–400)
RBC: 3.28 MIL/uL — ABNORMAL LOW (ref 3.87–5.11)
RDW: 14.8 % (ref 11.5–15.5)
WBC: 14.1 K/uL — ABNORMAL HIGH (ref 4.0–10.5)
nRBC: 0 % (ref 0.0–0.2)

## 2024-03-10 LAB — COMPREHENSIVE METABOLIC PANEL WITH GFR
ALT: 8 U/L (ref 0–44)
AST: 16 U/L (ref 15–41)
Albumin: 3.6 g/dL (ref 3.5–5.0)
Alkaline Phosphatase: 110 U/L (ref 38–126)
Anion gap: 12 (ref 5–15)
BUN: 9 mg/dL (ref 6–20)
CO2: 20 mmol/L — ABNORMAL LOW (ref 22–32)
Calcium: 8.6 mg/dL — ABNORMAL LOW (ref 8.9–10.3)
Chloride: 105 mmol/L (ref 98–111)
Creatinine, Ser: 0.72 mg/dL (ref 0.44–1.00)
GFR, Estimated: >60 mL/min (ref >60)
Glucose, Bld: 111 mg/dL — ABNORMAL HIGH (ref 70–99)
Potassium: 4.3 mmol/L (ref 3.5–5.1)
Sodium: 137 mmol/L (ref 135–145)
Total Bilirubin: 0.3 mg/dL (ref 0.0–1.2)
Total Protein: 5.8 g/dL — ABNORMAL LOW (ref 6.5–8.1)

## 2024-03-10 LAB — PROCALCITONIN: Procalcitonin: <0.1 ng/mL

## 2024-03-10 MED ORDER — LIDOCAINE VISCOUS HCL 2 % MT SOLN
15.0000 mL | Freq: Once | OROMUCOSAL | Status: AC
  Administered 2024-03-10: 15 mL via ORAL
  Filled 2024-03-10: qty 15

## 2024-03-10 MED ORDER — INFLUENZA VIRUS VACC SPLIT PF (FLUZONE) 0.5 ML IM SUSY: 0.5000 mL | PREFILLED_SYRINGE | INTRAMUSCULAR | Status: DC

## 2024-03-10 MED ORDER — SODIUM CHLORIDE 0.9 % IV SOLN
2.0000 g | INTRAVENOUS | Status: DC
  Administered 2024-03-11: 2 g via INTRAVENOUS
  Filled 2024-03-10: qty 20

## 2024-03-10 MED ORDER — DIPHENHYDRAMINE HCL 25 MG PO CAPS
25.0000 mg | ORAL_CAPSULE | Freq: Every day | ORAL | Status: DC
  Administered 2024-03-10: 25 mg via ORAL
  Filled 2024-03-10: qty 1

## 2024-03-10 MED ORDER — ALUM & MAG HYDROXIDE-SIMETH 200-200-20 MG/5ML PO SUSP
30.0000 mL | Freq: Once | ORAL | Status: AC
  Administered 2024-03-10: 30 mL via ORAL
  Filled 2024-03-10: qty 30

## 2024-03-10 NOTE — Progress Notes (Signed)
 PROGRESS NOTE  Ondria Oswald  FMW:[id number] DOB: 06-21-1965 DOA: 03/09/2024 PCP: Patient, No Pcp Per   Brief Narrative: Patient is a 58 year old female with history of Addison's disease, WPW syndrome, collagenous colitis with chronic diarrhea,  chronic back pain, anxiety, depression, tobacco use, PVD, Barrett's esophagus/esophagitis who presented with epigastric pain, vomiting.  On presentation she was mildly hypertensive.  Lab work showed leukocytosis.  UA suspicious for UTI.  CT abdomen/pelvis showed severe distal esophagitis with periesophageal fluid and edema without gas, left pyonephritis, left distal ureteral stone measuring 0.7 cm at the proximal margin of the left ureterovesical junction without hydronephrosis or hydroureter.  He started on ceftriaxone  for pyelonephritis.  Urology consulted.  Underwent cystoscopy, left ureteroscopy and stone basket extraction, left ureteral stent placement on 12/6.  Urine culture pending.  Assessment & Plan:  Principal Problem:   Left ureteral stone Active Problems:   Esophagitis   Leukocytosis   chronic diarrhea from collagenous colitis   Anxiety and depression   WOLFF (WOLFE)-PARKINSON-WHITE (WPW) SYNDROME   Tobacco abuse   Sepsis (HCC)   Left ureteral stone: Presented with abdominal pain radiating to the back, nausea, vomiting.  CT imaging showed left pyonephritis, left distal ureteral stone measuring 0.7 cm at the proximal margin of the left ureterovesical junction without hydronephrosis or hydroureter.  Urology consulted. Underwent cystoscopy, left ureteroscopy and stone basket extraction, left ureteral stent placement on 12/6.    UTI/leukocytosis/left pyelonephritis: UA suspicious for UTI.  Imaging suggestive of acute pyonephritis.  Continue ceftriaxone .  Follow blood culture, urine culture. Afebrile this morning.  Leukocytosis improving.  History of esophagitis: CT findings of esophagitis.  Patient had EGD at [facility] on August.  Continue  PPI, Carafate .  Recommended to continue follow-up with GI as an outpatient.  She is requesting GI cocktail today.  Chronic diarrhea from collagenous colitis: Stable  History of anxiety/depression: Lexapro   Tobacco abuse: Smokes half packs a day.  Declines nicotine  patch  WPW syndrome: Status post ablation in 1995         DVT prophylaxis:SCDs Start: 03/09/24 1323 Place and maintain sequential compression device Start: 03/09/24 1308     Code Status: Full Code  Family Communication: Discussed with daughter Bayley on phone on 12/7  Patient status:Obs  Patient is from :home  Anticipated discharge un:ynfz  Estimated DC date:tomorrow   Consultants: Urology  Procedures: Cystoscopy, ureteral stent placement  Antimicrobials:  Anti-infectives (From admission, onward)    Start     Dose/Rate Route Frequency Ordered Stop   03/10/24 1000  cefTRIAXone  (ROCEPHIN ) 1 g in sodium chloride  0.9 % 100 mL IVPB        1 g 200 mL/hr over 30 Minutes Intravenous Every 24 hours 03/09/24 1327     03/09/24 1800  cefTRIAXone  (ROCEPHIN ) 1 g in sodium chloride  0.9 % 100 mL IVPB        1 g 200 mL/hr over 30 Minutes Intravenous  Once 03/09/24 1346 03/10/24 0857   03/09/24 1315  cefTRIAXone  (ROCEPHIN ) 1 g in sodium chloride  0.9 % 100 mL IVPB  Status:  Discontinued        1 g 200 mL/hr over 30 Minutes Intravenous  Once 03/09/24 1307 03/09/24 1344   03/09/24 1200  cefTRIAXone  (ROCEPHIN ) 1 g in sodium chloride  0.9 % 100 mL IVPB        1 g 200 mL/hr over 30 Minutes Intravenous  Once 03/09/24 1159 03/09/24 1314       Subjective: Patient seen and examined at bedside today.  Hemodynamically  stable.  Lying in bed.  Complains of epigastric discomfort but no nausea or vomiting.  Difficulty with toleration of diet.  Long discussion held with the daughter Con on phone from bedside.  She acknowledges that there is no need for inpatient GI consultation for now.  She will follow-up with her GI as an  outpatient.  We discussed about continuing Carafate  and Protonix   Objective: Vitals:   03/09/24 2218 03/10/24 0223 03/10/24 0605 03/10/24 0909  BP: 121/71 (!) 169/94 135/83 138/79  Pulse: 92 97 86 83  Resp: 18 (!) 21 20 18   Temp: 99.2 F (37.3 C) 98 F (36.7 C) 98.1 F (36.7 C) 98.4 F (36.9 C)  TempSrc: Oral Oral Oral Oral  SpO2: 93% 98% 97% 97%  Weight:      Height:        Intake/Output Summary (Last 24 hours) at 03/10/2024 1016 Last data filed at 03/10/2024 0433 Gross per 24 hour  Intake 1865.82 ml  Output --  Net 1865.82 ml   Filed Weights   03/09/24 0822  Weight: 61.2 kg    Examination:  General exam: Overall comfortable, not in distress HEENT: PERRL Respiratory system:  no wheezes or crackles  Cardiovascular system: S1 & S2 heard, RRR.  Gastrointestinal system: Abdomen is nondistended, soft and mostly tender. Central nervous system: Alert and oriented Extremities: No edema, no clubbing ,no cyanosis Skin: No rashes, no ulcers,no icterus     Data Reviewed: I have personally reviewed following labs and imaging studies  CBC: Recent Labs  Lab 03/09/24 0957 03/09/24 1015 03/10/24 0717  WBC  --  19.3* 14.1*  NEUTROABS  --  17.1*  --   HGB 13.9 12.9 10.5*  HCT 41.0 39.6 33.4*  MCV  --  98.5 101.8*  PLT  --  319 242   Basic Metabolic Panel: Recent Labs  Lab 03/09/24 0957 03/09/24 1015 03/10/24 0531  NA 136 138 137  K 4.4 4.2 4.3  CL 102 101 105  CO2  --  24 20*  GLUCOSE 113* 110* 111*  BUN 9 9 9   CREATININE 0.70 0.59 0.72  CALCIUM   --  10.3 8.6*     No results found for this or any previous visit (from the past 240 hours).   Radiology Studies: DG C-Arm 1-60 Min-No Report Result Date: 03/09/2024 Fluoroscopy was utilized by the requesting physician.  No radiographic interpretation.   CT ABDOMEN PELVIS W CONTRAST Result Date: 03/09/2024 EXAM: CT ABDOMEN AND PELVIS WITH CONTRAST 03/09/2024 10:21:18 AM TECHNIQUE: CT of the abdomen and pelvis was  performed with the administration of 90 cc Omnipaque  300 intravenous contrast. Multiplanar reformatted images are provided for review. Automated exposure control, iterative reconstruction, and/or weight-based adjustment of the mA/kV was utilized to reduce the radiation dose to as low as reasonably achievable. COMPARISON: 12/22/2015 CLINICAL HISTORY: Epigastric pain with nausea and vomiting starting last night. FINDINGS: LOWER CHEST: Prominent distal esophageal wall thickening observed with periesophageal fluid/edema. The esophageal wall thickening extends cephalad to the topmost image. Although nonspecific, the appearance favors severe distal esophagitis. No extraluminal gas is present along the distal esophagus. LIVER: The liver is unremarkable. GALLBLADDER AND BILE DUCTS: Cholecystectomy. Common bile duct 8 mm in diameter, similar to 12/22/2015. SPLEEN: No acute abnormality. 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 No current substantial peripancreatic edema/stranding. ADRENAL GLANDS: No acute abnormality. KIDNEYS, URETERS AND BLADDER: 0.7 cm left distal ureteral stone along the proximal margin of the left ureterovesical  junction (UVJ), image 74 series 2. However, there is no left hydronephrosis or hydroureter. Hypoenhancing segments of the left kidney especially anterolaterally, without cortical rim sign, and accordingly compatible with left pyelonephritis. Urinary bladder is unremarkable. GI AND BOWEL: Stomach demonstrates no acute abnormality. Normal appendix. There is no bowel obstruction. PERITONEUM AND RETROPERITONEUM: No ascites. No free air. VASCULATURE: Mild aortoiliac atheromatous vascular calcification. LYMPH NODES: No lymphadenopathy. REPRODUCTIVE ORGANS: Uterus absent. BONES AND SOFT TISSUES: Degenerative facet arthropathy bilaterally at L4-L5. 3 mm grade 1 degenerative retrolisthesis at L2-L3. No focal soft tissue abnormality.  IMPRESSION: 1. Severe distal esophagitis with periesophageal fluid and edema, without extraluminal gas. 2. Left pyelonephritis. 3. Left distal ureteral stone measuring 0.7 cm at the proximal margin of the left ureterovesical junction without left hydronephrosis or hydroureter. 4. 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 5. Degenerative facet arthropathy at L4L5 and 3 mm grade 1 degenerative retrolisthesis at [facility]. 6. Several additional chronic and incidental findings are present, including cholecystectomy, common bile duct measuring 8 mm similar to prior, mild aortoiliac atheromatous vascular calcification, normal appendix, and absent uterus. Electronically signed by: Ryan Salvage MD 03/09/2024 10:37 AM EST RP Workstation: [id number]   DG Chest Port 1 View Result Date: 03/09/2024 EXAM: 1 VIEW(S) XRAY OF THE CHEST 03/09/2024 09:35:00 AM COMPARISON: 09/23/2023 CLINICAL HISTORY: acute chest pain and epigastric pain. FINDINGS: LUNGS AND PLEURA: No focal pulmonary opacity. No pleural effusion. No pneumothorax. HEART AND MEDIASTINUM: No acute abnormality of the cardiac and mediastinal silhouettes. BONES AND SOFT TISSUES: No acute osseous abnormality. IMPRESSION: 1. No acute cardiopulmonary process. Electronically signed by: Ryan Salvage MD 03/09/2024 10:21 AM EST RP Workstation: [id number]    Scheduled Meds:  escitalopram   20 mg Oral Daily   folic acid   1 mg Oral Daily   [START ON 03/11/2024] influenza vac split trivalent PF  0.5 mL Intramuscular Tomorrow-1000   multivitamin with minerals  1 tablet Oral Daily   pantoprazole  (PROTONIX ) IV  40 mg Intravenous Q12H   sucralfate   1 g Oral TID WC & HS   thiamine   100 mg Oral Daily   Continuous Infusions:  sodium chloride  100 mL/hr at 03/10/24 0432   cefTRIAXone  (ROCEPHIN )  IV       LOS: 0 days   Ivonne Mustache, MD [facility] P12/10/2023, 10:16 AM

## 2024-03-11 ENCOUNTER — Other Ambulatory Visit (HOSPITAL_COMMUNITY): Payer: Self-pay

## 2024-03-11 LAB — CBC
HCT: 33.5 % — ABNORMAL LOW (ref 36.0–46.0)
Hemoglobin: 10.2 g/dL — ABNORMAL LOW (ref 12.0–15.0)
MCH: 31.8 pg (ref 26.0–34.0)
MCHC: 30.4 g/dL (ref 30.0–36.0)
MCV: 104.4 fL — ABNORMAL HIGH (ref 80.0–100.0)
Platelets: 200 K/uL (ref 150–400)
RBC: 3.21 MIL/uL — ABNORMAL LOW (ref 3.87–5.11)
RDW: 14.9 % (ref 11.5–15.5)
WBC: 7.6 K/uL (ref 4.0–10.5)
nRBC: 0 % (ref 0.0–0.2)

## 2024-03-11 LAB — URINE CULTURE: Culture: 40000 — AB

## 2024-03-11 LAB — BASIC METABOLIC PANEL WITH GFR
Anion gap: 8 (ref 5–15)
BUN: 9 mg/dL (ref 6–20)
CO2: 24 mmol/L (ref 22–32)
Calcium: 8.4 mg/dL — ABNORMAL LOW (ref 8.9–10.3)
Chloride: 107 mmol/L (ref 98–111)
Creatinine, Ser: 0.69 mg/dL (ref 0.44–1.00)
GFR, Estimated: >60 mL/min (ref >60)
Glucose, Bld: 90 mg/dL (ref 70–99)
Potassium: 3.8 mmol/L (ref 3.5–5.1)
Sodium: 139 mmol/L (ref 135–145)

## 2024-03-11 MED ORDER — CIPROFLOXACIN HCL 500 MG PO TABS
500.0000 mg | ORAL_TABLET | Freq: Two times a day (BID) | ORAL | 0 refills | Status: AC
  Filled 2024-03-11: qty 10, 5d supply, fill #0

## 2024-03-11 MED ORDER — CIPROFLOXACIN HCL 500 MG PO TABS: 500.0000 mg | ORAL_TABLET | Freq: Two times a day (BID) | ORAL | Status: DC

## 2024-03-11 MED ORDER — SUCRALFATE 1 G PO TABS
1.0000 g | ORAL_TABLET | Freq: Three times a day (TID) | ORAL | 0 refills | Status: AC
  Filled 2024-03-11: qty 56, 14d supply, fill #0

## 2024-03-11 NOTE — TOC Transition Note (Addendum)
 Transition of Care [facility]) - Discharge Note   Patient Details  Name: Dawn Frost MRN: [id number] Date of Birth: December 07, 1965  [facility] CM/SW Contact:  Bascom Service, RN Phone Number: 03/11/2024, 11:29 AM   Clinical Narrative: d/c home no CM needs. Spoke to dtr Bayley-patient will contact her insurance for PCP in network. Has own transport home.      Final next level of care: Home/Self Care Barriers to Discharge: No Barriers Identified   Patient Goals and CMS Choice Patient states their goals for this hospitalization and ongoing recovery are:: Home CMS Medicare.gov Compare Post Acute Care list provided to:: Patient Choice offered to / list presented to : Patient [place] ownership interest in [facility].provided to:: Patient    Discharge Placement                       Discharge Plan and Services Additional resources added to the After Visit Summary for       Post Acute Care Choice: Resumption of Svcs/PTA Provider                               Social Drivers of Health (SDOH) Interventions SDOH Screenings   Food Insecurity: No Food Insecurity (03/09/2024)  Housing: Low Risk  (03/09/2024)  Transportation Needs: No Transportation Needs (03/09/2024)  Utilities: Not At Risk (03/09/2024)  Stress: No Stress Concern Present (11/28/2023)   Received from [facility]  Tobacco Use: High Risk (03/10/2024)     Readmission Risk Interventions     No data to display

## 2024-03-11 NOTE — Discharge Summary (Signed)
 Physician Discharge Summary  Dawn Frost FMW:[id number] DOB: 17-Apr-1965 DOA: 03/09/2024  PCP: Patient, No Pcp Per  Admit date: 03/09/2024 Discharge date: 03/11/2024  Admitted From: Home Disposition:  Home  Discharge Condition:Stable CODE STATUS:FULL Diet recommendation: Regular  Brief/Interim Summary: Patient is a 58 year old female with history of Addison's disease, WPW syndrome, collagenous colitis with chronic diarrhea,  chronic back pain, anxiety, depression, tobacco use, PVD, Barrett's esophagus/esophagitis who presented with epigastric pain, vomiting.  On presentation she was mildly hypertensive.  Lab work showed leukocytosis.  UA suspicious for UTI.  CT abdomen/pelvis showed severe distal esophagitis with periesophageal fluid and edema without gas, left pyonephritis, left distal ureteral stone measuring 0.7 cm at the proximal margin of the left ureterovesical junction without hydronephrosis or hydroureter.  He started on ceftriaxone  for pyelonephritis.  Urology consulted.  Underwent cystoscopy, left ureteroscopy and stone basket extraction, left ureteral stent placement on 12/6.  Urine culture showed E. coli.  Medically stable for discharge to home today.  Following problems were addressed during the hospitalization:  Left ureteral stone: Presented with abdominal pain radiating to the back, nausea, vomiting.  CT imaging showed left pyonephritis, left distal ureteral stone measuring 0.7 cm at the proximal margin of the left ureterovesical junction without hydronephrosis or hydroureter.  Urology consulted. Underwent cystoscopy, left ureteroscopy and stone basket extraction, left ureteral stent placement on 12/6.  Plan for removal of the ureteral stent today before discharge   UTI/leukocytosis/left pyelonephritis: UA was suspicious for UTI.  Imaging suggestive of acute pyonephritis.  Started ceftriaxone .Afebrile this morning.  Leukocytosis improved.  Urine culture showed pansensitive E.  coli.  Continue ciprofloxacin  for next 5 days.   History of esophagitis: CT with findings of esophagitis.  Patient had EGD at [facility] on August.  Continue PPI, Carafate .  Recommended to continue follow-up with GI as an outpatient.     Chronic diarrhea from collagenous colitis: Stable   History of anxiety/depression: On Lexapro    Tobacco abuse: Smokes half packs a day.  Counseled for cessation   WPW syndrome: Status post ablation in 1995   Discharge Diagnoses:  Principal Problem:   Left ureteral stone Active Problems:   Esophagitis   Leukocytosis   chronic diarrhea from collagenous colitis   Anxiety and depression   WOLFF (WOLFE)-PARKINSON-WHITE (WPW) SYNDROME   Tobacco abuse   Sepsis (HCC)    Discharge Instructions  Discharge Instructions     Diet - low sodium heart healthy   Complete by: As directed    Discharge instructions   Complete by: As directed    1)Please take your medications as instructed 2)Follow up with urology as an outpatient 3)Follow up with your gastroenterologist as an outpatient in 1 to 2 weeks. 4)Quit smoking   Increase activity slowly   Complete by: As directed       Allergies as of 03/11/2024       Reactions   Gabapentin Other (See Comments)   Severe confusion   Zolpidem  Other (See Comments)    Headaches   Amoxicillin Other (See Comments)   Unknown    Butrans [buprenorphine] Other (See Comments)   Burned skin   Nsaids Other (See Comments)   Bad for colitis        Medication List     TAKE these medications    Airsupra 90-80 MCG/ACT Aero Generic drug: Albuterol -Budesonide  Inhale 2 puffs into the lungs daily as needed (wheezing/sob).   albuterol  108 (90 Base) MCG/ACT inhaler Commonly known as: VENTOLIN  HFA Inhale 1-2 puffs into the lungs  every 6 (six) hours as needed for wheezing or shortness of breath.   ciprofloxacin  500 MG tablet Commonly known as: CIPRO  Take 1 tablet (500 mg total) by mouth 2 (two) times daily for 5  days.   cyanocobalamin  1000 MCG/ML injection Commonly known as: VITAMIN B12 Inject 1,000 mcg into the skin every 30 (thirty) days.   escitalopram  20 MG tablet Commonly known as: LEXAPRO  Take 20 mg by mouth daily.   lisdexamfetamine 50 MG capsule Commonly known as: VYVANSE  Take 50 mg by mouth daily.   multivitamin with minerals tablet Take 1 tablet by mouth daily.   pantoprazole  40 MG tablet Commonly known as: PROTONIX  Take 40 mg by mouth 2 (two) times daily.   promethazine  25 MG tablet Commonly known as: PHENERGAN  Take 25 mg by mouth 2 (two) times daily as needed for nausea or vomiting.   sucralfate  1 g tablet Commonly known as: CARAFATE  Take 1 tablet (1 g total) by mouth 4 (four) times daily -  with meals and at bedtime for 14 days.        Follow-up Information     Nieves Cough, MD. Call.   Specialty: Urology Why: for office visit to ensure left kidney heals properly from stone and infection. Contact information: [address] [place] [address] [phone]                Allergies  Allergen Reactions   Gabapentin Other (See Comments)    Severe confusion   Zolpidem  Other (See Comments)     Headaches   Amoxicillin Other (See Comments)    Unknown    Butrans [Buprenorphine] Other (See Comments)    Burned skin   Nsaids Other (See Comments)    Bad for colitis    Consultations: Urology   Procedures/Studies: DG C-Arm 1-60 Min-No Report Result Date: 03/09/2024 Fluoroscopy was utilized by the requesting physician.  No radiographic interpretation.   CT ABDOMEN PELVIS W CONTRAST Result Date: 03/09/2024 EXAM: CT ABDOMEN AND PELVIS WITH CONTRAST 03/09/2024 10:21:18 AM TECHNIQUE: CT of the abdomen and pelvis was performed with the administration of 90 cc Omnipaque  300 intravenous contrast. Multiplanar reformatted images are provided for review. Automated exposure control, iterative reconstruction, and/or weight-based adjustment of the mA/kV was  utilized to reduce the radiation dose to as low as reasonably achievable. COMPARISON: 12/22/2015 CLINICAL HISTORY: Epigastric pain with nausea and vomiting starting last night. FINDINGS: LOWER CHEST: Prominent distal esophageal wall thickening observed with periesophageal fluid/edema. The esophageal wall thickening extends cephalad to the topmost image. Although nonspecific, the appearance favors severe distal esophagitis. No extraluminal gas is present along the distal esophagus. LIVER: The liver is unremarkable. GALLBLADDER AND BILE DUCTS: Cholecystectomy. Common bile duct 8 mm in diameter, similar to 12/22/2015. SPLEEN: No acute abnormality. 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 No current substantial peripancreatic edema/stranding. ADRENAL GLANDS: No acute abnormality. KIDNEYS, URETERS AND BLADDER: 0.7 cm left distal ureteral stone along the proximal margin of the left ureterovesical junction (UVJ), image 74 series 2. However, there is no left hydronephrosis or hydroureter. Hypoenhancing segments of the left kidney especially anterolaterally, without cortical rim sign, and accordingly compatible with left pyelonephritis. Urinary bladder is unremarkable. GI AND BOWEL: Stomach demonstrates no acute abnormality. Normal appendix. There is no bowel obstruction. PERITONEUM AND RETROPERITONEUM: No ascites. No free air. VASCULATURE: Mild aortoiliac atheromatous vascular calcification. LYMPH NODES: No lymphadenopathy. REPRODUCTIVE ORGANS: Uterus absent. BONES AND SOFT TISSUES: Degenerative facet arthropathy bilaterally at L4-L5.  3 mm grade 1 degenerative retrolisthesis at L2-L3. No focal soft tissue abnormality. IMPRESSION: 1. Severe distal esophagitis with periesophageal fluid and edema, without extraluminal gas. 2. Left pyelonephritis. 3. Left distal ureteral stone measuring 0.7 cm at the proximal margin of the left ureterovesical junction  without left hydronephrosis or hydroureter. 4. 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 5. Degenerative facet arthropathy at L4L5 and 3 mm grade 1 degenerative retrolisthesis at [facility]. 6. Several additional chronic and incidental findings are present, including cholecystectomy, common bile duct measuring 8 mm similar to prior, mild aortoiliac atheromatous vascular calcification, normal appendix, and absent uterus. Electronically signed by: Ryan Salvage MD 03/09/2024 10:37 AM EST RP Workstation: [id number]   DG Chest Port 1 View Result Date: 03/09/2024 EXAM: 1 VIEW(S) XRAY OF THE CHEST 03/09/2024 09:35:00 AM COMPARISON: 09/23/2023 CLINICAL HISTORY: acute chest pain and epigastric pain. FINDINGS: LUNGS AND PLEURA: No focal pulmonary opacity. No pleural effusion. No pneumothorax. HEART AND MEDIASTINUM: No acute abnormality of the cardiac and mediastinal silhouettes. BONES AND SOFT TISSUES: No acute osseous abnormality. IMPRESSION: 1. No acute cardiopulmonary process. Electronically signed by: Ryan Salvage MD 03/09/2024 10:21 AM EST RP Workstation: [id number]      Subjective: Patient seen and examined at bedside today.  Hemodynamically stable.  Has chronic epigastric discomfort which is unchanged today.  Denies any nausea or vomiting.  She feels ready to go home.  I had a long discussion with her daughter yesterday on 12/7.  I offered to call her daughter again today to update about discharge planning, patient requested not to call  Discharge Exam: Vitals:   03/11/24 0700 03/11/24 0800  BP:    Pulse:    Resp: 16 20  Temp:    SpO2:     Vitals:   03/11/24 0600 03/11/24 0620 03/11/24 0700 03/11/24 0800  BP:  (!) 152/90    Pulse:  75    Resp: 13 18 16 20   Temp:  98.2 F (36.8 C)    TempSrc:  Oral    SpO2:  93%    Weight:      Height:        General: Pt is alert, awake, not in  acute distress Cardiovascular: RRR, S1/S2 +, no rubs, no gallops Respiratory: CTA bilaterally, no wheezing, no rhonchi Abdominal: Soft, NT, ND, bowel sounds + Extremities: no edema, no cyanosis    The results of significant diagnostics from this hospitalization (including imaging, microbiology, ancillary and laboratory) are listed below for reference.     Microbiology: Recent Results (from the past 240 hours)  Urine Culture     Status: Abnormal   Collection Time: 03/09/24 11:11 AM   Specimen: Urine, Random  Result Value Ref Range Status   Specimen Description   Final    URINE, RANDOM Performed at [facility], [address] [place], [place] [address]    Special Requests   Final    NONE Reflexed from [id number] Performed at [facility], [address], [place], [place] [address]    Culture (A)  Final    40,000 COLONIES/mL ESCHERICHIA COLI Two isolates with different morphologies were identified as the same organism.The most resistant organism was reported. Performed at [facility], [address] [place], [place] [address]    Report Status 03/11/2024 FINAL  Final   Organism ID, Bacteria ESCHERICHIA COLI (A)  Final      Susceptibility   Escherichia coli -  MIC*    AMPICILLIN >=32 RESISTANT Resistant     CEFAZOLIN  (URINE) Value in next row Sensitive      16 SENSITIVEThis is a modified FDA-approved test that has been validated and its performance characteristics determined by the reporting laboratory.  This laboratory is certified under the Clinical Laboratory Improvement Amendments CLIA as qualified to perform high complexity clinical laboratory testing.    CEFEPIME  Value in next row Sensitive      16 SENSITIVEThis is a modified FDA-approved test that has been validated and its performance characteristics determined by the reporting laboratory.  This laboratory is certified under the Clinical Laboratory Improvement Amendments CLIA as  qualified to perform high complexity clinical laboratory testing.    ERTAPENEM Value in next row Sensitive      16 SENSITIVEThis is a modified FDA-approved test that has been validated and its performance characteristics determined by the reporting laboratory.  This laboratory is certified under the Clinical Laboratory Improvement Amendments CLIA as qualified to perform high complexity clinical laboratory testing.    CEFTRIAXONE  Value in next row Sensitive      16 SENSITIVEThis is a modified FDA-approved test that has been validated and its performance characteristics determined by the reporting laboratory.  This laboratory is certified under the Clinical Laboratory Improvement Amendments CLIA as qualified to perform high complexity clinical laboratory testing.    CIPROFLOXACIN  Value in next row Sensitive      16 SENSITIVEThis is a modified FDA-approved test that has been validated and its performance characteristics determined by the reporting laboratory.  This laboratory is certified under the Clinical Laboratory Improvement Amendments CLIA as qualified to perform high complexity clinical laboratory testing.    GENTAMICIN Value in next row Sensitive      16 SENSITIVEThis is a modified FDA-approved test that has been validated and its performance characteristics determined by the reporting laboratory.  This laboratory is certified under the Clinical Laboratory Improvement Amendments CLIA as qualified to perform high complexity clinical laboratory testing.    NITROFURANTOIN Value in next row Sensitive      16 SENSITIVEThis is a modified FDA-approved test that has been validated and its performance characteristics determined by the reporting laboratory.  This laboratory is certified under the Clinical Laboratory Improvement Amendments CLIA as qualified to perform high complexity clinical laboratory testing.    TRIMETH /SULFA  Value in next row Sensitive      16 SENSITIVEThis is a modified FDA-approved test  that has been validated and its performance characteristics determined by the reporting laboratory.  This laboratory is certified under the Clinical Laboratory Improvement Amendments CLIA as qualified to perform high complexity clinical laboratory testing.    AMPICILLIN/SULBACTAM Value in next row Resistant      16 SENSITIVEThis is a modified FDA-approved test that has been validated and its performance characteristics determined by the reporting laboratory.  This laboratory is certified under the Clinical Laboratory Improvement Amendments CLIA as qualified to perform high complexity clinical laboratory testing.    PIP/TAZO Value in next row Intermediate      64 INTERMEDIATEThis is a modified FDA-approved test that has been validated and its performance characteristics determined by the reporting laboratory.  This laboratory is certified under the Clinical Laboratory Improvement Amendments CLIA as qualified to perform high complexity clinical laboratory testing.    MEROPENEM Value in next row Sensitive      64 INTERMEDIATEThis is a modified FDA-approved test that has been validated and its performance characteristics determined by the reporting laboratory.  This laboratory is  certified under the Clinical Laboratory Improvement Amendments CLIA as qualified to perform high complexity clinical laboratory testing.    * 40,000 COLONIES/mL ESCHERICHIA COLI  Culture, blood (Routine X 2) w Reflex to ID Panel     Status: None (Preliminary result)   Collection Time: 03/10/24 12:22 PM   Specimen: BLOOD LEFT ARM  Result Value Ref Range Status   Specimen Description   Final    BLOOD LEFT ARM BOTTLES DRAWN AEROBIC AND ANAEROBIC Performed at [facility], [address] [place], [place] [address]    Special Requests   Final    Blood Culture results may not be optimal due to an inadequate volume of blood received in culture bottles Performed at [facility], [address] [place], [place] [address]    Culture   Final    NO GROWTH < 24 HOURS Performed at [facility], [address] [place], [place] [address]    Report Status PENDING  Incomplete  Culture, blood (Routine X 2) w Reflex to ID Panel     Status: None (Preliminary result)   Collection Time: 03/10/24 12:32 PM   Specimen: BLOOD LEFT ARM  Result Value Ref Range Status   Specimen Description   Final    BLOOD LEFT ARM BOTTLES DRAWN AEROBIC ONLY Performed at [facility], [address] [place], [place] [address]    Special Requests   Final    Blood Culture results may not be optimal due to an inadequate volume of blood received in culture bottles Performed at [facility], [address], [place], [place] [address]    Culture   Final    NO GROWTH < 24 HOURS Performed at [facility], [address], [place], [place] [address]    Report Status PENDING  Incomplete     Labs: BNP (last 3 results) No results for input(s): BNP in the last 8760 hours. Basic Metabolic Panel: Recent Labs  Lab 03/09/24 0957 03/09/24 1015 03/10/24 0531 03/11/24 0507  NA 136 138 137 139  K 4.4 4.2 4.3 3.8  CL 102 101 105 107  CO2  --  24 20* 24  GLUCOSE 113* 110* 111* 90  BUN 9 9 9 9   CREATININE 0.70 0.59 0.72 0.69  CALCIUM   --  10.3 8.6* 8.4*   Liver Function Tests: Recent Labs  Lab 03/09/24 1015 03/10/24 0531  AST 20 16  ALT 12 8  ALKPHOS 144* 110  BILITOT 0.4 0.3  PROT 7.2 5.8*  ALBUMIN 4.4 3.6   Recent Labs  Lab 03/09/24 1015  LIPASE 14   No results for input(s): AMMONIA in the last 168 hours. CBC: Recent Labs  Lab 03/09/24 0957 03/09/24 1015 03/10/24 0717 03/11/24 0507  WBC  --  19.3* 14.1* 7.6  NEUTROABS  --  17.1*  --   --   HGB 13.9 12.9 10.5* 10.2*  HCT 41.0 39.6 33.4* 33.5*  MCV  --  98.5 101.8* 104.4*  PLT  --  319 242 200   Cardiac Enzymes: No results for input(s): CKTOTAL, CKMB, CKMBINDEX, TROPONINI in  the last 168 hours. BNP: Invalid input(s): POCBNP CBG: No results for input(s): GLUCAP in the last 168 hours. D-Dimer No results for input(s): DDIMER in the last 72 hours. Hgb A1c No results for input(s): HGBA1C in the last 72 hours. Lipid Profile No results for input(s): CHOL, HDL, LDLCALC, TRIG, CHOLHDL, LDLDIRECT in the last 72 hours. Thyroid   function studies No results for input(s): TSH, T4TOTAL, T3FREE, THYROIDAB in the last 72 hours.  Invalid input(s): FREET3 Anemia work up No results for input(s): VITAMINB12, FOLATE, FERRITIN, TIBC, IRON, RETICCTPCT in the last 72 hours. Urinalysis    Component Value Date/Time   COLORURINE YELLOW 03/09/2024 1111   APPEARANCEUR CLOUDY (A) 03/09/2024 1111   LABSPEC 1.045 (H) 03/09/2024 1111   PHURINE 8.0 03/09/2024 1111   GLUCOSEU NEGATIVE 03/09/2024 1111   HGBUR NEGATIVE 03/09/2024 1111   BILIRUBINUR NEGATIVE 03/09/2024 1111   BILIRUBINUR negative 02/21/2024 1354   KETONESUR NEGATIVE 03/09/2024 1111   PROTEINUR NEGATIVE 03/09/2024 1111   UROBILINOGEN 0.2 02/21/2024 1354   UROBILINOGEN 0.2 11/12/2014 2030   NITRITE NEGATIVE 03/09/2024 1111   LEUKOCYTESUR LARGE (A) 03/09/2024 1111   Sepsis Labs Recent Labs  Lab 03/09/24 1015 03/10/24 0717 03/11/24 0507  WBC 19.3* 14.1* 7.6   Microbiology Recent Results (from the past 240 hours)  Urine Culture     Status: Abnormal   Collection Time: 03/09/24 11:11 AM   Specimen: Urine, Random  Result Value Ref Range Status   Specimen Description   Final    URINE, RANDOM Performed at [facility], [address] [place], [place] [address]    Special Requests   Final    NONE Reflexed from [id number] Performed at [facility], [address] [place], [place] [address]    Culture (A)  Final    40,000 COLONIES/mL ESCHERICHIA COLI Two isolates with different morphologies were identified as the same organism.The most  resistant organism was reported. Performed at [facility], [address] [place], [place] [address]    Report Status 03/11/2024 FINAL  Final   Organism ID, Bacteria ESCHERICHIA COLI (A)  Final      Susceptibility   Escherichia coli - MIC*    AMPICILLIN >=32 RESISTANT Resistant     CEFAZOLIN  (URINE) Value in next row Sensitive      16 SENSITIVEThis is a modified FDA-approved test that has been validated and its performance characteristics determined by the reporting laboratory.  This laboratory is certified under the Clinical Laboratory Improvement Amendments CLIA as qualified to perform high complexity clinical laboratory testing.    CEFEPIME  Value in next row Sensitive      16 SENSITIVEThis is a modified FDA-approved test that has been validated and its performance characteristics determined by the reporting laboratory.  This laboratory is certified under the Clinical Laboratory Improvement Amendments CLIA as qualified to perform high complexity clinical laboratory testing.    ERTAPENEM Value in next row Sensitive      16 SENSITIVEThis is a modified FDA-approved test that has been validated and its performance characteristics determined by the reporting laboratory.  This laboratory is certified under the Clinical Laboratory Improvement Amendments CLIA as qualified to perform high complexity clinical laboratory testing.    CEFTRIAXONE  Value in next row Sensitive      16 SENSITIVEThis is a modified FDA-approved test that has been validated and its performance characteristics determined by the reporting laboratory.  This laboratory is certified under the Clinical Laboratory Improvement Amendments CLIA as qualified to perform high complexity clinical laboratory testing.    CIPROFLOXACIN  Value in next row Sensitive      16 SENSITIVEThis is a modified FDA-approved test that has been validated and its performance characteristics determined by the reporting laboratory.  This laboratory is  certified under the Clinical Laboratory Improvement Amendments CLIA as qualified to perform high complexity clinical laboratory testing.  GENTAMICIN Value in next row Sensitive      16 SENSITIVEThis is a modified FDA-approved test that has been validated and its performance characteristics determined by the reporting laboratory.  This laboratory is certified under the Clinical Laboratory Improvement Amendments CLIA as qualified to perform high complexity clinical laboratory testing.    NITROFURANTOIN Value in next row Sensitive      16 SENSITIVEThis is a modified FDA-approved test that has been validated and its performance characteristics determined by the reporting laboratory.  This laboratory is certified under the Clinical Laboratory Improvement Amendments CLIA as qualified to perform high complexity clinical laboratory testing.    TRIMETH /SULFA  Value in next row Sensitive      16 SENSITIVEThis is a modified FDA-approved test that has been validated and its performance characteristics determined by the reporting laboratory.  This laboratory is certified under the Clinical Laboratory Improvement Amendments CLIA as qualified to perform high complexity clinical laboratory testing.    AMPICILLIN/SULBACTAM Value in next row Resistant      16 SENSITIVEThis is a modified FDA-approved test that has been validated and its performance characteristics determined by the reporting laboratory.  This laboratory is certified under the Clinical Laboratory Improvement Amendments CLIA as qualified to perform high complexity clinical laboratory testing.    PIP/TAZO Value in next row Intermediate      64 INTERMEDIATEThis is a modified FDA-approved test that has been validated and its performance characteristics determined by the reporting laboratory.  This laboratory is certified under the Clinical Laboratory Improvement Amendments CLIA as qualified to perform high complexity clinical laboratory testing.    MEROPENEM  Value in next row Sensitive      64 INTERMEDIATEThis is a modified FDA-approved test that has been validated and its performance characteristics determined by the reporting laboratory.  This laboratory is certified under the Clinical Laboratory Improvement Amendments CLIA as qualified to perform high complexity clinical laboratory testing.    * 40,000 COLONIES/mL ESCHERICHIA COLI  Culture, blood (Routine X 2) w Reflex to ID Panel     Status: None (Preliminary result)   Collection Time: 03/10/24 12:22 PM   Specimen: BLOOD LEFT ARM  Result Value Ref Range Status   Specimen Description   Final    BLOOD LEFT ARM BOTTLES DRAWN AEROBIC AND ANAEROBIC Performed at [facility], [address], [place], [place] [address]    Special Requests   Final    Blood Culture results may not be optimal due to an inadequate volume of blood received in culture bottles Performed at [facility], [address] [place], [place] [address]    Culture   Final    NO GROWTH < 24 HOURS Performed at [facility], [address] [place], [place] [address]    Report Status PENDING  Incomplete  Culture, blood (Routine X 2) w Reflex to ID Panel     Status: None (Preliminary result)   Collection Time: 03/10/24 12:32 PM   Specimen: BLOOD LEFT ARM  Result Value Ref Range Status   Specimen Description   Final    BLOOD LEFT ARM BOTTLES DRAWN AEROBIC ONLY Performed at [facility], [address] [place], [place] [address]    Special Requests   Final    Blood Culture results may not be optimal due to an inadequate volume of blood received in culture bottles Performed at [facility], [address], [place], [place] [address]    Culture   Final  NO GROWTH < 24 HOURS Performed at [facility], [address], [place], [place] [address]    Report Status PENDING  Incomplete    Please note: You were cared for by a hospitalist  during your hospital stay. Once you are discharged, your primary care physician will handle any further medical issues. Please note that NO REFILLS for any discharge medications will be authorized once you are discharged, as it is imperative that you return to your primary care physician (or establish a relationship with a primary care physician if you do not have one) for your post hospital discharge needs so that they can reassess your need for medications and monitor your lab values.    Time coordinating discharge: 40 minutes  SIGNED:   Ivonne Mustache, MD  [facility] 03/11/2024, 10:52 AM Pager [phone]  If 7PM-7AM, please contact night-coverage [email or web address] Password [email or web address]

## 2024-03-11 NOTE — Progress Notes (Signed)
 Discharge medications delivered to patient at the bedside.

## 2024-03-15 LAB — CULTURE, BLOOD (ROUTINE X 2)
Culture: NO GROWTH
Culture: NO GROWTH
